# Patient Record
Sex: Female | Born: 1935 | Race: White | Hispanic: No | Smoking: Never smoker
Health system: Southern US, Community
[De-identification: ages and names within clinical notes are randomized; demographics above are authoritative.]

## PROBLEM LIST (undated history)

## (undated) DIAGNOSIS — K219 Gastro-esophageal reflux disease without esophagitis: Secondary | ICD-10-CM

## (undated) DIAGNOSIS — E785 Hyperlipidemia, unspecified: Secondary | ICD-10-CM

## (undated) DIAGNOSIS — I10 Essential (primary) hypertension: Secondary | ICD-10-CM

## (undated) DIAGNOSIS — K222 Esophageal obstruction: Secondary | ICD-10-CM

## (undated) DIAGNOSIS — Z5189 Encounter for other specified aftercare: Secondary | ICD-10-CM

## (undated) DIAGNOSIS — K5792 Diverticulitis of intestine, part unspecified, without perforation or abscess without bleeding: Secondary | ICD-10-CM

## (undated) DIAGNOSIS — E538 Deficiency of other specified B group vitamins: Secondary | ICD-10-CM

## (undated) DIAGNOSIS — B029 Zoster without complications: Secondary | ICD-10-CM

## (undated) DIAGNOSIS — I639 Cerebral infarction, unspecified: Secondary | ICD-10-CM

## (undated) HISTORY — DX: Cerebral infarction, unspecified: I63.9

## (undated) HISTORY — DX: Gastro-esophageal reflux disease without esophagitis: K21.9

## (undated) HISTORY — DX: Diverticulitis of intestine, part unspecified, without perforation or abscess without bleeding: K57.92

## (undated) HISTORY — DX: Deficiency of other specified B group vitamins: E53.8

## (undated) HISTORY — PX: APPENDECTOMY: SHX54

## (undated) HISTORY — PX: ABDOMINAL HYSTERECTOMY: SHX81

## (undated) HISTORY — DX: Esophageal obstruction: K22.2

## (undated) HISTORY — PX: TONSILLECTOMY: SUR1361

## (undated) HISTORY — DX: Zoster without complications: B02.9

## (undated) HISTORY — DX: Essential (primary) hypertension: I10

## (undated) HISTORY — DX: Hyperlipidemia, unspecified: E78.5

## (undated) HISTORY — PX: EYE SURGERY: SHX253

---

## 1998-05-13 ENCOUNTER — Ambulatory Visit (HOSPITAL_COMMUNITY): Admission: RE | Admit: 1998-05-13 | Discharge: 1998-05-13 | Payer: Self-pay | Admitting: Internal Medicine

## 1998-05-18 ENCOUNTER — Ambulatory Visit (HOSPITAL_COMMUNITY): Admission: RE | Admit: 1998-05-18 | Discharge: 1998-05-18 | Payer: Self-pay | Admitting: Internal Medicine

## 1999-08-29 ENCOUNTER — Encounter: Payer: Self-pay | Admitting: Internal Medicine

## 1999-08-29 ENCOUNTER — Encounter: Admission: RE | Admit: 1999-08-29 | Discharge: 1999-08-29 | Payer: Self-pay | Admitting: Internal Medicine

## 1999-10-10 ENCOUNTER — Encounter: Payer: Self-pay | Admitting: Internal Medicine

## 1999-10-10 ENCOUNTER — Encounter: Admission: RE | Admit: 1999-10-10 | Discharge: 1999-10-10 | Payer: Self-pay | Admitting: *Deleted

## 2004-06-13 ENCOUNTER — Ambulatory Visit: Payer: Self-pay | Admitting: Family Medicine

## 2004-06-21 ENCOUNTER — Ambulatory Visit: Payer: Self-pay | Admitting: Family Medicine

## 2004-08-09 ENCOUNTER — Ambulatory Visit: Payer: Self-pay | Admitting: Gastroenterology

## 2004-08-18 ENCOUNTER — Ambulatory Visit: Payer: Self-pay | Admitting: Gastroenterology

## 2004-08-29 ENCOUNTER — Ambulatory Visit: Payer: Self-pay | Admitting: Gastroenterology

## 2004-09-07 ENCOUNTER — Ambulatory Visit: Payer: Self-pay | Admitting: Family Medicine

## 2004-09-16 ENCOUNTER — Ambulatory Visit: Payer: Self-pay | Admitting: Family Medicine

## 2004-09-16 ENCOUNTER — Emergency Department (HOSPITAL_COMMUNITY): Admission: EM | Admit: 2004-09-16 | Discharge: 2004-09-16 | Payer: Self-pay | Admitting: *Deleted

## 2004-09-27 ENCOUNTER — Ambulatory Visit: Payer: Self-pay | Admitting: Gastroenterology

## 2004-09-28 HISTORY — PX: ESOPHAGOGASTRODUODENOSCOPY: SHX1529

## 2004-09-30 ENCOUNTER — Ambulatory Visit: Payer: Self-pay | Admitting: Gastroenterology

## 2004-10-03 ENCOUNTER — Ambulatory Visit: Payer: Self-pay | Admitting: Family Medicine

## 2005-01-09 ENCOUNTER — Emergency Department (HOSPITAL_COMMUNITY): Admission: EM | Admit: 2005-01-09 | Discharge: 2005-01-09 | Payer: Self-pay | Admitting: Emergency Medicine

## 2005-05-24 ENCOUNTER — Ambulatory Visit: Payer: Self-pay | Admitting: Family Medicine

## 2006-04-23 ENCOUNTER — Ambulatory Visit: Payer: Self-pay | Admitting: Family Medicine

## 2006-05-28 ENCOUNTER — Ambulatory Visit: Payer: Self-pay | Admitting: Family Medicine

## 2006-05-28 LAB — CONVERTED CEMR LAB
ALT: 33 units/L (ref 0–40)
AST: 29 units/L (ref 0–37)
Albumin: 3.9 g/dL (ref 3.5–5.2)
Alkaline Phosphatase: 104 units/L (ref 39–117)
BUN: 14 mg/dL (ref 6–23)
Bilirubin, Direct: 0.1 mg/dL (ref 0.0–0.3)
CO2: 32 meq/L (ref 19–32)
Calcium: 9.4 mg/dL (ref 8.4–10.5)
Chloride: 102 meq/L (ref 96–112)
Chol/HDL Ratio, serum: 2.8
Cholesterol: 209 mg/dL (ref 0–200)
Creatinine, Ser: 0.8 mg/dL (ref 0.4–1.2)
GFR calc non Af Amer: 73 mL/min
Glomerular Filtration Rate, Af Am: 89 mL/min/{1.73_m2}
Glucose, Bld: 104 mg/dL — ABNORMAL HIGH (ref 70–99)
HCT: 43.9 % (ref 36.0–46.0)
HDL: 74.4 mg/dL (ref >39.0)
Hemoglobin: 14.5 g/dL (ref 12.0–15.0)
LDL DIRECT: 98.3 mg/dL
MCHC: 33.1 g/dL (ref 30.0–36.0)
MCV: 92.8 fL (ref 78.0–100.0)
Platelets: 238 10*3/uL (ref 150–400)
Potassium: 3.6 meq/L (ref 3.5–5.1)
RBC: 4.74 M/uL (ref 3.87–5.11)
RDW: 11.8 % (ref 11.5–14.6)
Sodium: 142 meq/L (ref 135–145)
TSH: 0.85 microintl units/mL (ref 0.35–5.50)
Total Bilirubin: 1 mg/dL (ref 0.3–1.2)
Total Protein: 7.4 g/dL (ref 6.0–8.3)
Triglyceride fasting, serum: 92 mg/dL (ref 0–149)
VLDL: 18 mg/dL (ref 0–40)
WBC: 6.7 10*3/uL (ref 4.5–10.5)

## 2006-11-07 ENCOUNTER — Ambulatory Visit: Payer: Self-pay | Admitting: Family Medicine

## 2007-03-19 DIAGNOSIS — K219 Gastro-esophageal reflux disease without esophagitis: Secondary | ICD-10-CM | POA: Insufficient documentation

## 2007-03-19 DIAGNOSIS — I10 Essential (primary) hypertension: Secondary | ICD-10-CM | POA: Insufficient documentation

## 2007-05-20 ENCOUNTER — Encounter: Payer: Self-pay | Admitting: Family Medicine

## 2007-05-30 ENCOUNTER — Ambulatory Visit: Payer: Self-pay | Admitting: Family Medicine

## 2007-05-30 DIAGNOSIS — L989 Disorder of the skin and subcutaneous tissue, unspecified: Secondary | ICD-10-CM | POA: Insufficient documentation

## 2007-05-30 DIAGNOSIS — E785 Hyperlipidemia, unspecified: Secondary | ICD-10-CM | POA: Insufficient documentation

## 2007-06-03 LAB — CONVERTED CEMR LAB
ALT: 29 units/L (ref 0–35)
AST: 26 units/L (ref 0–37)
Albumin: 3.9 g/dL (ref 3.5–5.2)
Alkaline Phosphatase: 89 units/L (ref 39–117)
BUN: 14 mg/dL (ref 6–23)
Basophils Absolute: 0.1 10*3/uL (ref 0.0–0.1)
Basophils Relative: 0.8 % (ref 0.0–1.0)
Bilirubin, Direct: 0.2 mg/dL (ref 0.0–0.3)
CO2: 33 meq/L — ABNORMAL HIGH (ref 19–32)
Calcium: 10 mg/dL (ref 8.4–10.5)
Chloride: 107 meq/L (ref 96–112)
Cholesterol: 200 mg/dL (ref 0–200)
Creatinine, Ser: 0.8 mg/dL (ref 0.4–1.2)
Eosinophils Absolute: 0.1 10*3/uL (ref 0.0–0.6)
Eosinophils Relative: 1.3 % (ref 0.0–5.0)
GFR calc Af Amer: 88 mL/min
GFR calc non Af Amer: 73 mL/min
Glucose, Bld: 110 mg/dL — ABNORMAL HIGH (ref 70–99)
HCT: 39.5 % (ref 36.0–46.0)
HDL: 70.5 mg/dL (ref >39.0)
Hemoglobin: 13.6 g/dL (ref 12.0–15.0)
LDL Cholesterol: 105 mg/dL — ABNORMAL HIGH (ref 0–99)
Lymphocytes Relative: 22 % (ref 12.0–46.0)
MCHC: 34.5 g/dL (ref 30.0–36.0)
MCV: 92.6 fL (ref 78.0–100.0)
Monocytes Absolute: 0.6 10*3/uL (ref 0.2–0.7)
Monocytes Relative: 9.2 % (ref 3.0–11.0)
Neutro Abs: 4.6 10*3/uL (ref 1.4–7.7)
Neutrophils Relative %: 66.7 % (ref 43.0–77.0)
Platelets: 225 10*3/uL (ref 150–400)
Potassium: 5 meq/L (ref 3.5–5.1)
RBC: 4.27 M/uL (ref 3.87–5.11)
RDW: 11.8 % (ref 11.5–14.6)
Sodium: 147 meq/L — ABNORMAL HIGH (ref 135–145)
TSH: 1.54 microintl units/mL (ref 0.35–5.50)
Total Bilirubin: 1.2 mg/dL (ref 0.3–1.2)
Total CHOL/HDL Ratio: 2.8
Total Protein: 6.8 g/dL (ref 6.0–8.3)
Triglycerides: 122 mg/dL (ref 0–149)
VLDL: 24 mg/dL (ref 0–40)
WBC: 6.9 10*3/uL (ref 4.5–10.5)

## 2008-02-03 ENCOUNTER — Encounter: Payer: Self-pay | Admitting: Family Medicine

## 2008-02-25 ENCOUNTER — Ambulatory Visit: Payer: Self-pay | Admitting: Family Medicine

## 2008-03-12 ENCOUNTER — Ambulatory Visit: Payer: Self-pay | Admitting: Family Medicine

## 2008-03-12 DIAGNOSIS — N3000 Acute cystitis without hematuria: Secondary | ICD-10-CM | POA: Insufficient documentation

## 2008-03-12 LAB — CONVERTED CEMR LAB
Bilirubin Urine: NEGATIVE
Blood in Urine, dipstick: NEGATIVE
Glucose, Urine, Semiquant: NEGATIVE
Ketones, urine, test strip: NEGATIVE
Nitrite: NEGATIVE
Protein, U semiquant: NEGATIVE
Specific Gravity, Urine: 1.01
Urobilinogen, UA: NEGATIVE
pH: 5

## 2008-03-23 ENCOUNTER — Telehealth: Payer: Self-pay | Admitting: Family Medicine

## 2008-04-08 ENCOUNTER — Ambulatory Visit: Payer: Self-pay | Admitting: Family Medicine

## 2008-05-06 ENCOUNTER — Telehealth: Payer: Self-pay | Admitting: Family Medicine

## 2008-05-21 ENCOUNTER — Encounter: Payer: Self-pay | Admitting: Family Medicine

## 2008-06-24 ENCOUNTER — Ambulatory Visit: Payer: Self-pay | Admitting: Family Medicine

## 2008-06-24 DIAGNOSIS — K5900 Constipation, unspecified: Secondary | ICD-10-CM | POA: Insufficient documentation

## 2008-06-26 ENCOUNTER — Telehealth (INDEPENDENT_AMBULATORY_CARE_PROVIDER_SITE_OTHER): Payer: Self-pay

## 2008-11-04 ENCOUNTER — Ambulatory Visit: Payer: Self-pay | Admitting: Family Medicine

## 2008-11-04 DIAGNOSIS — IMO0001 Reserved for inherently not codable concepts without codable children: Secondary | ICD-10-CM | POA: Insufficient documentation

## 2008-11-04 DIAGNOSIS — R310 Gross hematuria: Secondary | ICD-10-CM | POA: Insufficient documentation

## 2008-11-04 LAB — CONVERTED CEMR LAB
Bilirubin Urine: NEGATIVE
Blood in Urine, dipstick: NEGATIVE
Glucose, Urine, Semiquant: NEGATIVE
Ketones, urine, test strip: NEGATIVE
Nitrite: NEGATIVE
Protein, U semiquant: NEGATIVE
Specific Gravity, Urine: 1.01
Urobilinogen, UA: 0.2
WBC Urine, dipstick: NEGATIVE
pH: 5

## 2008-11-30 DIAGNOSIS — K5730 Diverticulosis of large intestine without perforation or abscess without bleeding: Secondary | ICD-10-CM | POA: Insufficient documentation

## 2008-11-30 DIAGNOSIS — K222 Esophageal obstruction: Secondary | ICD-10-CM | POA: Insufficient documentation

## 2008-12-03 ENCOUNTER — Ambulatory Visit: Payer: Self-pay | Admitting: Gastroenterology

## 2008-12-03 DIAGNOSIS — K644 Residual hemorrhoidal skin tags: Secondary | ICD-10-CM | POA: Insufficient documentation

## 2008-12-04 ENCOUNTER — Ambulatory Visit: Payer: Self-pay | Admitting: Family Medicine

## 2008-12-04 ENCOUNTER — Telehealth: Payer: Self-pay | Admitting: Gastroenterology

## 2008-12-09 ENCOUNTER — Ambulatory Visit: Payer: Self-pay | Admitting: Gastroenterology

## 2008-12-09 HISTORY — PX: COLONOSCOPY: SHX174

## 2008-12-10 ENCOUNTER — Ambulatory Visit (HOSPITAL_COMMUNITY): Admission: RE | Admit: 2008-12-10 | Discharge: 2008-12-10 | Payer: Self-pay | Admitting: Gastroenterology

## 2008-12-31 ENCOUNTER — Encounter: Payer: Self-pay | Admitting: Gastroenterology

## 2009-02-25 ENCOUNTER — Encounter: Payer: Self-pay | Admitting: Gastroenterology

## 2009-02-25 ENCOUNTER — Encounter: Payer: Self-pay | Admitting: Family Medicine

## 2009-04-08 ENCOUNTER — Encounter: Payer: Self-pay | Admitting: Gastroenterology

## 2009-04-08 ENCOUNTER — Encounter: Payer: Self-pay | Admitting: Family Medicine

## 2009-05-24 ENCOUNTER — Encounter: Payer: Self-pay | Admitting: Family Medicine

## 2009-06-28 ENCOUNTER — Ambulatory Visit: Payer: Self-pay | Admitting: Family Medicine

## 2009-06-28 DIAGNOSIS — G609 Hereditary and idiopathic neuropathy, unspecified: Secondary | ICD-10-CM | POA: Insufficient documentation

## 2009-06-28 DIAGNOSIS — F411 Generalized anxiety disorder: Secondary | ICD-10-CM | POA: Insufficient documentation

## 2009-06-30 ENCOUNTER — Ambulatory Visit: Payer: Self-pay | Admitting: Family Medicine

## 2009-06-30 DIAGNOSIS — E538 Deficiency of other specified B group vitamins: Secondary | ICD-10-CM | POA: Insufficient documentation

## 2009-07-08 ENCOUNTER — Ambulatory Visit: Payer: Self-pay | Admitting: Family Medicine

## 2009-07-10 ENCOUNTER — Ambulatory Visit: Payer: Self-pay | Admitting: Cardiovascular Disease

## 2009-07-10 ENCOUNTER — Inpatient Hospital Stay (HOSPITAL_COMMUNITY): Admission: EM | Admit: 2009-07-10 | Discharge: 2009-07-13 | Payer: Self-pay | Admitting: Emergency Medicine

## 2009-07-11 ENCOUNTER — Encounter (INDEPENDENT_AMBULATORY_CARE_PROVIDER_SITE_OTHER): Payer: Self-pay | Admitting: Internal Medicine

## 2009-07-12 ENCOUNTER — Encounter (INDEPENDENT_AMBULATORY_CARE_PROVIDER_SITE_OTHER): Payer: Self-pay | Admitting: Internal Medicine

## 2009-07-19 ENCOUNTER — Ambulatory Visit: Payer: Self-pay | Admitting: Family Medicine

## 2009-07-19 DIAGNOSIS — I6350 Cerebral infarction due to unspecified occlusion or stenosis of unspecified cerebral artery: Secondary | ICD-10-CM | POA: Insufficient documentation

## 2009-07-19 DIAGNOSIS — N289 Disorder of kidney and ureter, unspecified: Secondary | ICD-10-CM | POA: Insufficient documentation

## 2009-07-19 DIAGNOSIS — N259 Disorder resulting from impaired renal tubular function, unspecified: Secondary | ICD-10-CM | POA: Insufficient documentation

## 2009-07-19 DIAGNOSIS — M353 Polymyalgia rheumatica: Secondary | ICD-10-CM | POA: Insufficient documentation

## 2009-07-20 ENCOUNTER — Encounter: Payer: Self-pay | Admitting: Family Medicine

## 2009-07-20 LAB — CONVERTED CEMR LAB
BUN: 25 mg/dL — ABNORMAL HIGH (ref 6–23)
CO2: 31 meq/L (ref 19–32)
Calcium: 10 mg/dL (ref 8.4–10.5)
Chloride: 96 meq/L (ref 96–112)
Creatinine, Ser: 1.1 mg/dL (ref 0.4–1.2)
GFR calc non Af Amer: 50.18 mL/min (ref >60)
Glucose, Bld: 103 mg/dL — ABNORMAL HIGH (ref 70–99)
Potassium: 5.3 meq/L — ABNORMAL HIGH (ref 3.5–5.1)
Sodium: 136 meq/L (ref 135–145)

## 2009-07-21 ENCOUNTER — Ambulatory Visit: Payer: Self-pay | Admitting: Vascular Surgery

## 2009-08-17 ENCOUNTER — Telehealth: Payer: Self-pay | Admitting: Family Medicine

## 2009-08-31 ENCOUNTER — Ambulatory Visit: Payer: Self-pay | Admitting: Family Medicine

## 2009-08-31 LAB — CONVERTED CEMR LAB
Bilirubin Urine: NEGATIVE
Blood in Urine, dipstick: NEGATIVE
Glucose, Urine, Semiquant: NEGATIVE
Ketones, urine, test strip: NEGATIVE
Nitrite: NEGATIVE
Protein, U semiquant: NEGATIVE
Specific Gravity, Urine: <1.005
Urobilinogen, UA: 0.2
pH: 5

## 2009-09-01 ENCOUNTER — Encounter: Payer: Self-pay | Admitting: Family Medicine

## 2009-09-27 ENCOUNTER — Encounter: Payer: Self-pay | Admitting: Family Medicine

## 2009-09-28 ENCOUNTER — Encounter: Payer: Self-pay | Admitting: Family Medicine

## 2009-09-29 ENCOUNTER — Telehealth: Payer: Self-pay | Admitting: Family Medicine

## 2009-10-10 ENCOUNTER — Encounter: Payer: Self-pay | Admitting: Family Medicine

## 2009-11-10 ENCOUNTER — Telehealth: Payer: Self-pay | Admitting: Family Medicine

## 2009-12-01 ENCOUNTER — Telehealth: Payer: Self-pay | Admitting: Family Medicine

## 2010-01-27 ENCOUNTER — Telehealth: Payer: Self-pay | Admitting: Family Medicine

## 2010-05-11 ENCOUNTER — Telehealth: Payer: Self-pay | Admitting: Family Medicine

## 2010-05-25 ENCOUNTER — Encounter: Payer: Self-pay | Admitting: Family Medicine

## 2010-06-30 ENCOUNTER — Encounter: Payer: Self-pay | Admitting: Family Medicine

## 2010-06-30 ENCOUNTER — Ambulatory Visit: Payer: Self-pay | Admitting: Family Medicine

## 2010-06-30 DIAGNOSIS — R7309 Other abnormal glucose: Secondary | ICD-10-CM | POA: Insufficient documentation

## 2010-07-01 LAB — CONVERTED CEMR LAB
ALT: 14 units/L (ref 0–35)
AST: 20 units/L (ref 0–37)
Albumin: 4 g/dL (ref 3.5–5.2)
Alkaline Phosphatase: 85 units/L (ref 39–117)
BUN: 13 mg/dL (ref 6–23)
Basophils Absolute: 0 10*3/uL (ref 0.0–0.1)
Basophils Relative: 0.7 % (ref 0.0–3.0)
Bilirubin, Direct: 0.1 mg/dL (ref 0.0–0.3)
CO2: 31 meq/L (ref 19–32)
Calcium: 9.4 mg/dL (ref 8.4–10.5)
Chloride: 98 meq/L (ref 96–112)
Cholesterol: 213 mg/dL — ABNORMAL HIGH (ref 0–200)
Creatinine, Ser: 0.9 mg/dL (ref 0.4–1.2)
Direct LDL: 111.4 mg/dL
Eosinophils Absolute: 0 10*3/uL (ref 0.0–0.7)
Eosinophils Relative: 0.3 % (ref 0.0–5.0)
GFR calc non Af Amer: 63.93 mL/min (ref >60)
Glucose, Bld: 100 mg/dL — ABNORMAL HIGH (ref 70–99)
HCT: 39.2 % (ref 36.0–46.0)
HDL: 74.2 mg/dL (ref >39.00)
Hemoglobin: 13.7 g/dL (ref 12.0–15.0)
Hgb A1c MFr Bld: 5.9 % (ref 4.6–6.5)
Lymphocytes Relative: 22.6 % (ref 12.0–46.0)
Lymphs Abs: 1.4 10*3/uL (ref 0.7–4.0)
MCHC: 34.8 g/dL (ref 30.0–36.0)
MCV: 92 fL (ref 78.0–100.0)
Monocytes Absolute: 0.6 10*3/uL (ref 0.1–1.0)
Monocytes Relative: 8.9 % (ref 3.0–12.0)
Neutro Abs: 4.2 10*3/uL (ref 1.4–7.7)
Neutrophils Relative %: 67.5 % (ref 43.0–77.0)
Platelets: 236 10*3/uL (ref 150.0–400.0)
Potassium: 4.4 meq/L (ref 3.5–5.1)
RBC: 4.27 M/uL (ref 3.87–5.11)
RDW: 12.7 % (ref 11.5–14.6)
Sodium: 138 meq/L (ref 135–145)
TSH: 1.33 microintl units/mL (ref 0.35–5.50)
Total Bilirubin: 1.2 mg/dL (ref 0.3–1.2)
Total CHOL/HDL Ratio: 3
Total Protein: 7 g/dL (ref 6.0–8.3)
Triglycerides: 107 mg/dL (ref 0.0–149.0)
VLDL: 21.4 mg/dL (ref 0.0–40.0)
Vitamin B-12: 601 pg/mL (ref 211–911)
WBC: 6.3 10*3/uL (ref 4.5–10.5)

## 2010-08-03 ENCOUNTER — Telehealth: Payer: Self-pay | Admitting: Family Medicine

## 2010-08-28 LAB — CONVERTED CEMR LAB
ALT: 21 units/L (ref 0–35)
ALT: 24 units/L (ref 0–35)
AST: 26 units/L (ref 0–37)
AST: 26 units/L (ref 0–37)
Albumin: 3.7 g/dL (ref 3.5–5.2)
Albumin: 3.8 g/dL (ref 3.5–5.2)
Alkaline Phosphatase: 81 units/L (ref 39–117)
Alkaline Phosphatase: 95 units/L (ref 39–117)
BUN: 13 mg/dL (ref 6–23)
BUN: 14 mg/dL (ref 6–23)
Basophils Absolute: 0 10*3/uL (ref 0.0–0.1)
Basophils Absolute: 0.1 10*3/uL (ref 0.0–0.1)
Basophils Relative: 0.8 % (ref 0.0–3.0)
Basophils Relative: 1.1 % (ref 0.0–3.0)
Bilirubin, Direct: 0 mg/dL (ref 0.0–0.3)
Bilirubin, Direct: 0.1 mg/dL (ref 0.0–0.3)
CO2: 32 meq/L (ref 19–32)
CO2: 33 meq/L — ABNORMAL HIGH (ref 19–32)
Calcium: 9.3 mg/dL (ref 8.4–10.5)
Calcium: 9.4 mg/dL (ref 8.4–10.5)
Chloride: 101 meq/L (ref 96–112)
Chloride: 104 meq/L (ref 96–112)
Cholesterol: 241 mg/dL (ref 0–200)
Cholesterol: 292 mg/dL — ABNORMAL HIGH (ref 0–200)
Creatinine, Ser: 0.8 mg/dL (ref 0.4–1.2)
Creatinine, Ser: 0.9 mg/dL (ref 0.4–1.2)
Direct LDL: 114.9 mg/dL
Direct LDL: 194.4 mg/dL
Eosinophils Absolute: 0.1 10*3/uL (ref 0.0–0.7)
Eosinophils Absolute: 0.1 10*3/uL (ref 0.0–0.7)
Eosinophils Relative: 1.2 % (ref 0.0–5.0)
Eosinophils Relative: 1.7 % (ref 0.0–5.0)
GFR calc Af Amer: 88 mL/min
GFR calc non Af Amer: 63.26 mL/min (ref >60)
GFR calc non Af Amer: 73 mL/min
Glucose, Bld: 106 mg/dL — ABNORMAL HIGH (ref 70–99)
Glucose, Bld: 109 mg/dL — ABNORMAL HIGH (ref 70–99)
HCT: 39.9 % (ref 36.0–46.0)
HCT: 41.8 % (ref 36.0–46.0)
HDL: 70.2 mg/dL (ref >39.00)
HDL: 81.1 mg/dL (ref >39.0)
Hemoglobin: 13.7 g/dL (ref 12.0–15.0)
Hemoglobin: 13.8 g/dL (ref 12.0–15.0)
Lymphocytes Relative: 23.6 % (ref 12.0–46.0)
Lymphocytes Relative: 25.6 % (ref 12.0–46.0)
Lymphs Abs: 1.4 10*3/uL (ref 0.7–4.0)
MCHC: 33 g/dL (ref 30.0–36.0)
MCHC: 34.3 g/dL (ref 30.0–36.0)
MCV: 94 fL (ref 78.0–100.0)
MCV: 95.2 fL (ref 78.0–100.0)
Monocytes Absolute: 0.5 10*3/uL (ref 0.1–1.0)
Monocytes Absolute: 0.6 10*3/uL (ref 0.1–1.0)
Monocytes Relative: 8.8 % (ref 3.0–12.0)
Monocytes Relative: 9.6 % (ref 3.0–12.0)
Neutro Abs: 3.8 10*3/uL (ref 1.4–7.7)
Neutro Abs: 4 10*3/uL (ref 1.4–7.7)
Neutrophils Relative %: 62.3 % (ref 43.0–77.0)
Neutrophils Relative %: 65.3 % (ref 43.0–77.0)
Platelets: 228 10*3/uL (ref 150–400)
Platelets: 229 10*3/uL (ref 150.0–400.0)
Potassium: 3.7 meq/L (ref 3.5–5.1)
Potassium: 3.9 meq/L (ref 3.5–5.1)
RBC: 4.24 M/uL (ref 3.87–5.11)
RBC: 4.39 M/uL (ref 3.87–5.11)
RDW: 11.7 % (ref 11.5–14.6)
RDW: 11.8 % (ref 11.5–14.6)
Sodium: 141 meq/L (ref 135–145)
Sodium: 144 meq/L (ref 135–145)
TSH: 0.96 microintl units/mL (ref 0.35–5.50)
TSH: 1.07 microintl units/mL (ref 0.35–5.50)
Total Bilirubin: 1.1 mg/dL (ref 0.3–1.2)
Total Bilirubin: 1.3 mg/dL — ABNORMAL HIGH (ref 0.3–1.2)
Total CHOL/HDL Ratio: 3
Total CHOL/HDL Ratio: 4
Total Protein: 7.3 g/dL (ref 6.0–8.3)
Total Protein: 7.3 g/dL (ref 6.0–8.3)
Triglycerides: 128 mg/dL (ref 0.0–149.0)
Triglycerides: 142 mg/dL (ref 0–149)
VLDL: 25.6 mg/dL (ref 0.0–40.0)
VLDL: 28 mg/dL (ref 0–40)
Vitamin B-12: 206 pg/mL — ABNORMAL LOW (ref 211–911)
WBC: 6.1 10*3/uL (ref 4.5–10.5)
WBC: 6.1 10*3/uL (ref 4.5–10.5)

## 2010-08-30 NOTE — Assessment & Plan Note (Signed)
Summary: hosp fup//ccm   Vital Signs:  Patient profile:   77 year old female Weight:      144 pounds Temp:     97.6 degrees F oral Pulse rate:   66 / minute BP sitting:   132 / 84  (left arm) Cuff size:   regular  Vitals Entered By: Alfred Levins, CMA (July 19, 2009 10:12 AM) CC: hosp f/u   History of Present Illness: Here to follow up a hospital stay from 07-10-09 to 07-13-09 for an acute right posterior cerebral artery stroke. This presented as left sided hemianopsia, which persists. She had no other neurologic deficits. She wast switched from aspirin to Plavix. Her MRI showed the infarct, but her MRA and ECHO and carotid dopplers were all normal. She was strted on a statin. Her ARB was held briefly while she was hydrated for an acute renal insufficiency which quickly resolved. Today she feels fine with her only residual symptom being the hemianopsia. She had ben started on Prednisone for shoulder and neck pains that were diagnosed as polymyalgia rheumatica. These pains have gone away, and she would like to try coming off the Prednisone if possible.   Current Medications (verified): 1)  Plavix 75 Mg Tabs (Clopidogrel Bisulfate) .... Take 1 Tab By Mouth Daily 2)  Hydrochlorothiazide 25 Mg  Tabs (Hydrochlorothiazide) .Marland Kitchen.. 1 By Mouth Once Daily 3)  Protonix 40 Mg  Pack (Pantoprazole Sodium) .Marland Kitchen.. 1 By Mouth Once Daily 4)  Atenolol 100 Mg Tabs (Atenolol) .Marland Kitchen.. 1 By Mouth Once Daily 5)  Avapro 300 Mg Tabs (Irbesartan) .... Once Daily 6)  Lorazepam 0.5 Mg Tabs (Lorazepam) .Marland Kitchen.. 1 Q 6 Hours As Needed Anxiety 7)  Cyanocobalamin 1000 Mcg/ml Soln (Cyanocobalamin) .... Inject Weekly 8)  Syringe 2-3 Ml 3 Ml Misc (Syringe (Disposable)) .... Use As Directed 9)  Bd Disp Needle 25g X 1" Misc (Needle (Disp)) .... Use As Directed 10)  Simvastatin 20 Mg Tabs (Simvastatin) .Marland Kitchen.. 1 By Mouth At Bedtime 11)  Prednisone 20 Mg Tabs (Prednisone) .... Once Daily  Allergies (verified): 1)  ! Macrobid 2)  !  Lisinopril 3)  ! Amoxicillin  Past History:  Past Medical History: Reviewed history from 06/28/2009 and no changes required. High Cholesterol Shingles GERD Hypertension diverticulitis esophageal strictures internal and external hemmorrhoids, per Dr. Derrell Lolling. Gets injections about once a year. vertigo  Past Surgical History: Reviewed history from 06/28/2009 and no changes required. Appendectomy Hysterectomy Tonsillectomy colonoscopy 12-09-08  per Dr. Jarold Motto (incomplete) along with barium enema 12-10-08 (hemorrhoids and tics) EGD with dilatation 3-06 per Dr. Jarold Motto benign lesion removed from left face per Dr. Lonni Fix Cataract extraction per Dr. Dione Booze  Review of Systems  The patient denies anorexia, fever, weight loss, weight gain, vision loss, decreased hearing, hoarseness, chest pain, syncope, dyspnea on exertion, peripheral edema, prolonged cough, headaches, hemoptysis, abdominal pain, melena, hematochezia, severe indigestion/heartburn, hematuria, incontinence, genital sores, muscle weakness, suspicious skin lesions, transient blindness, difficulty walking, depression, unusual weight change, abnormal bleeding, enlarged lymph nodes, angioedema, breast masses, and testicular masses.    Physical Exam  General:  Well-developed,well-nourished,in no acute distress; alert,appropriate and cooperative throughout examination Neck:  No deformities, masses, or tenderness noted. Lungs:  Normal respiratory effort, chest expands symmetrically. Lungs are clear to auscultation, no crackles or wheezes. Heart:  Normal rate and regular rhythm. S1 and S2 normal without gallop, murmur, click, rub or other extra sounds.   Impression & Recommendations:  Problem # 1:  CEREBROVASCULAR ACCIDENT (ICD-434.91)  Her updated medication list for  this problem includes:    Plavix 75 Mg Tabs (Clopidogrel bisulfate) .Marland Kitchen... Take 1 tab by mouth daily  Orders: Neurology Referral (Neuro)  Problem # 2:  RENAL  INSUFFICIENCY (ICD-588.9)  Orders: Venipuncture (04540) TLB-BMP (Basic Metabolic Panel-BMET) (80048-METABOL)  Problem # 3:  POLYMYALGIA RHEUMATICA (ICD-725)  The following medications were removed from the medication list:    Prednisone 20 Mg Tabs (Prednisone) ..... Once daily Her updated medication list for this problem includes:    Prednisone 10 Mg Tabs (Prednisone) .Marland Kitchen... As directed  Complete Medication List: 1)  Plavix 75 Mg Tabs (Clopidogrel bisulfate) .... Take 1 tab by mouth daily 2)  Hydrochlorothiazide 25 Mg Tabs (Hydrochlorothiazide) .Marland Kitchen.. 1 by mouth once daily 3)  Protonix 40 Mg Pack (Pantoprazole sodium) .Marland Kitchen.. 1 by mouth once daily 4)  Atenolol 100 Mg Tabs (Atenolol) .Marland Kitchen.. 1 by mouth once daily 5)  Avapro 300 Mg Tabs (Irbesartan) .... Once daily 6)  Lorazepam 0.5 Mg Tabs (Lorazepam) .Marland Kitchen.. 1 q 6 hours as needed anxiety 7)  Cyanocobalamin 1000 Mcg/ml Soln (Cyanocobalamin) .... Inject weekly 8)  Syringe 2-3 Ml 3 Ml Misc (Syringe (disposable)) .... Use as directed 9)  Bd Disp Needle 25g X 1" Misc (Needle (disp)) .... Use as directed 10)  Simvastatin 20 Mg Tabs (Simvastatin) .Marland Kitchen.. 1 by mouth at bedtime 11)  Prednisone 10 Mg Tabs (Prednisone) .... As directed  Patient Instructions: 1)  She seems to be quite stable from her recent stroke. No embolic sources were identified. We will remain on Plavix. She will follow up with Dr. Dione Booze (her ophthalmologist) and Dr. Sharene Skeans (neuology) in the next 3-4 weeks. Check another BMET today.  Prescriptions: PREDNISONE 10 MG TABS (PREDNISONE) as directed  #30 x 0   Entered and Authorized by:   Nelwyn Salisbury MD   Signed by:   Nelwyn Salisbury MD on 07/19/2009   Method used:   [facility]  [address] [phone][facility] ([facility]ail)       [address]       [place], [place]  [address]       Ph: [phone]       Fax: [phone]   RxID:   [id number]

## 2010-08-30 NOTE — Procedures (Signed)
Summary: [facility]   Imported By: Maryln Gottron 11/15/2009 13:50:45  _____________________________________________________________________  External Attachment:    Type:   Image     Comment:   External Document

## 2010-08-30 NOTE — Progress Notes (Signed)
Summary: new rxs needed  Phone Note Call from Patient Call back at Home Phone [phone]   Caller: Patient-live call Summary of Call: needs new rx for plavix and simvastatin. [facility] [address]. Was rx'd these while in hospital. Initial call taken by: Warnell Forester,  August 17, 2009 4:02 PM  Follow-up for Phone Call        Phone Call Completed, Rx Called In Follow-up by: Alfred Levins, CMA,  August 17, 2009 4:17 PM    Prescriptions: SIMVASTATIN 20 MG TABS (SIMVASTATIN) 1 by mouth at bedtime  #30 x 11   Entered by:   Alfred Levins, CMA   Authorized by:   Nelwyn Salisbury MD   Signed by:   Alfred Levins, CMA on 08/17/2009   Method used:   [facility]        [facility]  [address] [phone][facility] (retail)       [address]       [place], [place]  [address]       Ph: [phone]       Fax: [phone]   RxID:   [id number] PLAVIX 75 MG TABS (CLOPIDOGREL BISULFATE) Take 1 tab by mouth daily  #30 x 5   Entered by:   Alfred Levins, CMA   Authorized by:   Nelwyn Salisbury MD   Signed by:   Alfred Levins, CMA on 08/17/2009   Method used:   [facility]        [facility]  [address] [phone]* (retail)       [address]       [place], [place]  [address]       Ph: [phone]       Fax: [phone]   RxID:   [id number]

## 2010-08-30 NOTE — Assessment & Plan Note (Signed)
Summary: B-12INJ/RCD  Medications Added CYANOCOBALAMIN 1000 MCG/ML SOLN (CYANOCOBALAMIN) inject weekly SYRINGE 2-3 ML 3 ML MISC (SYRINGE (DISPOSABLE)) use as directed BD DISP NEEDLE 25G X 1" MISC (NEEDLE (DISP)) use as directed       Nurse Visit   Allergies: 1)  ! Macrobid 2)  ! Lisinopril 3)  ! Amoxicillin  Medication Administration  Injection # 1:    Medication: Vit B12 1000 mcg    Diagnosis: VITAMIN B12 DEFICIENCY (ICD-266.2)    Route: IM    Site: R deltoid    Exp Date: 8/12    Lot #: [id number]    Mfr: [employer] Regent    Patient tolerated injection without complications    Given by: Alfred Levins, CMA (July 08, 2009 10:49 AM)  Orders Added: 1)  Vit B12 1000 mcg [J3420] 2)  Admin of Therapeutic Inj  intramuscular or subcutaneous [96372] Prescriptions: BD DISP NEEDLE 25G X 1" MISC (NEEDLE (DISP)) use as directed  #30 x 0   Entered by:   Alfred Levins, CMA   Authorized by:   Nelwyn Salisbury MD   Signed by:   Alfred Levins, CMA on 07/08/2009   Method used:   [facility]        [facility]  [address] [phone][facility] ([facility]ail)       [address]       [place], [place]  [address]       Ph: [phone]       Fax: [phone]   RxID:   [id number] SYRINGE 2-3 ML 3 ML MISC (SYRINGE (DISPOSABLE)) use as directed  #30 x 0   Entered by:   Alfred Levins, CMA   Authorized by:   Nelwyn Salisbury MD   Signed by:   Alfred Levins, CMA on 07/08/2009   Method used:   [facility]        [facility]  [address] [phone]* (retail)       [address]       [place], [place]  [address]       Ph: [phone]       Fax: [phone]   RxID:   [id number] CYANOCOBALAMIN 1000 MCG/ML SOLN (CYANOCOBALAMIN) inject weekly  #17mL x 0   Entered by:   Alfred Levins, CMA   Authorized by:   Nelwyn Salisbury MD   Signed by:   Alfred Levins, CMA on 07/08/2009   Method used:   [facility]        [facility]  [address] [phone]* (retail)       [address]       [place], [place]  [address]       Ph: [phone]       Fax: [phone]   RxID:   [id number]

## 2010-08-30 NOTE — Progress Notes (Signed)
Summary: cycocobalalmin on back order   Phone Note From Pharmacy   [facility]  [address] [id number] Summary of Call: per pharmacy cyancobalamin injectionable areon back order . Is there an alternative? Initial call taken by: Pura Spice, RN,  May 11, 2010 11:17 AM  Follow-up for Phone Call        I would suggest she get her shots here until it is available at her pharmacy again Follow-up by: Nelwyn Salisbury MD,  May 11, 2010 1:28 PM  Additional Follow-up for Phone Call Additional follow up Details #1::        pt notified wants to know if ok to leave off til see you in dec 2011 Additional Follow-up by: Pura Spice, RN,  May 11, 2010 1:42 PM    Additional Follow-up for Phone Call Additional follow up Details #2::    that would be okay  Follow-up by: Nelwyn Salisbury MD,  May 11, 2010 2:08 PM  Additional Follow-up for Phone Call Additional follow up Details #3:: Details for Additional Follow-up Action Taken: pt aware. Additional Follow-up by: Pura Spice, RN,  May 11, 2010 3:23 PM

## 2010-08-30 NOTE — Letter (Signed)
Summary: [facility]   Imported By: Maryln Gottron 03/17/2009 13:01:49  _____________________________________________________________________  External Attachment:    Type:   Image     Comment:   External Document

## 2010-08-30 NOTE — Assessment & Plan Note (Signed)
Summary: external trombos hemorroids.,.em    History of Present Illness Visit Type: Initial Consult Primary GI MD: Sheryn Bison MD FACP FAGA Primary Provider: Gershon Crane, MD Chief Complaint: hemmorhoids History of Present Illness:   This patient is an 77 year old white female with chronic diverticulosis with alternating diarrhea and constipation, gas and bloating. She does use a large amount of sorbitol and fructose with daily packs of diet gum. Her last colonoscopy was 7-8 years ago and showed mixed internal and external hemorrhoids. She has had several hemorrhoid injections by Dr. Orson Slick and Dr. Derrell Lolling last performed in October.  She continues to have rectal pain and rectal bleeding. She denies any symptoms of anemia or systemic illness. In fact she is extremely healthy for age but does take daily protonic for GERD which is well controlled and also has some mild central hypertension and is on HCTZ and atenolol. She also takes daily Avapro 150 mg. Her appetite is good and her weight is stable. She has had previous multiple surgeries include an appendectomy and hysterectomy. Family history is negative terms of colon cancer.   GI Review of Systems    Reports acid reflux.      Denies abdominal pain, belching, bloating, chest pain, dysphagia with liquids, dysphagia with solids, heartburn, loss of appetite, nausea, vomiting, vomiting blood, weight loss, and  weight gain.      Reports diverticulosis and  rectal bleeding.     Denies anal fissure, black tarry stools, change in bowel habit, constipation, diarrhea, fecal incontinence, heme positive stool, hemorrhoids, irritable bowel syndrome, jaundice, light color stool, liver problems, and  rectal pain.    Current Medications (verified): 1)  Adult Aspirin Low Strength 81 Mg  Tbdp (Aspirin) .Marland Kitchen.. 1 By Mouth Once Daily 2)  Hydrochlorothiazide 25 Mg  Tabs (Hydrochlorothiazide) .Marland Kitchen.. 1 By Mouth Once Daily 3)  Protonix 40 Mg  Pack (Pantoprazole  Sodium) .Marland Kitchen.. 1 By Mouth Once Daily 4)  Atenolol 100 Mg Tabs (Atenolol) .Marland Kitchen.. 1 By Mouth Once Daily 5)  Avapro 150 Mg Tabs (Irbesartan) .... Once Daily  Allergies (verified): 1)  ! Macrobid 2)  ! Lisinopril 3)  ! Amoxicillin  Past History:  Past medical, surgical, family and social histories (including risk factors) reviewed, and no changes noted (except as noted below).  Past Medical History:    Reviewed history from 11/04/2008 and no changes required:    High Cholesterol    Shingles    GERD    Hypertension    diverticulitis    esophageal strictures    hemmorrhoids, per Dr. Derrell Lolling. Gets injections about once a year.    vertigo  Past Surgical History:    Reviewed history from 11/04/2008 and no changes required:    Appendectomy    Hysterectomy    Tonsillectomy    colonoscopy 8-03 per Dr. Jarold Motto    EGD with dilatation 3-06 per Dr. Jarold Motto    benign lesion removed from left face per Dr. Lonni Fix    Cataract extraction per Dr. Dione Booze  Family History:    Reviewed history from 03/19/2007 and no changes required:       Family History Breast cancer 1st degree relative <50       Family History Diabetes 1st degree relative       Family History Hypertension       Family History of Stroke M 1st degree relative <50       Family History of Cardiovascular disorder  Social History:    Reviewed history from 03/19/2007 and  no changes required:       Retired       Married       Never Smoked       Alcohol use-no       Drug use-no       Regular exercise-yes  Review of Systems       The patient complains of arthritis/joint pain.  The patient denies allergy/sinus, anemia, anxiety-new, back pain, blood in urine, breast changes/lumps, change in vision, confusion, cough, coughing up blood, depression-new, fainting, fatigue, fever, headaches-new, hearing problems, heart murmur, heart rhythm changes, itching, menstrual pain, muscle pains/cramps, night sweats, nosebleeds, pregnancy symptoms,  shortness of breath, skin rash, sleeping problems, sore throat, swelling of feet/legs, swollen lymph glands, thirst - excessive , urination - excessive , urination changes/pain, urine leakage, vision changes, and voice change.    Vital Signs:  Patient profile:   77 year old female Height:      62 inches Weight:      151 pounds Pulse rate:   68 / minute Pulse rhythm:   regular BP sitting:   146 / 84  (left arm)  Vitals Entered By: June McMurray CMA (Dec 03, 2008 9:23 AM)  Physical Exam  General:  Well developed, well nourished, no acute distress.healthy appearing.   Head:  Normocephalic and atraumatic. Eyes:  PERRLA, no icterus.exam deferred to patient's ophthalmologist.   Neck:  Supple; no masses or thyromegaly. Lungs:  Clear throughout to auscultation. Heart:  Regular rate and rhythm; no murmurs, rubs,  or bruits. Abdomen:  Soft, nontender and nondistended. No masses, hepatosplenomegaly or hernias noted. Normal bowel sounds. Rectal:  Inspection shows a large posterior hemorrhoid with some heme and acute inflammation. I did not perform digital exam of the rectum. Pulses:  Normal pulses noted. Extremities:  No clubbing, cyanosis, edema or deformities noted. Neurologic:  Alert and  oriented x4;  grossly normal neurologically. Cervical Nodes:  No significant cervical adenopathy. Inguinal Nodes:  No significant inguinal adenopathy. Psych:  Alert and cooperative. Normal mood and affect.   Impression & Recommendations:  Problem # 1:  DIVERTICULOSIS, COLON (ICD-562.10) Assessment Unchanged  She does follow a high-fiber diet and I think a lot of her bowel dysfunction is from malabsorption of sorbitol and fructose. I have asked her to remove these nonabsorbable carbohydrates from her diet, and will see as she does symptomatically.  Orders: Colonoscopy (Colon)  Problem # 2:  HEMORRHOIDS-EXTERNAL (ICD-455.3) Assessment: Deteriorated  she may have mixed hemorrhoids and may have a  prolapsed internal hemorrhoid that is amenable to injection therapy. I have scheduled her followup colonoscopy and proceed accordingly. I suspect however she may need a hemorrhoidectomy performed. I've explained to her the use of Sitz baths b.i.d. as tolerated with local Analmantle cream locally to her hemorrhoids.  Orders: Colonoscopy (Colon)  Problem # 3:  GERD (ICD-530.81) Assessment: Improved continue reflux regime and daily protonic therapy.  Patient Instructions: 1)  Copy sent to : Dr. Gershon Crane 2)  Please continue current medications.  3)  Constipation and Hemorrhoids brochure given.  4)  Colonoscopy and Flexible Sigmoidoscopy brochure given.  5)  Conscious Sedation brochure given.  6)  Local hemorrhoidal care 7)  Stop oral intake of sorbitol and fructose. Prescriptions: ANAMANTLE HC 3-0.5 % CREA (LIDOCAINE-HYDROCORTISONE ACE) Apply to rectum two times a day  #30 grams x 0   Entered by:   Hortense Ramal CMA   Authorized by:   Mardella Layman MD [facility]   Signed by:  Hortense Ramal CMA on 12/03/2008   Method used:   [id number]       [address]       [place], [place]  [address]       Ph: [phone] or [phone]       Fax: [phone]   RxID:   [id number] MOVIPREP 100 GM  SOLR (PEG-KCL-NACL-NASULF-NA ASC-C) As per prep instructions.  #1 x 0   Entered by:   Hortense Ramal CMA   Authorized by:   Mardella Layman MD [facility]   Signed by:   Mardella Layman MD FACG,FAGA on 12/03/2008   Method used:   Historical   RxID:   [id number]

## 2010-08-30 NOTE — Letter (Signed)
Summary: [facility]   Imported By: Maryln Gottron 04/20/2009 10:50:57  _____________________________________________________________________  External Attachment:    Type:   Image     Comment:   External Document

## 2010-08-30 NOTE — Assessment & Plan Note (Signed)
Summary: 1 MNTH ROV//SLM   Vital Signs:  Patient profile:   77 year old female Weight:      150 pounds Temp:     98.1 degrees F oral Pulse rate:   71 / minute BP sitting:   148 / 88  (left arm) Cuff size:   regular  Vitals Entered By: Alfred Levins, CMA (Dec 04, 2008 10:07 AM) CC: f/u, hemorrhoids   History of Present Illness: Here to follow up on myalgias. One month ago we felt these may in part be from the Lipitor she was taking. She stopped this, and in fact she feels much better now. Less aching, and she has much more use of her arms. She is due for colonoscopy soon.   Allergies: 1)  ! Macrobid 2)  ! Lisinopril 3)  ! Amoxicillin  Past History:  Past Medical History:    Reviewed history from 11/04/2008 and no changes required:    High Cholesterol    Shingles    GERD    Hypertension    diverticulitis    esophageal strictures    hemmorrhoids, per Dr. Derrell Lolling. Gets injections about once a year.    vertigo  Review of Systems  The patient denies anorexia, fever, weight loss, weight gain, vision loss, decreased hearing, hoarseness, chest pain, syncope, dyspnea on exertion, peripheral edema, prolonged cough, headaches, hemoptysis, abdominal pain, melena, hematochezia, severe indigestion/heartburn, hematuria, incontinence, genital sores, muscle weakness, suspicious skin lesions, transient blindness, difficulty walking, depression, unusual weight change, abnormal bleeding, enlarged lymph nodes, angioedema, breast masses, and testicular masses.    Physical Exam  General:  Well-developed,well-nourished,in no acute distress; alert,appropriate and cooperative throughout examination   Impression & Recommendations:  Problem # 1:  MYALGIA (ICD-729.1)  Her updated medication list for this problem includes:    Adult Aspirin Low Strength 81 Mg Tbdp (Aspirin) .Marland Kitchen... 1 by mouth once daily  Problem # 2:  HYPERLIPIDEMIA (ICD-272.4)  Complete Medication List: 1)  Adult Aspirin  Low Strength 81 Mg Tbdp (Aspirin) .Marland Kitchen.. 1 by mouth once daily 2)  Hydrochlorothiazide 25 Mg Tabs (Hydrochlorothiazide) .Marland Kitchen.. 1 by mouth once daily 3)  Protonix 40 Mg Pack (Pantoprazole sodium) .Marland Kitchen.. 1 by mouth once daily 4)  Atenolol 100 Mg Tabs (Atenolol) .Marland Kitchen.. 1 by mouth once daily 5)  Avapro 150 Mg Tabs (Irbesartan) .... Once daily 6)  Moviprep 100 Gm Solr (Peg-kcl-nacl-nasulf-na asc-c) .... As per prep instructions. 7)  Anamantle Hc 3-0.5 % Crea (Lidocaine-hydrocortisone ace) .... Apply to rectum two times a day  Patient Instructions: 1)  Stray off Lipitor. we'll recheck lipids at her [facility]px this fall.

## 2010-08-30 NOTE — Letter (Signed)
Summary: [facility]   Imported By: Maryln Gottron 09/06/2009 10:52:38  _____________________________________________________________________  External Attachment:    Type:   Image     Comment:   External Document

## 2010-08-30 NOTE — Medication Information (Signed)
Summary: Prior Authorization for Avapro  Prior Authorization for Avapro   Imported By: Maryln Gottron 10/01/2009 09:49:23  _____________________________________________________________________  External Attachment:    Type:   Image     Comment:   External Document

## 2010-08-30 NOTE — Letter (Signed)
Summary: Office Note / [facility]  Office Note / [facility]   Imported By: Lennie Odor 03/23/2009 13:51:20  _____________________________________________________________________  External Attachment:    Type:   Image     Comment:   External Document

## 2010-08-30 NOTE — Procedures (Signed)
Summary: Colonoscopy   EGD  Procedure date:  08/29/2004  Findings:      Location: [facility]    Patient Name: Christine Middleton, Christine Middleton MRN:  Procedure Procedures: Colonoscopy CPT: 3024383832.  Personnel: Endoscopist: Vania Rea. Jarold Motto, MD.  Exam Location: Exam performed in Outpatient Clinic. Outpatient  Patient Consent: Procedure, Alternatives, Risks and Benefits discussed, consent obtained, from patient. Consent was obtained by the RN.  Indications Symptoms: Hematochezia.  History  Current Medications: Patient is not currently taking Coumadin.  Pre-Exam Physical: Performed Aug 29, 2004. Cardio-pulmonary exam, Rectal exam, Abdominal exam, Extremity exam, Mental status exam WNL.  Exam Exam: Extent of exam reached: Cecum, extent intended: Cecum.  The cecum was identified by appendiceal orifice and IC valve. Patient position: on left side. Duration of exam: 25 minutes. Colon retroflexion performed. Images taken. ASA Classification: II. Tolerance: good.  Monitoring: Pulse and BP monitoring, Oximetry used. Supplemental O2 given. at 2 Liters.  Colon Prep Used Golytely for colon prep. Prep results: good.  Sedation Meds: Patient assessed and found to be appropriate for moderate (conscious) sedation. Fentanyl 100 mcg. given IV. Versed 10 mg. given IV.  Instrument(s): CF 140L. Serial [id number].  Findings - NORMAL EXAM: Cecum to Splenic Flexure. Not Seen: Polyps. AVM's. Colitis. Tumors. Crohn's. Diverticulosis.  - DIVERTICULOSIS: Descending Colon to Sigmoid Colon. Not bleeding. ICD9: Diverticulosis, Colon: 562.10. Comments: Severe tortuosity and spasm...very hard abrupt turns and redundancy noted.  - HEMORRHOIDS: Internal and External. Size: Grade IV. Not bleeding. Not thrombosed. ICD9: Hemorrhoids, Internal and  External: 455.6. Comments: Large vascular prolapsing hemorrhoids.   Assessment  Diagnoses: 562.10: Diverticulosis, Colon.  455.6: Hemorrhoids, Internal  and  External.   Events  Unplanned Interventions: No intervention was required.  Plans Medication Plan: Referring provider to order medications.  Patient Education: Patient given standard instructions for: Diverticulosis. Hemorrhoids.  Disposition: After procedure patient sent to recovery. After recovery patient sent home.  Comments: She may need hemorrhoidectomy procedure here.   CC: Gershon Crane, MD     Sheppard Plumber. Earlene Plater, MD  This report was created from the original endoscopy report, which was reviewed and signed by the above listed endoscopist.

## 2010-08-30 NOTE — Progress Notes (Signed)
Summary: reill Lorazepam  Phone Note Refill Request Message from:  Fax from Pharmacy on January 27, 2010 11:26 AM  Refills Requested: Medication #1:  LORAZEPAM 0.5 MG TABS 1 q 6 hours as needed anxiety   Dosage confirmed as above?Dosage Confirmed   Supply Requested: 1 month   Last Refilled: 11/27/2009  Method Requested: Fax to Local Pharmacy Initial call taken by: Raechel Ache, RN,  January 27, 2010 11:27 AM [facility]er: [facility]  [address] [phone]*  Follow-up for Phone Call        call in #60 with 5 rf Follow-up by: Nelwyn Salisbury MD,  January 27, 2010 12:00 PM  Additional Follow-up for Phone Call Additional follow up Details #1::        Rx faxed to pharmacy Additional Follow-up by: Raechel Ache, RN,  January 27, 2010 12:06 PM    Prescriptions: LORAZEPAM 0.5 MG TABS (LORAZEPAM) 1 q 6 hours as needed anxiety  #60 x 5   Entered by:   Raechel Ache, RN   Authorized by:   Nelwyn Salisbury MD   Signed by:   Raechel Ache, RN on 01/27/2010   Method used:   Historical   RxID:   [id number]

## 2010-08-30 NOTE — Progress Notes (Signed)
Summary: change to another bp med  Phone Note Call from Patient Call back at [facility] Phone [phone]   Caller: Patient Call For: dr fry Summary of Call: pt said amlodipine besylate 5 mg  is making her feet swell she would like to switch to something else. [facility] [address] [phone] Initial call taken by: Heron Sabins,  March 23, 2008 2:44 PM  Follow-up for Phone Call        stop Norvasc and call in Clonidine 0.1 mg two times a day , #60. See me in 2 weeks Follow-up by: Nelwyn Salisbury MD,  March 23, 2008 4:44 PM  Additional Follow-up for Phone Call Additional follow up Details #1::        pt aware Additional Follow-up by: Alfred Levins, CMA,  March 24, 2008 8:12 AM    New/Updated Medications: CLONIDINE HCL 0.1 MG TABS (CLONIDINE HCL) 1 by mouth two times a day   Prescriptions: CLONIDINE HCL 0.1 MG TABS (CLONIDINE HCL) 1 by mouth two times a day  #60 x 0   Entered by:   Alfred Levins, CMA   Authorized by:   Nelwyn Salisbury MD   Signed by:   Alfred Levins, CMA on 03/24/2008   Method used:   [facility]        [facility]  [address] [phone]* (retail)       [address]       [place], [place]  [address]       Ph: [phone] or [phone]       Fax: [phone]   RxID:   [id number]

## 2010-08-30 NOTE — Assessment & Plan Note (Signed)
Summary: vit b12 inj/cdw   Nurse Visit   Allergies: 1)  ! Macrobid 2)  ! Lisinopril 3)  ! Amoxicillin  Medication Administration  Injection # 1:    Medication: Vit B12 1000 mcg    Diagnosis: VITAMIN B12 DEFICIENCY (ICD-266.2)    Route: IM    Site: L deltoid    Exp Date: 8/12    Lot #: [id number]    Mfr: [employer] Regent    Patient tolerated injection without complications    Given by: Alfred Levins, CMA (June 30, 2009 1:18 PM)  Orders Added: 1)  Vit B12 1000 mcg [J3420] 2)  Admin of Therapeutic Inj  intramuscular or subcutaneous [78295]

## 2010-08-30 NOTE — Letter (Signed)
Summary: Office Visit / [facility]  Office Visit / [facility]   Imported By: Lennie Odor 04/27/2009 15:03:29  _____________________________________________________________________  External Attachment:    Type:   Image     Comment:   External Document

## 2010-08-30 NOTE — Progress Notes (Signed)
Summary: new rx for avapro  Phone Note Call from Patient Call back at [phone]   Caller: pt live Call For: Clent Ridges Summary of Call: Patient was givin samples avapro 150mg , and they work and she would like new rx for avapro150mg .  [facility] [address] Initial call taken by: Celine Ahr,  May 06, 2008 11:03 AM  Follow-up for Phone Call        Phone Call Completed, Rx Called In Follow-up by: Alfred Levins, CMA,  May 06, 2008 11:24 AM      Prescriptions: AVAPRO 150 MG TABS (IRBESARTAN) once daily  #30 x 11   Entered by:   Alfred Levins, CMA   Authorized by:   Nelwyn Salisbury MD   Signed by:   Alfred Levins, CMA on 05/06/2008   Method used:   [facility]        [facility]  [address] [phone]* (retail)       [address]       [place], [place]  [address]       Ph: [phone] or [phone]       Fax: [phone]   RxID:   [id number]

## 2010-08-30 NOTE — Progress Notes (Signed)
Summary: No prep meds at Rx   Phone Note Call from Patient Call back at Home Phone [phone]   Call For: Dr Jarold Motto Summary of Call[facility] [address] [facility] not [facility] prep meds only a creme. Initial call taken by: Leanor Kail [facility],  Dec 04, 2008 4:02 PM  Follow-up for Phone Call        patient aware Follow-up by: Harlow Mares CMA,  Dec 04, 2008 4:42 PM      Prescriptions: MOVIPREP 100 GM  SOLR (PEG-KCL-NACL-NASULF-NA ASC-C) As per prep instructions.  #1 x 0   Entered by:   Harlow Mares CMA   Authorized by:   Mardella Layman MD FACG,FAGA   Signed by:   Harlow Mares CMA on 12/04/2008   Method used:   [facility]        [facility]  [address] [phone]* (retail)       [address]       [place], [place]  [address]       Ph: [phone] or [phone]       Fax: [phone]   RxID:   [id number]

## 2010-08-30 NOTE — Miscellaneous (Signed)
Summary: Orders Update  Clinical Lists Changes  Orders: Added new Test order of Barium Enema (BE) - Signed Added new Test order of [facility] ([facility]) - Signed    patient given information in [facility]

## 2010-08-30 NOTE — Assessment & Plan Note (Signed)
Summary: chest soreness for over a week/ccm   Vital Signs:  Patient Profile:   77 Years Old Female Height:     62 inches Weight:      151 pounds Temp:     98 degrees F oral Pulse rate:   108 / minute Pulse rhythm:   regular BP sitting:   130 / 86  (left arm) Cuff size:   regular  Vitals Entered By: Raechel Ache, RN (March 12, 2008 11:48 AM)                 Chief Complaint:  Doesn't feel well; no appetite. C/o RUQ pain goes around to back- worse with activity. Hemorrhoids bleeding and thinks she may be bleeding from kidneys or vagina- saw blood in pants..  History of Present Illness: One week of weakness, some urgency to urinate, some back pain, and some loss of apetite. No fevers or vomiting. She also is having bleeding from her hemorrhoids again. saw Dr. Derrell Lolling for this a year ago and had injections.     Current Allergies (reviewed today): ! MACROBID ! LISINOPRIL  Past Medical History:    Reviewed history from 05/30/2007 and no changes required:       High Cholesterol       Shingles       GERD       Hypertension       diverticulitis       esophageal strictures       hemmorrhoids       vertigo     Review of Systems      See HPI   Physical Exam  General:     Well-developed,well-nourished,in no acute distress; alert,appropriate and cooperative throughout examination Abdomen:     Bowel sounds positive,abdomen soft and non-tender without masses, organomegaly or hernias noted.    Impression & Recommendations:  Problem # 1:  ACUTE CYSTITIS (ICD-595.0)  Her updated medication list for this problem includes:    Ciprofloxacin Hcl 500 Mg Tabs (Ciprofloxacin hcl) .Marland Kitchen..Marland Kitchen Two times a day   Complete Medication List: 1)  Lipitor 20 Mg Tabs (Atorvastatin calcium) .... 1/2 once daily 2)  Adult Aspirin Low Strength 81 Mg Tbdp (Aspirin) .Marland Kitchen.. 1 by mouth once daily 3)  Hydrochlorothiazide 25 Mg Tabs (Hydrochlorothiazide) .Marland Kitchen.. 1 by mouth once daily 4)  Protonix 40  Mg Pack (Pantoprazole sodium) .Marland Kitchen.. 1 by mouth once daily 5)  Norvasc 5 Mg Tabs (Amlodipine besylate) .... Once daily 6)  Medrol 2 Mg Tab (Methylprednisolone) 7)  Ciprofloxacin Hcl 500 Mg Tabs (Ciprofloxacin hcl) .... Two times a day  Other Orders: UA Dipstick w/o Micro (manual) (16109) Surgical Referral (Surgery)   Patient Instructions: 1)  Please schedule a follow-up appointment as needed. 2)  Drink as much fluid as you can tolerate for the next few days. Refer to Dr. Derrell Lolling.   Prescriptions: CIPROFLOXACIN HCL 500 MG  TABS (CIPROFLOXACIN HCL) two times a day  #14 x 0   Entered and Authorized by:   Nelwyn Salisbury MD   Signed by:   Nelwyn Salisbury MD on 03/12/2008   Method used:   [facility] to ...       [facility]  [address] [id number]       [address]       [place], [place]  [address]       Ph: [phone] or [phone]       Fax: [phone]  RxID:   [email or web address]  ] Laboratory Results   Urine Tests  Date/Time Recieved: 03/12/08  Routine Urinalysis   Color: yellow Appearance: Clear Glucose: negative   (Normal Range: Negative) Bilirubin: negative   (Normal Range: Negative) Ketone: negative   (Normal Range: Negative) Spec. Gravity: 1.010   (Normal Range: 1.003-1.035) Blood: negative   (Normal Range: Negative) pH: 5.0   (Normal Range: 5.0-8.0) Protein: negative   (Normal Range: Negative) Urobilinogen: negative   (Normal Range: 0-1) Nitrite: negative   (Normal Range: Negative) Leukocyte Esterace: moderate   (Normal Range: Negative)

## 2010-08-30 NOTE — Miscellaneous (Signed)
Summary: Orders Update  Clinical Lists Changes  Orders: Added new Referral order of Neurology Referral (Neuro) - Signed 

## 2010-08-30 NOTE — Consult Note (Signed)
Summary: operative report  operative report   Imported By: Kassie Mends 02/06/2008 10:18:49  _____________________________________________________________________  External Attachment:    Type:   Image     Comment:   operative report

## 2010-08-30 NOTE — Miscellaneous (Signed)
Summary: avapro denied  Medications Added LOSARTAN POTASSIUM 100 MG TABS (LOSARTAN POTASSIUM) 1 once daily       Clinical Lists Changes  Medications: Removed medication of AVAPRO 300 MG TABS (IRBESARTAN) once daily Added new medication of LOSARTAN POTASSIUM 100 MG TABS (LOSARTAN POTASSIUM) 1 once daily - Signed Rx of LOSARTAN POTASSIUM 100 MG TABS (LOSARTAN POTASSIUM) 1 once daily;  #90 x 3;  Signed;  Entered by: Raechel Ache, RN;  Authorized by: Nelwyn Salisbury MD;  Method used: [id number] [address], [place], [place], [place]  [address], Ph: [phone], Fax: [phone]    Prescriptions: LOSARTAN POTASSIUM 100 MG TABS (LOSARTAN POTASSIUM) 1 once daily  #90 x 3   Entered by:   Raechel Ache, RN   Authorized by:   Nelwyn Salisbury MD   Signed by:   Raechel Ache, RN on 09/28/2009   Method used:   [facility]ronically to        [facility]  [address] [phone]* (retail)       [address]       [place], [place]  [address]       Ph: [phone]       Fax: [phone]   RxID:   [id number]

## 2010-08-30 NOTE — Procedures (Signed)
Summary: EGD   EGD  Procedure date:  09/30/2004  Findings:      Location: [facility]    EGD  Procedure date:  09/30/2004  Findings:      Location: [facility]    Patient Name: Pedraza, Genita Nilsson. MRN:  Procedure Procedures: Panendoscopy (EGD) CPT: 43235.    with esophageal dilation. CPT: G9296129.  Personnel: Endoscopist: Vania Rea. Jarold Motto, MD.  Exam Location: Exam performed in Outpatient Clinic. Outpatient  Patient Consent: Procedure, Alternatives, Risks and Benefits discussed, consent obtained, from patient. Consent was obtained by the RN.  Indications Symptoms: Dysphagia.  History  Current Medications: Patient is not currently taking Coumadin.  Pre-Exam Physical: Performed Sep 30, 2004  Cardio-pulmonary exam, Abdominal exam, Extremity exam, Mental status exam WNL.  Exam Exam Info: Maximum depth of insertion Duodenum, intended Duodenum. Patient position: on left side. Duration of exam: 15 minutes. Vocal cords visualized. Gastric retroflexion performed. Images taken. ASA Classification: II. Tolerance: excellent.  Sedation Meds: Patient assessed and found to be appropriate for moderate (conscious) sedation. Fentanyl 25 mcg. given IV. Versed 3 mg. given IV. Cetacaine Spray 2 sprays given aerosolized.  Monitoring: BP and pulse monitoring done. Oximetry used. Supplemental O2 given at 2 Liters.  Instrument(s): GIF 160. Serial [id number].   Findings - Normal: Proximal Esophagus to Distal Esophagus. Not Seen: Tumor. Barrett's esophagus. Esophageal inflammation. Mucosal abnormality. Varices.  - STRICTURE / STENOSIS: Distal Esophagus.  Constriction: partial. Lumen diameter is 14 mm. ICD9: Esophageal Stricture: 530.3.  - Dilation: Distal Esophagus. Maloney dilator used, Diameter: 54 F, Minimal Resistance, No Heme present on extraction. 1  total dilators used. Patient tolerance excellent. Outcome: successful.  - Normal: Cardia to Duodenal  2nd Portion. Tumor. Ulcer. Mucosal abnormality. AVM's. Foreign body.   Assessment  Diagnoses: 530.3: Esophageal Stricture. GERD.   Events  Unplanned Intervention: No unplanned interventions were required.  Plans Medication(s): Continue current medications.  Patient Education: Patient given standard instructions for: Reflux. Stenosis / Stricture.  Disposition: After procedure patient sent to recovery. After recovery patient sent home.  Scheduling: Follow-up prn.    CC: Gershon Crane, MD  This report was created from the original endoscopy report, which was reviewed and signed by the above listed endoscopist.

## 2010-08-30 NOTE — Assessment & Plan Note (Signed)
Summary: PROBLEM WITH HEMROIDS/JLS   Vital Signs:  Patient profile:   77 year old female Weight:      153 pounds Temp:     98.7 degrees F oral Pulse rate:   75 / minute BP sitting:   128 / 84  (left arm) Cuff size:   regular  Vitals Entered By: Alfred Levins, CMA (November 04, 2008 2:37 PM) CC: hemorrhoids, hematuria   History of Present Illness: here for occasional blood in the urine, which may actually be from hemorrhoids. She sees Dr. Derrell Lolling for injections periodically, and had a set in 9-09. No urinary burning or urgency. No abdominal pain. Feels tired aa over and gets diffuse mild muscles aches at times. Aleeve helps.   Allergies: 1)  ! Macrobid 2)  ! Lisinopril 3)  ! Amoxicillin  Past History:  Past Medical History:    High Cholesterol    Shingles    GERD    Hypertension    diverticulitis    esophageal strictures    hemmorrhoids, per Dr. Derrell Lolling. Gets injections about once a year.    vertigo  Past Surgical History:    Appendectomy    Hysterectomy    Tonsillectomy    colonoscopy 8-03 per Dr. Jarold Motto    EGD with dilatation 3-06 per Dr. Jarold Motto    benign lesion removed from left face per Dr. Lonni Fix    Cataract extraction per Dr. Dione Booze  Review of Systems  The patient denies anorexia, fever, weight loss, weight gain, vision loss, decreased hearing, hoarseness, chest pain, syncope, dyspnea on exertion, peripheral edema, prolonged cough, headaches, hemoptysis, abdominal pain, melena, hematochezia, severe indigestion/heartburn, incontinence, genital sores, muscle weakness, suspicious skin lesions, transient blindness, difficulty walking, depression, unusual weight change, abnormal bleeding, enlarged lymph nodes, angioedema, breast masses, and testicular masses.    Physical Exam  General:  Well-developed,well-nourished,in no acute distress; alert,appropriate and cooperative throughout examination Lungs:  Normal respiratory effort, chest expands symmetrically. Lungs are  clear to auscultation, no crackles or wheezes. Heart:  Normal rate and regular rhythm. S1 and S2 normal without gallop, murmur, click, rub or other extra sounds. Abdomen:  Bowel sounds positive,abdomen soft and non-tender without masses, organomegaly or hernias noted. Msk:  muscles are not  tender   Impression & Recommendations:  Problem # 1:  GROSS HEMATURIA (ICD-599.71)  Orders: UA Dipstick w/o Micro (manual) (44010)  Problem # 2:  MYALGIA (ICD-729.1)  Her updated medication list for this problem includes:    Adult Aspirin Low Strength 81 Mg Tbdp (Aspirin) .Marland Kitchen... 1 by mouth once daily  Problem # 3:  HEMORRHOIDS, EXTERNAL THROMBOSED (ICD-455.4)  Orders: Gastroenterology Referral (GI)  Complete Medication List: 1)  Adult Aspirin Low Strength 81 Mg Tbdp (Aspirin) .Marland Kitchen.. 1 by mouth once daily 2)  Hydrochlorothiazide 25 Mg Tabs (Hydrochlorothiazide) .Marland Kitchen.. 1 by mouth once daily 3)  Protonix 40 Mg Pack (Pantoprazole sodium) .Marland Kitchen.. 1 by mouth once daily 4)  Atenolol 100 Mg Tabs (Atenolol) .Marland Kitchen.. 1 by mouth once daily 5)  Avapro 150 Mg Tabs (Irbesartan) .... Once daily  Patient Instructions: 1)  See Dr. Jarold Motto again to assess whether another colonoscopy is warranted. The myalgias could be due to Lipitor. She will stop this for now, and recheck in one month. See Dr. Derrell Lolling again about the hemorrhoids.  Laboratory Results   Urine Tests  Date/Time Received: November 04, 2008 3:04 PM Date/Time Reported: November 04, 2008 3:04 PM  Routine Urinalysis   Color: yellow Appearance: Clear Glucose: negative   (  Normal Range: Negative) Bilirubin: negative   (Normal Range: Negative) Ketone: negative   (Normal Range: Negative) Spec. Gravity: 1.010   (Normal Range: 1.003-1.035) Blood: negative   (Normal Range: Negative) pH: 5.0   (Normal Range: 5.0-8.0) Protein: negative   (Normal Range: Negative) Urobilinogen: 0.2   (Normal Range: 0-1) Nitrite: negative   (Normal Range: Negative) Leukocyte  Esterace: negative   (Normal Range: Negative)    Comments: Alfred Levins, CMA  November 04, 2008 3:04 PM

## 2010-08-30 NOTE — Progress Notes (Signed)
Summary: diarrhea  Phone Note Call from Patient Call back at Home Phone [phone]   Caller: Patient Call For: Nelwyn Salisbury MD Summary of Call: C/o recent diarrhea with urgency in the mornings- no upset stomach and doesn't feel sick. What can she take? and wonders if her meds are causing this?  [facility] Initial call taken by: Raechel Ache, RN,  September 29, 2009 10:49 AM  Follow-up for Phone Call        I am not sure where this is coming from. Doubt it is her meds unless the Cipro we gave her last month started it. Try Imodium AD as needed . Follow-up by: Nelwyn Salisbury MD,  September 29, 2009 12:07 PM  Additional Follow-up for Phone Call Additional follow up Details #1::        Phone Call Completed Additional Follow-up by: Raechel Ache, RN,  September 29, 2009 12:13 PM

## 2010-08-30 NOTE — Assessment & Plan Note (Signed)
Summary: EMP/FASTING/CJR   Vital Signs:  Patient profile:   77 year old female Height:      62 inches Weight:      141 pounds BMI:     25.88 O2 Sat:      97 % Temp:     97.8 degrees F Pulse rate:   63 / minute BP sitting:   120 / 80  (left arm) Cuff size:   regular  Vitals Entered By: Pura Spice, RN (June 30, 2010 10:00 AM)  History of Present Illness: 78 yr old female for a cpx. She feels well and has no complaints.   Allergies: 1)  ! Macrobid 2)  ! Lisinopril 3)  ! Amoxicillin  Past History:  Past Medical History: High Cholesterol Shingles GERD Hypertension diverticulitis esophageal strictures internal and external hemmorrhoids, per Dr. Derrell Lolling. Gets injections about once a year. vertigo right posterior cerebral artery stroke 2010, sees Dr. Pearlean Brownie B12 deficiency hyperglycemia  Past Surgical History: Reviewed history from 06/28/2009 and no changes required. Appendectomy Hysterectomy Tonsillectomy colonoscopy 12-09-08  per Dr. Jarold Motto (incomplete) along with barium enema 12-10-08 (hemorrhoids and tics) EGD with dilatation 3-06 per Dr. Jarold Motto benign lesion removed from left face per Dr. Lonni Fix Cataract extraction per Dr. Dione Booze  Past History:  Care Management: Dermatology: Dr Lonni Fix Gastroenterology: Dr Jarold Motto Ophthalmology: Dr Dione Booze Surgery: Dr Derrell Lolling   Family History: Reviewed history from 03/19/2007 and no changes required. Family History Breast cancer 1st degree relative <50 Family History Diabetes 1st degree relative Family History Hypertension Family History of Stroke M 1st degree relative <50 Family History of Cardiovascular disorder  Social History: Reviewed history from 03/19/2007 and no changes required. Retired Married Never Smoked Alcohol use-no Drug use-no Regular exercise-yes  Review of Systems  The patient denies anorexia, fever, weight loss, weight gain, vision loss, decreased hearing, hoarseness, chest pain, syncope,  dyspnea on exertion, peripheral edema, prolonged cough, headaches, hemoptysis, abdominal pain, melena, hematochezia, severe indigestion/heartburn, hematuria, incontinence, genital sores, muscle weakness, suspicious skin lesions, transient blindness, difficulty walking, depression, unusual weight change, abnormal bleeding, enlarged lymph nodes, angioedema, breast masses, and testicular masses.    Physical Exam  General:  Well-developed,well-nourished,in no acute distress; alert,appropriate and cooperative throughout examination Head:  Normocephalic and atraumatic without obvious abnormalities. No apparent alopecia or balding. Eyes:  No corneal or conjunctival inflammation noted. EOMI. Perrla. Funduscopic exam benign, without hemorrhages, exudates or papilledema. Vision grossly normal. Ears:  External ear exam shows no significant lesions or deformities.  Otoscopic examination reveals clear canals, tympanic membranes are intact bilaterally without bulging, retraction, inflammation or discharge. Hearing is grossly normal bilaterally. Nose:  External nasal examination shows no deformity or inflammation. Nasal mucosa are pink and moist without lesions or exudates. Mouth:  Oral mucosa and oropharynx without lesions or exudates.  Teeth in good repair. Neck:  No deformities, masses, or tenderness noted. Chest Wall:  No deformities, masses, or tenderness noted. Breasts:  No mass, nodules, thickening, tenderness, bulging, retraction, inflamation, nipple discharge or skin changes noted.   Lungs:  Normal respiratory effort, chest expands symmetrically. Lungs are clear to auscultation, no crackles or wheezes. Heart:  Normal rate and regular rhythm. S1 and S2 normal without gallop, murmur, click, rub or other extra sounds. EKG normal Abdomen:  Bowel sounds positive,abdomen soft and non-tender without masses, organomegaly or hernias noted. Msk:  No deformity or scoliosis noted of thoracic or lumbar spine.     Pulses:  R and L carotid,radial,femoral,dorsalis pedis and posterior tibial pulses are full  and equal bilaterally Extremities:  No clubbing, cyanosis, edema, or deformity noted with normal full range of motion of all joints.   Neurologic:  No cranial nerve deficits noted. Station and gait are normal. Plantar reflexes are down-going bilaterally. DTRs are symmetrical throughout. Sensory, motor and coordinative functions appear intact. Skin:  Intact without suspicious lesions or rashes Cervical Nodes:  No lymphadenopathy noted Axillary Nodes:  No palpable lymphadenopathy Inguinal Nodes:  No significant adenopathy Psych:  Cognition and judgment appear intact. Alert and cooperative with normal attention span and concentration. No apparent delusions, illusions, hallucinations   Impression & Recommendations:  Problem # 1:  RENAL INSUFFICIENCY (ICD-588.9)  Problem # 2:  POLYMYALGIA RHEUMATICA (ICD-725)  Problem # 3:  CEREBROVASCULAR ACCIDENT (ICD-434.91)  Her updated medication list for this problem includes:    Plavix 75 Mg Tabs (Clopidogrel bisulfate) .Marland Kitchen... Take 1 tab by mouth daily  Problem # 4:  VITAMIN B12 DEFICIENCY (ICD-266.2)  Orders: TLB-B12, Serum-Total ONLY (98119-J47)  Problem # 5:  ANXIETY (ICD-300.00)  Her updated medication list for this problem includes:    Lorazepam 0.5 Mg Tabs (Lorazepam) .Marland Kitchen... 1 q 6 hours as needed anxiety  Problem # 6:  HYPERLIPIDEMIA (ICD-272.4)  Her updated medication list for this problem includes:    Simvastatin 20 Mg Tabs (Simvastatin) .Marland Kitchen... 1 by mouth at bedtime  Problem # 7:  HYPERTENSION (ICD-401.9)  Her updated medication list for this problem includes:    Hydrochlorothiazide 25 Mg Tabs (Hydrochlorothiazide) .Marland Kitchen... 1 by mouth once daily    Atenolol 100 Mg Tabs (Atenolol) .Marland Kitchen... 1 by mouth once daily    Losartan Potassium 100 Mg Tabs (Losartan potassium) .Marland Kitchen... 1 once daily  Orders: UA Dipstick w/o Micro (automated)   (81003) Venipuncture (82956) TLB-Lipid Panel (80061-LIPID) TLB-BMP (Basic Metabolic Panel-BMET) (80048-METABOL) TLB-CBC Platelet - w/Differential (85025-CBCD) TLB-Hepatic/Liver Function Pnl (80076-HEPATIC) TLB-TSH (Thyroid Stimulating Hormone) (84443-TSH) EKG w/ Interpretation (93000)  Problem # 8:  HYPERGLYCEMIA (ICD-790.29)  Orders: TLB-A1C / Hgb A1C (Glycohemoglobin) (83036-A1C)  Complete Medication List: 1)  Plavix 75 Mg Tabs (Clopidogrel bisulfate) .... Take 1 tab by mouth daily 2)  Hydrochlorothiazide 25 Mg Tabs (Hydrochlorothiazide) .Marland Kitchen.. 1 by mouth once daily 3)  Protonix 40 Mg Pack (Pantoprazole sodium) .Marland Kitchen.. 1 by mouth once daily 4)  Atenolol 100 Mg Tabs (Atenolol) .Marland Kitchen.. 1 by mouth once daily 5)  Lorazepam 0.5 Mg Tabs (Lorazepam) .Marland Kitchen.. 1 q 6 hours as needed anxiety 6)  Cyanocobalamin 1000 Mcg/ml Soln (Cyanocobalamin) .... Inject weekly 7)  Syringe 2-3 Ml 3 Ml Misc (Syringe (disposable)) .... Use as directed 8)  Bd Disp Needle 25g X 1" Misc (Needle (disp)) .... Use as directed 9)  Simvastatin 20 Mg Tabs (Simvastatin) .Marland Kitchen.. 1 by mouth at bedtime 10)  Losartan Potassium 100 Mg Tabs (Losartan potassium) .Marland Kitchen.. 1 once daily  Patient Instructions: 1)  get fasting labs today Prescriptions: ATENOLOL 100 MG TABS (ATENOLOL) 1 by mouth once daily  #30 x 11   Entered and Authorized by:   Nelwyn Salisbury MD   Signed by:   Nelwyn Salisbury MD on 06/30/2010   Method used:   [facility] to        [facility]  [address] [phone]* (retail)       [address]       [place], [place]  [address]       Ph: [phone]       Fax: [phone]   RxID:   [id number]  Orders Added: 1)  Est. Patient Level IV [55732] 2)  UA Dipstick w/o Micro (automated)  [81003] 3)  Venipuncture [36415] 4)  TLB-Lipid Panel [80061-LIPID] 5)  TLB-BMP (Basic Metabolic Panel-BMET) [80048-METABOL] 6)  TLB-CBC Platelet - w/Differential [85025-CBCD] 7)  TLB-Hepatic/Liver Function Pnl  [80076-HEPATIC] 8)  TLB-TSH (Thyroid Stimulating Hormone) [84443-TSH] 9)  TLB-B12, Serum-Total ONLY [82607-B12] 10)  TLB-A1C / Hgb A1C (Glycohemoglobin) [83036-A1C] 11)  EKG w/ Interpretation [93000]   Immunization History:  Influenza Immunization History:    Influenza:  historical (04/07/2010)   Immunization History:  Influenza Immunization History:    Influenza:  Historical (04/07/2010)

## 2010-08-30 NOTE — Progress Notes (Signed)
Summary: test results  Phone Note Outgoing Call   Call placed by: Raechel Ache, RN,  November 10, 2009 2:40 PM Call placed to: Patient Summary of Call: report given on cardiac monitor test- normal. Initial call taken by: Raechel Ache, RN,  November 10, 2009 4:29 PM

## 2010-08-30 NOTE — Procedures (Signed)
Summary: Colonoscopy   Colonoscopy  Procedure date:  12/09/2008  Findings:      Location:  [facility].    COLONOSCOPY PROCEDURE REPORT  PATIENT:  Christine, Middleton  MR#:  [id number] BIRTHDATE:   05-May-1923, 86 yrs. old   GENDER:   female  ENDOSCOPIST:   Vania Rea. Jarold Motto, MD, [facility] Referred by:   PROCEDURE DATE:  12/09/2008 PROCEDURE:  Colonoscopy, diagnostic ASA CLASS:   Class II INDICATIONS: rectal bleeding, hematochezia   MEDICATIONS:    Fentanyl 50 mcg IV, Versed 8 IV  DESCRIPTION OF PROCEDURE:   After the risks benefits and alternatives of the procedure were thoroughly explained, informed consent was obtained.  Digital rectal exam was performed and revealed tender and decreased sphincter tone.   The LB [id number]-[id number] endoscope was introduced through the anus and advanced to the sigmoid colon, limited by a tortuous and redundant colon.  with the pediatric scope i could not pass the mid-sigmoid per severe DIVERTICULOSIS AND TORTUOSITY.  The quality of the prep was excellent, using MoviPrep.  The instrument was then slowly withdrawn as the colon was fully examined. <<PROCEDUREIMAGES>><<OLD IMAGES>>  FINDINGS:  Internal and external hemorrhoids were found. LARGE NONBLEEDING MIXED HEMORRHOIDS.   Retroflexed views in the rectum revealed internal and external hemorrhoids.    The scope was then withdrawn from the patient and the procedure completed.  COMPLICATIONS:   None  ENDOSCOPIC IMPRESSION:  1) Internal and external hemorrhoids  INCOPLETE EXAM PER SEVERE DIVERTICULOSIS. RECOMMENDATIONS:  1.BARIUM ENEMA TO BE COMPLETE  2.SURGICAL REFERRAL FOR HEMORRHOIDECTOMY.SHE SEES DR. INGRAM.  REPEAT EXAM:   No   _______________________________ Vania Rea. Jarold Motto, MD, [facility]  CC: Nelwyn Salisbury, MD Claud Kelp, MD

## 2010-08-30 NOTE — Letter (Signed)
Summary: Bleeding Hemorroids/[facility]  Bleeding Hemorroids/[facility]   Imported By: Charlette Caffey 01/13/2009 14:30:07  _____________________________________________________________________  External Attachment:    Type:   Image     Comment:   External Document

## 2010-08-30 NOTE — Progress Notes (Signed)
Summary: PA form  Phone Note From Other Clinic   Caller: [employer] [facility] [phone] Call For: debbie Summary of Call: Faxed form for Avapro PA 4-28 per Dr. Claris Che request & have not received it back.  Please fax form back to [phone].  Will try again next 24 hrs or so.  If clinical info not rececived, will have to deny because of no clinical info being received.   Dr. Clent Ridges:  Did you receive? Initial call taken by: Rudy Jew, RN,  Dec 01, 2009 1:44 PM  Follow-up for Phone Call        No I do not have anything on her  Follow-up by: Nelwyn Salisbury MD,  Dec 02, 2009 10:01 AM  Additional Follow-up for Phone Call Additional follow up Details #1::        Unable to get through to a "person" at this number. Will wait for the fax. Additional Follow-up by: Lynann Beaver CMA,  Dec 02, 2009 10:07 AM

## 2010-08-30 NOTE — Assessment & Plan Note (Signed)
Summary: BLOOD PRESSURE/MHF   Vital Signs:  Patient Profile:   77 Years Old Female Height:     62 inches Weight:      151 pounds Temp:     98.3 degrees F oral Pulse rate:   75 / minute BP sitting:   170 / 94  (left arm) Cuff size:   regular  Vitals Entered By: Alfred Levins, CMA (February 25, 2008 10:40 AM)                 Chief Complaint:  bp check.  History of Present Illness: Here because her BP has been creeping up over the past few months. At the eye doctors office her systolic has been in 170's or 295'M. She feels fine.    Current Allergies (reviewed today): ! MACROBID ! LISINOPRIL  Past Medical History:    Reviewed history from 05/30/2007 and no changes required:       High Cholesterol       Shingles       GERD       Hypertension       diverticulitis       esophageal strictures       hemmorrhoids       vertigo     Review of Systems  The patient denies anorexia, fever, weight loss, weight gain, vision loss, decreased hearing, hoarseness, chest pain, syncope, dyspnea on exertion, peripheral edema, prolonged cough, headaches, hemoptysis, abdominal pain, melena, hematochezia, severe indigestion/heartburn, hematuria, incontinence, genital sores, muscle weakness, suspicious skin lesions, transient blindness, difficulty walking, depression, unusual weight change, abnormal bleeding, enlarged lymph nodes, angioedema, breast masses, and testicular masses.     Physical Exam  General:     Well-developed,well-nourished,in no acute distress; alert,appropriate and cooperative throughout examination Neck:     No deformities, masses, or tenderness noted. Lungs:     Normal respiratory effort, chest expands symmetrically. Lungs are clear to auscultation, no crackles or wheezes. Heart:     Normal rate and regular rhythm. S1 and S2 normal without gallop, murmur, click, rub or other extra sounds.    Impression & Recommendations:  Problem # 1:  HYPERTENSION  (ICD-401.9)  Her updated medication list for this problem includes:    Hydrochlorothiazide 25 Mg Tabs (Hydrochlorothiazide) .Marland Kitchen... 1 by mouth once daily    Atenolol 25 Mg Tabs (Atenolol) .Marland Kitchen... 1 by mouth once daily    Norvasc 5 Mg Tabs (Amlodipine besylate) ..... Once daily   Complete Medication List: 1)  Lipitor 20 Mg Tabs (Atorvastatin calcium) .... 1/2 once daily 2)  Adult Aspirin Low Strength 81 Mg Tbdp (Aspirin) .Marland Kitchen.. 1 by mouth once daily 3)  Hydrochlorothiazide 25 Mg Tabs (Hydrochlorothiazide) .Marland Kitchen.. 1 by mouth once daily 4)  Protonix 40 Mg Pack (Pantoprazole sodium) .Marland Kitchen.. 1 by mouth once daily 5)  Atenolol 25 Mg Tabs (Atenolol) .Marland Kitchen.. 1 by mouth once daily 6)  Norvasc 5 Mg Tabs (Amlodipine besylate) .... Once daily   Patient Instructions: 1)  Add Norvasc. Will recheck in 2 months at her cpx.   Prescriptions: NORVASC 5 MG  TABS (AMLODIPINE BESYLATE) once daily  #30 x 11   Entered and Authorized by:   Nelwyn Salisbury MD   Signed by:   Nelwyn Salisbury MD on 02/25/2008   Method used:   [facility] to ...       [id number]       [address]       [place]  [place], [place]  [address]       Ph: [phone] or [phone]       Fax: [phone]   RxID:   [id number]  ]

## 2010-08-30 NOTE — Assessment & Plan Note (Signed)
Summary: fu on bp meds/njr   Vital Signs:  Patient Profile:   77 Years Old Female Height:     62 inches Weight:      150 pounds Temp:     98.0 degrees F oral Pulse rate:   72 / minute Pulse rhythm:   regular BP sitting:   122 / 74  (left arm) Cuff size:   regular  Vitals Entered By: Alfred Levins, CMA (April 08, 2008 1:29 PM)                 Chief Complaint:  discuss meds.  History of Present Illness: Here to recheck BP. We switched from Norvasc to Clonidine a month ago. Her BP is stable, and the swelling in her feet went away. However the new pill makes her mouth extremely dry.     Current Allergies (reviewed today): ! MACROBID ! LISINOPRIL  Past Medical History:    Reviewed history from 05/30/2007 and no changes required:       High Cholesterol       Shingles       GERD       Hypertension       diverticulitis       esophageal strictures       hemmorrhoids       vertigo     Review of Systems  The patient denies anorexia, fever, weight loss, weight gain, vision loss, decreased hearing, hoarseness, chest pain, syncope, dyspnea on exertion, peripheral edema, prolonged cough, headaches, hemoptysis, abdominal pain, melena, hematochezia, severe indigestion/heartburn, hematuria, incontinence, genital sores, muscle weakness, suspicious skin lesions, transient blindness, difficulty walking, depression, unusual weight change, abnormal bleeding, enlarged lymph nodes, angioedema, breast masses, and testicular masses.     Physical Exam  General:     Well-developed,well-nourished,in no acute distress; alert,appropriate and cooperative throughout examination    Impression & Recommendations:  Problem # 1:  HYPERTENSION (ICD-401.9)  The following medications were removed from the medication list:    Norvasc 5 Mg Tabs (Amlodipine besylate) ..... Once daily    Clonidine Hcl 0.1 Mg Tabs (Clonidine hcl) .Marland Kitchen... 1 by mouth two times a day  Her updated medication list for  this problem includes:    Hydrochlorothiazide 25 Mg Tabs (Hydrochlorothiazide) .Marland Kitchen... 1 by mouth once daily    Atenolol 100 Mg Tabs (Atenolol) .Marland Kitchen... 1 by mouth once daily    Avapro 150 Mg Tabs (Irbesartan) ..... Once daily   Complete Medication List: 1)  Lipitor 20 Mg Tabs (Atorvastatin calcium) .... 1/2 once daily 2)  Adult Aspirin Low Strength 81 Mg Tbdp (Aspirin) .Marland Kitchen.. 1 by mouth once daily 3)  Hydrochlorothiazide 25 Mg Tabs (Hydrochlorothiazide) .Marland Kitchen.. 1 by mouth once daily 4)  Protonix 40 Mg Pack (Pantoprazole sodium) .Marland Kitchen.. 1 by mouth once daily 5)  Atenolol 100 Mg Tabs (Atenolol) .Marland Kitchen.. 1 by mouth once daily 6)  Avapro 150 Mg Tabs (Irbesartan) .... Once daily   Patient Instructions: 1)  Stop Clonidine and switch to Avapro.  2)  Please schedule a follow-up appointment in 1 month.   Prescriptions: AVAPRO 150 MG TABS (IRBESARTAN) once daily  #28 x 0   Entered and Authorized by:   Nelwyn Salisbury MD   Signed by:   Nelwyn Salisbury MD on 04/08/2008   Method used:   Samples Given   RxID:   [id number]  ]

## 2010-08-30 NOTE — Assessment & Plan Note (Signed)
Summary: EMP/V70.0/CCM   Vital Signs:  Patient Profile:   77 Years Old Female Height:     62 inches Weight:      151 pounds Temp:     97.6 degrees F oral Pulse rate:   68 / minute BP sitting:   140 / 78  (left arm) Cuff size:   regular  Vitals Entered By: Alfred Levins, CMA (May 30, 2007 9:02 AM)                 Chief Complaint:  cpx and fasting.  History of Present Illness: 77 yr old female for cpx. Feels fine, no GERD sx or trouble swallowing. Walks for exercise. No complaints except wants me to check a red spot on her left leg that has been present for the past year. It sometimes bleeds, and it is growing larger over time.  Current Allergies: ! MACROBID ! LISINOPRIL  Past Medical History:    Reviewed history from 03/19/2007 and no changes required:       High Cholesterol       Shingles       GERD       Hypertension       diverticulitis       esophageal strictures       hemmorrhoids       vertigo  Past Surgical History:    Reviewed history from 03/19/2007 and no changes required:       Appendectomy       Hysterectomy       Tonsillectomy       colonoscopy 8-03 per Dr. Jarold Motto       EGD with dilatation 3-06 per Dr. Jarold Motto       benign lesion removed from left face per Dr. Lonni Fix   Family History:    Reviewed history from 03/19/2007 and no changes required:       Family History Breast cancer 1st degree relative <50       Family History Diabetes 1st degree relative       Family History Hypertension       Family History of Stroke M 1st degree relative <50       Family History of Cardiovascular disorder  Social History:    Reviewed history from 03/19/2007 and no changes required:       Retired       Married       Never Smoked       Alcohol use-no       Drug use-no       Regular exercise-yes    Review of Systems  The patient denies anorexia, fever, weight loss, vision loss, decreased hearing, hoarseness, chest pain, syncope, dyspnea on  exhertion, peripheral edema, prolonged cough, hemoptysis, abdominal pain, melena, hematochezia, severe indigestion/heartburn, hematuria, incontinence, genital sores, muscle weakness, suspicious skin lesions, transient blindness, difficulty walking, depression, unusual weight change, abnormal bleeding, enlarged lymph nodes, angioedema, breast masses, and testicular masses.     Physical Exam  General:     Well-developed,well-nourished,in no acute distress; alert,appropriate and cooperative throughout examination Head:     Normocephalic and atraumatic without obvious abnormalities. No apparent alopecia or balding. Eyes:     No corneal or conjunctival inflammation noted. EOMI. Perrla. Funduscopic exam benign, without hemorrhages, exudates or papilledema. Vision grossly normal. Ears:     External ear exam shows no significant lesions or deformities.  Otoscopic examination reveals clear canals, tympanic membranes are intact bilaterally without bulging, retraction, inflammation or discharge. Hearing is grossly  normal bilaterally. Nose:     External nasal examination shows no deformity or inflammation. Nasal mucosa are pink and moist without lesions or exudates. Mouth:     Oral mucosa and oropharynx without lesions or exudates.  Teeth in good repair. Neck:     No deformities, masses, or tenderness noted. Chest Wall:     No deformities, masses, or tenderness noted. Breasts:     No mass, nodules, thickening, tenderness, bulging, retraction, inflamation, nipple discharge or skin changes noted.   Lungs:     Normal respiratory effort, chest expands symmetrically. Lungs are clear to auscultation, no crackles or wheezes. Heart:     Normal rate and regular rhythm. S1 and S2 normal without gallop, murmur, click, rub or other extra sounds. EKG normal. Abdomen:     Bowel sounds positive,abdomen soft and non-tender without masses, organomegaly or hernias noted. Rectal:     No external abnormalities noted.  Normal sphincter tone. No rectal masses or tenderness. Heme neg. Msk:     No deformity or scoliosis noted of thoracic or lumbar spine.   Pulses:     R and L carotid,radial,femoral,dorsalis pedis and posterior tibial pulses are full and equal bilaterally Extremities:     No clubbing, cyanosis, edema, or deformity noted with normal full range of motion of all joints.   Neurologic:     No cranial nerve deficits noted. Station and gait are normal. Plantar reflexes are down-going bilaterally. DTRs are symmetrical throughout. Sensory, motor and coordinative functions appear intact. Skin:     clear except for a red papulomacular lesion on left lower leg, about 1.0 cm across. One side of it is raised with a pearly border, consistent with either a basal cell or squamous cell carcinoma. Cervical Nodes:     No lymphadenopathy noted Axillary Nodes:     No palpable lymphadenopathy Inguinal Nodes:     No significant adenopathy Psych:     Cognition and judgment appear intact. Alert and cooperative with normal attention span and concentration. No apparent delusions, illusions, hallucinations    Impression & Recommendations:  Problem # 1:  HYPERLIPIDEMIA (ICD-272.4)  lot [id number], EXP 30 jun 09, sanofi pasteur left deltoid IM, 0.5 cc. Her updated medication list for this problem includes:    Lipitor 20 Mg Tabs (Atorvastatin calcium) .Marland Kitchen... 1/2 once daily   Problem # 2:  SKIN LESION (ICD-709.9)  Orders: Dermatology Referral (Derma)   Problem # 3:  HYPERTENSION (ICD-401.9)  Her updated medication list for this problem includes:    Hydrochlorothiazide 25 Mg Tabs (Hydrochlorothiazide) .Marland Kitchen... 1 by mouth once daily    Atenolol 25 Mg Tabs (Atenolol) .Marland Kitchen... 1 by mouth once daily  Orders: EKG w/ Interpretation (93000) UA Dipstick w/o Micro (81002) Venipuncture (13244) TLB-Lipid Panel (80061-LIPID) TLB-BMP (Basic Metabolic Panel-BMET) (80048-METABOL) TLB-CBC Platelet - w/Differential  (85025-CBCD) TLB-Hepatic/Liver Function Pnl (80076-HEPATIC) TLB-TSH (Thyroid Stimulating Hormone) (84443-TSH)   Problem # 4:  GERD (ICD-530.81)  Her updated medication list for this problem includes:    Protonix 40 Mg Pack (Pantoprazole sodium) .Marland Kitchen... 1 by mouth once daily   Complete Medication List: 1)  Lipitor 20 Mg Tabs (Atorvastatin calcium) .... 1/2 once daily 2)  Adult Aspirin Low Strength 81 Mg Tbdp (Aspirin) .Marland Kitchen.. 1 by mouth once daily 3)  Hydrochlorothiazide 25 Mg Tabs (Hydrochlorothiazide) .Marland Kitchen.. 1 by mouth once daily 4)  Protonix 40 Mg Pack (Pantoprazole sodium) .Marland Kitchen.. 1 by mouth once daily 5)  Atenolol 25 Mg Tabs (Atenolol) .Marland Kitchen.. 1 by mouth once daily  Other  Orders: Influenza Vaccine MCR (16109) Pneumococcal Vaccine (60454) Admin 1st Vaccine (09811)   Patient Instructions: 1)  Please schedule a follow-up appointment in 1 year. Will refer to Dr. Lonni Fix to check lesion on her leg.    Prescriptions: ATENOLOL 25 MG  TABS (ATENOLOL) 1 by mouth once daily  #30 x 11   Entered and Authorized by:   Nelwyn Salisbury MD   Signed by:   Alfred Levins, CMA on 05/30/2007   Method used:   Electronically sent to ...       [facility] [id number]       [address]       [place], [place]  [address]       Ph: [phone] or [phone]       Fax: [phone]   RxID:   [id number] PROTONIX 40 MG  PACK (PANTOPRAZOLE SODIUM) 1 by mouth once daily  #30 x 11   Entered and Authorized by:   Nelwyn Salisbury MD   Signed by:   Alfred Levins, CMA on 05/30/2007   Method used:   Electronically sent to ...       [facility]  [address] [id number]       [address]       [place], [place]  [address]       Ph: [phone] or [phone]       Fax: [phone]   RxID:   [id number] HYDROCHLOROTHIAZIDE 25 MG  TABS (HYDROCHLOROTHIAZIDE) 1 by mouth once daily  #30 x 11   Entered and Authorized by:   Nelwyn Salisbury MD   Signed by:   Alfred Levins, CMA  on 05/30/2007   Method used:   Electronically sent to ...       [facility] [address] [id number]       [address]       [place], [place]  [address]       Ph: [phone] or [phone]       Fax: [phone]   RxID:   [id number] LIPITOR 20 MG TABS (ATORVASTATIN CALCIUM) 1/2 once daily  #30 x 11   Entered and Authorized by:   Nelwyn Salisbury MD   Signed by:   Alfred Levins, CMA on 05/30/2007   Method used:   Electronically sent to ...       [facility]  [address] [id number]       [address]       [place], [place]  [address]       Ph: [phone] or [phone]       Fax: [phone]   RxID:   [id number]  ]  Pneumovax Vaccine    Vaccine Type: Pneumovax    Site: right deltoid    Mfr: Merck    Dose: 0.5 ml    Route: IM    Given by: Alfred Levins, CMA    Exp. Date: 12/20/2008    Lot #: [id number]  Influenza Vaccine    Vaccine Type: Fluvax MCR    Given by: Alfred Levins, CMA  Flu Vaccine Consent Questions    Do you have a history of severe allergic reactions to this vaccine? no    Any prior history of allergic reactions to egg and/or gelatin? no    Do you  have a sensitivity to the preservative Thimersol? no    Do you have a past history of Guillan-Barre Syndrome? no    Do you currently have an acute febrile illness? no    Have you ever had a severe reaction to latex? no    Vaccine information given and explained to patient? yes    Are you currently pregnant? no   Appended Document: EMP/V70.0/CCM  Laboratory Results   Urine Tests    Routine Urinalysis   Color: yellow Appearance: Clear Glucose: negative   (Normal Range: Negative) Bilirubin: negative   (Normal Range: Negative) Ketone: negative   (Normal Range: Negative) Spec. Gravity: 1.015   (Normal Range: 1.003-1.035) Blood: negative   (Normal Range: Negative) pH: 7.5   (Normal Range: 5.0-8.0) Protein: negative   (Normal Range: Negative) Urobilinogen: 0.2    (Normal Range: 0-1) Nitrite: negative   (Normal Range: Negative) Leukocyte Esterace: 1+   (Normal Range: Negative)    Comments: ...................................................................Milica Zimonjic  May 30, 2007 11:11 AM

## 2010-08-30 NOTE — Assessment & Plan Note (Signed)
Summary: ?uti/pain in lower abdomen/no appetite/cjr   Vital Signs:  Patient profile:   77 year old female Weight:      146 pounds Temp:     98.0 degrees F oral Pulse rate:   78 / minute BP sitting:   144 / 82  (left arm) Cuff size:   regular  Vitals Entered By: Alfred Levins, CMA (August 31, 2009 9:42 AM) CC: lower abd pain   History of Present Illness: Here for 2 weeks of intermittent urgency to urinate and lower abdominal cramps. Her BMs are normal. No nausea of fever.   Current Medications (verified): 1)  Plavix 75 Mg Tabs (Clopidogrel Bisulfate) .... Take 1 Tab By Mouth Daily 2)  Hydrochlorothiazide 25 Mg  Tabs (Hydrochlorothiazide) .Marland Kitchen.. 1 By Mouth Once Daily 3)  Protonix 40 Mg  Pack (Pantoprazole Sodium) .Marland Kitchen.. 1 By Mouth Once Daily 4)  Atenolol 100 Mg Tabs (Atenolol) .Marland Kitchen.. 1 By Mouth Once Daily 5)  Avapro 300 Mg Tabs (Irbesartan) .... Once Daily 6)  Lorazepam 0.5 Mg Tabs (Lorazepam) .Marland Kitchen.. 1 Q 6 Hours As Needed Anxiety 7)  Cyanocobalamin 1000 Mcg/ml Soln (Cyanocobalamin) .... Inject Weekly 8)  Syringe 2-3 Ml 3 Ml Misc (Syringe (Disposable)) .... Use As Directed 9)  Bd Disp Needle 25g X 1" Misc (Needle (Disp)) .... Use As Directed 10)  Simvastatin 20 Mg Tabs (Simvastatin) .Marland Kitchen.. 1 By Mouth At Bedtime 11)  Prednisone 10 Mg Tabs (Prednisone) .... As Directed  Allergies (verified): 1)  ! Macrobid 2)  ! Lisinopril 3)  ! Amoxicillin  Past History:  Past Medical History: High Cholesterol Shingles GERD Hypertension diverticulitis esophageal strictures internal and external hemmorrhoids, per Dr. Derrell Lolling. Gets injections about once a year. vertigo right posterior cerebral artery stroke 2010, sees Dr. Pearlean Brownie  Past Surgical History: Reviewed history from 06/28/2009 and no changes required. Appendectomy Hysterectomy Tonsillectomy colonoscopy 12-09-08  per Dr. Jarold Motto (incomplete) along with barium enema 12-10-08 (hemorrhoids and tics) EGD with dilatation 3-06 per Dr.  Jarold Motto benign lesion removed from left face per Dr. Lonni Fix Cataract extraction per Dr. Dione Booze  Review of Systems  The patient denies anorexia, fever, weight loss, weight gain, vision loss, decreased hearing, hoarseness, chest pain, syncope, dyspnea on exertion, peripheral edema, prolonged cough, headaches, hemoptysis, melena, hematochezia, severe indigestion/heartburn, hematuria, incontinence, genital sores, muscle weakness, suspicious skin lesions, transient blindness, difficulty walking, depression, unusual weight change, abnormal bleeding, enlarged lymph nodes, angioedema, breast masses, and testicular masses.    Physical Exam  General:  Well-developed,well-nourished,in no acute distress; alert,appropriate and cooperative throughout examination Lungs:  Normal respiratory effort, chest expands symmetrically. Lungs are clear to auscultation, no crackles or wheezes. Heart:  Normal rate and regular rhythm. S1 and S2 normal without gallop, murmur, click, rub or other extra sounds. Abdomen:  soft, normal bowel sounds, no distention, no masses, no guarding, no rigidity, no rebound tenderness, no abdominal hernia, no hepatomegaly, and no splenomegaly.  Mildly tender above the pubis   Impression & Recommendations:  Problem # 1:  ACUTE CYSTITIS (ICD-595.0)  Her updated medication list for this problem includes:    Ciprofloxacin Hcl 500 Mg Tabs (Ciprofloxacin hcl) .Marland Kitchen..Marland Kitchen Two times a day  Orders: UA Dipstick w/o Micro (manual) (62952)  Complete Medication List: 1)  Plavix 75 Mg Tabs (Clopidogrel bisulfate) .... Take 1 tab by mouth daily 2)  Hydrochlorothiazide 25 Mg Tabs (Hydrochlorothiazide) .Marland Kitchen.. 1 by mouth once daily 3)  Protonix 40 Mg Pack (Pantoprazole sodium) .Marland Kitchen.. 1 by mouth once daily 4)  Atenolol 100 Mg  Tabs (Atenolol) .Marland Kitchen.. 1 by mouth once daily 5)  Avapro 300 Mg Tabs (Irbesartan) .... Once daily 6)  Lorazepam 0.5 Mg Tabs (Lorazepam) .Marland Kitchen.. 1 q 6 hours as needed anxiety 7)   Cyanocobalamin 1000 Mcg/ml Soln (Cyanocobalamin) .... Inject weekly 8)  Syringe 2-3 Ml 3 Ml Misc (Syringe (disposable)) .... Use as directed 9)  Bd Disp Needle 25g X 1" Misc (Needle (disp)) .... Use as directed 10)  Simvastatin 20 Mg Tabs (Simvastatin) .Marland Kitchen.. 1 by mouth at bedtime 11)  Ciprofloxacin Hcl 500 Mg Tabs (Ciprofloxacin hcl) .... Two times a day  Patient Instructions: 1)  Please schedule a follow-up appointment as needed .  Drink plenty of water Prescriptions: CIPROFLOXACIN HCL 500 MG TABS (CIPROFLOXACIN HCL) two times a day  #20 x 0   Entered and Authorized by:   Nelwyn Salisbury MD   Signed by:   Nelwyn Salisbury MD on 08/31/2009   Method used:   [facility] to        [facility]  [address] [phone][facility] ([facility]ail)       [address]       [place], [place]  [address]       Ph: [phone]       Fax: [phone]   RxID:   [id number]   Laboratory Results   Urine Tests  Date/Time Received: August 31, 2009 9:43 AM Date/Time Reported: August 31, 2009 9:43 AM  Routine Urinalysis   Color: lt. yellow Appearance: Clear Glucose: negative   (Normal Range: Negative) Bilirubin: negative   (Normal Range: Negative) Ketone: negative   (Normal Range: Negative) Spec. Gravity: <1.005   (Normal Range: 1.003-1.035) Blood: negative   (Normal Range: Negative) pH: 5.0   (Normal Range: 5.0-8.0) Protein: negative   (Normal Range: Negative) Urobilinogen: 0.2   (Normal Range: 0-1) Nitrite: negative   (Normal Range: Negative) Leukocyte Esterace: small   (Normal Range: Negative)    Comments: Alfred Levins, CMA  August 31, 2009 9:50 AM

## 2010-08-30 NOTE — Assessment & Plan Note (Signed)
Summary: emp/patient coming in fasting/jls   Vital Signs:  Patient Profile:   77 Years Old Female Height:     61.75 inches Weight:      149 pounds Temp:     97.5 degrees F oral Pulse rate:   98 / minute BP sitting:   170 / 88  (left arm) Cuff size:   regular  Vitals Entered By: Alfred Levins, CMA (June 24, 2008 9:02 AM)                 Chief Complaint:  cpx and fasting.  History of Present Illness: 77 yr old female for cpx. Feels good except occasionally  gets a little constipated. BP is stable. She watches her diet closely. Hemorrhoids are stable. GERD is stable.     Current Allergies (reviewed today): ! MACROBID ! LISINOPRIL ! AMOXICILLIN  Past Medical History:    Reviewed history from 05/30/2007 and no changes required:       High Cholesterol       Shingles       GERD       Hypertension       diverticulitis       esophageal strictures       hemmorrhoids, per Dr. Derrell Lolling       vertigo  Past Surgical History:    Reviewed history from 05/30/2007 and no changes required:       Appendectomy       Hysterectomy       Tonsillectomy       colonoscopy 8-03 per Dr. Jarold Motto       EGD with dilatation 3-06 per Dr. Jarold Motto       benign lesion removed from left face per Dr. Lonni Fix       Cataract extraction per Dr. Dione Booze   Family History:    Reviewed history from 03/19/2007 and no changes required:       Family History Breast cancer 1st degree relative <50       Family History Diabetes 1st degree relative       Family History Hypertension       Family History of Stroke M 1st degree relative <50       Family History of Cardiovascular disorder  Social History:    Reviewed history from 03/19/2007 and no changes required:       Retired       Married       Never Smoked       Alcohol use-no       Drug use-no       Regular exercise-yes    Review of Systems  The patient denies anorexia, fever, weight loss, weight gain, vision loss, decreased hearing,  hoarseness, chest pain, syncope, dyspnea on exertion, peripheral edema, prolonged cough, headaches, hemoptysis, abdominal pain, melena, hematochezia, severe indigestion/heartburn, hematuria, incontinence, genital sores, muscle weakness, suspicious skin lesions, transient blindness, difficulty walking, depression, unusual weight change, abnormal bleeding, enlarged lymph nodes, angioedema, breast masses, and testicular masses.     Physical Exam  General:     Well-developed,well-nourished,in no acute distress; alert,appropriate and cooperative throughout examination Head:     Normocephalic and atraumatic without obvious abnormalities. No apparent alopecia or balding. Eyes:     No corneal or conjunctival inflammation noted. EOMI. Perrla. Funduscopic exam benign, without hemorrhages, exudates or papilledema. Vision grossly normal. Ears:     External ear exam shows no significant lesions or deformities.  Otoscopic examination reveals clear canals, tympanic membranes are intact bilaterally without bulging,  retraction, inflammation or discharge. Hearing is grossly normal bilaterally. Nose:     External nasal examination shows no deformity or inflammation. Nasal mucosa are pink and moist without lesions or exudates. Mouth:     Oral mucosa and oropharynx without lesions or exudates.  Teeth in good repair. Neck:     No deformities, masses, or tenderness noted. Chest Wall:     No deformities, masses, or tenderness noted. Breasts:     No mass, nodules, thickening, tenderness, bulging, retraction, inflamation, nipple discharge or skin changes noted.   Lungs:     Normal respiratory effort, chest expands symmetrically. Lungs are clear to auscultation, no crackles or wheezes. Heart:     Normal rate and regular rhythm. S1 and S2 normal without gallop, murmur, click, rub or other extra sounds. EKG normal. Abdomen:     Bowel sounds positive,abdomen soft and non-tender without masses, organomegaly or hernias  noted. Msk:     No deformity or scoliosis noted of thoracic or lumbar spine.   Pulses:     R and L carotid,radial,femoral,dorsalis pedis and posterior tibial pulses are full and equal bilaterally Extremities:     No clubbing, cyanosis, edema, or deformity noted with normal full range of motion of all joints.   Neurologic:     No cranial nerve deficits noted. Station and gait are normal. Plantar reflexes are down-going bilaterally. DTRs are symmetrical throughout. Sensory, motor and coordinative functions appear intact. Skin:     Intact without suspicious lesions or rashes Cervical Nodes:     No lymphadenopathy noted Axillary Nodes:     No palpable lymphadenopathy Inguinal Nodes:     No significant adenopathy Psych:     Cognition and judgment appear intact. Alert and cooperative with normal attention span and concentration. No apparent delusions, illusions, hallucinations    Impression & Recommendations:  Problem # 1:  CONSTIPATION (ICD-564.00)  Problem # 2:  HEMORRHOIDS, EXTERNAL THROMBOSED (ICD-455.4)  Problem # 3:  HYPERLIPIDEMIA (ICD-272.4)  Her updated medication list for this problem includes:    Lipitor 20 Mg Tabs (Atorvastatin calcium) .Marland Kitchen... 1/2 once daily   Problem # 4:  HYPERTENSION (ICD-401.9)  Her updated medication list for this problem includes:    Hydrochlorothiazide 25 Mg Tabs (Hydrochlorothiazide) .Marland Kitchen... 1 by mouth once daily    Atenolol 100 Mg Tabs (Atenolol) .Marland Kitchen... 1 by mouth once daily    Avapro 150 Mg Tabs (Irbesartan) ..... Once daily  Orders: UA Dipstick w/o Micro (automated)  (81003) EKG w/ Interpretation (93000) Venipuncture (16109) TLB-Lipid Panel (80061-LIPID) TLB-BMP (Basic Metabolic Panel-BMET) (80048-METABOL) TLB-CBC Platelet - w/Differential (85025-CBCD) TLB-Hepatic/Liver Function Pnl (80076-HEPATIC) TLB-TSH (Thyroid Stimulating Hormone) (84443-TSH)   Problem # 5:  GERD (ICD-530.81)  Her updated medication list for this problem  includes:    Protonix 40 Mg Pack (Pantoprazole sodium) .Marland Kitchen... 1 by mouth once daily   Complete Medication List: 1)  Lipitor 20 Mg Tabs (Atorvastatin calcium) .... 1/2 once daily 2)  Adult Aspirin Low Strength 81 Mg Tbdp (Aspirin) .Marland Kitchen.. 1 by mouth once daily 3)  Hydrochlorothiazide 25 Mg Tabs (Hydrochlorothiazide) .Marland Kitchen.. 1 by mouth once daily 4)  Protonix 40 Mg Pack (Pantoprazole sodium) .Marland Kitchen.. 1 by mouth once daily 5)  Atenolol 100 Mg Tabs (Atenolol) .Marland Kitchen.. 1 by mouth once daily 6)  Avapro 150 Mg Tabs (Irbesartan) .... Once daily   Patient Instructions: 1)  Get labs.  2)  It is important that you exercise regularly at least 20 minutes 5 times a week. If you develop chest pain,  have severe difficulty breathing, or feel very tired , stop exercising immediately and seek medical attention.   Prescriptions: ATENOLOL 100 MG TABS (ATENOLOL) 1 by mouth once daily  #30 x 11   Entered and Authorized by:   Nelwyn Salisbury MD   Signed by:   Nelwyn Salisbury MD on 06/24/2008   Method used:   [facility]        [facility]  [address] [phone]* (retail)       [address]       [place], [place]  [address]       Ph: [phone] or [phone]       Fax: [phone]   RxID:   [id number] LIPITOR 20 MG TABS (ATORVASTATIN CALCIUM) 1/2 once daily  #30 x 11   Entered and Authorized by:   Nelwyn Salisbury MD   Signed by:   Nelwyn Salisbury MD on 06/24/2008   Method used:   [facility] to        [facility]  [address] [phone]* (retail)       [address]       [place], [place]  [address]       Ph: [phone] or [phone]       Fax: [phone]   RxID:   [id number]  ]  Appended Document: emp/patient coming in fasting/jls  Laboratory Results   Urine Tests    Routine Urinalysis   Color: yellow Appearance: Clear Glucose: negative   (Normal Range: Negative) Bilirubin: negative   (Normal Range: Negative) Ketone: negative   (Normal Range:  Negative) Spec. Gravity: 1.015   (Normal Range: 1.003-1.035) Blood: negative   (Normal Range: Negative) pH: 5.5   (Normal Range: 5.0-8.0) Protein: negative   (Normal Range: Negative) Urobilinogen: 0.2   (Normal Range: 0-1) Nitrite: negative   (Normal Range: Negative) Leukocyte Esterace: trace   (Normal Range: Negative)    Comments: Rita Ohara  June 24, 2008 12:09 PM

## 2010-09-01 NOTE — Progress Notes (Signed)
Summary: refill lorazepam   Phone Note From Pharmacy   [facility] #[phone] Summary of Call: refill lorazepam  Initial call taken by: Pura Spice, RN,  August 03, 2010 9:35 AM  Follow-up for Phone Call        call in #60 with 5 rf Follow-up by: Nelwyn Salisbury MD,  August 05, 2010 8:47 AM  Additional Follow-up for Phone Call Additional follow up Details #1::        called  Additional Follow-up by: Pura Spice, RN,  August 05, 2010 11:01 AM    New/Updated Medications: LORAZEPAM 0.5 MG TABS (LORAZEPAM) 1 q 6 hours as needed anxiety Prescriptions: LORAZEPAM 0.5 MG TABS (LORAZEPAM) 1 q 6 hours as needed anxiety  #60 x 5   Entered by:   Pura Spice, RN   Authorized by:   Nelwyn Salisbury MD   Signed by:   Pura Spice, RN on 08/05/2010   Method used:   Telephoned to ...       [facility]  [address] [id number]       [address]       [place], [place]  [address]       Ph: [phone]       Fax: [phone]   RxID:   [id number]

## 2010-10-01 ENCOUNTER — Other Ambulatory Visit: Payer: Self-pay | Admitting: Family Medicine

## 2010-10-06 ENCOUNTER — Other Ambulatory Visit: Payer: Self-pay | Admitting: Family Medicine

## 2010-11-01 LAB — CBC
HCT: 37.5 % (ref 36.0–46.0)
HCT: 40.1 % (ref 36.0–46.0)
Hemoglobin: 12.9 g/dL (ref 12.0–15.0)
Hemoglobin: 13.7 g/dL (ref 12.0–15.0)
MCHC: 34.2 g/dL (ref 30.0–36.0)
MCHC: 34.5 g/dL (ref 30.0–36.0)
MCV: 93.3 fL (ref 78.0–100.0)
MCV: 93.5 fL (ref 78.0–100.0)
Platelets: 208 10*3/uL (ref 150–400)
Platelets: 256 10*3/uL (ref 150–400)
RBC: 4.02 MIL/uL (ref 3.87–5.11)
RBC: 4.28 MIL/uL (ref 3.87–5.11)
RDW: 12.3 % (ref 11.5–15.5)
RDW: 12.6 % (ref 11.5–15.5)
WBC: 7.6 10*3/uL (ref 4.0–10.5)
WBC: 9.1 10*3/uL (ref 4.0–10.5)

## 2010-11-01 LAB — BASIC METABOLIC PANEL
BUN: 21 mg/dL (ref 6–23)
BUN: 24 mg/dL — ABNORMAL HIGH (ref 6–23)
CO2: 22 mEq/L (ref 19–32)
CO2: 25 mEq/L (ref 19–32)
Calcium: 8.8 mg/dL (ref 8.4–10.5)
Calcium: 8.8 mg/dL (ref 8.4–10.5)
Chloride: 104 mEq/L (ref 96–112)
Chloride: 106 mEq/L (ref 96–112)
Creatinine, Ser: 0.97 mg/dL (ref 0.4–1.2)
Creatinine, Ser: 1.26 mg/dL — ABNORMAL HIGH (ref 0.4–1.2)
GFR calc non Af Amer: 40 mL/min — ABNORMAL LOW (ref >60)
GFR calc non Af Amer: 55 mL/min — ABNORMAL LOW (ref >60)
Glucose, Bld: 118 mg/dL — ABNORMAL HIGH (ref 70–99)
Glucose, Bld: 138 mg/dL — ABNORMAL HIGH (ref 70–99)
Potassium: 4.5 mEq/L (ref 3.5–5.1)
Potassium: 4.6 mEq/L (ref 3.5–5.1)
Sodium: 136 mEq/L (ref 135–145)
Sodium: 137 mEq/L (ref 135–145)

## 2010-11-01 LAB — URINE CULTURE: Colony Count: 50000

## 2010-11-01 LAB — DIFFERENTIAL
Basophils Absolute: 0 10*3/uL (ref 0.0–0.1)
Basophils Relative: 1 % (ref 0–1)
Eosinophils Absolute: 0.1 10*3/uL (ref 0.0–0.7)
Eosinophils Relative: 2 % (ref 0–5)
Lymphocytes Relative: 17 % (ref 12–46)
Lymphs Abs: 1.3 10*3/uL (ref 0.7–4.0)
Monocytes Absolute: 0.9 10*3/uL (ref 0.1–1.0)
Monocytes Relative: 12 % (ref 3–12)
Neutro Abs: 5.2 10*3/uL (ref 1.7–7.7)
Neutrophils Relative %: 69 % (ref 43–77)

## 2010-11-01 LAB — COMPREHENSIVE METABOLIC PANEL
ALT: 103 U/L — ABNORMAL HIGH (ref 0–35)
AST: 49 U/L — ABNORMAL HIGH (ref 0–37)
Albumin: 3.3 g/dL — ABNORMAL LOW (ref 3.5–5.2)
Alkaline Phosphatase: 255 U/L — ABNORMAL HIGH (ref 39–117)
BUN: 23 mg/dL (ref 6–23)
CO2: 25 mEq/L (ref 19–32)
Calcium: 8.9 mg/dL (ref 8.4–10.5)
Chloride: 103 mEq/L (ref 96–112)
Creatinine, Ser: 1.28 mg/dL — ABNORMAL HIGH (ref 0.4–1.2)
GFR calc non Af Amer: 40 mL/min — ABNORMAL LOW (ref >60)
Glucose, Bld: 121 mg/dL — ABNORMAL HIGH (ref 70–99)
Potassium: 4.3 mEq/L (ref 3.5–5.1)
Sodium: 136 mEq/L (ref 135–145)
Total Bilirubin: 1 mg/dL (ref 0.3–1.2)
Total Protein: 7.5 g/dL (ref 6.0–8.3)

## 2010-11-01 LAB — CARDIAC PANEL(CRET KIN+CKTOT+MB+TROPI)
CK, MB: 1.1 ng/mL (ref 0.3–4.0)
Total CK: 58 U/L (ref 7–177)

## 2010-11-01 LAB — LIPID PANEL
Cholesterol: 245 mg/dL — ABNORMAL HIGH (ref 0–200)
HDL: 69 mg/dL (ref >39)
LDL Cholesterol: 157 mg/dL — ABNORMAL HIGH (ref 0–99)
Total CHOL/HDL Ratio: 3.6 RATIO
Triglycerides: 95 mg/dL (ref <150)
VLDL: 19 mg/dL (ref 0–40)

## 2010-11-01 LAB — HEMOGLOBIN A1C: Mean Plasma Glucose: 123 mg/dL

## 2010-11-01 LAB — SEDIMENTATION RATE
Sed Rate: 112 mm/hr — ABNORMAL HIGH (ref 0–22)
Sed Rate: 120 mm/hr — ABNORMAL HIGH (ref 0–22)

## 2010-11-01 LAB — PROTIME-INR
INR: 1.06 (ref 0.00–1.49)
Prothrombin Time: 13.7 seconds (ref 11.6–15.2)

## 2010-11-01 LAB — APTT: aPTT: 36 seconds (ref 24–37)

## 2010-12-07 ENCOUNTER — Other Ambulatory Visit: Payer: Self-pay | Admitting: Family Medicine

## 2010-12-16 NOTE — Consult Note (Signed)
Christine Middleton, Christine Middleton                 ACCOUNT NO.:  [id number]   MEDICAL RECORD NO.:  [id number]          PATIENT TYPE:  EMS   LOCATION:  MAJO                         FACILITY:  [facility]   PHYSICIAN:  Althea Grimmer. Santogade, M.D.DATE OF BIRTH:  [birth year]   DATE OF CONSULTATION:  09/16/2004  DATE OF DISCHARGE:  09/16/2004                                   CONSULTATION   GASTROENTEROLOGY CONSULTATION   HISTORY:  Ms. Butrick is an 77 year old female whom I am asked to see in the  emergency room because of a sensation of food caught in her throat.  She  says at breakfast this morning she was eating toast and grits and suddenly  could not swallow.  She had to vomit anything she subsequently swallowed and  fluids such as water and coffee would not go down afterwards.  She has a  frequent history of heartburn at night for which she takes over the counter  medications.  In addition, she has been on Fosamax until five years ago and  currently is on Actonel once per week.  She denies chest or abdominal pain.  She has had no hematemesis or melena. She reports to me that she had a  colonoscopy a few weeks ago by Dr. Sheryn Bison.  Internal hemorrhoids  were noted and she just underwent injection therapy by Dr. Orson Slick.   CURRENT MEDICATIONS:  Actonel and unknown blood pressure medication.   SOCIAL HISTORY:  Married, nonsmoker, nondrinker.   PHYSICAL EXAMINATION:  GENERAL APPEARANCE:  She appears in no acute  distress.  VITAL SIGNS: Normal.  HEENT: Eyes are anicteric. Oropharynx unremarkable.  NECK:  Supple without thyromegaly.  There is no cervical adenopathy.  CHEST:  Sounds clear.  HEART:  Sounds regular rate and rhythm without murmurs, rubs or gallops.  ABDOMEN:  Is soft and nontender without masses or organomegaly.   IMPRESSION:  Possible food impaction of the esophagus.  Patient may have an  underlying stricture from reflux or from medications to prevent  osteoporosis.   PLAN:  Upper  endoscopy was initially recommended to the patient who said she  had a sensation that something had passed and she was now able to swallow  her saliva.  She was very reluctant to undergo either an upper endoscopy or  a barium swallow x-ray.  For this reason, she was seated upright and I  observed her drink a glass of water without  any difficulty whatsoever.  Since she refuses endoscopy at this time, I have  given her anti-reflux advice and advised over the counter Pepcid or  omeprazole as directed.  She will follow up with either her primary doctor,  Dr. Gershon Crane or Dr. Sheryn Bison.      PJS/MEDQ  D:  09/16/2004  T:  09/16/2004  Job:  [id number]   cc:   Vania Rea. Jarold Motto, M.D. [facility]   Jeannett Senior A. Clent Ridges, M.D. [facility]

## 2010-12-16 NOTE — Assessment & Plan Note (Signed)
[facility]                              [facility] OFFICE NOTE   NAME:Christine Middleton, Christine Middleton                        MRN:          [id number]  DATE:05/28/2006                            DOB:          06-11-1923    This is an 77 year old woman here for an physical examination.  In general,  she is doing well and has no complaints.  She did have a mammogram about two  weeks ago and she got the report back that this was normal.  She continues  to watch her diet but admits to not getting much exercise during the hot  summer months.  Her acid reflux has been stable.  Further details of her  past medical history, family history, social history as per the last  physical note dated May 24, 2005.   ALLERGIES:  1. MACROBID.  2. LISINOPRIL.   CURRENT MEDICATIONS:  1. Lipitor 20 mg 1/2 tablet per day.  2. Aspirin 81 mg per day.  3. HCTZ 25 mg per day.  4. Protonix 40 mg per day.  5. Meclizine 25 mg a 1/2 tablet as needed for dizziness.   OBJECTIVE:  Height 5 foot 2 inches.  Weight 146.  BP 144/92, pulse 76 and  regular.  GENERAL:  She appears to be at her baseline.  SKIN:  Clear.  HEENT:  Eyes are clear.  She wears glasses.  Ears are clear.  Pharynx is  clear.  Neck supple without lymphadenopathy, masses.  LUNGS:  Clear.  CARDIAC:  Rate and rhythm regular without gallops, murmurs or rubs.  Distal  pulses are full.  EKG is within normal limits.  BREASTS/AXILLA:  Clear.  ABDOMEN:  Soft, normal bowel sounds, nontender.  No masses.  RECTAL:  No masses or tenderness.  Stool hemoccult negative.  EXTREMITIES:  No clubbing, cyanosis, or edema.  NEUROLOGICAL:  Exam is grossly intact.   ASSESSMENT/PLAN:  1. Complete physical:  We talked about getting more exercise.  2. Hypertension:  In addition to her HCTZ, we will begin atenolol 25 mg      once daily.  3. Hyperlipidemia:  We will check laboratories today including lipid      panel.  4. Gastroesophageal  reflux disease:  Stable.  5. She was given a flu shot.    ______________________________  Tera Mater. Clent Ridges, MD    SAF/MedQ  DD: 05/28/2006  DT: 05/29/2006  Job #: [id number]

## 2011-01-29 ENCOUNTER — Other Ambulatory Visit: Payer: Self-pay | Admitting: Family Medicine

## 2011-03-30 ENCOUNTER — Other Ambulatory Visit: Payer: Self-pay | Admitting: Family Medicine

## 2011-04-28 ENCOUNTER — Telehealth: Payer: Self-pay | Admitting: Family Medicine

## 2011-04-28 MED ORDER — CLOPIDOGREL BISULFATE 75 MG PO TABS: 75.0000 mg | ORAL_TABLET | Freq: Every day | ORAL | Status: DC

## 2011-04-28 NOTE — Telephone Encounter (Signed)
Script was sent e-scribe 

## 2011-06-27 ENCOUNTER — Other Ambulatory Visit: Payer: Self-pay | Admitting: Family Medicine

## 2011-07-03 ENCOUNTER — Encounter: Payer: Self-pay | Admitting: Family Medicine

## 2011-07-03 ENCOUNTER — Ambulatory Visit (INDEPENDENT_AMBULATORY_CARE_PROVIDER_SITE_OTHER): Payer: Medicare Other | Admitting: Family Medicine

## 2011-07-03 VITALS — BP 118/82 | HR 69 | Temp 97.9°F | Ht 61.5 in | Wt 142.0 lb

## 2011-07-03 DIAGNOSIS — Z0000 Encounter for general adult medical examination without abnormal findings: Secondary | ICD-10-CM

## 2011-07-03 DIAGNOSIS — Z79899 Other long term (current) drug therapy: Secondary | ICD-10-CM

## 2011-07-03 DIAGNOSIS — E538 Deficiency of other specified B group vitamins: Secondary | ICD-10-CM

## 2011-07-03 LAB — LIPID PANEL
Cholesterol: 193 mg/dL (ref 0–200)
HDL: 72.7 mg/dL (ref >39.00)
LDL Cholesterol: 98 mg/dL (ref 0–99)
Total CHOL/HDL Ratio: 3
Triglycerides: 112 mg/dL (ref 0.0–149.0)
VLDL: 22.4 mg/dL (ref 0.0–40.0)

## 2011-07-03 LAB — POCT URINALYSIS DIPSTICK
Bilirubin, UA: NEGATIVE
Blood, UA: NEGATIVE
Glucose, UA: NEGATIVE
Ketones, UA: NEGATIVE
Nitrite, UA: NEGATIVE
Protein, UA: NEGATIVE
Spec Grav, UA: 1.015
Urobilinogen, UA: 0.2
pH, UA: 7

## 2011-07-03 LAB — BASIC METABOLIC PANEL
BUN: 14 mg/dL (ref 6–23)
CO2: 30 mEq/L (ref 19–32)
Calcium: 9 mg/dL (ref 8.4–10.5)
Chloride: 102 mEq/L (ref 96–112)
Creatinine, Ser: 0.9 mg/dL (ref 0.4–1.2)
GFR: 62.96 mL/min (ref >60.00)
Glucose, Bld: 108 mg/dL — ABNORMAL HIGH (ref 70–99)
Potassium: 3.9 mEq/L (ref 3.5–5.1)
Sodium: 140 mEq/L (ref 135–145)

## 2011-07-03 LAB — HEPATIC FUNCTION PANEL
ALT: 18 U/L (ref 0–35)
AST: 20 U/L (ref 0–37)
Albumin: 3.9 g/dL (ref 3.5–5.2)
Alkaline Phosphatase: 82 U/L (ref 39–117)
Bilirubin, Direct: 0.2 mg/dL (ref 0.0–0.3)
Total Bilirubin: 1 mg/dL (ref 0.3–1.2)
Total Protein: 7.3 g/dL (ref 6.0–8.3)

## 2011-07-03 LAB — CBC WITH DIFFERENTIAL/PLATELET
Basophils Absolute: 0 10*3/uL (ref 0.0–0.1)
Basophils Relative: 0.5 % (ref 0.0–3.0)
Eosinophils Absolute: 0 10*3/uL (ref 0.0–0.7)
Eosinophils Relative: 0 % (ref 0.0–5.0)
HCT: 37.7 % (ref 36.0–46.0)
Hemoglobin: 13 g/dL (ref 12.0–15.0)
Lymphocytes Relative: 18.8 % (ref 12.0–46.0)
Lymphs Abs: 1.3 10*3/uL (ref 0.7–4.0)
MCHC: 34.5 g/dL (ref 30.0–36.0)
MCV: 91.9 fl (ref 78.0–100.0)
Monocytes Absolute: 0.7 10*3/uL (ref 0.1–1.0)
Monocytes Relative: 10.2 % (ref 3.0–12.0)
Neutro Abs: 5 10*3/uL (ref 1.4–7.7)
Neutrophils Relative %: 70.5 % (ref 43.0–77.0)
Platelets: 221 10*3/uL (ref 150.0–400.0)
RBC: 4.1 Mil/uL (ref 3.87–5.11)
RDW: 12.5 % (ref 11.5–14.6)
WBC: 7.1 10*3/uL (ref 4.5–10.5)

## 2011-07-03 LAB — TSH: TSH: 0.65 u[IU]/mL (ref 0.35–5.50)

## 2011-07-03 LAB — VITAMIN B12: Vitamin B-12: 312 pg/mL (ref 211–911)

## 2011-07-03 NOTE — Progress Notes (Signed)
  Subjective:    Patient ID: Christine Middleton, female    DOB: 1922-09-03, 77 y.o.   MRN: [id number]  HPI 77 yr old female for a cpx. She feels well except for some burning sensations in both feet. She had been taking B12 shots but stopped one year ago. Otherwise she stays busy and does well.    Review of Systems  Constitutional: Negative.   HENT: Negative.   Eyes: Negative.   Respiratory: Negative.   Cardiovascular: Negative.   Gastrointestinal: Negative.   Genitourinary: Negative for dysuria, urgency, frequency, hematuria, flank pain, decreased urine volume, enuresis, difficulty urinating, pelvic pain and dyspareunia.  Musculoskeletal: Negative.   Skin: Negative.   Neurological: Negative.   Hematological: Negative.   Psychiatric/Behavioral: Negative.        Objective:   Physical Exam  Constitutional: She is oriented to person, place, and time. She appears well-developed and well-nourished. No distress.  HENT:  Head: Normocephalic and atraumatic.  Right Ear: External ear normal.  Left Ear: External ear normal.  Nose: Nose normal.  Mouth/Throat: Oropharynx is clear and moist. No oropharyngeal exudate.  Eyes: Conjunctivae and EOM are normal. Pupils are equal, round, and reactive to light. No scleral icterus.  Neck: Normal range of motion. Neck supple. No JVD present. No thyromegaly present.  Cardiovascular: Normal rate, regular rhythm, normal heart sounds and intact distal pulses.  Exam reveals no gallop and no friction rub.   No murmur heard.      EKG normal   Pulmonary/Chest: Effort normal and breath sounds normal. No respiratory distress. She has no wheezes. She has no rales. She exhibits no tenderness.  Abdominal: Soft. Bowel sounds are normal. She exhibits no distension and no mass. There is no tenderness. There is no rebound and no guarding.  Musculoskeletal: Normal range of motion. She exhibits no edema and no tenderness.  Lymphadenopathy:    She has no cervical adenopathy.    Neurological: She is alert and oriented to person, place, and time. She has normal reflexes. No cranial nerve deficit. She exhibits normal muscle tone. Coordination normal.  Skin: Skin is warm and dry. No rash noted. No erythema.  Psychiatric: She has a normal mood and affect. Her behavior is normal. Judgment and thought content normal.          Assessment & Plan:  Well exam. Get fasting labs. Check a B12 level for the neuropathy.

## 2011-07-06 NOTE — Progress Notes (Signed)
Quick Note:  Spoke with pt ______ 

## 2011-07-19 ENCOUNTER — Encounter: Payer: Self-pay | Admitting: Family Medicine

## 2011-07-26 ENCOUNTER — Other Ambulatory Visit: Payer: Self-pay | Admitting: Family Medicine

## 2011-08-14 ENCOUNTER — Other Ambulatory Visit: Payer: Self-pay | Admitting: Family Medicine

## 2011-09-22 ENCOUNTER — Other Ambulatory Visit: Payer: Self-pay | Admitting: Family Medicine

## 2011-10-03 ENCOUNTER — Other Ambulatory Visit: Payer: Self-pay | Admitting: Family Medicine

## 2011-11-14 ENCOUNTER — Other Ambulatory Visit: Payer: Self-pay | Admitting: Family Medicine

## 2012-01-15 ENCOUNTER — Other Ambulatory Visit: Payer: Self-pay | Admitting: Family Medicine

## 2012-02-07 ENCOUNTER — Other Ambulatory Visit: Payer: Self-pay | Admitting: Family Medicine

## 2012-04-04 ENCOUNTER — Other Ambulatory Visit: Payer: Self-pay | Admitting: Family Medicine

## 2012-06-20 ENCOUNTER — Other Ambulatory Visit: Payer: Self-pay | Admitting: Family Medicine

## 2012-06-30 ENCOUNTER — Other Ambulatory Visit: Payer: Self-pay | Admitting: Family Medicine

## 2012-07-18 ENCOUNTER — Ambulatory Visit (INDEPENDENT_AMBULATORY_CARE_PROVIDER_SITE_OTHER): Payer: Medicare Other | Admitting: Family Medicine

## 2012-07-18 ENCOUNTER — Encounter: Payer: Self-pay | Admitting: Family Medicine

## 2012-07-18 VITALS — BP 140/80 | HR 75 | Temp 98.0°F | Ht 62.25 in | Wt 143.0 lb

## 2012-07-18 DIAGNOSIS — R7309 Other abnormal glucose: Secondary | ICD-10-CM

## 2012-07-18 DIAGNOSIS — R739 Hyperglycemia, unspecified: Secondary | ICD-10-CM

## 2012-07-18 DIAGNOSIS — E538 Deficiency of other specified B group vitamins: Secondary | ICD-10-CM

## 2012-07-18 DIAGNOSIS — Z0000 Encounter for general adult medical examination without abnormal findings: Secondary | ICD-10-CM

## 2012-07-18 DIAGNOSIS — E785 Hyperlipidemia, unspecified: Secondary | ICD-10-CM

## 2012-07-18 LAB — POCT URINALYSIS DIPSTICK
Bilirubin, UA: NEGATIVE
Blood, UA: NEGATIVE
Glucose, UA: NEGATIVE
Ketones, UA: NEGATIVE
Nitrite, UA: NEGATIVE
Protein, UA: NEGATIVE
Spec Grav, UA: 1.015
Urobilinogen, UA: 0.2
pH, UA: 6.5

## 2012-07-18 LAB — CBC WITH DIFFERENTIAL/PLATELET
Basophils Absolute: 0 10*3/uL (ref 0.0–0.1)
Basophils Relative: 0.6 % (ref 0.0–3.0)
Eosinophils Absolute: 0 10*3/uL (ref 0.0–0.7)
Eosinophils Relative: 0 % (ref 0.0–5.0)
HCT: 40.8 % (ref 36.0–46.0)
Hemoglobin: 13.7 g/dL (ref 12.0–15.0)
Lymphocytes Relative: 20.9 % (ref 12.0–46.0)
Lymphs Abs: 1.6 10*3/uL (ref 0.7–4.0)
MCHC: 33.7 g/dL (ref 30.0–36.0)
MCV: 92 fl (ref 78.0–100.0)
Monocytes Absolute: 0.7 10*3/uL (ref 0.1–1.0)
Monocytes Relative: 9.2 % (ref 3.0–12.0)
Neutro Abs: 5.4 10*3/uL (ref 1.4–7.7)
Neutrophils Relative %: 69.3 % (ref 43.0–77.0)
Platelets: 227 10*3/uL (ref 150.0–400.0)
RBC: 4.43 Mil/uL (ref 3.87–5.11)
RDW: 12.7 % (ref 11.5–14.6)
WBC: 7.8 10*3/uL (ref 4.5–10.5)

## 2012-07-18 LAB — HEMOGLOBIN A1C: Hgb A1c MFr Bld: 6.1 % (ref 4.6–6.5)

## 2012-07-18 LAB — HEPATIC FUNCTION PANEL
ALT: 15 U/L (ref 0–35)
AST: 20 U/L (ref 0–37)
Albumin: 4.1 g/dL (ref 3.5–5.2)
Alkaline Phosphatase: 78 U/L (ref 39–117)
Bilirubin, Direct: 0.1 mg/dL (ref 0.0–0.3)
Total Bilirubin: 1.3 mg/dL — ABNORMAL HIGH (ref 0.3–1.2)
Total Protein: 7.7 g/dL (ref 6.0–8.3)

## 2012-07-18 LAB — BASIC METABOLIC PANEL
BUN: 22 mg/dL (ref 6–23)
CO2: 31 mEq/L (ref 19–32)
Calcium: 9.4 mg/dL (ref 8.4–10.5)
Chloride: 103 mEq/L (ref 96–112)
Creatinine, Ser: 1 mg/dL (ref 0.4–1.2)
GFR: 57.61 mL/min — ABNORMAL LOW (ref >60.00)
Glucose, Bld: 117 mg/dL — ABNORMAL HIGH (ref 70–99)
Potassium: 3.8 mEq/L (ref 3.5–5.1)
Sodium: 142 mEq/L (ref 135–145)

## 2012-07-18 LAB — LDL CHOLESTEROL, DIRECT: Direct LDL: 113.3 mg/dL

## 2012-07-18 LAB — LIPID PANEL
Cholesterol: 204 mg/dL — ABNORMAL HIGH (ref 0–200)
HDL: 76.4 mg/dL (ref >39.00)
Total CHOL/HDL Ratio: 3
Triglycerides: 109 mg/dL (ref 0.0–149.0)
VLDL: 21.8 mg/dL (ref 0.0–40.0)

## 2012-07-18 LAB — TSH: TSH: 0.83 u[IU]/mL (ref 0.35–5.50)

## 2012-07-18 LAB — VITAMIN B12: Vitamin B-12: 284 pg/mL (ref 211–911)

## 2012-07-18 MED ORDER — SIMVASTATIN 20 MG PO TABS: 20.0000 mg | ORAL_TABLET | Freq: Every day | ORAL | Status: DC

## 2012-07-18 MED ORDER — LOSARTAN POTASSIUM 100 MG PO TABS: 100.0000 mg | ORAL_TABLET | Freq: Every day | ORAL | Status: DC

## 2012-07-18 MED ORDER — HYDROCHLOROTHIAZIDE 25 MG PO TABS: 25.0000 mg | ORAL_TABLET | Freq: Every day | ORAL | Status: DC

## 2012-07-18 MED ORDER — PANTOPRAZOLE SODIUM 40 MG PO TBEC: 40.0000 mg | DELAYED_RELEASE_TABLET | Freq: Every day | ORAL | Status: DC

## 2012-07-18 MED ORDER — CLOPIDOGREL BISULFATE 75 MG PO TABS: 75.0000 mg | ORAL_TABLET | Freq: Every day | ORAL | Status: DC

## 2012-07-18 MED ORDER — ATENOLOL 100 MG PO TABS: 100.0000 mg | ORAL_TABLET | Freq: Every day | ORAL | Status: DC

## 2012-07-18 NOTE — Progress Notes (Signed)
  Subjective:    Patient ID: Christine Middleton, female    DOB: 02-Mar-1923, 78 y.o.   MRN: [id number]  HPI 78 yr old female for a cpx. She has been doing well except for some arthritis pains that come and go. She is worried about her husband, who has dementia and is deteriorating fairly quickly.    Review of Systems  Constitutional: Negative.   HENT: Negative.   Eyes: Negative.   Respiratory: Negative.   Cardiovascular: Negative.   Gastrointestinal: Negative.   Genitourinary: Negative for dysuria, urgency, frequency, hematuria, flank pain, decreased urine volume, enuresis, difficulty urinating, pelvic pain and dyspareunia.  Musculoskeletal: Negative.   Skin: Negative.   Neurological: Negative.   Hematological: Negative.   Psychiatric/Behavioral: Negative.        Objective:   Physical Exam  Constitutional: She is oriented to person, place, and time. She appears well-developed and well-nourished. No distress.  HENT:  Head: Normocephalic and atraumatic.  Right Ear: External ear normal.  Left Ear: External ear normal.  Nose: Nose normal.  Mouth/Throat: Oropharynx is clear and moist. No oropharyngeal exudate.  Eyes: Conjunctivae normal and EOM are normal. Pupils are equal, round, and reactive to light. No scleral icterus.  Neck: Normal range of motion. Neck supple. No JVD present. No thyromegaly present.  Cardiovascular: Normal rate, regular rhythm, normal heart sounds and intact distal pulses.  Exam reveals no gallop and no friction rub.   No murmur heard.      EKG normal   Pulmonary/Chest: Effort normal and breath sounds normal. No respiratory distress. She has no wheezes. She has no rales. She exhibits no tenderness.  Abdominal: Soft. Bowel sounds are normal. She exhibits no distension and no mass. There is no tenderness. There is no rebound and no guarding.  Musculoskeletal: Normal range of motion. She exhibits no edema and no tenderness.  Lymphadenopathy:    She has no cervical  adenopathy.  Neurological: She is alert and oriented to person, place, and time. She has normal reflexes. No cranial nerve deficit. She exhibits normal muscle tone. Coordination normal.  Skin: Skin is warm and dry. No rash noted. No erythema.  Psychiatric: She has a normal mood and affect. Her behavior is normal. Judgment and thought content normal.          Assessment & Plan:  Well exam. Get fasting labs. She will set up a mammogram.

## 2012-08-07 ENCOUNTER — Other Ambulatory Visit: Payer: Self-pay | Admitting: Family Medicine

## 2012-09-13 ENCOUNTER — Other Ambulatory Visit: Payer: Self-pay | Admitting: Family Medicine

## 2012-10-01 ENCOUNTER — Other Ambulatory Visit: Payer: Self-pay | Admitting: Family Medicine

## 2012-10-02 ENCOUNTER — Other Ambulatory Visit: Payer: Self-pay | Admitting: Family Medicine

## 2013-03-04 ENCOUNTER — Telehealth: Payer: Self-pay | Admitting: Family Medicine

## 2013-03-04 NOTE — Telephone Encounter (Signed)
Patient Information:  Caller Name: Diahn  Phone: [phone]  Patient: Chauvin, Levette B  Gender: Female  DOB: 26-Apr-1923  Age: 79 Years  PCP: [facility])  Office Follow Up:  Does the office need to follow up with this patient?: Yes  Instructions For The Office: Pt states she can not schedule appt until Friday.  Pt states she needs appt in the AM.  No appt available for Friday with Dr Abran Cantor, PLEASE F/U WITH PT CONCERNING HER APPT NEEDS.  THANK YOU.   Symptoms  Reason For Call & Symptoms: Pt states she has urine odor with tenderness in the neck.  Reviewed Health History In EMR: Yes  Reviewed Medications In EMR: Yes  Reviewed Allergies In EMR: Yes  Reviewed Surgeries / Procedures: Yes  Date of Onset of Symptoms: 02/18/2013  Guideline(s) Used:  Urination Pain - Female  Disposition Per Guideline:   See Today in Office  Reason For Disposition Reached:   Age > 50 years  Advice Given:  Call Back If:   You become worse.  Patient Refused Recommendation:  Patient Refused Appt, Patient Requests Appt At Later Date  Pt states she can not schedule appt until Friday AM.

## 2013-03-04 NOTE — Telephone Encounter (Signed)
Appointment made with Dr Fry 

## 2013-03-07 ENCOUNTER — Ambulatory Visit (INDEPENDENT_AMBULATORY_CARE_PROVIDER_SITE_OTHER): Payer: Self-pay | Admitting: Family Medicine

## 2013-03-07 ENCOUNTER — Encounter: Payer: Self-pay | Admitting: Family Medicine

## 2013-03-07 VITALS — BP 124/80 | HR 71 | Temp 98.0°F | Wt 146.0 lb

## 2013-03-07 DIAGNOSIS — R309 Painful micturition, unspecified: Secondary | ICD-10-CM

## 2013-03-07 DIAGNOSIS — M542 Cervicalgia: Secondary | ICD-10-CM

## 2013-03-07 DIAGNOSIS — N390 Urinary tract infection, site not specified: Secondary | ICD-10-CM

## 2013-03-07 DIAGNOSIS — R300 Dysuria: Secondary | ICD-10-CM

## 2013-03-07 LAB — POCT URINALYSIS DIPSTICK
Bilirubin, UA: NEGATIVE
Glucose, UA: NEGATIVE
Ketones, UA: NEGATIVE
Nitrite, UA: NEGATIVE
Spec Grav, UA: 1.01
Urobilinogen, UA: 0.2
pH, UA: 6.5

## 2013-03-07 MED ORDER — NITROFURANTOIN MONOHYD MACRO 100 MG PO CAPS: 100.0000 mg | ORAL_CAPSULE | Freq: Two times a day (BID) | ORAL | Status: DC

## 2013-03-07 NOTE — Progress Notes (Signed)
  Subjective:    Patient ID: Christine Middleton, female    DOB: Jan 16, 1923, 79 y.o.   MRN: [id number]  HPI Here for 2 things. First for 3 days she has had urinary frequency and burning. No fever. Also she has had some stiffness and mild pain in the neck. No recent trauma.    Review of Systems  Constitutional: Negative.   HENT: Positive for neck pain and neck stiffness.   Genitourinary: Positive for dysuria, urgency and frequency. Negative for hematuria.       Objective:   Physical Exam  Constitutional: She appears well-developed and well-nourished.  Neck: Normal range of motion. Neck supple.  Posterior neck is mildy tender  Abdominal: Soft. Bowel sounds are normal. She exhibits no distension and no mass. There is no tenderness. There is no rebound and no guarding.          Assessment & Plan:  Use heat and Aleve for the neck. Treat the UTI with Macrobid. Drink fluids

## 2013-03-09 LAB — URINE CULTURE: Colony Count: 75000

## 2013-03-25 ENCOUNTER — Other Ambulatory Visit: Payer: Self-pay | Admitting: Family Medicine

## 2013-05-09 ENCOUNTER — Ambulatory Visit (INDEPENDENT_AMBULATORY_CARE_PROVIDER_SITE_OTHER): Payer: Medicare Other | Admitting: Family Medicine

## 2013-05-09 DIAGNOSIS — Z23 Encounter for immunization: Secondary | ICD-10-CM

## 2013-05-13 ENCOUNTER — Other Ambulatory Visit: Payer: Self-pay | Admitting: Family Medicine

## 2013-06-26 ENCOUNTER — Other Ambulatory Visit: Payer: Self-pay | Admitting: Family Medicine

## 2013-07-21 ENCOUNTER — Encounter: Payer: Self-pay | Admitting: Family Medicine

## 2013-07-21 ENCOUNTER — Ambulatory Visit (INDEPENDENT_AMBULATORY_CARE_PROVIDER_SITE_OTHER): Payer: Medicare Other | Admitting: Family Medicine

## 2013-07-21 VITALS — BP 130/70 | HR 70 | Temp 97.8°F | Ht 61.75 in | Wt 145.0 lb

## 2013-07-21 DIAGNOSIS — Z0000 Encounter for general adult medical examination without abnormal findings: Secondary | ICD-10-CM

## 2013-07-21 DIAGNOSIS — I10 Essential (primary) hypertension: Secondary | ICD-10-CM

## 2013-07-21 LAB — CBC WITH DIFFERENTIAL/PLATELET
Basophils Absolute: 0.1 10*3/uL (ref 0.0–0.1)
Basophils Relative: 0.9 % (ref 0.0–3.0)
Eosinophils Absolute: 0.3 10*3/uL (ref 0.0–0.7)
Eosinophils Relative: 4.9 % (ref 0.0–5.0)
HCT: 37.7 % (ref 36.0–46.0)
Hemoglobin: 12.8 g/dL (ref 12.0–15.0)
Lymphocytes Relative: 20.6 % (ref 12.0–46.0)
Lymphs Abs: 1.3 10*3/uL (ref 0.7–4.0)
MCHC: 33.9 g/dL (ref 30.0–36.0)
MCV: 90 fl (ref 78.0–100.0)
Monocytes Absolute: 0.6 10*3/uL (ref 0.1–1.0)
Monocytes Relative: 9.2 % (ref 3.0–12.0)
Neutro Abs: 4.1 10*3/uL (ref 1.4–7.7)
Neutrophils Relative %: 64.4 % (ref 43.0–77.0)
Platelets: 198 10*3/uL (ref 150.0–400.0)
RBC: 4.19 Mil/uL (ref 3.87–5.11)
RDW: 12.5 % (ref 11.5–14.6)
WBC: 6.3 10*3/uL (ref 4.5–10.5)

## 2013-07-21 LAB — BASIC METABOLIC PANEL
BUN: 21 mg/dL (ref 6–23)
CO2: 29 mEq/L (ref 19–32)
Calcium: 9.3 mg/dL (ref 8.4–10.5)
Chloride: 103 mEq/L (ref 96–112)
Creatinine, Ser: 1 mg/dL (ref 0.4–1.2)
GFR: 54.86 mL/min — ABNORMAL LOW (ref >60.00)
Glucose, Bld: 97 mg/dL (ref 70–99)
Potassium: 3.7 mEq/L (ref 3.5–5.1)
Sodium: 141 mEq/L (ref 135–145)

## 2013-07-21 LAB — POCT URINALYSIS DIPSTICK
Bilirubin, UA: NEGATIVE
Blood, UA: NEGATIVE
Glucose, UA: NEGATIVE
Ketones, UA: NEGATIVE
Nitrite, UA: NEGATIVE
Protein, UA: NEGATIVE
Spec Grav, UA: 1.015
Urobilinogen, UA: 1
pH, UA: 6

## 2013-07-21 LAB — LIPID PANEL
Cholesterol: 184 mg/dL (ref 0–200)
HDL: 72 mg/dL (ref >39.00)
LDL Cholesterol: 95 mg/dL (ref 0–99)
Total CHOL/HDL Ratio: 3
Triglycerides: 87 mg/dL (ref 0.0–149.0)
VLDL: 17.4 mg/dL (ref 0.0–40.0)

## 2013-07-21 LAB — HEPATIC FUNCTION PANEL
ALT: 16 U/L (ref 0–35)
AST: 19 U/L (ref 0–37)
Albumin: 4 g/dL (ref 3.5–5.2)
Alkaline Phosphatase: 76 U/L (ref 39–117)
Bilirubin, Direct: 0.2 mg/dL (ref 0.0–0.3)
Total Bilirubin: 1.1 mg/dL (ref 0.3–1.2)
Total Protein: 7.3 g/dL (ref 6.0–8.3)

## 2013-07-21 LAB — TSH: TSH: 0.99 u[IU]/mL (ref 0.35–5.50)

## 2013-07-21 MED ORDER — SIMVASTATIN 20 MG PO TABS: 20.0000 mg | ORAL_TABLET | Freq: Every day | ORAL | Status: DC

## 2013-07-21 MED ORDER — HYDROCHLOROTHIAZIDE 25 MG PO TABS: 25.0000 mg | ORAL_TABLET | Freq: Every day | ORAL | Status: DC

## 2013-07-21 MED ORDER — PANTOPRAZOLE SODIUM 40 MG PO TBEC: 40.0000 mg | DELAYED_RELEASE_TABLET | Freq: Every day | ORAL | Status: DC

## 2013-07-21 MED ORDER — LOSARTAN POTASSIUM 100 MG PO TABS: 100.0000 mg | ORAL_TABLET | Freq: Every day | ORAL | Status: DC

## 2013-07-21 MED ORDER — ATENOLOL 100 MG PO TABS: 100.0000 mg | ORAL_TABLET | Freq: Every day | ORAL | Status: DC

## 2013-07-21 MED ORDER — CLOPIDOGREL BISULFATE 75 MG PO TABS: 75.0000 mg | ORAL_TABLET | Freq: Every day | ORAL | Status: DC

## 2013-07-21 NOTE — Progress Notes (Signed)
Pre visit review using our clinic review tool, if applicable. No additional management support is needed unless otherwise documented below in the visit note. 

## 2013-07-21 NOTE — Progress Notes (Signed)
   Subjective:    Patient ID: Christine Middleton, female    DOB: Mar 23, 1923, 79 y.o.   MRN: [id number]  HPI    Review of Systems     Objective:   Physical Exam        Assessment & Plan:

## 2013-07-22 MED ORDER — SULFAMETHOXAZOLE-TMP DS 800-160 MG PO TABS: 1.0000 | ORAL_TABLET | Freq: Two times a day (BID) | ORAL | Status: DC

## 2013-07-22 NOTE — Addendum Note (Signed)
Addended by: Aniceto Boss A on: 07/22/2013 01:13 PM   Modules accepted: Orders

## 2013-07-24 ENCOUNTER — Other Ambulatory Visit: Payer: Self-pay | Admitting: Family Medicine

## 2013-08-01 ENCOUNTER — Telehealth: Payer: Self-pay | Admitting: Family Medicine

## 2013-08-01 MED ORDER — CIPROFLOXACIN HCL 500 MG PO TABS: 500.0000 mg | ORAL_TABLET | Freq: Two times a day (BID) | ORAL | Status: DC

## 2013-08-01 NOTE — Telephone Encounter (Signed)
Call in Cipro 500 mg bid for 7 days  

## 2013-08-01 NOTE — Telephone Encounter (Signed)
Pt was seen 12/22 for cpe, but also had UTI .pt given sulfamethoxazole-trimethoprim (BACTRIM DS) 800-160 MG per tablet But states she still isn't feeling better. Pt is not nauseated anymore, but has no appetitive and feels like she should be over this by now.  Pt asked should she give this more time? Np appts today.pt refused another location.  pls [facility].. [facility] [address]

## 2013-08-01 NOTE — Telephone Encounter (Signed)
Please advise 

## 2013-08-01 NOTE — Telephone Encounter (Signed)
Rx sent. Attempted to call pt. Phone line continuously busy

## 2013-08-09 ENCOUNTER — Other Ambulatory Visit: Payer: Self-pay | Admitting: Family Medicine

## 2013-10-01 ENCOUNTER — Other Ambulatory Visit: Payer: Self-pay | Admitting: Family Medicine

## 2013-10-04 ENCOUNTER — Inpatient Hospital Stay (HOSPITAL_COMMUNITY)
Admission: EM | Admit: 2013-10-04 | Discharge: 2013-10-06 | DRG: 063 | Disposition: A | Discharge status: Home / Self Care | Payer: Medicare Other | Attending: Neurology | Admitting: Neurology

## 2013-10-04 ENCOUNTER — Emergency Department (HOSPITAL_COMMUNITY): Payer: Medicare Other

## 2013-10-04 ENCOUNTER — Encounter (HOSPITAL_COMMUNITY): Payer: Self-pay | Admitting: Emergency Medicine

## 2013-10-04 ENCOUNTER — Inpatient Hospital Stay (HOSPITAL_COMMUNITY): Payer: Medicare Other

## 2013-10-04 DIAGNOSIS — R4789 Other speech disturbances: Secondary | ICD-10-CM | POA: Diagnosis present

## 2013-10-04 DIAGNOSIS — Z833 Family history of diabetes mellitus: Secondary | ICD-10-CM

## 2013-10-04 DIAGNOSIS — E785 Hyperlipidemia, unspecified: Secondary | ICD-10-CM | POA: Diagnosis present

## 2013-10-04 DIAGNOSIS — I6350 Cerebral infarction due to unspecified occlusion or stenosis of unspecified cerebral artery: Principal | ICD-10-CM | POA: Diagnosis present

## 2013-10-04 DIAGNOSIS — R29810 Facial weakness: Secondary | ICD-10-CM | POA: Diagnosis present

## 2013-10-04 DIAGNOSIS — Z823 Family history of stroke: Secondary | ICD-10-CM

## 2013-10-04 DIAGNOSIS — E538 Deficiency of other specified B group vitamins: Secondary | ICD-10-CM | POA: Diagnosis present

## 2013-10-04 DIAGNOSIS — I10 Essential (primary) hypertension: Secondary | ICD-10-CM

## 2013-10-04 DIAGNOSIS — I639 Cerebral infarction, unspecified: Secondary | ICD-10-CM

## 2013-10-04 DIAGNOSIS — I359 Nonrheumatic aortic valve disorder, unspecified: Secondary | ICD-10-CM

## 2013-10-04 DIAGNOSIS — Z803 Family history of malignant neoplasm of breast: Secondary | ICD-10-CM

## 2013-10-04 DIAGNOSIS — K219 Gastro-esophageal reflux disease without esophagitis: Secondary | ICD-10-CM | POA: Diagnosis present

## 2013-10-04 LAB — DIFFERENTIAL
Basophils Absolute: 0.1 10*3/uL (ref 0.0–0.1)
Basophils Relative: 1 % (ref 0–1)
Eosinophils Absolute: 0.6 10*3/uL (ref 0.0–0.7)
Eosinophils Relative: 8 % — ABNORMAL HIGH (ref 0–5)
Lymphocytes Relative: 25 % (ref 12–46)
Lymphs Abs: 1.7 10*3/uL (ref 0.7–4.0)
Monocytes Absolute: 0.7 10*3/uL (ref 0.1–1.0)
Monocytes Relative: 10 % (ref 3–12)
Neutro Abs: 3.7 10*3/uL (ref 1.7–7.7)
Neutrophils Relative %: 55 % (ref 43–77)

## 2013-10-04 LAB — URINALYSIS, ROUTINE W REFLEX MICROSCOPIC
Bilirubin Urine: NEGATIVE
Glucose, UA: NEGATIVE mg/dL
Hgb urine dipstick: NEGATIVE
Ketones, ur: NEGATIVE mg/dL
Nitrite: NEGATIVE
Protein, ur: NEGATIVE mg/dL
Specific Gravity, Urine: 1.01 (ref 1.005–1.030)
Urobilinogen, UA: 0.2 mg/dL (ref 0.0–1.0)
pH: 6.5 (ref 5.0–8.0)

## 2013-10-04 LAB — COMPREHENSIVE METABOLIC PANEL
ALT: 16 U/L (ref 0–35)
AST: 20 U/L (ref 0–37)
Albumin: 3.6 g/dL (ref 3.5–5.2)
Alkaline Phosphatase: 95 U/L (ref 39–117)
BUN: 18 mg/dL (ref 6–23)
CO2: 23 mEq/L (ref 19–32)
Calcium: 9.3 mg/dL (ref 8.4–10.5)
Chloride: 98 mEq/L (ref 96–112)
Creatinine, Ser: 0.89 mg/dL (ref 0.50–1.10)
GFR calc Af Amer: 65 mL/min — ABNORMAL LOW (ref >90)
GFR calc non Af Amer: 56 mL/min — ABNORMAL LOW (ref >90)
Glucose, Bld: 125 mg/dL — ABNORMAL HIGH (ref 70–99)
Potassium: 3.5 mEq/L — ABNORMAL LOW (ref 3.7–5.3)
Sodium: 138 mEq/L (ref 137–147)
Total Bilirubin: 0.7 mg/dL (ref 0.3–1.2)
Total Protein: 7.1 g/dL (ref 6.0–8.3)

## 2013-10-04 LAB — CBG MONITORING, ED: Glucose-Capillary: 119 mg/dL — ABNORMAL HIGH (ref 70–99)

## 2013-10-04 LAB — RAPID URINE DRUG SCREEN, HOSP PERFORMED
Amphetamines: NOT DETECTED
Barbiturates: NOT DETECTED
Benzodiazepines: NOT DETECTED
Cocaine: NOT DETECTED
Opiates: NOT DETECTED
Tetrahydrocannabinol: NOT DETECTED

## 2013-10-04 LAB — CBC
HCT: 37.1 % (ref 36.0–46.0)
Hemoglobin: 12.8 g/dL (ref 12.0–15.0)
MCH: 31.1 pg (ref 26.0–34.0)
MCHC: 34.5 g/dL (ref 30.0–36.0)
MCV: 90 fL (ref 78.0–100.0)
Platelets: 210 10*3/uL (ref 150–400)
RBC: 4.12 MIL/uL (ref 3.87–5.11)
RDW: 13 % (ref 11.5–15.5)
WBC: 6.7 10*3/uL (ref 4.0–10.5)

## 2013-10-04 LAB — URINE MICROSCOPIC-ADD ON

## 2013-10-04 LAB — I-STAT CHEM 8, ED
BUN: 18 mg/dL (ref 6–23)
Calcium, Ion: 1.09 mmol/L — ABNORMAL LOW (ref 1.13–1.30)
Chloride: 103 mEq/L (ref 96–112)
Creatinine, Ser: 1 mg/dL (ref 0.50–1.10)
Glucose, Bld: 126 mg/dL — ABNORMAL HIGH (ref 70–99)
HCT: 40 % (ref 36.0–46.0)
Hemoglobin: 13.6 g/dL (ref 12.0–15.0)
Potassium: 3.3 mEq/L — ABNORMAL LOW (ref 3.7–5.3)
Sodium: 140 mEq/L (ref 137–147)
TCO2: 25 mmol/L (ref 0–100)

## 2013-10-04 LAB — PROTIME-INR
INR: 1 (ref 0.00–1.49)
Prothrombin Time: 13 seconds (ref 11.6–15.2)

## 2013-10-04 LAB — APTT: aPTT: 29 seconds (ref 24–37)

## 2013-10-04 LAB — MRSA PCR SCREENING: MRSA by PCR: NEGATIVE

## 2013-10-04 LAB — I-STAT TROPONIN, ED: Troponin i, poc: 0 ng/mL (ref 0.00–0.08)

## 2013-10-04 LAB — ETHANOL: Alcohol, Ethyl (B): <11 mg/dL (ref 0–11)

## 2013-10-04 MED ORDER — ACETAMINOPHEN 650 MG RE SUPP: 650.0000 mg | RECTAL | Status: DC | PRN

## 2013-10-04 MED ORDER — LABETALOL HCL 5 MG/ML IV SOLN: 10.0000 mg | INTRAVENOUS | Status: DC | PRN

## 2013-10-04 MED ORDER — ACETAMINOPHEN 325 MG PO TABS: 650.0000 mg | ORAL_TABLET | ORAL | Status: DC | PRN

## 2013-10-04 MED ORDER — ALTEPLASE (STROKE) FULL DOSE INFUSION
59.0000 mg | Freq: Once | INTRAVENOUS | Status: AC
  Administered 2013-10-04: 59 mg via INTRAVENOUS
  Filled 2013-10-04: qty 59

## 2013-10-04 MED ORDER — STROKE: EARLY STAGES OF RECOVERY BOOK
Freq: Once | Status: DC
  Filled 2013-10-04: qty 1

## 2013-10-04 MED ORDER — PANTOPRAZOLE SODIUM 40 MG IV SOLR
40.0000 mg | Freq: Every day | INTRAVENOUS | Status: DC
  Administered 2013-10-04 – 2013-10-05 (×2): 40 mg via INTRAVENOUS
  Filled 2013-10-04 (×3): qty 40

## 2013-10-04 MED ORDER — SODIUM CHLORIDE 0.9 % IV SOLN
INTRAVENOUS | Status: DC
  Administered 2013-10-04: 22:00:00 via INTRAVENOUS

## 2013-10-04 NOTE — ED Notes (Signed)
MD at bedside. 

## 2013-10-04 NOTE — Evaluation (Signed)
Clinical/Bedside Swallow Evaluation Patient Details  Name: Christine Middleton MRN: [id number] Date of Birth: August 04, 1922  Today's Date: 10/04/2013 Time: 1440-1450 SLP Time Calculation (min): 10 min  Past Medical History:  Past Medical History  Diagnosis Date  . Hyperlipidemia   . GERD (gastroesophageal reflux disease)   . Hypertension   . Shingles   . Diverticulitis   . Esophageal stricture   . Vitamin B 12 deficiency   . Stroke    Past Surgical History:  Past Surgical History  Procedure Laterality Date  . Appendectomy    . Abdominal hysterectomy    . Eye surgery    . Tonsillectomy    . Colonoscopy  12-09-08    per Dr. Sharlett Iles (incomplete along with barium enema 12-10-08) with hemorrhoids  and diverticulae  . Esophagogastroduodenoscopy  09-2004    per Dr. Sharlett Iles with dilatation    HPI:  Christine Middleton is an 80 y.o. female no history of hypertension, hyperlipidemia, previous stroke and vitamin B12 deficiency, presenting with acute onset of left facial weakness and slurred speech as well as gaze deviation to the right side at 7:30 AM today. No extremity weakness was noted. This has been on Plavix 75 mg per day. 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 NIH stroke score was 7. Patient was deemed a candidate for intravenous thrombolytic therapy with TPA which was administered.   Assessment / Plan / Recommendation Clinical Impression  Pt tolerated thin liquids and solids without incident. No evidence of aspiration. Oral weakness appears improved in comparison to initial assessment. Recommend regular diet and thin liquids. No f/u for swallowing. SLP to f/u for SLE 3/9.     Aspiration Risk  Mild    Diet Recommendation Regular;Thin liquid   Liquid Administration via: Cup;Straw Medication Administration: Whole meds with liquid Supervision: Patient able to self feed Postural Changes and/or Swallow Maneuvers: Seated upright 90 degrees    Other   Recommendations Oral Care Recommendations: Oral care BID   Follow Up Recommendations  None    Frequency and Duration        Pertinent Vitals/Pain NA    SLP Swallow Goals     Swallow Study Prior Functional Status       General HPI: Christine Middleton is an 80 y.o. female no history of hypertension, hyperlipidemia, previous stroke and vitamin B12 deficiency, presenting with acute onset of left facial weakness and slurred speech as well as gaze deviation to the right side at 7:30 AM today. No extremity weakness was noted. This has been on Plavix 75 mg per day. 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 NIH stroke score was 7. Patient was deemed a candidate for intravenous thrombolytic therapy with TPA which was administered. Type of Study: Bedside swallow evaluation Previous Swallow Assessment: endoscopy 11/30/2008 - distal stricture with dilatation, Barrett's Esophagus Diet Prior to this Study: NPO Temperature Spikes Noted: No Respiratory Status: Room air History of Recent Intubation: No Behavior/Cognition: Alert;Cooperative;Pleasant mood Oral Cavity - Dentition: Adequate natural dentition Self-Feeding Abilities: Able to feed self Patient Positioning: Upright in bed Baseline Vocal Quality: Clear Volitional Cough: Strong Volitional Swallow: Able to elicit    Oral/Motor/Sensory Function Overall Oral Motor/Sensory Function: Appears within functional limits for tasks assessed   Ice Chips     Thin Liquid Thin Liquid: Within functional limits Presentation: Cup;Straw;Self Fed    Nectar Thick Nectar Thick Liquid: Not tested   Honey Thick Honey  Thick Liquid: Not tested   Puree Puree: Not tested   Solid   GO    Solid: Within functional limits      [facility], [place] CCC-SLP [phone]  Lynann Beaver 10/04/2013,3:00 PM

## 2013-10-04 NOTE — H&P (Signed)
Admission H&P    Chief Complaint: New-onset slurred speech, left facial weakness and right visual gaze.  HPI: Christine Middleton is an 80 y.o. female no history of hypertension, hyperlipidemia, previous stroke and vitamin B12 deficiency, presenting with acute onset of left facial weakness and slurred speech as well as gaze deviation to the right side at 7:30 AM today. No extremity weakness was noted. This has been on Plavix 75 mg per day. 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 NIH stroke score was 7. Patient was deemed a candidate for intravenous thrombolytic therapy with TPA which was administered.  LSN: 7:30 AM on 10/04/2013 tPA Given: Yes mRankin:  Past Medical History  Diagnosis Date  . Hyperlipidemia   . GERD (gastroesophageal reflux disease)   . Hypertension   . Shingles   . Diverticulitis   . Esophageal stricture   . Vitamin B 12 deficiency   . Stroke     Past Surgical History  Procedure Laterality Date  . Appendectomy    . Abdominal hysterectomy    . Eye surgery    . Tonsillectomy    . Colonoscopy  12-09-08    per Dr. Sharlett Iles (incomplete along with barium enema 12-10-08) with hemorrhoids  and diverticulae  . Esophagogastroduodenoscopy  09-2004    per Dr. Sharlett Iles with dilatation     Family History  Problem Relation Age of Onset  . Breast cancer    . Diabetes    . Hypertension    . Stroke    . Heart disease     Social History:  reports that she has never smoked. She has never used smokeless tobacco. She reports that she does not drink alcohol or use illicit drugs.  Allergies:  Allergies  Allergen Reactions  . Amoxicillin     REACTION: rash  . Lisinopril   . Nitrofurantoin     Medications: Patient's medications prior to admission were personally reviewed by me.  ROS: History obtained from child and the patient  General ROS: negative for - chills, fatigue, fever, night sweats, weight gain or weight loss Psychological  ROS: negative for - behavioral disorder, hallucinations, memory difficulties, mood swings or suicidal ideation Ophthalmic ROS: negative for - blurry vision, double vision, eye pain or loss of vision ENT ROS: negative for - epistaxis, nasal discharge, oral lesions, sore throat, tinnitus or vertigo Allergy and Immunology ROS: negative for - hives or itchy/watery eyes Hematological and Lymphatic ROS: negative for - bleeding problems, bruising or swollen lymph nodes Endocrine ROS: negative for - galactorrhea, hair pattern changes, polydipsia/polyuria or temperature intolerance Respiratory ROS: negative for - cough, hemoptysis, shortness of breath or wheezing Cardiovascular ROS: negative for - chest pain, dyspnea on exertion, edema or irregular heartbeat Gastrointestinal ROS: negative for - abdominal pain, diarrhea, hematemesis, nausea/vomiting or stool incontinence Genito-Urinary ROS: negative for - dysuria, hematuria, incontinence or urinary frequency/urgency Musculoskeletal ROS: negative for - joint swelling or muscular weakness Neurological ROS: as noted in HPI Dermatological ROS: negative for rash and skin lesion changes  Physical Examination: Blood pressure 161/65, pulse 68, temperature 98 F (36.7 C), temperature source Oral, resp. rate 18, height '5\' 2"'  (1.575 m), weight 65.772 kg (145 lb), SpO2 97.00%.  HEENT-  Normocephalic, no lesions, without obvious abnormality.  Normal external eye and conjunctiva.  Normal TM's bilaterally.  Normal auditory canals and external ears. Normal external nose, mucus membranes and septum.  Normal pharynx. Neck supple with no masses, nodes, nodules or enlargement. Cardiovascular -  regular rate and rhythm, S1, S2 normal, no murmur, click, rub or gallop Lungs - chest clear, no wheezing, rales, normal symmetric air entry, Heart exam - S1, S2 normal, no murmur, no gallop, rate regular Abdomen - soft, non-tender; bowel sounds normal; no masses,  no  organomegaly Extremities - no joint deformities, effusion, or inflammation and no edema  Neurologic Examination: Mental Status: Alert, oriented, thought content appropriate.  Speech moderately slurred without evidence of aphasia. Able to follow commands without difficulty. Cranial Nerves: II-dense left homonymous hemianopsia. III/IV/VI-Pupils were equal and reacted. Eyes are conjugately deviated to the right side and did not go past midline toward the left side.    V/VII-no facial numbness; moderate left lower facial weakness. VIII-normal. X-moderately slurred speech. Motor: 5/5 bilaterally with normal tone and bulk Sensory: Normal throughout. Deep Tendon Reflexes: 2+ and symmetric. Plantars: Extensor bilaterally Cerebellar: Normal finger-to-nose testing. Carotid auscultation: Normal  Results for orders placed during the hospital encounter of 10/04/13 (from the past 48 hour(s))  CBG MONITORING, ED     Status: Abnormal   Collection Time    10/04/13  8:14 AM      Result Value Ref Range   Glucose-Capillary 119 (*) 70 - 99 mg/dL   Comment 1 Documented in Chart    ETHANOL     Status: None   Collection Time    10/04/13  8:18 AM      Result Value Ref Range   Alcohol, Ethyl (B) <11  0 - 11 mg/dL   Comment:            LOWEST DETECTABLE LIMIT FOR     SERUM ALCOHOL IS 11 mg/dL     FOR MEDICAL PURPOSES ONLY  PROTIME-INR     Status: None   Collection Time    10/04/13  8:18 AM      Result Value Ref Range   Prothrombin Time 13.0  11.6 - 15.2 seconds   INR 1.00  0.00 - 1.49  APTT     Status: None   Collection Time    10/04/13  8:18 AM      Result Value Ref Range   aPTT 29  24 - 37 seconds  CBC     Status: None   Collection Time    10/04/13  8:18 AM      Result Value Ref Range   WBC 6.7  4.0 - 10.5 K/uL   RBC 4.12  3.87 - 5.11 MIL/uL   Hemoglobin 12.8  12.0 - 15.0 g/dL   HCT 37.1  36.0 - 46.0 %   MCV 90.0  78.0 - 100.0 fL   MCH 31.1  26.0 - 34.0 pg   MCHC 34.5  30.0 - 36.0 g/dL    RDW 13.0  11.5 - 15.5 %   Platelets 210  150 - 400 K/uL  DIFFERENTIAL     Status: Abnormal   Collection Time    10/04/13  8:18 AM      Result Value Ref Range   Neutrophils Relative % 55  43 - 77 %   Neutro Abs 3.7  1.7 - 7.7 K/uL   Lymphocytes Relative 25  12 - 46 %   Lymphs Abs 1.7  0.7 - 4.0 K/uL   Monocytes Relative 10  3 - 12 %   Monocytes Absolute 0.7  0.1 - 1.0 K/uL   Eosinophils Relative 8 (*) 0 - 5 %   Eosinophils Absolute 0.6  0.0 - 0.7 K/uL   Basophils Relative 1  0 - 1 %   Basophils Absolute 0.1  0.0 - 0.1 K/uL  COMPREHENSIVE METABOLIC PANEL     Status: Abnormal   Collection Time    10/04/13  8:18 AM      Result Value Ref Range   Sodium 138  137 - 147 mEq/L   Potassium 3.5 (*) 3.7 - 5.3 mEq/L   Chloride 98  96 - 112 mEq/L   CO2 23  19 - 32 mEq/L   Glucose, Bld 125 (*) 70 - 99 mg/dL   BUN 18  6 - 23 mg/dL   Creatinine, Ser 0.89  0.50 - 1.10 mg/dL   Calcium 9.3  8.4 - 10.5 mg/dL   Total Protein 7.1  6.0 - 8.3 g/dL   Albumin 3.6  3.5 - 5.2 g/dL   AST 20  0 - 37 U/L   ALT 16  0 - 35 U/L   Alkaline Phosphatase 95  39 - 117 U/L   Total Bilirubin 0.7  0.3 - 1.2 mg/dL   GFR calc non Af Amer 56 (*) >90 mL/min   GFR calc Af Amer 65 (*) >90 mL/min   Comment: (NOTE)     The eGFR has been calculated using the CKD EPI equation.     This calculation has not been validated in all clinical situations.     eGFR's persistently <90 mL/min signify possible Chronic Kidney     Disease.  Randolm Idol, ED     Status: None   Collection Time    10/04/13  8:23 AM      Result Value Ref Range   Troponin i, poc 0.00  0.00 - 0.08 ng/mL   Comment 3            Comment: Due to the release kinetics of cTnI,     a negative result within the first hours     of the onset of symptoms does not rule out     myocardial infarction with certainty.     If myocardial infarction is still suspected,     repeat the test at appropriate intervals.  I-STAT CHEM 8, ED     Status: Abnormal    Collection Time    10/04/13  8:25 AM      Result Value Ref Range   Sodium 140  137 - 147 mEq/L   Potassium 3.3 (*) 3.7 - 5.3 mEq/L   Chloride 103  96 - 112 mEq/L   BUN 18  6 - 23 mg/dL   Creatinine, Ser 1.00  0.50 - 1.10 mg/dL   Glucose, Bld 126 (*) 70 - 99 mg/dL   Calcium, Ion 1.09 (*) 1.13 - 1.30 mmol/L   TCO2 25  0 - 100 mmol/L   Hemoglobin 13.6  12.0 - 15.0 g/dL   HCT 40.0  36.0 - 46.0 %   Ct Head Wo Contrast  10/04/2013   CLINICAL DATA:  Left-sided facial droop and slurred speech. History of prior cerebral infarction.  EXAM: CT HEAD WITHOUT CONTRAST  TECHNIQUE: Contiguous axial images were obtained from the base of the skull through the vertex without intravenous contrast.  COMPARISON:  MR MRA HEAD W/O CM dated 07/10/2009  FINDINGS: No acute cortical or deep infarct is identified. The brain shows evidence of small vessel disease and atrophy. Old infarct of the right posterior temporo-occipital region is identified. Subtle low density in the deep right cerebellum compared to the left may relate to pre-existing small vessel disease. Subtle infarct may be present. No  hemorrhage is seen. No mass effect or extra-axial fluid. No hydrocephalus. The skull is unremarkable.  IMPRESSION: No definite acute infarct. There is some asymmetric low density in the deeper aspect of the right cerebellum compared to the left. This could represent subtle infarct or asymmetric small vessel disease. Small vessel disease, atrophy and old right posterior temporo-occipital infarct.  These results were called by telephone at the time of interpretation on 10/04/2013 at 8:28 AM to Dr. Nicole Kindred, who verbally acknowledged these results.   Electronically Signed   By: Aletta Edouard M.D.   On: 10/04/2013 08:33    Assessment: 80 y.o. female   Stroke Risk Factors - hyperlipidemia and hypertension  Plan: 1. Post TPA admission and management in neuro intensive care unit 2. MRI, MRA  of the brain without contrast 3. PT consult,  OT consult, Speech consult 4. Echocardiogram 5. Carotid dopplers 6. Prophylactic therapy-Antiplatelet med: Aspirin 325 mg by mouth or 300 mg rectally if CT scan 24 hours after TPA administration shows no acute intracranial hemorrhage. 7. Risk factor modification 8. HgbA1c, fasting lipid panel  Patient's admission workup and management required complex diagnostic studies and clinical management decision-making, including emergency thrombolytic therapy for acute stroke with TPA. Total critical care time was 90 minutes.  C.R. Nicole Kindred, MD [facility] [phone]  10/04/2013, 8:58 AM

## 2013-10-04 NOTE — Progress Notes (Signed)
  Echocardiogram 2D Echocardiogram has been performed.  Mildred Tuccillo FRANCES 10/04/2013, 3:11 PM

## 2013-10-04 NOTE — ED Provider Notes (Signed)
CSN: [id number]     Arrival date & time 10/04/13  0809 History   First MD Initiated Contact with Patient 10/04/13 [phone]     Chief Complaint  Patient presents with  . Code Stroke    An emergency department physician performed an initial assessment on this suspected stroke patient at 65. (Consider location/radiation/quality/duration/timing/severity/associated sxs/prior Treatment) Patient is a 80 y.o. female presenting with Acute Neurological Problem. The history is provided by the patient and the EMS personnel.  Cerebrovascular Accident This is a new problem. The current episode started 1 to 2 hours ago. The problem occurs constantly. The problem has not changed since onset.Pertinent negatives include no chest pain, no abdominal pain, no headaches and no shortness of breath. Nothing aggravates the symptoms. Nothing relieves the symptoms. She has tried nothing for the symptoms. The treatment provided no relief.    Past Medical History  Diagnosis Date  . Hyperlipidemia   . GERD (gastroesophageal reflux disease)   . Hypertension   . Shingles   . Diverticulitis   . Esophageal stricture   . Vitamin B 12 deficiency   . Stroke    Past Surgical History  Procedure Laterality Date  . Appendectomy    . Abdominal hysterectomy    . Eye surgery    . Tonsillectomy    . Colonoscopy  12-09-08    per Dr. Sharlett Iles (incomplete along with barium enema 12-10-08) with hemorrhoids  and diverticulae  . Esophagogastroduodenoscopy  09-2004    per Dr. Sharlett Iles with dilatation    Family History  Problem Relation Age of Onset  . Breast cancer    . Diabetes    . Hypertension    . Stroke    . Heart disease     History  Substance Use Topics  . Smoking status: Never Smoker   . Smokeless tobacco: Never Used  . Alcohol Use: No   OB History   Grav Para Term Preterm Abortions TAB SAB Ect Mult Living                 Review of Systems  Constitutional: Negative for fever and fatigue.  HENT: Negative  for congestion and drooling.   Eyes: Negative for pain.  Respiratory: Negative for cough and shortness of breath.   Cardiovascular: Negative for chest pain.  Gastrointestinal: Negative for nausea, vomiting, abdominal pain and diarrhea.  Genitourinary: Negative for dysuria and hematuria.  Musculoskeletal: Negative for back pain, gait problem and neck pain.  Skin: Negative for color change.  Neurological: Negative for dizziness and headaches.  Hematological: Negative for adenopathy.  Psychiatric/Behavioral: Negative for behavioral problems.  All other systems reviewed and are negative.      Allergies  Amoxicillin; Lisinopril; and Nitrofurantoin  Home Medications   Current Outpatient Rx  Name  Route  Sig  Dispense  Refill  . atenolol (TENORMIN) 100 MG tablet   Oral   Take 1 tablet (100 mg total) by mouth daily.   30 tablet   11   . atenolol (TENORMIN) 100 MG tablet      TAKE 1 TABLET EVERY DAY   30 tablet   11   . ciprofloxacin (CIPRO) 500 MG tablet   Oral   Take 1 tablet (500 mg total) by mouth 2 (two) times daily.   14 tablet   0   . clopidogrel (PLAVIX) 75 MG tablet   Oral   Take 1 tablet (75 mg total) by mouth daily.   30 tablet   11   .  hydrochlorothiazide (HYDRODIURIL) 25 MG tablet   Oral   Take 1 tablet (25 mg total) by mouth daily.   30 tablet   11   . losartan (COZAAR) 100 MG tablet      TAKE 1 TABLET EVERY DAY   90 tablet   1   . pantoprazole (PROTONIX) 40 MG tablet   Oral   Take 1 tablet (40 mg total) by mouth daily.   30 tablet   11   . simvastatin (ZOCOR) 20 MG tablet   Oral   Take 1 tablet (20 mg total) by mouth at bedtime.   30 tablet   11   . sulfamethoxazole-trimethoprim (BACTRIM DS) 800-160 MG per tablet   Oral   Take 1 tablet by mouth 2 (two) times daily.   14 tablet   0    BP 171/73  Pulse 77  Temp(Src) 98 F (36.7 C) (Oral)  Resp 21  Ht 5\' 2"  (1.575 m)  Wt 145 lb (65.772 kg)  BMI 26.51 kg/m2  SpO2 96% Physical  Exam  Nursing note and vitals reviewed. Constitutional: She is oriented to person, place, and time. She appears well-developed and well-nourished.  HENT:  Head: Normocephalic.  Mouth/Throat: Oropharynx is clear and moist. No oropharyngeal exudate.  Eyes: Conjunctivae and EOM are normal. Pupils are equal, round, and reactive to light.  Neck: Normal range of motion. Neck supple.  Cardiovascular: Normal rate, regular rhythm, normal heart sounds and intact distal pulses.  Exam reveals no gallop and no friction rub.   No murmur heard. Pulmonary/Chest: Effort normal and breath sounds normal. No respiratory distress. She has no wheezes.  Abdominal: Soft. Bowel sounds are normal. There is no tenderness. There is no rebound and no guarding.  Musculoskeletal: Normal range of motion. She exhibits no edema and no tenderness.  Neurological: She is alert and oriented to person, place, and time. She has normal strength. No sensory deficit.  Right sided preferential gaze.   Slurred speech.   Left sided facial droop.   Skin: Skin is warm and dry.  Psychiatric: She has a normal mood and affect. Her behavior is normal.    ED Course  Procedures (including critical care time) Labs Review Labs Reviewed  DIFFERENTIAL - Abnormal; Notable for the following:    Eosinophils Relative 8 (*)    All other components within normal limits  COMPREHENSIVE METABOLIC PANEL - Abnormal; Notable for the following:    Potassium 3.5 (*)    Glucose, Bld 125 (*)    GFR calc non Af Amer 56 (*)    GFR calc Af Amer 65 (*)    All other components within normal limits  I-STAT CHEM 8, ED - Abnormal; Notable for the following:    Potassium 3.3 (*)    Glucose, Bld 126 (*)    Calcium, Ion 1.09 (*)    All other components within normal limits  CBG MONITORING, ED - Abnormal; Notable for the following:    Glucose-Capillary 119 (*)    All other components within normal limits  MRSA PCR SCREENING  ETHANOL  PROTIME-INR  APTT   CBC  URINE RAPID DRUG SCREEN (HOSP PERFORMED)  URINALYSIS, ROUTINE W REFLEX MICROSCOPIC  I-STAT TROPOININ, ED  I-STAT TROPOININ, ED   Imaging Review Ct Head Wo Contrast  10/04/2013   CLINICAL DATA:  Left-sided facial droop and slurred speech. History of prior cerebral infarction.  EXAM: CT HEAD WITHOUT CONTRAST  TECHNIQUE: Contiguous axial images were obtained from the base of the skull  through the vertex without intravenous contrast.  COMPARISON:  MR MRA HEAD W/O CM dated 07/10/2009  FINDINGS: No acute cortical or deep infarct is identified. The brain shows evidence of small vessel disease and atrophy. Old infarct of the right posterior temporo-occipital region is identified. Subtle low density in the deep right cerebellum compared to the left may relate to pre-existing small vessel disease. Subtle infarct may be present. No hemorrhage is seen. No mass effect or extra-axial fluid. No hydrocephalus. The skull is unremarkable.  IMPRESSION: No definite acute infarct. There is some asymmetric low density in the deeper aspect of the right cerebellum compared to the left. This could represent subtle infarct or asymmetric small vessel disease. Small vessel disease, atrophy and old right posterior temporo-occipital infarct.  These results were called by telephone at the time of interpretation on 10/04/2013 at 8:28 AM to Dr. Nicole Kindred, who verbally acknowledged these results.   Electronically Signed   By: Aletta Edouard M.D.   On: 10/04/2013 08:33     EKG Interpretation   Date/Time:  Saturday October 04 2013 08:33:18 EST Ventricular Rate:  72 PR Interval:  190 QRS Duration: 99 QT Interval:  415 QTC Calculation: 454 R Axis:   -36 Text Interpretation:  Sinus rhythm Left axis deviation Confirmed by  Danialle Dement  MD, Manoj Enriquez ([id number]) on 10/04/2013 8:44:00 AM     CRITICAL CARE Performed by: Pamella Pert, S Total critical care time: 30 min Critical care time was exclusive of separately billable procedures  and treating other patients. Critical care was necessary to treat or prevent imminent or life-threatening deterioration. Critical care was time spent personally by me on the following activities: development of treatment plan with patient and/or surrogate as well as nursing, discussions with consultants, evaluation of patient's response to treatment, examination of patient, obtaining history from patient or surrogate, ordering and performing treatments and interventions, ordering and review of laboratory studies, ordering and review of radiographic studies, pulse oximetry and re-evaluation of patient's condition.  MDM   Final diagnoses:  CVA (cerebral infarction)    8:49 AM 80 y.o. female who presents with sudden onset left-sided facial droop and slurred speech which began this morning at approximately 7:30 AM while she was eating breakfast. She was noted by EMS to have a right-sided preferential gaze. She is afebrile and mildly hypertensive here on exam. Stroke called by EMS. The patient was seen and evaluated by Dr. Nicole Kindred. It was decided that TPA should be started and will begin in the emergency department under close supervision.   The pt will be admitted to the Neuro ICU. She required multiple rechecks and evaluations while getting tpa in the ED.     Blanchard Kelch, MD 10/04/13 1215

## 2013-10-04 NOTE — ED Notes (Signed)
Son reported to EMS staff PT was last seen normal at 0730 today. Pt was eating and speech became slurred and food fell out of her mouth.  On arrival to Ed PT A/O  And interactive .

## 2013-10-04 NOTE — Code Documentation (Signed)
Code stroke called at [place], patient arrived to [facility] ED via [place] at [place].  As per patient she woke up this morning feeling OK and 0730 she was eating breakfast when son noted she had food falling out of her mouth, and slurred speech, and gaze.  NIHSS 7, Pharmacy notified to mix TPA at [phone], delivered to bedside at [place], TPA started at [phone].  NICU bed requested at 0855.

## 2013-10-05 ENCOUNTER — Inpatient Hospital Stay (HOSPITAL_COMMUNITY): Payer: Medicare Other

## 2013-10-05 LAB — LIPID PANEL
Cholesterol: 85 mg/dL (ref 0–200)
HDL: 34 mg/dL — ABNORMAL LOW (ref >39)
LDL Cholesterol: 34 mg/dL (ref 0–99)
Total CHOL/HDL Ratio: 2.5 RATIO
Triglycerides: 83 mg/dL (ref <150)
VLDL: 17 mg/dL (ref 0–40)

## 2013-10-05 LAB — HEMOGLOBIN A1C
Hgb A1c MFr Bld: 5.4 % (ref <5.7)
Mean Plasma Glucose: 108 mg/dL (ref <117)

## 2013-10-05 MED ORDER — ENOXAPARIN SODIUM 40 MG/0.4ML ~~LOC~~ SOLN
40.0000 mg | SUBCUTANEOUS | Status: DC
  Administered 2013-10-05 – 2013-10-06 (×2): 40 mg via SUBCUTANEOUS
  Filled 2013-10-05 (×2): qty 0.4

## 2013-10-05 MED ORDER — POTASSIUM CHLORIDE 20 MEQ PO PACK
20.0000 meq | PACK | Freq: Two times a day (BID) | ORAL | Status: DC
  Filled 2013-10-05 (×2): qty 1

## 2013-10-05 MED ORDER — CLOPIDOGREL BISULFATE 75 MG PO TABS
75.0000 mg | ORAL_TABLET | Freq: Every day | ORAL | Status: DC
  Administered 2013-10-06: 75 mg via ORAL
  Filled 2013-10-05 (×2): qty 1

## 2013-10-05 MED ORDER — SIMVASTATIN 20 MG PO TABS
20.0000 mg | ORAL_TABLET | Freq: Every day | ORAL | Status: DC
  Administered 2013-10-05: 20 mg via ORAL
  Filled 2013-10-05 (×2): qty 1

## 2013-10-05 MED ORDER — POTASSIUM CHLORIDE CRYS ER 20 MEQ PO TBCR
20.0000 meq | EXTENDED_RELEASE_TABLET | Freq: Two times a day (BID) | ORAL | Status: DC
  Administered 2013-10-05 – 2013-10-06 (×3): 20 meq via ORAL
  Filled 2013-10-05 (×4): qty 1

## 2013-10-05 MED ORDER — ASPIRIN EC 325 MG PO TBEC
325.0000 mg | DELAYED_RELEASE_TABLET | Freq: Every day | ORAL | Status: DC
  Administered 2013-10-05: 325 mg via ORAL
  Filled 2013-10-05: qty 1

## 2013-10-05 NOTE — Progress Notes (Signed)
VASCULAR LAB PRELIMINARY  PRELIMINARY  PRELIMINARY  PRELIMINARY  Carotid Dopplers completed.    Preliminary report:  1-39% ICA stenosis.  Vertebral artery flow is antegrade.  Shenandoah Yeats, RVT 10/05/2013, 11:36 AM

## 2013-10-05 NOTE — Progress Notes (Signed)
Stroke Team Progress Note  HISTORY 80 y.o. female no history of hypertension, hyperlipidemia, previous stroke and vitamin B12 deficiency, presenting with acute onset of left facial weakness and slurred speech as well as gaze deviation to the right side at 7:30 AM today. No extremity weakness was noted. This has been on Plavix 75 mg per day. 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 NIH stroke score was 7. Patient was deemed a candidate for intravenous thrombolytic therapy with TPA which was administered.   SUBJECTIVE Significant improvement in exam. NIHSS is 0 this AM. S/p MRI  OBJECTIVE Most recent Vital Signs: Filed Vitals:   10/05/13 0600 10/05/13 0700 10/05/13 0757 10/05/13 0800  BP: 146/59 168/61  130/39  Pulse: 68 72  76  Temp:   97.4 F (36.3 C)   TempSrc:   Oral   Resp: 15 17  19   Height:      Weight:      SpO2: 98% 96%  98%   CBG (last 3)   Recent Labs  10/04/13 0814  GLUCAP 119*    IV Fluid Intake:   . sodium chloride 75 mL/hr at 10/05/13 0700    MEDICATIONS  .  stroke: mapping our early stages of recovery book   Does not apply Once  . pantoprazole (PROTONIX) IV  40 mg Intravenous QHS   PRN:  acetaminophen, acetaminophen, labetalol  Diet:  General Activity:  Up with assistance DVT Prophylaxis:  lovenox/scds  CLINICALLY SIGNIFICANT STUDIES Basic Metabolic Panel:  Recent Labs Lab 10/04/13 0818 10/04/13 0825  NA 138 140  K 3.5* 3.3*  CL 98 103  CO2 23  --   GLUCOSE 125* 126*  BUN 18 18  CREATININE 0.89 1.00  CALCIUM 9.3  --    Liver Function Tests:  Recent Labs Lab 10/04/13 0818  AST 20  ALT 16  ALKPHOS 95  BILITOT 0.7  PROT 7.1  ALBUMIN 3.6   CBC:  Recent Labs Lab 10/04/13 0818 10/04/13 0825  WBC 6.7  --   NEUTROABS 3.7  --   HGB 12.8 13.6  HCT 37.1 40.0  MCV 90.0  --   PLT 210  --    Coagulation:  Recent Labs Lab 10/04/13 0818  LABPROT 13.0  INR 1.00   Cardiac Enzymes: No results  found for this basename: CKTOTAL, CKMB, CKMBINDEX, TROPONINI,  in the last 168 hours Urinalysis:  Recent Labs Lab 10/04/13 2030  [place] 1.010  PHURINE 6.5  GLUCOSEU NEGATIVE  HGBUR NEGATIVE  BILIRUBINUR NEGATIVE  KETONESUR NEGATIVE  PROTEINUR NEGATIVE  UROBILINOGEN 0.2  NITRITE NEGATIVE  LEUKOCYTESUR MODERATE*   Lipid Panel    Component Value Date/Time   CHOL 85 10/05/2013 0330   TRIG 83 10/05/2013 0330   HDL 34* 10/05/2013 0330   CHOLHDL 2.5 10/05/2013 0330   VLDL 17 10/05/2013 0330   LDLCALC 34 10/05/2013 0330   HgbA1C  Lab Results  Component Value Date   HGBA1C 6.1 07/18/2012    Urine Drug Screen:     Component Value Date/Time   LABOPIA NONE DETECTED 10/04/2013 2030   COCAINSCRNUR NONE DETECTED 10/04/2013 2030   LABBENZ NONE DETECTED 10/04/2013 2030   AMPHETMU NONE DETECTED 10/04/2013 2030   THCU NONE DETECTED 10/04/2013 2030   LABBARB NONE DETECTED 10/04/2013 2030    Alcohol Level:  Recent Labs Lab 10/04/13 0818  ETH <11    Ct Head Wo Contrast  10/04/2013   CLINICAL DATA:  Left-sided  facial droop and slurred speech. History of prior cerebral infarction.  EXAM: CT HEAD WITHOUT CONTRAST  TECHNIQUE: Contiguous axial images were obtained from the base of the skull through the vertex without intravenous contrast.  COMPARISON:  MR MRA HEAD W/O CM dated 07/10/2009  FINDINGS: No acute cortical or deep infarct is identified. The brain shows evidence of small vessel disease and atrophy. Old infarct of the right posterior temporo-occipital region is identified. Subtle low density in the deep right cerebellum compared to the left may relate to pre-existing small vessel disease. Subtle infarct may be present. No hemorrhage is seen. No mass effect or extra-axial fluid. No hydrocephalus. The skull is unremarkable.  IMPRESSION: No definite acute infarct. There is some asymmetric low density in the deeper aspect of the right cerebellum compared to the left. This could represent subtle  infarct or asymmetric small vessel disease. Small vessel disease, atrophy and old right posterior temporo-occipital infarct.  These results were called by telephone at the time of interpretation on 10/04/2013 at 8:28 AM to Dr. Nicole Kindred, who verbally acknowledged these results.   Electronically Signed   By: Aletta Edouard M.D.   On: 10/04/2013 08:33   Dg Chest Port 1 View  10/04/2013   CLINICAL DATA:  Possible stroke.  Hypertension.  EXAM: PORTABLE CHEST - 1 VIEW  COMPARISON:  No priors.  FINDINGS: Lung volumes are normal. No consolidative airspace disease. No pleural effusions. No evidence of pulmonary edema. Heart size is normal. The patient is rotated to the right on today's exam, resulting in distortion of the mediastinal contours and reduced diagnostic sensitivity and specificity for mediastinal pathology. Atherosclerosis in the thoracic aorta.  IMPRESSION: 1. No radiographic evidence of acute cardiopulmonary disease. 2. Atherosclerosis.   Electronically Signed   By: Vinnie Langton M.D.   On: 10/04/2013 19:41    CT of the brain  No definite acute infarct   MRI of the brain  Prelim: small infarct in R temp/frontal lobe   MRA of the brain  Prelim: R vert dominant no sig. abrnormalities  2D Echocardiogram    Carotid Doppler    CXR    EKG  normal EKG, normal sinus rhythm, unchanged from previous tracings. For complete results please see formal report.   Therapy Recommendations pending  Physical Exam  Neurologic Examination:  Mental Status:  Alert, oriented, thought content appropriate. Speech at baseline. Able to follow commands without difficulty.  Cranial Nerves:  II-no visual deficits III/IV/VI-Pupils were equal and reacted. Eyes are conjugately deviated to the right side and did not go past midline toward the left side.  V/VII-no facial numbness; moderate left lower facial weakness.  VIII-normal.  Motor: 5/5 bilaterally with normal tone and bulk  Sensory: Normal throughout.  Deep  Tendon Reflexes: 2+ and symmetric.  Plantars: Extensor bilaterally  Cerebellar: Normal finger-to-nose testing.  Carotid auscultation: Normal   ASSESSMENT [name] is a 80 y.o. female no history of hypertension, hyperlipidemia, previous stroke and vitamin B12 deficiency, presenting with acute onset of left facial weakness and slurred speech as well as gaze deviation to the right side at 7:30 AM yesterday. S/p TPA which was finished by 10AM. Pt is currently back to baseline, NIHSS 0.     HTN  LDL 34    Hospital day # 1  TREATMENT/PLAN  Significant improvement-transfer to floor today as NIHSS is 0  Add plavix  Add lovenonox  2decho/carotid  Pt/ot  Supplement potassium  10/05/2013 9:26 AM Gavin Telford, Alease Frame  I have  personally obtained a history, examined the patient, evaluated imaging results, and formulated the assessment and plan of care. I agree with the above.

## 2013-10-05 NOTE — Evaluation (Signed)
Physical Therapy Evaluation Patient Details Name: Christine Middleton MRN: [id number] DOB: [birth year] Today's Date: 10/05/2013 Time: 7425-9563 PT Time Calculation (min): 20 min  PT Assessment / Plan / Recommendation History of Present Illness    80 y.o. female no history of hypertension, hyperlipidemia, previous stroke and vitamin B12 deficiency, presenting with acute onset of left facial weakness and slurred speech as well as gaze deviation to the right side at 7:30 AM today. No extremity weakness was noted. This has been on Plavix 75 mg per day. 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 MRI revealed small infarct in Rt temp/frontal lobe.    Clinical Impression  Pt adm due to the above. Pt with no focal deficits in strength noticed during evaluation. Pt limited in independence with functional mobility at this time. Pt to benefit from skilled acute PT to address deficits listed below (see PT problem list) and to maximize mobility prior to D/C home. Pt reports she can have 24/7 (A) if needed upon D/C. Anticipate good progress; pt very motivated.     PT Assessment  Patient needs continued PT services    Follow Up Recommendations  No PT follow up;Supervision/Assistance - 24 hour    Does the patient have the potential to tolerate intense rehabilitation      Barriers to Discharge Decreased caregiver support lives alone; cares for her husband who is in the hospital    Equipment Recommendations  None recommended by PT    Recommendations for Other Services OT consult   Frequency Min 4X/week    Precautions / Restrictions Precautions Precautions: Fall   Pertinent Vitals/Pain Stable t/o session      Mobility  Bed Mobility Overal bed mobility: Modified Independent General bed mobility comments: relied on handrails; effortful but no physical (A) needed Transfers Overall transfer level: Needs assistance Equipment used: 1 person hand held assist Transfers:  Sit to/from Stand Sit to Stand: Min guard General transfer comment: pt slightly unsteady and LEs shaky with inital stand; min guard to steady; cues for sequencing  Ambulation/Gait Ambulation/Gait assistance: Min guard Ambulation Distance (Feet): 200 Feet Assistive device: 1 person hand held assist Gait Pattern/deviations: Decreased stride length;Narrow base of support;Shuffle Gait velocity: decreased; cautious  Gait velocity interpretation: Below normal speed for age/gender General Gait Details: min guard to steady and handheld (A) at times to balance; LEs shaky and pt nervous; cues for safety and sequencing; No LOB noted Modified Rankin (Stroke Patients Only) Pre-Morbid Rankin Score: No symptoms Modified Rankin: Moderately severe disability         PT Diagnosis: Difficulty walking  PT Problem List: Decreased activity tolerance;Decreased balance;Decreased mobility PT Treatment Interventions: DME instruction;Gait training;Functional mobility training;Therapeutic activities;Therapeutic exercise;Balance training;Patient/family education     PT Goals(Current goals can be found in the care plan section) Acute Rehab PT Goals Patient Stated Goal: to get better so i can see my husband PT Goal Formulation: With patient Time For Goal Achievement: 10/19/13 Potential to Achieve Goals: Good  Visit Information  Last PT Received On: 10/05/13 Assistance Needed: +1       Prior [place] expects to be discharged to:: Private residence Living Arrangements: Spouse/significant other Available Help at Discharge: Family;Available 24 hours/day Type of Home: House Home Access: Ramped entrance Home Layout: One level Home Equipment: Walker - 2 wheels;Cane - single point;Bedside commode;Shower seat Prior Function Level of Independence: Independent Comments: pt drives and is caregiver for her husband; her husband is in the  hospital at this time   Communication Communication: No difficulties    Cognition  Cognition Arousal/Alertness: Awake/alert Behavior During Therapy: WFL for tasks assessed/performed Overall Cognitive Status: Within Functional Limits for tasks assessed    Extremity/Trunk Assessment Upper Extremity Assessment Upper Extremity Assessment: Defer to OT evaluation Lower Extremity Assessment Lower Extremity Assessment: Overall WFL for tasks assessed Cervical / Trunk Assessment Cervical / Trunk Assessment: Normal   Balance Balance Overall balance assessment: Needs assistance Sitting-balance support: Feet supported;Single extremity supported Sitting balance-Leahy Scale: Fair Sitting balance - Comments: tolerated sitting EOB ~5 min; no c/o dizziness Standing balance support: During functional activity;Single extremity supported Standing balance-Leahy Scale: Poor Standing balance comment: UE supported; slightly unsteady and bracing; no LOB noted  End of Session PT - End of Session Equipment Utilized During Treatment: Gait belt Activity Tolerance: Patient tolerated treatment well Patient left: in chair;with call bell/phone within reach Nurse Communication: Mobility status  GP     Gustavus Bryant, [place] 10/05/2013, 2:22 PM

## 2013-10-06 MED ORDER — PANTOPRAZOLE SODIUM 40 MG PO TBEC
40.0000 mg | DELAYED_RELEASE_TABLET | Freq: Every day | ORAL | Status: DC
  Administered 2013-10-06: 40 mg via ORAL
  Filled 2013-10-06: qty 1

## 2013-10-06 NOTE — Evaluation (Signed)
Occupational Therapy Evaluation and Discharge Summary Patient Details Name: Christine Middleton MRN: [id number] DOB: 1922/11/06 Today's Date: 10/06/2013 Time: 1020-1040 OT Time Calculation (min): 20 min  OT Assessment / Plan / Recommendation History of present illness Pt is an 80 yo wf admitted for facial droop and slurred speech.  MRI showed small temporal CVA.  Pt with no strength deficits and seems to have returned to baseline or close to baseline.   Clinical Impression   Pt seen for above diagnosis and has seemed to returned to at or close to baseline.  Pt has no acute OT needs at this time.     OT Assessment  Patient does not need any further OT services    Follow Up Recommendations  No OT follow up    Barriers to Discharge      Equipment Recommendations  3 in 1 bedside comode    Recommendations for Other Services    Frequency       Precautions / Restrictions Precautions Precautions: Fall Precaution Comments: Pt more steady today. Restrictions Weight Bearing Restrictions: No   Pertinent Vitals/Pain Vitals stable during OT session. No reports of pain.    ADL  Eating/Feeding: Performed;Independent Where Assessed - Eating/Feeding: Chair Grooming: Performed;Wash/dry hands;Supervision/safety Where Assessed - Grooming: Unsupported standing Upper Body Bathing: Performed;Set up Where Assessed - Upper Body Bathing: Unsupported sitting Lower Body Bathing: Performed;Supervision/safety Where Assessed - Lower Body Bathing: Unsupported sit to stand Upper Body Dressing: Performed;Set up Where Assessed - Upper Body Dressing: Unsupported sitting Lower Body Dressing: Performed;Supervision/safety Where Assessed - Lower Body Dressing: Unsupported sit to stand Toilet Transfer: Performed;Supervision/safety Toilet Transfer Method: Sit to Loss adjuster, chartered: Comfort height toilet;Grab bars Toileting - Clothing Manipulation and Hygiene: Performed;Supervision/safety Where Assessed  - Best boy and Hygiene: Standing Transfers/Ambulation Related to ADLs: Pt walked in room and in hallway iwth S. ADL Comments: Pt overall very I with adls and was steady in room during all adls.    OT Diagnosis:    OT Problem List:   OT Treatment Interventions:     OT Goals(Current goals can be found in the care plan section) Acute Rehab OT Goals Patient Stated Goal: to get better so i can see my husband  Visit Information  Last OT Received On: 10/06/13 Assistance Needed: +1 History of Present Illness: Pt is an 80 yo wf admitted for facial droop and slurred speech.  MRI showed small temporal CVA.  Pt with no strength deficits and seems to have returned to baseline or close to baseline.       Prior [place] expects to be discharged to:: Private residence Living Arrangements: Spouse/significant other Available Help at Discharge: Family;Available 24 hours/day Type of Home: House Home Access: Ramped entrance Home Layout: Two level Alternate Level Stairs-Number of Steps: 10 Alternate Level Stairs-Rails: Right Home Equipment: Walker - 2 wheels;Cane - single point;Toilet riser (needs bsc only has riser) Additional Comments: Pt needs 3:1.  Pt has a riser but no BSC Prior Function Level of Independence: Independent Comments: pt drives and is caregiver for her husband; her husband is in the hospital at this time  Communication Communication: No difficulties Dominant Hand: Right         Vision/Perception Vision - History Baseline Vision: Wears glasses all the time Visual History: Cataracts Patient Visual Report: No change from baseline Vision - Assessment Eye Alignment: Within Functional Limits Vision Assessment: Vision tested Ocular Range of Motion: Within Functional Limits Alignment/Gaze Preference: Within  Defined Limits Tracking/Visual Pursuits: Able to track stimulus in all quads without difficulty Visual Fields:  No apparent deficits Perception Perception: Within Functional Limits Praxis Praxis: Intact   Cognition  Cognition Arousal/Alertness: Awake/alert Behavior During Therapy: WFL for tasks assessed/performed Overall Cognitive Status: Within Functional Limits for tasks assessed    Extremity/Trunk Assessment Upper Extremity Assessment Upper Extremity Assessment: Overall WFL for tasks assessed Lower Extremity Assessment Lower Extremity Assessment: Defer to PT evaluation Cervical / Trunk Assessment Cervical / Trunk Assessment: Normal     Mobility Bed Mobility Overal bed mobility: Modified Independent General bed mobility comments: relied on handrails; effortful but no physical (A) needed Transfers Overall transfer level: Needs assistance Equipment used: None Transfers: Sit to/from [facility] Sit to Stand: Supervision Stand pivot transfers: Supervision General transfer comment: Overall, pt seemed steadier on feet today.  No physical assist needed.     Exercise     Balance Balance Overall balance assessment: No apparent balance deficits (not formally assessed) Sitting-balance support: Bilateral upper extremity supported;Feet supported Sitting balance-Leahy Scale: Good Standing balance support: Bilateral upper extremity supported;During functional activity Standing balance-Leahy Scale: Good Standing balance comment: Pt stood at sink for 5 min grooming with no assist.   End of Session OT - End of Session Activity Tolerance: Patient tolerated treatment well Patient left: in chair;with call bell/phone within reach Nurse Communication: Mobility status  GO     Glenford Peers 10/06/2013, 10:49 AM [phone]

## 2013-10-06 NOTE — Progress Notes (Signed)
UR Completed Rumi Taras Graves-Bigelow, RN,BSN [phone]  

## 2013-10-06 NOTE — Progress Notes (Signed)
Stroke Team Progress Note  HISTORY 80 y.o. female no history of hypertension, hyperlipidemia, previous stroke and vitamin B12 deficiency, presenting with acute onset of left facial weakness and slurred speech as well as gaze deviation to the right side at 7:30 AM today. No extremity weakness was noted. This has been on Plavix 75 mg per day. 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 NIH stroke score was 7. Patient was deemed a candidate for intravenous thrombolytic therapy with TPA which was administered.   SUBJECTIVE Son [name] is at the bedside. [name] lives with her. He has planned for someone to come in and check on her often.  OBJECTIVE Most recent Vital Signs: Filed Vitals:   10/05/13 1320 10/05/13 1418 10/05/13 2100 10/06/13 0530  BP: 150/69 131/74 151/79 165/65  Pulse: 79 79 73 77  Temp:  97.7 F (36.5 C) 97.9 F (36.6 C) 97.7 F (36.5 C)  TempSrc:  Oral Oral Oral  Resp: 24 16 15 15   Height:      Weight:      SpO2: 99% 100% 97% 96%   CBG (last 3)   Recent Labs  10/04/13 0814  GLUCAP 119*    IV Fluid Intake:   . sodium chloride 75 mL/hr at 10/05/13 0700    MEDICATIONS  .  stroke: mapping our early stages of recovery book   Does not apply Once  . aspirin EC  325 mg Oral Daily  . clopidogrel  75 mg Oral Q breakfast  . enoxaparin (LOVENOX) injection  40 mg Subcutaneous Q24H  . pantoprazole  40 mg Oral Q1200  . potassium chloride  20 mEq Oral BID  . simvastatin  20 mg Oral q1800   PRN:  acetaminophen, acetaminophen, labetalol  Diet:  General Activity:  Up with assistance DVT Prophylaxis:  lovenox/scds  CLINICALLY SIGNIFICANT STUDIES Basic Metabolic Panel:   Recent Labs Lab 10/04/13 0818 10/04/13 0825  NA 138 140  K 3.5* 3.3*  CL 98 103  CO2 23  --   GLUCOSE 125* 126*  BUN 18 18  CREATININE 0.89 1.00  CALCIUM 9.3  --    Liver Function Tests:   Recent Labs Lab 10/04/13 0818  AST 20  ALT 16  ALKPHOS 95   BILITOT 0.7  PROT 7.1  ALBUMIN 3.6   CBC:   Recent Labs Lab 10/04/13 0818 10/04/13 0825  WBC 6.7  --   NEUTROABS 3.7  --   HGB 12.8 13.6  HCT 37.1 40.0  MCV 90.0  --   PLT 210  --    Coagulation:   Recent Labs Lab 10/04/13 0818  LABPROT 13.0  INR 1.00   Cardiac Enzymes: No results found for this basename: CKTOTAL, CKMB, CKMBINDEX, TROPONINI,  in the last 168 hours Urinalysis:   Recent Labs Lab 10/04/13 2030  [place] 1.010  PHURINE 6.5  GLUCOSEU NEGATIVE  HGBUR NEGATIVE  BILIRUBINUR NEGATIVE  KETONESUR NEGATIVE  PROTEINUR NEGATIVE  UROBILINOGEN 0.2  NITRITE NEGATIVE  LEUKOCYTESUR MODERATE*   Lipid Panel    Component Value Date/Time   CHOL 85 10/05/2013 0330   TRIG 83 10/05/2013 0330   HDL 34* 10/05/2013 0330   CHOLHDL 2.5 10/05/2013 0330   VLDL 17 10/05/2013 0330   LDLCALC 34 10/05/2013 0330   HgbA1C  Lab Results  Component Value Date   HGBA1C 5.4 10/05/2013    Urine Drug Screen:     Component Value Date/Time   LABOPIA NONE DETECTED  10/04/2013 2030   [place] 10/04/2013 2030   LABBENZ NONE DETECTED 10/04/2013 2030   AMPHETMU NONE DETECTED 10/04/2013 2030   [place] 10/04/2013 2030   LABBARB NONE DETECTED 10/04/2013 2030    Alcohol Level:   Recent Labs Lab 10/04/13 0818  ETH <11     CT of the brain  No definite acute infarct  MRI of the brain  10/05/2013    1. Subcentimeter acute right frontal lobe infarct. 2. Old right occipital lobe infarct. 3. Mild chronic small vessel ischemic disease. 4. Unchanged, small left lateral convexity meningioma.  MRA of the brain  10/05/2013    MRA without evidence of major intracranial arterial occlusion or significant stenosis.     2D Echocardiogram  EF 55-60% with no source of embolus.   Carotid Doppler  No evidence of hemodynamically significant internal carotid artery stenosis. Vertebral artery flow is antegrade.   CXR  10/04/2013    1. No radiographic evidence of acute  cardiopulmonary disease. 2. Atherosclerosis.     EKG  normal EKG, normal sinus rhythm, unchanged from previous tracings. For complete results please see formal report.   Therapy Recommendations no PT, 3N1  Physical Exam  Neurologic Examination:  Mental Status:  Alert, oriented, thought content appropriate. Speech at baseline. Able to follow commands without difficulty.  Cranial Nerves:  II-no visual deficits III/IV/VI-Pupils were equal and reacted. Eyes are conjugately deviated to the right side and did not go past midline toward the left side.  V/VII-no facial numbness; moderate left lower facial weakness.  VIII-normal.  Motor: 5/5 bilaterally with normal tone and bulk  Sensory: Normal throughout.  Deep Tendon Reflexes: 2+ and symmetric.  Plantars: Extensor bilaterally  Cerebellar: Normal finger-to-nose testing.  Carotid auscultation: Normal   ASSESSMENT [name] is a 80 y.o. female no history of hypertension, hyperlipidemia, previous stroke and vitamin B12 deficiency, presenting with acute onset of left facial weakness and slurred speech as well as gaze deviation to the right side. S/p IV TPA. Imaging confirms a small right frontal lobe infarct. Infarct felt to be thrombotic secondary to small vessel disease. On aspirin and plavix currently. No therapy needs.   HTN Hyperlipidemia, LDL 34, on lipitor 20 mg daily PTA, now on zocor 20 mg daily, goal LDL < 100  Hx stroke  family hx stroke   Hospital day # 2  TREATMENT/PLAN  Continue plavix for secondary stroke prevention  Will discontinue aspirin. No indication for dual antiplatelets.  3N1 for home use  Anticipate ok for discharge home today  Burnetta Sabin, MSN, RN, ANVP-BC, ANP-BC, [facility] Pager: [phone] 10/06/2013 8:32 AM  I have personally obtained a history, examined the patient, evaluated imaging results, and formulated the assessment and plan of care. I agree with the  above. Antony Contras, MD

## 2013-10-06 NOTE — Discharge Instructions (Signed)
STROKE/TIA DISCHARGE INSTRUCTIONS SMOKING Cigarette smoking nearly doubles your risk of having a stroke & is the single most alterable risk factor  If you smoke or have smoked in the last 12 months, you are advised to quit smoking for your health.  Most of the excess cardiovascular risk related to smoking disappears within a year of stopping.  Ask you doctor about anti-smoking medications  [place] Quit Line: 1-800-QUIT NOW  Free Smoking Cessation Classes [phone]  CHOLESTEROL Know your levels; limit fat & cholesterol in your diet  Lipid Panel     Component Value Date/Time   CHOL 85 10/05/2013 0330   TRIG 83 10/05/2013 0330   HDL 34* 10/05/2013 0330   CHOLHDL 2.5 10/05/2013 0330   VLDL 17 10/05/2013 0330   LDLCALC 34 10/05/2013 0330      Many patients benefit from treatment even if their cholesterol is at goal.  Goal: Total Cholesterol (CHOL) less than 160  Goal:  Triglycerides (TRIG) less than 150  Goal:  HDL greater than 40  Goal:  LDL (LDLCALC) less than 100   BLOOD PRESSURE American Stroke Association blood pressure target is less that 120/80 mm/Hg  Your discharge blood pressure is:  BP: 152/72 mmHg  Monitor your blood pressure  Limit your salt and alcohol intake  Many individuals will require more than one medication for high blood pressure  DIABETES (A1c is a blood sugar average for last 3 months) Goal HGBA1c is under 7% (HBGA1c is blood sugar average for last 3 months)  Diabetes: No known diagnosis of diabetes    Lab Results  Component Value Date   HGBA1C 5.4 10/05/2013     Your HGBA1c can be lowered with medications, healthy diet, and exercise.  Check your blood sugar as directed by your physician  Call your physician if you experience unexplained or low blood sugars.  PHYSICAL ACTIVITY/REHABILITATION Goal is 30 minutes at least 4 days per week  Activity: Increase activity slowly, Therapies:  Return to work:  Activity decreases your risk of heart attack and stroke  and makes your heart stronger.  It helps control your weight and blood pressure; helps you relax and can improve your mood.  Participate in a regular exercise program.  Talk with your doctor about the best form of exercise for you (dancing, walking, swimming, cycling).  DIET/WEIGHT Goal is to maintain a healthy weight  Your discharge diet is: General /heart healthy Your height is:  Height: 5\' 2"  (157.5 cm) Your current weight is: Weight: 67.5 kg (148 lb 13 oz) Your Body Mass Index (BMI) is:  BMI (Calculated): 26.9  Following the type of diet specifically designed for you will help prevent another stroke.  Your goal weight range is:  104-141 lbs  Your goal Body Mass Index (BMI) is 19-24.  Healthy food habits can help reduce 3 risk factors for stroke:  High cholesterol, hypertension, and excess weight.  RESOURCES Stroke/Support Group:  Call [phone]   STROKE EDUCATION PROVIDED/REVIEWED AND GIVEN TO PATIENT Stroke warning signs and symptoms How to activate emergency medical system (call 911). Medications prescribed at discharge. Need for follow-up after discharge. Personal risk factors for stroke. Pneumonia vaccine given: No Flu vaccine given: No My questions have been answered, the writing is legible, and I understand these instructions.  I will adhere to these goals & educational materials that have been provided to me after my discharge from the hospital.

## 2013-10-06 NOTE — Progress Notes (Signed)
DC orders received.  Patient stable with no S/S of distress.  Medication and discharge information reviewed with patient and patient's daughter.  Patient DC home with daughter. Christine Middleton  

## 2013-10-06 NOTE — Discharge Summary (Signed)
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    hypertension   Hyperlipidemia   Hx stroke   Family hx of stroke  Past Medical History  Diagnosis Date  . Hyperlipidemia   . GERD (gastroesophageal reflux disease)   . Hypertension   . Shingles   . Diverticulitis   . Esophageal stricture   . Vitamin B 12 deficiency   . Stroke    Past Surgical History  Procedure Laterality Date  . Appendectomy    . Abdominal hysterectomy    . Eye surgery    . Tonsillectomy    . Colonoscopy  12-09-08    per Dr. Sharlett Iles (incomplete along with barium enema 12-10-08) with hemorrhoids  and diverticulae  . Esophagogastroduodenoscopy  09-2004    per Dr. Sharlett Iles with dilatation       Medication List         atenolol 100 MG tablet  Commonly known as:  TENORMIN  Take 100 mg by mouth daily.     clopidogrel 75 MG tablet  Commonly known as:  PLAVIX  Take 75 mg by mouth daily with breakfast.     hydrochlorothiazide 25 MG tablet  Commonly known as:  HYDRODIURIL  Take 25 mg by mouth daily.     losartan 100 MG tablet  Commonly known as:  COZAAR  Take 100 mg by mouth daily.     pantoprazole 40 MG tablet  Commonly known as:  PROTONIX  Take 40 mg by mouth daily.     potassium gluconate 595 MG Tabs tablet  Take 595 mg by mouth daily.     simvastatin 20 MG tablet  Commonly known as:  ZOCOR  Take 20 mg by mouth at bedtime.        LABORATORY STUDIES CBC    Component Value Date/Time   WBC 6.7 10/04/2013 0818   RBC 4.12 10/04/2013 0818   HGB 13.6 10/04/2013 0825   HCT 40.0 10/04/2013 0825   PLT 210 10/04/2013 0818   MCV 90.0 10/04/2013 0818   MCH 31.1 10/04/2013 0818    MCHC 34.5 10/04/2013 0818   RDW 13.0 10/04/2013 0818   LYMPHSABS 1.7 10/04/2013 0818   MONOABS 0.7 10/04/2013 0818   EOSABS 0.6 10/04/2013 0818   BASOSABS 0.1 10/04/2013 0818   CMP    Component Value Date/Time   NA 140 10/04/2013 0825   K 3.3* 10/04/2013 0825   CL 103 10/04/2013 0825   CO2 23 10/04/2013 0818   GLUCOSE 126* 10/04/2013 0825   GLUCOSE 104* 05/28/2006 1104   BUN 18 10/04/2013 0825   CREATININE 1.00 10/04/2013 0825   CALCIUM 9.3 10/04/2013 0818   PROT 7.1 10/04/2013 0818   ALBUMIN 3.6 10/04/2013 0818   AST 20 10/04/2013 0818   ALT 16 10/04/2013 0818   ALKPHOS 95 10/04/2013 0818   BILITOT 0.7 10/04/2013 0818   GFRNONAA 56* 10/04/2013 0818   GFRAA 65* 10/04/2013 0818   COAGS Lab Results  Component Value Date   INR 1.00 10/04/2013   INR 1.06 07/10/2009   Lipid Panel    Component Value Date/Time   CHOL 85 10/05/2013 0330  TRIG 83 10/05/2013 0330   HDL 34* 10/05/2013 0330   CHOLHDL 2.5 10/05/2013 0330   VLDL 17 10/05/2013 0330   LDLCALC 34 10/05/2013 0330   HgbA1C  Lab Results  Component Value Date   HGBA1C 5.4 10/05/2013   Cardiac Panel (last 3 results) No results found for this basename: CKTOTAL, CKMB, TROPONINI, RELINDX,  in the last 72 hours Urinalysis    Component Value Date/Time   COLORURINE YELLOW 10/04/2013 2030   [place] 10/04/2013 2030   [place] 1.010 10/04/2013 2030   [place] 6.5 10/04/2013 2030   [place] 10/04/2013 2030   [place] 10/04/2013 2030   HGBUR negative 08/31/2009 [place] 10/04/2013 2030   BILIRUBINUR n 07/21/2013 [place] 10/04/2013 2030   PROTEINUR NEGATIVE 10/04/2013 2030   UROBILINOGEN 0.2 10/04/2013 2030   UROBILINOGEN 1.0 07/21/2013 1157   NITRITE NEGATIVE 10/04/2013 2030   NITRITE n 07/21/2013 1157   LEUKOCYTESUR MODERATE* 10/04/2013 2030   Urine Drug Screen     Component Value Date/Time   LABOPIA NONE DETECTED 10/04/2013 2030   COCAINSCRNUR NONE DETECTED 10/04/2013 2030   LABBENZ NONE DETECTED 10/04/2013 2030   AMPHETMU NONE  DETECTED 10/04/2013 2030   [place] DETECTED 10/04/2013 2030   LABBARB NONE DETECTED 10/04/2013 2030    Alcohol Level    Component Value Date/Time   ETH <11 10/04/2013 0818    SIGNIFICANT DIAGNOSTIC STUDIES CT of the brain No definite acute infarct  MRI of the brain 10/05/2013 1. Subcentimeter acute right frontal lobe infarct. 2. Old right occipital lobe infarct. 3. Mild chronic small vessel ischemic disease. 4. Unchanged, small left lateral convexity meningioma.  MRA of the brain 10/05/2013 MRA without evidence of major intracranial arterial occlusion or significant stenosis.  2D Echocardiogram EF 55-60% with no source of embolus.  Carotid Doppler No evidence of hemodynamically significant internal carotid artery stenosis. Vertebral artery flow is antegrade.  CXR 10/04/2013 1. No radiographic evidence of acute cardiopulmonary disease. 2. Atherosclerosis.  EKG normal EKG, normal sinus rhythm, unchanged from previous tracings. For complete results please see formal report.      History of Present Illness  [name] is a 80 y.o. female no history of hypertension, hyperlipidemia, previous stroke and vitamin B12 deficiency, presenting with acute onset of left facial weakness and slurred speech as well as gaze deviation to the right side at 7:30 AM today 10/04/2013. No extremity weakness was noted. Has been on Plavix 75 mg per day. 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 NIH stroke score was 7. Patient was deemed a candidate for intravenous thrombolytic therapy with TPA which was administered. She was admitted to the ICU for further evaluation and treatment.  Hospital Course Imaging confirms a small right frontal lobe infarct. Infarct felt to be thrombotic secondary to small vessel disease. On aspirin and plavix currently. Patient was on plavix prior to admission. Plavix to be continued for secondary stroke prevention (did consider Aggrenox, but given side effect of  headache it was opted against).   Patient with vascular risk factors of:  HTN Hyperlipidemia, LDL 34, on lipitor 20 mg daily PTA, now on zocor 20 mg daily, goal LDL < 100  Hx stroke  family hx stroke   Patient with no continued stroke symptoms. Physical therapy, occupational therapy and speech therapy evaluated patient. They recommend no follow up therapy needs  Discharge Exam  Blood pressure 162/69, pulse 72, temperature  97.8 F (36.6 C), temperature source Oral, resp. rate 17, height 5\' 2"  (1.575 m), weight 67.5 kg (148 lb 13 oz), SpO2 99.00%.  Mental Status:  Alert, oriented, thought content appropriate. Speech at baseline. Able to follow commands without difficulty.  Cranial Nerves:  II-no visual deficits  III/IV/VI-Pupils were equal and reacted. Eyes are conjugately deviated to the right side and did not go past midline toward the left side.  V/VII-no facial numbness; moderate left lower facial weakness.  VIII-normal.  Motor: 5/5 bilaterally with normal tone and bulk  Sensory: Normal throughout.  Deep Tendon Reflexes: 2+ and symmetric.  Plantars: Extensor bilaterally  Cerebellar: Normal finger-to-nose testing.  Carotid auscultation: Normal   Discharge Diet   General thin liquids  Discharge Plan    Disposition:  home   clopidogrel 75 mg orally every day for secondary stroke prevention.  3N1 for home use  Ongoing risk factor control by Primary Care Physician.  Follow-up FRY,STEPHEN A, MD in 1 month.  Follow-up with Dr. Antony Contras, Stroke [facility] in 2 months.  35 minutes were spent preparing discharge.  Signed Burnetta Sabin, MSN, RN, ANVP-BC, AGPCNP-BC [facility] Nurse Practitioner 10/06/2013, 2:11 PM  I have personally examined this patient, reviewed pertinent data and developed the plan of care. I agree with above. Antony Contras, MD

## 2013-10-07 ENCOUNTER — Telehealth: Payer: Self-pay | Admitting: Neurology

## 2013-10-07 NOTE — Telephone Encounter (Signed)
Patient was discharged on clopidogrel which is a better blood thinner than aspirin and hence she was advised to stop aspirin and take clopidogrel only.

## 2013-10-07 NOTE — Telephone Encounter (Signed)
I spoke to the daughter [name] and advised patient to stay on clopidogrel alone

## 2013-10-07 NOTE — Telephone Encounter (Signed)
Pt's daughter, [name], called.  She wanted to ask about whether or not her mother should be taking Asprin.  She stated that Dr. Leonie Man told her it was ok to take an asprin. The discharge nurse called and spoke to the PA and she said not to take an asprin. And the day before the nurse gave her an asprin.  So they are unsure whether she should or should not take an asprin.  Please call at [phone] and leave a message if possible.  Thank you.

## 2013-10-07 NOTE — Telephone Encounter (Signed)
Patient's daughter [name] calling about whether or not patient can take an aspirin. Daughter stated that the discharge nurse called and spoke with PA and she stated not to take aspirin. Patient's daughter is confused because Dr. Leonie Man stated that the patient could take an aspirin. Please advise.

## 2013-10-18 ENCOUNTER — Other Ambulatory Visit: Payer: Self-pay | Admitting: Family Medicine

## 2013-10-29 ENCOUNTER — Encounter: Payer: Self-pay | Admitting: Family Medicine

## 2013-10-29 ENCOUNTER — Ambulatory Visit (INDEPENDENT_AMBULATORY_CARE_PROVIDER_SITE_OTHER): Payer: Medicare Other | Admitting: Family Medicine

## 2013-10-29 VITALS — BP 148/72 | HR 72 | Temp 98.1°F | Ht 62.0 in | Wt 142.0 lb

## 2013-10-29 DIAGNOSIS — I639 Cerebral infarction, unspecified: Secondary | ICD-10-CM

## 2013-10-29 DIAGNOSIS — I6350 Cerebral infarction due to unspecified occlusion or stenosis of unspecified cerebral artery: Secondary | ICD-10-CM

## 2013-10-29 DIAGNOSIS — I10 Essential (primary) hypertension: Secondary | ICD-10-CM

## 2013-10-29 NOTE — Progress Notes (Signed)
Pre visit review using our clinic review tool, if applicable. No additional management support is needed unless otherwise documented below in the visit note. 

## 2013-10-29 NOTE — Progress Notes (Signed)
   Subjective:    Patient ID: Christine Middleton, female    DOB: 1923-07-16, 80 y.o.   MRN: [id number]  HPI Here to follow up a hospital stay from 10-04-13 to 10-06-13 for a small right frontal lobe stroke. She presented with left face weakness and slurred speech. She was given TPA and her symptoms responded fairly quickly. She now has no neurologic deficits at all but she complains of generalized fatigue. Appetite is good. She has been under tremendous stress lately, in fact while she was in the hospital her husband was also hospitalized with pneumonia. He is now back home with her. She remains on Plavix as she was before the stroke. It was decided that she should not take aspirin.    Review of Systems  Constitutional: Positive for fatigue.  Respiratory: Negative.   Cardiovascular: Negative.   Neurological: Negative.        Objective:   Physical Exam  Constitutional: She is oriented to person, place, and time. She appears well-developed and well-nourished. No distress.  Cardiovascular: Normal rate, regular rhythm, normal heart sounds and intact distal pulses.   Pulmonary/Chest: Effort normal and breath sounds normal.  Neurological: She is alert and oriented to person, place, and time. She has normal reflexes. No cranial nerve deficit. She exhibits normal muscle tone. Coordination normal.          Assessment & Plan:  She seems to have recovered from the stroke. Follow up with [place] prn, and she will see Dr. Leonie Man in 2 months

## 2013-11-11 ENCOUNTER — Telehealth: Payer: Self-pay | Admitting: Family Medicine

## 2013-11-11 NOTE — Telephone Encounter (Signed)
Please add the following Re-fills, 90 day supply:   hydrochlorothiazide (HYDRODIURIL) 25 MG tablet simvastatin (ZOCOR) 20 MG tablet clopidogrel (PLAVIX) 75 MG tablet

## 2013-11-11 NOTE — Telephone Encounter (Signed)
[id number] [place], [place] - [address] is requesting re-fill on atenolol (TENORMIN) 100 MG tablet, 90 day supply

## 2013-11-14 MED ORDER — ATENOLOL 100 MG PO TABS: 100.0000 mg | ORAL_TABLET | Freq: Every day | ORAL | Status: DC

## 2013-11-14 MED ORDER — CLOPIDOGREL BISULFATE 75 MG PO TABS: 75.0000 mg | ORAL_TABLET | Freq: Every day | ORAL | Status: DC

## 2013-11-14 MED ORDER — SIMVASTATIN 20 MG PO TABS
20.0000 mg | ORAL_TABLET | Freq: Every day | ORAL | Status: DC
Start: 2013-11-14 — End: 2014-06-11

## 2013-11-14 MED ORDER — HYDROCHLOROTHIAZIDE 25 MG PO TABS: 25.0000 mg | ORAL_TABLET | Freq: Every day | ORAL | Status: DC

## 2013-11-14 NOTE — Telephone Encounter (Signed)
I sent all 4 scripts e-scribe for a 90 day supply to [facility].

## 2014-01-21 ENCOUNTER — Encounter: Payer: Self-pay | Admitting: Nurse Practitioner

## 2014-01-21 ENCOUNTER — Encounter (INDEPENDENT_AMBULATORY_CARE_PROVIDER_SITE_OTHER): Payer: Self-pay

## 2014-01-21 ENCOUNTER — Ambulatory Visit (INDEPENDENT_AMBULATORY_CARE_PROVIDER_SITE_OTHER): Payer: Medicare Other | Admitting: Nurse Practitioner

## 2014-01-21 VITALS — BP 130/75 | HR 76 | Temp 98.7°F | Ht 61.5 in | Wt 145.0 lb

## 2014-01-21 DIAGNOSIS — I63321 Cerebral infarction due to thrombosis of right anterior cerebral artery: Secondary | ICD-10-CM

## 2014-01-21 DIAGNOSIS — I6330 Cerebral infarction due to thrombosis of unspecified cerebral artery: Secondary | ICD-10-CM

## 2014-01-21 NOTE — Progress Notes (Signed)
PATIENT: Christine Middleton DOB: 1922/09/03  REASON FOR VISIT: hospital follow up for stroke HISTORY FROM: patient, daughter  HISTORY OF PRESENT ILLNESS: Christine Middleton is a 80 y.o. female no history of hypertension, hyperlipidemia, previous stroke and vitamin B12 deficiency, presenting with acute onset of left facial weakness and slurred speech as well as gaze deviation to the right side on 10/04/2013. No extremity weakness was noted. Was on Plavix 75 mg per day. 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 NIH stroke score was 7. Patient was deemed a candidate for intravenous thrombolytic therapy with TPA which was administered. MRI Imaging confirmed a small right frontal lobe infarct. Infarct felt to be thrombotic secondary to small vessel disease. Plavix to be continued for secondary stroke prevention (did consider Aggrenox, but given side effect of headache it was opted against). Vascular risk factors of HTN, Hyperlipidemia, Hx stroke, family hx stroke. Patient with no continued stroke symptoms. Physical therapy, occupational therapy and speech therapy evaluated patient. They recommend no follow up therapy needs.  Since hospital discharge she has been well.  She has had no recurrent stroke or TIA symptoms. Blood pressure is well controlled it is 130/75 in office today. She is tolerating Plavix well with no signs of significant bleeding or bruising.  REVIEW OF SYSTEMS: Full 14 system review of systems performed and notable only for:  Appetite change, hearing loss, ringing in ears, cough, restless legs, insomnia, frequent waking, moles, incontinence of bladder, bruise easily, memory loss  ALLERGIES: Allergies  Allergen Reactions  . Amoxicillin     REACTION: rash  . Lisinopril   . Nitrofurantoin     HOME MEDICATIONS: Outpatient Prescriptions Prior to Visit  Medication Sig Dispense Refill  . atenolol (TENORMIN) 100 MG tablet Take 1 tablet (100 mg total) by  mouth daily.  90 tablet  1  . clopidogrel (PLAVIX) 75 MG tablet Take 1 tablet (75 mg total) by mouth daily with breakfast.  90 tablet  1  . hydrochlorothiazide (HYDRODIURIL) 25 MG tablet Take 1 tablet (25 mg total) by mouth daily.  90 tablet  1  . losartan (COZAAR) 100 MG tablet Take 100 mg by mouth daily.      . pantoprazole (PROTONIX) 40 MG tablet Take 40 mg by mouth daily.      . potassium gluconate 595 MG TABS tablet Take 595 mg by mouth daily.      . simvastatin (ZOCOR) 20 MG tablet Take 1 tablet (20 mg total) by mouth at bedtime.  90 tablet  1   No facility-administered medications prior to visit.   PHYSICAL EXAM  Filed Vitals:   01/21/14 1430  BP: 130/75  Pulse: 76  Temp: 98.7 F (37.1 C)  TempSrc: Oral  Height: 5' 1.5" (1.562 m)  Weight: 145 lb (65.772 kg)   Body mass index is 26.96 kg/(m^2).  Visual Acuity Screening   Right eye Left eye Both eyes  Without correction:     With correction: 20/40 20/40    Generalized: Well developed, well groomed, elderly Caucasian female in no acute distress  Head: normocephalic and atraumatic. Oropharynx benign  Neck: Supple, no carotid bruits  Cardiac: Regular rate rhythm, no murmur  Musculoskeletal: No deformity   Neurological examination  Mentation: Alert oriented to time, place, history taking. Follows all commands speech and language fluent Cranial nerve II-XII: Fundoscopic exam not done. Pupils were equal round reactive to light extraocular movements were full, visual field were  full on confrontational test. Facial sensation and strength were normal. hearing was intact to finger rubbing bilaterally. Uvula tongue midline. head turning and shoulder shrug and were normal and symmetric.Tongue protrusion into cheek strength was normal. Motor: The motor testing reveals 5 over 5 strength of all 4 extremities. Good symmetric motor tone is noted throughout.  Sensory: Sensory testing is intact to soft touch on all 4 extremities. No evidence  of extinction is noted.  Coordination: Cerebellar testing reveals good finger-nose-finger and heel-to-shin bilaterally.  Gait and station: Gait is normal. Tandem gait is normal. Romberg is negative.  Reflexes: Deep tendon reflexes are symmetric and normal bilaterally.   NIHSS: 0 mRan: 0  ASSESSMENT AND PLAN Christine Middleton is a 80 y.o. female no history of hypertension, hyperlipidemia, previous stroke and vitamin B12 deficiency, presenting with acute onset of left facial weakness and slurred speech as well as gaze deviation to the right side on 10/04/2013.  MRI Imaging confirmed a small right frontal lobe infarct. Infarct felt to be thrombotic secondary to small vessel disease.  Patient with no continued stroke symptoms.   I had a long discussion with the patient and family regarding her recent stroke, discussed results of evaluation in the hospital and answered questions.  Continue clopidogrel 75 mg orally every day  for secondary stroke prevention and maintain strict control of hypertension with blood pressure goal below 140/90, and lipids with LDL cholesterol goal below 100 mg/dL. Followup in the future in 4 months, sooner as needed.  Philmore Pali, MSN, NP-C 01/21/2014, 2:46 PM [facility] [address] [place],  [address] [phone]  Note: This document was prepared with digital dictation and possible smart phrase technology. Any transcriptional errors that result from this process are unintentional.

## 2014-01-21 NOTE — Patient Instructions (Signed)
Continue clopidogrel 75 mg orally every day  for secondary stroke prevention and maintain strict control of hypertension with blood pressure goal below 140/90, and lipids with LDL cholesterol goal below 100 mg/dL. Followup in the future in 4 months, sooner as needed.  STROKE/TIA INSTRUCTIONS SMOKING Cigarette smoking nearly doubles your risk of having a stroke & is the single most alterable risk factor  If you smoke or have smoked in the last 12 months, you are advised to quit smoking for your health.  Most of the excess cardiovascular risk related to smoking disappears within a year of stopping.  Ask you doctor about anti-smoking medications  [place] Quit Line: 1-800-QUIT NOW  Free Smoking Cessation Classes [phone]  CHOLESTEROL Know your levels; limit fat & cholesterol in your diet  Lab Results  Component Value Date   CHOL 85 10/05/2013   HDL 34* 10/05/2013   LDLCALC 34 10/05/2013   LDLDIRECT 113.3 07/18/2012   TRIG 83 10/05/2013   CHOLHDL 2.5 10/05/2013      Many patients benefit from treatment even if their cholesterol is at goal.  Goal: Total Cholesterol less than 160  Goal:  LDL less than 100  Goal:  HDL greater than 40  Goal:  Triglycerides less than 150  BLOOD PRESSURE American Stroke Association blood pressure target is less that 120/80 mm/Hg  Your discharge blood pressure is:  BP: 130/75 mmHg  Monitor your blood pressure  Limit your salt and alcohol intake  Many individuals will require more than one medication for high blood pressure  DIABETES (A1c is a blood sugar average for last 3 months) Goal A1c is under 7% (A1c is blood sugar average for last 3 months)  Diabetes: No known diagnosis of diabetes    Lab Results  Component Value Date   HGBA1C 5.4 10/05/2013    Your A1c can be lowered with medications, healthy diet, and exercise.  Check your blood sugar as directed by your physician  Call your physician if you experience unexplained or low blood sugars.  PHYSICAL  ACTIVITY/REHABILITATION Goal is 30 minutes at least 4 days per week    Activity decreases your risk of heart attack and stroke and makes your heart stronger.  It helps control your weight and blood pressure; helps you relax and can improve your mood.  Participate in a regular exercise program.  Talk with your doctor about the best form of exercise for you (dancing, walking, swimming, cycling).  DIET/WEIGHT Goal is to maintain a healthy weight  Your height is:  Height: 5' 1.5" (156.2 cm) Your current weight is: Weight: 145 lb (65.772 kg) Your body Mass Index (BMI) is:  BMI (Calculated): 27  Following the type of diet specifically designed for you will help prevent another stroke.  Your goal Body Mass Index (BMI) is 19-24.  Healthy food habits can help reduce 3 risk factors for stroke:  High cholesterol, hypertension, and excess weight.

## 2014-01-23 ENCOUNTER — Ambulatory Visit: Payer: Medicare Other | Admitting: Internal Medicine

## 2014-01-30 ENCOUNTER — Other Ambulatory Visit: Payer: Self-pay | Admitting: Family Medicine

## 2014-01-30 NOTE — Progress Notes (Signed)
I agree with above 

## 2014-02-04 ENCOUNTER — Ambulatory Visit (INDEPENDENT_AMBULATORY_CARE_PROVIDER_SITE_OTHER): Payer: Medicare Other | Admitting: Family Medicine

## 2014-02-04 ENCOUNTER — Encounter: Payer: Self-pay | Admitting: Family Medicine

## 2014-02-04 VITALS — BP 163/76 | HR 73 | Temp 98.9°F | Ht 61.5 in | Wt 142.0 lb

## 2014-02-04 DIAGNOSIS — J0100 Acute maxillary sinusitis, unspecified: Secondary | ICD-10-CM

## 2014-02-04 MED ORDER — AZITHROMYCIN 250 MG PO TABS: ORAL_TABLET | ORAL | Status: DC

## 2014-02-04 NOTE — Progress Notes (Signed)
   Subjective:    Patient ID: Christine Middleton, female    DOB: 05-12-1923, 80 y.o.   MRN: [id number]  HPI Here for 3 weeks of sinus pressure, PND, and a dry cough. No fever.    Review of Systems  Constitutional: Negative.   HENT: Positive for congestion, postnasal drip and sinus pressure.   Eyes: Negative.   Respiratory: Positive for cough.        Objective:   Physical Exam  Constitutional: She appears well-developed and well-nourished.  HENT:  Right Ear: External ear normal.  Left Ear: External ear normal.  Nose: Nose normal.  Mouth/Throat: Oropharynx is clear and moist.  Eyes: Conjunctivae are normal.  Pulmonary/Chest: Effort normal and breath sounds normal.  Lymphadenopathy:    She has no cervical adenopathy.          Assessment & Plan:  Add Mucinex

## 2014-02-04 NOTE — Progress Notes (Signed)
Pre visit review using our clinic review tool, if applicable. No additional management support is needed unless otherwise documented below in the visit note. 

## 2014-03-10 ENCOUNTER — Encounter: Payer: Self-pay | Admitting: Gastroenterology

## 2014-03-29 ENCOUNTER — Other Ambulatory Visit: Payer: Self-pay | Admitting: Family Medicine

## 2014-05-27 ENCOUNTER — Encounter: Payer: Self-pay | Admitting: Nurse Practitioner

## 2014-05-27 ENCOUNTER — Ambulatory Visit (INDEPENDENT_AMBULATORY_CARE_PROVIDER_SITE_OTHER): Payer: Medicare Other | Admitting: Nurse Practitioner

## 2014-05-27 VITALS — BP 126/72 | HR 67 | Ht 61.5 in | Wt 148.8 lb

## 2014-05-27 DIAGNOSIS — I63321 Cerebral infarction due to thrombosis of right anterior cerebral artery: Secondary | ICD-10-CM

## 2014-05-27 MED ORDER — CLOPIDOGREL BISULFATE 75 MG PO TABS: 75.0000 mg | ORAL_TABLET | Freq: Every day | ORAL | Status: DC

## 2014-05-27 NOTE — Progress Notes (Signed)
I agree with the above plan 

## 2014-05-27 NOTE — Progress Notes (Signed)
PATIENT: Christine Middleton DOB: 03/19/1923  REASON FOR VISIT: routine follow up for stroke HISTORY FROM:  Patient and daughter  HISTORY OF PRESENT ILLNESS: Christine Middleton is a 80 y.o. female with history of hypertension, hyperlipidemia, previous stroke and vitamin B12 deficiency, presenting with acute onset of left facial weakness and slurred speech as well as gaze deviation to the right side on 10/04/2013. No extremity weakness was noted. Was on Plavix 75 mg per day. 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 NIH stroke score was 7. Patient was deemed a candidate for intravenous thrombolytic therapy with TPA which was administered. MRI Imaging confirmed a small right frontal lobe infarct. Infarct felt to be thrombotic secondary to small vessel disease. Plavix to be continued for secondary stroke prevention (did consider Aggrenox, but given side effect of headache it was opted against). Vascular risk factors of HTN, Hyperlipidemia, Hx stroke, family hx stroke. Patient with no continued stroke symptoms. Physical therapy, occupational therapy and speech therapy evaluated patient. They recommend no follow up therapy needs. Since hospital discharge she has been well. She has had no recurrent stroke or TIA symptoms. Blood pressure is well controlled it is 126/72 in office today. She is tolerating Plavix well with no signs of significant bleeding or bruising. She has regularly schedule annual exam with pcp Dr. Sarajane Jews soon and will have labs done there. She drives some short distances but not on major highways. She is caring for her husband of 61 years who has moderate Alzheimer's dementia.  REVIEW OF SYSTEMS: Full 14 system review of systems performed and notable only for:  Hearing loss, ringing in ears, cough, restless legs, incontinence of bladder, back pain, bruise easily  ALLERGIES: Allergies  Allergen Reactions  . Amoxicillin     REACTION: rash  . Lisinopril   .  Nitrofurantoin     HOME MEDICATIONS: Outpatient Prescriptions Prior to Visit  Medication Sig Dispense Refill  . atenolol (TENORMIN) 100 MG tablet Take 1 tablet (100 mg total) by mouth daily.  90 tablet  1  . hydrochlorothiazide (HYDRODIURIL) 25 MG tablet Take 1 tablet (25 mg total) by mouth daily.  90 tablet  1  . losartan (COZAAR) 100 MG tablet Take 100 mg by mouth daily.      . pantoprazole (PROTONIX) 40 MG tablet TAKE 1 TABLET BY MOUTH EVERY DAY  90 tablet  1  . potassium gluconate 595 MG TABS tablet Take 595 mg by mouth daily.      . simvastatin (ZOCOR) 20 MG tablet Take 1 tablet (20 mg total) by mouth at bedtime.  90 tablet  1  . clopidogrel (PLAVIX) 75 MG tablet Take 1 tablet (75 mg total) by mouth daily with breakfast.  90 tablet  1  . pantoprazole (PROTONIX) 40 MG tablet Take 40 mg by mouth daily.      Marland Kitchen azithromycin (ZITHROMAX) 250 MG tablet As directed  6 tablet  0  . losartan (COZAAR) 100 MG tablet TAKE 1 TABLET EVERY DAY  90 tablet  1   No facility-administered medications prior to visit.    PHYSICAL EXAM Filed Vitals:   05/27/14 1413  BP: 126/72  Pulse: 67  Height: 5' 1.5" (1.562 m)  Weight: 148 lb 12.8 oz (67.495 kg)   Body mass index is 27.66 kg/(m^2).  Generalized: Well developed, well groomed, elderly Caucasian female in no acute distress  Head: normocephalic and atraumatic. Oropharynx benign  Neck: Supple, no carotid  bruits  Cardiac: Regular rate rhythm, no murmur  Musculoskeletal: No deformity  Neurological examination  Mentation: Alert oriented to time, place, history taking. Follows all commands speech and language fluent  Cranial nerve II-XII: Fundoscopic exam not done. Pupils were equal round reactive to light extraocular movements were full, visual field were full on confrontational test. Facial sensation and strength were normal. hearing was intact to finger rubbing bilaterally. Uvula tongue midline. head turning and shoulder shrug and were normal and  symmetric.Tongue protrusion into cheek strength was normal.  Motor: The motor testing reveals 5 over 5 strength of all 4 extremities. Good symmetric motor tone is noted throughout.  Sensory: Sensory testing is intact to soft touch on all 4 extremities. No evidence of extinction is noted.  Coordination: Cerebellar testing reveals good finger-nose-finger and heel-to-shin bilaterally.  Gait and station: Gait is normal. Tandem gait is normal. Romberg is negative.  Reflexes: Deep tendon reflexes are symmetric and normal bilaterally.  NIHSS: 0  mRan: 0    ASSESSMENT: Christine Middleton is a 80 y.o. Caucasian female with history of hypertension, hyperlipidemia, PMR, previous stroke and vitamin B12 deficiency, presenting with acute onset of left facial weakness and slurred speech as well as gaze deviation to the right side on 10/04/2013. MRI Imaging confirmed a small right frontal lobe infarct. Infarct felt to be thrombotic secondary to small vessel disease. Patient with no residual stroke symptoms, doing well.  PLAN: I had a long discussion with the patient and daughter regarding her stroke, discussed risk factor reduction and answered questions.  Continue clopidogrel 75 mg orally every day for secondary stroke prevention and maintain strict control of hypertension with blood pressure goal below 140/90, and lipids with LDL cholesterol goal below 100 mg/dL. Followup in the future in 1 year with Dr. Leonie Man, sooner as needed.  Meds ordered this encounter  Medications  . clopidogrel (PLAVIX) 75 MG tablet    Sig: Take 1 tablet (75 mg total) by mouth daily with breakfast.    Dispense:  90 tablet    Refill:  3    Order Specific Question:  Supervising Provider    Answer:  Leonie Man, PRAMOD [[id number]]   Rudi Rummage LAM, MSN, FNP-BC, A/GNP-C 05/27/2014, 3:05 PM [facility] [address], [place],  [address] [phone]  Note: This document was prepared with digital dictation and  possible smart phrase technology. Any transcriptional errors that result from this process are unintentional.

## 2014-05-27 NOTE — Patient Instructions (Signed)
Continue clopidogrel 75 mg orally every day for secondary stroke prevention and maintain strict control of hypertension with blood pressure goal below 140/90, and lipids with LDL cholesterol goal below 100 mg/dL. Followup in the future in 6 months with Dr. Leonie Man, sooner as needed.   Stroke Prevention Some medical conditions and behaviors are associated with an increased chance of having a stroke. You may prevent a stroke by making healthy choices and managing medical conditions. HOW CAN I REDUCE MY RISK OF HAVING A STROKE?   Stay physically active. Get at least 30 minutes of activity on most or all days.  Do not smoke. It may also be helpful to avoid exposure to secondhand smoke.  Limit alcohol use. Moderate alcohol use is considered to be:  No more than 2 drinks per day for men.  No more than 1 drink per day for nonpregnant women.  Eat healthy foods. This involves:  Eating 5 or more servings of fruits and vegetables a day.  Making dietary changes that address high blood pressure (hypertension), high cholesterol, diabetes, or obesity.  Manage your cholesterol levels.  Making food choices that are high in fiber and low in saturated fat, trans fat, and cholesterol may control cholesterol levels.  Take any prescribed medicines to control cholesterol as directed by your health care provider.  Manage your diabetes.  Controlling your carbohydrate and sugar intake is recommended to manage diabetes.  Take any prescribed medicines to control diabetes as directed by your health care provider.  Control your hypertension.  Making food choices that are low in salt (sodium), saturated fat, trans fat, and cholesterol is recommended to manage hypertension.  Take any prescribed medicines to control hypertension as directed by your health care provider.  Maintain a healthy weight.  Reducing calorie intake and making food choices that are low in sodium, saturated fat, trans fat, and  cholesterol are recommended to manage weight.  Stop drug abuse.  Avoid taking birth control pills.  Talk to your health care provider about the risks of taking birth control pills if you are over 4 years old, smoke, get migraines, or have ever had a blood clot.  Get evaluated for sleep disorders (sleep apnea).  Talk to your health care provider about getting a sleep evaluation if you snore a lot or have excessive sleepiness.  Take medicines only as directed by your health care provider.  For some people, aspirin or blood thinners (anticoagulants) are helpful in reducing the risk of forming abnormal blood clots that can lead to stroke. If you have the irregular heart rhythm of atrial fibrillation, you should be on a blood thinner unless there is a good reason you cannot take them.  Understand all your medicine instructions.  Make sure that other conditions (such as anemia or atherosclerosis) are addressed. SEEK IMMEDIATE MEDICAL CARE IF:   You have sudden weakness or numbness of the face, arm, or leg, especially on one side of the body.  Your face or eyelid droops to one side.  You have sudden confusion.  You have trouble speaking (aphasia) or understanding.  You have sudden trouble seeing in one or both eyes.  You have sudden trouble walking.  You have dizziness.  You have a loss of balance or coordination.  You have a sudden, severe headache with no known cause.  You have new chest pain or an irregular heartbeat. Any of these symptoms may represent a serious problem that is an emergency. Do not wait to see if the symptoms  will go away. Get medical help at once. Call your local emergency services (911 in [place] Do not drive yourself to the hospital. Document Released: 08/24/2004 Document Revised: 12/01/2013 Document Reviewed: 01/17/2013 [facility] Patient Information 2015 [place], [place]. This information is not intended to replace advice given to you by your health care  provider. Make sure you discuss any questions you have with your health care provider.

## 2014-06-11 ENCOUNTER — Other Ambulatory Visit: Payer: Self-pay | Admitting: Family Medicine

## 2014-07-27 ENCOUNTER — Encounter: Payer: Medicare Other | Admitting: Family Medicine

## 2014-07-28 ENCOUNTER — Ambulatory Visit (INDEPENDENT_AMBULATORY_CARE_PROVIDER_SITE_OTHER): Payer: Medicare Other | Admitting: Family Medicine

## 2014-07-28 ENCOUNTER — Encounter: Payer: Self-pay | Admitting: Family Medicine

## 2014-07-28 VITALS — BP 162/86 | HR 65 | Temp 98.4°F | Ht 61.5 in | Wt 148.0 lb

## 2014-07-28 DIAGNOSIS — E538 Deficiency of other specified B group vitamins: Secondary | ICD-10-CM

## 2014-07-28 DIAGNOSIS — R739 Hyperglycemia, unspecified: Secondary | ICD-10-CM

## 2014-07-28 DIAGNOSIS — Z0000 Encounter for general adult medical examination without abnormal findings: Secondary | ICD-10-CM

## 2014-07-28 DIAGNOSIS — Z23 Encounter for immunization: Secondary | ICD-10-CM

## 2014-07-28 LAB — LIPID PANEL
Cholesterol: 203 mg/dL — ABNORMAL HIGH (ref 0–200)
HDL: 80.9 mg/dL (ref >39.00)
LDL Cholesterol: 99 mg/dL (ref 0–99)
NonHDL: 122.1
Total CHOL/HDL Ratio: 3
Triglycerides: 114 mg/dL (ref 0.0–149.0)
VLDL: 22.8 mg/dL (ref 0.0–40.0)

## 2014-07-28 LAB — CBC WITH DIFFERENTIAL/PLATELET
Basophils Absolute: 0.1 10*3/uL (ref 0.0–0.1)
Basophils Relative: 1.1 % (ref 0.0–3.0)
Eosinophils Absolute: 0.4 10*3/uL (ref 0.0–0.7)
Eosinophils Relative: 5.7 % — ABNORMAL HIGH (ref 0.0–5.0)
HCT: 39.8 % (ref 36.0–46.0)
Hemoglobin: 13.2 g/dL (ref 12.0–15.0)
Lymphocytes Relative: 22.1 % (ref 12.0–46.0)
Lymphs Abs: 1.7 10*3/uL (ref 0.7–4.0)
MCHC: 33.2 g/dL (ref 30.0–36.0)
MCV: 92.4 fl (ref 78.0–100.0)
Monocytes Absolute: 0.8 10*3/uL (ref 0.1–1.0)
Monocytes Relative: 10 % (ref 3.0–12.0)
Neutro Abs: 4.6 10*3/uL (ref 1.4–7.7)
Neutrophils Relative %: 61.1 % (ref 43.0–77.0)
Platelets: 221 10*3/uL (ref 150.0–400.0)
RBC: 4.3 Mil/uL (ref 3.87–5.11)
RDW: 12.7 % (ref 11.5–15.5)
WBC: 7.6 10*3/uL (ref 4.0–10.5)

## 2014-07-28 LAB — BASIC METABOLIC PANEL
BUN: 18 mg/dL (ref 6–23)
CO2: 30 mEq/L (ref 19–32)
Calcium: 9.4 mg/dL (ref 8.4–10.5)
Chloride: 105 mEq/L (ref 96–112)
Creatinine, Ser: 1.1 mg/dL (ref 0.4–1.2)
GFR: 51.76 mL/min — ABNORMAL LOW (ref >60.00)
Glucose, Bld: 109 mg/dL — ABNORMAL HIGH (ref 70–99)
Potassium: 4.3 mEq/L (ref 3.5–5.1)
Sodium: 142 mEq/L (ref 135–145)

## 2014-07-28 LAB — HEPATIC FUNCTION PANEL
ALT: 17 U/L (ref 0–35)
AST: 24 U/L (ref 0–37)
Albumin: 4 g/dL (ref 3.5–5.2)
Alkaline Phosphatase: 84 U/L (ref 39–117)
Bilirubin, Direct: 0.1 mg/dL (ref 0.0–0.3)
Total Bilirubin: 1 mg/dL (ref 0.2–1.2)
Total Protein: 7.5 g/dL (ref 6.0–8.3)

## 2014-07-28 LAB — HEMOGLOBIN A1C: Hgb A1c MFr Bld: 6.1 % (ref 4.6–6.5)

## 2014-07-28 LAB — POCT URINALYSIS DIPSTICK
Bilirubin, UA: NEGATIVE
Blood, UA: NEGATIVE
Glucose, UA: NEGATIVE
Ketones, UA: NEGATIVE
Nitrite, UA: NEGATIVE
Protein, UA: NEGATIVE
Spec Grav, UA: 1.015
Urobilinogen, UA: 0.2
pH, UA: 7

## 2014-07-28 LAB — VITAMIN B12: Vitamin B-12: 245 pg/mL (ref 211–911)

## 2014-07-28 LAB — TSH: TSH: 1.12 u[IU]/mL (ref 0.35–4.50)

## 2014-07-28 NOTE — Progress Notes (Signed)
Pre visit review using our clinic review tool, if applicable. No additional management support is needed unless otherwise documented below in the visit note. 

## 2014-07-28 NOTE — Progress Notes (Signed)
   Subjective:    Patient ID: Christine Middleton, female    DOB: 01/06/1923, 80 y.o.   MRN: [id number]  HPI 80 yr old female for a cpx. She is with her husband today. He has advanced dementia so taking care of him occupies most of her time. She seems to be doing well however and she has no complaints today.    Review of Systems  Constitutional: Negative.   HENT: Negative.   Eyes: Negative.   Respiratory: Negative.   Cardiovascular: Negative.   Gastrointestinal: Negative.   Genitourinary: Negative for dysuria, urgency, frequency, hematuria, flank pain, decreased urine volume, enuresis, difficulty urinating, pelvic pain and dyspareunia.  Musculoskeletal: Negative.   Skin: Negative.   Neurological: Negative.   Psychiatric/Behavioral: Negative.        Objective:   Physical Exam  Constitutional: She is oriented to person, place, and time. She appears well-developed and well-nourished. No distress.  HENT:  Head: Normocephalic and atraumatic.  Right Ear: External ear normal.  Left Ear: External ear normal.  Nose: Nose normal.  Mouth/Throat: Oropharynx is clear and moist. No oropharyngeal exudate.  Eyes: Conjunctivae and EOM are normal. Pupils are equal, round, and reactive to light. No scleral icterus.  Neck: Normal range of motion. Neck supple. No JVD present. No thyromegaly present.  Cardiovascular: Normal rate, regular rhythm, normal heart sounds and intact distal pulses.  Exam reveals no gallop and no friction rub.   No murmur heard. Pulmonary/Chest: Effort normal and breath sounds normal. No respiratory distress. She has no wheezes. She has no rales. She exhibits no tenderness.  Abdominal: Soft. Bowel sounds are normal. She exhibits no distension and no mass. There is no tenderness. There is no rebound and no guarding.  Musculoskeletal: Normal range of motion. She exhibits no edema or tenderness.  Lymphadenopathy:    She has no cervical adenopathy.  Neurological: She is alert and  oriented to person, place, and time. She has normal reflexes. No cranial nerve deficit. She exhibits normal muscle tone. Coordination normal.  Skin: Skin is warm and dry. No rash noted. No erythema.  Psychiatric: She has a normal mood and affect. Her behavior is normal. Judgment and thought content normal.          Assessment & Plan:  Well exam. Get fasting labs

## 2014-07-29 ENCOUNTER — Other Ambulatory Visit: Payer: Self-pay | Admitting: Family Medicine

## 2014-09-23 ENCOUNTER — Other Ambulatory Visit: Payer: Self-pay | Admitting: Family Medicine

## 2014-10-14 ENCOUNTER — Ambulatory Visit (INDEPENDENT_AMBULATORY_CARE_PROVIDER_SITE_OTHER): Payer: Medicare Other | Admitting: Family Medicine

## 2014-10-14 ENCOUNTER — Encounter: Payer: Self-pay | Admitting: Family Medicine

## 2014-10-14 ENCOUNTER — Ambulatory Visit (INDEPENDENT_AMBULATORY_CARE_PROVIDER_SITE_OTHER)
Admission: RE | Admit: 2014-10-14 | Discharge: 2014-10-14 | Disposition: A | Discharge status: Home / Self Care | Payer: Medicare Other | Source: Ambulatory Visit | Attending: Family Medicine | Admitting: Family Medicine

## 2014-10-14 VITALS — BP 148/64 | HR 69 | Temp 98.8°F | Ht 61.5 in | Wt 145.0 lb

## 2014-10-14 DIAGNOSIS — K5909 Other constipation: Secondary | ICD-10-CM

## 2014-10-14 DIAGNOSIS — M545 Low back pain, unspecified: Secondary | ICD-10-CM

## 2014-10-14 MED ORDER — TRAMADOL HCL 50 MG PO TABS: 50.0000 mg | ORAL_TABLET | Freq: Four times a day (QID) | ORAL | Status: DC | PRN

## 2014-10-14 NOTE — Progress Notes (Signed)
Pre visit review using our clinic review tool, if applicable. No additional management support is needed unless otherwise documented below in the visit note. 

## 2014-10-14 NOTE — Progress Notes (Signed)
   Subjective:    Patient ID: Christine Middleton, female    DOB: 1922-08-27, 81 y.o.   MRN: [id number]  HPI Here for injuries she sustained on 10-09-14 when she fell backwards in her yard.. While planting tomatoes it sounds like she lost her balance and fell, landing hard on her back. No LOC. She has had sharp pains in the lower back ever since. She can walk but has trouble bending or twisting. Takes Aleve at bedtime but nothing for pain during the day. Also she has felt constipated for the past week, and she has not had a BM for 3 days. Her abdomen feels bloated but there is no pain.    Review of Systems  Constitutional: Negative.   Respiratory: Negative.   Cardiovascular: Negative.   Gastrointestinal: Positive for constipation and abdominal distention. Negative for nausea, vomiting, abdominal pain, diarrhea, blood in stool, anal bleeding and rectal pain.  Musculoskeletal: Positive for back pain.       Objective:   Physical Exam  Constitutional: She is oriented to person, place, and time. She appears well-developed and well-nourished.  In pain, gets on the exam table with assistance   Cardiovascular: Normal rate, regular rhythm, normal heart sounds and intact distal pulses.   Pulmonary/Chest: Effort normal and breath sounds normal.  Musculoskeletal:  Very tender over the lower spine and the sacrum. There is a large ecchymosis over the right ischium. Full ROM of the hips   Neurological: She is alert and oriented to person, place, and time.          Assessment & Plan:  She has extensive bruising over the pelvis and lower back but I am also worried about a possible vertebral compression fracture. Use Tramadol for pain. Get Xrays of the LS spine and pelvis today. Use Miralax and MOM prn for constipation.

## 2014-10-16 ENCOUNTER — Telehealth: Payer: Self-pay | Admitting: Family Medicine

## 2014-10-16 NOTE — Telephone Encounter (Signed)
Pt daughter called and said pt phone is out of order and you can call her with the results of the xray.   [name] [phone]

## 2014-10-19 NOTE — Telephone Encounter (Signed)
I spoke with pt and went over results. 

## 2014-11-28 ENCOUNTER — Other Ambulatory Visit: Payer: Self-pay | Admitting: Family Medicine

## 2014-12-20 ENCOUNTER — Other Ambulatory Visit: Payer: Self-pay | Admitting: Family Medicine

## 2014-12-21 NOTE — Telephone Encounter (Signed)
Sent to the pharmacy by e-scribe.  Filled for 6 months.

## 2015-01-21 ENCOUNTER — Other Ambulatory Visit: Payer: Self-pay | Admitting: Family Medicine

## 2015-01-25 ENCOUNTER — Other Ambulatory Visit: Payer: Self-pay

## 2015-02-04 ENCOUNTER — Other Ambulatory Visit: Payer: Self-pay | Admitting: Family Medicine

## 2015-06-02 ENCOUNTER — Telehealth: Payer: Self-pay | Admitting: Neurology

## 2015-06-02 MED ORDER — CLOPIDOGREL BISULFATE 75 MG PO TABS: 75.0000 mg | ORAL_TABLET | Freq: Every day | ORAL | Status: DC

## 2015-06-02 NOTE — Telephone Encounter (Signed)
Rx has been provided.  

## 2015-06-02 NOTE — Telephone Encounter (Signed)
Christine Middleton with [facility] [address] calling requesting refill forclopidogrel (PLAVIX) 75 MG tablet . She can be reached at  [phone]

## 2015-06-03 ENCOUNTER — Other Ambulatory Visit: Payer: Self-pay | Admitting: Family Medicine

## 2015-06-08 ENCOUNTER — Other Ambulatory Visit: Payer: Self-pay | Admitting: Family Medicine

## 2015-06-17 ENCOUNTER — Other Ambulatory Visit: Payer: Self-pay | Admitting: Internal Medicine

## 2015-06-29 ENCOUNTER — Other Ambulatory Visit: Payer: Self-pay | Admitting: Neurology

## 2015-06-30 ENCOUNTER — Other Ambulatory Visit: Payer: Self-pay

## 2015-07-07 ENCOUNTER — Other Ambulatory Visit: Payer: Self-pay | Admitting: Neurology

## 2015-07-08 ENCOUNTER — Other Ambulatory Visit: Payer: Self-pay

## 2015-07-08 ENCOUNTER — Other Ambulatory Visit: Payer: Self-pay | Admitting: Neurology

## 2015-07-08 NOTE — Telephone Encounter (Signed)
Pharmacy requested refills on plavix. Pt was last seen 04/2014. Pt was given a one time refill in November 2016 for 30 days and message was sent for her to schedule an appt.Medication denied for refill. No new appts have been schedule with patient.

## 2015-07-12 ENCOUNTER — Telehealth: Payer: Self-pay | Admitting: Neurology

## 2015-07-12 NOTE — Telephone Encounter (Signed)
Rn call patient about needing a refill for plavix. Rn explain to patient she was last seen 04/2014 for a follow up visit. Rn also explain that its very important that she is evaluated 6 months to a year for blood thinners. Pt stated she has been doing well, and does not want to have another stroke. Pt stated she was unaware of having to be seen for plavix. Rn stated she can be evaluated by one our our NP for a visit. Pt stated she can come in tomorrow at 0930 for her appt. Rn verify with patient to check in at 0915am. Pt stated she will be there. Rn explain her plavix can be refill base on the provider evaluation.

## 2015-07-12 NOTE — Telephone Encounter (Signed)
Pt called and needs refill on  clopidogrel (PLAVIX) 75 MG tablet. She has an appt March 3rd , which was the first avail.

## 2015-07-13 ENCOUNTER — Encounter: Payer: Self-pay | Admitting: Adult Health

## 2015-07-13 ENCOUNTER — Ambulatory Visit (INDEPENDENT_AMBULATORY_CARE_PROVIDER_SITE_OTHER): Payer: Medicare Other | Admitting: Adult Health

## 2015-07-13 VITALS — BP 158/76 | HR 71 | Ht 61.5 in | Wt 142.5 lb

## 2015-07-13 DIAGNOSIS — Z8673 Personal history of transient ischemic attack (TIA), and cerebral infarction without residual deficits: Secondary | ICD-10-CM

## 2015-07-13 MED ORDER — CLOPIDOGREL BISULFATE 75 MG PO TABS: 75.0000 mg | ORAL_TABLET | Freq: Every day | ORAL | Status: DC

## 2015-07-13 NOTE — Progress Notes (Signed)
I reviewed above note and agree with the assessment and plan.  Rosalin Hawking, MD PhD Stroke Neurology 07/13/2015 10:16 AM

## 2015-07-13 NOTE — Patient Instructions (Addendum)
plavix 75 mg orally every day for secondary stroke prevention and maintain strict control of hypertension with blood pressure goal below 140/90, and lipids with LDL cholesterol goal below 100 mg/dL. If your symptoms worsen or you develop new symptoms please let us know.

## 2015-07-13 NOTE — Progress Notes (Signed)
PATIENT: Christine Middleton DOB: 1923/07/28  REASON FOR VISIT: follow up- stroke HISTORY FROM: patient  HISTORY OF PRESENT ILLNESS: Christine Middleton is a 81 year old female with a history of stroke. She returns today for follow-up. She continues to take Plavix and tolerates it well. Denies any significant bruising or bleeding. She has regular follow-ups with her primary care provider. Her next physical is scheduled for January 5. Her primary care manages her hypertension and hyperlipidemia. She states that after her stroke she was not left with any residual symptoms. She's not had any recurrent stroke like symptoms. Overall she feels that she is doing very well. Her pressure is slightly elevated today. She states that she missed her turn for our building and feels that that may have caused her blood pressure to increase a little. She denies any new neurological symptoms. She returns today for an evaluation.  HISTORY 05/27/14:  Christine Middleton is a 81 y.o. female with history of hypertension, hyperlipidemia, previous stroke and vitamin B12 deficiency, presenting with acute onset of left facial weakness and slurred speech as well as gaze deviation to the right side on 10/04/2013. No extremity weakness was noted. Was on Plavix 75 mg per day. 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 NIH stroke score was 7. Patient was deemed a candidate for intravenous thrombolytic therapy with TPA which was administered. MRI Imaging confirmed a small right frontal lobe infarct. Infarct felt to be thrombotic secondary to small vessel disease. Plavix to be continued for secondary stroke prevention (did consider Aggrenox, but given side effect of headache it was opted against). Vascular risk factors of HTN, Hyperlipidemia, Hx stroke, family hx stroke. Patient with no continued stroke symptoms. Physical therapy, occupational therapy and speech therapy evaluated patient. They recommend no follow up  therapy needs. Since hospital discharge she has been well. She has had no recurrent stroke or TIA symptoms. Blood pressure is well controlled it is 126/72 in office today. She is tolerating Plavix well with no signs of significant bleeding or bruising. She has regularly schedule annual exam with pcp Dr. Sarajane Jews soon and will have labs done there. She drives some short distances but not on major highways. She is caring for her husband of 64 years who has moderate Alzheimer's dementia.    REVIEW OF SYSTEMS: Out of a complete 14 system review of symptoms, the patient complains only of the following symptoms, and all other reviewed systems are negative.  See history of present illness  ALLERGIES: Allergies  Allergen Reactions  . Amoxicillin     REACTION: rash  . Codeine   . Lisinopril   . Nitrofurantoin     HOME MEDICATIONS: Outpatient Prescriptions Prior to Visit  Medication Sig Dispense Refill  . atenolol (TENORMIN) 100 MG tablet TAKE 1 TABLET (100 MG TOTAL) BY MOUTH DAILY. 90 tablet 3  . clopidogrel (PLAVIX) 75 MG tablet Take 1 tablet (75 mg total) by mouth daily with breakfast. 30 tablet 0  . hydrochlorothiazide (HYDRODIURIL) 25 MG tablet TAKE 1 TABLET (25 MG TOTAL) BY MOUTH DAILY. 90 tablet 1  . losartan (COZAAR) 100 MG tablet Take 100 mg by mouth daily.    . pantoprazole (PROTONIX) 40 MG tablet TAKE 1 TABLET BY MOUTH EVERY DAY 90 tablet 1  . simvastatin (ZOCOR) 20 MG tablet TAKE 1 TABLET (20 MG TOTAL) BY MOUTH AT BEDTIME. 90 tablet 1  . traMADol (ULTRAM) 50 MG tablet Take 1 tablet (50 mg total)  by mouth every 6 (six) hours as needed for moderate pain. 60 tablet 0  . losartan (COZAAR) 100 MG tablet TAKE 1 TABLET EVERY DAY 90 tablet 1  . potassium gluconate 595 MG TABS tablet Take 595 mg by mouth daily.     No facility-administered medications prior to visit.    PAST MEDICAL HISTORY: Past Medical History  Diagnosis Date  . Hyperlipidemia   . GERD (gastroesophageal reflux disease)     . Hypertension   . Shingles   . Diverticulitis   . Esophageal stricture   . Vitamin B 12 deficiency   . Stroke [facility])     PAST SURGICAL HISTORY: Past Surgical History  Procedure Laterality Date  . Appendectomy    . Abdominal hysterectomy    . Eye surgery    . Tonsillectomy    . Colonoscopy  12-09-08    per Dr. Sharlett Iles (incomplete along with barium enema 12-10-08) with hemorrhoids  and diverticulae  . Esophagogastroduodenoscopy  09-2004    per Dr. Sharlett Iles with dilatation     FAMILY HISTORY: Family History  Problem Relation Age of Onset  . Breast cancer    . Diabetes    . Hypertension    . Stroke    . Heart disease    . Heart attack Mother   . Heart disease Father   . Stroke Sister   . Cancer Brother   . Cancer Brother   . Diabetes Brother     SOCIAL HISTORY: Social History   Social History  . Marital Status: Married    Spouse Name: Grayling Congress. Betke  . Number of Children: 4  . Years of Education: HS   Occupational History  . Retired    Social History Main Topics  . Smoking status: Never Smoker   . Smokeless tobacco: Never Used  . Alcohol Use: No  . Drug Use: No  . Sexual Activity: Not on file   Other Topics Concern  . Not on file   Social History Narrative   Patient lives at home with family.   Patient is right handed.   Patient has a high school education.   Caffeine Use: 1 cup daily      PHYSICAL EXAM  Filed Vitals:   07/13/15 0927  BP: 158/76  Pulse: 71  Height: 5' 1.5" (1.562 m)  Weight: 142 lb 8 oz (64.638 kg)   Body mass index is 26.49 kg/(m^2).  Generalized: Well developed, in no acute distress   Neurological examination  Mentation: Alert oriented to time, place, history taking. Follows all commands speech and language fluent Cranial nerve II-XII: Pupils were equal round reactive to light. Extraocular movements were full, visual field were full on confrontational test. Facial sensation and strength were normal. Uvula tongue  midline. Head turning and shoulder shrug  were normal and symmetric. Motor: The motor testing reveals 5 over 5 strength of all 4 extremities. Good symmetric motor tone is noted throughout.  Sensory: Sensory testing is intact to soft touch on all 4 extremities. No evidence of extinction is noted.  Coordination: Cerebellar testing reveals good finger-nose-finger and heel-to-shin bilaterally.  Gait and station: Gait is normal. Tandem gait is unsteady. Romberg is negative. No drift is seen.  Reflexes: Deep tendon reflexes are symmetric and normal bilaterally.   DIAGNOSTIC DATA (LABS, IMAGING, TESTING) - I reviewed patient records, labs, notes, testing and imaging myself where available.  Lab Results  Component Value Date   WBC 7.6 07/28/2014   HGB 13.2 07/28/2014  HCT 39.8 07/28/2014   MCV 92.4 07/28/2014   PLT 221.0 07/28/2014      Component Value Date/Time   NA 142 07/28/2014 1122   K 4.3 07/28/2014 1122   CL 105 07/28/2014 1122   CO2 30 07/28/2014 1122   GLUCOSE 109* 07/28/2014 1122   GLUCOSE 104* 05/28/2006 1104   BUN 18 07/28/2014 1122   CREATININE 1.1 07/28/2014 1122   CALCIUM 9.4 07/28/2014 1122   PROT 7.5 07/28/2014 1122   ALBUMIN 4.0 07/28/2014 1122   AST 24 07/28/2014 1122   ALT 17 07/28/2014 1122   ALKPHOS 84 07/28/2014 1122   BILITOT 1.0 07/28/2014 1122   GFRNONAA 56* 10/04/2013 0818   GFRAA 65* 10/04/2013 0818   Lab Results  Component Value Date   CHOL 203* 07/28/2014   HDL 80.90 07/28/2014   LDLCALC 99 07/28/2014   LDLDIRECT 113.3 07/18/2012   TRIG 114.0 07/28/2014   CHOLHDL 3 07/28/2014   Lab Results  Component Value Date   HGBA1C 6.1 07/28/2014   Lab Results  Component Value Date   VITAMINB12 245 07/28/2014   Lab Results  Component Value Date   TSH 1.12 07/28/2014      ASSESSMENT AND PLAN 81 y.o. year old female  has a past medical history of Hyperlipidemia; GERD (gastroesophageal reflux disease); Hypertension; Shingles; Diverticulitis;  Esophageal stricture; Vitamin B 12 deficiency; and Stroke ([place]). here with:  1. History of stroke  Overall the patient is doing well. She will continue on Plavix 75 mg orally every day for secondary stroke prevention. A new perception was sent in today. She should maintain strict control of hypertension with blood pressure goal below 140/90, and lipids with LDL cholesterol goal below 100 mg/dL. Patient advised that if her primary care would like to take over Plavix she can follow-up with [place] on an as-needed basis. Patient is amenable to this plan.  Ward Givens, MSN, NP-C 07/13/2015, 9:47 AM [facility] [address] [place], [place] [address] [phone]

## 2015-07-16 ENCOUNTER — Other Ambulatory Visit: Payer: Self-pay | Admitting: Family Medicine

## 2015-08-03 ENCOUNTER — Encounter: Payer: Medicare Other | Admitting: Family Medicine

## 2015-08-05 ENCOUNTER — Ambulatory Visit (INDEPENDENT_AMBULATORY_CARE_PROVIDER_SITE_OTHER): Payer: Medicare Other | Admitting: Family Medicine

## 2015-08-05 ENCOUNTER — Encounter: Payer: Self-pay | Admitting: Family Medicine

## 2015-08-05 VITALS — BP 166/78 | HR 75 | Temp 97.7°F | Ht 61.5 in | Wt 140.0 lb

## 2015-08-05 DIAGNOSIS — Z23 Encounter for immunization: Secondary | ICD-10-CM | POA: Diagnosis not present

## 2015-08-05 DIAGNOSIS — Z0000 Encounter for general adult medical examination without abnormal findings: Secondary | ICD-10-CM | POA: Diagnosis not present

## 2015-08-05 DIAGNOSIS — E785 Hyperlipidemia, unspecified: Secondary | ICD-10-CM

## 2015-08-05 DIAGNOSIS — I10 Essential (primary) hypertension: Secondary | ICD-10-CM | POA: Diagnosis not present

## 2015-08-05 LAB — CBC WITH DIFFERENTIAL/PLATELET
Basophils Absolute: 0.1 10*3/uL (ref 0.0–0.1)
Basophils Relative: 0.7 % (ref 0.0–3.0)
Eosinophils Absolute: 0.3 10*3/uL (ref 0.0–0.7)
Eosinophils Relative: 3.6 % (ref 0.0–5.0)
HCT: 39.3 % (ref 36.0–46.0)
Hemoglobin: 13.1 g/dL (ref 12.0–15.0)
Lymphocytes Relative: 21.5 % (ref 12.0–46.0)
Lymphs Abs: 1.7 10*3/uL (ref 0.7–4.0)
MCHC: 33.3 g/dL (ref 30.0–36.0)
MCV: 93.1 fl (ref 78.0–100.0)
Monocytes Absolute: 0.6 10*3/uL (ref 0.1–1.0)
Monocytes Relative: 8 % (ref 3.0–12.0)
Neutro Abs: 5.1 10*3/uL (ref 1.4–7.7)
Neutrophils Relative %: 66.2 % (ref 43.0–77.0)
Platelets: 250 10*3/uL (ref 150.0–400.0)
RBC: 4.22 Mil/uL (ref 3.87–5.11)
RDW: 13.5 % (ref 11.5–15.5)
WBC: 7.7 10*3/uL (ref 4.0–10.5)

## 2015-08-05 LAB — LIPID PANEL
Cholesterol: 197 mg/dL (ref 0–200)
HDL: 87.2 mg/dL (ref >39.00)
LDL Cholesterol: 89 mg/dL (ref 0–99)
NonHDL: 110.01
Total CHOL/HDL Ratio: 2
Triglycerides: 106 mg/dL (ref 0.0–149.0)
VLDL: 21.2 mg/dL (ref 0.0–40.0)

## 2015-08-05 LAB — POCT URINALYSIS DIPSTICK
Bilirubin, UA: NEGATIVE
Glucose, UA: NEGATIVE
Ketones, UA: NEGATIVE
Nitrite, UA: NEGATIVE
Protein, UA: NEGATIVE
Spec Grav, UA: 1.025
Urobilinogen, UA: 0.2
pH, UA: 5

## 2015-08-05 LAB — HEPATIC FUNCTION PANEL
ALT: 17 U/L (ref 0–35)
AST: 22 U/L (ref 0–37)
Albumin: 4.3 g/dL (ref 3.5–5.2)
Alkaline Phosphatase: 79 U/L (ref 39–117)
Bilirubin, Direct: 0.2 mg/dL (ref 0.0–0.3)
Total Bilirubin: 1.1 mg/dL (ref 0.2–1.2)
Total Protein: 7 g/dL (ref 6.0–8.3)

## 2015-08-05 LAB — TSH: TSH: 0.84 u[IU]/mL (ref 0.35–4.50)

## 2015-08-05 LAB — BASIC METABOLIC PANEL
BUN: 16 mg/dL (ref 6–23)
CO2: 31 mEq/L (ref 19–32)
Calcium: 9.8 mg/dL (ref 8.4–10.5)
Chloride: 102 mEq/L (ref 96–112)
Creatinine, Ser: 1 mg/dL (ref 0.40–1.20)
GFR: 55.24 mL/min — ABNORMAL LOW (ref >60.00)
Glucose, Bld: 110 mg/dL — ABNORMAL HIGH (ref 70–99)
Potassium: 3.9 mEq/L (ref 3.5–5.1)
Sodium: 143 mEq/L (ref 135–145)

## 2015-08-05 MED ORDER — TRAMADOL HCL 50 MG PO TABS: 50.0000 mg | ORAL_TABLET | Freq: Four times a day (QID) | ORAL | Status: DC | PRN

## 2015-08-05 MED ORDER — CLOPIDOGREL BISULFATE 75 MG PO TABS: 75.0000 mg | ORAL_TABLET | Freq: Every day | ORAL | Status: DC

## 2015-08-05 NOTE — Progress Notes (Signed)
Pre visit review using our clinic review tool, if applicable. No additional management support is needed unless otherwise documented below in the visit note. 

## 2015-08-05 NOTE — Progress Notes (Signed)
   Subjective:    Patient ID: Christine Middleton, female    DOB: 27-Oct-1922, 82 y.o.   MRN: [id number]  HPI 82 yr old female for a well exam. She has been doing well. She is under a lot of stress due to caring for her demented husband.    Review of Systems  Constitutional: Negative.   HENT: Negative.   Eyes: Negative.   Respiratory: Negative.   Cardiovascular: Negative.   Gastrointestinal: Negative.   Genitourinary: Negative for dysuria, urgency, frequency, hematuria, flank pain, decreased urine volume, enuresis, difficulty urinating, pelvic pain and dyspareunia.  Musculoskeletal: Negative.   Skin: Negative.   Neurological: Negative.   Psychiatric/Behavioral: Negative.        Objective:   Physical Exam  Constitutional: She is oriented to person, place, and time. She appears well-developed and well-nourished. No distress.  HENT:  Head: Normocephalic and atraumatic.  Right Ear: External ear normal.  Left Ear: External ear normal.  Nose: Nose normal.  Mouth/Throat: Oropharynx is clear and moist. No oropharyngeal exudate.  Eyes: Conjunctivae and EOM are normal. Pupils are equal, round, and reactive to light. No scleral icterus.  Neck: Normal range of motion. Neck supple. No JVD present. No thyromegaly present.  Cardiovascular: Normal rate, regular rhythm, normal heart sounds and intact distal pulses.  Exam reveals no gallop and no friction rub.   No murmur heard. EKG shows stable LAFB   Pulmonary/Chest: Effort normal and breath sounds normal. No respiratory distress. She has no wheezes. She has no rales. She exhibits no tenderness.  Abdominal: Soft. Bowel sounds are normal. She exhibits no distension and no mass. There is no tenderness. There is no rebound and no guarding.  Musculoskeletal: Normal range of motion. She exhibits no edema or tenderness.  Lymphadenopathy:    She has no cervical adenopathy.  Neurological: She is alert and oriented to person, place, and time. She has normal  reflexes. No cranial nerve deficit. She exhibits normal muscle tone. Coordination normal.  Skin: Skin is warm and dry. No rash noted. No erythema.  Psychiatric: She has a normal mood and affect. Her behavior is normal. Judgment and thought content normal.          Assessment & Plan:  Well exam. We discussed diet and exercise. Get fasting labs today.

## 2015-08-05 NOTE — Addendum Note (Signed)
Addended by: Aggie Hacker A on: 08/05/2015 12:12 PM   Modules accepted: Orders

## 2015-09-01 DIAGNOSIS — Z961 Presence of intraocular lens: Secondary | ICD-10-CM | POA: Diagnosis not present

## 2015-09-01 DIAGNOSIS — D3131 Benign neoplasm of right choroid: Secondary | ICD-10-CM | POA: Diagnosis not present

## 2015-09-01 DIAGNOSIS — H04123 Dry eye syndrome of bilateral lacrimal glands: Secondary | ICD-10-CM | POA: Diagnosis not present

## 2015-10-01 ENCOUNTER — Ambulatory Visit: Payer: Self-pay | Admitting: Neurology

## 2015-11-25 ENCOUNTER — Other Ambulatory Visit: Payer: Self-pay | Admitting: Family Medicine

## 2015-12-10 ENCOUNTER — Other Ambulatory Visit: Payer: Self-pay | Admitting: Family Medicine

## 2016-01-08 ENCOUNTER — Other Ambulatory Visit: Payer: Self-pay | Admitting: Family Medicine

## 2016-01-25 ENCOUNTER — Other Ambulatory Visit: Payer: Self-pay | Admitting: Family Medicine

## 2016-05-23 ENCOUNTER — Other Ambulatory Visit: Payer: Self-pay | Admitting: Family Medicine

## 2016-07-02 ENCOUNTER — Other Ambulatory Visit: Payer: Self-pay | Admitting: Family Medicine

## 2016-07-17 ENCOUNTER — Other Ambulatory Visit: Payer: Self-pay | Admitting: Family Medicine

## 2016-07-17 NOTE — Telephone Encounter (Signed)
Can we refill this? 

## 2016-08-07 ENCOUNTER — Encounter: Payer: Medicare Other | Admitting: Family Medicine

## 2016-08-18 ENCOUNTER — Emergency Department (HOSPITAL_COMMUNITY): Payer: Medicare Other

## 2016-08-18 ENCOUNTER — Observation Stay (HOSPITAL_COMMUNITY): Payer: Medicare Other

## 2016-08-18 ENCOUNTER — Encounter (HOSPITAL_COMMUNITY): Payer: Self-pay | Admitting: Neurosurgery

## 2016-08-18 ENCOUNTER — Inpatient Hospital Stay (HOSPITAL_COMMUNITY)
Admission: EM | Admit: 2016-08-18 | Discharge: 2016-08-22 | DRG: 066 | Disposition: A | Discharge status: Home Health | Payer: Medicare Other | Attending: Internal Medicine | Admitting: Internal Medicine

## 2016-08-18 DIAGNOSIS — F411 Generalized anxiety disorder: Secondary | ICD-10-CM | POA: Diagnosis present

## 2016-08-18 DIAGNOSIS — Z881 Allergy status to other antibiotic agents status: Secondary | ICD-10-CM

## 2016-08-18 DIAGNOSIS — I6340 Cerebral infarction due to embolism of unspecified cerebral artery: Principal | ICD-10-CM | POA: Diagnosis present

## 2016-08-18 DIAGNOSIS — I6310 Cerebral infarction due to embolism of unspecified precerebral artery: Secondary | ICD-10-CM | POA: Diagnosis not present

## 2016-08-18 DIAGNOSIS — R29701 NIHSS score 1: Secondary | ICD-10-CM | POA: Diagnosis not present

## 2016-08-18 DIAGNOSIS — R29810 Facial weakness: Secondary | ICD-10-CM | POA: Diagnosis present

## 2016-08-18 DIAGNOSIS — I4891 Unspecified atrial fibrillation: Secondary | ICD-10-CM | POA: Diagnosis not present

## 2016-08-18 DIAGNOSIS — Z8673 Personal history of transient ischemic attack (TIA), and cerebral infarction without residual deficits: Secondary | ICD-10-CM

## 2016-08-18 DIAGNOSIS — R2990 Unspecified symptoms and signs involving the nervous system: Secondary | ICD-10-CM

## 2016-08-18 DIAGNOSIS — K5900 Constipation, unspecified: Secondary | ICD-10-CM | POA: Diagnosis present

## 2016-08-18 DIAGNOSIS — Z7902 Long term (current) use of antithrombotics/antiplatelets: Secondary | ICD-10-CM

## 2016-08-18 DIAGNOSIS — I10 Essential (primary) hypertension: Secondary | ICD-10-CM | POA: Diagnosis present

## 2016-08-18 DIAGNOSIS — Z79891 Long term (current) use of opiate analgesic: Secondary | ICD-10-CM

## 2016-08-18 DIAGNOSIS — E785 Hyperlipidemia, unspecified: Secondary | ICD-10-CM | POA: Diagnosis not present

## 2016-08-18 DIAGNOSIS — Z888 Allergy status to other drugs, medicaments and biological substances status: Secondary | ICD-10-CM

## 2016-08-18 DIAGNOSIS — I6789 Other cerebrovascular disease: Secondary | ICD-10-CM

## 2016-08-18 DIAGNOSIS — I6601 Occlusion and stenosis of right middle cerebral artery: Secondary | ICD-10-CM | POA: Diagnosis not present

## 2016-08-18 DIAGNOSIS — E538 Deficiency of other specified B group vitamins: Secondary | ICD-10-CM | POA: Diagnosis present

## 2016-08-18 DIAGNOSIS — R569 Unspecified convulsions: Secondary | ICD-10-CM | POA: Diagnosis present

## 2016-08-18 DIAGNOSIS — I639 Cerebral infarction, unspecified: Secondary | ICD-10-CM | POA: Diagnosis present

## 2016-08-18 DIAGNOSIS — Z885 Allergy status to narcotic agent status: Secondary | ICD-10-CM

## 2016-08-18 DIAGNOSIS — Z66 Do not resuscitate: Secondary | ICD-10-CM | POA: Diagnosis present

## 2016-08-18 DIAGNOSIS — Z79899 Other long term (current) drug therapy: Secondary | ICD-10-CM

## 2016-08-18 DIAGNOSIS — Z8249 Family history of ischemic heart disease and other diseases of the circulatory system: Secondary | ICD-10-CM

## 2016-08-18 DIAGNOSIS — I63439 Cerebral infarction due to embolism of unspecified posterior cerebral artery: Secondary | ICD-10-CM | POA: Diagnosis not present

## 2016-08-18 DIAGNOSIS — K219 Gastro-esophageal reflux disease without esophagitis: Secondary | ICD-10-CM | POA: Diagnosis present

## 2016-08-18 DIAGNOSIS — I6622 Occlusion and stenosis of left posterior cerebral artery: Secondary | ICD-10-CM | POA: Diagnosis not present

## 2016-08-18 DIAGNOSIS — Z880 Allergy status to penicillin: Secondary | ICD-10-CM

## 2016-08-18 DIAGNOSIS — R29818 Other symptoms and signs involving the nervous system: Secondary | ICD-10-CM | POA: Diagnosis not present

## 2016-08-18 DIAGNOSIS — R471 Dysarthria and anarthria: Secondary | ICD-10-CM | POA: Diagnosis present

## 2016-08-18 DIAGNOSIS — Z823 Family history of stroke: Secondary | ICD-10-CM

## 2016-08-18 DIAGNOSIS — M353 Polymyalgia rheumatica: Secondary | ICD-10-CM | POA: Diagnosis present

## 2016-08-18 LAB — COMPREHENSIVE METABOLIC PANEL
ALT: 12 U/L — ABNORMAL LOW (ref 14–54)
AST: 22 U/L (ref 15–41)
Albumin: 3.6 g/dL (ref 3.5–5.0)
Alkaline Phosphatase: 84 U/L (ref 38–126)
Anion gap: 11 (ref 5–15)
BUN: 19 mg/dL (ref 6–20)
CO2: 24 mmol/L (ref 22–32)
Calcium: 9 mg/dL (ref 8.9–10.3)
Chloride: 105 mmol/L (ref 101–111)
Creatinine, Ser: 0.99 mg/dL (ref 0.44–1.00)
GFR calc Af Amer: 56 mL/min — ABNORMAL LOW (ref >60)
GFR calc non Af Amer: 48 mL/min — ABNORMAL LOW (ref >60)
Glucose, Bld: 105 mg/dL — ABNORMAL HIGH (ref 65–99)
Potassium: 3.7 mmol/L (ref 3.5–5.1)
Sodium: 140 mmol/L (ref 135–145)
Total Bilirubin: 0.8 mg/dL (ref 0.3–1.2)
Total Protein: 6.3 g/dL — ABNORMAL LOW (ref 6.5–8.1)

## 2016-08-18 LAB — CBC
HCT: 35.7 % — ABNORMAL LOW (ref 36.0–46.0)
Hemoglobin: 12.1 g/dL (ref 12.0–15.0)
MCH: 30.8 pg (ref 26.0–34.0)
MCHC: 33.9 g/dL (ref 30.0–36.0)
MCV: 90.8 fL (ref 78.0–100.0)
Platelets: 206 10*3/uL (ref 150–400)
RBC: 3.93 MIL/uL (ref 3.87–5.11)
RDW: 13.1 % (ref 11.5–15.5)
WBC: 7.2 10*3/uL (ref 4.0–10.5)

## 2016-08-18 LAB — VAS US CAROTID
LEFT ECA DIAS: -11 cm/s
LEFT VERTEBRAL DIAS: 8 cm/s
Left CCA dist dias: -16 cm/s
Left CCA dist sys: -88 cm/s
Left CCA prox dias: 20 cm/s
Left CCA prox sys: 133 cm/s
Left ICA dist dias: -22 cm/s
Left ICA dist sys: -85 cm/s
Left ICA prox dias: -18 cm/s
Left ICA prox sys: -79 cm/s
RIGHT ECA DIAS: 9 cm/s
RIGHT VERTEBRAL DIAS: 6 cm/s
Right CCA prox dias: -12 cm/s
Right CCA prox sys: -86 cm/s
Right cca dist sys: -94 cm/s

## 2016-08-18 LAB — DIFFERENTIAL
Basophils Absolute: 0.1 10*3/uL (ref 0.0–0.1)
Basophils Relative: 1 %
Eosinophils Absolute: 0.3 10*3/uL (ref 0.0–0.7)
Eosinophils Relative: 4 %
Lymphocytes Relative: 22 %
Lymphs Abs: 1.6 10*3/uL (ref 0.7–4.0)
Monocytes Absolute: 0.5 10*3/uL (ref 0.1–1.0)
Monocytes Relative: 7 %
Neutro Abs: 4.8 10*3/uL (ref 1.7–7.7)
Neutrophils Relative %: 66 %

## 2016-08-18 LAB — I-STAT TROPONIN, ED: Troponin i, poc: 0 ng/mL (ref 0.00–0.08)

## 2016-08-18 LAB — I-STAT CHEM 8, ED
BUN: 20 mg/dL (ref 6–20)
Calcium, Ion: 1.01 mmol/L — ABNORMAL LOW (ref 1.15–1.40)
Chloride: 105 mmol/L (ref 101–111)
Creatinine, Ser: 0.9 mg/dL (ref 0.44–1.00)
Glucose, Bld: 112 mg/dL — ABNORMAL HIGH (ref 65–99)
HCT: 34 % — ABNORMAL LOW (ref 36.0–46.0)
Hemoglobin: 11.6 g/dL — ABNORMAL LOW (ref 12.0–15.0)
Potassium: 3.5 mmol/L (ref 3.5–5.1)
Sodium: 140 mmol/L (ref 135–145)
TCO2: 24 mmol/L (ref 0–100)

## 2016-08-18 LAB — PROTIME-INR
INR: 1.04
Prothrombin Time: 13.6 seconds (ref 11.4–15.2)

## 2016-08-18 LAB — ECHOCARDIOGRAM COMPLETE
Height: 63 in
Weight: 2328.06 oz

## 2016-08-18 LAB — APTT: aPTT: 28 seconds (ref 24–36)

## 2016-08-18 MED ORDER — HYDRALAZINE HCL 20 MG/ML IJ SOLN: 10.0000 mg | Freq: Three times a day (TID) | INTRAMUSCULAR | Status: DC | PRN

## 2016-08-18 MED ORDER — SODIUM CHLORIDE 0.9 % IV SOLN
INTRAVENOUS | Status: AC
  Administered 2016-08-18 – 2016-08-19 (×3): via INTRAVENOUS

## 2016-08-18 MED ORDER — CLONAZEPAM 0.5 MG PO TABS: 0.2500 mg | ORAL_TABLET | Freq: Two times a day (BID) | ORAL | Status: DC | PRN

## 2016-08-18 MED ORDER — ENOXAPARIN SODIUM 40 MG/0.4ML ~~LOC~~ SOLN
40.0000 mg | SUBCUTANEOUS | Status: DC
  Administered 2016-08-18 – 2016-08-22 (×5): 40 mg via SUBCUTANEOUS
  Filled 2016-08-18 (×5): qty 0.4

## 2016-08-18 MED ORDER — CLOPIDOGREL BISULFATE 75 MG PO TABS
75.0000 mg | ORAL_TABLET | Freq: Every day | ORAL | Status: DC
  Administered 2016-08-19 – 2016-08-22 (×4): 75 mg via ORAL
  Filled 2016-08-18 (×4): qty 1

## 2016-08-18 MED ORDER — STROKE: EARLY STAGES OF RECOVERY BOOK
Freq: Once | Status: DC
  Filled 2016-08-18: qty 1

## 2016-08-18 MED ORDER — ATENOLOL 100 MG PO TABS
100.0000 mg | ORAL_TABLET | Freq: Every day | ORAL | Status: DC
  Filled 2016-08-18: qty 1

## 2016-08-18 MED ORDER — ACETAMINOPHEN 325 MG PO TABS: 650.0000 mg | ORAL_TABLET | ORAL | Status: DC | PRN

## 2016-08-18 MED ORDER — ACETAMINOPHEN 650 MG RE SUPP: 650.0000 mg | RECTAL | Status: DC | PRN

## 2016-08-18 MED ORDER — SIMVASTATIN 40 MG PO TABS
40.0000 mg | ORAL_TABLET | Freq: Every day | ORAL | Status: DC
  Administered 2016-08-19 – 2016-08-21 (×3): 40 mg via ORAL
  Filled 2016-08-18 (×3): qty 1

## 2016-08-18 MED ORDER — PANTOPRAZOLE SODIUM 40 MG PO TBEC
40.0000 mg | DELAYED_RELEASE_TABLET | Freq: Every day | ORAL | Status: DC
Start: 2016-08-19 — End: 2016-08-22
  Administered 2016-08-19 – 2016-08-22 (×4): 40 mg via ORAL
  Filled 2016-08-18 (×4): qty 1

## 2016-08-18 MED ORDER — ASPIRIN 325 MG PO TABS
325.0000 mg | ORAL_TABLET | Freq: Every day | ORAL | Status: DC
  Administered 2016-08-18 – 2016-08-22 (×5): 325 mg via ORAL
  Filled 2016-08-18 (×5): qty 1

## 2016-08-18 MED ORDER — BISACODYL 10 MG RE SUPP: 10.0000 mg | Freq: Every day | RECTAL | Status: DC | PRN

## 2016-08-18 MED ORDER — ACETAMINOPHEN 160 MG/5ML PO SOLN: 650.0000 mg | ORAL | Status: DC | PRN

## 2016-08-18 MED ORDER — IOPAMIDOL (ISOVUE-370) INJECTION 76%
INTRAVENOUS | Status: AC
  Administered 2016-08-18: 50 mL
  Filled 2016-08-18: qty 100

## 2016-08-18 NOTE — ED Notes (Addendum)
Pt transported to MRI 

## 2016-08-18 NOTE — ED Notes (Signed)
Nero in at bedside, will obtain temp when nero doc leaves.

## 2016-08-18 NOTE — ED Notes (Addendum)
Patient transported to Ultrasound 

## 2016-08-18 NOTE — ED Notes (Signed)
Echo at bedside

## 2016-08-18 NOTE — H&P (Signed)
[facility] History and Physical  Christine Middleton [id number] DOB: 1922/08/09 DOA: 08/18/2016  Referring physician: PCP: [name], MD   Chief Complaint: "My son said my face was drooping again." - patient  HPI: Christine Middleton is a 83 y.o. female  patient with past medical history significant for stroke, diverticulitis, hypertension, shingles presented to the emergency room with left-sided facial droop and dysarthria. Patient states she has been her normal state of health for the last week. Patient states that this morning she woke up and felt "odd". Patient states he went downstairs for routine and her son noted that she had increased shoulder droop and unclear speech. EMS was activated and patient was brought to the emergency room for evaluation. Last known well time several hours so patient out of TPA window.  ED course: Code stroke originally called but by report was rescinded. Per neurology based on EDP no patient needs workup for recurrent stroke. Hospitalist called for evaluation and admission.   Review of Systems:  As per HPI otherwise 10 point review of systems negative.    Past Medical History:  Diagnosis Date  . Diverticulitis   . Esophageal stricture   . GERD (gastroesophageal reflux disease)   . Hyperlipidemia   . Hypertension   . Shingles   . Stroke ([place])   . Vitamin B 12 deficiency    Past Surgical History:  Procedure Laterality Date  . ABDOMINAL HYSTERECTOMY    . APPENDECTOMY    . COLONOSCOPY  12-09-08   per Dr. Sharlett Iles (incomplete along with barium enema 12-10-08) with hemorrhoids  and diverticulae  . ESOPHAGOGASTRODUODENOSCOPY  09-2004   per Dr. Sharlett Iles with dilatation   . EYE SURGERY    . TONSILLECTOMY     Social History:  reports that she has never smoked. She has never used smokeless tobacco. She reports that she does not drink alcohol or use drugs.  Allergies  Allergen Reactions  . Amoxicillin     REACTION: rash  . Codeine   . Lisinopril     . Nitrofurantoin     Family History  Problem Relation Age of Onset  . Heart attack Mother   . Heart disease Father   . Breast cancer    . Diabetes    . Hypertension    . Stroke    . Heart disease    . Stroke Sister   . Cancer Brother   . Cancer Brother   . Diabetes Brother      Prior to Admission medications   Medication Sig Start Date End Date Taking? Authorizing Provider  atenolol (TENORMIN) 100 MG tablet TAKE 1 TABLET (100 MG TOTAL) BY MOUTH DAILY. 01/25/16   Laurey Morale, MD  B Complex-C (SUPER B COMPLEX/VITAMIN C PO) Take 1 tablet by mouth daily.    Historical Provider, MD  clopidogrel (PLAVIX) 75 MG tablet TAKE 1 TABLET BY MOUTH DAILY WITH BREAKFAST 07/17/16   Laurey Morale, MD  hydrochlorothiazide (HYDRODIURIL) 25 MG tablet TAKE 1 TABLET (25 MG TOTAL) BY MOUTH DAILY. 12/10/15   Laurey Morale, MD  losartan (COZAAR) 100 MG tablet Take 100 mg by mouth daily.    Historical Provider, MD  losartan (COZAAR) 100 MG tablet TAKE 1 TABLET EVERY DAY 12/10/15   Laurey Morale, MD  pantoprazole (PROTONIX) 40 MG tablet TAKE 1 TABLET BY MOUTH EVERY DAY 07/04/16   Laurey Morale, MD  simvastatin (ZOCOR) 20 MG tablet TAKE 1 TABLET (20 MG TOTAL) BY MOUTH AT BEDTIME.  05/23/16   Laurey Morale, MD  traMADol (ULTRAM) 50 MG tablet Take 1 tablet (50 mg total) by mouth every 6 (six) hours as needed for moderate pain. 08/05/15   Laurey Morale, MD   Physical Exam: Vitals:   08/18/16 1000 08/18/16 1054 08/18/16 1059  BP:  157/71   Pulse:  68   Resp:  18   SpO2:  97%   Weight: 66 kg (145 lb 8.1 oz)  66 kg (145 lb 8.1 oz)  Height:   5\' 3"  (1.6 m)    Wt Readings from Last 3 Encounters:  08/18/16 66 kg (145 lb 8.1 oz)  08/05/15 63.5 kg (140 lb)  07/13/15 64.6 kg (142 lb 8 oz)    General:  Appears calm and comfortable, A&Ox3 Eyes:  PERRL, EOMI, normal lids, iris ENT:  grossly normal hearing, lips & tongue Neck:  no LAD, masses or thyromegaly Cardiovascular:  RRR, no m/r/g. No LE edema.   Respiratory:  CTA bilaterally, no w/r/r. Normal respiratory effort. Abdomen:  soft, ntnd Skin:  no rash or induration seen on limited exam Musculoskeletal:  grossly normal tone BUE/BLE Psychiatric:  grossly normal mood and affect, speech fluent and appropriate Neurologic:  CN 2-12 grossly intact, moves all extremities in coordinated fashion. Fluid speech. Mild facial droop.          Labs on Admission:  Basic Metabolic Panel:  Recent Labs Lab 08/18/16 1033 08/18/16 1038  NA 140 140  K 3.7 3.5  CL 105 105  CO2 24  --   GLUCOSE 105* 112*  BUN 19 20  CREATININE 0.99 0.90  CALCIUM 9.0  --    Liver Function Tests:  Recent Labs Lab 08/18/16 1033  AST 22  ALT 12*  ALKPHOS 84  BILITOT 0.8  PROT 6.3*  ALBUMIN 3.6   No results for input(s): LIPASE, AMYLASE in the last 168 hours. No results for input(s): AMMONIA in the last 168 hours. CBC:  Recent Labs Lab 08/18/16 1033 08/18/16 1038  WBC 7.2  --   NEUTROABS 4.8  --   HGB 12.1 11.6*  HCT 35.7* 34.0*  MCV 90.8  --   PLT 206  --    Cardiac Enzymes: No results for input(s): CKTOTAL, CKMB, CKMBINDEX, TROPONINI in the last 168 hours.  BNP (last 3 results) No results for input(s): BNP in the last 8760 hours.  ProBNP (last 3 results) No results for input(s): PROBNP in the last 8760 hours.   Serum creatinine: 0.9 mg/dL 08/18/16 1038 Estimated creatinine clearance: 36.4 mL/min  CBG: No results for input(s): GLUCAP in the last 168 hours.  Radiological Exams on Admission: Ct Head Code Stroke W/o Cm  Result Date: 08/18/2016 CLINICAL DATA:  Code stroke. Code stroke. Left upper extremity weakness. Left-sided facial droop. EXAM: CT HEAD WITHOUT CONTRAST TECHNIQUE: Contiguous axial images were obtained from the base of the skull through the vertex without intravenous contrast. COMPARISON:  MRI brain 10/05/2013. CT head without contrast 10/04/2013. FINDINGS: Brain: An inferior left cerebellar infarct is new since the prior  study, but not acute. A remote posterior right temporal and occipital lobe infarct is stable. Right basal ganglia are intact. No acute cortical infarct is present. The insular ribbon is within normal limits. The brainstem and cerebellum are otherwise unremarkable. A left frontal meningioma is stable. Atrophy and white matter disease is stable. Vascular: Atherosclerotic calcifications are present within the cavernous internal carotid arteries bilaterally. There is no hyperdense vessel. Skull: The calvarium is intact. No significant  extracranial soft tissue lesion is present. Sinuses/Orbits: Circumferential mucosal thickening is present in the maxillary sinuses bilaterally. There is mucosal thickening within the anterior right ethmoid air cells and inferior right frontal sinus. A fluid level is present within the right sphenoidal recess. There is mild mucosal thickening posteriorly in the right sphenoid sinus. The left sphenoid sinus is clear. The mastoid air cells are clear. ASPECTS [facility] Stroke Program Early CT Score) - Ganglionic level infarction (caudate, lentiform nuclei, internal capsule, insula, M1-M3 cortex): 7/7 - Supraganglionic infarction (M4-M6 cortex): 3/3 Total score (0-10 with 10 being normal): 10/10 IMPRESSION: 1. No acute intracranial abnormality. 2. Interval infarct within the inferior left cerebellum does not appear acute. 3. Stable remote posterior right temporal and occipital lobe infarct. 4. Stable atrophy and white matter disease. 5. ASPECTS is 10/10 These results were called by telephone at the time of interpretation on 08/18/2016 at 10:48 am to Dr. Lawana Pai, who verbally acknowledged these results. Electronically Signed   By: San Morelle M.D.   On: 08/18/2016 10:49    EKG: Independently reviewed. No stemi  Assessment/Plan Principal Problem:   Facial droop Active Problems:   HLD (hyperlipidemia)   Anxiety state   Essential hypertension   GERD   Constipation   Polymyalgia  rheumatica (HCC)   Facial Droop NIH score currently 1 DDx TIA vs stroke vs illness causing sx to reoccur CT head negative for acute bleed MRI/MRA ordered Aspirin full dose given  And daily A1c ordered Lipid panel ordered Admitted to telemetry bed Echo ordered Carotid Dopplers ordered Stroke team to follow patient Cont plavix  Hypertension, chronic When necessary hydralazine 10 mg IV as needed for severe blood pressure Hold home BP meds Permissive HTN  Hyperlipidemia Continue statin  Anxiety No SI/HI Klonopin prn  Constipation Dulcolax prn  PMR Hold tramadol for now  GERD Cont PPI once passes PO challenge  Code Status: DO NOT RESUSCITATE  DVT Prophylaxis: Lovenox Family Communication: Patient requested that family not be contacted Disposition Plan: Pending Improvement  Status: obs tele   Elwin Mocha, MD Family Medicine [facility] [email or web address] Password [email or web address]

## 2016-08-18 NOTE — ED Notes (Signed)
Attempted report 

## 2016-08-18 NOTE — Consult Note (Signed)
Reason for Consult: code-stroke Referring Physician: ED  Christine Middleton is an 83 y.o. female.  HPI:  LKW possibly at 930am per son who noticed left face was "drawn".  Patient herself thought her speech may have been off at one point but not sure when.  She said she was not feeling well when she woke up this morning.  She was caring for her husband as usual.  She feels essentially back to baseline now.  She does have a prior stroke of the right hemisphere that may cause residual left sided symptoms.  She is already on Plavix and claims to be compliant.   Past Medical History:  Diagnosis Date  . Diverticulitis   . Esophageal stricture   . GERD (gastroesophageal reflux disease)   . Hyperlipidemia   . Hypertension   . Shingles   . Stroke ([place])   . Vitamin B 12 deficiency     Past Surgical History:  Procedure Laterality Date  . ABDOMINAL HYSTERECTOMY    . APPENDECTOMY    . COLONOSCOPY  12-09-08   per Dr. Sharlett Iles (incomplete along with barium enema 12-10-08) with hemorrhoids  and diverticulae  . ESOPHAGOGASTRODUODENOSCOPY  09-2004   per Dr. Sharlett Iles with dilatation   . EYE SURGERY    . TONSILLECTOMY      Family History  Problem Relation Age of Onset  . Heart attack Mother   . Heart disease Father   . Breast cancer    . Diabetes    . Hypertension    . Stroke    . Heart disease    . Stroke Sister   . Cancer Brother   . Cancer Brother   . Diabetes Brother     Social History:  reports that she has never smoked. She has never used smokeless tobacco. She reports that she does not drink alcohol or use drugs.  Allergies:  Allergies  Allergen Reactions  . Amoxicillin     REACTION: rash  . Codeine   . Lisinopril   . Nitrofurantoin     Medications: I have reviewed the patient's current medications.  Results for orders placed or performed during the hospital encounter of 08/18/16 (from the past 48 hour(s))  Protime-INR     Status: None   Collection Time: 08/18/16 10:33 AM   Result Value Ref Range   Prothrombin Time 13.6 11.4 - 15.2 seconds   INR 1.04   APTT     Status: None   Collection Time: 08/18/16 10:33 AM  Result Value Ref Range   aPTT 28 24 - 36 seconds  CBC     Status: Abnormal   Collection Time: 08/18/16 10:33 AM  Result Value Ref Range   WBC 7.2 4.0 - 10.5 K/uL   RBC 3.93 3.87 - 5.11 MIL/uL   Hemoglobin 12.1 12.0 - 15.0 g/dL   HCT 35.7 (L) 36.0 - 46.0 %   MCV 90.8 78.0 - 100.0 fL   MCH 30.8 26.0 - 34.0 pg   MCHC 33.9 30.0 - 36.0 g/dL   RDW 13.1 11.5 - 15.5 %   Platelets 206 150 - 400 K/uL  Differential     Status: None   Collection Time: 08/18/16 10:33 AM  Result Value Ref Range   Neutrophils Relative % 66 %   Neutro Abs 4.8 1.7 - 7.7 K/uL   Lymphocytes Relative 22 %   Lymphs Abs 1.6 0.7 - 4.0 K/uL   Monocytes Relative 7 %   Monocytes Absolute 0.5 0.1 - 1.0 K/uL  Eosinophils Relative 4 %   Eosinophils Absolute 0.3 0.0 - 0.7 K/uL   Basophils Relative 1 %   Basophils Absolute 0.1 0.0 - 0.1 K/uL  I-stat troponin, ED     Status: None   Collection Time: 08/18/16 10:36 AM  Result Value Ref Range   Troponin i, poc 0.00 0.00 - 0.08 ng/mL   Comment 3            Comment: Due to the release kinetics of cTnI, a negative result within the first hours of the onset of symptoms does not rule out myocardial infarction with certainty. If myocardial infarction is still suspected, repeat the test at appropriate intervals.   I-Stat Chem 8, ED     Status: Abnormal   Collection Time: 08/18/16 10:38 AM  Result Value Ref Range   Sodium 140 135 - 145 mmol/L   Potassium 3.5 3.5 - 5.1 mmol/L   Chloride 105 101 - 111 mmol/L   BUN 20 6 - 20 mg/dL   Creatinine, Ser 0.90 0.44 - 1.00 mg/dL   Glucose, Bld 112 (H) 65 - 99 mg/dL   Calcium, Ion 1.01 (L) 1.15 - 1.40 mmol/L   TCO2 24 0 - 100 mmol/L   Hemoglobin 11.6 (L) 12.0 - 15.0 g/dL   HCT 34.0 (L) 36.0 - 46.0 %    Ct Head Code Stroke W/o Cm  Result Date: 08/18/2016 CLINICAL DATA:  Code stroke. Code  stroke. Left upper extremity weakness. Left-sided facial droop. EXAM: CT HEAD WITHOUT CONTRAST TECHNIQUE: Contiguous axial images were obtained from the base of the skull through the vertex without intravenous contrast. COMPARISON:  MRI brain 10/05/2013. CT head without contrast 10/04/2013. FINDINGS: Brain: An inferior left cerebellar infarct is new since the prior study, but not acute. A remote posterior right temporal and occipital lobe infarct is stable. Right basal ganglia are intact. No acute cortical infarct is present. The insular ribbon is within normal limits. The brainstem and cerebellum are otherwise unremarkable. A left frontal meningioma is stable. Atrophy and white matter disease is stable. Vascular: Atherosclerotic calcifications are present within the cavernous internal carotid arteries bilaterally. There is no hyperdense vessel. Skull: The calvarium is intact. No significant extracranial soft tissue lesion is present. Sinuses/Orbits: Circumferential mucosal thickening is present in the maxillary sinuses bilaterally. There is mucosal thickening within the anterior right ethmoid air cells and inferior right frontal sinus. A fluid level is present within the right sphenoidal recess. There is mild mucosal thickening posteriorly in the right sphenoid sinus. The left sphenoid sinus is clear. The mastoid air cells are clear. ASPECTS [facility] Stroke Program Early CT Score) - Ganglionic level infarction (caudate, lentiform nuclei, internal capsule, insula, M1-M3 cortex): 7/7 - Supraganglionic infarction (M4-M6 cortex): 3/3 Total score (0-10 with 10 being normal): 10/10 IMPRESSION: 1. No acute intracranial abnormality. 2. Interval infarct within the inferior left cerebellum does not appear acute. 3. Stable remote posterior right temporal and occipital lobe infarct. 4. Stable atrophy and white matter disease. 5. ASPECTS is 10/10 These results were called by telephone at the time of interpretation on 08/18/2016  at 10:48 am to Dr. Lawana Pai, who verbally acknowledged these results. Electronically Signed   By: San Morelle M.D.   On: 08/18/2016 10:49    ROS  Denies headache, visual disturbance or any ongoing difficulty with speech or swallowing  Blood pressure 157/71, pulse 68, resp. rate 18, height 5\' 3"  (1.6 m), weight 66 kg (145 lb 8.1 oz), SpO2 97 %. Physical Exam No distress NEURO  MS - alert, oriented, fluent, recognizes pictures CN - no facial droop Motor - no significant drift Sensory - no extinction DTR - symmetric Coor - no dysmetria Station/gait - not tested  Assessment/Plan: 1. Probable TIA Patient back to baseline.  Will proceed with TIA work-up.  Doren Custard 08/18/2016, 11:10 AM

## 2016-08-18 NOTE — Care Management Obs Status (Signed)
[place] NOTIFICATION   Patient Details  Name: Christine Middleton MRN: [id number] Date of Birth: 22-Feb-1923   Medicare Observation Status Notification Given:  Ernesta Amble, RN 08/18/2016, 5:34 PM

## 2016-08-18 NOTE — ED Triage Notes (Signed)
Patient presents to ed via [facility] question last seen normal 940 am left arm weakness. Upon arrival to ed alert oriented able to move all ext. No weakness. Speech clear. Slight facial droop.

## 2016-08-18 NOTE — Progress Notes (Signed)
*  PRELIMINARY RESULTS* Vascular Ultrasound Carotid Duplex has been completed.  Preliminary findings: Bilateral: No significant (1-39%) ICA stenosis. Antegrade vertebral flow.   Landry Mellow, RDMS, RVT  08/18/2016, 4:33 PM

## 2016-08-18 NOTE — Progress Notes (Signed)
Patient accepted from ED. A &O X 4, denies pain. MD notified. Will continue to monitor

## 2016-08-18 NOTE — Code Documentation (Addendum)
83 y.o. female woke up this morning "feeling bad" but with no focal deficits. She was witnessed by her son at 51 as to be at her baseline. She then went into bathroom and when she exited at 0940 the son noticed some left facial droop and thus prompted EMS activation. EMS assessed a left facial droop, LUE hemiparesis, and slurred speech. Upon arrival to the ED, labs were drawn and pt taken to CT. CT showed no acute intracranial abnormalities. Pt taken back to the ED. NIHSS 3. See EMR for NIHSS and code stroke times. Currently the patient has some mild left sided facial droop. tPA not given d/t symptoms mild/improving. Code stroke canceled. Bedside handoff with ED RN Roselyn Reef

## 2016-08-18 NOTE — Progress Notes (Signed)
  Echocardiogram 2D Echocardiogram has been performed.  Christine Middleton 08/18/2016, 2:14 PM

## 2016-08-18 NOTE — ED Notes (Signed)
Pt returned from MRI and placed back on monitor. Pt ambulated to RR.

## 2016-08-18 NOTE — ED Provider Notes (Signed)
[facility] DEPT Provider Note   CSN: [id number] Arrival date & time: 08/18/16  1027     History   Chief Complaint Chief Complaint  Patient presents with  . Code Stroke    HPI Christine Middleton is a 83 y.o. female. Chief complaint is possible stroke, left facial droop, abnormal speech.  HPI:  83 year old female whose past medical history includes hypertension, previous right hemispheric CVA with left facial droop. She states she waking this morning "not feeling so good". She states her head felt full. States she saw some flashing lights in her vision in her vision was a bit off. She got up about 9. She cares for her husband who is bedridden. She was able to drink some coffee and take her morning medication and tend to her husband without noticeable symptoms.  Soon thereafter she states her speech felt garbled and her son noticed that her mouth was "drooping more" and that her son felt her speech was dysarthric as well.  Apparently neighbors felt that her left arm was weak and she was having difficulty using it. She did not notice this and her arm is working normally upon arrival.  Past Medical History:  Diagnosis Date  . Diverticulitis   . Esophageal stricture   . GERD (gastroesophageal reflux disease)   . Hyperlipidemia   . Hypertension   . Shingles   . Stroke ([place])   . Vitamin B 12 deficiency     Patient Active Problem List   Diagnosis Date Noted  . CVA (cerebral infarction) 10/04/2013  . HYPERGLYCEMIA 06/30/2010  . CEREBROVASCULAR ACCIDENT 07/19/2009  . RENAL INSUFFICIENCY 07/19/2009  . POLYMYALGIA RHEUMATICA 07/19/2009  . VITAMIN B12 DEFICIENCY 06/30/2009  . ANXIETY 06/28/2009  . PERIPHERAL NEUROPATHY, FEET 06/28/2009  . HEMORRHOIDS-EXTERNAL 12/03/2008  . ESOPHAGEAL STRICTURE 11/30/2008  . DIVERTICULOSIS, COLON 11/30/2008  . GROSS HEMATURIA 11/04/2008  . MYALGIA 11/04/2008  . CONSTIPATION 06/24/2008  . ACUTE CYSTITIS 03/12/2008  . HYPERLIPIDEMIA 05/30/2007  .  SKIN LESION 05/30/2007  . HYPERTENSION 03/19/2007  . GERD 03/19/2007    Past Surgical History:  Procedure Laterality Date  . ABDOMINAL HYSTERECTOMY    . APPENDECTOMY    . COLONOSCOPY  12-09-08   per Dr. Sharlett Iles (incomplete along with barium enema 12-10-08) with hemorrhoids  and diverticulae  . ESOPHAGOGASTRODUODENOSCOPY  09-2004   per Dr. Sharlett Iles with dilatation   . EYE SURGERY    . TONSILLECTOMY      OB History    No data available       Home Medications    Prior to Admission medications   Medication Sig Start Date End Date Taking? Authorizing Provider  atenolol (TENORMIN) 100 MG tablet TAKE 1 TABLET (100 MG TOTAL) BY MOUTH DAILY. 01/25/16   Laurey Morale, MD  B Complex-C (SUPER B COMPLEX/VITAMIN C PO) Take 1 tablet by mouth daily.    Historical Provider, MD  clopidogrel (PLAVIX) 75 MG tablet TAKE 1 TABLET BY MOUTH DAILY WITH BREAKFAST 07/17/16   Laurey Morale, MD  hydrochlorothiazide (HYDRODIURIL) 25 MG tablet TAKE 1 TABLET (25 MG TOTAL) BY MOUTH DAILY. 12/10/15   Laurey Morale, MD  losartan (COZAAR) 100 MG tablet Take 100 mg by mouth daily.    Historical Provider, MD  losartan (COZAAR) 100 MG tablet TAKE 1 TABLET EVERY DAY 12/10/15   Laurey Morale, MD  pantoprazole (PROTONIX) 40 MG tablet TAKE 1 TABLET BY MOUTH EVERY DAY 07/04/16   Laurey Morale, MD  simvastatin (ZOCOR) 20 MG tablet  TAKE 1 TABLET (20 MG TOTAL) BY MOUTH AT BEDTIME. 05/23/16   Laurey Morale, MD  traMADol (ULTRAM) 50 MG tablet Take 1 tablet (50 mg total) by mouth every 6 (six) hours as needed for moderate pain. 08/05/15   Laurey Morale, MD    Family History Family History  Problem Relation Age of Onset  . Heart attack Mother   . Heart disease Father   . Breast cancer    . Diabetes    . Hypertension    . Stroke    . Heart disease    . Stroke Sister   . Cancer Brother   . Cancer Brother   . Diabetes Brother     Social History Social History  Substance Use Topics  . Smoking status: Never Smoker  .  Smokeless tobacco: Never Used  . Alcohol use No     Allergies   Amoxicillin; Codeine; Lisinopril; and Nitrofurantoin   Review of Systems Review of Systems  Constitutional: Negative for appetite change, chills, diaphoresis, fatigue and fever.  HENT: Negative for mouth sores, sore throat and trouble swallowing.   Eyes: Positive for visual disturbance. Negative for photophobia.  Respiratory: Negative for cough, chest tightness, shortness of breath and wheezing.   Cardiovascular: Negative for chest pain.  Gastrointestinal: Negative for abdominal distention, abdominal pain, diarrhea, nausea and vomiting.  Endocrine: Negative for polydipsia, polyphagia and polyuria.  Genitourinary: Negative for dysuria, frequency and hematuria.  Musculoskeletal: Negative for gait problem.  Skin: Negative for color change, pallor and rash.  Neurological: Negative for dizziness, syncope and light-headedness.       Facial droop and speech difficulty.  Hematological: Does not bruise/bleed easily.  Psychiatric/Behavioral: Negative for behavioral problems and confusion.     Physical Exam Updated Vital Signs BP 157/71   Pulse 68   Resp 18   Ht 5\' 3"  (1.6 m)   Wt 145 lb 8.1 oz (66 kg)   SpO2 97%   BMI 25.77 kg/m   Physical Exam  Constitutional: She is oriented to person, place, and time. She appears well-developed and well-nourished. No distress.  Pleasant, awake and alert 83 year old female. She offers details of her own history. She is lucid and oriented.  HENT:  Head: Normocephalic.  Eyes: Conjunctivae are normal. Pupils are equal, round, and reactive to light. No scleral icterus.  Neck: Normal range of motion. Neck supple. No thyromegaly present.  Cardiovascular: Normal rate and regular rhythm.  Exam reveals no gallop and no friction rub.   No murmur heard. Pulmonary/Chest: Effort normal and breath sounds normal. No respiratory distress. She has no wheezes. She has no rales.  Abdominal: Soft.  Bowel sounds are normal. She exhibits no distension. There is no tenderness. There is no rebound.  Musculoskeletal: Normal range of motion.  Neurological: She is alert and oriented to person, place, and time.  She has a left lower facial droop. No upper facial weakness. Remainder of her cranial nerves are intact and symmetric. She doesn't have pronator drift or leg drift. Patient was not ambulated.  Skin: Skin is warm and dry. No rash noted.  Psychiatric: She has a normal mood and affect. Her behavior is normal.     ED Treatments / Results  Labs (all labs ordered are listed, but only abnormal results are displayed) Labs Reviewed  CBC - Abnormal; Notable for the following:       Result Value   HCT 35.7 (*)    All other components within normal limits  COMPREHENSIVE METABOLIC  PANEL - Abnormal; Notable for the following:    Glucose, Bld 105 (*)    Total Protein 6.3 (*)    ALT 12 (*)    GFR calc non Af Amer 48 (*)    GFR calc Af Amer 56 (*)    All other components within normal limits  I-STAT CHEM 8, ED - Abnormal; Notable for the following:    Glucose, Bld 112 (*)    Calcium, Ion 1.01 (*)    Hemoglobin 11.6 (*)    HCT 34.0 (*)    All other components within normal limits  PROTIME-INR  APTT  DIFFERENTIAL  I-STAT TROPOININ, ED  CBG MONITORING, ED    EKG  EKG Interpretation None       Radiology Ct Head Code Stroke W/o Cm  Result Date: 08/18/2016 CLINICAL DATA:  Code stroke. Code stroke. Left upper extremity weakness. Left-sided facial droop. EXAM: CT HEAD WITHOUT CONTRAST TECHNIQUE: Contiguous axial images were obtained from the base of the skull through the vertex without intravenous contrast. COMPARISON:  MRI brain 10/05/2013. CT head without contrast 10/04/2013. FINDINGS: Brain: An inferior left cerebellar infarct is new since the prior study, but not acute. A remote posterior right temporal and occipital lobe infarct is stable. Right basal ganglia are intact. No acute  cortical infarct is present. The insular ribbon is within normal limits. The brainstem and cerebellum are otherwise unremarkable. A left frontal meningioma is stable. Atrophy and white matter disease is stable. Vascular: Atherosclerotic calcifications are present within the cavernous internal carotid arteries bilaterally. There is no hyperdense vessel. Skull: The calvarium is intact. No significant extracranial soft tissue lesion is present. Sinuses/Orbits: Circumferential mucosal thickening is present in the maxillary sinuses bilaterally. There is mucosal thickening within the anterior right ethmoid air cells and inferior right frontal sinus. A fluid level is present within the right sphenoidal recess. There is mild mucosal thickening posteriorly in the right sphenoid sinus. The left sphenoid sinus is clear. The mastoid air cells are clear. ASPECTS [facility] Stroke Program Early CT Score) - Ganglionic level infarction (caudate, lentiform nuclei, internal capsule, insula, M1-M3 cortex): 7/7 - Supraganglionic infarction (M4-M6 cortex): 3/3 Total score (0-10 with 10 being normal): 10/10 IMPRESSION: 1. No acute intracranial abnormality. 2. Interval infarct within the inferior left cerebellum does not appear acute. 3. Stable remote posterior right temporal and occipital lobe infarct. 4. Stable atrophy and white matter disease. 5. ASPECTS is 10/10 These results were called by telephone at the time of interpretation on 08/18/2016 at 10:48 am to Dr. Lawana Pai, who verbally acknowledged these results. Electronically Signed   By: San Morelle M.D.   On: 08/18/2016 10:49    Procedures Procedures (including critical care time)  Medications Ordered in ED Medications  iopamidol (ISOVUE-370) 76 % injection (50 mLs  Contrast Given 08/18/16 1046)     Initial Impression / Assessment and Plan / ED Course  I have reviewed the triage vital signs and the nursing notes.  Pertinent labs & imaging results that were available  during my care of the patient were reviewed by me and considered in my medical decision making (see chart for details).     NIH of 1. Patient presents last time known well possibly last night. First person she interacted with this morning noticed her symptoms at approximately 940. Not a candidate for TPA because of old previous encephalomalacia from previous CVA, and NIH of only one at this time.  Per neuro hospitalist will admit for further workup for possible recurrent  CVA.  Final Clinical Impressions(s) / ED Diagnoses   Final diagnoses:  Cerebrovascular accident (CVA), unspecified mechanism ([place])    New Prescriptions New Prescriptions   No medications on file     Tanna Furry, MD 08/18/16 1114

## 2016-08-19 DIAGNOSIS — Z885 Allergy status to narcotic agent status: Secondary | ICD-10-CM | POA: Diagnosis not present

## 2016-08-19 DIAGNOSIS — R29810 Facial weakness: Secondary | ICD-10-CM

## 2016-08-19 DIAGNOSIS — M353 Polymyalgia rheumatica: Secondary | ICD-10-CM | POA: Diagnosis present

## 2016-08-19 DIAGNOSIS — K5900 Constipation, unspecified: Secondary | ICD-10-CM | POA: Diagnosis present

## 2016-08-19 DIAGNOSIS — Z7902 Long term (current) use of antithrombotics/antiplatelets: Secondary | ICD-10-CM | POA: Diagnosis not present

## 2016-08-19 DIAGNOSIS — I10 Essential (primary) hypertension: Secondary | ICD-10-CM | POA: Diagnosis not present

## 2016-08-19 DIAGNOSIS — Z79899 Other long term (current) drug therapy: Secondary | ICD-10-CM | POA: Diagnosis not present

## 2016-08-19 DIAGNOSIS — I6310 Cerebral infarction due to embolism of unspecified precerebral artery: Secondary | ICD-10-CM | POA: Diagnosis not present

## 2016-08-19 DIAGNOSIS — Z79891 Long term (current) use of opiate analgesic: Secondary | ICD-10-CM | POA: Diagnosis not present

## 2016-08-19 DIAGNOSIS — Z8673 Personal history of transient ischemic attack (TIA), and cerebral infarction without residual deficits: Secondary | ICD-10-CM | POA: Diagnosis not present

## 2016-08-19 DIAGNOSIS — F411 Generalized anxiety disorder: Secondary | ICD-10-CM | POA: Diagnosis present

## 2016-08-19 DIAGNOSIS — E538 Deficiency of other specified B group vitamins: Secondary | ICD-10-CM | POA: Diagnosis present

## 2016-08-19 DIAGNOSIS — I639 Cerebral infarction, unspecified: Secondary | ICD-10-CM | POA: Diagnosis not present

## 2016-08-19 DIAGNOSIS — Z888 Allergy status to other drugs, medicaments and biological substances status: Secondary | ICD-10-CM | POA: Diagnosis not present

## 2016-08-19 DIAGNOSIS — R471 Dysarthria and anarthria: Secondary | ICD-10-CM | POA: Diagnosis present

## 2016-08-19 DIAGNOSIS — Z823 Family history of stroke: Secondary | ICD-10-CM | POA: Diagnosis not present

## 2016-08-19 DIAGNOSIS — K219 Gastro-esophageal reflux disease without esophagitis: Secondary | ICD-10-CM | POA: Diagnosis present

## 2016-08-19 DIAGNOSIS — E785 Hyperlipidemia, unspecified: Secondary | ICD-10-CM | POA: Diagnosis not present

## 2016-08-19 DIAGNOSIS — Z880 Allergy status to penicillin: Secondary | ICD-10-CM | POA: Diagnosis not present

## 2016-08-19 DIAGNOSIS — I63439 Cerebral infarction due to embolism of unspecified posterior cerebral artery: Secondary | ICD-10-CM | POA: Diagnosis not present

## 2016-08-19 DIAGNOSIS — Z66 Do not resuscitate: Secondary | ICD-10-CM | POA: Diagnosis present

## 2016-08-19 DIAGNOSIS — I6340 Cerebral infarction due to embolism of unspecified cerebral artery: Secondary | ICD-10-CM | POA: Diagnosis present

## 2016-08-19 DIAGNOSIS — R569 Unspecified convulsions: Secondary | ICD-10-CM | POA: Diagnosis present

## 2016-08-19 DIAGNOSIS — Z8249 Family history of ischemic heart disease and other diseases of the circulatory system: Secondary | ICD-10-CM | POA: Diagnosis not present

## 2016-08-19 DIAGNOSIS — I4891 Unspecified atrial fibrillation: Secondary | ICD-10-CM | POA: Diagnosis present

## 2016-08-19 DIAGNOSIS — Z881 Allergy status to other antibiotic agents status: Secondary | ICD-10-CM | POA: Diagnosis not present

## 2016-08-19 LAB — LIPID PANEL
Cholesterol: 168 mg/dL (ref 0–200)
HDL: 66 mg/dL (ref >40)
LDL Cholesterol: 76 mg/dL (ref 0–99)
Total CHOL/HDL Ratio: 2.5 RATIO
Triglycerides: 128 mg/dL (ref <150)
VLDL: 26 mg/dL (ref 0–40)

## 2016-08-19 MED ORDER — ATENOLOL 25 MG PO TABS
50.0000 mg | ORAL_TABLET | Freq: Every day | ORAL | Status: DC
  Administered 2016-08-21 – 2016-08-22 (×2): 50 mg via ORAL
  Filled 2016-08-19 (×2): qty 2

## 2016-08-19 NOTE — Progress Notes (Signed)
STROKE TEAM PROGRESS NOTE   HISTORY OF PRESENT ILLNESS (per record) Christine Middleton is an 83 y.o. female LKW possibly at 52 am per son who noticed left face was "drawn".  Patient herself thought her speech may have been off at one point but not sure when.  She said she was not feeling well when she woke up this morning.  She was caring for her husband as usual.  She feels essentially back to baseline now.  She does have a prior stroke of the right hemisphere that may cause residual left sided symptoms.  She is already on Plavix and claims to be compliant.   SUBJECTIVE (INTERVAL HISTORY) No family members present. The patient cares for her husband at home. Apparently the patient wore a cardiac monitor after her previous stroke to look for atrial fibrillation.   OBJECTIVE Temp:  [97.5 F (36.4 C)-98.4 F (36.9 C)] 98.4 F (36.9 C) (01/20 0530) Pulse Rate:  [63-90] 70 (01/20 0530) Cardiac Rhythm: Normal sinus rhythm (01/19 1900) Resp:  [10-24] 18 (01/20 0530) BP: (113-172)/(42-132) 127/54 (01/20 0530) SpO2:  [90 %-100 %] 98 % (01/20 0530) Weight:  [66 kg (145 lb 8.1 oz)] 66 kg (145 lb 8.1 oz) (01/19 1059)  CBC:  Recent Labs Lab 08/18/16 1033 08/18/16 1038  WBC 7.2  --   NEUTROABS 4.8  --   HGB 12.1 11.6*  HCT 35.7* 34.0*  MCV 90.8  --   PLT 206  --     Basic Metabolic Panel:  Recent Labs Lab 08/18/16 1033 08/18/16 1038  NA 140 140  K 3.7 3.5  CL 105 105  CO2 24  --   GLUCOSE 105* 112*  BUN 19 20  CREATININE 0.99 0.90  CALCIUM 9.0  --     Lipid Panel:    Component Value Date/Time   CHOL 168 08/19/2016 0332   TRIG 128 08/19/2016 0332   TRIG 92 05/28/2006 1104   HDL 66 08/19/2016 0332   CHOLHDL 2.5 08/19/2016 0332   VLDL 26 08/19/2016 0332   LDLCALC 76 08/19/2016 0332   HgbA1c:  Lab Results  Component Value Date   HGBA1C 6.1 07/28/2014   Urine Drug Screen:    Component Value Date/Time   LABOPIA NONE DETECTED 10/04/2013 2030   COCAINSCRNUR NONE DETECTED  10/04/2013 2030   LABBENZ NONE DETECTED 10/04/2013 2030   AMPHETMU NONE DETECTED 10/04/2013 2030   THCU NONE DETECTED 10/04/2013 2030   LABBARB NONE DETECTED 10/04/2013 2030      IMAGING   Mr Jodene Nam Head/brain Wo Cm 08/18/2016 1. Right mid M2 branch vessel embolic occlusion with small region of acute infarction in the corona radiata/centrum semiovale.  2. Small acute embolic or watershed infarcts in the high left frontal lobe and lateral right occipital lobe.  3. Restricted diffusion in the left hippocampus. This is also likely an acute infarct given the other coexistent findings, however this appearance can also be a transient finding without chronic sequelae.  4. New proximal left PCA occlusion, also likely embolic though without an associated acute or chronic large infarct in this territory.  5. Unchanged small left lateral convexity meningioma.  6. 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      Ct Head Code Stroke W/o Cm 08/18/2016 1. No acute intracranial abnormality.  2. Interval infarct within the inferior left cerebellum does not appear acute.  3. Stable remote posterior right temporal and occipital lobe infarct.  4. Stable atrophy and white matter disease.  5. ASPECTS  is 10/10     Transthoracic echocardiogram 08/18/2016 EF 60-65%. No cardiac source of emboli identified.     PHYSICAL EXAM Pleasant frail elderly Caucasian lady currently not in distress. . Afebrile. Head is nontraumatic. Neck is supple without bruit.    Cardiac exam no murmur or gallop. Lungs are clear to auscultation. Distal pulses are well felt. Neurological Exam ;  Awake  Alert oriented x 3. Normal speech and language.eye movements full without nystagmus.fundi were not visualized. Vision acuity and fields appear normal. Hearing is normal. Palatal movements are normal. Face symmetric. Tongue midline. Normal strength, tone, reflexes and coordination. Normal sensation. Gait  deferred.     ASSESSMENT/PLAN [name] is a 83 y.o. female with history of previous strokes, hypertension, and hyperlipidemia presenting with left facial droop and speech difficulties.  She did not receive IV t-PA due to improvement in deficits.  Strokes:  Bilateral acute and chronic strokes felt to be embolic from an unknown source.  Resultant  No deficits  MRI - multiple bilateral acute and chronic strokes felt to be embolic.  MRA - new proximal left PCA occlusion.  Carotid Doppler - carotid arteries - bilateral 1- 39 percent stenosis. The vertebral arteries patent with antegrade flow.  2D Echo - EF 60 - 65%. No cardiac source of emboli identified.  LDL - 76  HgbA1c - pending  VTE prophylaxis - Lovenox  Diet Heart Room service appropriate? Yes; Fluid consistency: Thin  clopidogrel 75 mg daily prior to admission, now on clopidogrel 75 mg daily  Patient counseled to be compliant with her antithrombotic medications  Ongoing aggressive stroke risk factor management  Therapy recommendations:  pending  Disposition: Pending  Hypertension  Stable  Permissive hypertension (OK if < 220/120) but gradually normalize in 5-7 days  Long-term BP goal normotensive  Hyperlipidemia  Home meds:  Zocor 20 mg daily prior to admission.  LDL 76, goal < 70  Zocor increased to 40 mg daily.  Continue statin at discharge   Other Stroke Risk Factors  Advanced age  Hx stroke/TIA  Family hx stroke (sister)   Other Active Problems  Mild anemia - 11.6 / 34   PLAN  TEE and loop tentatively planned for Monday.  Anticoagulate if atrial fibrillation is documented.   Hospital day # 0 I have personally examined this patient, reviewed notes, independently viewed imaging studies, participated in medical decision making and plan of care.ROS completed by me personally and pertinent positives fully documented  I have made any additions or clarifications directly to the  above note. Patient has presented with facial droop secondary to small embolic left medial temporal infarct from left PCA occlusion likely of embolic etiology. Recommend ongoing stroke evaluation and consider checking TEE and loop recorder insertion on Monday. Continue clopidogrel for stroke prevention for now. Greater than 50% time during this 35 minute visit was spent on counseling and coordination of care about stroke risk, prevention and treatment discussion.  Antony Contras, MD Medical Director [facility] Pager: [phone] 08/19/2016 1:15 PM   To contact Stroke Continuity provider, please refer to [email or web address] After hours, contact General Neurology

## 2016-08-19 NOTE — Evaluation (Signed)
Speech Language Pathology Evaluation Patient Details Name: Christine Middleton MRN: [id number] DOB: 10-17-1922 Today's Date: 08/19/2016 Time: [id number] SLP Time Calculation (min) (ACUTE ONLY): 16 min  Problem List:  Patient Active Problem List   Diagnosis Date Noted  . Facial droop 08/18/2016  . CVA (cerebral infarction) 10/04/2013  . HYPERGLYCEMIA 06/30/2010  . CEREBROVASCULAR ACCIDENT 07/19/2009  . RENAL INSUFFICIENCY 07/19/2009  . Polymyalgia rheumatica ([place]) 07/19/2009  . VITAMIN B12 DEFICIENCY 06/30/2009  . Anxiety state 06/28/2009  . PERIPHERAL NEUROPATHY, FEET 06/28/2009  . HEMORRHOIDS-EXTERNAL 12/03/2008  . ESOPHAGEAL STRICTURE 11/30/2008  . DIVERTICULOSIS, COLON 11/30/2008  . GROSS HEMATURIA 11/04/2008  . MYALGIA 11/04/2008  . Constipation 06/24/2008  . ACUTE CYSTITIS 03/12/2008  . HLD (hyperlipidemia) 05/30/2007  . SKIN LESION 05/30/2007  . Essential hypertension 03/19/2007  . GERD 03/19/2007   Past Medical History:  Past Medical History:  Diagnosis Date  . Diverticulitis   . Esophageal stricture   . GERD (gastroesophageal reflux disease)   . Hyperlipidemia   . Hypertension   . Shingles   . Stroke ([place])   . Vitamin B 12 deficiency    Past Surgical History:  Past Surgical History:  Procedure Laterality Date  . ABDOMINAL HYSTERECTOMY    . APPENDECTOMY    . COLONOSCOPY  12-09-08   per Dr. Sharlett Iles (incomplete along with barium enema 12-10-08) with hemorrhoids  and diverticulae  . ESOPHAGOGASTRODUODENOSCOPY  09-2004   per Dr. Sharlett Iles with dilatation   . EYE SURGERY    . TONSILLECTOMY     HPI:      Assessment / Plan / Recommendation Clinical Impression  Pt presented to hospital with L-sided facial droop and dysarthria, which have all resolved. She presents with L-sided labial weakness/sentivity loss that was self-reported from previous stoke (at times drools a little while eating). Pt's receptive and expressive language was intact with no evidence of memory  deficits. She did report some visual differences of flashing lights that appear only at times. Neurologist also present during session and was made aware. Pt does not need follow up speech/language therapy.     SLP Assessment  Patient does not need any further Speech Lanaguage Pathology Services    Follow Up Recommendations       Frequency and Duration           SLP Evaluation Cognition  Arousal/Alertness: Awake/alert Orientation Level: Oriented X4 Memory: Appears intact Awareness: Appears intact Problem Solving: Appears intact Safety/Judgment: Appears intact       Comprehension  Auditory Comprehension Overall Auditory Comprehension: Appears within functional limits for tasks assessed Yes/No Questions: Within Functional Limits Commands: Within Functional Limits Visual Recognition/Discrimination Discrimination: Within Function Limits Reading Comprehension Reading Status: Within funtional limits    Expression Expression Primary Mode of Expression: Verbal Verbal Expression Overall Verbal Expression: Appears within functional limits for tasks assessed Initiation: No impairment Level of Generative/Spontaneous Verbalization: Conversation Repetition: No impairment Naming: No impairment Pragmatics: No impairment Non-Verbal Means of Communication: Not applicable Written Expression Dominant Hand: Right   Oral / Motor  Oral Motor/Sensory Function Overall Oral Motor/Sensory Function: Mild impairment Facial ROM: Reduced left Facial Symmetry: Abnormal symmetry left Facial Strength: Reduced left Facial Sensation: Reduced left Lingual ROM: Within Functional Limits Lingual Symmetry: Within Functional Limits Lingual Strength: Within Functional Limits Lingual Sensation: Within Functional Limits Motor Speech Overall Motor Speech: Appears within functional limits for tasks assessed Respiration: Within functional limits Phonation: Normal Resonance: Within functional  limits Articulation: Within functional limitis Intelligibility: Intelligible Motor Planning: Witnin functional limits Motor Speech  Errors: Not applicable   [place], MA, CCC-SLP 08/19/2016 9:48 AM

## 2016-08-19 NOTE — Evaluation (Signed)
Physical Therapy Evaluation Patient Details Name: Christine Middleton MRN: [id number] DOB: 1923-01-02 Today's Date: 08/19/2016   History of Present Illness  83 yo female admitted with L facial droop and abnormal speech. MRI (+) R M2 branch vessel embolic occlusion with small region of acute infarct in the corona radiata / centrum semiovale. Small acute embolic or watershed infarcts in the high left frontal lobe and lateral R occipital lobe.   Clinical Impression  Pt admitted with above diagnosis. Pt currently with functional limitations due to the deficits listed below (see PT Problem List). Pt ambulated 250' without AD and with supervision, moving very well but was noted on stairs that she does have some RLE weakness which she describes as "my legs just feel tired". Pt will benefit from skilled PT to increase their independence and safety with mobility to allow discharge to the venue listed below.       Follow Up Recommendations Home health PT;Supervision - Intermittent    Equipment Recommendations  None recommended by PT    Recommendations for Other Services       Precautions / Restrictions Precautions Precautions: Fall Precaution Comments: fall 2 years ago outside working in the yard Restrictions [employer] Restrictions: No      Mobility  Bed Mobility               General bed mobility comments: in chair   Transfers Overall transfer level: Needs assistance Equipment used: None Transfers: Sit to/from Stand Sit to Stand: Supervision         General transfer comment: pushes up with B UE's, no LOB  Ambulation/Gait Ambulation/Gait assistance: Supervision Ambulation Distance (Feet): 250 Feet Assistive device: None Gait Pattern/deviations: Step-through pattern Gait velocity: WFL Gait velocity interpretation: at or above normal speed for age/gender General Gait Details: pt began with decreased pace but was able to increase when cued. She was also cautious at first  because of IV pole but more normal pattern after forst 50'  Stairs Stairs: Yes Stairs assistance: Min guard Stair Management: One rail Right;Alternating pattern;Forwards Number of Stairs: 2 General stair comments: had diffciulty rising to step when leading with RLE  Wheelchair Mobility    Modified Rankin (Stroke Patients Only) Modified Rankin (Stroke Patients Only) Pre-Morbid Rankin Score: No symptoms Modified Rankin: No significant disability     Balance Overall balance assessment: Modified Independent                                   Dynamic Gait Index Level Surface: Mild Impairment Change in Gait Speed: Mild Impairment Gait with Horizontal Head Turns: Mild Impairment Gait with Vertical Head Turns: Mild Impairment Gait and Pivot Turn: Normal Step Over Obstacle: Moderate Impairment Step Around Obstacles: Mild Impairment Steps: Moderate Impairment Total Score: 15       Pertinent Vitals/Pain Pain Assessment: No/denies pain    Home Living Family/patient expects to be discharged to:: Private residence Living Arrangements: Spouse/significant other;Children Available Help at Discharge: Family;Available PRN/intermittently Type of Home: House Home Access: Ramped entrance     Home Layout: Multi-level Home Equipment: Grab bars - toilet;Grab bars - tub/shower;Hand held shower head Additional Comments: normal takes care of spouse with dementia that is bed bound, sewing room in the attic and full basement but reports I am not allowed there anymore drives regularly - "says she has to hold up 3 more years because she has 3 more years on her license" -  no help with spouse currently    Prior Function Level of Independence: Independent         Comments: pt's son also helps care for her husband but it sounds like pt also provides a significant amount of emotional support for son     Hand Dominance   Dominant Hand: Right    Extremity/Trunk Assessment    Upper Extremity Assessment Upper Extremity Assessment: Defer to OT evaluation    Lower Extremity Assessment Lower Extremity Assessment: RLE deficits/detail;LLE deficits/detail RLE Deficits / Details: knee ext 4/5 (less than left) as well as noted difficulty when ascending stair with RLE, hip flex 4+/5, knee flex 4+/5 LLE Deficits / Details: WFL    Cervical / Trunk Assessment Cervical / Trunk Assessment: Normal  Communication   Communication: No difficulties  Cognition Arousal/Alertness: Awake/alert Behavior During Therapy: WFL for tasks assessed/performed Overall Cognitive Status: Within Functional Limits for tasks assessed                      General Comments General comments (skin integrity, edema, etc.): Instructed pt to ambulate 4-5x/ day with family or nursing staff    Exercises     Assessment/Plan    PT Assessment Patient needs continued PT services  PT Problem List Decreased strength;Decreased activity tolerance;Decreased balance;Decreased mobility;Decreased knowledge of precautions          PT Treatment Interventions Gait training;Stair training;Functional mobility training;Therapeutic activities;Therapeutic exercise;Balance training;Patient/family education    PT Goals (Current goals can be found in the Care Plan section)  Acute Rehab PT Goals Patient Stated Goal: to go home and take care of spouse PT Goal Formulation: With patient Time For Goal Achievement: 09/02/16 Potential to Achieve Goals: Good    Frequency Min 4X/week   Barriers to discharge Decreased caregiver support cares for self    Co-evaluation               End of Session Equipment Utilized During Treatment: Gait belt Activity Tolerance: Patient tolerated treatment well Patient left: in chair;with call bell/phone within reach;with family/visitor present Nurse Communication: Mobility status    Functional Assessment Tool Used: clinical judgement Functional Limitation:  Mobility: Walking and moving around Mobility: Walking and Moving Around Current Status [phone]): At least 1 percent but less than 20 percent impaired, limited or restricted Mobility: Walking and Moving Around Goal Status [phone]): 0 percent impaired, limited or restricted    Time: [id number] PT Time Calculation (min) (ACUTE ONLY): 20 min   Charges:   PT Evaluation $PT Eval Moderate Complexity: 1 Procedure     PT G Codes:   PT G-Codes **NOT FOR INPATIENT CLASS** Functional Assessment Tool Used: clinical judgement Functional Limitation: Mobility: Walking and moving around Mobility: Walking and Moving Around Current Status [id number]): At least 1 percent but less than 20 percent impaired, limited or restricted Mobility: Walking and Moving Around Goal Status [phone]): 0 percent impaired, limited or restricted  Leighton Roach, [place] 08/19/2016, 4:24 PM

## 2016-08-19 NOTE — Progress Notes (Signed)
PROGRESS NOTE  Shrita Brosseau Montanye  [id number] DOB: 04-Mar-1923  DOA: 08/18/2016 PCP: [name], MD   Brief Narrative:  Ms Argueta is a 83 year old lady with previous history of stroke and hypertension as well as hyperlipidemia who presented with one-day history of left facial droop and dysarthria on 08/18/2006. Last time that she was noted to be in her usual state of health was several hours prior to presentation and was out of TPA window. Code stroke was rescinded. She had a negative CT scan of the head as well as carotid duplex scan.  Assessment & Plan:   Principal Problem:   Facial droop Active Problems:   HLD (hyperlipidemia)   Anxiety state   Essential hypertension   GERD   Constipation   Polymyalgia rheumatica (HCC)   Embolic stroke (HCC)   1. Embolic CVA: 2-D echo 123XX123 - EF 60-65%, no regional wall motion abnormality. Grade 2 diastolic dysfunction. No cardiac source of emboli. Carotid Doppler 08/18/2016 - negative. MRI/A of the brain 08/19/2016 was indicative of embolic stroke. Neurology consulted. Concerns for possible atrial fibrillation as etiology - cardiology consulted for TEE and loop recorder. Secondary preventative measures instituted  DVT prophylaxis: Lovenox Code Status: DO NOT RESUSCITATE Family Communication: Patient does not wish for family communication. Disposition Plan: Pending   Consultants:  Neurology - Dr. Antony Contras Cardiology - Dr. Marlou Porch  Procedures:  TEE and loop recorder pending  Antimicrobials:  None   Subjective: Transient facial droop. No acute events noted overnight. No seizure activity.  Objective:  Vitals:   08/19/16 0320 08/19/16 0530 08/19/16 1117 08/19/16 1433  BP: (!) 114/57 (!) 127/54 (!) 133/41 117/60  Pulse: 66 70 66 74  Resp: 17 18 18 18   Temp: 97.7 F (36.5 C) 98.4 F (36.9 C) 98.1 F (36.7 C) 97.7 F (36.5 C)  TempSrc: Oral Oral Oral Oral  SpO2: 94% 98% 97% 95%  Weight:      Height:         Intake/Output Summary (Last 24 hours) at 08/19/16 1737 Last data filed at 08/19/16 0645  Gross per 24 hour  Intake          1856.67 ml  Output                0 ml  Net          1856.67 ml   Filed Weights   08/18/16 1000 08/18/16 1059  Weight: 66 kg (145 lb 8.1 oz) 66 kg (145 lb 8.1 oz)    Examination:  General exam: Resting comfortably, looks younger than his stated age of 65 years Respiratory system: Clear to auscultation. Respiratory effort normal. Cardiovascular system: S1 & S2 heard, RRR. No JVD, murmurs, rubs, gallops or clicks. No pedal edema. Gastrointestinal system: Abdomen is nondistended, soft and nontender. No organomegaly or masses felt. Normal bowel sounds heard. Central nervous system: Alert and oriented. Mild left facial droop otherwise no obvious focal neurological deficits. Extremities: Symmetric 5 x 5 power.  Psychiatry: Judgement and insight appear normal. Mood & affect appropriate.     Data Reviewed: I have personally reviewed following labs and imaging studies  CBC:  Recent Labs Lab 08/18/16 1033 08/18/16 1038  WBC 7.2  --   NEUTROABS 4.8  --   HGB 12.1 11.6*  HCT 35.7* 34.0*  MCV 90.8  --   PLT 206  --    Basic Metabolic Panel:  Recent Labs Lab 08/18/16 1033 08/18/16 1038  NA 140 140  K 3.7 3.5  CL 105 105  CO2 24  --   GLUCOSE 105* 112*  BUN 19 20  CREATININE 0.99 0.90  CALCIUM 9.0  --    GFR: Estimated Creatinine Clearance: 36.4 mL/min (by C-G formula based on SCr of 0.9 mg/dL). Liver Function Tests:  Recent Labs Lab 08/18/16 1033  AST 22  ALT 12*  ALKPHOS 84  BILITOT 0.8  PROT 6.3*  ALBUMIN 3.6   No results for input(s): LIPASE, AMYLASE in the last 168 hours. No results for input(s): AMMONIA in the last 168 hours. Coagulation Profile:  Recent Labs Lab 08/18/16 1033  INR 1.04   Cardiac Enzymes: No results for input(s): CKTOTAL, CKMB, CKMBINDEX, TROPONINI in the last 168 hours. BNP (last 3 results) No  results for input(s): PROBNP in the last 8760 hours. HbA1C: No results for input(s): HGBA1C in the last 72 hours. CBG: No results for input(s): GLUCAP in the last 168 hours. Lipid Profile:  Recent Labs  08/19/16 0332  CHOL 168  HDL 66  LDLCALC 76  TRIG 128  CHOLHDL 2.5   Thyroid Function Tests: No results for input(s): TSH, T4TOTAL, FREET4, T3FREE, THYROIDAB in the last 72 hours. Anemia Panel: No results for input(s): VITAMINB12, FOLATE, FERRITIN, TIBC, IRON, RETICCTPCT in the last 72 hours.  Sepsis Labs:  Recent Labs Lab 08/18/16 1033  WBC 7.2    No results found for this or any previous visit (from the past 240 hour(s)).       Radiology Studies: Mr Brain Wo Contrast  Result Date: 08/18/2016 CLINICAL DATA:  Dysarthria and facial droop.  Prior stroke. EXAM: MRI HEAD WITHOUT CONTRAST MRA HEAD WITHOUT CONTRAST TECHNIQUE: Multiplanar, multiecho pulse sequences of the brain and surrounding structures were obtained without intravenous contrast. Angiographic images of the head were obtained using MRA technique without contrast. COMPARISON:  Head CT 08/18/2016.  Head MRI and MRA 10/05/2013. FINDINGS: MRI HEAD FINDINGS Brain: There is a small region of patchy/hazy increased trace diffusion signal with slightly reduced ADC in the right corona radiata and centrum semiovale. A few discrete subcentimeter foci of restricted diffusion are present in the subcortical white matter of the left superior frontal gyrus near the vertex as well as involving the lateral right occipital lobe cortex and may reflect left ACA/MCA and right MCA/ PCA watershed infarcts, respectively. There is also a small region of restricted diffusion in the mesial left temporal lobe at the level of the hippocampal head. A chronic right temporal-occipital infarct is unchanged from the prior MRI. A chronic left cerebellar infarct in the PICA territory is new from 2015. Small foci of T2 hyperintensity scattered throughout the  cerebral white matter bilaterally and in the brainstem are similar to the prior MRI and nonspecific but compatible with chronic small vessel ischemia, mild for age. There is mild global cerebral atrophy. No intracranial hemorrhage, midline shift, or extra-axial fluid collection is seen. Chronic small vessel ischemic changes are present in the right greater than left thalami. A small extra-axial mass over the left frontoparietal convexity measures 14 x 7 mm, not significantly changed from the prior MRI and without significant associated mass effect or evidence of associated edema. Vascular: New small focus of susceptibility artifact associated with an MCA branch vessel in the right sylvian fissure with abnormal FLAIR hyperintensity in more distal MCA branch vessels, more fully evaluated below. Similar findings involving the left PCA. Skull and upper cervical spine: Unremarkable bone marrow signal. Sinuses/Orbits: Prior bilateral cataract extraction. Mild mucosal thickening in the paranasal sinuses. Small volume  right sphenoid sinus fluid. Clear mastoid air cells. Other: None. MRA HEAD FINDINGS The visualized distal vertebral arteries are widely patent to the basilar with the right being dominant. Right PICA and left AICA are again not clearly identified. Left PICA and right AICA origins are patent. SCA origins are patent. Basilar artery is widely patent. Posterior communicating arteries are not identified. Right PCA is patent without significant proximal stenosis. There is occlusion of the left PCA at the level of the P1-P2 junction, new from 2015. The internal carotid arteries are widely patent from skullbase to carotid termini. The ACAs and lift MCA are patent without evidence of significant proximal stenosis or major branch occlusion. The right M1 segment and MCA bifurcation/ trifurcation are widely patent, however the there is occlusion of a mid right M2 middle division branch vessel in the sylvian fissure with  decreased flow related enhancement of more distal branches in this distribution. No intracranial aneurysm is identified. IMPRESSION: 1. Right mid M2 branch vessel embolic occlusion with small region of acute infarction in the corona radiata/centrum semiovale. 2. Small acute embolic or watershed infarcts in the high left frontal lobe and lateral right occipital lobe. 3. Restricted diffusion in the left hippocampus. This is also likely an acute infarct given the other coexistent findings, however this appearance can also be a transient finding without chronic sequelae. 4. New proximal left PCA occlusion, also likely embolic though without an associated acute or chronic large infarct in this territory. 5. Unchanged small left lateral convexity meningioma. 6. 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 Electronically Signed   By: Logan Bores M.D.   On: 08/18/2016 13:14   Mr Jodene Nam Head/brain [id number] Cm  Result Date: 08/18/2016 CLINICAL DATA:  Dysarthria and facial droop.  Prior stroke. EXAM: MRI HEAD WITHOUT CONTRAST MRA HEAD WITHOUT CONTRAST TECHNIQUE: Multiplanar, multiecho pulse sequences of the brain and surrounding structures were obtained without intravenous contrast. Angiographic images of the head were obtained using MRA technique without contrast. COMPARISON:  Head CT 08/18/2016.  Head MRI and MRA 10/05/2013. FINDINGS: MRI HEAD FINDINGS Brain: There is a small region of patchy/hazy increased trace diffusion signal with slightly reduced ADC in the right corona radiata and centrum semiovale. A few discrete subcentimeter foci of restricted diffusion are present in the subcortical white matter of the left superior frontal gyrus near the vertex as well as involving the lateral right occipital lobe cortex and may reflect left ACA/MCA and right MCA/ PCA watershed infarcts, respectively. There is also a small region of restricted diffusion in the mesial left temporal lobe at the level of the hippocampal  head. A chronic right temporal-occipital infarct is unchanged from the prior MRI. A chronic left cerebellar infarct in the PICA territory is new from 2015. Small foci of T2 hyperintensity scattered throughout the cerebral white matter bilaterally and in the brainstem are similar to the prior MRI and nonspecific but compatible with chronic small vessel ischemia, mild for age. There is mild global cerebral atrophy. No intracranial hemorrhage, midline shift, or extra-axial fluid collection is seen. Chronic small vessel ischemic changes are present in the right greater than left thalami. A small extra-axial mass over the left frontoparietal convexity measures 14 x 7 mm, not significantly changed from the prior MRI and without significant associated mass effect or evidence of associated edema. Vascular: New small focus of susceptibility artifact associated with an MCA branch vessel in the right sylvian fissure with abnormal FLAIR hyperintensity in more distal MCA branch vessels, more fully evaluated  below. Similar findings involving the left PCA. Skull and upper cervical spine: Unremarkable bone marrow signal. Sinuses/Orbits: Prior bilateral cataract extraction. Mild mucosal thickening in the paranasal sinuses. Small volume right sphenoid sinus fluid. Clear mastoid air cells. Other: None. MRA HEAD FINDINGS The visualized distal vertebral arteries are widely patent to the basilar with the right being dominant. Right PICA and left AICA are again not clearly identified. Left PICA and right AICA origins are patent. SCA origins are patent. Basilar artery is widely patent. Posterior communicating arteries are not identified. Right PCA is patent without significant proximal stenosis. There is occlusion of the left PCA at the level of the P1-P2 junction, new from 2015. The internal carotid arteries are widely patent from skullbase to carotid termini. The ACAs and lift MCA are patent without evidence of significant proximal  stenosis or major branch occlusion. The right M1 segment and MCA bifurcation/ trifurcation are widely patent, however the there is occlusion of a mid right M2 middle division branch vessel in the sylvian fissure with decreased flow related enhancement of more distal branches in this distribution. No intracranial aneurysm is identified. IMPRESSION: 1. Right mid M2 branch vessel embolic occlusion with small region of acute infarction in the corona radiata/centrum semiovale. 2. Small acute embolic or watershed infarcts in the high left frontal lobe and lateral right occipital lobe. 3. Restricted diffusion in the left hippocampus. This is also likely an acute infarct given the other coexistent findings, however this appearance can also be a transient finding without chronic sequelae. 4. New proximal left PCA occlusion, also likely embolic though without an associated acute or chronic large infarct in this territory. 5. Unchanged small left lateral convexity meningioma. 6. 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 Electronically Signed   By: Logan Bores M.D.   On: 08/18/2016 13:14   Ct Head Code Stroke W/o Cm  Result Date: 08/18/2016 CLINICAL DATA:  Code stroke. Code stroke. Left upper extremity weakness. Left-sided facial droop. EXAM: CT HEAD WITHOUT CONTRAST TECHNIQUE: Contiguous axial images were obtained from the base of the skull through the vertex without intravenous contrast. COMPARISON:  MRI brain 10/05/2013. CT head without contrast 10/04/2013. FINDINGS: Brain: An inferior left cerebellar infarct is new since the prior study, but not acute. A remote posterior right temporal and occipital lobe infarct is stable. Right basal ganglia are intact. No acute cortical infarct is present. The insular ribbon is within normal limits. The brainstem and cerebellum are otherwise unremarkable. A left frontal meningioma is stable. Atrophy and white matter disease is stable. Vascular: Atherosclerotic  calcifications are present within the cavernous internal carotid arteries bilaterally. There is no hyperdense vessel. Skull: The calvarium is intact. No significant extracranial soft tissue lesion is present. Sinuses/Orbits: Circumferential mucosal thickening is present in the maxillary sinuses bilaterally. There is mucosal thickening within the anterior right ethmoid air cells and inferior right frontal sinus. A fluid level is present within the right sphenoidal recess. There is mild mucosal thickening posteriorly in the right sphenoid sinus. The left sphenoid sinus is clear. The mastoid air cells are clear. ASPECTS [facility] Stroke Program Early CT Score) - Ganglionic level infarction (caudate, lentiform nuclei, internal capsule, insula, M1-M3 cortex): 7/7 - Supraganglionic infarction (M4-M6 cortex): 3/3 Total score (0-10 with 10 being normal): 10/10 IMPRESSION: 1. No acute intracranial abnormality. 2. Interval infarct within the inferior left cerebellum does not appear acute. 3. Stable remote posterior right temporal and occipital lobe infarct. 4. Stable atrophy and white matter disease. 5. ASPECTS is 10/10  These results were called by telephone at the time of interpretation on 08/18/2016 at 10:48 am to Dr. Lawana Pai, who verbally acknowledged these results. Electronically Signed   By: San Morelle M.D.   On: 08/18/2016 10:49        Scheduled Meds: .  stroke: mapping our early stages of recovery book   Does not apply Once  . aspirin  325 mg Oral Daily  . atenolol  50 mg Oral Daily  . clopidogrel  75 mg Oral Q breakfast  . enoxaparin (LOVENOX) injection  40 mg Subcutaneous Q24H  . pantoprazole  40 mg Oral Daily  . simvastatin  40 mg Oral q1800   Continuous Infusions:   LOS: 0 days    Time spent: 1    OSEI-BONSU,Christopherjohn Schiele, MD [facility] Pager [phone]  If 7PM-7AM, please contact night-coverage [email or web address] Password [facility] 08/19/2016, 5:37 PM

## 2016-08-19 NOTE — Evaluation (Addendum)
Occupational Therapy Evaluation Patient Details Name: Christine Middleton MRN: [id number] DOB: 05/31/1923 Today's Date: 08/19/2016    History of Present Illness 83 yo female admitted with L facial droop and abnormal speech. MRI (+) R M2 branch vessel embolic occlusion with small region of acute infarct in the corona radiata / centrum semiovale. Small acute embolic or watershed infarcts in the high left frontal lobe and lateral R occipital lobe.    Clinical Impression   PT admitted with L frontal and R occipital lobe watershed infarct. Pt currently with functional limitiations due to the deficits listed below (see OT problem list). PTA was independent with all adls including driving. Pt plans to drive 3 more years because she has 3 years left on her license until renewal.  Pt will benefit from skilled OT to increase their independence and safety with adls and balance to allow discharge home. Pt does complain of L LE weakness during movement.      Follow Up Recommendations  No OT follow up    Equipment Recommendations  None recommended by OT    Recommendations for Other Services       Precautions / Restrictions Precautions Precautions: Fall Precaution Comments: fall 2 years ago outside working in the yard      Mobility Bed Mobility               General bed mobility comments: in chair   Transfers Overall transfer level: Needs assistance   Transfers: Sit to/from Stand Sit to Stand: Min guard         General transfer comment: pushing up with bil UE    Balance                                 Standardized Balance Assessment Standardized Balance Assessment : Dynamic Gait Index   Dynamic Gait Index Level Surface: Mild Impairment Change in Gait Speed: Mild Impairment Gait with Horizontal Head Turns: Mild Impairment Gait with Vertical Head Turns: Mild Impairment Gait and Pivot Turn: Normal Step Over Obstacle: Moderate Impairment      ADL Overall  ADL's : Modified independent                                       General ADL Comments: able to open all containers self feeding, completed toilet transfer with peri care, and balance assessment     Vision  Vision assessment : noted extra eye jumps with scanning Pt reports Friday with L eye having a bright light in the corner but resolved at this time.  Pt using central vision throughout session. Pt able to read clock and problem solve time different speaking with daughter in [place]   Perception     Praxis      Pertinent Vitals/Pain Pain Assessment: No/denies pain     Hand Dominance Right   Extremity/Trunk Assessment Upper Extremity Assessment Upper Extremity Assessment: Overall WFL for tasks assessed   Lower Extremity Assessment Lower Extremity Assessment: LLE deficits/detail LLE Deficits / Details: limping saying "its tired"    Cervical / Trunk Assessment Cervical / Trunk Assessment: Normal   Communication Communication Communication: No difficulties   Cognition Arousal/Alertness: Awake/alert Behavior During Therapy: WFL for tasks assessed/performed                       General  Comments       Exercises       Shoulder Instructions      Home Living Family/patient expects to be discharged to:: Private residence Living Arrangements: Spouse/significant other;Children Available Help at Discharge: Family;Available PRN/intermittently Type of Home: House Home Access: Ramped entrance     Home Layout: Multi-level     Bathroom Shower/Tub: Tub/shower unit;Walk-in shower (uses tub shower more)   Bathroom Toilet: Standard     Home Equipment: Grab bars - toilet;Grab bars - tub/shower;Hand held shower head   Additional Comments: normal takes care of spouse with dementia that is bed bound, sewing room in the attic and full basement but reports I am not allowed there anymore drives regularly - "says she has to hold up 3 more years because  she has 3 more years on her license" - no help with spouse currently  Lives With: Spouse    Prior Functioning/Environment Level of Independence: Independent                 OT Problem List: Impaired vision/perception;Decreased safety awareness;Decreased activity tolerance   OT Treatment/Interventions: Self-care/ADL training;Visual/perceptual remediation/compensation;Balance training;Patient/family education    OT Goals(Current goals can be found in the care plan section) Acute Rehab OT Goals Patient Stated Goal: to go home and take care of spouse OT Goal Formulation: With patient Time For Goal Achievement: 09/02/16 Potential to Achieve Goals: Good  OT Frequency: Min 2X/week   Barriers to D/C:            Co-evaluation              End of Session Equipment Utilized During Treatment: Gait belt Nurse Communication: Mobility status;Precautions  Activity Tolerance: Patient tolerated treatment well Patient left: in chair;with call bell/phone within reach   Time: 0855-0923 OT Time Calculation (min): 28 min Charges:  OT General Charges $OT Visit: 1 Procedure OT Evaluation $OT Eval Moderate Complexity: 1 Procedure OT Treatments $Self Care/Home Management : 8-22 mins G-Codes:    Parke Poisson B Aug 22, 2016, 9:51 AM  Jeri Modena   OTR/L [phone] Office: [phone] .

## 2016-08-20 DIAGNOSIS — I63439 Cerebral infarction due to embolism of unspecified posterior cerebral artery: Secondary | ICD-10-CM

## 2016-08-20 DIAGNOSIS — I639 Cerebral infarction, unspecified: Secondary | ICD-10-CM

## 2016-08-20 LAB — URINALYSIS, COMPLETE (UACMP) WITH MICROSCOPIC
Bilirubin Urine: NEGATIVE
Glucose, UA: NEGATIVE mg/dL
Hgb urine dipstick: NEGATIVE
Ketones, ur: NEGATIVE mg/dL
Nitrite: NEGATIVE
Protein, ur: NEGATIVE mg/dL
Specific Gravity, Urine: 1.016 (ref 1.005–1.030)
pH: 5 (ref 5.0–8.0)

## 2016-08-20 LAB — HEMOGLOBIN A1C
Hgb A1c MFr Bld: 5.8 % — ABNORMAL HIGH (ref 4.8–5.6)
Mean Plasma Glucose: 120 mg/dL

## 2016-08-20 MED ORDER — DIVALPROEX SODIUM ER 250 MG PO TB24
250.0000 mg | ORAL_TABLET | Freq: Two times a day (BID) | ORAL | Status: DC
  Filled 2016-08-20: qty 1

## 2016-08-20 MED ORDER — DIVALPROEX SODIUM ER 250 MG PO TB24
250.0000 mg | ORAL_TABLET | Freq: Two times a day (BID) | ORAL | Status: DC
  Administered 2016-08-20 – 2016-08-21 (×2): 250 mg via ORAL
  Filled 2016-08-20 (×2): qty 1

## 2016-08-20 MED ORDER — VALPROATE SODIUM 500 MG/5ML IV SOLN
1000.0000 mg | Freq: Once | INTRAVENOUS | Status: AC
  Administered 2016-08-20: 1000 mg via INTRAVENOUS
  Filled 2016-08-20: qty 10

## 2016-08-20 NOTE — Care Management Note (Signed)
Case Management Note  Patient Details  Name: Christine Middleton MRN: [id number] Date of Birth: Mar 13, 1923  Subjective/Objective:                  L facial droop and abnormal speech Action/Plan: Discharge planning Expected Discharge Date:                  Expected Discharge Plan:  [place]  In-House Referral:     Discharge planning Services  CM Consult  Post Acute Care Choice:  Home Health Choice offered to:  Patient  DME Arranged:  N/A DME Agency:  NA  HH Arranged:  Patient Refused [facility] Agency:  NA  Status of Service:  Completed, signed off  If discussed at [employer] of Stay Meetings, dates discussed:    Additional Comments: CM spoke with pt to offer choice of home health agency.  Pt statesw she lives with her husband who is bedridden and helps to take care of him.  Pt states she receives help from her son.  Pt declines all [place] services as she states she will be fine once she gets home. Pt states she has all the DME needed at home.  No other CM needs were communicated. Dellie Catholic, RN 08/20/2016, 10:24 AM

## 2016-08-20 NOTE — Progress Notes (Signed)
STROKE TEAM PROGRESS NOTE   HISTORY OF PRESENT ILLNESS (per record) Christine Middleton is an 83 y.o. female LKW possibly at 38 am per son who noticed left face was "drawn".  Patient herself thought her speech may have been off at one point but not sure when.  She said she was not feeling well when she woke up this morning.  She was caring for her husband as usual.  She feels essentially back to baseline now.  She does have a prior stroke of the right hemisphere that may cause residual left sided symptoms.  She is already on Plavix and claims to be compliant.   SUBJECTIVE (INTERVAL HISTORY) No family members present. I was called by Dr. Doristine Section Bonsu to evaluate patient for recurrent episodes of transient vision disturbances with mild headache. The patient states she's had similar episodes in the past but not as frequently. For the last 3 days she's been having these episodes occur lasting minutes when she can see only parts of objects and at times she can see pattern suggesting across her field of vision. She's also had a mild headache. She had somewhat similar but milder episodes in the remote past and was told that these were visual migraines   OBJECTIVE Temp:  [97.7 F (36.5 C)-98.4 F (36.9 C)] 98.2 F (36.8 C) (01/21 0928) Pulse Rate:  [67-78] 67 (01/21 0928) Cardiac Rhythm: Normal sinus rhythm (01/21 0700) Resp:  [18-20] 18 (01/21 0928) BP: (117-156)/(51-72) 156/69 (01/21 0928) SpO2:  [94 %-98 %] 94 % (01/21 0928)  CBC:   Recent Labs Lab 08/18/16 1033 08/18/16 1038  WBC 7.2  --   NEUTROABS 4.8  --   HGB 12.1 11.6*  HCT 35.7* 34.0*  MCV 90.8  --   PLT 206  --     Basic Metabolic Panel:   Recent Labs Lab 08/18/16 1033 08/18/16 1038  NA 140 140  K 3.7 3.5  CL 105 105  CO2 24  --   GLUCOSE 105* 112*  BUN 19 20  CREATININE 0.99 0.90  CALCIUM 9.0  --     Lipid Panel:     Component Value Date/Time   CHOL 168 08/19/2016 0332   TRIG 128 08/19/2016 0332   TRIG 92  05/28/2006 1104   HDL 66 08/19/2016 0332   CHOLHDL 2.5 08/19/2016 0332   VLDL 26 08/19/2016 0332   LDLCALC 76 08/19/2016 0332   HgbA1c:  Lab Results  Component Value Date   HGBA1C 6.1 07/28/2014   Urine Drug Screen:     Component Value Date/Time   LABOPIA NONE DETECTED 10/04/2013 2030   COCAINSCRNUR NONE DETECTED 10/04/2013 2030   LABBENZ NONE DETECTED 10/04/2013 2030   AMPHETMU NONE DETECTED 10/04/2013 2030   THCU NONE DETECTED 10/04/2013 2030   LABBARB NONE DETECTED 10/04/2013 2030      IMAGING   Mr Jodene Nam Head/brain Wo Cm 08/18/2016 1. Right mid M2 branch vessel embolic occlusion with small region of acute infarction in the corona radiata/centrum semiovale.  2. Small acute embolic or watershed infarcts in the high left frontal lobe and lateral right occipital lobe.  3. Restricted diffusion in the left hippocampus. This is also likely an acute infarct given the other coexistent findings, however this appearance can also be a transient finding without chronic sequelae.  4. New proximal left PCA occlusion, also likely embolic though without an associated acute or chronic large infarct in this territory.  5. Unchanged small left lateral convexity meningioma.  6. Chronic  right temporal-occipital and left cerebellar infarcts.      Ct Head Code Stroke W/o Cm 08/18/2016 1. No acute intracranial abnormality.  2. Interval infarct within the inferior left cerebellum does not appear acute.  3. Stable remote posterior right temporal and occipital lobe infarct.  4. Stable atrophy and white matter disease.  5. ASPECTS is 10/10     Transthoracic echocardiogram 08/18/2016 EF 60-65%. No cardiac source of emboli identified.     PHYSICAL EXAM Pleasant frail elderly Caucasian lady currently not in distress. . Afebrile. Head is nontraumatic. Neck is supple without bruit.    Cardiac exam no murmur or gallop. Lungs are clear to auscultation. Distal pulses are well felt. Neurological Exam  ;  Awake  Alert oriented x 3. Normal speech and language.eye movements full without nystagmus.fundi were not visualized. Vision acuity and fields appear normal. Hearing is normal. Palatal movements are normal. Face symmetric. Tongue midline. Normal strength, tone, reflexes and coordination. Normal sensation. Gait deferred.     ASSESSMENT/PLAN [name] is a 83 y.o. female with history of previous strokes, hypertension, and hyperlipidemia presenting with left facial droop and speech difficulties.  She did not receive IV t-PA due to improvement in deficits.  Strokes:  Bilateral acute and chronic strokes felt to be embolic from an unknown source.  Resultant  No deficits  MRI - multiple bilateral acute and chronic strokes felt to be embolic.  MRA - new proximal left PCA occlusion.  Carotid Doppler - carotid arteries - bilateral 1- 39 percent stenosis. The vertebral arteries patent with antegrade flow.  2D Echo - EF 60 - 65%. No cardiac source of emboli identified.  LDL - 76  HgbA1c - pending  VTE prophylaxis - Lovenox Diet Heart Room service appropriate? Yes; Fluid consistency: Thin Diet NPO time specified Except for: Sips with Meds  clopidogrel 75 mg daily prior to admission, now on clopidogrel 75 mg daily  Patient counseled to be compliant with her antithrombotic medications  Ongoing aggressive stroke risk factor management  Therapy recommendations:  pending  Disposition: Pending  Hypertension  Stable  Permissive hypertension (OK if < 220/120) but gradually normalize in 5-7 days  Long-term BP goal normotensive  Hyperlipidemia  Home meds:  Zocor 20 mg daily prior to admission.  LDL 76, goal < 70  Zocor increased to 40 mg daily.  Continue statin at discharge   Other Stroke Risk Factors  Advanced age  Hx stroke/TIA  Family hx stroke (sister)   Other Active Problems  Mild anemia - 11.6 / 34   PLAN  TEE and loop tentatively planned for  Monday.  IV Depacon 1 g 1 now followed by Depakote 250 twice a day. Check EEG for seizure activity.  Discuss with [facility] day # 1 I have personally examined this patient, reviewed notes, independently viewed imaging studies, participated in medical decision making and plan of care.ROS completed by me personally and pertinent positives fully documented  I have made any additions or clarifications directly to the above note. Patient has presented with facial droop secondary to small embolic left medial temporal infarct from left PCA occlusion likely of embolic etiology. Recommend ongoing stroke evaluation and consider checking TEE and loop recorder insertion on Monday. Continue clopidogrel for stroke prevention for now. Greater than 50% time during this 35 minute visit was spent on counseling and coordination of care about stroke risk, prevention and treatment discussion.  Antony Contras, MD Medical Director [facility] Pager: [phone] 08/20/2016 11:27 AM   To contact Stroke Continuity provider, please refer to [email or web address] After hours, contact General Neurology

## 2016-08-20 NOTE — Progress Notes (Signed)
PROGRESS NOTE  Christine Middleton  [id number] DOB: 07/03/1923  DOA: 08/18/2016 PCP: [name], MD   Brief Narrative:  Ms Christine Middleton is a 83 year old lady with previous history of stroke and hypertension as well as hyperlipidemia who presented with one-day history of left facial droop and dysarthria on 08/18/2006. Last time that she was noted to be in her usual state of health was several hours prior to presentation and was out of TPA window. Code stroke was rescinded. She had a negative CT scan of the head as well as carotid duplex scan.  Assessment & Plan:   Principal Problem:   Facial droop Active Problems:   HLD (hyperlipidemia)   Anxiety state   Essential hypertension   GERD   Constipation   Polymyalgia rheumatica (HCC)   Embolic stroke (HCC)   Cerebrovascular accident (CVA) ([place])   1. Embolic CVA: 2-D echo [id number] - EF 60-65%, no regional wall motion abnormality. Grade 2 diastolic dysfunction. No cardiac source of emboli. Carotid Doppler 08/18/2016 - negative. MRI/A of the brain 08/19/2016 was indicative of embolic stroke with new left PCA Occlussion. Concerns for possible atrial fibrillation as etiology - cardiology consulted for TEE and loop recorder - she declines TEE now and would consider Loop Recorder in the future. Secondary preventative measures instituted.  Target LDL 70 - dose of Statin increased, EP f/u planned per Cardiology.  2. Possible Seizure: Depacon initiated per Dr Mamie Nick. Check Depakote Levels with hematological Indices.   DVT prophylaxis: Lovenox Code Status: DO NOT RESUSCITATE Family Communication: Patient does not wish for family communication. Disposition Plan: Pending   Consultants:  Neurology - Dr. Antony Contras Cardiology - Dr. Tomasita Crumble  Procedures:  EEG pending  Antimicrobials:  None   Subjective: She c/o recurrent visual disturbance with blurring this morning otherwise -  D/w Dr Mamie Nick, concerned about  Sz,   Objective:  Vitals:   08/20/16 0551 08/20/16 0928 08/20/16 1328 08/20/16 1737  BP: 127/72 (!) 156/69 (!) 127/55 (!) 149/66  Pulse: 75 67 73 72  Resp: 20 18 18 18   Temp: 98.1 F (36.7 C) 98.2 F (36.8 C) 97.9 F (36.6 C) 98.5 F (36.9 C)  TempSrc: Oral Oral Oral Oral  SpO2: 96% 94% 100% 98%  Weight:      Height:        Intake/Output Summary (Last 24 hours) at 08/20/16 1839 Last data filed at 08/20/16 0800  Gross per 24 hour  Intake              350 ml  Output                0 ml  Net              350 ml   Filed Weights   08/18/16 1000 08/18/16 1059  Weight: 66 kg (145 lb 8.1 oz) 66 kg (145 lb 8.1 oz)    Examination:  General exam: Resting comfortably, looks younger than his stated age of 66 years Respiratory system: Clear to auscultation. Respiratory effort normal. Cardiovascular system: S1 & S2 heard, RRR. No JVD, murmurs, rubs, gallops or clicks. No pedal edema. Gastrointestinal system: Abdomen is nondistended, soft and nontender. No organomegaly or masses felt. Normal bowel sounds heard. Central nervous system: Alert and oriented. Minimal left facial droop otherwise no obvious focal neurological deficits. Extremities: Symmetric 5 x 5 power.  Psychiatry: Judgement and insight appear normal. Mood & affect appropriate.     Data Reviewed: I have personally reviewed following labs  and imaging studies  CBC:  Recent Labs Lab 08/18/16 1033 08/18/16 1038  WBC 7.2  --   NEUTROABS 4.8  --   HGB 12.1 11.6*  HCT 35.7* 34.0*  MCV 90.8  --   PLT 206  --    Basic Metabolic Panel:  Recent Labs Lab 08/18/16 1033 08/18/16 1038  NA 140 140  K 3.7 3.5  CL 105 105  CO2 24  --   GLUCOSE 105* 112*  BUN 19 20  CREATININE 0.99 0.90  CALCIUM 9.0  --    GFR: Estimated Creatinine Clearance: 36.4 mL/min (by C-G formula based on SCr of 0.9 mg/dL). Liver Function Tests:  Recent Labs Lab 08/18/16 1033  AST 22  ALT 12*  ALKPHOS 84  BILITOT 0.8  PROT 6.3*   ALBUMIN 3.6   No results for input(s): LIPASE, AMYLASE in the last 168 hours. No results for input(s): AMMONIA in the last 168 hours. Coagulation Profile:  Recent Labs Lab 08/18/16 1033  INR 1.04   Cardiac Enzymes: No results for input(s): CKTOTAL, CKMB, CKMBINDEX, TROPONINI in the last 168 hours. BNP (last 3 results) No results for input(s): PROBNP in the last 8760 hours. HbA1C:  Recent Labs  08/19/16 0330  HGBA1C 5.8*   CBG: No results for input(s): GLUCAP in the last 168 hours. Lipid Profile:  Recent Labs  08/19/16 0332  CHOL 168  HDL 66  LDLCALC 76  TRIG 128  CHOLHDL 2.5   Thyroid Function Tests: No results for input(s): TSH, T4TOTAL, FREET4, T3FREE, THYROIDAB in the last 72 hours. Anemia Panel: No results for input(s): VITAMINB12, FOLATE, FERRITIN, TIBC, IRON, RETICCTPCT in the last 72 hours.  Sepsis Labs:  Recent Labs Lab 08/18/16 1033  WBC 7.2    No results found for this or any previous visit (from the past 240 hour(s)).       Radiology Studies: No results found.      Scheduled Meds: .  stroke: mapping our early stages of recovery book   Does not apply Once  . aspirin  325 mg Oral Daily  . atenolol  50 mg Oral Daily  . clopidogrel  75 mg Oral Q breakfast  . divalproex  250 mg Oral BID  . enoxaparin (LOVENOX) injection  40 mg Subcutaneous Q24H  . pantoprazole  40 mg Oral Daily  . simvastatin  40 mg Oral q1800   Continuous Infusions:   LOS: 1 day    Time spent: 29    OSEI-BONSU,Cornelia Walraven, MD [facility] Pager [phone]  If 7PM-7AM, please contact night-coverage [email or web address] Password [facility] 08/20/2016, 6:39 PM

## 2016-08-20 NOTE — Progress Notes (Signed)
ELECTROPHYSIOLOGY CONSULT NOTE  Patient ID: Christine Middleton MRN: [id number], DOB/AGE: 1922/10/28   Admit date: 08/18/2016 Date of Consult: 08/20/2016  Primary Physician: [name], MD  Reason for Consultation: Cryptogenic stroke; recommendations regarding Implantable Loop Recorder  History of Present Illness Christine Middleton was admitted on 08/18/2016 with acute CVA. she has been monitored on telemetry which has demonstrated no arrhythmias. No cause has been identified.  She is making slow recovery.  EP has been asked to evaluate for placement of an implantable loop recorder to monitor for atrial fibrillation.  Past Medical History Past Medical History:  Diagnosis Date  . Diverticulitis   . Esophageal stricture   . GERD (gastroesophageal reflux disease)   . Hyperlipidemia   . Hypertension   . Shingles   . Stroke ([place])   . Vitamin B 12 deficiency     Past Surgical History Past Surgical History:  Procedure Laterality Date  . ABDOMINAL HYSTERECTOMY    . APPENDECTOMY    . COLONOSCOPY  12-09-08   per Dr. Sharlett Iles (incomplete along with barium enema 12-10-08) with hemorrhoids  and diverticulae  . ESOPHAGOGASTRODUODENOSCOPY  09-2004   per Dr. Sharlett Iles with dilatation   . EYE SURGERY    . TONSILLECTOMY      Allergies/Intolerances Allergies  Allergen Reactions  . Amoxicillin     REACTION: rash  . Codeine   . Lisinopril   . Nitrofurantoin    Inpatient Medications .  stroke: mapping our early stages of recovery book   Does not apply Once  . aspirin  325 mg Oral Daily  . atenolol  50 mg Oral Daily  . clopidogrel  75 mg Oral Q breakfast  . divalproex  250 mg Oral BID  . enoxaparin (LOVENOX) injection  40 mg Subcutaneous Q24H  . pantoprazole  40 mg Oral Daily  . simvastatin  40 mg Oral q1800    Social History Social History   Social History  . Marital status: Married    Spouse name: Grayling Congress. Venus  . Number of children: 4  . Years of education: HS   Occupational  History  . Retired Retired   Social History Main Topics  . Smoking status: Never Smoker  . Smokeless tobacco: Never Used  . Alcohol use No  . Drug use: No  . Sexual activity: Not on file   Other Topics Concern  . Not on file   Social History Narrative   Patient lives at home with family.   Patient is right handed.   Patient has a high school education.   Caffeine Use: 1 cup daily    Review of Systems General: No chills, fever, night sweats or weight changes  Cardiovascular:  No chest pain, dyspnea on exertion, edema, orthopnea, palpitations, paroxysmal nocturnal dyspnea Dermatological: No rash, lesions or masses Respiratory: No cough, dyspnea Urologic: No hematuria, dysuria Abdominal: No nausea, vomiting, diarrhea, bright red blood per rectum, melena, or hematemesis Neurologic: No visual changes, weakness, changes in mental status All other systems reviewed and are otherwise negative except as noted above.  Physical Exam Blood pressure (!) 127/55, pulse 73, temperature 97.9 F (36.6 C), temperature source Oral, resp. rate 18, height 5\' 3"  (1.6 m), weight 145 lb 8.1 oz (66 kg), SpO2 100 %.  General: Well developed, well appearing 83 y.o. female in no acute distress. HEENT: Normocephalic, atraumatic. EOMs intact. Sclera nonicteric. Oropharynx clear.  Neck: Supple without bruits. No JVD. Lungs: Respirations regular and unlabored, CTA bilaterally. No wheezes, rales or  rhonchi. Heart: RRR. S1, S2 present. No murmurs, rub, S3 or S4. Abdomen: Soft, non-tender, non-distended. BS present x 4 quadrants. No hepatosplenomegaly.  Extremities: No clubbing, cyanosis or edema. DP/PT/Radials 2+ and equal bilaterally. Psych: Normal affect. Neuro: Alert and oriented X 3. Moves all extremities spontaneously. Musculoskeletal: No kyphosis. Skin: Intact. Warm and dry. No rashes or petechiae in exposed areas.   Labs Lab Results  Component Value Date   WBC 7.2 08/18/2016   HGB 11.6 (L)  08/18/2016   HCT 34.0 (L) 08/18/2016   MCV 90.8 08/18/2016   PLT 206 08/18/2016    Recent Labs Lab 08/18/16 1033 08/18/16 1038  NA 140 140  K 3.7 3.5  CL 105 105  CO2 24  --   BUN 19 20  CREATININE 0.99 0.90  CALCIUM 9.0  --   PROT 6.3*  --   BILITOT 0.8  --   ALKPHOS 84  --   ALT 12*  --   AST 22  --   GLUCOSE 105* 112*    Recent Labs  08/18/16 1033  INR 1.04    Radiology/Studies Mr Brain Wo Contrast  Result Date: 08/18/2016 CLINICAL DATA:  Dysarthria and facial droop.  Prior stroke. EXAM: MRI HEAD WITHOUT CONTRAST MRA HEAD WITHOUT CONTRAST TECHNIQUE: Multiplanar, multiecho pulse sequences of the brain and surrounding structures were obtained without intravenous contrast. Angiographic images of the head were obtained using MRA technique without contrast. COMPARISON:  Head CT 08/18/2016.  Head MRI and MRA 10/05/2013. FINDINGS: MRI HEAD FINDINGS Brain: There is a small region of patchy/hazy increased trace diffusion signal with slightly reduced ADC in the right corona radiata and centrum semiovale. A few discrete subcentimeter foci of restricted diffusion are present in the subcortical white matter of the left superior frontal gyrus near the vertex as well as involving the lateral right occipital lobe cortex and may reflect left ACA/MCA and right MCA/ PCA watershed infarcts, respectively. There is also a small region of restricted diffusion in the mesial left temporal lobe at the level of the hippocampal head. A chronic right temporal-occipital infarct is unchanged from the prior MRI. A chronic left cerebellar infarct in the PICA territory is new from 2015. Small foci of T2 hyperintensity scattered throughout the cerebral white matter bilaterally and in the brainstem are similar to the prior MRI and nonspecific but compatible with chronic small vessel ischemia, mild for age. There is mild global cerebral atrophy. No intracranial hemorrhage, midline shift, or extra-axial fluid  collection is seen. Chronic small vessel ischemic changes are present in the right greater than left thalami. A small extra-axial mass over the left frontoparietal convexity measures 14 x 7 mm, not significantly changed from the prior MRI and without significant associated mass effect or evidence of associated edema. Vascular: New small focus of susceptibility artifact associated with an MCA branch vessel in the right sylvian fissure with abnormal FLAIR hyperintensity in more distal MCA branch vessels, more fully evaluated below. Similar findings involving the left PCA. Skull and upper cervical spine: Unremarkable bone marrow signal. Sinuses/Orbits: Prior bilateral cataract extraction. Mild mucosal thickening in the paranasal sinuses. Small volume right sphenoid sinus fluid. Clear mastoid air cells. Other: None. MRA HEAD FINDINGS The visualized distal vertebral arteries are widely patent to the basilar with the right being dominant. Right PICA and left AICA are again not clearly identified. Left PICA and right AICA origins are patent. SCA origins are patent. Basilar artery is widely patent. Posterior communicating arteries are not identified. Right PCA  is patent without significant proximal stenosis. There is occlusion of the left PCA at the level of the P1-P2 junction, new from 2015. The internal carotid arteries are widely patent from skullbase to carotid termini. The ACAs and lift MCA are patent without evidence of significant proximal stenosis or major branch occlusion. The right M1 segment and MCA bifurcation/ trifurcation are widely patent, however the there is occlusion of a mid right M2 middle division branch vessel in the sylvian fissure with decreased flow related enhancement of more distal branches in this distribution. No intracranial aneurysm is identified. IMPRESSION: 1. Right mid M2 branch vessel embolic occlusion with small region of acute infarction in the corona radiata/centrum semiovale. 2. Small  acute embolic or watershed infarcts in the high left frontal lobe and lateral right occipital lobe. 3. Restricted diffusion in the left hippocampus. This is also likely an acute infarct given the other coexistent findings, however this appearance can also be a transient finding without chronic sequelae. 4. New proximal left PCA occlusion, also likely embolic though without an associated acute or chronic large infarct in this territory. 5. Unchanged small left lateral convexity meningioma. 6. 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 Electronically Signed   By: Logan Bores M.D.   On: 08/18/2016 13:14   Mr Jodene Nam Head/brain [id number] Cm  Result Date: 08/18/2016 CLINICAL DATA:  Dysarthria and facial droop.  Prior stroke. EXAM: MRI HEAD WITHOUT CONTRAST MRA HEAD WITHOUT CONTRAST TECHNIQUE: Multiplanar, multiecho pulse sequences of the brain and surrounding structures were obtained without intravenous contrast. Angiographic images of the head were obtained using MRA technique without contrast. COMPARISON:  Head CT 08/18/2016.  Head MRI and MRA 10/05/2013. FINDINGS: MRI HEAD FINDINGS Brain: There is a small region of patchy/hazy increased trace diffusion signal with slightly reduced ADC in the right corona radiata and centrum semiovale. A few discrete subcentimeter foci of restricted diffusion are present in the subcortical white matter of the left superior frontal gyrus near the vertex as well as involving the lateral right occipital lobe cortex and may reflect left ACA/MCA and right MCA/ PCA watershed infarcts, respectively. There is also a small region of restricted diffusion in the mesial left temporal lobe at the level of the hippocampal head. A chronic right temporal-occipital infarct is unchanged from the prior MRI. A chronic left cerebellar infarct in the PICA territory is new from 2015. Small foci of T2 hyperintensity scattered throughout the cerebral white matter bilaterally and in the brainstem  are similar to the prior MRI and nonspecific but compatible with chronic small vessel ischemia, mild for age. There is mild global cerebral atrophy. No intracranial hemorrhage, midline shift, or extra-axial fluid collection is seen. Chronic small vessel ischemic changes are present in the right greater than left thalami. A small extra-axial mass over the left frontoparietal convexity measures 14 x 7 mm, not significantly changed from the prior MRI and without significant associated mass effect or evidence of associated edema. Vascular: New small focus of susceptibility artifact associated with an MCA branch vessel in the right sylvian fissure with abnormal FLAIR hyperintensity in more distal MCA branch vessels, more fully evaluated below. Similar findings involving the left PCA. Skull and upper cervical spine: Unremarkable bone marrow signal. Sinuses/Orbits: Prior bilateral cataract extraction. Mild mucosal thickening in the paranasal sinuses. Small volume right sphenoid sinus fluid. Clear mastoid air cells. Other: None. MRA HEAD FINDINGS The visualized distal vertebral arteries are widely patent to the basilar with the right being dominant. Right PICA and left AICA  are again not clearly identified. Left PICA and right AICA origins are patent. SCA origins are patent. Basilar artery is widely patent. Posterior communicating arteries are not identified. Right PCA is patent without significant proximal stenosis. There is occlusion of the left PCA at the level of the P1-P2 junction, new from 2015. The internal carotid arteries are widely patent from skullbase to carotid termini. The ACAs and lift MCA are patent without evidence of significant proximal stenosis or major branch occlusion. The right M1 segment and MCA bifurcation/ trifurcation are widely patent, however the there is occlusion of a mid right M2 middle division branch vessel in the sylvian fissure with decreased flow related enhancement of more distal  branches in this distribution. No intracranial aneurysm is identified. IMPRESSION: 1. Right mid M2 branch vessel embolic occlusion with small region of acute infarction in the corona radiata/centrum semiovale. 2. Small acute embolic or watershed infarcts in the high left frontal lobe and lateral right occipital lobe. 3. Restricted diffusion in the left hippocampus. This is also likely an acute infarct given the other coexistent findings, however this appearance can also be a transient finding without chronic sequelae. 4. New proximal left PCA occlusion, also likely embolic though without an associated acute or chronic large infarct in this territory. 5. Unchanged small left lateral convexity meningioma. 6. 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 Electronically Signed   By: Logan Bores M.D.   On: 08/18/2016 13:14   Ct Head Code Stroke W/o Cm  Result Date: 08/18/2016 CLINICAL DATA:  Code stroke. Code stroke. Left upper extremity weakness. Left-sided facial droop. EXAM: CT HEAD WITHOUT CONTRAST TECHNIQUE: Contiguous axial images were obtained from the base of the skull through the vertex without intravenous contrast. COMPARISON:  MRI brain 10/05/2013. CT head without contrast 10/04/2013. FINDINGS: Brain: An inferior left cerebellar infarct is new since the prior study, but not acute. A remote posterior right temporal and occipital lobe infarct is stable. Right basal ganglia are intact. No acute cortical infarct is present. The insular ribbon is within normal limits. The brainstem and cerebellum are otherwise unremarkable. A left frontal meningioma is stable. Atrophy and white matter disease is stable. Vascular: Atherosclerotic calcifications are present within the cavernous internal carotid arteries bilaterally. There is no hyperdense vessel. Skull: The calvarium is intact. No significant extracranial soft tissue lesion is present. Sinuses/Orbits: Circumferential mucosal thickening is  present in the maxillary sinuses bilaterally. There is mucosal thickening within the anterior right ethmoid air cells and inferior right frontal sinus. A fluid level is present within the right sphenoidal recess. There is mild mucosal thickening posteriorly in the right sphenoid sinus. The left sphenoid sinus is clear. The mastoid air cells are clear. ASPECTS [facility] Stroke Program Early CT Score) - Ganglionic level infarction (caudate, lentiform nuclei, internal capsule, insula, M1-M3 cortex): 7/7 - Supraganglionic infarction (M4-M6 cortex): 3/3 Total score (0-10 with 10 being normal): 10/10 IMPRESSION: 1. No acute intracranial abnormality. 2. Interval infarct within the inferior left cerebellum does not appear acute. 3. Stable remote posterior right temporal and occipital lobe infarct. 4. Stable atrophy and white matter disease. 5. ASPECTS is 10/10 These results were called by telephone at the time of interpretation on 08/18/2016 at 10:48 am to Dr. Lawana Pai, who verbally acknowledged these results. Electronically Signed   By: San Morelle M.D.   On: 08/18/2016 10:49    Echocardiogram  EF 60%, moderate MR  12-lead ECG sinus rhythm Telemetry sinus rhythm   Assessment and Plan 1. Cryptogenic stroke The  patient is making good recovery from her CV event.  I had a long discussion with the patient and her son.  She is very clear that she would prefer a conservative approach.  Given her advanced age, I think that this is best.  She would be a poor candidate for remote monitoring and anticoagulation. She is clear that she does not wish to have a TEE.  She will consider 30 day monitor.  If she decides to have a 30 day monitor placed, I think that this would be reasonable.  I would not recommend ILR.  Electrophysiology team to see as needed while here. Please call with questions.  Army Fossa 08/20/2016, 4:49 PM

## 2016-08-20 NOTE — Progress Notes (Signed)
Pt reports headache gone one hour after depacon given. States that vision is still the same

## 2016-08-21 ENCOUNTER — Inpatient Hospital Stay (HOSPITAL_COMMUNITY)
Admit: 2016-08-21 | Discharge: 2016-08-21 | Disposition: A | Discharge status: Home / Self Care | Payer: Medicare Other | Attending: Neurology | Admitting: Neurology

## 2016-08-21 ENCOUNTER — Encounter (HOSPITAL_COMMUNITY): Payer: Self-pay | Admitting: General Practice

## 2016-08-21 DIAGNOSIS — I6310 Cerebral infarction due to embolism of unspecified precerebral artery: Secondary | ICD-10-CM

## 2016-08-21 DIAGNOSIS — R29810 Facial weakness: Secondary | ICD-10-CM

## 2016-08-21 MED ORDER — ASPIRIN EC 81 MG PO TBEC: 81.0000 mg | DELAYED_RELEASE_TABLET | Freq: Every day | ORAL | 0 refills | Status: DC

## 2016-08-21 MED ORDER — DIVALPROEX SODIUM 125 MG PO DR TAB
125.0000 mg | DELAYED_RELEASE_TABLET | ORAL | Status: AC
  Administered 2016-08-21: 125 mg via ORAL
  Filled 2016-08-21: qty 1

## 2016-08-21 MED ORDER — DIVALPROEX SODIUM 250 MG PO DR TAB
375.0000 mg | DELAYED_RELEASE_TABLET | Freq: Two times a day (BID) | ORAL | Status: DC
  Administered 2016-08-21 – 2016-08-22 (×2): 375 mg via ORAL
  Filled 2016-08-21 (×3): qty 1

## 2016-08-21 NOTE — Progress Notes (Signed)
Physical Therapy Treatment Patient Details Name: Christine Middleton MRN: [id number] DOB: 1923/01/20 Today's Date: 08/21/2016    History of Present Illness 83 yo female admitted with L facial droop and abnormal speech. MRI (+) R M2 branch vessel embolic occlusion with small region of acute infarct in the corona radiata / centrum semiovale. Small acute embolic or watershed infarcts in the high left frontal lobe and lateral R occipital lobe.     PT Comments    Pt seems to be progressing well with tolerating ambulation and added LE strengthening exercises today. Pt stated, "I can use all the strength I can get to get ready to take care of my husband and son." Pt will do well at home with HHPT and intermittent supervision. Continue to progress LE strengthening exercises to help pt reach maximal functional independence.   Follow Up Recommendations  Home health PT;Supervision - Intermittent     Equipment Recommendations  None recommended by PT    Recommendations for Other Services       Precautions / Restrictions Precautions Precautions: Fall Restrictions Weight Bearing Restrictions: No    Mobility  Bed Mobility Overal bed mobility: Modified Independent             General bed mobility comments: pt didn't require and v/c for getting out of bed  Transfers Overall transfer level: Modified independent Equipment used: None Transfers: Sit to/from Stand Sit to Stand: Modified independent (Device/Increase time)         General transfer comment: pushes up with B UE's, no LOB  Ambulation/Gait Ambulation/Gait assistance: Supervision;Min guard Ambulation Distance (Feet): 250 Feet   Gait Pattern/deviations: Step-through pattern;Decreased stride length     General Gait Details: pt c/o of feeling woozy when going from sit to stand; min guard for safety at beginning of ambulation but progressed to supervision after pt reported dizziness was gone.    Stairs             Wheelchair Mobility    Modified Rankin (Stroke Patients Only) Modified Rankin (Stroke Patients Only) Pre-Morbid Rankin Score: No symptoms Modified Rankin: No significant disability     Balance Overall balance assessment: Needs assistance Sitting-balance support: Bilateral upper extremity supported;Feet unsupported Sitting balance-Leahy Scale: Good     Standing balance support: No upper extremity supported;Single extremity supported;During functional activity   Standing balance comment: pt able to stand without use of UE's but used single UE support during standing balance exercises.                     Cognition Arousal/Alertness: Awake/alert Behavior During Therapy: WFL for tasks assessed/performed Overall Cognitive Status: Impaired/Different from baseline Area of Impairment: Problem solving;Memory     Memory: Decreased short-term memory       Problem Solving: Slow processing General Comments: pt unable to recall room number and showed decreased ability to problem solve when making decision on which direction room was.  Able to follow commands with minimal cuing for sequencing.    Exercises General Exercises - Lower Extremity Long Arc Quad: AROM;Seated;Both;10 reps Hip ABduction/ADduction: Standing;AROM;Both;10 reps Hip Flexion/Marching: AROM;Both;10 reps;Standing Heel Raises: AROM;Both;10 reps;Standing    General Comments        Pertinent Vitals/Pain Pain Assessment: No/denies pain    Home Living                      Prior Function            PT Goals (current goals can now  be found in the care plan section) Acute Rehab PT Goals Patient Stated Goal: to go home and take care of spouse PT Goal Formulation: With patient Potential to Achieve Goals: Good Progress towards PT goals: Progressing toward goals    Frequency    Min 4X/week      PT Plan Current plan remains appropriate    Co-evaluation             End of Session  Equipment Utilized During Treatment: Gait belt Activity Tolerance: Patient tolerated treatment well Patient left: in bed;with nursing/sitter in room;with family/visitor present (nurse stated she will set bed alarm after distributing meds)     Time: [id number] PT Time Calculation (min) (ACUTE ONLY): 24 min  Charges:  $Gait Training: 8-22 mins $Therapeutic Exercise: 8-22 mins                    G Codes:      [facility] 09-20-2016, 2:23 PM Olena [place], [place] Pager [phone]

## 2016-08-21 NOTE — Procedures (Signed)
ELECTROENCEPHALOGRAM REPORT  Date of Study: 08/21/2016  Patient's Name: Christine Middleton MRN: [id number] Date of Birth: 09-06-1922  Referring Provider: [name], PA-C  Clinical History: This is a 83 year old woman with an episode where left side of face was drawn.  Medications: acetaminophen (TYLENOL) aspirin tablet   atenolol (TENORMIN) bisacodyl (DULCOLAX) clonazePAM (KLONOPIN)  clopidogrel (PLAVIX) divalproex (DEPAKOTE) enoxaparin (LOVENOX)  hydrALAZINE (APRESOLINE) pantoprazole (PROTONIX) simvastatin (ZOCOR)  Technical Summary: A multichannel digital EEG recording measured by the international 10-20 system with electrodes applied with paste and impedances below 5000 ohms performed in our laboratory with EKG monitoring in an awake and asleep patient.  Hyperventilation and photic stimulation were not performed.  The digital EEG was referentially recorded, reformatted, and digitally filtered in a variety of bipolar and referential montages for optimal display.    Description: The patient is awake and asleep during the recording.  During maximal wakefulness, there is a symmetric, medium voltage 9.5 Hz posterior dominant rhythm that attenuates with eye opening.  The record is symmetric.  During drowsiness and sleep, there is an increase in theta slowing of the background, at times with shifting asymmetry over the bilateral temporal regions. Vertex waves and central beta activity is seen during sleep. Hyperventilation and photic stimulation were not performed.  There were no epileptiform discharges or electrographic seizures seen.    EKG lead showed irregular rhythm, there is one instance of a 2-second pause seen.  Impression: This awake and asleep EEG is within normal limits for age. Note of abnormal EKG.  Clinical Correlation: A normal EEG does not exclude a clinical diagnosis of epilepsy. Clinical correlation is advised.   Ellouise Newer, M.D.

## 2016-08-21 NOTE — Progress Notes (Signed)
STROKE TEAM PROGRESS NOTE   HISTORY OF PRESENT ILLNESS (per record) Christine Middleton is an 83 y.o. female LKW possibly at 39 am per son who noticed left face was "drawn".  Patient herself thought her speech may have been off at one point but not sure when.  She said she was not feeling well when she woke up this morning.  She was caring for her husband as usual.  She feels essentially back to baseline now.  She does have a prior stroke of the right hemisphere that may cause residual left sided symptoms.  She is already on Plavix and claims to be compliant.   SUBJECTIVE (INTERVAL HISTORY) The patient continues to have visual disturbances possibly secondary to atypical migraine headaches.   OBJECTIVE Temp:  [97.6 F (36.4 C)-98.8 F (37.1 C)] 97.6 F (36.4 C) (01/22 1448) Pulse Rate:  [71-80] 76 (01/22 1448) Cardiac Rhythm: Atrial fibrillation;Other (Comment) (01/22 1312) Resp:  [18-20] 20 (01/22 1448) BP: (144-165)/(62-81) 156/81 (01/22 1448) SpO2:  [95 %-99 %] 99 % (01/22 1448)  CBC:   Recent Labs Lab 08/18/16 1033 08/18/16 1038  WBC 7.2  --   NEUTROABS 4.8  --   HGB 12.1 11.6*  HCT 35.7* 34.0*  MCV 90.8  --   PLT 206  --     Basic Metabolic Panel:   Recent Labs Lab 08/18/16 1033 08/18/16 1038  NA 140 140  K 3.7 3.5  CL 105 105  CO2 24  --   GLUCOSE 105* 112*  BUN 19 20  CREATININE 0.99 0.90  CALCIUM 9.0  --     Lipid Panel:     Component Value Date/Time   CHOL 168 08/19/2016 0332   TRIG 128 08/19/2016 0332   TRIG 92 05/28/2006 1104   HDL 66 08/19/2016 0332   CHOLHDL 2.5 08/19/2016 0332   VLDL 26 08/19/2016 0332   LDLCALC 76 08/19/2016 0332   HgbA1c:  Lab Results  Component Value Date   HGBA1C 5.8 (H) 08/19/2016   Urine Drug Screen:     Component Value Date/Time   LABOPIA NONE DETECTED 10/04/2013 2030   COCAINSCRNUR NONE DETECTED 10/04/2013 2030   LABBENZ NONE DETECTED 10/04/2013 2030   AMPHETMU NONE DETECTED 10/04/2013 2030   THCU NONE DETECTED  10/04/2013 2030   LABBARB NONE DETECTED 10/04/2013 2030      IMAGING   Mr Jodene Nam Head/brain Wo Cm 08/18/2016 1. Right mid M2 branch vessel embolic occlusion with small region of acute infarction in the corona radiata/centrum semiovale.  2. Small acute embolic or watershed infarcts in the high left frontal lobe and lateral right occipital lobe.  3. Restricted diffusion in the left hippocampus. This is also likely an acute infarct given the other coexistent findings, however this appearance can also be a transient finding without chronic sequelae.  4. New proximal left PCA occlusion, also likely embolic though without an associated acute or chronic large infarct in this territory.  5. Unchanged small left lateral convexity meningioma.  6. 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      Ct Head Code Stroke W/o Cm 08/18/2016 1. No acute intracranial abnormality.  2. Interval infarct within the inferior left cerebellum does not appear acute.  3. Stable remote posterior right temporal and occipital lobe infarct.  4. Stable atrophy and white matter disease.  5. ASPECTS is 10/10     Transthoracic echocardiogram 08/18/2016 EF 60-65%. No cardiac source of emboli identified.  EEG 08/20/2016 Impression: This awake and asleep EEG  is within normal limits for age. Note of abnormal EKG. Clinical Correlation: A normal EEG does not exclude a clinical diagnosis of epilepsy. Clinical correlation is advised.     PHYSICAL EXAM Pleasant frail elderly Caucasian lady currently not in distress. . Afebrile. Head is nontraumatic. Neck is supple without bruit.    Cardiac exam no murmur or gallop. Lungs are clear to auscultation. Distal pulses are well felt. Neurological Exam ;  Awake  Alert oriented x 3. Normal speech and language.eye movements full without nystagmus.fundi were not visualized. Vision acuity and fields appear normal. Hearing is normal. Palatal movements are normal. Face  symmetric. Tongue midline. Normal strength, tone, reflexes and coordination. Normal sensation. Gait deferred.     ASSESSMENT/PLAN [name] is a 83 y.o. female with history of previous strokes, hypertension, and hyperlipidemia presenting with left facial droop and speech difficulties.  She did not receive IV t-PA due to improvement in deficits.  Strokes:  Bilateral acute and chronic strokes felt to be embolic from an unknown source.  Resultant  No deficits  MRI - multiple bilateral acute and chronic strokes felt to be embolic.  MRA - new proximal left PCA occlusion.  EEG - as above - normal  Carotid Doppler - carotid arteries - bilateral 1- 39 percent stenosis. The vertebral arteries patent with antegrade flow.  2D Echo - EF 60 - 65%. No cardiac source of emboli identified.  LDL - 76  HgbA1c - 5.8  VTE prophylaxis - Lovenox Diet Heart Room service appropriate? Yes; Fluid consistency: Thin  clopidogrel 75 mg daily prior to admission, now on clopidogrel 75 mg daily  Patient counseled to be compliant with her antithrombotic medications  Ongoing aggressive stroke risk factor management  Therapy recommendations:  Home health PT recommended. No follow-up OT.  Disposition: Pending  Hypertension  Stable  Permissive hypertension (OK if < 220/120) but gradually normalize in 5-7 days  Long-term BP goal normotensive  Hyperlipidemia  Home meds:  Zocor 20 mg daily prior to admission.  LDL 76, goal < 70  Zocor increased to 40 mg daily.  Continue statin at discharge   Other Stroke Risk Factors  Advanced age  Hx stroke/TIA  Family hx stroke (sister)   Other Active Problems  Mild anemia - 11.6 / 34   PLAN  Patient declined TEE and loop. Possible 30 day monitor per Dr. Rayann Heman.  Depakote increased to 375 mg Q12 hours.  Discuss with [facility] day # 2   [name] PA-C [facility] Pager [phone] 08/21/2016, 4:08 PM  I have personally examined this patient, reviewed notes, independently viewed imaging studies, participated in medical decision making and plan of care.ROS completed by me personally and pertinent positives fully documented  I have made any additions or clarifications directly to the above note. Patient has presented with facial droop secondary to small embolic left medial temporal infarct from left PCA occlusion likely of embolic etiology. Patient has refused TEE and loop recorder insertion as she states she is too old to undergo this test. Continue clopidogrel for stroke prevention for now. Increase Depakote dose to 375 mg twice daily. Discussed with patient and family and answered questions. Greater than 50% time during this 25 minute visit was spent on counseling and coordination of care about her episodes of visual disturbance, stroke and answered questions  Antony Contras, MD Medical Director [facility] Pager: [phone] 08/21/2016 4:41 PM  To contact Stroke Continuity  provider, please refer to [email or web address] After hours, contact General Neurology

## 2016-08-21 NOTE — Progress Notes (Signed)
[facility] PROGRESS NOTE  Christine Middleton [id number] DOB: 01-14-1923 DOA: 08/18/2016 PCP: [name], MD  Assessment/Plan: 83 year old lady with previous history of stroke and hypertension as well as hyperlipidemia who presented with one-day history of left facial droop and dysarthria on 08/18/2006. Last time that she was noted to be in her usual state of health was several hours prior to presentation and was out of TPA window. Code stroke was rescinded. She had a negative CT scan of the head as well as carotid duplex scan. MRI brain showed multiple infarcts  Embolic CVA. 2-D echo 08/18/2016 - EF 60-65%, no regional wall motion abnormality. Grade 2 diastolic dysfunction. No cardiac source of emboli. Carotid Doppler 08/18/2016 - negative. ha1c-5.8. MRI/A of the brain 08/19/2016 was indicative of embolic strokes with new left PCA Occlussion. -Concerns for possible atrial fibrillation as etiology - cardiology consulted for TEE and loop recorder. Patient declined TEE or Loop Recorder -Secondary preventative measures instituted. Target LDL 70 - dose of Statin increased .  Possible Seizure. Depacon initiated per Dr Mamie Nick. Pend EEG   Code Status: ful Family Communication: d/w patient, her family  (indicate person spoken with, relationship, and if by phone, the number) Disposition Plan: home 24-48 hrs. [facility], EEG    Consultants: Neurology  Cardiology   Procedures:  Echo Mri: 1. Right mid M2 branch vessel embolic occlusion with small region of acute infarction in the corona radiata/centrum semiovale. 2. Small acute embolic or watershed infarcts in the high left frontal lobe and lateral right occipital lobe. 3. Restricted diffusion in the left hippocampus. This is also likely an acute infarct given the other coexistent findings, however this appearance can also be a transient finding without chronic sequelae. 4. New proximal left PCA occlusion, also likely embolic  though without an associated acute or chronic large infarct in this  territory.  Antibiotics:  none (indicate start date, and stop date if known)  HPI/Subjective: Alert, no distress, denies focal weakness   Objective: Vitals:   08/21/16 0556 08/21/16 1000  BP: (!) 147/62 (!) 144/67  Pulse: 71 72  Resp: 20 18  Temp: 98.8 F (37.1 C) 98.6 F (37 C)    Intake/Output Summary (Last 24 hours) at 08/21/16 1306 Last data filed at 08/21/16 0600  Gross per 24 hour  Intake              480 ml  Output                0 ml  Net              480 ml   Filed Weights   08/18/16 1000 08/18/16 1059  Weight: 66 kg (145 lb 8.1 oz) 66 kg (145 lb 8.1 oz)    Exam:   General:  Comfortable   Cardiovascular: s1,s2 rrr  Respiratory: CTA BL  Abdomen: soft, nt, nd   Musculoskeletal: no leg edema    Data Reviewed: Basic Metabolic Panel:  Recent Labs Lab 08/18/16 1033 08/18/16 1038  NA 140 140  K 3.7 3.5  CL 105 105  CO2 24  --   GLUCOSE 105* 112*  BUN 19 20  CREATININE 0.99 0.90  CALCIUM 9.0  --    Liver Function Tests:  Recent Labs Lab 08/18/16 1033  AST 22  ALT 12*  ALKPHOS 84  BILITOT 0.8  PROT 6.3*  ALBUMIN 3.6   No results for input(s): LIPASE, AMYLASE in the last 168 hours. No results for input(s): AMMONIA in  the last 168 hours. CBC:  Recent Labs Lab 08/18/16 1033 08/18/16 1038  WBC 7.2  --   NEUTROABS 4.8  --   HGB 12.1 11.6*  HCT 35.7* 34.0*  MCV 90.8  --   PLT 206  --    Cardiac Enzymes: No results for input(s): CKTOTAL, CKMB, CKMBINDEX, TROPONINI in the last 168 hours. BNP (last 3 results) No results for input(s): BNP in the last 8760 hours.  ProBNP (last 3 results) No results for input(s): PROBNP in the last 8760 hours.  CBG: No results for input(s): GLUCAP in the last 168 hours.  No results found for this or any previous visit (from the past 240 hour(s)).   Studies: No results found.  Scheduled Meds: .  stroke: mapping our early  stages of recovery book   Does not apply Once  . aspirin  325 mg Oral Daily  . atenolol  50 mg Oral Daily  . clopidogrel  75 mg Oral Q breakfast  . divalproex  250 mg Oral BID  . enoxaparin (LOVENOX) injection  40 mg Subcutaneous Q24H  . pantoprazole  40 mg Oral Daily  . simvastatin  40 mg Oral q1800   Continuous Infusions:  Principal Problem:   Facial droop Active Problems:   HLD (hyperlipidemia)   Anxiety state   Essential hypertension   GERD   Constipation   Polymyalgia rheumatica (HCC)   Embolic stroke ([place])   Cerebrovascular accident (CVA) ([place])    Time spent: >35 minutes     Kinnie Feil  [facility] Pager [phone]. If 7PM-7AM, please contact night-coverage at [email or web address], password [facility] 08/21/2016, 1:06 PM  LOS: 2 days

## 2016-08-21 NOTE — Discharge Summary (Signed)
Physician Discharge Summary  Christine Middleton [id number] DOB: January 13, 1923 DOA: 08/18/2016  PCP: [name], MD  Admit date: 08/18/2016 Discharge date: 08/22/2016  Time spent: >35 minutes  Recommendations for Outpatient Follow-up:  F/u with neurology in 3-4 weeks PCP in 3-8 days as needed  Discharge Diagnoses:  Principal Problem:   Facial droop Active Problems:   HLD (hyperlipidemia)   Anxiety state   Essential hypertension   GERD   Constipation   Polymyalgia rheumatica (HCC)   Embolic stroke ([place])   Cerebrovascular accident (CVA) ([place])   Discharge Condition: stable   Diet recommendation: low sodium   Filed Weights   08/18/16 1000 08/18/16 1059  Weight: 66 kg (145 lb 8.1 oz) 66 kg (145 lb 8.1 oz)    History of present illness:  83 year old lady with previous history of stroke and hypertension as well as hyperlipidemia who presented with one-day history of left facial droop and dysarthria on 08/18/2006. Last time that she was noted to be in her usual state of health was several hours prior to presentation and was out of TPA window. Code stroke was rescinded. She had a negative CT scan of the head as well as carotid duplex scan. MRI brain showed multiple infarcts  Hospital Course:   Embolic CVA. 2-D echo 08/18/2016 - EF 60-65%, no regional wall motion abnormality. Grade 2 diastolic dysfunction. No cardiac source of emboli. Carotid Doppler 08/18/2016 - negative. ha1c-5.8. MRI/A of the brain 08/19/2016 was indicative of embolic strokes with new left PCA Occlussion. - left facial droop and dysarthria->resolved.  -Concerns for possible atrial fibrillation as etiology - cardiology consulted for TEE and loop recorder. Patient declined TEE/Loop Recorder -Secondary preventative measures instituted. Target LDL 70 - dose of Statin. Neurology recommended to cont plavix. I have d/w patient regarding 30 holter monitoring however she declined.    Possible Seizure. Depacon initiated per Dr  Mamie Nick.   Procedures:  Echo:  (i.e. Studies not automatically included, echos, thoracentesis, etc; not x-rays)  Consultations:  Neurology   Discharge Exam: Vitals:   08/21/16 1000 08/21/16 1448  BP: (!) 144/67 (!) 156/81  Pulse: 72 76  Resp: 18 20  Temp: 98.6 F (37 C) 97.6 F (36.4 C)    General: alert, no distress  Cardiovascular: s1,s2 rrr Respiratory: CTA BL  Discharge Instructions  Discharge Instructions    Diet - low sodium heart healthy    Complete by:  As directed    Increase activity slowly    Complete by:  As directed      Allergies as of 08/22/2016      Reactions   Amoxicillin    REACTION: rash   Codeine    Lisinopril    Nitrofurantoin       Medication List    STOP taking these medications   hydrochlorothiazide 25 MG tablet Commonly known as:  HYDRODIURIL     TAKE these medications   atenolol 100 MG tablet Commonly known as:  TENORMIN TAKE 1 TABLET (100 MG TOTAL) BY MOUTH DAILY.   clopidogrel 75 MG tablet Commonly known as:  PLAVIX TAKE 1 TABLET BY MOUTH DAILY WITH BREAKFAST   divalproex 125 MG DR tablet Commonly known as:  DEPAKOTE Take 3 tablets (375 mg total) by mouth every 12 (twelve) hours.   losartan 100 MG tablet Commonly known as:  COZAAR Take 100 mg by mouth daily. What changed:  Another medication with the same name was removed. Continue taking this medication, and follow the directions you see here.  pantoprazole 40 MG tablet Commonly known as:  PROTONIX TAKE 1 TABLET BY MOUTH EVERY DAY   simvastatin 20 MG tablet Commonly known as:  ZOCOR TAKE 1 TABLET (20 MG TOTAL) BY MOUTH AT BEDTIME.   SUPER B COMPLEX/VITAMIN C PO Take 1 tablet by mouth daily.   traMADol 50 MG tablet Commonly known as:  ULTRAM Take 1 tablet (50 mg total) by mouth every 6 (six) hours as needed for moderate pain.      Allergies  Allergen Reactions  . Amoxicillin     REACTION: rash  . Codeine   . Lisinopril   . Nitrofurantoin        The results of significant diagnostics from this hospitalization (including imaging, microbiology, ancillary and laboratory) are listed below for reference.    Significant Diagnostic Studies: Mr Brain Wo Contrast  Result Date: 08/18/2016 CLINICAL DATA:  Dysarthria and facial droop.  Prior stroke. EXAM: MRI HEAD WITHOUT CONTRAST MRA HEAD WITHOUT CONTRAST TECHNIQUE: Multiplanar, multiecho pulse sequences of the brain and surrounding structures were obtained without intravenous contrast. Angiographic images of the head were obtained using MRA technique without contrast. COMPARISON:  Head CT 08/18/2016.  Head MRI and MRA 10/05/2013. FINDINGS: MRI HEAD FINDINGS Brain: There is a small region of patchy/hazy increased trace diffusion signal with slightly reduced ADC in the right corona radiata and centrum semiovale. A few discrete subcentimeter foci of restricted diffusion are present in the subcortical white matter of the left superior frontal gyrus near the vertex as well as involving the lateral right occipital lobe cortex and may reflect left ACA/MCA and right MCA/ PCA watershed infarcts, respectively. There is also a small region of restricted diffusion in the mesial left temporal lobe at the level of the hippocampal head. A chronic right temporal-occipital infarct is unchanged from the prior MRI. A chronic left cerebellar infarct in the PICA territory is new from 2015. Small foci of T2 hyperintensity scattered throughout the cerebral white matter bilaterally and in the brainstem are similar to the prior MRI and nonspecific but compatible with chronic small vessel ischemia, mild for age. There is mild global cerebral atrophy. No intracranial hemorrhage, midline shift, or extra-axial fluid collection is seen. Chronic small vessel ischemic changes are present in the right greater than left thalami. A small extra-axial mass over the left frontoparietal convexity measures 14 x 7 mm, not significantly  changed from the prior MRI and without significant associated mass effect or evidence of associated edema. Vascular: New small focus of susceptibility artifact associated with an MCA branch vessel in the right sylvian fissure with abnormal FLAIR hyperintensity in more distal MCA branch vessels, more fully evaluated below. Similar findings involving the left PCA. Skull and upper cervical spine: Unremarkable bone marrow signal. Sinuses/Orbits: Prior bilateral cataract extraction. Mild mucosal thickening in the paranasal sinuses. Small volume right sphenoid sinus fluid. Clear mastoid air cells. Other: None. MRA HEAD FINDINGS The visualized distal vertebral arteries are widely patent to the basilar with the right being dominant. Right PICA and left AICA are again not clearly identified. Left PICA and right AICA origins are patent. SCA origins are patent. Basilar artery is widely patent. Posterior communicating arteries are not identified. Right PCA is patent without significant proximal stenosis. There is occlusion of the left PCA at the level of the P1-P2 junction, new from 2015. The internal carotid arteries are widely patent from skullbase to carotid termini. The ACAs and lift MCA are patent without evidence of significant proximal stenosis or major branch occlusion.  The right M1 segment and MCA bifurcation/ trifurcation are widely patent, however the there is occlusion of a mid right M2 middle division branch vessel in the sylvian fissure with decreased flow related enhancement of more distal branches in this distribution. No intracranial aneurysm is identified. IMPRESSION: 1. Right mid M2 branch vessel embolic occlusion with small region of acute infarction in the corona radiata/centrum semiovale. 2. Small acute embolic or watershed infarcts in the high left frontal lobe and lateral right occipital lobe. 3. Restricted diffusion in the left hippocampus. This is also likely an acute infarct given the other coexistent  findings, however this appearance can also be a transient finding without chronic sequelae. 4. New proximal left PCA occlusion, also likely embolic though without an associated acute or chronic large infarct in this territory. 5. Unchanged small left lateral convexity meningioma. 6. 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 Electronically Signed   By: Logan Bores M.D.   On: 08/18/2016 13:14   Mr Jodene Nam Head/brain [id number] Cm  Result Date: 08/18/2016 CLINICAL DATA:  Dysarthria and facial droop.  Prior stroke. EXAM: MRI HEAD WITHOUT CONTRAST MRA HEAD WITHOUT CONTRAST TECHNIQUE: Multiplanar, multiecho pulse sequences of the brain and surrounding structures were obtained without intravenous contrast. Angiographic images of the head were obtained using MRA technique without contrast. COMPARISON:  Head CT 08/18/2016.  Head MRI and MRA 10/05/2013. FINDINGS: MRI HEAD FINDINGS Brain: There is a small region of patchy/hazy increased trace diffusion signal with slightly reduced ADC in the right corona radiata and centrum semiovale. A few discrete subcentimeter foci of restricted diffusion are present in the subcortical white matter of the left superior frontal gyrus near the vertex as well as involving the lateral right occipital lobe cortex and may reflect left ACA/MCA and right MCA/ PCA watershed infarcts, respectively. There is also a small region of restricted diffusion in the mesial left temporal lobe at the level of the hippocampal head. A chronic right temporal-occipital infarct is unchanged from the prior MRI. A chronic left cerebellar infarct in the PICA territory is new from 2015. Small foci of T2 hyperintensity scattered throughout the cerebral white matter bilaterally and in the brainstem are similar to the prior MRI and nonspecific but compatible with chronic small vessel ischemia, mild for age. There is mild global cerebral atrophy. No intracranial hemorrhage, midline shift, or extra-axial fluid  collection is seen. Chronic small vessel ischemic changes are present in the right greater than left thalami. A small extra-axial mass over the left frontoparietal convexity measures 14 x 7 mm, not significantly changed from the prior MRI and without significant associated mass effect or evidence of associated edema. Vascular: New small focus of susceptibility artifact associated with an MCA branch vessel in the right sylvian fissure with abnormal FLAIR hyperintensity in more distal MCA branch vessels, more fully evaluated below. Similar findings involving the left PCA. Skull and upper cervical spine: Unremarkable bone marrow signal. Sinuses/Orbits: Prior bilateral cataract extraction. Mild mucosal thickening in the paranasal sinuses. Small volume right sphenoid sinus fluid. Clear mastoid air cells. Other: None. MRA HEAD FINDINGS The visualized distal vertebral arteries are widely patent to the basilar with the right being dominant. Right PICA and left AICA are again not clearly identified. Left PICA and right AICA origins are patent. SCA origins are patent. Basilar artery is widely patent. Posterior communicating arteries are not identified. Right PCA is patent without significant proximal stenosis. There is occlusion of the left PCA at the level of the P1-P2 junction, new from  2015. The internal carotid arteries are widely patent from skullbase to carotid termini. The ACAs and lift MCA are patent without evidence of significant proximal stenosis or major branch occlusion. The right M1 segment and MCA bifurcation/ trifurcation are widely patent, however the there is occlusion of a mid right M2 middle division branch vessel in the sylvian fissure with decreased flow related enhancement of more distal branches in this distribution. No intracranial aneurysm is identified. IMPRESSION: 1. Right mid M2 branch vessel embolic occlusion with small region of acute infarction in the corona radiata/centrum semiovale. 2. Small  acute embolic or watershed infarcts in the high left frontal lobe and lateral right occipital lobe. 3. Restricted diffusion in the left hippocampus. This is also likely an acute infarct given the other coexistent findings, however this appearance can also be a transient finding without chronic sequelae. 4. New proximal left PCA occlusion, also likely embolic though without an associated acute or chronic large infarct in this territory. 5. Unchanged small left lateral convexity meningioma. 6. 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 Electronically Signed   By: Logan Bores M.D.   On: 08/18/2016 13:14   Ct Head Code Stroke W/o Cm  Result Date: 08/18/2016 CLINICAL DATA:  Code stroke. Code stroke. Left upper extremity weakness. Left-sided facial droop. EXAM: CT HEAD WITHOUT CONTRAST TECHNIQUE: Contiguous axial images were obtained from the base of the skull through the vertex without intravenous contrast. COMPARISON:  MRI brain 10/05/2013. CT head without contrast 10/04/2013. FINDINGS: Brain: An inferior left cerebellar infarct is new since the prior study, but not acute. A remote posterior right temporal and occipital lobe infarct is stable. Right basal ganglia are intact. No acute cortical infarct is present. The insular ribbon is within normal limits. The brainstem and cerebellum are otherwise unremarkable. A left frontal meningioma is stable. Atrophy and white matter disease is stable. Vascular: Atherosclerotic calcifications are present within the cavernous internal carotid arteries bilaterally. There is no hyperdense vessel. Skull: The calvarium is intact. No significant extracranial soft tissue lesion is present. Sinuses/Orbits: Circumferential mucosal thickening is present in the maxillary sinuses bilaterally. There is mucosal thickening within the anterior right ethmoid air cells and inferior right frontal sinus. A fluid level is present within the right sphenoidal recess. There is  mild mucosal thickening posteriorly in the right sphenoid sinus. The left sphenoid sinus is clear. The mastoid air cells are clear. ASPECTS [facility] Stroke Program Early CT Score) - Ganglionic level infarction (caudate, lentiform nuclei, internal capsule, insula, M1-M3 cortex): 7/7 - Supraganglionic infarction (M4-M6 cortex): 3/3 Total score (0-10 with 10 being normal): 10/10 IMPRESSION: 1. No acute intracranial abnormality. 2. Interval infarct within the inferior left cerebellum does not appear acute. 3. Stable remote posterior right temporal and occipital lobe infarct. 4. Stable atrophy and white matter disease. 5. ASPECTS is 10/10 These results were called by telephone at the time of interpretation on 08/18/2016 at 10:48 am to Dr. Lawana Pai, who verbally acknowledged these results. Electronically Signed   By: San Morelle M.D.   On: 08/18/2016 10:49    Microbiology: No results found for this or any previous visit (from the past 240 hour(s)).   Labs: Basic Metabolic Panel:  Recent Labs Lab 08/18/16 1033 08/18/16 1038  NA 140 140  K 3.7 3.5  CL 105 105  CO2 24  --   GLUCOSE 105* 112*  BUN 19 20  CREATININE 0.99 0.90  CALCIUM 9.0  --    Liver Function Tests:  Recent Labs Lab 08/18/16  1033  AST 22  ALT 12*  ALKPHOS 84  BILITOT 0.8  PROT 6.3*  ALBUMIN 3.6   No results for input(s): LIPASE, AMYLASE in the last 168 hours. No results for input(s): AMMONIA in the last 168 hours. CBC:  Recent Labs Lab 08/18/16 1033 08/18/16 1038  WBC 7.2  --   NEUTROABS 4.8  --   HGB 12.1 11.6*  HCT 35.7* 34.0*  MCV 90.8  --   PLT 206  --    Cardiac Enzymes: No results for input(s): CKTOTAL, CKMB, CKMBINDEX, TROPONINI in the last 168 hours. BNP: BNP (last 3 results) No results for input(s): BNP in the last 8760 hours.  ProBNP (last 3 results) No results for input(s): PROBNP in the last 8760 hours.  CBG: No results for input(s): GLUCAP in the last 168  hours.     SignedKinnie Feil  [facility] 08/21/2016, 4:00 PM

## 2016-08-21 NOTE — Progress Notes (Addendum)
Occupational Therapy Treatment Patient Details Name: Christine Middleton MRN: [id number] DOB: [birth year] Today's Date: 08/21/2016    History of present illness 83 yo female admitted with L facial droop and abnormal speech. MRI (+) R M2 branch vessel embolic occlusion with small region of acute infarct in the corona radiata / centrum semiovale. Small acute embolic or watershed infarcts in the high left frontal lobe and lateral R occipital lobe.    OT comments  Pt progressing well toward OT goals. Able to stand at sink with supervision for grooming tasks. Pt reporting visual changes that "come and go." On OT arrival, pt reports that her peripheral visual deficits have returned but are greater to her R this am. Pt required verbal cues to construct clock due to difficulty problem solving through task but did attend to both sides of visual field equally. Pt with difficulty reading and only able to see L half of words; however, able to scan scattered scanning sheet accurately with organized scanning pattern. Also noted ability to identify items on sink and breakfast tray. Educated pt on compensatory strategies to implement to improve safety with ADL. Due to significantly decreased functional use of vision, updated D/C recommendations to home health OT and recommend that pt refrain from driving until cleared by MD. Also recommend that pt have full visual field test. OT will continue to follow acutely.   Follow Up Recommendations  Home Health OT    Equipment Recommendations  None recommended by OT    Recommendations for Other Services      Precautions / Restrictions Precautions Precautions: Fall Precaution Comments: fall 2 years ago outside working in the yard Restrictions Weight Bearing Restrictions: No       Mobility Bed Mobility Overal bed mobility: Modified Independent                Transfers Overall transfer level: Needs assistance Equipment used: None Transfers: Sit to/from  Stand Sit to Stand: Supervision              Balance Overall balance assessment: Needs assistance Sitting-balance support: Feet supported;No upper extremity supported Sitting balance-Leahy Scale: Good     Standing balance support: No upper extremity supported;During functional activity Standing balance-Leahy Scale: Good Standing balance comment: Does reach for furniture at times and reports that she feels unsteady on her feet this am.                   ADL Overall ADL's : Needs assistance/impaired     Grooming: Supervision/safety;Standing;Oral care;Wash/dry face;Wash/dry Teacher, music: Supervision/safety;Ambulation Toilet Transfer Details (indicate cue type and reason): Supervision as pt slightly unsteady and reaching for furniture throughout task. Toileting- Water quality scientist and Hygiene: Supervision/safety                Vision Eye Alignment: Within Functional Limits Alignment/Gaze Preference: Within Defined Limits Ocular Range of Motion: Within Functional Limits Tracking/Visual Pursuits: Decreased smoothness of horizontal tracking;Right eye does not track laterally;Left eye does not track laterally (Able on second attempt, but unable in first ) Saccades: Additional eye shifts occurred during testing;Decreased speed of saccadic movement Convergence: Within functional limits     Additional Comments: Additional eye movements noted with tracking especially to the R.   Perception     Praxis      Cognition   Behavior During Therapy: [facility] for tasks assessed/performed Overall Cognitive Status: Impaired/Different from baseline  Area of Impairment: Memory;Problem solving     Memory: Decreased short-term memory        Problem Solving: Slow processing;Requires verbal cues;Decreased initiation General Comments: Slightly decreased short-term memory with pt forgetting recently discussed information concerning upcoming EEG.  Pt with difficulty constructing clock requiring VC's to place numbers and demonstrating decreased ability to problem solve through the task. Able to visually construct rim of clock but confused with placing numbers and hands in correct places.    Extremity/Trunk Assessment               Exercises     Shoulder Instructions       General Comments      Pertinent Vitals/ Pain       Pain Assessment: No/denies pain  Home Living Family/patient expects to be discharged to:: Private residence Living Arrangements: Spouse/significant other;Children                                      Prior Functioning/Environment              Frequency  Min 2X/week        Progress Toward Goals  OT Goals(current goals can now be found in the care plan section)  Progress towards OT goals: Progressing toward goals  Acute Rehab OT Goals Patient Stated Goal: to go home and take care of spouse OT Goal Formulation: With patient Time For Goal Achievement: 09/02/16 Potential to Achieve Goals: Good ADL Goals Additional ADL Goal #1: Pt will complete visual assessment with less than 2 errors Additional ADL Goal #2: Pt will complete 3 adls at sink level mod I   Plan Discharge plan needs to be updated.    Co-evaluation                 End of Session Equipment Utilized During Treatment: Gait belt   Activity Tolerance Patient tolerated treatment well   Patient Left in bed;with call bell/phone within reach;with family/visitor present   Nurse Communication Mobility status        Time: [id number] OT Time Calculation (min): 44 min  Charges: OT General Charges $OT Visit: 1 Procedure OT Treatments $Self Care/Home Management : 8-22 mins $Therapeutic Activity: 23-37 mins  Norman Herrlich, OTR/L [phone] 08/21/2016, 11:03 AM

## 2016-08-21 NOTE — Progress Notes (Signed)
EEG Completed; Results Pending  

## 2016-08-22 DIAGNOSIS — E785 Hyperlipidemia, unspecified: Secondary | ICD-10-CM

## 2016-08-22 DIAGNOSIS — I10 Essential (primary) hypertension: Secondary | ICD-10-CM

## 2016-08-22 MED ORDER — DIVALPROEX SODIUM 125 MG PO DR TAB: 375.0000 mg | DELAYED_RELEASE_TABLET | Freq: Two times a day (BID) | ORAL | 0 refills | Status: DC

## 2016-08-22 NOTE — Care Management Note (Signed)
Case Management Note  Patient Details  Name: Christine Middleton MRN: [id number] Date of Birth: [birth year]  Subjective/Objective:                    Action/Plan: Pt discharging home with [facility] services. CM met with the patient and provided her and her son a list of [facility] agencies. She states she has used [facility] for her husband and would like to use them for herself. Mary with [facility] notified and accepted the referral. Pt's son to provide transportation home.   Expected Discharge Date:  08/22/16               Expected Discharge Plan:  [place]  In-House Referral:     Discharge planning Services  CM Consult  Post Acute Care Choice:  Home Health Choice offered to:  Patient  DME Arranged:  N/A DME Agency:  NA  HH Arranged:  PT, RN, Nurse's Aide [place] Agency:  [facility] (now [facility])  Status of Service:  Completed, signed off  If discussed at [employer] of Stay Meetings, dates discussed:    Additional Comments:  Pollie Friar, RN 08/22/2016, 2:43 PM

## 2016-08-22 NOTE — Progress Notes (Signed)
Physical Therapy Treatment Patient Details Name: Christine Middleton MRN: [id number] DOB: [birth year] Today's Date: 08/22/2016    History of Present Illness 83 yo female admitted with L facial droop and abnormal speech. MRI (+) R M2 branch vessel embolic occlusion with small region of acute infarct in the corona radiata / centrum semiovale. Small acute embolic or watershed infarcts in the high left frontal lobe and lateral R occipital lobe.     PT Comments    Pt progressing well towards physical therapy goals. Was able to perform transfers and ambulation with minimal unsteadiness and no assist required. Pt anticipates d/c home today, and the pt and family report no questions/concerns with managing at home. Will continue to follow.  Follow Up Recommendations  Home health PT;Supervision - Intermittent     Equipment Recommendations  None recommended by PT    Recommendations for Other Services       Precautions / Restrictions Precautions Precautions: Fall Precaution Comments: fall 2 years ago outside working in the yard Restrictions Weight Bearing Restrictions: No    Mobility  Bed Mobility               General bed mobility comments: Pt sitting up in chair upon PT arrival.   Transfers Overall transfer level: Modified independent Equipment used: None Transfers: Sit to/from Stand Sit to Stand: Modified independent (Device/Increase time)         General transfer comment: pushes up with min use of B UE's, no LOB  Ambulation/Gait Ambulation/Gait assistance: Supervision Ambulation Distance (Feet): 400 Feet Assistive device: None Gait Pattern/deviations: Step-through pattern;Decreased stride length Gait velocity: Decreased Gait velocity interpretation: Below normal speed for age/gender General Gait Details: Pt ambulated well with no LOB noted. Occasionally unsteadiness was noted but no assist was required to recover.    Stairs            Wheelchair Mobility     Modified Rankin (Stroke Patients Only) Modified Rankin (Stroke Patients Only) Pre-Morbid Rankin Score: No symptoms Modified Rankin: No significant disability     Balance Overall balance assessment: Needs assistance Sitting-balance support: Feet supported;No upper extremity supported Sitting balance-Leahy Scale: Good     Standing balance support: No upper extremity supported;Single extremity supported;During functional activity Standing balance-Leahy Scale: Good Standing balance comment: pt able to stand without use of UE's but used single UE support during standing balance exercises.                     Cognition Arousal/Alertness: Awake/alert Behavior During Therapy: WFL for tasks assessed/performed Overall Cognitive Status: Impaired/Different from baseline Area of Impairment: Problem solving;Memory     Memory: Decreased short-term memory       Problem Solving: Slow processing      Exercises      General Comments        Pertinent Vitals/Pain Pain Assessment: No/denies pain    Home Living Family/patient expects to be discharged to:: Private residence Living Arrangements: Spouse/significant other;Children                  Prior Function            PT Goals (current goals can now be found in the care plan section) Acute Rehab PT Goals Patient Stated Goal: to go home and take care of spouse PT Goal Formulation: With patient Time For Goal Achievement: 09/02/16 Potential to Achieve Goals: Good Progress towards PT goals: Progressing toward goals    Frequency    Min 4X/week  PT Plan Current plan remains appropriate    Co-evaluation             End of Session Equipment Utilized During Treatment: Gait belt Activity Tolerance: Patient tolerated treatment well Patient left: in chair;with call bell/phone within reach;with family/visitor present     Time: [id number] PT Time Calculation (min) (ACUTE ONLY): 13 min  Charges:   $Gait Training: 8-22 mins                    G Codes:      Thelma Comp Sep 06, 2016, 2:21 PM   Rolinda Roan, PT, DPT Acute Rehabilitation Services Pager: [phone]

## 2016-08-22 NOTE — Progress Notes (Signed)
Patient is discharged from room 5M19 at this time. Alert and in stable condition. IV site d/c'd as well as tele. Instructions read to patient and family with understanding verbalized. Left unit via wheelchair with all belongings at side.

## 2016-08-22 NOTE — Progress Notes (Signed)
Occupational Therapy Treatment Patient Details Name: Christine Middleton MRN: [id number] DOB: 1923/01/12 Today's Date: 08/22/2016    History of present illness 83 yo female admitted with L facial droop and abnormal speech. MRI (+) R M2 branch vessel embolic occlusion with small region of acute infarct in the corona radiata / centrum semiovale. Small acute embolic or watershed infarcts in the high left frontal lobe and lateral R occipital lobe.    OT comments  Pt progressing well toward OT goals. She continues to demonstrate difficulty reading and R peripheral field deficits at times although she reports this "comes and goes." Pt requires greatly increased time to read sentence from newspaper article at times missing words to the right side of the column and requiring increased head movement to make out words. Noted the same when reading phone number posted on wall. Educated pt on having assistance with medication management post-acute D/C due to difficulty with reading tasks and she verbalizes agreement and understanding. Additionally, recommended that pt refrain from driving until she has full visual field assessment. Pt and family report understanding. She continues to require increased processing time and demonstrate decreased short-term memory during problem solving tasks to identify room. Continue to recommend home health OT services and intermittent supervision from family. OT will continue to follow.   Follow Up Recommendations  Home health OT;Supervision - Intermittent    Equipment Recommendations  None recommended by OT    Recommendations for Other Services      Precautions / Restrictions Precautions Precautions: Fall Precaution Comments: fall 2 years ago outside working in the yard Restrictions Weight Bearing Restrictions: No       Mobility Bed Mobility               General bed mobility comments: OOB in chair on arrival  Transfers Overall transfer level: Modified  independent Equipment used: None Transfers: Sit to/from Stand Sit to Stand: Modified independent (Device/Increase time)         General transfer comment: pushes up with min use of B UE's, no LOB    Balance Overall balance assessment: Needs assistance Sitting-balance support: Feet supported;No upper extremity supported Sitting balance-Leahy Scale: Good     Standing balance support: No upper extremity supported;Single extremity supported;During functional activity Standing balance-Leahy Scale: Good Standing balance comment: pt able to stand without use of UE's but used single UE support during standing balance exercises.                    ADL Overall ADL's : Needs assistance/impaired                         Toilet Transfer: Supervision/safety;Ambulation           Functional mobility during ADLs: Supervision/safety General ADL Comments: Pt with difficulty with basic ADL and IADL due to visual deficits. Decreased peripheral vision to her R and difficulty reading requiring multiple head/eye position changes to make out words. Able to see L half of words but difficulty with R half. Significantly increased time to read sentence in newspaper. Does improve with finger follow and isolation of single line of text.       Vision                 Additional Comments: See comments in ADL section. Reports visual deficits continue to "come and go."    Perception     Praxis      Cognition   Behavior During  Therapy: WFL for tasks assessed/performed Overall Cognitive Status: Impaired/Different from baseline Area of Impairment: Problem solving     Memory: Decreased short-term memory        Problem Solving: Slow processing;Difficulty sequencing General Comments: Pt demonstrates need for increased processing time and verbal cues for problem solving tasks in hallway/distracting environment. Difficutly remembering location of room and room number. VC's required  for sequencing activities.    Extremity/Trunk Assessment               Exercises     Shoulder Instructions       General Comments      Pertinent Vitals/ Pain       Pain Assessment: No/denies pain  Home Living Family/patient expects to be discharged to:: Private residence Living Arrangements: Spouse/significant other;Children                                      Prior Functioning/Environment              Frequency  Min 2X/week        Progress Toward Goals  OT Goals(current goals can now be found in the care plan section)  Progress towards OT goals: Progressing toward goals  Acute Rehab OT Goals Patient Stated Goal: to go home and take care of spouse OT Goal Formulation: With patient Time For Goal Achievement: 09/02/16 Potential to Achieve Goals: Good ADL Goals Additional ADL Goal #1: Pt will complete visual assessment with less than 2 errors Additional ADL Goal #2: Pt will complete 3 adls at sink level mod I   Plan Discharge plan remains appropriate    Co-evaluation                 End of Session     Activity Tolerance Patient tolerated treatment well   Patient Left in chair;with call bell/phone within reach;with family/visitor present   Nurse Communication Mobility status        Time: 1505-1530 OT Time Calculation (min): 25 min  Charges: OT General Charges $OT Visit: 1 Procedure OT Treatments $Self Care/Home Management : 8-22 mins $Therapeutic Activity: 8-22 mins  Norman Herrlich, OTR/L [phone] 08/22/2016, 3:53 PM

## 2016-08-24 ENCOUNTER — Telehealth: Payer: Self-pay

## 2016-08-24 NOTE — Telephone Encounter (Signed)
D/C 08/22/16 To: home  Spoke with pt's son. He states that pt is doing well. She is still unsteady and having some vision problems, but overall is doing well.   Appt not scheduled as he states pt's daughter handles her appts. He will have her call to schedule.    Transition Care Management Follow-up Telephone Call  How have you been since you were released from the hospital? Good, improving   Do you understand why you were in the hospital? yes   Do you understand the discharge instrcutions? yes  Items Reviewed:  Medications reviewed: yes  Allergies reviewed: yes  Dietary changes reviewed: yes  Referrals reviewed: yes   Functional Questionnaire:   Activities of Daily Living (ADLs):   She states they are independent in the following: feeding and toileting States they require assistance with the following: ambulation, bathing and hygiene, grooming and dressing   Any transportation issues/concerns?: no   Any patient concerns? no   Confirmed importance and date/time of follow-up visits scheduled: yes   Confirmed with patient if condition begins to worsen call PCP or go to the ER.  Patient was given the Call-a-Nurse line [phone]: yes

## 2016-08-25 ENCOUNTER — Telehealth: Payer: Self-pay | Admitting: Family Medicine

## 2016-08-25 DIAGNOSIS — Z7902 Long term (current) use of antithrombotics/antiplatelets: Secondary | ICD-10-CM | POA: Diagnosis not present

## 2016-08-25 DIAGNOSIS — Z8673 Personal history of transient ischemic attack (TIA), and cerebral infarction without residual deficits: Secondary | ICD-10-CM | POA: Diagnosis not present

## 2016-08-25 DIAGNOSIS — F419 Anxiety disorder, unspecified: Secondary | ICD-10-CM | POA: Diagnosis not present

## 2016-08-25 DIAGNOSIS — I10 Essential (primary) hypertension: Secondary | ICD-10-CM | POA: Diagnosis not present

## 2016-08-25 DIAGNOSIS — R531 Weakness: Secondary | ICD-10-CM | POA: Diagnosis not present

## 2016-08-25 DIAGNOSIS — M353 Polymyalgia rheumatica: Secondary | ICD-10-CM | POA: Diagnosis not present

## 2016-08-25 NOTE — Telephone Encounter (Signed)
Per Dr. Sarajane Jews okay and I did speak with Pam, gave verbal orders for below request.

## 2016-08-25 NOTE — Telephone Encounter (Signed)
Pt had a stroke and pam will follow her with bp perimeters, respiratory, temp and pulse also  fall risk and medication management. Pam will follow pt once a wk for 1 wk and then twice a wk for 4 wks. Verbal order is ok

## 2016-08-25 NOTE — Telephone Encounter (Signed)
° ° °  Darlina Guys with [facility] call to say that a nurse will be out to see the pt today

## 2016-08-29 ENCOUNTER — Other Ambulatory Visit: Payer: Self-pay | Admitting: Family Medicine

## 2016-08-29 ENCOUNTER — Ambulatory Visit (INDEPENDENT_AMBULATORY_CARE_PROVIDER_SITE_OTHER): Payer: Medicare Other | Admitting: Family Medicine

## 2016-08-29 ENCOUNTER — Encounter: Payer: Self-pay | Admitting: Family Medicine

## 2016-08-29 VITALS — BP 186/96 | HR 69 | Temp 97.3°F | Ht 63.0 in | Wt 134.0 lb

## 2016-08-29 DIAGNOSIS — E782 Mixed hyperlipidemia: Secondary | ICD-10-CM

## 2016-08-29 DIAGNOSIS — I10 Essential (primary) hypertension: Secondary | ICD-10-CM | POA: Diagnosis not present

## 2016-08-29 DIAGNOSIS — I639 Cerebral infarction, unspecified: Secondary | ICD-10-CM | POA: Diagnosis not present

## 2016-08-29 MED ORDER — SIMVASTATIN 20 MG PO TABS: ORAL_TABLET | ORAL | 3 refills | Status: DC

## 2016-08-29 MED ORDER — DIVALPROEX SODIUM 125 MG PO DR TAB: 375.0000 mg | DELAYED_RELEASE_TABLET | Freq: Two times a day (BID) | ORAL | 1 refills | Status: DC

## 2016-08-29 MED ORDER — AMLODIPINE BESYLATE 5 MG PO TABS: 5.0000 mg | ORAL_TABLET | Freq: Every day | ORAL | 3 refills | Status: DC

## 2016-08-29 NOTE — Progress Notes (Signed)
Pre visit review using our clinic review tool, if applicable. No additional management support is needed unless otherwise documented below in the visit note. 

## 2016-08-29 NOTE — Progress Notes (Signed)
   Subjective:    Patient ID: Christine Middleton, female    DOB: [birth year], 83 y.o.   MRN: [id number]  HPI Here with her son Christine Middleton to follow up a hospital stay from 08-18-16 to 08-22-16 for a stroke. She presented with left facial drooping and dysarthria. A CT scan of the head was clear, but a brain MRI revealed several older infarcts and several recent infarcts. The MRA revealed no significant vessel stenoses, and dopplers of her carotids and vertebrals showed only 1-39% occlusions to both internal carotids. An ECHO was unremarkable with no wall motion abnormalities and no thrombi. Since no source for emboli was found, there was thought that she could be having intermittent atrial fibrillation. It was suggested that she have a TEE and have a loop recorder placed, but she declined. She remained in sinus rhythm through the entire stay. Her lipids were under excellent control with her LDL coming back at 76. Her BP has been running a little high. Her HCTZ was stopped at the hospital. Her BP has not  Been checked since she went home. She denies any headache or chest pain or SOB. Today her main complaint is intermittent loss of vision in the right eye. She is on Plavix but she was told to stop taking aspirin.    Review of Systems  Constitutional: Negative.   Eyes: Positive for visual disturbance.  Respiratory: Negative.   Cardiovascular: Negative.   Gastrointestinal: Negative.   Genitourinary: Negative.   Neurological: Negative.        Objective:   Physical Exam  Constitutional: She is oriented to person, place, and time. She appears well-developed and well-nourished. No distress.  Neck: No thyromegaly present.  Cardiovascular: Normal rate, regular rhythm, normal heart sounds and intact distal pulses.   No murmur heard. Pulmonary/Chest: Effort normal and breath sounds normal. No respiratory distress. She has no wheezes. She has no rales.  Abdominal: Soft. Bowel sounds are normal. She exhibits no  distension and no mass. There is no tenderness. There is no rebound and no guarding.  Musculoskeletal: She exhibits no edema.  Lymphadenopathy:    She has no cervical adenopathy.  Neurological: She is alert and oriented to person, place, and time. She has normal reflexes. No cranial nerve deficit. She exhibits normal muscle tone. Coordination normal.          Assessment & Plan:  Here following up from a recent stroke. Her speech and facial droop are steadily improving, but she still has trouble with vision loss. She will see her eye doctor sometime soon. She will remain on Plavix. She is on Depakote for seizure prevention, at least for now. She is scheduled to follow up with Dr. Antony Contras in Neurology on 10-11-16. Her BP remains elevated, so we will add Amlodipine 5 mg daily to her regimen. I asked them to check her BP 3 times a week and report back to [place]. She will follow up with [place] in 3 weeks. I told her that I agreed with her decision to not undergo a TEE or a loop recorder because she is already on an anticoagulant and I do not see where the information gained from these studies would change our management very much.  [name], MD

## 2016-08-29 NOTE — Telephone Encounter (Signed)
Please okay these nursing visits

## 2016-08-29 NOTE — Telephone Encounter (Signed)
Pam, another nurse with [facility], is calling today to request skilled nursing for 1 wk for 1 visit (08/25/16), then 2 visits per week x 4wks with 2 PRN visits associated with the stroke.  Stated that the pt is having some vision problems as well.  The best contact number for Jeannene Patella is [phone] for the office of [facility].  **ALSO, she would like an updated medication list faxed when the patient leaves our office today.  [phone] is the fax number.**

## 2016-08-30 NOTE — Telephone Encounter (Signed)
I spoke with Pam and gave verbal orders, also printed the medication list and faxed to below number.

## 2016-08-30 NOTE — Telephone Encounter (Signed)
Pt seen 08/19/16 by Dr Sarajane Jews.

## 2016-09-01 ENCOUNTER — Telehealth: Payer: Self-pay | Admitting: Family Medicine

## 2016-09-01 NOTE — Telephone Encounter (Signed)
° °  Flor a PT with [facility] asking for be verbal orders for PT   1 week 1 2 week 2 1 week 1  [phone]

## 2016-09-04 ENCOUNTER — Encounter: Payer: Self-pay | Admitting: *Deleted

## 2016-09-04 ENCOUNTER — Other Ambulatory Visit: Payer: Self-pay | Admitting: *Deleted

## 2016-09-04 NOTE — Patient Outreach (Signed)
[facility]) Care Management  09/04/2016  Christine Middleton [birth year] [id number]  Referral via EMMI-Stroke-red on dashboard for lost of interest in things they use to enjoy.. Inpatient hospital admission 1/19-1/23/2018 with stroke diagnosis.  Telephone call to patient; son-Edward answered call & advised that he had been answering all of calls including electronic calls for his Mother (patient). HIPPA verification received from son/caregiver.   Son/caregiver states patient recently had stroke with facial drooping, trouble talking, & trouble seeing out of one eye. States all of symptoms are resolved or improved accept vision. States she is concerned about her vision because she can't do some of the things that she was doing prior to having most recent stroke that required use of her vision. States she is a little down about that but she has a scheduled appointment with eye specialist 2/7th.   Advised caregiver to talk to patient's primary care provider if patient shows progressive decline in emotional status. States he would do so. States patient had hospital follow up with primary care 01/30 and has next appointment 2/19.  Also has follow up appointment with stroke MD scheduled for 10/11/2016.  Son/caregiver states he makes sure patient takes medications as instructed by MD. States his sister fixes medication box and takes her to MD appointments. States she is currently using walker and is getting home health services of RN and physical therapist.  Son/caregiver states he is aware of stroke symptoms & knows to call 911 if symptoms occur.   EMMI call completed.   Plan: Send EMMI-educational stroke information. Close case.  Sherrin Daisy, RN BSN CCM Care Management Coordinator [facility] Care Management  [phone]   .

## 2016-09-04 NOTE — Telephone Encounter (Signed)
Per Dr. Sarajane Jews okay and I did speak with Orland Mustard, gave verbal order for PT.

## 2016-09-06 DIAGNOSIS — D3131 Benign neoplasm of right choroid: Secondary | ICD-10-CM | POA: Diagnosis not present

## 2016-09-06 DIAGNOSIS — H353131 Nonexudative age-related macular degeneration, bilateral, early dry stage: Secondary | ICD-10-CM | POA: Diagnosis not present

## 2016-09-06 DIAGNOSIS — H53461 Homonymous bilateral field defects, right side: Secondary | ICD-10-CM | POA: Diagnosis not present

## 2016-09-06 DIAGNOSIS — I639 Cerebral infarction, unspecified: Secondary | ICD-10-CM | POA: Diagnosis not present

## 2016-09-06 NOTE — Addendum Note (Signed)
Addended by: Virgel Manifold on: 09/06/2016 09:06 AM   Modules accepted: Level of Service, SmartSet

## 2016-09-06 NOTE — Progress Notes (Signed)
This encounter was created in error - please disregard.

## 2016-09-07 NOTE — Telephone Encounter (Signed)
This encounter was created in error - please disregard.

## 2016-09-15 ENCOUNTER — Telehealth: Payer: Self-pay | Admitting: Family Medicine

## 2016-09-15 NOTE — Telephone Encounter (Signed)
She was started on this after her recent stroke to prevent seizures. She sees Dr. Leonie Man, the neurologist, on 10-11-16 and my guess is he will stop the med at that time

## 2016-09-15 NOTE — Telephone Encounter (Signed)
Daughter called to ask if you will call her concerning pt's meds.  Christine Middleton would like to know why pt is on divalproex (DEPAKOTE) 125 MG DR tablet.  Pt may be having some side effects due to this med.  Daughter just not sure.  Pt has CPE on Monday. But would like a call back today.  Husband, Briannia Coers passed away last night, but they still plan to keep the Monday appt for her.

## 2016-09-15 NOTE — Telephone Encounter (Signed)
I spoke with Christine Middleton and went over below information, pt has appointment here on Monday 09/18/2016 will discuss further during that visit.

## 2016-09-18 ENCOUNTER — Ambulatory Visit (INDEPENDENT_AMBULATORY_CARE_PROVIDER_SITE_OTHER): Payer: Medicare Other | Admitting: Family Medicine

## 2016-09-18 ENCOUNTER — Encounter: Payer: Self-pay | Admitting: Family Medicine

## 2016-09-18 VITALS — BP 135/98 | HR 77 | Wt 133.0 lb

## 2016-09-18 DIAGNOSIS — N259 Disorder resulting from impaired renal tubular function, unspecified: Secondary | ICD-10-CM

## 2016-09-18 DIAGNOSIS — M26629 Arthralgia of temporomandibular joint, unspecified side: Secondary | ICD-10-CM | POA: Insufficient documentation

## 2016-09-18 DIAGNOSIS — I6319 Cerebral infarction due to embolism of other precerebral artery: Secondary | ICD-10-CM | POA: Diagnosis not present

## 2016-09-18 DIAGNOSIS — K5909 Other constipation: Secondary | ICD-10-CM

## 2016-09-18 DIAGNOSIS — S0300XA Dislocation of jaw, unspecified side, initial encounter: Secondary | ICD-10-CM | POA: Insufficient documentation

## 2016-09-18 DIAGNOSIS — I10 Essential (primary) hypertension: Secondary | ICD-10-CM

## 2016-09-18 DIAGNOSIS — F411 Generalized anxiety disorder: Secondary | ICD-10-CM | POA: Diagnosis not present

## 2016-09-18 DIAGNOSIS — M26621 Arthralgia of right temporomandibular joint: Secondary | ICD-10-CM

## 2016-09-18 LAB — BASIC METABOLIC PANEL
BUN: 19 mg/dL (ref 6–23)
CO2: 31 mEq/L (ref 19–32)
Calcium: 8.9 mg/dL (ref 8.4–10.5)
Chloride: 104 mEq/L (ref 96–112)
Creatinine, Ser: 0.81 mg/dL (ref 0.40–1.20)
GFR: 70.27 mL/min (ref >60.00)
Glucose, Bld: 85 mg/dL (ref 70–99)
Potassium: 3.9 mEq/L (ref 3.5–5.1)
Sodium: 143 mEq/L (ref 135–145)

## 2016-09-18 LAB — CBC WITH DIFFERENTIAL/PLATELET
Basophils Absolute: 0.1 10*3/uL (ref 0.0–0.1)
Basophils Relative: 0.7 % (ref 0.0–3.0)
Eosinophils Absolute: 0.3 10*3/uL (ref 0.0–0.7)
Eosinophils Relative: 3.5 % (ref 0.0–5.0)
HCT: 38.3 % (ref 36.0–46.0)
Hemoglobin: 12.8 g/dL (ref 12.0–15.0)
Lymphocytes Relative: 20.7 % (ref 12.0–46.0)
Lymphs Abs: 1.6 10*3/uL (ref 0.7–4.0)
MCHC: 33.5 g/dL (ref 30.0–36.0)
MCV: 92.3 fl (ref 78.0–100.0)
Monocytes Absolute: 0.9 10*3/uL (ref 0.1–1.0)
Monocytes Relative: 11.3 % (ref 3.0–12.0)
Neutro Abs: 4.9 10*3/uL (ref 1.4–7.7)
Neutrophils Relative %: 63.8 % (ref 43.0–77.0)
Platelets: 181 10*3/uL (ref 150.0–400.0)
RBC: 4.15 Mil/uL (ref 3.87–5.11)
RDW: 13.3 % (ref 11.5–15.5)
WBC: 7.7 10*3/uL (ref 4.0–10.5)

## 2016-09-18 LAB — POC URINALSYSI DIPSTICK (AUTOMATED)
Bilirubin, UA: NEGATIVE
Blood, UA: NEGATIVE
Glucose, UA: NEGATIVE
Ketones, UA: NEGATIVE
Nitrite, UA: NEGATIVE
Protein, UA: NEGATIVE
Spec Grav, UA: 1.02
Urobilinogen, UA: 0.2
pH, UA: 5.5

## 2016-09-18 LAB — LIPID PANEL
Cholesterol: 163 mg/dL (ref 0–200)
HDL: 77.6 mg/dL (ref >39.00)
LDL Cholesterol: 72 mg/dL (ref 0–99)
NonHDL: 85.28
Total CHOL/HDL Ratio: 2
Triglycerides: 67 mg/dL (ref 0.0–149.0)
VLDL: 13.4 mg/dL (ref 0.0–40.0)

## 2016-09-18 LAB — TSH: TSH: 1.34 u[IU]/mL (ref 0.35–4.50)

## 2016-09-18 LAB — HEPATIC FUNCTION PANEL
ALT: 11 U/L (ref 0–35)
AST: 13 U/L (ref 0–37)
Albumin: 3.9 g/dL (ref 3.5–5.2)
Alkaline Phosphatase: 53 U/L (ref 39–117)
Bilirubin, Direct: 0.2 mg/dL (ref 0.0–0.3)
Total Bilirubin: 0.6 mg/dL (ref 0.2–1.2)
Total Protein: 6.4 g/dL (ref 6.0–8.3)

## 2016-09-18 NOTE — Progress Notes (Signed)
Subjective:    Patient ID: Christine Middleton, female    DOB: 04/07/1923, 83 y.o.   MRN: [id number]  HPI 83 yr old female with her daughter to follow up on issues. She is recovering form the stroke she had last month. She wallks with a walker. Her BP is stable at home. She complains of constipation. She uses Miralax every other day. She also complains of seeing things that aren't there. She is aware these things are not real but this causes her some anxiety. Of note she has been taking Depakote since her stroke to prevent seizures. She does not take Tramadol any longer. Finally she mentions a popping or grinding in the right jaw. There is no pain and her jaw never locks.   Review of Systems  HENT: Negative.   Eyes: Negative.   Respiratory: Negative.   Cardiovascular: Negative.   Gastrointestinal: Positive for constipation. Negative for abdominal distention, abdominal pain, anal bleeding, blood in stool, diarrhea, nausea, rectal pain and vomiting.  Genitourinary: Negative for decreased urine volume, difficulty urinating, dyspareunia, dysuria, enuresis, flank pain, frequency, hematuria, pelvic pain and urgency.  Musculoskeletal: Negative.   Skin: Negative.   Neurological: Positive for speech difficulty and weakness. Negative for dizziness, tremors, seizures, syncope, facial asymmetry, light-headedness, numbness and headaches.  Psychiatric/Behavioral: Positive for hallucinations. Negative for agitation, behavioral problems, confusion, decreased concentration, dysphoric mood, self-injury, sleep disturbance and suicidal ideas. The patient is nervous/anxious. The patient is not hyperactive.        Objective:   Physical Exam  Constitutional: She is oriented to person, place, and time. She appears well-developed and well-nourished. No distress.  HENT:  Head: Normocephalic and atraumatic.  Right Ear: External ear normal.  Left Ear: External ear normal.  Nose: Nose normal.  Mouth/Throat: Oropharynx is  clear and moist. No oropharyngeal exudate.  Eyes: Conjunctivae and EOM are normal. Pupils are equal, round, and reactive to light. No scleral icterus.  Neck: Normal range of motion. Neck supple. No JVD present. No thyromegaly present.  Cardiovascular: Normal rate, regular rhythm, normal heart sounds and intact distal pulses.  Exam reveals no gallop and no friction rub.   No murmur heard. Pulmonary/Chest: Effort normal and breath sounds normal. No respiratory distress. She has no wheezes. She has no rales. She exhibits no tenderness.  Abdominal: Soft. Bowel sounds are normal. She exhibits no distension and no mass. There is no tenderness. There is no rebound and no guarding.  Musculoskeletal: Normal range of motion. She exhibits no edema or tenderness.  The right TMJ has crepitus, but it is not tender and ROM is full   Lymphadenopathy:    She has no cervical adenopathy.  Neurological: She is alert and oriented to person, place, and time. She has normal reflexes. No cranial nerve deficit. She exhibits normal muscle tone. Coordination normal.  Skin: Skin is warm and dry. No rash noted. No erythema.  Psychiatric: She has a normal mood and affect. Her behavior is normal. Judgment and thought content normal.          Assessment & Plan:  Her HTN is stale. Get fasting labs today. She is having visual hallucinations and I think these are side effects of the Depakote. I instructed her to cut back to 2 tablets BID for one week and then go to one tablet BID. She will keep it at this level until she sees Neurology again next month. For the constipation I advised her to take Miralax every day. She has mild TMJ  but this does not require treatment at this point.   [name], MD

## 2016-09-25 ENCOUNTER — Telehealth: Payer: Self-pay | Admitting: Family Medicine

## 2016-09-25 NOTE — Telephone Encounter (Signed)
Flora need verbal orders for 1 week 1 for at home PT

## 2016-09-25 NOTE — Telephone Encounter (Signed)
Per Dr. Sarajane Jews okay and I spoke with Orland Mustard, gave verbal order for PT.

## 2016-10-04 ENCOUNTER — Other Ambulatory Visit: Payer: Self-pay | Admitting: Family Medicine

## 2016-10-04 NOTE — Telephone Encounter (Signed)
Call in #[phone] with 2 rf 

## 2016-10-11 ENCOUNTER — Ambulatory Visit (INDEPENDENT_AMBULATORY_CARE_PROVIDER_SITE_OTHER): Payer: Medicare Other | Admitting: Neurology

## 2016-10-11 ENCOUNTER — Encounter: Payer: Self-pay | Admitting: Neurology

## 2016-10-11 VITALS — BP 118/65 | HR 57 | Wt 137.0 lb

## 2016-10-11 DIAGNOSIS — I63432 Cerebral infarction due to embolism of left posterior cerebral artery: Secondary | ICD-10-CM | POA: Diagnosis not present

## 2016-10-11 NOTE — Progress Notes (Signed)
[facility] [address] [place]. [place] [address] [phone]       OFFICE FOLLOW-UP NOTE  Christine. Christine Middleton Date of Birth:  1922-08-31 Medical Record Number:  [id number]   HPI: Christine Middleton is a pleasant 83 year Caucasian lady seen today for the first office follow-up visit following hospital admission for stroke in January 2018. She is accompanied by her daughter today. History is obtain from the patient, daughter and review of Hospital medical records. I personally reviewed imaging films myself.Christine B Clymeris an 83 y.o.female LKW possibly at 930 am per son who noticed left face was "drawn". Patient herself thought her speech may have been off at one point but not sure when. She said she was not feeling well when she woke up that morning. She was caring for her husband as usual. She felt essentially back to baseline quickly. She did have a prior stroke of the right hemisphere that may have caused residual left sided symptoms. She was already on Plavix and claimed to be compliant. MRI scan of the brain showed multiple acute infarcts involving left medial occipital lobe, right frontal lobe. And hold him infarcts as well. MRA showed new proximal left posterior cerebral artery occlusion. LDL cholesterol 76 mg percent. Hemoglobin A1c was 5.8. Crit and dressing occlusion normal ejection fraction. Carotid Doppler showed no significant extracranial stenosis. EEG was normal. Patient was offered transesophageal echocardiogram as well as prolonged cardiac monitoring and loop recorder however the patient refused both. She also complained of vision disturbance in her periphery which were felt to be a typical migraine headaches. She was started on Depakote but patient stated she wasn't not able to tolerated due to hallucinations and side effects and her primary care physician has since reduced the dose to 125 mg twice daily which is tolerating well. She has seen ophthalmologist Dr. Katy Fitch  who found by matter restricted temporal visual fields. The patient's husband died in 10/25/2022 this year. She is noticed worsening of her memory with intermittent confusion as well as disorientation. She's also had trouble sleeping. She has had no recurrence stroke, TIA or seizure like episodes. She still lives independently and manages most of activities though daughter provides close supervision   ROS:   81 system review of systems is positive for activity and appetite change, hearing loss, and drooling, partial vision loss, leg swelling, constipation, incontinence of bowels, insomnia, memory loss, confusion, decreased concentration and all other systems negative  PMH:  Past Medical History:  Diagnosis Date  . Diverticulitis   . Esophageal stricture   . GERD (gastroesophageal reflux disease)   . Hyperlipidemia   . Hypertension   . Shingles   . Stroke ([place])   . Vitamin B 12 deficiency     Social History:  Social History   Social History  . Marital status: Married    Spouse name: Christine Congress. Bourque  . Number of children: 4  . Years of education: HS   Occupational History  . Retired Retired   Social History Main Topics  . Smoking status: Never Smoker  . Smokeless tobacco: Never Used  . Alcohol use No  . Drug use: No  . Sexual activity: Not on file   Other Topics Concern  . Not on file   Social History Narrative   Patient lives at home with family.   Patient is right handed.   Patient has a high school education.   Caffeine Use: 1 cup daily    Medications:  Current Outpatient Prescriptions on File Prior to Visit  Medication Sig Dispense Refill  . amLODipine (NORVASC) 5 MG tablet Take 1 tablet (5 mg total) by mouth daily. 90 tablet 3  . atenolol (TENORMIN) 100 MG tablet TAKE 1 TABLET (100 MG TOTAL) BY MOUTH DAILY. 90 tablet 2  . B Complex-C (SUPER B COMPLEX/VITAMIN C PO) Take 1 tablet by mouth daily.    . clopidogrel (PLAVIX) 75 MG tablet TAKE 1 TABLET BY MOUTH DAILY  WITH BREAKFAST 90 tablet 3  . divalproex (DEPAKOTE) 125 MG DR tablet Take 3 tablets (375 mg total) by mouth every 12 (twelve) hours. (Patient taking differently: Take 375 mg by mouth 2 (two) times daily. Take one tablet) 540 tablet 1  . losartan (COZAAR) 100 MG tablet Take 100 mg by mouth daily.    . pantoprazole (PROTONIX) 40 MG tablet TAKE 1 TABLET BY MOUTH EVERY DAY 90 tablet 1  . simvastatin (ZOCOR) 20 MG tablet TAKE 1 TABLET (20 MG TOTAL) BY MOUTH AT BEDTIME. 90 tablet 3  . traMADol (ULTRAM) 50 MG tablet TAKE 1 TABLET BY MOUTH EVERY 6 HOURS AS NEEDED FOR MODERATE PAIN (Patient taking differently: take 1/2 tab at night prn) 60 tablet 0   No current facility-administered medications on file prior to visit.     Allergies:   Allergies  Allergen Reactions  . Amoxicillin     REACTION: rash  . Codeine   . Lisinopril   . Nitrofurantoin     Physical Exam General: Frail elderly Caucasian lady, seated, in no evident distress Head: head normocephalic and atraumatic.  Neck: supple with no carotid or supraclavicular bruits Cardiovascular: regular rate and rhythm, no murmurs Musculoskeletal: no deformity Skin:  no rash/petichiae Vascular:  Normal pulses all extremities Vitals:   10/11/16 1546  BP: 118/65  Pulse: (!) 57   Neurologic Exam Mental Status: Awake and fully alert. Oriented to place and time. Recent and remote memory intact. Attention span, concentration and fund of knowledge Diminished Mood and affect appropriate. Diminished recall 2/3. Able to name on the 8 animals with four legs. Able to copy intersecting pentagons. Clock drawing 2/4. Cranial Nerves: Fundoscopic exam reveals sharp disc margins. Pupils equal, briskly reactive to light. Extraocular movements full without nystagmus. Visual fields Show restricted temporal visual fields bilaterally l to confrontation. Hearing diminished significantly bilaterally. Facial sensation intact. Face, tongue, palate moves normally and  symmetrically.  Motor: Normal bulk and tone. Normal strength in all tested extremity muscles. Sensory.: intact to touch ,pinprick .position and vibratory sensation.  Coordination: Rapid alternating movements normal in all extremities. Finger-to-nose and heel-to-shin performed accurately bilaterally. Gait and Station: Arises from chair without difficulty. Stance is normal. Gait demonstrates normal stride length and balance . Able to heel, toe and tandem walk without difficulty.  Reflexes: 1+ and symmetric. Toes downgoing.   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 Vascular risk factors of hypertension, hyperlipidemia, age and prior strokes. Mild post stroke cognitive impairment.    PLAN: I had a long d/w patient and her daughter about her  recent embolic strokes,mild cognitive impairment risk for recurrent stroke/TIAs, personally independently reviewed imaging studies and stroke evaluation results and answered questions.Continue Plavix for secondary stroke prevention and maintain strict control of hypertension with blood pressure goal below 130/90, diabetes with hemoglobin A1c goal below 6.5% and lipids with LDL cholesterol goal below 70 mg/dL. I also advised the patient to eat a  healthy diet with plenty of whole grains, cereals, fruits and vegetables, exercise regularly and maintain ideal body weight .I recommend she do mentally challenging activities like solving crossword puzzles, playing sudoku, word searches to help with the mild cognitive impairment. I expect this to improve over the next few months hence we will hold off on medications like Aricept given her advanced age. We discussed memory compensation strategies Followup in the future with my nurse practitioner or call earlier if necessary. Greater than 50% of time during this 25 minute visit was spent on counseling,explanation of diagnosis,  planning of further management, discussion with patient and family and coordination of care Antony Contras, MD  [facility] [address] [place],  [address]  Phone [phone] Fax [phone] Note: This document was prepared with digital dictation and possible smart phrase technology. Any transcriptional errors that result from this process are unintentional

## 2016-10-11 NOTE — Patient Instructions (Signed)
I had a long d/w patient and her daughter about her  recent embolic strokes,mild cognitive impairment risk for recurrent stroke/TIAs, personally independently reviewed imaging studies and stroke evaluation results and answered questions.Continue Plavix for secondary stroke prevention and maintain strict control of hypertension with blood pressure goal below 130/90, diabetes with hemoglobin A1c goal below 6.5% and lipids with LDL cholesterol goal below 70 mg/dL. I also advised the patient to eat a healthy diet with plenty of whole grains, cereals, fruits and vegetables, exercise regularly and maintain ideal body weight .I recommend she do mentally challenging activities like solving crossword puzzles, playing sudoku, word searches to help with the mild cognitive impairment. I expect this to improve over the next few months hence we will hold off on medications like Aricept given her advanced age. We discussed memory compensation strategies Followup in the future with my nurse practitioner or call earlier if necessary.  Memory Compensation Strategies  1. Use "WARM" strategy.  W= write it down  A= associate it  R= repeat it  M= make a mental note  2.   You can keep a Social worker.  Use a 3-ring notebook with sections for the following: calendar, important names and phone numbers,  medications, doctors' names/phone numbers, lists/reminders, and a section to journal what you did  each day.   3.    Use a calendar to write appointments down.  4.    Write yourself a schedule for the day.  This can be placed on the calendar or in a separate section of the Memory Notebook.  Keeping a  regular schedule can help memory.  5.    Use medication organizer with sections for each day or morning/evening pills.  You may need help loading it  6.    Keep a basket, or pegboard by the door.  Place items that you need to take out with you in the basket or on the pegboard.  You may also want to  include a message  board for reminders.  7.    Use sticky notes.  Place sticky notes with reminders in a place where the task is performed.  For example: " turn off the  stove" placed by the stove, "lock the door" placed on the door at eye level, " take your medications" on  the bathroom mirror or by the place where you normally take your medications.  8.    Use alarms/timers.  Use while cooking to remind yourself to check on food or as a reminder to take your medicine, or as a  reminder to make a call, or as a reminder to perform another task, etc.

## 2016-10-18 ENCOUNTER — Ambulatory Visit (INDEPENDENT_AMBULATORY_CARE_PROVIDER_SITE_OTHER): Payer: Medicare Other | Admitting: Family Medicine

## 2016-10-18 ENCOUNTER — Encounter: Payer: Self-pay | Admitting: Family Medicine

## 2016-10-18 VITALS — BP 136/68 | Temp 97.5°F | Ht 63.0 in | Wt 134.0 lb

## 2016-10-18 DIAGNOSIS — I10 Essential (primary) hypertension: Secondary | ICD-10-CM | POA: Diagnosis not present

## 2016-10-18 DIAGNOSIS — I639 Cerebral infarction, unspecified: Secondary | ICD-10-CM | POA: Diagnosis not present

## 2016-10-18 DIAGNOSIS — R600 Localized edema: Secondary | ICD-10-CM | POA: Diagnosis not present

## 2016-10-18 NOTE — Patient Instructions (Signed)
WE NOW OFFER   Christine Middleton's FAST TRACK!!!  SAME DAY Appointments for ACUTE CARE  Such as: Sprains, Injuries, cuts, abrasions, rashes, muscle pain, joint pain, back pain Colds, flu, sore throats, headache, allergies, cough, fever  Ear pain, sinus and eye infections Abdominal pain, nausea, vomiting, diarrhea, upset stomach Animal/insect bites  3 Easy Ways to Schedule: Walk-In Scheduling Call in scheduling Mychart Sign-up: [email or web address]         

## 2016-10-18 NOTE — Progress Notes (Signed)
Pre visit review using our clinic review tool, if applicable. No additional management support is needed unless otherwise documented below in the visit note. 

## 2016-10-18 NOTE — Progress Notes (Signed)
   Subjective:    Patient ID: Christine Middleton, female    DOB: [birth year], 83 y.o.   MRN: [id number]  HPI Here to discuss some swelling in the feet and ankles that appeared about a month ago. She feels pressure in the feet but not true pain. No SOB. Her BP is stable. She saw Dr. Leonie Man recently and he was pleased with her neurologic state. She is still on Depakote 125 mg bid. She is tolerating this lower dose well and the hallucinations have stopped.    Review of Systems  Constitutional: Negative.   Respiratory: Negative.   Cardiovascular: Positive for leg swelling. Negative for chest pain and palpitations.  Neurological: Negative.        Objective:   Physical Exam  Constitutional: She is oriented to person, place, and time. She appears well-developed and well-nourished. No distress.  Cardiovascular: Normal rate, regular rhythm, normal heart sounds and intact distal pulses.   Pulmonary/Chest: Effort normal and breath sounds normal.  Musculoskeletal:  2+ edema to both ankles and feet   Neurological: She is alert and oriented to person, place, and time.          Assessment & Plan:  She is doing well after her strokes. Her HTN is stable. She has some pedal edema, and I think this is due in part to the Amlodipine she is taking. She will begin wearing compression stockings during the day and follow up prn.  [name], MD

## 2016-10-27 ENCOUNTER — Other Ambulatory Visit: Payer: Self-pay | Admitting: Family Medicine

## 2016-11-25 ENCOUNTER — Other Ambulatory Visit: Payer: Self-pay | Admitting: Family Medicine

## 2016-11-27 NOTE — Telephone Encounter (Signed)
Looks like this was discontinued? See note on pharmacy request.

## 2016-12-29 ENCOUNTER — Other Ambulatory Visit: Payer: Self-pay | Admitting: Family Medicine

## 2017-01-17 ENCOUNTER — Ambulatory Visit (INDEPENDENT_AMBULATORY_CARE_PROVIDER_SITE_OTHER): Payer: Medicare Other | Admitting: Nurse Practitioner

## 2017-01-17 ENCOUNTER — Encounter: Payer: Self-pay | Admitting: Nurse Practitioner

## 2017-01-17 VITALS — BP 131/77 | HR 75 | Ht 63.0 in | Wt 131.6 lb

## 2017-01-17 DIAGNOSIS — I10 Essential (primary) hypertension: Secondary | ICD-10-CM

## 2017-01-17 DIAGNOSIS — I639 Cerebral infarction, unspecified: Secondary | ICD-10-CM | POA: Diagnosis not present

## 2017-01-17 DIAGNOSIS — E782 Mixed hyperlipidemia: Secondary | ICD-10-CM

## 2017-01-17 NOTE — Progress Notes (Signed)
[facility]  PATIENT: Christine Middleton DOB: 1923/03/20   REASON FOR VISIT: Follow-up for stroke, multiple acute infarcts involving left medial occipital lobe right frontal lobe in January 2018 HISTORY FROM: Patient and son    HISTORY OF PRESENT ILLNESS: HISTORY 10/11/16 PSMs Christine Middleton is a pleasant 41 year Caucasian lady seen today for the first office follow-up visit following hospital admission for stroke in January 2018. She is accompanied by her daughter today. History is obtain from the patient, daughter and review of Hospital medical records. I personally reviewed imaging films myself.Christine B Clymeris an 83 y.o.female LKW possibly at 930 am per son who noticed left face was "drawn". Patient herself thought her speech may have been offat one point but not sure when. She said she was not feeling well when she woke up that morning. She was caring for her husband as usual. She felt essentially back to baseline quickly.She did have a prior stroke of the right hemispherethat may have caused residual left sided symptoms. She was already on Plavixand claimed to be compliant. MRI scan of the brain showed multiple acute infarcts involving left medial occipital lobe, right frontal lobe. And hold him infarcts as well. MRA showed new proximal left posterior cerebral artery occlusion. LDL cholesterol 76 mg percent. Hemoglobin A1c was 5.8. Crit and dressing occlusion normal ejection fraction. Carotid Doppler showed no significant extracranial stenosis. EEG was normal. Patient was offered transesophageal echocardiogram as well as prolonged cardiac monitoring and loop recorder however the patient refused both. She also complained of vision disturbance in her periphery which were felt to be a typical migraine headaches. She was started on Depakote but patient stated she wasn't not able to tolerated due to hallucinations and side effects and her primary care physician has since reduced the  dose to 125 mg twice daily which is tolerating well. She has seen ophthalmologist Dr. Katy Fitch who found by matter restricted temporal visual fields. The patient's husband died in Oct 21, 2022 this year. She is noticed worsening of her memory with intermittent confusion as well as disorientation. She's also had trouble sleeping. She has had no recurrence stroke, TIA or seizure like episodes. She still lives independently and manages most of activities though daughter provides close supervision  UPDATE 06/20/2018[name], 83 year old female returns for follow-up with history of stroke in January 2018. She is currently on Plavix for secondary stroke prevention without further stroke or TIA symptoms or seizure-type events. She has minimal bruising and bleeding she remains on Zocor for hyperlipidemia without complaints of myalgias. Blood pressure the office today 131/77 She had a visual disturbance in the periphery while hospitalized which was felt to be a typical migraine and was started on Depakote. She is currently taking 125 mg twice a day. She is due to see her ophthalmologist next month. She continues to have some mild cognitive impairment however she says she does cross words she continues to cook and live independently in her home with family checking on her. Her daughter does the finances. She returns for reevaluation REVIEW OF SYSTEMS: Full 14 system review of systems performed and notable only for those listed, all others are neg:  Constitutional: neg  Cardiovascular: neg Ear/Nose/Throat: Hearing loss  Skin: neg Eyes: Blurred vision Respiratory: neg Gastroitestinal: neg  Hematology/Lymphatic: neg  Endocrine: neg Musculoskeletal: Back pain Allergy/Immunology: neg Neurological: Memory loss Psychiatric: neg Sleep : neg   ALLERGIES: Allergies  Allergen Reactions  . Amoxicillin     REACTION: rash  . Codeine   .  Lipitor [Atorvastatin]     Muscle pain, stiff joints  . Lisinopril   .  Nitrofurantoin     HOME MEDICATIONS: Outpatient Medications Prior to Visit  Medication Sig Dispense Refill  . amLODipine (NORVASC) 5 MG tablet Take 1 tablet (5 mg total) by mouth daily. 90 tablet 3  . atenolol (TENORMIN) 50 MG tablet TAKE 2 TABLETS BY MOUTH EVERY DAY 180 tablet 3  . B Complex-C (SUPER B COMPLEX/VITAMIN C PO) Take 1 tablet by mouth daily.    . clopidogrel (PLAVIX) 75 MG tablet TAKE 1 TABLET BY MOUTH DAILY WITH BREAKFAST 90 tablet 3  . divalproex (DEPAKOTE) 125 MG DR tablet Take 3 tablets (375 mg total) by mouth every 12 (twelve) hours. (Patient taking differently: Take 125 mg by mouth 2 (two) times daily. Take one tablet) 540 tablet 1  . losartan (COZAAR) 100 MG tablet TAKE 1 TABLET EVERY DAY 90 tablet 3  . pantoprazole (PROTONIX) 40 MG tablet TAKE 1 TABLET BY MOUTH EVERY DAY 90 tablet 1  . simvastatin (ZOCOR) 20 MG tablet TAKE 1 TABLET (20 MG TOTAL) BY MOUTH AT BEDTIME. 90 tablet 3  . traMADol (ULTRAM) 50 MG tablet TAKE 1 TABLET BY MOUTH EVERY 6 HOURS AS NEEDED FOR MODERATE PAIN (Patient taking differently: take 1/2 tab at night prn) 60 tablet 0  . losartan (COZAAR) 100 MG tablet Take 100 mg by mouth daily.     No facility-administered medications prior to visit.     PAST MEDICAL HISTORY: Past Medical History:  Diagnosis Date  . Diverticulitis   . Esophageal stricture   . GERD (gastroesophageal reflux disease)   . Hyperlipidemia   . Hypertension   . Shingles   . Stroke ([place])   . Vitamin B 12 deficiency     PAST SURGICAL HISTORY: Past Surgical History:  Procedure Laterality Date  . ABDOMINAL HYSTERECTOMY    . APPENDECTOMY    . COLONOSCOPY  12-09-08   per Dr. Sharlett Iles (incomplete along with barium enema 12-10-08) with hemorrhoids  and diverticulae  . ESOPHAGOGASTRODUODENOSCOPY  09-2004   per Dr. Sharlett Iles with dilatation   . EYE SURGERY    . TONSILLECTOMY      FAMILY HISTORY: Family History  Problem Relation Age of Onset  . Heart attack Mother   . Heart  disease Father   . Breast cancer Unknown   . Diabetes Unknown   . Hypertension Unknown   . Stroke Unknown   . Heart disease Unknown   . Stroke Sister   . Cancer Brother   . Cancer Brother   . Diabetes Brother     SOCIAL HISTORY: Social History   Social History  . Marital status: Married    Spouse name: Grayling Congress. Lennon  . Number of children: 4  . Years of education: HS   Occupational History  . Retired Retired   Social History Main Topics  . Smoking status: Never Smoker  . Smokeless tobacco: Never Used  . Alcohol use No  . Drug use: No  . Sexual activity: Not on file   Other Topics Concern  . Not on file   Social History Narrative   Patient lives at home with family.   Patient is right handed.   Patient has a high school education.   Caffeine Use: 1 cup daily     PHYSICAL EXAM  Vitals:   01/17/17 1246  BP: 131/77  Pulse: 75  Weight: 131 lb 9.6 oz (59.7 kg)  Height: 5\' 3"  (1.6 m)  Body mass index is 23.31 kg/m.  Generalized: Well developed, in no acute distress  Head: normocephalic and atraumatic,. Oropharynx benign  Neck: Supple, no carotid bruits  Cardiac: Regular rate rhythm, no murmur  Musculoskeletal: No deformity   Neurological examination   Mentation: Alert oriented to time, place, history taking. Attention span and concentration appropriate. Diminished recall 2 out of 3   Follows all commands speech and language fluent.   Cranial nerve II-XII: Pupils were equal round reactive to light extraocular movements were full, Visual fields Show restricted temporal visual fields bilaterally  to confrontation Facial sensation and strength were normal. hearing was intact to finger rubbing bilaterally. Uvula tongue midline. head turning and shoulder shrug were normal and symmetric.Tongue protrusion into cheek strength was normal. Motor: normal bulk and tone, full strength in the BUE, BLE, fine finger movements normal, no pronator drift. No focal  weakness Sensory: normal and symmetric to light touch, pinprick, and  Vibration, in the upper and lower extremities Coordination: finger-nose-finger, heel-to-shin bilaterally, no dysmetria Reflexes: 1+ upper lower and symmetric, plantar responses were flexor bilaterally. Gait and Station: Rising up from seated position without assistance, normal stance,  moderate stride, good arm swing, smooth turning, able to perform tiptoe, and heel walking without difficulty. Tandem gait is steady. No assistive device  DIAGNOSTIC DATA (LABS, IMAGING, TESTING) - I reviewed patient records, labs, notes, testing and imaging myself where available.  Lab Results  Component Value Date   WBC 7.7 09/18/2016   HGB 12.8 09/18/2016   HCT 38.3 09/18/2016   MCV 92.3 09/18/2016   PLT 181.0 09/18/2016      Component Value Date/Time   NA 143 09/18/2016 1112   K 3.9 09/18/2016 1112   CL 104 09/18/2016 1112   CO2 31 09/18/2016 1112   GLUCOSE 85 09/18/2016 1112   GLUCOSE 104 (H) 05/28/2006 1104   BUN 19 09/18/2016 1112   CREATININE 0.81 09/18/2016 1112   CALCIUM 8.9 09/18/2016 1112   PROT 6.4 09/18/2016 1112   ALBUMIN 3.9 09/18/2016 1112   AST 13 09/18/2016 1112   ALT 11 09/18/2016 1112   ALKPHOS 53 09/18/2016 1112   BILITOT 0.6 09/18/2016 1112   GFRNONAA 48 (L) 08/18/2016 1033   GFRAA 56 (L) 08/18/2016 1033   Lab Results  Component Value Date   CHOL 163 09/18/2016   HDL 77.60 09/18/2016   LDLCALC 72 09/18/2016   LDLDIRECT 113.3 07/18/2012   TRIG 67.0 09/18/2016   CHOLHDL 2 09/18/2016   Lab Results  Component Value Date   HGBA1C 5.8 (H) 08/19/2016   Lab Results  Component Value Date   VITAMINB12 245 07/28/2014   Lab Results  Component Value Date   TSH 1.34 09/18/2016      ASSESSMENT AND PLAN 36 year Caucasian lady With 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 Vascular risk factors of hypertension, hyperlipidemia, age and prior strokes. Mild post stroke  cognitive impairment.The patient is a current patient of Dr. Leonie Man  who is out of the office today . This note is sent to the work in doctor.      Stressed the importance of management of risk factors to prevent further stroke Continue Plavix for secondary stroke prevention Maintain strict control of hypertension with blood pressure goal below 130/90, today's reading 131/77 continue antihypertensive medications Cholesterol with LDL cholesterol less than 70, followed by primary care, Zocor Be active  eat healthy diet with whole grains,  fresh fruits and vegetables Continue Depakote as ordered Participate  in cognitively stimulating activities such as crossword puzzles, word search etc. Follow-up in 6 months if stable will dismiss Discussed risk for recurrent stroke/ TIA and answered additional questions for patient and son This was a  visit requiring 25 minutes . Of  medical decision making of high complexity with extensive review of history, hospital chart, counseling and answering questions Dennie Bible, [facility], [facility], APRN  [facility] [address], [place], [place] [address] [phone]

## 2017-01-17 NOTE — Patient Instructions (Signed)
Stressed the importance of management of risk factors to prevent further stroke Continue Plavix for secondary stroke prevention Maintain strict control of hypertension with blood pressure goal below 130/90, today's reading 131/77 continue antihypertensive medications Cholesterol with LDL cholesterol less than 70, followed by primary care, Zocor Be active  eat healthy diet with whole grains,  fresh fruits and vegetables Continue Depakote as ordered Follow-up in 6 months

## 2017-01-17 NOTE — Progress Notes (Signed)
I have read the note, and I agree with the clinical assessment and plan.  Christine Middleton,Christine Middleton   

## 2017-02-02 DIAGNOSIS — H53461 Homonymous bilateral field defects, right side: Secondary | ICD-10-CM | POA: Diagnosis not present

## 2017-02-02 DIAGNOSIS — H353131 Nonexudative age-related macular degeneration, bilateral, early dry stage: Secondary | ICD-10-CM | POA: Diagnosis not present

## 2017-02-02 DIAGNOSIS — I63532 Cerebral infarction due to unspecified occlusion or stenosis of left posterior cerebral artery: Secondary | ICD-10-CM | POA: Diagnosis not present

## 2017-02-21 ENCOUNTER — Ambulatory Visit: Payer: Medicare Other

## 2017-04-16 ENCOUNTER — Telehealth: Payer: Self-pay | Admitting: Family Medicine

## 2017-04-16 NOTE — Telephone Encounter (Signed)
This should come from her neurologist, Dr. Margette Fast

## 2017-04-16 NOTE — Telephone Encounter (Signed)
Can you please clarify dose and directions for Depakote 125MG  and I will need to [facility]?

## 2017-04-17 ENCOUNTER — Telehealth: Payer: Self-pay | Admitting: Nurse Practitioner

## 2017-04-17 MED ORDER — DIVALPROEX SODIUM 125 MG PO DR TAB: 125.0000 mg | DELAYED_RELEASE_TABLET | Freq: Two times a day (BID) | ORAL | 6 refills | Status: DC

## 2017-04-17 NOTE — Telephone Encounter (Signed)
I didn't understand your last message, I changed the dose in the system when she was seen by me. It was renewed.

## 2017-04-17 NOTE — Telephone Encounter (Signed)
Heidi/[facility] [address] [phone] called said the pt is taking Depakote DR 125 and needs a refill. When she contacted Dr Barbie Banner office they advised it should be coming from Dr Jannifer Franklin. Heidi said it will be new RX and new directions, she said the patient told her she was instructed to take 1 tab bid by Dr Sarajane Jews. Please call

## 2017-04-17 NOTE — Addendum Note (Signed)
Addended by: Otilio Jefferson on: 04/17/2017 04:47 PM   Modules accepted: Orders

## 2017-04-17 NOTE — Telephone Encounter (Signed)
Spoke with Verdis Frederickson, [facility]ist at [facility] and advised her the new Rx has been sent in. She checked and confirmed they received it; read Rx and it was correct. She verbalized appreciation for call.

## 2017-04-17 NOTE — Telephone Encounter (Signed)
OK to refill looks like Christine Middleton started it in the hospital.

## 2017-04-18 NOTE — Telephone Encounter (Signed)
I spoke with pt and script had already been sent in by Dr. Jannifer Franklin.

## 2017-04-19 ENCOUNTER — Encounter: Payer: Self-pay | Admitting: Family Medicine

## 2017-06-29 ENCOUNTER — Other Ambulatory Visit: Payer: Self-pay | Admitting: Family Medicine

## 2017-07-08 ENCOUNTER — Other Ambulatory Visit: Payer: Self-pay | Admitting: Family Medicine

## 2017-07-17 NOTE — Progress Notes (Signed)
[facility]  PATIENT: Christine Middleton DOB: Jun 13, 1923   REASON FOR VISIT: Follow-up for stroke, multiple acute infarcts involving left medial occipital lobe right frontal lobe in January 2018 HISTORY FROM: Patient and daughter Christine Middleton    HISTORY OF PRESENT ILLNESS: HISTORY 10/11/16 PSMs Christine Middleton is a pleasant 55 year Caucasian lady seen today for the first office follow-up visit following hospital admission for stroke in January 2018. She is accompanied by her daughter today. History is obtain from the patient, daughter and review of Hospital medical records. I personally reviewed imaging films myself.Christine Middleton an 83 y.o.female LKW possibly at 930 am per son who noticed left face was "drawn". Patient herself thought her speech may have been offat one point but not sure when. She said she was not feeling well when she woke up that morning. She was caring for her husband as usual. She felt essentially back to baseline quickly.She did have a prior stroke of the right hemispherethat may have caused residual left sided symptoms. She was already on Plavixand claimed to be compliant. MRI scan of the brain showed multiple acute infarcts involving left medial occipital lobe, right frontal lobe. And hold him infarcts as well. MRA showed new proximal left posterior cerebral artery occlusion. LDL cholesterol 76 mg percent. Hemoglobin A1c was 5.8. Crit and dressing occlusion normal ejection fraction. Carotid Doppler showed no significant extracranial stenosis. EEG was normal. Patient was offered transesophageal echocardiogram as well as prolonged cardiac monitoring and loop recorder however the patient refused both. She also complained of vision disturbance in her periphery which were felt to be a typical migraine headaches. She was started on Depakote but patient stated she wasn't not able to tolerated due to hallucinations and side effects and her primary care physician has since  reduced the dose to 125 mg twice daily which is tolerating well. She has seen ophthalmologist Dr. Katy Fitch who found by matter restricted temporal visual fields. The patient's husband died in 10/21/22 this year. She is noticed worsening of her memory with intermittent confusion as well as disorientation. She's also had trouble sleeping. She has had no recurrence stroke, TIA or seizure like episodes. She still lives independently and manages most of activities though daughter provides close supervision  UPDATE 06/20/2018[name], 83 year old female returns for follow-up with history of stroke in January 2018. She is currently on Plavix for secondary stroke prevention without further stroke or TIA symptoms or seizure-type events. She has minimal bruising and bleeding she remains on Zocor for hyperlipidemia without complaints of myalgias. Blood pressure the office today 131/77 She had a visual disturbance in the periphery while hospitalized which was felt to be a typical migraine and was started on Depakote. She is currently taking 125 mg twice a day. She is due to see her ophthalmologist next month. She continues to have some mild cognitive impairment however she says she does cross words she continues to cook and live independently in her home with family checking on her. Her daughter does the finances. She returns for reevaluation UPDATE 12/19/2018[name], 83 year old female returns for follow-up with history of stroke in January 2018.  She has not had further stroke or TIA symptoms since that time.  She remains on Plavix for secondary stroke prevention, no bleeding and minimal bruising noted.  She is on simvastatin without myalgias blood pressure in the office today 132/74.  She remains active.  She continues to do her yard work she continues to have some peripheral vision  issues on the left and she follows up with Dr. Carolynn Sayers.  Her mild cognitive impairment is stable.  She continues to live independently  with family checking on her.  She no longer drives.  She wants to taper and discontinue her Depakote which was given to her for suspected visual migraines.  She returns for reevaluation   REVIEW OF SYSTEMS: Full 14 system review of systems performed and notable only for those listed, all others are neg:  Constitutional: neg  Cardiovascular: neg Ear/Nose/Throat: Hearing loss  Skin: neg Eyes: Blurred vision Respiratory: neg Gastroitestinal: neg  Hematology/Lymphatic: neg  Endocrine: neg Musculoskeletal: neg Allergy/Immunology: neg Neurological: neg Psychiatric: neg Sleep : neg   ALLERGIES: Allergies  Allergen Reactions  . Amoxicillin     REACTION: rash  . Codeine   . Lipitor [Atorvastatin]     Muscle pain, stiff joints  . Lisinopril   . Nitrofurantoin     HOME MEDICATIONS: Outpatient Medications Prior to Visit  Medication Sig Dispense Refill  . amLODipine (NORVASC) 5 MG tablet Take 1 tablet (5 mg total) by mouth daily. 90 tablet 3  . atenolol (TENORMIN) 50 MG tablet TAKE 2 TABLETS BY MOUTH EVERY DAY 180 tablet 3  . clopidogrel (PLAVIX) 75 MG tablet TAKE 1 TABLET BY MOUTH DAILY WITH BREAKFAST 90 tablet 0  . divalproex (DEPAKOTE) 125 MG DR tablet Take 1 tablet (125 mg total) by mouth 2 (two) times daily. Take one tablet 60 tablet 6  . losartan (COZAAR) 100 MG tablet TAKE 1 TABLET EVERY DAY 90 tablet 3  . pantoprazole (PROTONIX) 40 MG tablet TAKE 1 TABLET BY MOUTH EVERY DAY 90 tablet 1  . simvastatin (ZOCOR) 20 MG tablet TAKE 1 TABLET (20 MG TOTAL) BY MOUTH AT BEDTIME. 90 tablet 3  . B Complex-C (SUPER B COMPLEX/VITAMIN C PO) Take 1 tablet by mouth daily.    . traMADol (ULTRAM) 50 MG tablet TAKE 1 TABLET BY MOUTH EVERY 6 HOURS AS NEEDED FOR MODERATE PAIN (Patient taking differently: take 1/2 tab at night prn) 60 tablet 0   No facility-administered medications prior to visit.     PAST MEDICAL HISTORY: Past Medical History:  Diagnosis Date  . Diverticulitis   . Esophageal  stricture   . GERD (gastroesophageal reflux disease)   . Hyperlipidemia   . Hypertension   . Shingles   . Stroke ([place])   . Vitamin B 12 deficiency     PAST SURGICAL HISTORY: Past Surgical History:  Procedure Laterality Date  . ABDOMINAL HYSTERECTOMY    . APPENDECTOMY    . COLONOSCOPY  12-09-08   per Dr. Sharlett Iles (incomplete along with barium enema 12-10-08) with hemorrhoids  and diverticulae  . ESOPHAGOGASTRODUODENOSCOPY  09-2004   per Dr. Sharlett Iles with dilatation   . EYE SURGERY    . TONSILLECTOMY      FAMILY HISTORY: Family History  Problem Relation Age of Onset  . Heart attack Mother   . Heart disease Father   . Breast cancer Unknown   . Diabetes Unknown   . Hypertension Unknown   . Stroke Unknown   . Heart disease Unknown   . Stroke Sister   . Cancer Brother   . Cancer Brother   . Diabetes Brother     SOCIAL HISTORY: Social History   Socioeconomic History  . Marital status: Married    Spouse name: Grayling Congress. Marcos  . Number of children: 4  . Years of education: HS  . Highest education level: Not on file  Social Needs  . Financial resource strain: Not on file  . Food insecurity - worry: Not on file  . Food insecurity - inability: Not on file  . Transportation needs - medical: Not on file  . Transportation needs - non-medical: Not on file  Occupational History  . Occupation: Retired    Fish farm manager: RETIRED  Tobacco Use  . Smoking status: Never Smoker  . Smokeless tobacco: Never Used  Substance and Sexual Activity  . Alcohol use: No    Alcohol/week: 0.0 oz  . Drug use: No  . Sexual activity: Not on file  Other Topics Concern  . Not on file  Social History Narrative   Patient lives at home with family.   Patient is right handed.   Patient has a high school education.   Caffeine Use: 1 cup daily     PHYSICAL EXAM  Vitals:   07/18/17 1331  BP: 132/74  Pulse: 75  Weight: 129 lb 9.6 oz (58.8 kg)  Height: 5\' 3"  (1.6 m)   Body mass index is  22.96 kg/m.  Generalized: Well developed, in no acute distress  Head: normocephalic and atraumatic,. Oropharynx benign  Neck: Supple, no carotid bruits  Cardiac: Regular rate rhythm, no murmur  Musculoskeletal: No deformity   Neurological examination   Mentation: Alert oriented to time, place, history taking. Attention span and concentration appropriate.    Follows all commands speech and language fluent.   Cranial nerve II-XII: Pupils were equal round reactive to light extraocular movements were full, Visual fields Show restricted temporal visual fields bilaterally  to confrontation Facial sensation and strength were normal. hearing was intact to finger rubbing bilaterally. Uvula tongue midline. head turning and shoulder shrug were normal and symmetric.Tongue protrusion into cheek strength was normal. Motor: normal bulk and tone, full strength in the BUE, BLE, fine finger movements normal, no pronator drift. No focal weakness Sensory: normal and symmetric to light touch,  in the upper and lower extremities Coordination: finger-nose-finger, heel-to-shin bilaterally, no dysmetria, no tremor Reflexes: 1+ upper lower and symmetric, plantar responses were flexor bilaterally. Gait and Station: Rising up from seated position without assistance, normal stance,  moderate stride, good arm swing, smooth turning, able to perform tiptoe, and heel walking without difficulty. Tandem gait is unsteady. No assistive device  DIAGNOSTIC DATA (LABS, IMAGING, TESTING) - I reviewed patient records, labs, notes, testing and imaging myself where available.  Lab Results  Component Value Date   WBC 7.7 09/18/2016   HGB 12.8 09/18/2016   HCT 38.3 09/18/2016   MCV 92.3 09/18/2016   PLT 181.0 09/18/2016      Component Value Date/Time   NA 143 09/18/2016 1112   K 3.9 09/18/2016 1112   CL 104 09/18/2016 1112   CO2 31 09/18/2016 1112   GLUCOSE 85 09/18/2016 1112   GLUCOSE 104 (H) 05/28/2006 1104   BUN 19  09/18/2016 1112   CREATININE 0.81 09/18/2016 1112   CALCIUM 8.9 09/18/2016 1112   PROT 6.4 09/18/2016 1112   ALBUMIN 3.9 09/18/2016 1112   AST 13 09/18/2016 1112   ALT 11 09/18/2016 1112   ALKPHOS 53 09/18/2016 1112   BILITOT 0.6 09/18/2016 1112   GFRNONAA 48 (L) 08/18/2016 1033   GFRAA 56 (L) 08/18/2016 1033   Lab Results  Component Value Date   CHOL 163 09/18/2016   HDL 77.60 09/18/2016   LDLCALC 72 09/18/2016   LDLDIRECT 113.3 07/18/2012   TRIG 67.0 09/18/2016   CHOLHDL 2 09/18/2016   Lab Results  Component Value Date   HGBA1C 5.8 (H) 08/19/2016    Lab Results  Component Value Date   TSH 1.34 09/18/2016      ASSESSMENT AND PLAN 46 year Caucasian lady With 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 Vascular risk factors of hypertension, hyperlipidemia, age and prior strokes. Mild post stroke cognitive impairment which is stable.    PLAN: Stressed the importance of management of risk factors to prevent further stroke Continue Plavix for secondary stroke prevention Maintain strict control of hypertension with blood pressure goal below 130/90, today's reading 132/74 continue antihypertensive medications Cholesterol with LDL cholesterol less than 70, followed by primary care, continue Zocor Be active   eat healthy diet with whole grains,  fresh fruits and vegetables Taper  Depakote  By 1 capsule for 2 weeks then discontinued. Participate in cognitively stimulating activities such as crossword puzzles, word search etc. Discharge from stroke clinic I spent 25 minutes in total face to face time with the patient/daughter more than 50% of which was spent counseling and coordination of care, reviewing test results reviewing medications and discussing and reviewing the diagnosis of stroke and management of risk factors.  Given written information Dennie Bible, [facility], [facility], APRN  [facility] [address], [place], [place] [address] [phone]

## 2017-07-18 ENCOUNTER — Encounter: Payer: Self-pay | Admitting: Nurse Practitioner

## 2017-07-18 ENCOUNTER — Ambulatory Visit: Payer: Medicare Other | Admitting: Nurse Practitioner

## 2017-07-18 VITALS — BP 132/74 | HR 75 | Ht 63.0 in | Wt 129.6 lb

## 2017-07-18 DIAGNOSIS — I10 Essential (primary) hypertension: Secondary | ICD-10-CM

## 2017-07-18 DIAGNOSIS — I639 Cerebral infarction, unspecified: Secondary | ICD-10-CM

## 2017-07-18 DIAGNOSIS — E782 Mixed hyperlipidemia: Secondary | ICD-10-CM

## 2017-07-18 NOTE — Patient Instructions (Addendum)
Stressed the importance of management of risk factors to prevent further stroke Continue Plavix for secondary stroke prevention Maintain strict control of hypertension with blood pressure goal below 130/90, today's reading 132/74 continue antihypertensive medications Cholesterol with LDL cholesterol less than 70, followed by primary care, Zocor Be active  eat healthy diet with whole grains,  fresh fruits and vegetables Taper  Depakote  By 1 capsule for 2 weeks then discontinued. Participate in cognitively stimulating activities such as crossword puzzles, word search etc. Discharge from stroke clinic  Stroke Prevention Some medical conditions and behaviors are associated with a higher chance of having a stroke. You can help prevent a stroke by making nutrition, lifestyle, and other changes, including managing any medical conditions you may have. What nutrition changes can be made?  Eat healthy foods. You can do this by: ? Choosing foods high in fiber, such as fresh fruits and vegetables and whole grains. ? Eating at least 5 or more servings of fruits and vegetables a day. Try to fill half of your plate at each meal with fruits and vegetables. ? Choosing lean protein foods, such as lean cuts of meat, poultry without skin, fish, tofu, beans, and nuts. ? Eating low-fat dairy products. ? Avoiding foods that are high in salt (sodium). This can help lower blood pressure. ? Avoiding foods that have saturated fat, trans fat, and cholesterol. This can help prevent high cholesterol. ? Avoiding processed and premade foods.  Follow your health care provider's specific guidelines for losing weight, controlling high blood pressure (hypertension), lowering high cholesterol, and managing diabetes. These may include: ? Reducing your daily calorie intake. ? Limiting your daily sodium intake to 1,500 milligrams (mg). ? Using only healthy fats for cooking, such as olive oil, canola oil, or sunflower  oil. ? Counting your daily carbohydrate intake. What lifestyle changes can be made?  Maintain a healthy weight. Talk to your health care provider about your ideal weight.  Get at least 30 minutes of moderate physical activity at least 5 days a week. Moderate activity includes brisk walking, biking, and swimming.  Do not use any products that contain nicotine or tobacco, such as cigarettes and e-cigarettes. If you need help quitting, ask your health care provider. It may also be helpful to avoid exposure to secondhand smoke.  Limit alcohol intake to no more than 1 drink a day for nonpregnant women and 2 drinks a day for men. One drink equals 12 oz of beer, 5 oz of wine, or 1 oz of hard liquor.  Stop any illegal drug use.  Avoid taking birth control pills. Talk to your health care provider about the risks of taking birth control pills if: ? You are over 25 years old. ? You smoke. ? You get migraines. ? You have ever had a blood clot. What other changes can be made?  Manage your cholesterol levels. ? Eating a healthy diet is important for preventing high cholesterol. If cholesterol cannot be managed through diet alone, you may also need to take medicines. ? Take any prescribed medicines to control your cholesterol as told by your health care provider.  Manage your diabetes. ? Eating a healthy diet and exercising regularly are important parts of managing your blood sugar. If your blood sugar cannot be managed through diet and exercise, you may need to take medicines. ? Take any prescribed medicines to control your diabetes as told by your health care provider.  Control your hypertension. ? To reduce your risk of stroke, try  to keep your blood pressure below 130/80. ? Eating a healthy diet and exercising regularly are an important part of controlling your blood pressure. If your blood pressure cannot be managed through diet and exercise, you may need to take medicines. ? Take any  prescribed medicines to control hypertension as told by your health care provider. ? Ask your health care provider if you should monitor your blood pressure at home. ? Have your blood pressure checked every year, even if your blood pressure is normal. Blood pressure increases with age and some medical conditions.  Get evaluated for sleep disorders (sleep apnea). Talk to your health care provider about getting a sleep evaluation if you snore a lot or have excessive sleepiness.  Take over-the-counter and prescription medicines only as told by your health care provider. Aspirin or blood thinners (antiplatelets or anticoagulants) may be recommended to reduce your risk of forming blood clots that can lead to stroke.  Make sure that any other medical conditions you have, such as atrial fibrillation or atherosclerosis, are managed. What are the warning signs of a stroke? The warning signs of a stroke can be easily remembered as BEFAST.  B is for balance. Signs include: ? Dizziness. ? Loss of balance or coordination. ? Sudden trouble walking.  E is for eyes. Signs include: ? A sudden change in vision. ? Trouble seeing.  F is for face. Signs include: ? Sudden weakness or numbness of the face. ? The face or eyelid drooping to one side.  A is for arms. Signs include: ? Sudden weakness or numbness of the arm, usually on one side of the body.  S is for speech. Signs include: ? Trouble speaking (aphasia). ? Trouble understanding.  T is for time. ? These symptoms may represent a serious problem that is an emergency. Do not wait to see if the symptoms will go away. Get medical help right away. Call your local emergency services (911 in the [place] Do not drive yourself to the hospital.  Other signs of stroke may include: ? A sudden, severe headache with no known cause. ? Nausea or vomiting. ? Seizure.  Where to find more information: For more information, visit:  American Stroke  Association: www.strokeassociation.org  National Stroke Association: www.stroke.org  Summary  You can prevent a stroke by eating healthy, exercising, not smoking, limiting alcohol intake, and managing any medical conditions you may have.  Do not use any products that contain nicotine or tobacco, such as cigarettes and e-cigarettes. If you need help quitting, ask your health care provider. It may also be helpful to avoid exposure to secondhand smoke.  Remember BEFAST for warning signs of stroke. Get help right away if you or a loved one has any of these signs. This information is not intended to replace advice given to you by your health care provider. Make sure you discuss any questions you have with your health care provider. Document Released: 08/24/2004 Document Revised: 08/22/2016 Document Reviewed: 08/22/2016 Elsevier Interactive Patient Education  [employer].

## 2017-07-19 NOTE — Progress Notes (Signed)
I agree with the above plan 

## 2017-08-09 ENCOUNTER — Other Ambulatory Visit: Payer: Self-pay | Admitting: Family Medicine

## 2017-08-09 ENCOUNTER — Telehealth: Payer: Self-pay | Admitting: Family Medicine

## 2017-08-09 NOTE — Telephone Encounter (Signed)
Allergy alert/Atorvastatin, okay to send?

## 2017-08-09 NOTE — Telephone Encounter (Signed)
Copied from [place] [id number]. Topic: Quick Communication - See Telephone Encounter >> Aug 09, 2017 11:37 AM Ahmed Prima L wrote: CRM for notification. See Telephone encounter for:   08/09/17.  simvastatin (ZOCOR) 20 MG tablet  CPE 09/21/17   [id number] - [place], [place] - [address] AT [place]

## 2017-08-10 MED ORDER — LOSARTAN POTASSIUM 100 MG PO TABS: 100.0000 mg | ORAL_TABLET | Freq: Every day | ORAL | 0 refills | Status: DC

## 2017-08-10 NOTE — Telephone Encounter (Signed)
Last OV 10/18/2016. Rx was last refilled 11/28/2016 disp 90 with 3 refills. Rx sent.

## 2017-08-12 ENCOUNTER — Other Ambulatory Visit: Payer: Self-pay | Admitting: Family Medicine

## 2017-09-24 ENCOUNTER — Encounter: Payer: Medicare Other | Admitting: Family Medicine

## 2017-09-26 ENCOUNTER — Encounter: Payer: Self-pay | Admitting: Family Medicine

## 2017-09-26 ENCOUNTER — Ambulatory Visit (INDEPENDENT_AMBULATORY_CARE_PROVIDER_SITE_OTHER): Payer: Medicare Other | Admitting: Family Medicine

## 2017-09-26 VITALS — BP 138/80 | HR 83 | Temp 97.6°F | Ht 61.0 in | Wt 125.6 lb

## 2017-09-26 DIAGNOSIS — F418 Other specified anxiety disorders: Secondary | ICD-10-CM

## 2017-09-26 DIAGNOSIS — I10 Essential (primary) hypertension: Secondary | ICD-10-CM

## 2017-09-26 DIAGNOSIS — I4891 Unspecified atrial fibrillation: Secondary | ICD-10-CM | POA: Diagnosis not present

## 2017-09-26 DIAGNOSIS — R600 Localized edema: Secondary | ICD-10-CM

## 2017-09-26 DIAGNOSIS — G609 Hereditary and idiopathic neuropathy, unspecified: Secondary | ICD-10-CM | POA: Diagnosis not present

## 2017-09-26 DIAGNOSIS — I499 Cardiac arrhythmia, unspecified: Secondary | ICD-10-CM | POA: Diagnosis not present

## 2017-09-26 DIAGNOSIS — M353 Polymyalgia rheumatica: Secondary | ICD-10-CM | POA: Diagnosis not present

## 2017-09-26 LAB — BASIC METABOLIC PANEL
BUN: 18 mg/dL (ref 6–23)
CO2: 29 mEq/L (ref 19–32)
Calcium: 9.5 mg/dL (ref 8.4–10.5)
Chloride: 105 mEq/L (ref 96–112)
Creatinine, Ser: 0.91 mg/dL (ref 0.40–1.20)
GFR: 61.3 mL/min (ref >60.00)
Glucose, Bld: 106 mg/dL — ABNORMAL HIGH (ref 70–99)
Potassium: 3.8 mEq/L (ref 3.5–5.1)
Sodium: 143 mEq/L (ref 135–145)

## 2017-09-26 LAB — POC URINALSYSI DIPSTICK (AUTOMATED)
Bilirubin, UA: NEGATIVE
Glucose, UA: NEGATIVE
Ketones, UA: NEGATIVE
Leukocytes, UA: NEGATIVE
Nitrite, UA: NEGATIVE
Protein, UA: NEGATIVE
Spec Grav, UA: 1.025 (ref 1.010–1.025)
Urobilinogen, UA: 0.2 E.U./dL
pH, UA: 5.5 (ref 5.0–8.0)

## 2017-09-26 LAB — CBC WITH DIFFERENTIAL/PLATELET
Basophils Absolute: 0.1 10*3/uL (ref 0.0–0.1)
Basophils Relative: 1.2 % (ref 0.0–3.0)
Eosinophils Absolute: 0.3 10*3/uL (ref 0.0–0.7)
Eosinophils Relative: 4.5 % (ref 0.0–5.0)
HCT: 37.6 % (ref 36.0–46.0)
Hemoglobin: 12.9 g/dL (ref 12.0–15.0)
Lymphocytes Relative: 19.7 % (ref 12.0–46.0)
Lymphs Abs: 1.4 10*3/uL (ref 0.7–4.0)
MCHC: 34.3 g/dL (ref 30.0–36.0)
MCV: 92.1 fl (ref 78.0–100.0)
Monocytes Absolute: 0.7 10*3/uL (ref 0.1–1.0)
Monocytes Relative: 9.5 % (ref 3.0–12.0)
Neutro Abs: 4.5 10*3/uL (ref 1.4–7.7)
Neutrophils Relative %: 65.1 % (ref 43.0–77.0)
Platelets: 218 10*3/uL (ref 150.0–400.0)
RBC: 4.08 Mil/uL (ref 3.87–5.11)
RDW: 13 % (ref 11.5–15.5)
WBC: 6.9 10*3/uL (ref 4.0–10.5)

## 2017-09-26 LAB — HEPATIC FUNCTION PANEL
ALT: 10 U/L (ref 0–35)
AST: 13 U/L (ref 0–37)
Albumin: 3.9 g/dL (ref 3.5–5.2)
Alkaline Phosphatase: 85 U/L (ref 39–117)
Bilirubin, Direct: 0.2 mg/dL (ref 0.0–0.3)
Total Bilirubin: 1 mg/dL (ref 0.2–1.2)
Total Protein: 6.9 g/dL (ref 6.0–8.3)

## 2017-09-26 LAB — LIPID PANEL
Cholesterol: 160 mg/dL (ref 0–200)
HDL: 81.1 mg/dL (ref >39.00)
LDL Cholesterol: 62 mg/dL (ref 0–99)
NonHDL: 79.07
Total CHOL/HDL Ratio: 2
Triglycerides: 85 mg/dL (ref 0.0–149.0)
VLDL: 17 mg/dL (ref 0.0–40.0)

## 2017-09-26 LAB — TSH: TSH: 1.72 u[IU]/mL (ref 0.35–4.50)

## 2017-09-26 MED ORDER — LOSARTAN POTASSIUM 100 MG PO TABS: 100.0000 mg | ORAL_TABLET | Freq: Every day | ORAL | 3 refills | Status: DC

## 2017-09-26 MED ORDER — SERTRALINE HCL 50 MG PO TABS: 50.0000 mg | ORAL_TABLET | Freq: Every day | ORAL | 3 refills | Status: DC

## 2017-09-26 MED ORDER — CLOPIDOGREL BISULFATE 75 MG PO TABS: 75.0000 mg | ORAL_TABLET | Freq: Every day | ORAL | 3 refills | Status: DC

## 2017-09-26 MED ORDER — PANTOPRAZOLE SODIUM 40 MG PO TBEC: 40.0000 mg | DELAYED_RELEASE_TABLET | Freq: Every day | ORAL | 3 refills | Status: DC

## 2017-09-26 NOTE — Progress Notes (Signed)
Subjective:    Patient ID: Christine Middleton, female    DOB: 08-Nov-1922, 84 y.o.   MRN: [id number]  HPI Here with her daughter to follow up on issues. She feels fairly well in general and she denies any chest pains or SOB. She does struggle with some depression however, and this time of year is around the time her husband passed away last year. She feels sadness and gets tearful at times. She has trouble sleeping. Her appetite is good. They tried some melatonin OTC for sleep but this does not help. The last time she saw her Neurologist he said that he would no longer need to see her and that she could stop taking Depakote. She has since weaned off this.    Review of Systems  Constitutional: Positive for fatigue.  HENT: Negative.   Eyes: Negative.   Respiratory: Negative.   Cardiovascular: Positive for leg swelling. Negative for chest pain and palpitations.  Gastrointestinal: Negative.   Genitourinary: Negative for decreased urine volume, difficulty urinating, dyspareunia, dysuria, enuresis, flank pain, frequency, hematuria, pelvic pain and urgency.  Musculoskeletal: Negative.   Skin: Negative.   Neurological: Negative.   Psychiatric/Behavioral: Negative.        Objective:   Physical Exam  Constitutional: She appears well-developed and well-nourished. No distress.  HENT:  Head: Normocephalic and atraumatic.  Right Ear: External ear normal.  Left Ear: External ear normal.  Nose: Nose normal.  Mouth/Throat: Oropharynx is clear and moist. No oropharyngeal exudate.  Eyes: Conjunctivae and EOM are normal. Pupils are equal, round, and reactive to light. Right eye exhibits no discharge. Left eye exhibits no discharge. No scleral icterus.  Neck: Normal range of motion. Neck supple. No JVD present. No thyromegaly present.  Cardiovascular: Normal rate, normal heart sounds and intact distal pulses. Exam reveals no gallop and no friction rub.  No murmur heard. Irregular rhythm. EKG shows atrial  fibrillation  Pulmonary/Chest: Effort normal and breath sounds normal. No stridor. No respiratory distress. She has no wheezes. She has no rales. She exhibits no tenderness.  Abdominal: Soft. Normal appearance and bowel sounds are normal. She exhibits no distension, no abdominal bruit, no ascites and no mass. There is no hepatosplenomegaly. There is no tenderness. There is no rigidity, no rebound and no guarding. No hernia.  Genitourinary: Rectum normal, vagina normal and uterus normal. No breast swelling, tenderness, discharge or bleeding. Cervix exhibits no motion tenderness, no discharge and no friability. Right adnexum displays no mass, no tenderness and no fullness. Left adnexum displays no mass, no tenderness and no fullness. No erythema, tenderness or bleeding in the vagina. No vaginal discharge found.  Musculoskeletal: Normal range of motion. She exhibits no edema or tenderness.  Lymphadenopathy:    She has no cervical adenopathy.  Neurological: She is alert. She has normal reflexes. No cranial nerve deficit. She exhibits normal muscle tone. Coordination normal.  Skin: Skin is warm and dry. No rash noted. She is not diaphoretic. No erythema. No pallor.  Psychiatric: She has a normal mood and affect. Her behavior is normal. Judgment and thought content normal.          Assessment & Plan:  Her HTN is stable. She has a new dx of atrial fibrillation. Her ventricular rate is well controlled and she seems to tolerate this very well. She is on Plavix but another type of blood thinner may be more appropriate for her. We will refer her to Cardiology to evaluate further. Get fasting labs today. She has  some mild depression so she will try Zoloft 50 mg qhs. [name], MD

## 2017-09-27 ENCOUNTER — Telehealth: Payer: Self-pay | Admitting: Family Medicine

## 2017-09-27 NOTE — Telephone Encounter (Signed)
Copied from [place]. Topic: General - Other >> Sep 27, 2017  9:15 AM Darl Householder, RMA wrote: Reason for CRM: patient is requesting for cardiology referral to be changed to a stat due to she can't get an appt until 11/14/17 and she would like to be seen sooner, please have DR. Sarajane Jews CMA return call to patient's daughter Lawana Chambers at [phone]

## 2017-09-27 NOTE — Telephone Encounter (Signed)
Since she wants to see Dr. Martinique, I doubt she can get in much sooner than that. I suggest she see the first available provider in that group to get the atrial fibrillation stabilized and then switch to Dr. Martinique later on

## 2017-10-17 DIAGNOSIS — I639 Cerebral infarction, unspecified: Secondary | ICD-10-CM | POA: Diagnosis not present

## 2017-10-17 DIAGNOSIS — Z961 Presence of intraocular lens: Secondary | ICD-10-CM | POA: Diagnosis not present

## 2017-10-17 DIAGNOSIS — H353131 Nonexudative age-related macular degeneration, bilateral, early dry stage: Secondary | ICD-10-CM | POA: Diagnosis not present

## 2017-10-17 DIAGNOSIS — H53461 Homonymous bilateral field defects, right side: Secondary | ICD-10-CM | POA: Diagnosis not present

## 2017-10-25 ENCOUNTER — Other Ambulatory Visit: Payer: Self-pay | Admitting: Family Medicine

## 2017-10-26 ENCOUNTER — Other Ambulatory Visit: Payer: Self-pay

## 2017-10-26 MED ORDER — SERTRALINE HCL 50 MG PO TABS: 50.0000 mg | ORAL_TABLET | Freq: Every day | ORAL | 0 refills | Status: DC

## 2017-10-26 NOTE — Telephone Encounter (Signed)
Fax from [facility] requesting 90 day supply for sertraline HCL 50 MG   Rx sent

## 2017-11-08 ENCOUNTER — Encounter: Payer: Self-pay | Admitting: Cardiology

## 2017-11-12 NOTE — Progress Notes (Signed)
Cardiology Office Note   Date:  11/12/2017   ID:  Samuel Jester Couse, DOB Jul 12, 1923, MRN [id number]  PCP:  Laurey Morale, MD  Cardiologist:   Peter Martinique, MD   No chief complaint on file.     History of Present Illness: Christine Middleton is a 84 y.o. female who is seen at the request of Dr. Sarajane Jews for evaluation of Atrial fibrillation. She has a history of HTN, HLD, and CVA. She was admitted in January 2018 with CVA with left facial droop. 2-D echo 08/18/2016 - EF 60-65%, no regional wall motion abnormality. Grade 2 diastolic dysfunction. No cardiac source of emboli. Carotid Doppler 08/18/2016 - negative. A1c-5.8. 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 Patient declinedTEE/Loop Recorder. She was maintained on Plavix.   Recently she was seen by Dr Sarajane Jews for follow up. Noted to be in Afib with controlled rate. On follow up today she is seen with her daughter. She is feeling well. Denies any chest pain, SOB, palpitations, dizziness. Is really unaware of her arrhythmia. She lives with her son and still does her own housework and some yard work. Doesn't want to have another stroke.    Past Medical History:  Diagnosis Date  . Diverticulitis   . Esophageal stricture   . GERD (gastroesophageal reflux disease)   . Hyperlipidemia   . Hypertension   . Shingles   . Stroke ([place])   . Vitamin B 12 deficiency     Past Surgical History:  Procedure Laterality Date  . ABDOMINAL HYSTERECTOMY    . APPENDECTOMY    . COLONOSCOPY  12-09-08   per Dr. Sharlett Iles (incomplete along with barium enema 12-10-08) with hemorrhoids  and diverticulae  . ESOPHAGOGASTRODUODENOSCOPY  09-2004   per Dr. Sharlett Iles with dilatation   . EYE SURGERY    . TONSILLECTOMY       Current Outpatient Medications  Medication Sig Dispense Refill  . amLODipine (NORVASC) 5 MG  tablet TAKE 1 TABLET (5 MG TOTAL) BY MOUTH DAILY. 90 tablet 2  . atenolol (TENORMIN) 50 MG tablet TAKE 2 TABLETS BY MOUTH EVERY DAY 180 tablet 3  . clopidogrel (PLAVIX) 75 MG tablet Take 1 tablet (75 mg total) by mouth daily with breakfast. 90 tablet 3  . losartan (COZAAR) 100 MG tablet Take 1 tablet (100 mg total) by mouth daily. 90 tablet 3  . Melatonin 1 MG TABS Take 1 tablet by mouth at bedtime as needed. [employer] [facility] Melatonin    . pantoprazole (PROTONIX) 40 MG tablet Take 1 tablet (40 mg total) by mouth daily. 90 tablet 3  . sertraline (ZOLOFT) 50 MG tablet Take 1 tablet (50 mg total) by mouth at bedtime. 90 tablet 0  . simvastatin (ZOCOR) 20 MG tablet TAKE 1 TABLET (20 MG TOTAL) BY MOUTH AT BEDTIME. 90 tablet 3   No current facility-administered medications for this visit.     Allergies:   Amoxicillin; Codeine; Lipitor [atorvastatin]; Lisinopril; and Nitrofurantoin    Social History:  The patient  reports that she has never smoked. She has never used smokeless tobacco. She reports that she does not drink alcohol or use drugs.   Family History:  The patient's family history includes Breast cancer in her unknown relative; Cancer in her brother and brother; Diabetes in her brother and unknown relative; Heart attack in her mother; Heart disease in her father  and unknown relative; Hypertension in her unknown relative; Stroke in her sister and unknown relative.    ROS:  Please see the history of present illness.   Otherwise, review of systems are positive for none.   All other systems are reviewed and negative.    PHYSICAL EXAM: VS:  There were no vitals taken for this visit. , BMI There is no height or weight on file to calculate BMI. GEN: Well nourished, well developed, WF in no acute distress  HEENT: normal  Neck: no JVD, carotid bruits, or masses Cardiac: IRRR; no murmurs, rubs, or gallops,no edema  Respiratory:  clear to auscultation bilaterally, normal work of  breathing GI: soft, nontender, nondistended, + BS MS: no deformity or atrophy  Skin: warm and dry, no rash Neuro:  Strength and sensation are intact Psych: euthymic mood, full affect   EKG:  EKG is ordered today. The ekg ordered today demonstrates Afib rate 91. Possible old septal infarct. LAD. I have personally reviewed and interpreted this study.    Recent Labs: 09/26/2017: ALT 10; BUN 18; Creatinine, Ser 0.91; Hemoglobin 12.9; Platelets 218.0; Potassium 3.8; Sodium 143; TSH 1.72    Lipid Panel    Component Value Date/Time   CHOL 160 09/26/2017 1119   TRIG 85.0 09/26/2017 1119   TRIG 92 05/28/2006 1104   HDL 81.10 09/26/2017 1119   CHOLHDL 2 09/26/2017 1119   VLDL 17.0 09/26/2017 1119   LDLCALC 62 09/26/2017 1119   LDLDIRECT 113.3 07/18/2012 1113      Wt Readings from Last 3 Encounters:  09/26/17 125 lb 9.6 oz (57 kg)  07/18/17 129 lb 9.6 oz (58.8 kg)  01/17/17 131 lb 9.6 oz (59.7 kg)      Other studies Reviewed: Additional studies/ records that were reviewed today include:   Echo 08/18/16: Study Conclusions  - Left ventricle: The cavity size was normal. Wall thickness was   normal. Systolic function was normal. The estimated ejection   fraction was in the range of 60% to 65%. Wall motion was normal;   there were no regional wall motion abnormalities. Features are   consistent with a pseudonormal left ventricular filling pattern,   with concomitant abnormal relaxation and increased filling   pressure (grade 2 diastolic dysfunction). - Aortic valve: Mildly calcified annulus. Trileaflet; mildly   thickened, mildly calcified leaflets. There was mild   regurgitation. - Mitral valve: There was moderate regurgitation.  Impressions:  - No cardiac source of emboli was indentified.   ASSESSMENT AND PLAN:  1.  Atrial fibrillation now persistent. Rate is well controlled on atenolol and she is asymptomatic. I suspect Afib is the cause of her embolic stroke in  January. I would recommend anticoagulation to reduce risk of further strokes. Will start Eliquis at 2.5 mg bid based on age and reduced body weight. Stop Plavix. Avoid ASA or NSAIDs. Echo unremarkable. Labs OK. Will follow up in 3 months.   2. S/p embolic CVA.   3. HTN controlled.    Current medicines are reviewed at length with the patient today.  The patient does not have concerns regarding medicines.  The following changes have been made:  See above  Labs/ tests ordered today include:  No orders of the defined types were placed in this encounter.    Disposition:   FU with me in 3 months  Signed, Peter Martinique, MD  11/12/2017 3:20 PM    [facility] [address] [place], [place], [address] Phone [phone], Fax [phone]

## 2017-11-14 ENCOUNTER — Encounter: Payer: Self-pay | Admitting: Cardiology

## 2017-11-14 ENCOUNTER — Ambulatory Visit: Payer: Medicare Other | Admitting: Cardiology

## 2017-11-14 VITALS — BP 128/70 | HR 91 | Ht 61.0 in | Wt 129.4 lb

## 2017-11-14 DIAGNOSIS — I481 Persistent atrial fibrillation: Secondary | ICD-10-CM

## 2017-11-14 DIAGNOSIS — I63413 Cerebral infarction due to embolism of bilateral middle cerebral arteries: Secondary | ICD-10-CM | POA: Diagnosis not present

## 2017-11-14 DIAGNOSIS — I4819 Other persistent atrial fibrillation: Secondary | ICD-10-CM

## 2017-11-14 MED ORDER — APIXABAN 2.5 MG PO TABS: 2.5000 mg | ORAL_TABLET | Freq: Two times a day (BID) | ORAL | 11 refills | Status: DC

## 2017-11-14 NOTE — Addendum Note (Signed)
Addended by: Kathyrn Lass on: 11/14/2017 02:20 PM   Modules accepted: Orders

## 2017-11-14 NOTE — Patient Instructions (Signed)
Stop taking Plavix  Start Eliquis 2.5 mg twice a day  Avoid taking NSAIDs or ASA. It is safe to take Tylenol  I will see you in 3 months.

## 2017-11-27 ENCOUNTER — Encounter

## 2018-01-26 IMAGING — MR MR HEAD W/O CM
9 of 11 series · 29 of 48 positions shown · non-contrast
Comparison: Head CT 08/18/2016.  Head MRI and MRA 10/05/2013.

CLINICAL DATA: Dysarthria and facial droop.  Prior stroke.

EXAM:
MRI HEAD WITHOUT CONTRAST
MRA HEAD WITHOUT CONTRAST
TECHNIQUE: Multiplanar, multiecho pulse sequences of the brain and surrounding
structures were obtained without intravenous contrast. Angiographic
images of the head were obtained using MRA technique without
contrast.

[Series 3: DWI · axial · 3.0mm · 0.94mm/px · z∈[-66,+80]mm · 6 of 100 slices shown (1 of 2)]
[im 1/100]
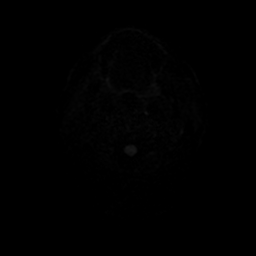
[im 20/100]
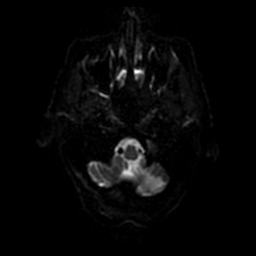
[im 40/100]
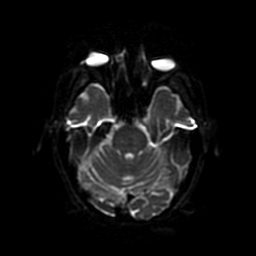
[im 60/100]
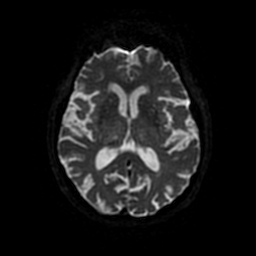
[im 80/100]
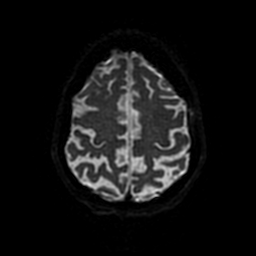
[im 100/100]
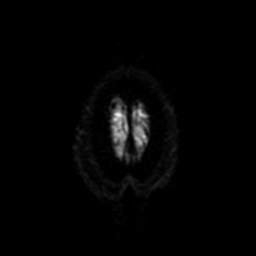

[Series 4: DWI · coronal · 4.0mm · 0.94mm/px · 5 of 66 slices shown (2 of 2)]
[im 1/66]
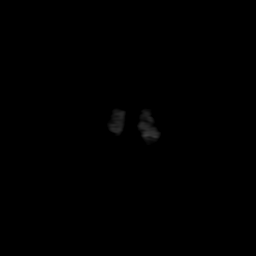
[im 17/66]
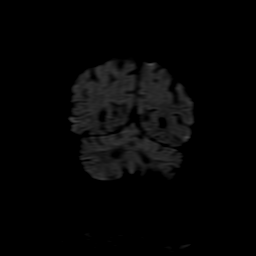
[im 33/66]
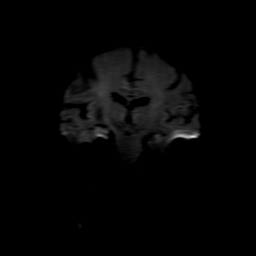
[im 49/66]
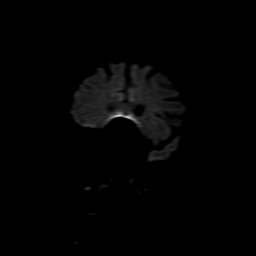
[im 66/66]
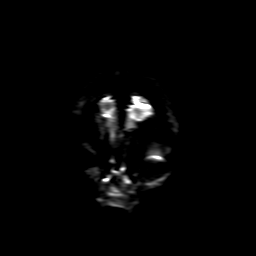

[Series 5: ax (id) 2 · axial · 1.0mm · 0.43mm/px · z∈[-50,-12]mm · 4 of 184 slices shown]
[im 1/184]
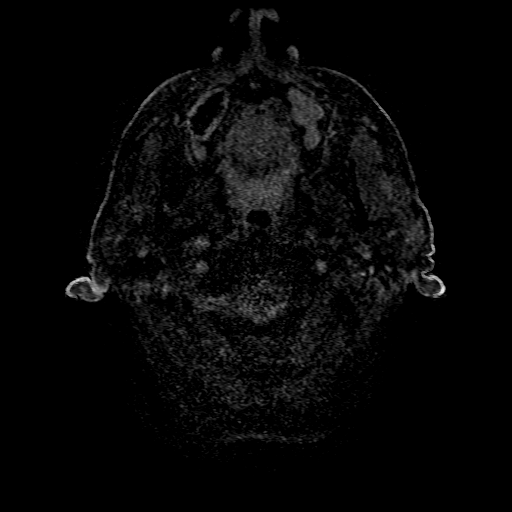
[im 31/184]
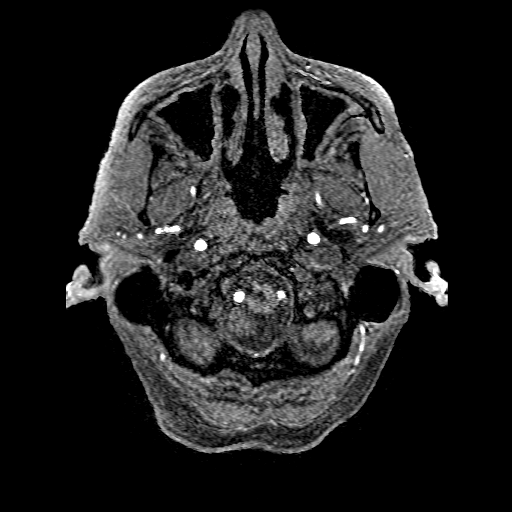
[im 62/184]
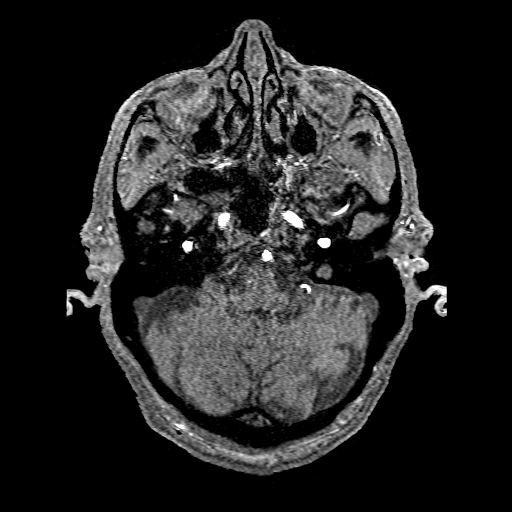
[im 77/184]
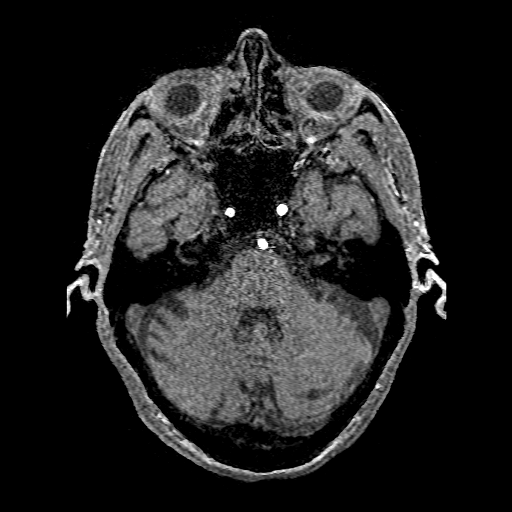

[Series 6: FLAIR · sagittal · 5.0mm · 0.47mm/px · 2 of 23 slices shown (1 of 2)]
[im 1/23]
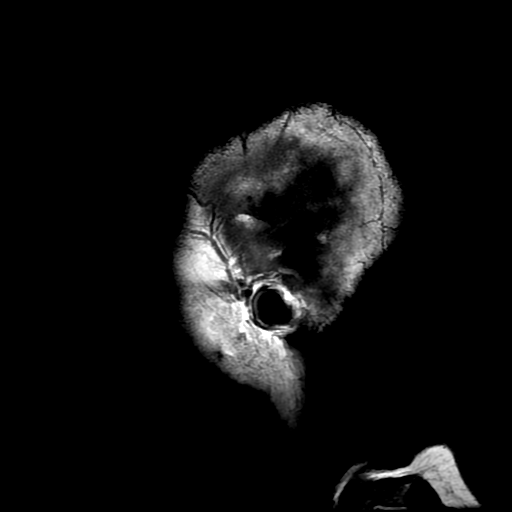
[im 23/23]
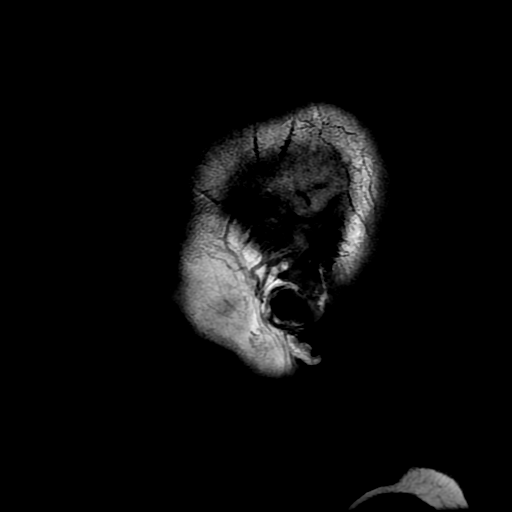

[Series 8: T2 · axial · 5.0mm · 0.47mm/px · z∈[-65,+79]mm · 2 of 25 slices shown]
[im 1/25]
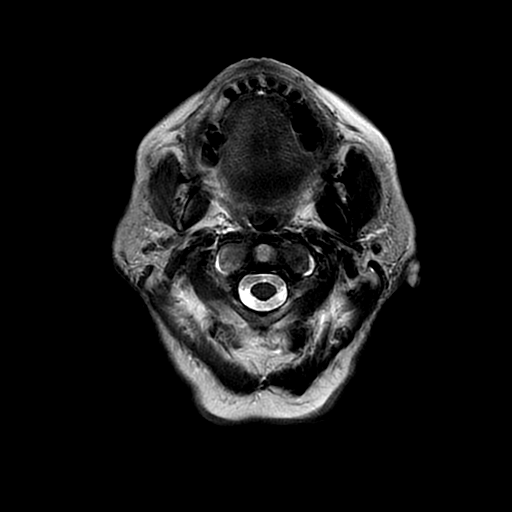
[im 25/25]
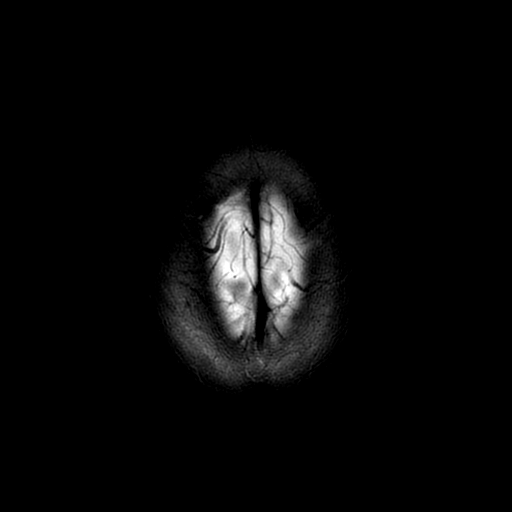

[Series 9: FLAIR · axial · 5.0mm · 0.47mm/px · z∈[-65,+79]mm · 2 of 25 slices shown (2 of 2)]
[im 1/25]
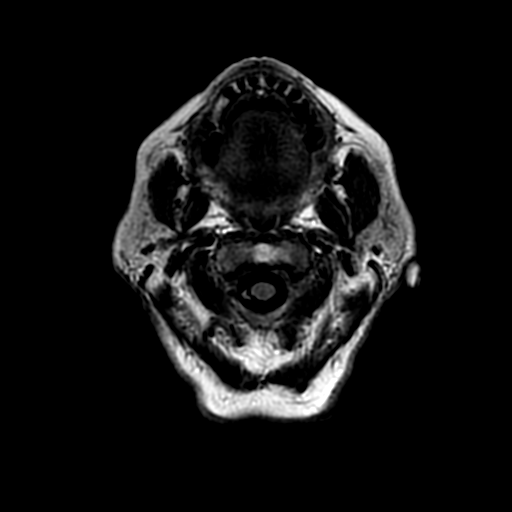
[im 25/25]
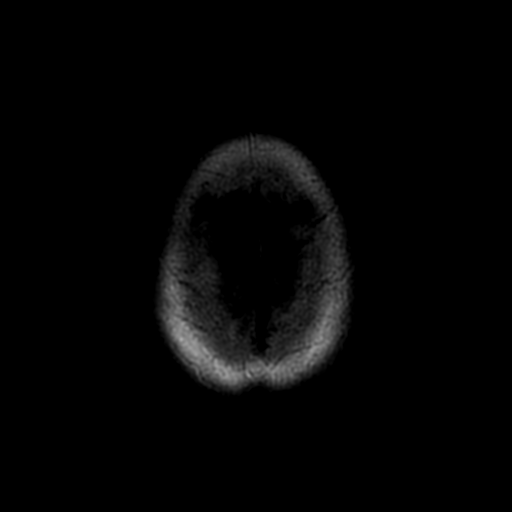

[Series 12: T2 post-contrast · coronal · 5.0mm · 0.39mm/px · 2 of 28 slices shown]
[im 1/28]
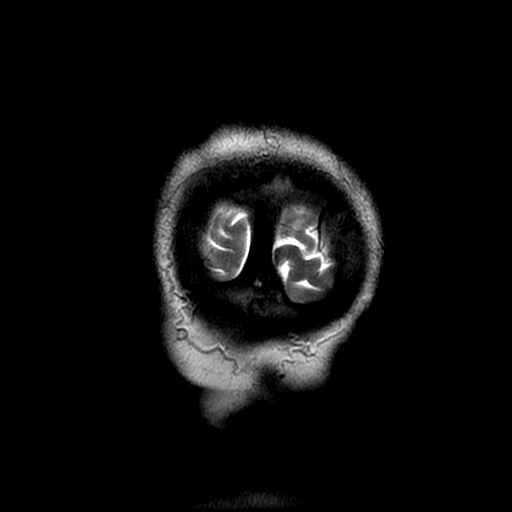
[im 28/28]
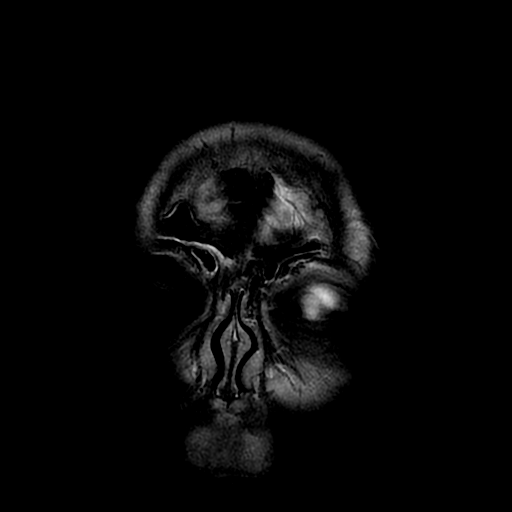

[Series 350: ADC · axial · 3.0mm · 0.94mm/px · z∈[-66,+80]mm · 4 of 50 slices shown (1 of 2)]
[im 1/50]
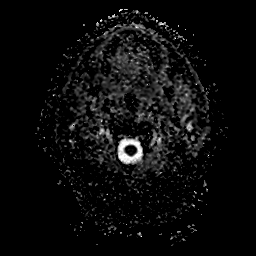
[im 17/50]
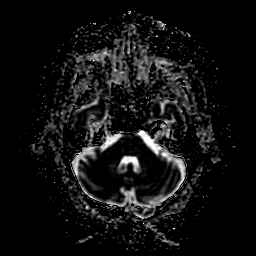
[im 33/50]
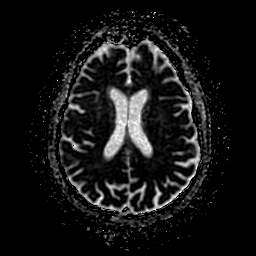
[im 50/50]
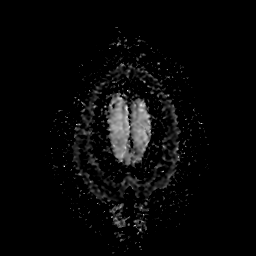

[Series 450: ADC · coronal · 4.0mm · 0.94mm/px · 2 of 33 slices shown (2 of 2)]
[im 1/33]
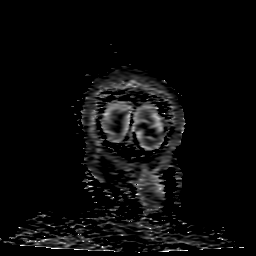
[im 33/33]
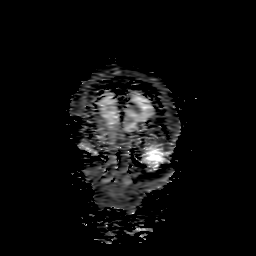

[29 of 48 positions shown; findings below may reference images not displayed]

FINDINGS: MRI HEAD FINDINGS

Brain: There is a small region of patchy/hazy increased trace
diffusion signal with slightly reduced ADC in the right corona
radiata and centrum semiovale. A few discrete subcentimeter foci of
restricted diffusion are present in the subcortical white matter of
the left superior frontal gyrus near the vertex as well as involving
the lateral right occipital lobe cortex and may reflect left ACA/MCA
and right MCA/ PCA watershed infarcts, respectively. There is also a
small region of restricted diffusion in the mesial left temporal
lobe at the level of the hippocampal head.

A chronic right temporal-occipital infarct is unchanged from the
prior MRI. A chronic left cerebellar infarct in the PICA territory
is new from 0082. Small foci of T2 hyperintensity scattered
throughout the cerebral white matter bilaterally and in the
brainstem are similar to the prior MRI and nonspecific but
compatible with chronic small vessel ischemia, mild for age. There
is mild global cerebral atrophy. No intracranial hemorrhage, midline
shift, or extra-axial fluid collection is seen. Chronic small vessel
ischemic changes are present in the right greater than left thalami.
A small extra-axial mass over the left frontoparietal convexity
measures 14 x 7 mm, not significantly changed from the prior MRI and
without significant associated mass effect or evidence of associated
edema.

Vascular: New small focus of susceptibility artifact associated with
an MCA branch vessel in the right sylvian fissure with abnormal
FLAIR hyperintensity in more distal MCA branch vessels, more fully
evaluated below. Similar findings involving the left PCA.

Skull and upper cervical spine: Unremarkable bone marrow signal.

Sinuses/Orbits: Prior bilateral cataract extraction. Mild mucosal
thickening in the paranasal sinuses. Small volume right sphenoid
sinus fluid. Clear mastoid air cells.

Other: None.

MRA HEAD FINDINGS

The visualized distal vertebral arteries are widely patent to the
basilar with the right being dominant. Right PICA and left AICA are
again not clearly identified. Left PICA and right AICA origins are
patent. SCA origins are patent. Basilar artery is widely patent.
Posterior communicating arteries are not identified. Right PCA is
patent without significant proximal stenosis. There is occlusion of
the left PCA at the level of the P1-P2 junction, new from 0082.

The internal carotid arteries are widely patent from skullbase to
carotid termini. The ACAs and lift MCA are patent without evidence
of significant proximal stenosis or major branch occlusion. The
right M1 segment and MCA bifurcation/ trifurcation are widely
patent, however the there is occlusion of a mid right M2 middle
division branch vessel in the sylvian fissure with decreased flow
related enhancement of more distal branches in this distribution. No
intracranial aneurysm is identified.
IMPRESSION: 1. Right mid M2 branch vessel embolic occlusion with small region of
acute infarction in the corona radiata/centrum semiovale.
2. Small acute embolic or watershed infarcts in the high left
frontal lobe and lateral right occipital lobe.
3. Restricted diffusion in the left hippocampus. This is also likely
an acute infarct given the other coexistent findings, however this
appearance can also be a transient finding without chronic sequelae.
4. New proximal left PCA occlusion, also likely embolic though
without an associated acute or chronic large infarct in this
territory.
5. Unchanged small left lateral convexity meningioma.
6. Chronic right temporal-occipital and left cerebellar infarcts.

## 2018-01-26 IMAGING — CT CT HEAD CODE STROKE
3 of 4 series · 17 of 47 positions shown, 20 images · non-contrast
Comparison: MRI brain 10/05/2013. CT head without contrast
10/04/2013.

CLINICAL DATA: Code stroke. Code stroke. Left upper extremity
weakness. Left-sided facial droop.

EXAM:
CT HEAD WITHOUT CONTRAST
TECHNIQUE: Contiguous axial images were obtained from the base of the skull
through the vertex without intravenous contrast.

[Series 201: head w/o, idose (1) · axial · non-contrast · 0.46mm/px · z∈[+57,+177]mm · 11 of 29 slices shown, 14 images]
[im 3/29  brain]
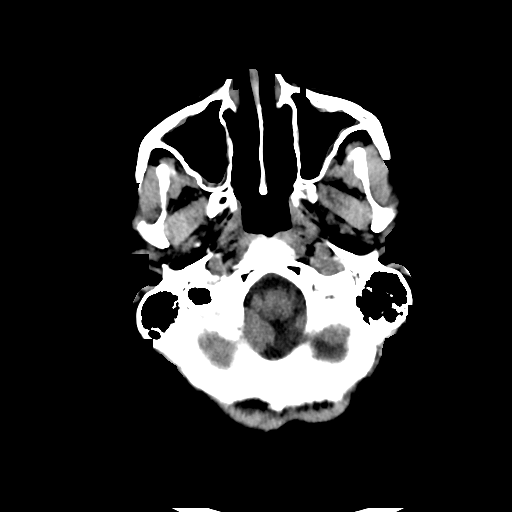
[im 3/29  bone]
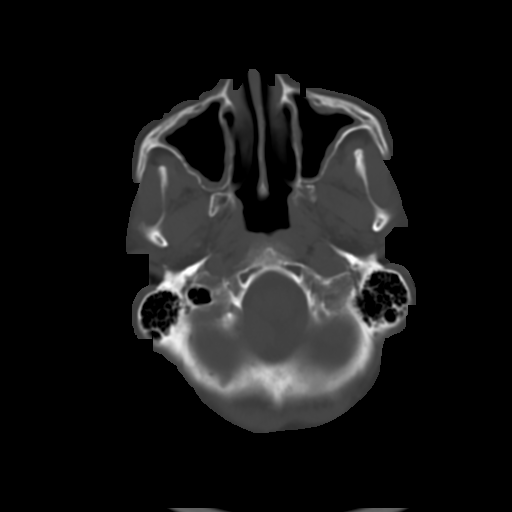
[im 5/29  brain]
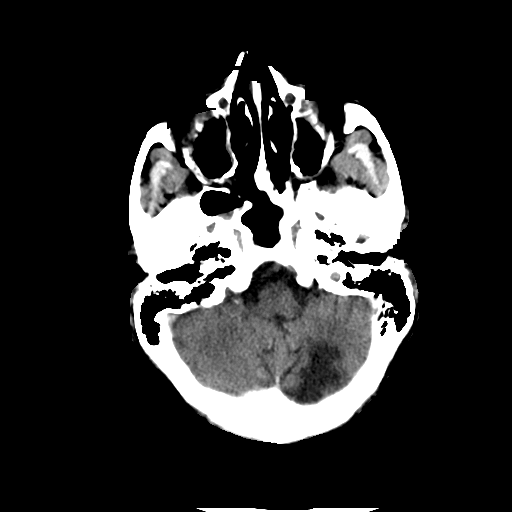
[im 7/29  brain]
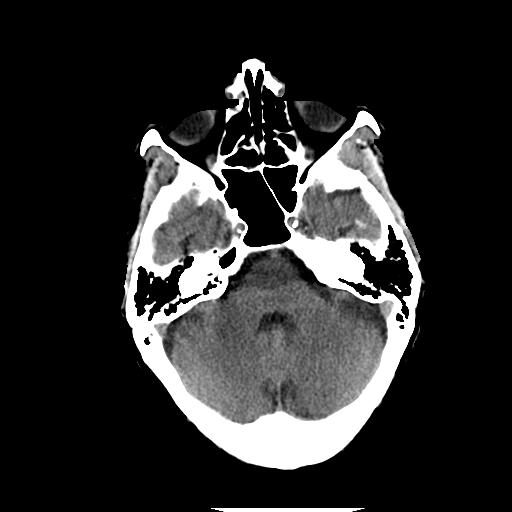
[im 11/29  brain]
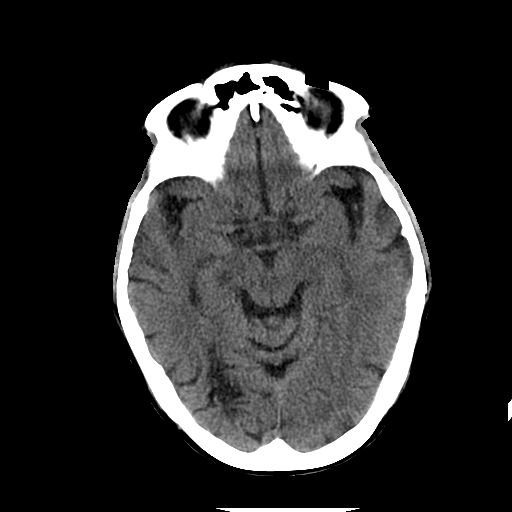
[im 13/29  brain]
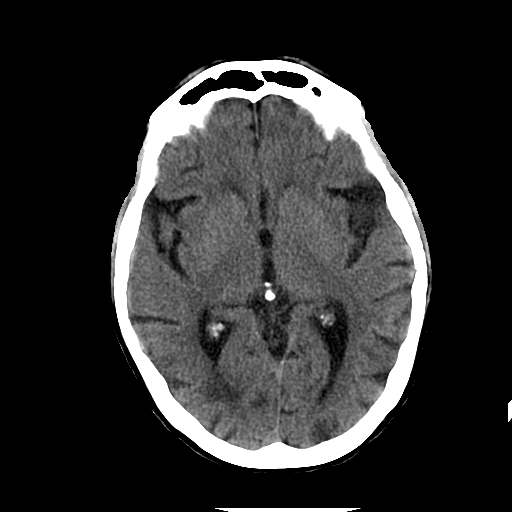
[im 13/29  bone]
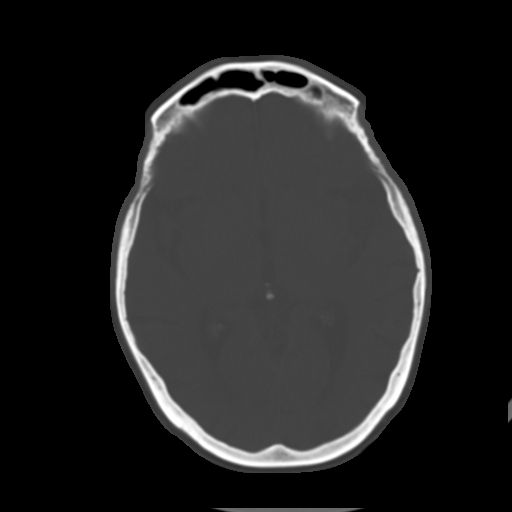
[im 15/29  brain]
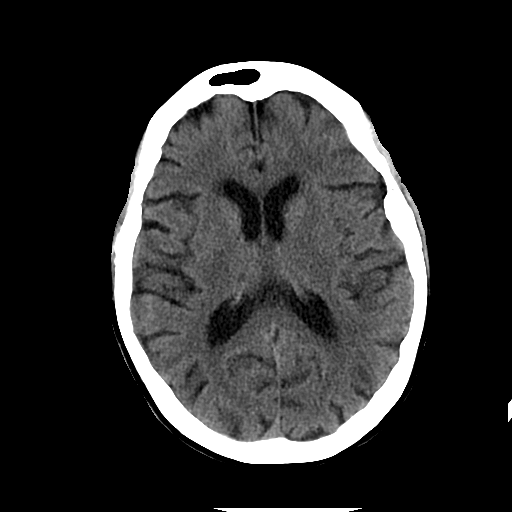
[im 17/29  brain]
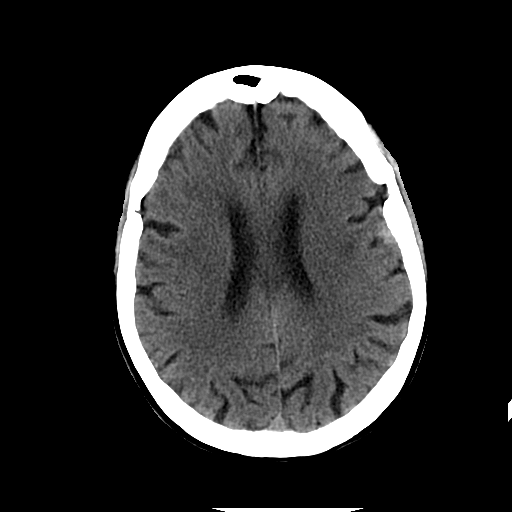
[im 19/29  brain]
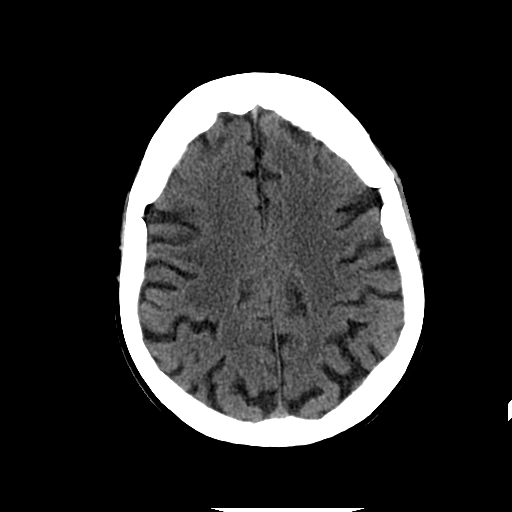
[im 23/29  brain]
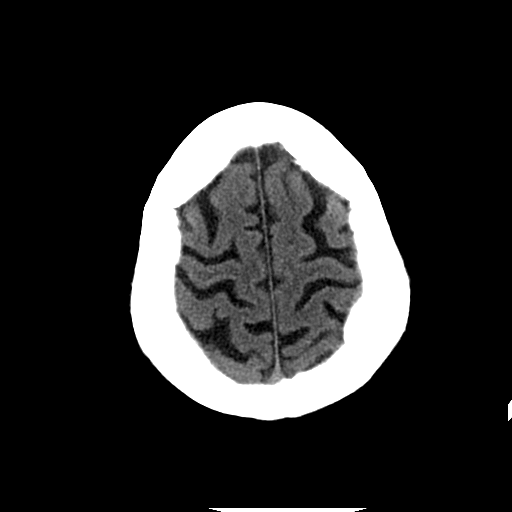
[im 23/29  bone]
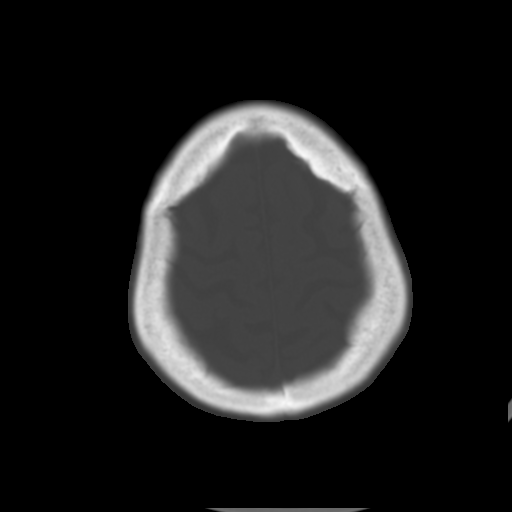
[im 25/29  brain]
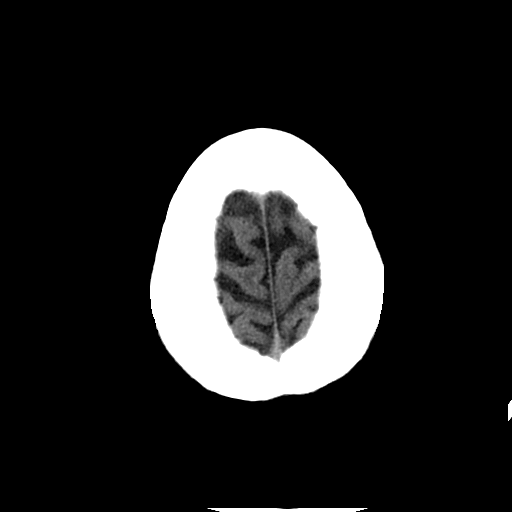
[im 27/29  brain]
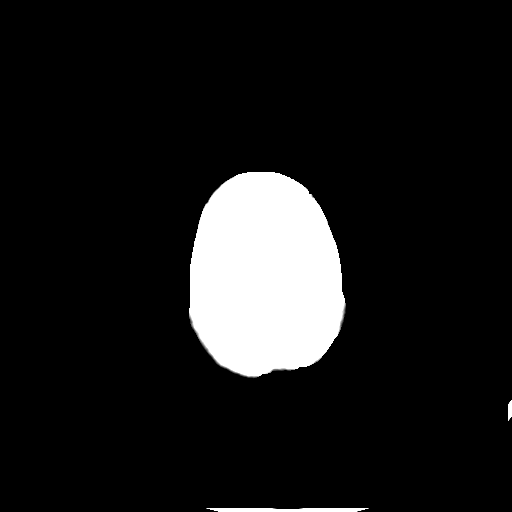

[Series 203: coronal st, idose (1) · coronal · 0.40mm/px · 3 of 72 slices shown]
[im 24/72  brain]
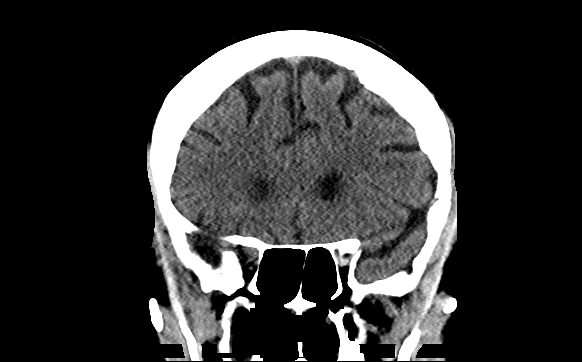
[im 32/72  brain]
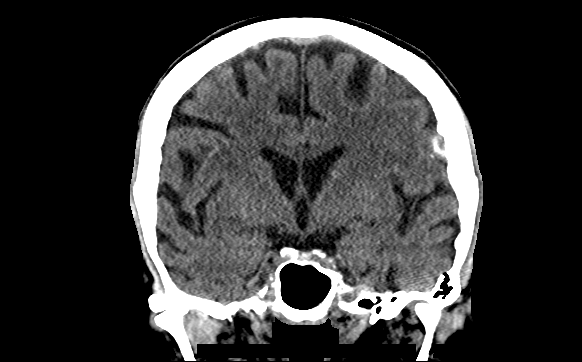
[im 40/72  brain]
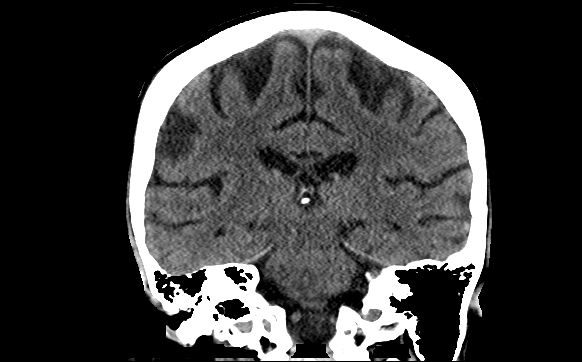

[Series 204: sagittal st, idose (1) · sagittal · 0.40mm/px · 3 of 74 slices shown]
[im 25/74  brain]
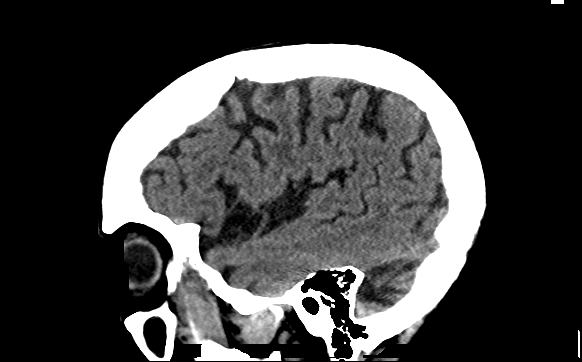
[im 37/74  brain]
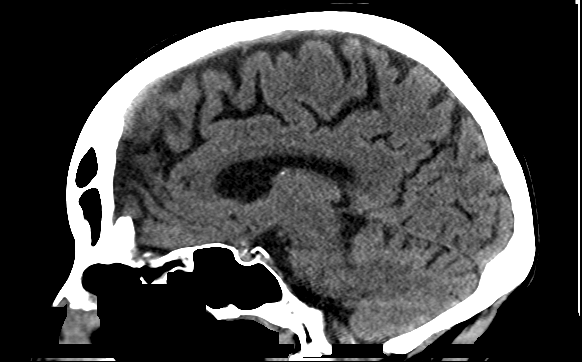
[im 49/74  brain]
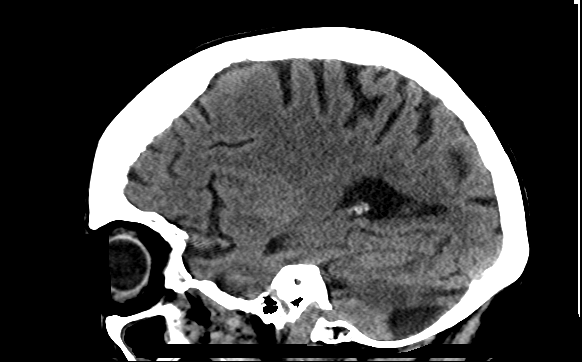

[17 of 47 positions shown; findings below may reference images not displayed]

FINDINGS: Brain: An inferior left cerebellar infarct is new since the prior
study, but not acute. A remote posterior right temporal and
occipital lobe infarct is stable. Right basal ganglia are intact. No
acute cortical infarct is present. The insular ribbon is within
normal limits.

The brainstem and cerebellum are otherwise unremarkable. A left
frontal meningioma is stable. Atrophy and white matter disease is
stable.

Vascular: Atherosclerotic calcifications are present within the
cavernous internal carotid arteries bilaterally. There is no
hyperdense vessel.

Skull: The calvarium is intact. No significant extracranial soft
tissue lesion is present.

Sinuses/Orbits: Circumferential mucosal thickening is present in the
maxillary sinuses bilaterally. There is mucosal thickening within
the anterior right ethmoid air cells and inferior right frontal
sinus. A fluid level is present within the right sphenoidal recess.
There is mild mucosal thickening posteriorly in the right sphenoid
sinus. The left sphenoid sinus is clear. The mastoid air cells are
clear.

ASPECTS ([place]berta Stroke Program Early CT Score)

- Ganglionic level infarction (caudate, lentiform nuclei, internal
capsule, insula, M1-M3 cortex): [DATE]

- Supraganglionic infarction (M4-M6 cortex): [DATE]

Total score (0-10 with 10 being normal): [DATE]
IMPRESSION: 1. No acute intracranial abnormality.
2. Interval infarct within the inferior left cerebellum does not
appear acute.
3. Stable remote posterior right temporal and occipital lobe
infarct.
4. Stable atrophy and white matter disease.
5. ASPECTS is [DATE]
These results were called by telephone at the time of interpretation
on 08/18/2016 at [DATE] to Dr. Samaroo, who verbally acknowledged
these results.

## 2018-01-28 ENCOUNTER — Encounter: Payer: Self-pay | Admitting: Cardiology

## 2018-02-05 NOTE — Progress Notes (Signed)
Cardiology Office Note   Date:  02/06/2018   ID:  Christine Middleton, DOB 23-Sep-1922, MRN [id number]  PCP:  Laurey Morale, MD  Cardiologist:   Gianelle Mccaul Martinique, MD   Chief Complaint  Patient presents with  . Atrial Fibrillation      History of Present Illness: Christine Middleton is a 84 y.o. female who is seen at the request of Dr. Sarajane Jews for evaluation of Atrial fibrillation. She has a history of HTN, HLD, and CVA. She was admitted in January 2018 with CVA with left facial droop. 2-D echo 08/18/2016 - EF 60-65%, no regional wall motion abnormality. Grade 2 diastolic dysfunction. No cardiac source of emboli. Carotid Doppler 08/18/2016 - negative. A1c-5.8. 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 Patient declinedTEE/Loop Recorder. She was maintained on Plavix.   On subsequent follow up with Dr. Sarajane Jews she was noted to be in Afib with controlled rate. She was anticoagulated with Eliquis.   On follow up today she is seen with her daughter. She is doing well. Denies any chest pain, SOB, palpitations, dizziness. Is really unaware of her arrhythmia. She does have some left pedal edema at night that goes down by the morning. Does not like support hose. Rarely feels a little "woozy"     Past Medical History:  Diagnosis Date  . Diverticulitis   . Esophageal stricture   . GERD (gastroesophageal reflux disease)   . Hyperlipidemia   . Hypertension   . Shingles   . Stroke ([place])   . Vitamin B 12 deficiency     Past Surgical History:  Procedure Laterality Date  . ABDOMINAL HYSTERECTOMY    . APPENDECTOMY    . COLONOSCOPY  12-09-08   per Dr. Sharlett Iles (incomplete along with barium enema 12-10-08) with hemorrhoids  and diverticulae  . ESOPHAGOGASTRODUODENOSCOPY  09-2004   per Dr. Sharlett Iles with dilatation   . EYE SURGERY    . TONSILLECTOMY        Current Outpatient Medications  Medication Sig Dispense Refill  . amLODipine (NORVASC) 5 MG tablet TAKE 1 TABLET (5 MG TOTAL) BY MOUTH DAILY. 90 tablet 2  . apixaban (ELIQUIS) 2.5 MG TABS tablet Take 1 tablet (2.5 mg total) by mouth 2 (two) times daily. 60 tablet 11  . atenolol (TENORMIN) 50 MG tablet TAKE 2 TABLETS BY MOUTH EVERY DAY 180 tablet 3  . losartan (COZAAR) 100 MG tablet Take 1 tablet (100 mg total) by mouth daily. 90 tablet 3  . pantoprazole (PROTONIX) 40 MG tablet Take 1 tablet (40 mg total) by mouth daily. 90 tablet 3  . simvastatin (ZOCOR) 20 MG tablet TAKE 1 TABLET (20 MG TOTAL) BY MOUTH AT BEDTIME. 90 tablet 3   No current facility-administered medications for this visit.     Allergies:   Amoxicillin; Codeine; Lipitor [atorvastatin]; Lisinopril; and Nitrofurantoin    Social History:  The patient  reports that she has never smoked. She has never used smokeless tobacco. She reports that she does not drink alcohol or use drugs.   Family History:  The patient's family history includes Breast cancer in her unknown relative; Cancer in her brother and brother; Diabetes in her brother and unknown relative; Heart attack in her mother; Heart disease in her father and unknown relative; Hypertension in her unknown relative; Stroke in her sister and unknown relative.    ROS:  Please  see the history of present illness.   Otherwise, review of systems are positive for none.   All other systems are reviewed and negative.    PHYSICAL EXAM: VS:  BP 120/70   Pulse 77   Ht 5\' 1"  (1.549 m)   Wt 131 lb 6.4 oz (59.6 kg)   BMI 24.83 kg/m  , BMI Body mass index is 24.83 kg/m. GENERAL:  Well appearing elderly WF in NAD HEENT:  PERRL, EOMI, sclera are clear. Oropharynx is clear. NECK:  No jugular venous distention, carotid upstroke brisk and symmetric, no bruits, no thyromegaly or adenopathy LUNGS:  Clear to auscultation bilaterally CHEST:  Unremarkable HEART:  RRR,  PMI not  displaced or sustained,S1 and S2 within normal limits, no S3, no S4: no clicks, no rubs, no murmurs ABD:  Soft, nontender. BS +, no masses or bruits. No hepatomegaly, no splenomegaly EXT:  2 + pulses throughout, no edema, no cyanosis no clubbing SKIN:  Warm and dry.  No rashes NEURO:  Alert and oriented x 3. Cranial nerves II through XII intact. PSYCH:  Cognitively intact     EKG:  EKG is not ordered today.   Recent Labs: 09/26/2017: ALT 10; BUN 18; Creatinine, Ser 0.91; Hemoglobin 12.9; Platelets 218.0; Potassium 3.8; Sodium 143; TSH 1.72    Lipid Panel    Component Value Date/Time   CHOL 160 09/26/2017 1119   TRIG 85.0 09/26/2017 1119   TRIG 92 05/28/2006 1104   HDL 81.10 09/26/2017 1119   CHOLHDL 2 09/26/2017 1119   VLDL 17.0 09/26/2017 1119   LDLCALC 62 09/26/2017 1119   LDLDIRECT 113.3 07/18/2012 1113      Wt Readings from Last 3 Encounters:  02/06/18 131 lb 6.4 oz (59.6 kg)  11/14/17 129 lb 6.4 oz (58.7 kg)  09/26/17 125 lb 9.6 oz (57 kg)      Other studies Reviewed: Additional studies/ records that were reviewed today include:   Echo 08/18/16: Study Conclusions  - Left ventricle: The cavity size was normal. Wall thickness was   normal. Systolic function was normal. The estimated ejection   fraction was in the range of 60% to 65%. Wall motion was normal;   there were no regional wall motion abnormalities. Features are   consistent with a pseudonormal left ventricular filling pattern,   with concomitant abnormal relaxation and increased filling   pressure (grade 2 diastolic dysfunction). - Aortic valve: Mildly calcified annulus. Trileaflet; mildly   thickened, mildly calcified leaflets. There was mild   regurgitation. - Mitral valve: There was moderate regurgitation.  Impressions:  - No cardiac source of emboli was indentified.   ASSESSMENT AND PLAN:  1.  Atrial fibrillation. Rate was well controlled on atenolol and she is asymptomatic. I suspect  Afib is the cause of her embolic stroke in January. I would recommend anticoagulation to reduce risk of further strokes. Will continue Eliquis at 2.5 mg bid based on age and reduced body weight. On exam pulse feels regular today.  2. S/p embolic CVA.   3. HTN controlled.    Current medicines are reviewed at length with the patient today.  The patient does not have concerns regarding medicines.  The following changes have been made:  See above  Labs/ tests ordered today include:  No orders of the defined types were placed in this encounter.    Disposition:   FU with me in 6 months  Signed, Naythan Douthit Martinique, MD  02/06/2018 3:39 PM    [facility]  [place], [place], [place], [address] Phone [phone], Fax [phone]

## 2018-02-06 ENCOUNTER — Ambulatory Visit: Payer: Medicare Other | Admitting: Cardiology

## 2018-02-06 ENCOUNTER — Encounter: Payer: Self-pay | Admitting: Cardiology

## 2018-02-06 VITALS — BP 120/70 | HR 77 | Ht 61.0 in | Wt 131.4 lb

## 2018-02-06 DIAGNOSIS — I63413 Cerebral infarction due to embolism of bilateral middle cerebral arteries: Secondary | ICD-10-CM | POA: Diagnosis not present

## 2018-02-06 DIAGNOSIS — R600 Localized edema: Secondary | ICD-10-CM | POA: Diagnosis not present

## 2018-02-06 DIAGNOSIS — I481 Persistent atrial fibrillation: Secondary | ICD-10-CM

## 2018-02-06 DIAGNOSIS — I4819 Other persistent atrial fibrillation: Secondary | ICD-10-CM

## 2018-02-06 NOTE — Patient Instructions (Addendum)
Continue your current therapy  I will see you in 6 months.   

## 2018-02-14 ENCOUNTER — Encounter: Payer: Self-pay | Admitting: Family Medicine

## 2018-02-14 ENCOUNTER — Ambulatory Visit: Payer: Medicare Other | Admitting: Family Medicine

## 2018-02-14 ENCOUNTER — Ambulatory Visit: Payer: Self-pay | Admitting: Family Medicine

## 2018-02-14 VITALS — BP 150/80 | HR 78 | Temp 97.6°F | Ht 61.0 in | Wt 129.4 lb

## 2018-02-14 DIAGNOSIS — K644 Residual hemorrhoidal skin tags: Secondary | ICD-10-CM

## 2018-02-14 DIAGNOSIS — K625 Hemorrhage of anus and rectum: Secondary | ICD-10-CM | POA: Diagnosis not present

## 2018-02-14 DIAGNOSIS — K5909 Other constipation: Secondary | ICD-10-CM

## 2018-02-14 NOTE — Progress Notes (Signed)
   Subjective:    Patient ID: Christine Middleton, female    DOB: [birth year], 84 y.o.   MRN: [id number]  HPI Here for 2 days of rectal bleeding. She has been constipated and her last BM was one week ago. She feels a little bloated but denies any abdominal pain. No nausea or fever. On two occasions, last night and this morning, she was urinating and passed a fair amount of bright red blood into the toilet bowl. Her last colonoscopy in 2010 revealed diverticula and hemorrhoids. She takes Eliquis daily.    Review of Systems  Constitutional: Negative.   Respiratory: Negative.   Cardiovascular: Negative.   Gastrointestinal: Positive for anal bleeding and constipation. Negative for abdominal distention, abdominal pain, diarrhea, nausea, rectal pain and vomiting.  Genitourinary: Negative.        Objective:   Physical Exam  Constitutional: She appears well-developed and well-nourished. No distress.  Cardiovascular: Normal rate, regular rhythm, normal heart sounds and intact distal pulses.  Pulmonary/Chest: Effort normal and breath sounds normal.  Abdominal: Soft. Bowel sounds are normal. She exhibits no distension and no mass. There is no tenderness. There is no rebound and no guarding. No hernia.  Genitourinary:  Genitourinary Comments: There are 3 small external hemorrhoids with signs of recent bleeding. No active bleeding. No impaction is present.          Assessment & Plan:  She is having some hemorrhoidal bleeding secondary to constipation. She should drink plenty of water and take Miralax every day. She will pick up a bottle of magnesium citrate today and take this. Hopefully once her bowels start moving again the bleeding will stop. Recheck prn.  [name], MD

## 2018-02-14 NOTE — Telephone Encounter (Signed)
Called in c/o rectal bleeding and constipation.   Been constipated for a week.   She is straining a lot trying to have a BM.  I checked with the flow coordinator at the [facility] office to see if Dr. Sarajane Jews would see her in the office or if she should go to the ED.   He is willing to see her in the office.     I scheduled her for today at 11:30.    She said she could be there at 11:15.   She is going to call her ride to come take her now.    Reason for Disposition . Taking Coumadin (warfarin) or other strong blood thinner, or known bleeding disorder (e.g., thrombocytopenia)  Answer Assessment - Initial Assessment Questions 1. APPEARANCE of BLOOD: "What color is it?" "Is it passed separately, on the surface of the stool, or mixed in with the stool?"      Last night it was in the stool.   More than I've had before.   I've had bleeding hemorrhoids 2 years ago.   I saw Dr. Dalbert Batman.  He injected them.  No problem since. 2. AMOUNT: "How much blood was passed?"      I'm straining but can't have a BM for a week.   I had blood in the commode whereas it was just when I wiped before.     I saw Dr. Martinique about my heart rhythm  3. FREQUENCY: "How many times has blood been passed with the stools?"      One bloody stool when I got up this morning.   Yesterday I had a small BM when I wiped the blood would start.   Last night the blood started being in the toilet when I urinated.      911 came out last night after I called my son and told him about the bleeding.   My BP 139/85 this morning.   I'm eating and not in any pain. 4. ONSET: "When was the blood first seen in the stools?" (Days or weeks)      A week ago and it would be a little on the paper.   But last night it "broke loose and went into the toilet when I urinated".    5. DIARRHEA: "Is there also some diarrhea?" If so, ask: "How many diarrhea stools were passed in past 24 hours?"      Having constipation.   Had a stringy BM yesterday.   I took a laxative  this morning.   I'm just straining so hard trying to have a BM.   6. CONSTIPATION: "Do you have constipation?" If so, "How bad is it?"     Yes.   I've been taking Miralax.     Equate laxative OTC I took this morning.     I did not take my Eliquis today.   7. RECURRENT SYMPTOMS: "Have you had blood in your stools before?" If so, ask: "When was the last time?" and "What happened that time?"      2 years ago.   See above. 8. BLOOD THINNERS: "Do you take any blood thinners?" (e.g., Coumadin/warfarin, Pradaxa/dabigatran, aspirin)     Eliquis for A. Fib. 9. OTHER SYMPTOMS: "Do you have any other symptoms?"  (e.g., abdominal pain, vomiting, dizziness, fever)     Have dizziness when I get up too fast.  I've been that way lately.   I get up slowly.    No abd pain or pain anywhere.  I'm eating like usual.   I'm eating a lot of fruit. 10. PREGNANCY: "Is there any chance you are pregnant?" "When was your last menstrual period?"       Not asked due to age.  Protocols used: RECTAL BLEEDING-A-AH

## 2018-03-27 ENCOUNTER — Encounter: Payer: Self-pay | Admitting: Family Medicine

## 2018-03-27 ENCOUNTER — Ambulatory Visit: Payer: Medicare Other | Admitting: Family Medicine

## 2018-03-27 ENCOUNTER — Ambulatory Visit (INDEPENDENT_AMBULATORY_CARE_PROVIDER_SITE_OTHER): Payer: Medicare Other

## 2018-03-27 VITALS — BP 124/70 | HR 70 | Temp 97.6°F | Wt 130.4 lb

## 2018-03-27 DIAGNOSIS — M545 Low back pain, unspecified: Secondary | ICD-10-CM

## 2018-03-27 DIAGNOSIS — K5900 Constipation, unspecified: Secondary | ICD-10-CM

## 2018-03-27 MED ORDER — TRAMADOL HCL 50 MG PO TABS: 50.0000 mg | ORAL_TABLET | Freq: Four times a day (QID) | ORAL | 0 refills | Status: DC | PRN

## 2018-03-27 NOTE — Progress Notes (Signed)
   Subjective:    Patient ID: Christine Middleton, female    DOB: 06-May-1923, 84 y.o.   MRN: [id number]  HPI Here for 2 issues. First she has had some intermittent constipation for the past month. She averages one BM every 2-3 days. These consist of small firm balls of stool. She has some mild bloating in the abdomen but no pain. No nausea or fever. She uses Miralax once in awhile but never consistently. Also 10 days ago while attempting to move a couch she felt and heard a "pop" in the lower back and she has had an achy pain in this area ever since. She is taking Tylenol twice a day. Of note she had a compression fracture to T12 in 2016 from a fall.    Review of Systems  Constitutional: Negative.   Respiratory: Negative.   Cardiovascular: Negative.   Gastrointestinal: Positive for abdominal distention and constipation. Negative for abdominal pain, anal bleeding, blood in stool, diarrhea, nausea, rectal pain and vomiting.  Genitourinary: Negative.   Musculoskeletal: Positive for back pain.  Neurological: Negative.        Objective:   Physical Exam  Constitutional: She is oriented to person, place, and time. She appears well-developed and well-nourished. No distress.  She can get up on the exam table unassisted but has some pain when she does so   Cardiovascular: Normal rate, regular rhythm, normal heart sounds and intact distal pulses.  Pulmonary/Chest: Effort normal and breath sounds normal.  Abdominal: Soft. Bowel sounds are normal. She exhibits no distension and no mass. There is no tenderness. There is no rebound and no guarding. No hernia.  Musculoskeletal:  Mildly tender in the lower back with full ROM and negative SLR   Neurological: She is alert and oriented to person, place, and time.          Assessment & Plan:  She has mild constipation and I encouraged her to use Miralax every day. She will follow up if this is not successful. There is a good chance she has another small  compression fracture in the spine. We will get Xrays of the lumbar spine today. She already has some Tramadol at home and I urged her to use this for pain as needed.  [name], MD

## 2018-04-30 ENCOUNTER — Other Ambulatory Visit: Payer: Self-pay | Admitting: Family Medicine

## 2018-06-19 ENCOUNTER — Encounter: Payer: Self-pay | Admitting: Family Medicine

## 2018-06-19 ENCOUNTER — Ambulatory Visit: Payer: Medicare Other | Admitting: Family Medicine

## 2018-06-19 ENCOUNTER — Ambulatory Visit: Payer: Self-pay

## 2018-06-19 VITALS — BP 120/58 | HR 84 | Temp 98.0°F | Wt 136.2 lb

## 2018-06-19 DIAGNOSIS — R197 Diarrhea, unspecified: Secondary | ICD-10-CM

## 2018-06-19 LAB — CBC WITH DIFFERENTIAL/PLATELET
Basophils Absolute: 0.1 10*3/uL (ref 0.0–0.1)
Basophils Relative: 0.9 % (ref 0.0–3.0)
Eosinophils Absolute: 0.2 10*3/uL (ref 0.0–0.7)
Eosinophils Relative: 3.6 % (ref 0.0–5.0)
HCT: 36.6 % (ref 36.0–46.0)
Hemoglobin: 12.5 g/dL (ref 12.0–15.0)
Lymphocytes Relative: 14.9 % (ref 12.0–46.0)
Lymphs Abs: 1 10*3/uL (ref 0.7–4.0)
MCHC: 34.2 g/dL (ref 30.0–36.0)
MCV: 92.3 fl (ref 78.0–100.0)
Monocytes Absolute: 0.8 10*3/uL (ref 0.1–1.0)
Monocytes Relative: 11.4 % (ref 3.0–12.0)
Neutro Abs: 4.7 10*3/uL (ref 1.4–7.7)
Neutrophils Relative %: 69.2 % (ref 43.0–77.0)
Platelets: 224 10*3/uL (ref 150.0–400.0)
RBC: 3.97 Mil/uL (ref 3.87–5.11)
RDW: 12.8 % (ref 11.5–15.5)
WBC: 6.8 10*3/uL (ref 4.0–10.5)

## 2018-06-19 LAB — COMPREHENSIVE METABOLIC PANEL
ALT: 12 U/L (ref 0–35)
AST: 16 U/L (ref 0–37)
Albumin: 4.1 g/dL (ref 3.5–5.2)
Alkaline Phosphatase: 119 U/L — ABNORMAL HIGH (ref 39–117)
BUN: 18 mg/dL (ref 6–23)
CO2: 26 mEq/L (ref 19–32)
Calcium: 8.9 mg/dL (ref 8.4–10.5)
Chloride: 104 mEq/L (ref 96–112)
Creatinine, Ser: 0.94 mg/dL (ref 0.40–1.20)
GFR: 58.95 mL/min — ABNORMAL LOW (ref >60.00)
Glucose, Bld: 127 mg/dL — ABNORMAL HIGH (ref 70–99)
Potassium: 3.8 mEq/L (ref 3.5–5.1)
Sodium: 140 mEq/L (ref 135–145)
Total Bilirubin: 0.5 mg/dL (ref 0.2–1.2)
Total Protein: 6.7 g/dL (ref 6.0–8.3)

## 2018-06-19 NOTE — Progress Notes (Signed)
Christine Middleton DOB: 07/13/1923 Encounter date: 06/19/2018  This is a 84 y.o. female who presents with Chief Complaint  Patient presents with  . Diarrhea    started last night, every 2 hours, no diarrhea today, last bowel movement at 7am    History of present illness: Hx of external hemorrhoids; OV for constipation in July w recommendation to start miralax and take mg citrate at that time.  Didn't sleep any. Every 2 hours. Water and blood first (thinks hemorrhoids irritated). Couldn't control it. Not much appetite yesterday, but diarrhea didn't start until evening. Last loose stool was at 7am. Still no appetite. Did eat banana this morning and half piece of cheese toast. No vomiting.   Has some pain but this is related to prior compression fracture/back injuries.   Minimal urination. Did drink large glass of water this morning. Had some coffee this morning.   Last weds they were in ER due to visiting her brother who has passed. Daughter here with her states that she sometimes has expired food in fridge.     Allergies  Allergen Reactions  . Amoxicillin     REACTION: rash  . Codeine   . Lipitor [Atorvastatin]     Muscle pain, stiff joints  . Lisinopril   . Nitrofurantoin    Current Meds  Medication Sig  . amLODipine (NORVASC) 5 MG tablet TAKE 1 TABLET (5 MG TOTAL) BY MOUTH DAILY.  Marland Kitchen apixaban (ELIQUIS) 2.5 MG TABS tablet Take 1 tablet (2.5 mg total) by mouth 2 (two) times daily.  Marland Kitchen atenolol (TENORMIN) 50 MG tablet TAKE 2 TABLETS BY MOUTH EVERY DAY  . losartan (COZAAR) 100 MG tablet Take 1 tablet (100 mg total) by mouth daily.  . pantoprazole (PROTONIX) 40 MG tablet Take 1 tablet (40 mg total) by mouth daily.  . simvastatin (ZOCOR) 20 MG tablet TAKE 1 TABLET (20 MG TOTAL) BY MOUTH AT BEDTIME.  . traMADol (ULTRAM) 50 MG tablet Take 1 tablet (50 mg total) by mouth every 6 (six) hours as needed for moderate pain.    Review of Systems  Constitutional: Positive for fatigue. Negative  for chills and fever.  Respiratory: Negative for cough, chest tightness, shortness of breath and wheezing.   Cardiovascular: Negative for chest pain, palpitations and leg swelling.  Gastrointestinal: Positive for anal bleeding (with wiping), blood in stool and diarrhea. Negative for abdominal distention, abdominal pain, constipation (has been quite regular lately), nausea, rectal pain and vomiting.  Neurological: Positive for weakness. Negative for dizziness and light-headedness.    Objective:  BP (!) 120/58 (BP Location: Left Arm, Patient Position: Sitting, Cuff Size: Normal)   Pulse 84   Temp 98 F (36.7 C) (Oral)   Wt 136 lb 3.2 oz (61.8 kg)   SpO2 96%   BMI 25.73 kg/m   Weight: 136 lb 3.2 oz (61.8 kg)   BP Readings from Last 3 Encounters:  06/19/18 (!) 120/58  03/27/18 124/70  02/14/18 (!) 150/80   Wt Readings from Last 3 Encounters:  06/19/18 136 lb 3.2 oz (61.8 kg)  03/27/18 130 lb 6 oz (59.1 kg)  02/14/18 129 lb 6.4 oz (58.7 kg)    Physical Exam  Constitutional: She is oriented to person, place, and time. She appears well-developed and well-nourished. No distress.  Cardiovascular: Normal rate, regular rhythm and normal heart sounds. Exam reveals no friction rub.  No murmur heard. No lower extremity edema  Pulmonary/Chest: Effort normal and breath sounds normal. No respiratory distress. She has no wheezes.  She has no rales.  Abdominal: Soft. Bowel sounds are normal. She exhibits no distension and no mass. There is no tenderness. There is no rebound and no guarding.  Genitourinary: Rectal exam shows external hemorrhoid and guaiac positive stool. Rectal exam shows no fissure. Tenderness: mild on external hemorrhoid. No obvious active bleeding; no thrombosis.  Neurological: She is alert and oriented to person, place, and time.  Psychiatric: Her behavior is normal. Cognition and memory are normal.    Assessment/Plan 1. Diarrhea, unspecified type Has not had movement since  this morning.  She has tolerated some p.o. intake.  Encouraged her to take in Gatorade, which they have at the house.  Monitor symptoms.  We will get some basic blood work and let her know about these results.  She does have her brother's funeral to attend today, so if possible I will send results via my chart.  Certainly let us know if any worsening of symptoms in the meanwhile. - Comprehensive metabolic panel; Future - CBC with Differential/Platelet; Future - Comprehensive metabolic panel - CBC with Differential/Platelet     Lawana Chambers: [phone]   Micheline Rough, MD

## 2018-06-19 NOTE — Telephone Encounter (Signed)
Daughter called to report pt. C/o onset of diarrhea stools with some blood, last night. Daughter was not with the pt., but driving to her home at this time.  Reported the pt. Just lost her brother, and his funeral is this evening.    Phone call to pt.  reported she was up about every 2 hrs., and passed watery stools with small amt. formed stool in it; noted she had small streaks of blood in toilet water, and on toilet tissue, about 4 times.  Denied abd. pain.  Reported she feels tired, and has no appetite.  Denied fever/ chills or body aches.  Denied increased weakness, or decreased urine output.  Stated mouth is dry, and she is trying to drink water.  Appt. Scheduled at PCP office today at 10:30 AM.  Care Advice given per protocol.   Called pt's daughter back; advised of appt. Today.  Daughter verb. Understanding.          Reason for Disposition . [1] MODERATE diarrhea (e.g., 4-6 times / day more than normal) AND [2] present > 48 hours (2 days)  Answer Assessment - Initial Assessment Questions 1. DIARRHEA SEVERITY: "How bad is the diarrhea?" "How many extra stools have you had in the past 24 hours than normal?"    - NO DIARRHEA (SCALE 0)   - MILD (SCALE 1-3): Few loose or mushy BMs; increase of 1-3 stools over normal daily number of stools; mild increase in ostomy output.   -  MODERATE (SCALE 4-7): Increase of 4-6 stools daily over normal; moderate increase in ostomy output. * SEVERE (SCALE 8-10; OR 'WORST POSSIBLE'): Increase of 7 or more stools daily over normal; moderate increase in ostomy output; incontinence.     Up and down all night with watery stools; about every 2 hours 2. ONSET: "When did the diarrhea begin?"      During the night 3. BM CONSISTENCY: "How loose or watery is the diarrhea?"      Watery with some stool in it 4. VOMITING: "Are you also vomiting?" If so, ask: "How many times in the past 24 hours?"      No  5. ABDOMINAL PAIN: "Are you having any abdominal pain?" If yes:  "What does it feel like?" (e.g., crampy, dull, intermittent, constant)     No  6. ABDOMINAL PAIN SEVERITY: If present, ask: "How bad is the pain?"  (e.g., Scale 1-10; mild, moderate, or severe)   - MILD (1-3): doesn't interfere with normal activities, abdomen soft and not tender to touch    - MODERATE (4-7): interferes with normal activities or awakens from sleep, tender to touch    - SEVERE (8-10): excruciating pain, doubled over, unable to do any normal activities       n/a 7. ORAL INTAKE: If vomiting, "Have you been able to drink liquids?" "How much fluids have you had in the past 24 hours?"     Reported trying to drink plenty of water 8. HYDRATION: "Any signs of dehydration?" (e.g., dry mouth [not just dry lips], too weak to stand, dizziness, new weight loss) "When did you last urinate?"     Mouth dry at night; denies increased weakness 9. EXPOSURE: "Have you traveled to a foreign country recently?" "Have you been exposed to anyone with diarrhea?" "Could you have eaten any food that was spoiled?"     Denied eating anything she felt was spoiled 10. ANTIBIOTIC USE: "Are you taking antibiotics now or have you taken antibiotics in the past 2  months?"       no 11. OTHER SYMPTOMS: "Do you have any other symptoms?" (e.g., fever, blood in stool)       Saw a little blood in the water, but had blood on toilet tissue about 4 times; has hx of hemorrhoids; on a blood thinner; denied fever/ chills; lack of appetite, lack of energy   12. PREGNANCY: "Is there any chance you are pregnant?" "When was your last menstrual period?"      N/a  Protocols used: DIARRHEA-A-AH

## 2018-07-11 ENCOUNTER — Other Ambulatory Visit: Payer: Self-pay | Admitting: Family Medicine

## 2018-08-04 ENCOUNTER — Other Ambulatory Visit: Payer: Self-pay | Admitting: Family Medicine

## 2018-09-11 ENCOUNTER — Ambulatory Visit: Payer: Self-pay

## 2018-09-11 ENCOUNTER — Encounter: Payer: Self-pay | Admitting: Cardiology

## 2018-09-11 NOTE — Telephone Encounter (Signed)
Patient's daughter called and says the patient had a nose bleed today in the grocery store, not a bad nose bleed, but she had to dab the nose and it was blood on the tissue. She says the patient reported having 2 other nose bleeds about 1-2 weeks apart, waking up with a spot of blood on the sheets. She says it's always the left nostril. She denies lightheadedness or weakness. She says her mother is fine. She says that her mother said that she didn't take a dose of Eliquis the last time it happened. She says she wants to know if this is the right thing to do. I advised I will send to Dr. Sarajane Jews and someone from the office will call with his recommendation. She says a call or a MyChart message is fine, whichever is easier.  Reason for Disposition . [1] Mild-moderate nosebleed AND [2] bleeding stopped now  Answer Assessment - Initial Assessment Questions 1. AMOUNT OF BLEEDING: "How bad is the bleeding?" "How much blood was lost?" "Has the bleeding stopped?"   - MILD: needed a couple tissues   - MODERATE: needed many tissues   - SEVERE: large blood clots, soaked many tissues, lasted more than 30 minutes      Mild 2. ONSET: "When did the nosebleed start?"      Today once; another time a week ago (maybe 2 nights) 3. FREQUENCY: "How many nosebleeds have you had in the last 24 hours?"      1 4. RECURRENT SYMPTOMS: "Have there been other recent nosebleeds?" If so, ask: "How long did it take you to stop the bleeding?" "What worked best?"     No 5. CAUSE: "What do you think caused this nosebleed?"     I don't know 6. LOCAL FACTORS: "Do you have any cold symptoms?", "Have you been rubbing or picking at your nose?"     No 7. SYSTEMIC FACTORS: "Do you have high blood pressure or any bleeding problems?"     Yes HTN; no bleeding problems 8. BLOOD THINNERS: "Do you take any blood thinners?" (e.g., coumadin, heparin, aspirin, Plavix)     Eliquis 9. OTHER SYMPTOMS: "Do you have any other symptoms?" (e.g.,  lightheadedness)     No 10. PREGNANCY: "Is there any chance you are pregnant?" "When was your last menstrual period?"       No  Protocols used: NOSEBLEED-A-AH

## 2018-09-11 NOTE — Telephone Encounter (Signed)
Please advise 

## 2018-09-12 NOTE — Telephone Encounter (Signed)
lmomtcb x 1 for carolyn to call me back.

## 2018-09-12 NOTE — Telephone Encounter (Signed)
Tell her to NEVER skip doses of Eliquis. If the nosebleeds persist, she should see [place]

## 2018-09-23 NOTE — Progress Notes (Signed)
Cardiology Office Note   Date:  09/26/2018   ID:  Christine Middleton, DOB 1923/06/28, MRN [id number]  PCP:  Laurey Morale, MD  Cardiologist:   Peter Martinique, MD   Chief Complaint  Patient presents with  . Atrial Fibrillation      History of Present Illness: Christine Middleton is a 85 y.o. female who is seen at the request of Dr. Sarajane Jews for evaluation of Atrial fibrillation. She has a history of HTN, HLD, and CVA. She was admitted in January 2018 with CVA with left facial droop. 2-D echo 08/18/2016 - EF 60-65%, no regional wall motion abnormality. Grade 2 diastolic dysfunction. No cardiac source of emboli. Carotid Doppler 08/18/2016 - negative. A1c-5.8. 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 Patient declinedTEE/Loop Recorder. She was maintained on Plavix.   On subsequent follow up with Dr. Sarajane Jews she was noted to be in Afib with controlled rate. She was anticoagulated with Eliquis.   On follow up today she is seen with her daughter. She has had some minor nosebleeds. Uses a wood burning stove and her daughter says she keeps it at 80 degrees. Occasionally feels "woozy". She is hard of hearing. No new Neurologic symptoms. No chest pain or dyspnea.     Past Medical History:  Diagnosis Date  . Diverticulitis   . Esophageal stricture   . GERD (gastroesophageal reflux disease)   . Hyperlipidemia   . Hypertension   . Shingles   . Stroke ([place])   . Vitamin B 12 deficiency     Past Surgical History:  Procedure Laterality Date  . ABDOMINAL HYSTERECTOMY    . APPENDECTOMY    . COLONOSCOPY  12-09-08   per Dr. Sharlett Iles (incomplete along with barium enema 12-10-08) with hemorrhoids  and diverticulae  . ESOPHAGOGASTRODUODENOSCOPY  09-2004   per Dr. Sharlett Iles with dilatation   . EYE SURGERY    . TONSILLECTOMY       Current Outpatient  Medications  Medication Sig Dispense Refill  . amLODipine (NORVASC) 5 MG tablet TAKE 1 TABLET (5 MG TOTAL) BY MOUTH DAILY. 90 tablet 1  . apixaban (ELIQUIS) 2.5 MG TABS tablet Take 1 tablet (2.5 mg total) by mouth 2 (two) times daily. 180 tablet 3  . atenolol (TENORMIN) 50 MG tablet TAKE 2 TABLETS BY MOUTH EVERY DAY 180 tablet 3  . losartan (COZAAR) 100 MG tablet TAKE 1 TABLET BY MOUTH EVERY DAY 90 tablet 3  . pantoprazole (PROTONIX) 40 MG tablet Take 1 tablet (40 mg total) by mouth daily. 90 tablet 3  . simvastatin (ZOCOR) 20 MG tablet TAKE 1 TABLET (20 MG TOTAL) BY MOUTH AT BEDTIME. 90 tablet 3   No current facility-administered medications for this visit.     Allergies:   Amoxicillin; Codeine; Lipitor [atorvastatin]; Lisinopril; and Nitrofurantoin    Social History:  The patient  reports that she has never smoked. She has never used smokeless tobacco. She reports that she does not drink alcohol or use drugs.   Family History:  The patient's family history includes Breast cancer in an other family member; Cancer in her brother and brother; Diabetes in her brother and another family member; Heart attack in her mother; Heart disease in her father and another family member; Hypertension in an other family member; Stroke in her sister and another family member.    ROS:  Please see  the history of present illness.   Otherwise, review of systems are positive for none.   All other systems are reviewed and negative.    PHYSICAL EXAM: VS:  BP 134/72   Pulse 77   Ht 5\' 2"  (1.575 m)   Wt 142 lb (64.4 kg)   BMI 25.97 kg/m  , BMI Body mass index is 25.97 kg/m. GENERAL:  Well appearing elderly WF in NAD HEENT:  PERRL, EOMI, sclera are clear. Oropharynx is clear. NECK:  No jugular venous distention, carotid upstroke brisk and symmetric, no bruits, no thyromegaly or adenopathy LUNGS:  Clear to auscultation bilaterally CHEST:  Unremarkable HEART:  RRR,  PMI not displaced or sustained,S1 and S2  within normal limits, no S3, no S4: no clicks, no rubs, no murmurs ABD:  Soft, nontender. BS +, no masses or bruits. No hepatomegaly, no splenomegaly EXT:  2 + pulses throughout, no edema, no cyanosis no clubbing SKIN:  Warm and dry.  No rashes NEURO:  Alert and oriented x 3. Cranial nerves II through XII intact. PSYCH:  Cognitively intact    EKG:  EKG is  ordered today. NSR rate 77. LAD, possible old septal infarct. I have personally reviewed and interpreted this study.    Recent Labs: 06/19/2018: ALT 12; BUN 18; Creatinine, Ser 0.94; Hemoglobin 12.5; Platelets 224.0; Potassium 3.8; Sodium 140    Lipid Panel    Component Value Date/Time   CHOL 160 09/26/2017 1119   TRIG 85.0 09/26/2017 1119   TRIG 92 05/28/2006 1104   HDL 81.10 09/26/2017 1119   CHOLHDL 2 09/26/2017 1119   VLDL 17.0 09/26/2017 1119   LDLCALC 62 09/26/2017 1119   LDLDIRECT 113.3 07/18/2012 1113      Wt Readings from Last 3 Encounters:  09/26/18 142 lb (64.4 kg)  06/19/18 136 lb 3.2 oz (61.8 kg)  03/27/18 130 lb 6 oz (59.1 kg)      Other studies Reviewed: Additional studies/ records that were reviewed today include:   Echo 08/18/16: Study Conclusions  - Left ventricle: The cavity size was normal. Wall thickness was   normal. Systolic function was normal. The estimated ejection   fraction was in the range of 60% to 65%. Wall motion was normal;   there were no regional wall motion abnormalities. Features are   consistent with a pseudonormal left ventricular filling pattern,   with concomitant abnormal relaxation and increased filling   pressure (grade 2 diastolic dysfunction). - Aortic valve: Mildly calcified annulus. Trileaflet; mildly   thickened, mildly calcified leaflets. There was mild   regurgitation. - Mitral valve: There was moderate regurgitation.  Impressions:  - No cardiac source of emboli was indentified.   ASSESSMENT AND PLAN:  1.  Atrial fibrillation. Rate was well controlled  on atenolol and she is asymptomatic. 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 Will continue Eliquis at 2.5 mg bid based on age and reduced body weight. She is in sinus rhythm today.  2. S/p embolic CVA.   3. HTN controlled.    Current medicines are reviewed at length with the patient today.  The patient does not have concerns regarding medicines.  The following changes have been made:  See above  Labs/ tests ordered today include:  No orders of the defined types were placed in this encounter.    Disposition:   FU with me in 6 months  Signed, Peter Martinique, MD  09/26/2018 4:36 PM    [facility] [address], [place],  [place], [address] Phone [phone], Fax [phone]

## 2018-09-26 ENCOUNTER — Ambulatory Visit: Payer: Medicare Other | Admitting: Cardiology

## 2018-09-26 ENCOUNTER — Encounter: Payer: Self-pay | Admitting: Cardiology

## 2018-09-26 VITALS — BP 134/72 | HR 77 | Ht 62.0 in | Wt 142.0 lb

## 2018-09-26 DIAGNOSIS — I63413 Cerebral infarction due to embolism of bilateral middle cerebral arteries: Secondary | ICD-10-CM

## 2018-09-26 DIAGNOSIS — I480 Paroxysmal atrial fibrillation: Secondary | ICD-10-CM | POA: Diagnosis not present

## 2018-09-26 MED ORDER — APIXABAN 2.5 MG PO TABS: 2.5000 mg | ORAL_TABLET | Freq: Two times a day (BID) | ORAL | 3 refills | Status: DC

## 2018-09-26 NOTE — Addendum Note (Signed)
Addended by: Kathyrn Lass on: 09/26/2018 04:42 PM   Modules accepted: Orders

## 2018-10-02 ENCOUNTER — Encounter: Payer: Self-pay | Admitting: Family Medicine

## 2018-10-02 ENCOUNTER — Ambulatory Visit (INDEPENDENT_AMBULATORY_CARE_PROVIDER_SITE_OTHER): Payer: Medicare Other | Admitting: Family Medicine

## 2018-10-02 VITALS — BP 128/80 | HR 70 | Temp 97.9°F | Ht 62.0 in | Wt 137.4 lb

## 2018-10-02 DIAGNOSIS — Z23 Encounter for immunization: Secondary | ICD-10-CM

## 2018-10-02 DIAGNOSIS — Z0000 Encounter for general adult medical examination without abnormal findings: Secondary | ICD-10-CM | POA: Diagnosis not present

## 2018-10-02 DIAGNOSIS — R739 Hyperglycemia, unspecified: Secondary | ICD-10-CM | POA: Diagnosis not present

## 2018-10-02 LAB — CBC WITH DIFFERENTIAL/PLATELET
Basophils Absolute: 0.1 10*3/uL (ref 0.0–0.1)
Basophils Relative: 1.1 % (ref 0.0–3.0)
Eosinophils Absolute: 0.4 10*3/uL (ref 0.0–0.7)
Eosinophils Relative: 5.5 % — ABNORMAL HIGH (ref 0.0–5.0)
HCT: 36.9 % (ref 36.0–46.0)
Hemoglobin: 12.5 g/dL (ref 12.0–15.0)
Lymphocytes Relative: 16.1 % (ref 12.0–46.0)
Lymphs Abs: 1.3 10*3/uL (ref 0.7–4.0)
MCHC: 33.8 g/dL (ref 30.0–36.0)
MCV: 92.3 fl (ref 78.0–100.0)
Monocytes Absolute: 0.7 10*3/uL (ref 0.1–1.0)
Monocytes Relative: 8.4 % (ref 3.0–12.0)
Neutro Abs: 5.4 10*3/uL (ref 1.4–7.7)
Neutrophils Relative %: 68.9 % (ref 43.0–77.0)
Platelets: 239 10*3/uL (ref 150.0–400.0)
RBC: 4 Mil/uL (ref 3.87–5.11)
RDW: 13.4 % (ref 11.5–15.5)
WBC: 7.9 10*3/uL (ref 4.0–10.5)

## 2018-10-02 LAB — POC URINALSYSI DIPSTICK (AUTOMATED)
Bilirubin, UA: NEGATIVE
Blood, UA: NEGATIVE
Glucose, UA: NEGATIVE
Ketones, UA: NEGATIVE
Leukocytes, UA: NEGATIVE
Nitrite, UA: NEGATIVE
Protein, UA: NEGATIVE
Spec Grav, UA: 1.02 (ref 1.010–1.025)
Urobilinogen, UA: 0.2 E.U./dL
pH, UA: 5.5 (ref 5.0–8.0)

## 2018-10-02 LAB — LIPID PANEL
Cholesterol: 190 mg/dL (ref 0–200)
HDL: 94.1 mg/dL (ref >39.00)
LDL Cholesterol: 79 mg/dL (ref 0–99)
NonHDL: 95.9
Total CHOL/HDL Ratio: 2
Triglycerides: 87 mg/dL (ref 0.0–149.0)
VLDL: 17.4 mg/dL (ref 0.0–40.0)

## 2018-10-02 LAB — HEPATIC FUNCTION PANEL
ALT: 12 U/L (ref 0–35)
AST: 16 U/L (ref 0–37)
Albumin: 4.1 g/dL (ref 3.5–5.2)
Alkaline Phosphatase: 95 U/L (ref 39–117)
Bilirubin, Direct: 0.2 mg/dL (ref 0.0–0.3)
Total Bilirubin: 1 mg/dL (ref 0.2–1.2)
Total Protein: 7 g/dL (ref 6.0–8.3)

## 2018-10-02 LAB — BASIC METABOLIC PANEL
BUN: 17 mg/dL (ref 6–23)
CO2: 25 mEq/L (ref 19–32)
Calcium: 9.3 mg/dL (ref 8.4–10.5)
Chloride: 106 mEq/L (ref 96–112)
Creatinine, Ser: 0.91 mg/dL (ref 0.40–1.20)
GFR: 57.54 mL/min — ABNORMAL LOW (ref >60.00)
Glucose, Bld: 121 mg/dL — ABNORMAL HIGH (ref 70–99)
Potassium: 4.2 mEq/L (ref 3.5–5.1)
Sodium: 141 mEq/L (ref 135–145)

## 2018-10-02 LAB — HEMOGLOBIN A1C: Hgb A1c MFr Bld: 5.7 % (ref 4.6–6.5)

## 2018-10-02 LAB — TSH: TSH: 0.99 u[IU]/mL (ref 0.35–4.50)

## 2018-10-02 MED ORDER — AMLODIPINE BESYLATE 5 MG PO TABS: 5.0000 mg | ORAL_TABLET | Freq: Every day | ORAL | 3 refills | Status: DC

## 2018-10-02 MED ORDER — ATENOLOL 50 MG PO TABS: 100.0000 mg | ORAL_TABLET | Freq: Every day | ORAL | 3 refills | Status: DC

## 2018-10-02 MED ORDER — PANTOPRAZOLE SODIUM 40 MG PO TBEC: 40.0000 mg | DELAYED_RELEASE_TABLET | Freq: Every day | ORAL | 3 refills | Status: DC

## 2018-10-02 NOTE — Addendum Note (Signed)
Addended by: Elie Confer on: 10/02/2018 01:34 PM   Modules accepted: Orders

## 2018-10-02 NOTE — Progress Notes (Addendum)
Subjective:    Patient ID: Christine Middleton, female    DOB: 1922-08-16, 85 y.o.   MRN: [id number]  HPI Here with her daughter for a well exam. She feels fine and they have no complaints. She has had hearing problems for years and she asks about recommendations for a hearing test. She recently saw Dr. Peter Martinique for a cardiology check up, and he was pleased with her progress. Also she burned her finger 2 days ago while working on a craft project using a hot glue gun.    Review of Systems  Constitutional: Negative.   HENT: Positive for hearing loss.   Eyes: Negative.   Respiratory: Negative.   Cardiovascular: Negative.   Gastrointestinal: Negative.   Genitourinary: Negative for decreased urine volume, difficulty urinating, dyspareunia, dysuria, enuresis, flank pain, frequency, hematuria, pelvic pain and urgency.  Musculoskeletal: Negative.   Skin: Positive for wound.  Neurological: Negative.   Psychiatric/Behavioral: Negative.        Objective:   Physical Exam Constitutional:      General: She is not in acute distress.    Appearance: She is well-developed.  HENT:     Head: Normocephalic and atraumatic.     Right Ear: External ear normal.     Left Ear: External ear normal.     Nose: Nose normal.     Mouth/Throat:     Pharynx: No oropharyngeal exudate.  Eyes:     General: No scleral icterus.    Conjunctiva/sclera: Conjunctivae normal.     Pupils: Pupils are equal, round, and reactive to light.  Neck:     Musculoskeletal: Normal range of motion and neck supple.     Thyroid: No thyromegaly.     Vascular: No JVD.  Cardiovascular:     Rate and Rhythm: Normal rate and regular rhythm.     Heart sounds: Normal heart sounds. No murmur. No friction rub. No gallop.   Pulmonary:     Effort: Pulmonary effort is normal. No respiratory distress.     Breath sounds: Normal breath sounds. No wheezing or rales.  Chest:     Chest wall: No tenderness.  Abdominal:     General: Bowel sounds  are normal. There is no distension.     Palpations: Abdomen is soft. There is no mass.     Tenderness: There is no abdominal tenderness. There is no guarding or rebound.  Musculoskeletal: Normal range of motion.        General: No tenderness.  Lymphadenopathy:     Cervical: No cervical adenopathy.  Skin:    General: Skin is dry.     Findings: No erythema or rash.     Comments: The left index finger has a scabbed lesion on the tip   Neurological:     Mental Status: She is alert and oriented to person, place, and time.     Cranial Nerves: No cranial nerve deficit.     Motor: No abnormal muscle tone.     Coordination: Coordination normal.     Deep Tendon Reflexes: Reflexes are normal and symmetric. Reflexes normal.  Psychiatric:        Behavior: Behavior normal.        Thought Content: Thought content normal.        Judgment: Judgment normal.           Assessment & Plan:  Well exam. We discussed diet and exercise. Get fasting labs. She will have her hearing evaluated. The burn on her finger appears  to be healing well but we will give her a tetanus booster for protection.  Christine Penna, MD

## 2018-10-03 NOTE — Addendum Note (Signed)
Addended by: [name] A on: 10/03/2018 10:15 AM   Modules accepted: Orders

## 2019-06-03 ENCOUNTER — Other Ambulatory Visit: Payer: Self-pay | Admitting: Family Medicine

## 2019-07-02 ENCOUNTER — Ambulatory Visit (INDEPENDENT_AMBULATORY_CARE_PROVIDER_SITE_OTHER): Payer: Medicare Other | Admitting: Family Medicine

## 2019-07-02 ENCOUNTER — Encounter: Payer: Self-pay | Admitting: Family Medicine

## 2019-07-02 ENCOUNTER — Other Ambulatory Visit: Payer: Self-pay

## 2019-07-02 VITALS — BP 130/70 | HR 67 | Temp 97.3°F | Ht 62.0 in | Wt 144.0 lb

## 2019-07-02 DIAGNOSIS — Z23 Encounter for immunization: Secondary | ICD-10-CM | POA: Diagnosis not present

## 2019-07-02 DIAGNOSIS — K644 Residual hemorrhoidal skin tags: Secondary | ICD-10-CM | POA: Diagnosis not present

## 2019-07-02 MED ORDER — PRAMOXINE HCL 1 % EX CREA: 1.0000 "application " | TOPICAL_CREAM | Freq: Three times a day (TID) | CUTANEOUS | 2 refills | Status: DC

## 2019-07-02 NOTE — Progress Notes (Signed)
   Subjective:    Patient ID: Christine Middleton, female    DOB: 1923-05-23, 85 y.o.   MRN: [id number]  HPI Here with her daughter for 4 days of intermittent bright red blood in the commode or in her Depends. There is no pain or discomfort. Her stools pass easily. She has a hx of hemorrhoids. No urinary issues. She is S/P a hysterectomy. She takes Eliquis.    Review of Systems  Constitutional: Negative.   Respiratory: Negative.   Cardiovascular: Negative.   Gastrointestinal: Positive for anal bleeding. Negative for abdominal distention, abdominal pain, blood in stool, constipation, diarrhea and rectal pain.       Objective:   Physical Exam Constitutional:      Appearance: Normal appearance.  Cardiovascular:     Rate and Rhythm: Normal rate and regular rhythm.     Pulses: Normal pulses.     Heart sounds: Normal heart sounds.  Pulmonary:     Effort: Pulmonary effort is normal.     Breath sounds: Normal breath sounds.  Abdominal:     General: Abdomen is flat. Bowel sounds are normal. There is no distension.     Palpations: Abdomen is soft. There is no mass.     Tenderness: There is no abdominal tenderness. There is no guarding or rebound.     Hernia: No hernia is present.  Genitourinary:    Comments: There are several small inflamed external hemorrhoids  Neurological:     Mental Status: She is alert.           Assessment & Plan:  Hemorrhoidal bleeding. She can use Pramoxine cream as needed. Recheck prn. [name], MD

## 2019-07-03 ENCOUNTER — Telehealth: Payer: Self-pay | Admitting: Family Medicine

## 2019-07-03 NOTE — Telephone Encounter (Signed)
Pharmacy called again and is requesting an update. Please advise.

## 2019-07-03 NOTE — Telephone Encounter (Signed)
Copied from [place] [id number]. Topic: General - Call Back - No Documentation >> Jul 02, 2019  5:07 PM Erick Blinks wrote: Best contact [phone] Pramoxine HCl 1 % CREA not available, is in available in a foam. Call back needed [place]

## 2019-07-03 NOTE — Telephone Encounter (Signed)
See note

## 2019-07-04 NOTE — Telephone Encounter (Signed)
Per Dr.Fry verbally ok for foam Rx instead of cream. Called pharmacy and advised Tammy at the pharmacy ok to use. Tammy stated that insurance will not cover cream or foam and it will cost pt $70. Tammy stated that the pt can get it OTC. Spoke to pt and advise of update.

## 2019-07-04 NOTE — Telephone Encounter (Signed)
Pt stated that Chrissy gave her an OTC Rx for the issue. PT stated that she will call the pharmacy if she needs it. Pt will call her daughter for update.

## 2019-07-08 ENCOUNTER — Other Ambulatory Visit: Payer: Self-pay

## 2019-07-08 ENCOUNTER — Encounter (HOSPITAL_COMMUNITY): Payer: Self-pay | Admitting: Radiology

## 2019-07-08 ENCOUNTER — Inpatient Hospital Stay (HOSPITAL_COMMUNITY)
Admission: EM | Admit: 2019-07-08 | Discharge: 2019-07-11 | DRG: 394 | Disposition: A | Discharge status: Home / Self Care | Payer: Medicare Other | Attending: Internal Medicine | Admitting: Internal Medicine

## 2019-07-08 ENCOUNTER — Emergency Department (HOSPITAL_COMMUNITY): Payer: Medicare Other

## 2019-07-08 DIAGNOSIS — K649 Unspecified hemorrhoids: Secondary | ICD-10-CM

## 2019-07-08 DIAGNOSIS — K642 Third degree hemorrhoids: Secondary | ICD-10-CM | POA: Diagnosis not present

## 2019-07-08 DIAGNOSIS — Z833 Family history of diabetes mellitus: Secondary | ICD-10-CM

## 2019-07-08 DIAGNOSIS — Z8249 Family history of ischemic heart disease and other diseases of the circulatory system: Secondary | ICD-10-CM

## 2019-07-08 DIAGNOSIS — Z885 Allergy status to narcotic agent status: Secondary | ICD-10-CM

## 2019-07-08 DIAGNOSIS — Z888 Allergy status to other drugs, medicaments and biological substances status: Secondary | ICD-10-CM

## 2019-07-08 DIAGNOSIS — K5730 Diverticulosis of large intestine without perforation or abscess without bleeding: Secondary | ICD-10-CM | POA: Diagnosis present

## 2019-07-08 DIAGNOSIS — D125 Benign neoplasm of sigmoid colon: Secondary | ICD-10-CM | POA: Diagnosis not present

## 2019-07-08 DIAGNOSIS — I480 Paroxysmal atrial fibrillation: Secondary | ICD-10-CM | POA: Diagnosis not present

## 2019-07-08 DIAGNOSIS — I959 Hypotension, unspecified: Secondary | ICD-10-CM | POA: Diagnosis not present

## 2019-07-08 DIAGNOSIS — I10 Essential (primary) hypertension: Secondary | ICD-10-CM | POA: Diagnosis not present

## 2019-07-08 DIAGNOSIS — Z90710 Acquired absence of both cervix and uterus: Secondary | ICD-10-CM

## 2019-07-08 DIAGNOSIS — Z803 Family history of malignant neoplasm of breast: Secondary | ICD-10-CM | POA: Diagnosis not present

## 2019-07-08 DIAGNOSIS — Z823 Family history of stroke: Secondary | ICD-10-CM | POA: Diagnosis not present

## 2019-07-08 DIAGNOSIS — Z66 Do not resuscitate: Secondary | ICD-10-CM | POA: Diagnosis present

## 2019-07-08 DIAGNOSIS — D62 Acute posthemorrhagic anemia: Secondary | ICD-10-CM | POA: Diagnosis not present

## 2019-07-08 DIAGNOSIS — Z79899 Other long term (current) drug therapy: Secondary | ICD-10-CM

## 2019-07-08 DIAGNOSIS — Z8673 Personal history of transient ischemic attack (TIA), and cerebral infarction without residual deficits: Secondary | ICD-10-CM | POA: Diagnosis not present

## 2019-07-08 DIAGNOSIS — K219 Gastro-esophageal reflux disease without esophagitis: Secondary | ICD-10-CM | POA: Diagnosis not present

## 2019-07-08 DIAGNOSIS — Z7901 Long term (current) use of anticoagulants: Secondary | ICD-10-CM | POA: Diagnosis not present

## 2019-07-08 DIAGNOSIS — S3669XA Other injury of rectum, initial encounter: Secondary | ICD-10-CM | POA: Diagnosis present

## 2019-07-08 DIAGNOSIS — Q438 Other specified congenital malformations of intestine: Secondary | ICD-10-CM

## 2019-07-08 DIAGNOSIS — Z20828 Contact with and (suspected) exposure to other viral communicable diseases: Secondary | ICD-10-CM | POA: Diagnosis not present

## 2019-07-08 DIAGNOSIS — I482 Chronic atrial fibrillation, unspecified: Secondary | ICD-10-CM | POA: Diagnosis not present

## 2019-07-08 DIAGNOSIS — E785 Hyperlipidemia, unspecified: Secondary | ICD-10-CM | POA: Diagnosis not present

## 2019-07-08 DIAGNOSIS — R58 Hemorrhage, not elsewhere classified: Secondary | ICD-10-CM | POA: Diagnosis not present

## 2019-07-08 DIAGNOSIS — K625 Hemorrhage of anus and rectum: Secondary | ICD-10-CM | POA: Diagnosis present

## 2019-07-08 DIAGNOSIS — K7689 Other specified diseases of liver: Secondary | ICD-10-CM | POA: Diagnosis not present

## 2019-07-08 DIAGNOSIS — D500 Iron deficiency anemia secondary to blood loss (chronic): Secondary | ICD-10-CM

## 2019-07-08 DIAGNOSIS — Y92239 Unspecified place in hospital as the place of occurrence of the external cause: Secondary | ICD-10-CM | POA: Diagnosis not present

## 2019-07-08 DIAGNOSIS — K635 Polyp of colon: Secondary | ICD-10-CM | POA: Diagnosis not present

## 2019-07-08 DIAGNOSIS — Z03818 Encounter for observation for suspected exposure to other biological agents ruled out: Secondary | ICD-10-CM | POA: Diagnosis not present

## 2019-07-08 DIAGNOSIS — K648 Other hemorrhoids: Secondary | ICD-10-CM | POA: Diagnosis not present

## 2019-07-08 LAB — CBC WITH DIFFERENTIAL/PLATELET
Abs Immature Granulocytes: 0.01 10*3/uL (ref 0.00–0.07)
Basophils Absolute: 0.1 10*3/uL (ref 0.0–0.1)
Basophils Relative: 1 %
Eosinophils Absolute: 0 10*3/uL (ref 0.0–0.5)
Eosinophils Relative: 0 %
HCT: 35.5 % — ABNORMAL LOW (ref 36.0–46.0)
Hemoglobin: 11.5 g/dL — ABNORMAL LOW (ref 12.0–15.0)
Immature Granulocytes: 0 %
Lymphocytes Relative: 17 %
Lymphs Abs: 1.3 10*3/uL (ref 0.7–4.0)
MCH: 30.8 pg (ref 26.0–34.0)
MCHC: 32.4 g/dL (ref 30.0–36.0)
MCV: 95.2 fL (ref 80.0–100.0)
Monocytes Absolute: 0.8 10*3/uL (ref 0.1–1.0)
Monocytes Relative: 11 %
Neutro Abs: 5.5 10*3/uL (ref 1.7–7.7)
Neutrophils Relative %: 71 %
Platelets: 251 10*3/uL (ref 150–400)
RBC: 3.73 MIL/uL — ABNORMAL LOW (ref 3.87–5.11)
RDW: 12.5 % (ref 11.5–15.5)
WBC: 7.7 10*3/uL (ref 4.0–10.5)
nRBC: 0 % (ref 0.0–0.2)

## 2019-07-08 LAB — TYPE AND SCREEN
ABO/RH(D): O POS
Antibody Screen: NEGATIVE

## 2019-07-08 LAB — CBC
HCT: 34 % — ABNORMAL LOW (ref 36.0–46.0)
Hemoglobin: 11 g/dL — ABNORMAL LOW (ref 12.0–15.0)
MCH: 30.6 pg (ref 26.0–34.0)
MCHC: 32.4 g/dL (ref 30.0–36.0)
MCV: 94.7 fL (ref 80.0–100.0)
Platelets: 236 10*3/uL (ref 150–400)
RBC: 3.59 MIL/uL — ABNORMAL LOW (ref 3.87–5.11)
RDW: 12.4 % (ref 11.5–15.5)
WBC: 9.6 10*3/uL (ref 4.0–10.5)
nRBC: 0 % (ref 0.0–0.2)

## 2019-07-08 LAB — BASIC METABOLIC PANEL
Anion gap: 11 (ref 5–15)
BUN: 16 mg/dL (ref 8–23)
CO2: 24 mmol/L (ref 22–32)
Calcium: 8.9 mg/dL (ref 8.9–10.3)
Chloride: 105 mmol/L (ref 98–111)
Creatinine, Ser: 1.1 mg/dL — ABNORMAL HIGH (ref 0.44–1.00)
GFR calc Af Amer: 50 mL/min — ABNORMAL LOW (ref >60)
GFR calc non Af Amer: 43 mL/min — ABNORMAL LOW (ref >60)
Glucose, Bld: 139 mg/dL — ABNORMAL HIGH (ref 70–99)
Potassium: 3.8 mmol/L (ref 3.5–5.1)
Sodium: 140 mmol/L (ref 135–145)

## 2019-07-08 LAB — SARS CORONAVIRUS 2 (TAT 6-24 HRS): SARS Coronavirus 2: NEGATIVE

## 2019-07-08 LAB — ABO/RH: ABO/RH(D): O POS

## 2019-07-08 MED ORDER — SODIUM CHLORIDE 0.9 % IV BOLUS
500.0000 mL | Freq: Once | INTRAVENOUS | Status: AC
  Administered 2019-07-08: 500 mL via INTRAVENOUS

## 2019-07-08 MED ORDER — ACETAMINOPHEN 325 MG PO TABS: 650.0000 mg | ORAL_TABLET | Freq: Four times a day (QID) | ORAL | Status: DC | PRN

## 2019-07-08 MED ORDER — SENNOSIDES-DOCUSATE SODIUM 8.6-50 MG PO TABS: 1.0000 | ORAL_TABLET | Freq: Every evening | ORAL | Status: DC | PRN

## 2019-07-08 MED ORDER — ONDANSETRON HCL 4 MG PO TABS: 4.0000 mg | ORAL_TABLET | Freq: Four times a day (QID) | ORAL | Status: DC | PRN

## 2019-07-08 MED ORDER — ONDANSETRON HCL 4 MG/2ML IJ SOLN: 4.0000 mg | Freq: Four times a day (QID) | INTRAMUSCULAR | Status: DC | PRN

## 2019-07-08 MED ORDER — SODIUM CHLORIDE (PF) 0.9 % IJ SOLN
INTRAMUSCULAR | Status: AC
  Filled 2019-07-08: qty 50

## 2019-07-08 MED ORDER — SIMVASTATIN 10 MG PO TABS
20.0000 mg | ORAL_TABLET | Freq: Every day | ORAL | Status: DC
  Administered 2019-07-08 – 2019-07-10 (×3): 20 mg via ORAL
  Filled 2019-07-08 (×3): qty 2
  Filled 2019-07-08: qty 1

## 2019-07-08 MED ORDER — PANTOPRAZOLE SODIUM 40 MG PO TBEC
40.0000 mg | DELAYED_RELEASE_TABLET | Freq: Every day | ORAL | Status: DC
  Administered 2019-07-09 – 2019-07-11 (×2): 40 mg via ORAL
  Filled 2019-07-08 (×2): qty 1

## 2019-07-08 MED ORDER — AMLODIPINE BESYLATE 5 MG PO TABS
5.0000 mg | ORAL_TABLET | Freq: Every day | ORAL | Status: DC
  Administered 2019-07-09 – 2019-07-11 (×3): 5 mg via ORAL
  Filled 2019-07-08 (×3): qty 1

## 2019-07-08 MED ORDER — ACETAMINOPHEN 650 MG RE SUPP: 650.0000 mg | Freq: Four times a day (QID) | RECTAL | Status: DC | PRN

## 2019-07-08 MED ORDER — DOCUSATE SODIUM 100 MG PO CAPS
100.0000 mg | ORAL_CAPSULE | Freq: Two times a day (BID) | ORAL | Status: DC
  Administered 2019-07-08 – 2019-07-11 (×5): 100 mg via ORAL
  Filled 2019-07-08 (×5): qty 1

## 2019-07-08 MED ORDER — ATENOLOL 25 MG PO TABS
100.0000 mg | ORAL_TABLET | Freq: Every day | ORAL | Status: DC
  Administered 2019-07-09 – 2019-07-11 (×3): 100 mg via ORAL
  Filled 2019-07-08 (×3): qty 4

## 2019-07-08 MED ORDER — POLYVINYL ALCOHOL 1.4 % OP SOLN: 1.0000 [drp] | OPHTHALMIC | Status: DC | PRN

## 2019-07-08 MED ORDER — PRAMOXINE HCL 1 % EX CREA: 1.0000 "application " | TOPICAL_CREAM | Freq: Three times a day (TID) | CUTANEOUS | Status: DC | PRN

## 2019-07-08 MED ORDER — ACETAMINOPHEN 325 MG PO TABS: 325.0000 mg | ORAL_TABLET | Freq: Four times a day (QID) | ORAL | Status: DC | PRN

## 2019-07-08 MED ORDER — IOHEXOL 300 MG/ML  SOLN
100.0000 mL | Freq: Once | INTRAMUSCULAR | Status: AC | PRN
  Administered 2019-07-08: 11:00:00 100 mL via INTRAVENOUS

## 2019-07-08 MED ORDER — SODIUM CHLORIDE 0.9 % IV SOLN
INTRAVENOUS | Status: DC
  Administered 2019-07-08 – 2019-07-10 (×3): via INTRAVENOUS

## 2019-07-08 NOTE — ED Triage Notes (Signed)
Rectal bleeding x3 weeks; saw PCP and was dx with hemorrhoids last Wednesday. Reporting bright red blood, lightheadedness when standing. Pt on blood thinners.   BP 148/58 P 96 RR 18 98% RA  T 97.3  Denies cough, fever, SOB.

## 2019-07-08 NOTE — Consult Note (Signed)
Referring Provider:  [facility]         Primary Care Physician:  Laurey Morale, MD Primary Gastroenterologist:   Previously Dr. Sharlett Iles          Reason for Consultation:   GI bleed                ASSESSMENT /  PLAN    85 yo female with pmh significant for PAF on Eliquis, HTN, diastolic dysfunction, hyperlipidemia and GERD.   1. Acute on chronic painless rectal bleeding on Eliquis.  Longstanding history of internal / external hemorrhoids which may be source of bleeding. but can't exclude diverticular hemorrhage / neoplasm or other etiologies since bleeding has increased.  Focal area of luminal narrowing within ascending colon on CT scan, possibly a physiologic contraction.  -she seems relatively healthy for age and willing to undergo colonoscopy for further evaluation of ongoing bleeding. Keep on clear liquids.  -Hold Eliquis, last dose was this am  2. GERD, asymptomatic on PPI  3. PAF, on Eliquis at home ( hold for now)   HPI:     Christine Middleton is a 85 y.o. female with pmh as below. For years she has had intermittent, small volume, painless rectal bleeding. Known history of internal and external hemorrhoids. Bleeding recently increased,  saw PCP on 12/2.  On exam she had several small inflamed external hemorrhoids, prescribed Rx ointment but problems getting it at pharmacy. Finally got cream but hasn't yet started using it.   Presented to ED today with increased rectal bleeding. This am around 8 am she had a BM containing significant amount of bright red blood. The amount of blood was alarming to her. Denies dizziness, just feels foggy. Last Eliquis dose was this am. She takes Tylenol. Denies asa. No abdominal pain. No N/V. BMs typically normal except for the blood  Data Review:  Cr 1.10 WBC 7.7 Hgb 11.5 (baseline mid 12)  May 2019 -Flexible sigmoidoscopy for rectal bleeding (Dr. Sharlett Iles) - extent of exam to the sigmoid colon. Exam limited by tortuous and redundant  colon.    Past Medical History:  Diagnosis Date   Diverticulitis    Esophageal stricture    GERD (gastroesophageal reflux disease)    Hyperlipidemia    Hypertension    Shingles    Stroke ([place])    Vitamin B 12 deficiency     Past Surgical History:  Procedure Laterality Date   ABDOMINAL HYSTERECTOMY     APPENDECTOMY     COLONOSCOPY  12-09-08   per Dr. Sharlett Iles (incomplete along with barium enema 12-10-08) with hemorrhoids  and diverticulae   ESOPHAGOGASTRODUODENOSCOPY  09-2004   per Dr. Sharlett Iles with dilatation    EYE SURGERY     TONSILLECTOMY      Prior to Admission medications   Medication Sig Start Date End Date Taking? Authorizing Provider  acetaminophen (TYLENOL) 325 MG tablet Take 325 mg by mouth every 6 (six) hours as needed for mild pain or headache.   Yes [provider]  amLODipine (NORVASC) 5 MG tablet Take 1 tablet (5 mg total) by mouth daily. 10/02/18  Yes Laurey Morale, MD  apixaban (ELIQUIS) 2.5 MG TABS tablet Take 1 tablet (2.5 mg total) by mouth 2 (two) times daily. 09/26/18  Yes Martinique, Peter M, MD  atenolol (TENORMIN) 50 MG tablet Take 2 tablets (100 mg total) by mouth daily. 10/02/18  Yes Laurey Morale, MD  losartan (COZAAR) 100 MG tablet TAKE 1 TABLET BY  MOUTH EVERY DAY 06/04/19  Yes Laurey Morale, MD  pantoprazole (PROTONIX) 40 MG tablet Take 1 tablet (40 mg total) by mouth daily. 10/02/18  Yes Laurey Morale, MD  polyvinyl alcohol (LIQUIFILM TEARS) 1.4 % ophthalmic solution Place 1 drop into both eyes as needed for dry eyes.   Yes [provider]  simvastatin (ZOCOR) 20 MG tablet TAKE 1 TABLET (20 MG TOTAL) BY MOUTH AT BEDTIME. Patient taking differently: Take 20 mg by mouth daily at 6 PM. TAKE 1 TABLET (20 MG TOTAL) BY MOUTH AT BEDTIME. 07/15/18  Yes Laurey Morale, MD  Pramoxine HCl 1 % CREA Apply 1 application topically 3 (three) times daily. Patient taking differently: Apply 1 application topically 3 (three) times daily as  needed (hemmroids).  07/02/19   Laurey Morale, MD    Current Facility-Administered Medications  Medication Dose Route Frequency Provider Last Rate Last Dose   0.9 %  sodium chloride infusion   Intravenous Continuous Lacretia Leigh, MD 125 mL/hr at 07/08/19 1014     sodium chloride (PF) 0.9 % injection            Current Outpatient Medications  Medication Sig Dispense Refill   acetaminophen (TYLENOL) 325 MG tablet Take 325 mg by mouth every 6 (six) hours as needed for mild pain or headache.     amLODipine (NORVASC) 5 MG tablet Take 1 tablet (5 mg total) by mouth daily. 90 tablet 3   apixaban (ELIQUIS) 2.5 MG TABS tablet Take 1 tablet (2.5 mg total) by mouth 2 (two) times daily. 180 tablet 3   atenolol (TENORMIN) 50 MG tablet Take 2 tablets (100 mg total) by mouth daily. 180 tablet 3   losartan (COZAAR) 100 MG tablet TAKE 1 TABLET BY MOUTH EVERY DAY 90 tablet 3   pantoprazole (PROTONIX) 40 MG tablet Take 1 tablet (40 mg total) by mouth daily. 90 tablet 3   polyvinyl alcohol (LIQUIFILM TEARS) 1.4 % ophthalmic solution Place 1 drop into both eyes as needed for dry eyes.     simvastatin (ZOCOR) 20 MG tablet TAKE 1 TABLET (20 MG TOTAL) BY MOUTH AT BEDTIME. (Patient taking differently: Take 20 mg by mouth daily at 6 PM. TAKE 1 TABLET (20 MG TOTAL) BY MOUTH AT BEDTIME.) 90 tablet 3   Pramoxine HCl 1 % CREA Apply 1 application topically 3 (three) times daily. (Patient taking differently: Apply 1 application topically 3 (three) times daily as needed (hemmroids). ) 340 g 2    Allergies as of 07/08/2019 - Review Complete 07/08/2019  Allergen Reaction Noted   Amoxicillin  06/24/2008   Codeine  10/14/2014   Lipitor [atorvastatin]  10/18/2016   Lisinopril  03/19/2007   Nitrofurantoin  03/19/2007    Family History  Problem Relation Age of Onset   Heart attack Mother    Heart disease Father    Breast cancer Other    Diabetes Other    Hypertension Other    Stroke Other     Heart disease Other    Stroke Sister    Cancer Brother    Cancer Brother    Diabetes Brother     Social History   Socioeconomic History   Marital status: Married    Spouse name: Grayling Congress. [place]   Number of children: 4   Years of education: HS   Highest education level: Not on file  Occupational History   Occupation: Retired    Fish farm manager: RETIRED  Scientist, product/process development strain: Not  on file   Food insecurity    Worry: Not on file    Inability: Not on file   Transportation needs    Medical: Not on file    Non-medical: Not on file  Tobacco Use   Smoking status: Never Smoker   Smokeless tobacco: Never Used  Substance and Sexual Activity   Alcohol use: No    Alcohol/week: 0.0 standard drinks   Drug use: No   Sexual activity: Not on file  Lifestyle   Physical activity    Days per week: Not on file    Minutes per session: Not on file   Stress: Not on file  Relationships   Social connections    Talks on phone: Not on file    Gets together: Not on file    Attends religious service: Not on file    Active member of club or organization: Not on file    Attends meetings of clubs or organizations: Not on file    Relationship status: Not on file   Intimate partner violence    Fear of current or ex partner: Not on file    Emotionally abused: Not on file    Physically abused: Not on file    Forced sexual activity: Not on file  Other Topics Concern   Not on file  Social History Narrative   Patient lives at home with family.   Patient is right handed.   Patient has a high school education.   Caffeine Use: 1 cup daily    Review of Systems: All systems reviewed and negative except where noted in HPI.  Physical Exam: Vital signs in last 24 hours: Temp:  [97.9 F (36.6 C)] 97.9 F (36.6 C) (12/08 0950) Pulse Rate:  [66-84] 76 (12/08 1230) Resp:  [13-23] 14 (12/08 1230) BP: (123-144)/(59-77) 135/67 (12/08 1230) SpO2:  [97 %-100 %] 97 %  (12/08 1230) Weight:  [65.3 kg] 65.3 kg (12/08 0951)   General:   Alert, well-developed,  female in NAD Psych:  Pleasant, cooperative. Normal mood and affect. Eyes:  Pupils equal, sclera clear, no icterus.   Conjunctiva pink. Ears:  Normal auditory acuity. Nose:  No deformity, discharge,  or lesions. Neck:  Supple; no masses Lungs:  Clear throughout to auscultation.   No wheezes, crackles, or rhonchi.  Heart:  Regular rate and rhythm; no murmurs, no lower extremity edema Abdomen:  Soft, non-distended, nontender, BS active, no palp mass   Rectal:  Inflamed external hemorrhoids. Protruding internal hemorrhoids  - reducible  Msk:  Symmetrical without gross deformities. . Neurologic:  Alert and  oriented x4;  grossly normal neurologically. Skin:  Intact without significant lesions or rashes.   Intake/Output from previous day: No intake/output data recorded. Intake/Output this shift: Total I/O In: 500 [IV Piggyback:500] Out: -   Lab Results: Recent Labs    07/08/19 1007  WBC 7.7  HGB 11.5*  HCT 35.5*  PLT 251   BMET Recent Labs    07/08/19 1007  NA 140  K 3.8  CL 105  CO2 24  GLUCOSE 139*  BUN 16  CREATININE 1.10*  CALCIUM 8.9    . CBC Latest Ref Rng & Units 07/08/2019 10/02/2018 06/19/2018  WBC 4.0 - 10.5 K/uL 7.7 7.9 6.8  Hemoglobin 12.0 - 15.0 g/dL 11.5(L) 12.5 12.5  Hematocrit 36.0 - 46.0 % 35.5(L) 36.9 36.6  Platelets 150 - 400 K/uL 251 239.0 224.0    . CMP Latest Ref Rng & Units 07/08/2019 10/02/2018 06/19/2018  Glucose  70 - 99 mg/dL 139(H) 121(H) 127(H)  BUN 8 - 23 mg/dL 16 17 18   Creatinine 0.44 - 1.00 mg/dL 1.10(H) 0.91 0.94  Sodium 135 - 145 mmol/L 140 141 140  Potassium 3.5 - 5.1 mmol/L 3.8 4.2 3.8  Chloride 98 - 111 mmol/L 105 106 104  CO2 22 - 32 mmol/L 24 25 26   Calcium 8.9 - 10.3 mg/dL 8.9 9.3 8.9  Total Protein 6.0 - 8.3 g/dL - 7.0 6.7  Total Bilirubin 0.2 - 1.2 mg/dL - 1.0 0.5  Alkaline Phos 39 - 117 U/L - 95 119(H)  AST 0 - 37 U/L - 16 16    ALT 0 - 35 U/L - 12 12   Studies/Results: Ct Abdomen Pelvis W Contrast  Result Date: 07/08/2019 CLINICAL DATA:  Abdominal distension, rectal bleeding for 3 weeks. Lightheadedness. EXAM: CT ABDOMEN AND PELVIS WITH CONTRAST TECHNIQUE: Multidetector CT imaging of the abdomen and pelvis was performed using the standard protocol following bolus administration of intravenous contrast. CONTRAST:  146mL OMNIPAQUE IOHEXOL 300 MG/ML  SOLN COMPARISON:  None. FINDINGS: Lower chest: No acute abnormality. Hepatobiliary: Within the posterior right hepatic lobe there is a partially calcified lesion measuring 2.1 cm, image 15/2. No biliary ductal dilatation. Pancreas: Unremarkable. No pancreatic ductal dilatation or surrounding inflammatory changes. Spleen: Normal in size without focal abnormality. Adrenals/Urinary Tract: Normal appearance of the adrenal glands. The right kidney is unremarkable. Scarring and mild volume loss from the left kidney noted. The urinary bladder appears unremarkable. Stomach/Bowel: Stomach and the small bowel loops appear within normal limits. No bowel wall thickening, inflammation or distension. No obstructing colon mass. Gas and stool noted throughout the colon. Focal area of luminal narrowing within the ascending colon is nonspecific and may represent an area of normal physiologic contraction. Vascular/Lymphatic: Aortic atherosclerosis. No enlarged abdominal or pelvic lymph nodes. Reproductive: Status post hysterectomy. No adnexal masses. Other: No ascites or focal fluid collections. Musculoskeletal: No acute or significant osseous findings. IMPRESSION: 1. No acute findings identified within the abdomen or pelvis. 2. There is a partially calcified lesion within the posterior right hepatic lobe. This is favored to represent a benign abnormality such as a hemangioma. 3. Aortic atherosclerosis. 4. Focal area of luminal narrowing within the ascending colon is nonspecific and may represent an area of  normal physiologic contraction. In a patient who presents with correlation with colon cancer screening is advised. Aortic Atherosclerosis (ICD10-I70.0). Electronically Signed   By: Kerby Moors M.D.   On: 07/08/2019 12:08    Active Problems:   * No active hospital problems. Tye Savoy, NP-C @  07/08/2019, 2:20 PM

## 2019-07-08 NOTE — ED Notes (Signed)
ED TO INPATIENT HANDOFF REPORT  Name/Age/Gender Christine Middleton 85 y.o. female  Code Status    Code Status Orders  (From admission, onward)         Start     Ordered   07/08/19 2101  Do not attempt resuscitation (DNR)  Continuous    Question Answer Comment  In the event of cardiac or respiratory ARREST Do not call a "code blue"   In the event of cardiac or respiratory ARREST Do not perform Intubation, CPR, defibrillation or ACLS   In the event of cardiac or respiratory ARREST Use medication by any route, position, wound care, and other measures to relive pain and suffering. May use oxygen, suction and manual treatment of airway obstruction as needed for comfort.      07/08/19 2100        Code Status History    Date Active Date Inactive Code Status Order ID Comments User Context   08/20/2016 0938 08/22/2016 2016 DNR [id number]  Altha Harm, RN Inpatient   08/18/2016 1130 08/20/2016 0938 Full Code [id number]  Elwin Mocha, MD ED   Advance Care Planning Activity      Home/SNF/Other Home  Chief Complaint rectal bleeding  Level of Care/Admitting Diagnosis ED Disposition    ED Disposition Condition Comment   Admit  Hospital Area: [place] [[id number]]  Level of Care: Med-Surg [16]  Covid Evaluation: Asymptomatic Screening Protocol (No Symptoms)  Diagnosis: Rectal bleeding [id number]  Admitting Physician: Truddie Hidden [id number]:[id number]  Attending Physician: Truddie Hidden [id number]  Estimated length of stay: 3 - 4 days  Certification:: I certify this patient will need inpatient services for at least 2 midnights  PT Class (Do Not Modify): Inpatient [101]  PT Acc Code (Do Not Modify): Private [1]       Medical History Past Medical History:  Diagnosis Date  . Diverticulitis   . Esophageal stricture   . GERD (gastroesophageal reflux disease)   . Hyperlipidemia   . Hypertension   . Shingles   . Stroke ([place])   . Vitamin B 12 deficiency      Allergies Allergies  Allergen Reactions  . Amoxicillin     REACTION: rash  . Codeine   . Lipitor [Atorvastatin]     Muscle pain, stiff joints  . Lisinopril   . Nitrofurantoin     IV Location/Drains/Wounds Patient Lines/Drains/Airways Status   Active Line/Drains/Airways    Name:   Placement date:   Placement time:   Site:   Days:   Peripheral IV 07/08/19 Left Antecubital   07/08/19    1009    Antecubital   less than 1   External Urinary Catheter   07/08/19    1810    -   less than 1          Labs/Imaging Results for orders placed or performed during the hospital encounter of 07/08/19 (from the past 48 hour(s))  CBC with Differential     Status: Abnormal   Collection Time: 07/08/19 10:07 AM  Result Value Ref Range   WBC 7.7 4.0 - 10.5 K/uL   RBC 3.73 (L) 3.87 - 5.11 MIL/uL   Hemoglobin 11.5 (L) 12.0 - 15.0 g/dL   HCT 35.5 (L) 36.0 - 46.0 %   MCV 95.2 80.0 - 100.0 fL   MCH 30.8 26.0 - 34.0 pg   MCHC 32.4 30.0 - 36.0 g/dL   RDW 12.5 11.5 - 15.5 %   Platelets 251 150 - 400 K/uL  nRBC 0.0 0.0 - 0.2 %   Neutrophils Relative % 71 %   Neutro Abs 5.5 1.7 - 7.7 K/uL   Lymphocytes Relative 17 %   Lymphs Abs 1.3 0.7 - 4.0 K/uL   Monocytes Relative 11 %   Monocytes Absolute 0.8 0.1 - 1.0 K/uL   Eosinophils Relative 0 %   Eosinophils Absolute 0.0 0.0 - 0.5 K/uL   Basophils Relative 1 %   Basophils Absolute 0.1 0.0 - 0.1 K/uL   Immature Granulocytes 0 %   Abs Immature Granulocytes 0.01 0.00 - 0.07 K/uL    Comment: Performed at [facility], [place] [address] [place], [place] [address]  BMET     Status: Abnormal   Collection Time: 07/08/19 10:07 AM  Result Value Ref Range   Sodium 140 135 - 145 mmol/L   Potassium 3.8 3.5 - 5.1 mmol/L   Chloride 105 98 - 111 mmol/L   CO2 24 22 - 32 mmol/L   Glucose, Bld 139 (H) 70 - 99 mg/dL   BUN 16 8 - 23 mg/dL   Creatinine, Ser 1.10 (H) 0.44 - 1.00 mg/dL   Calcium 8.9 8.9 - 10.3 mg/dL   GFR calc non Af Amer 43  (L) >60 mL/min   GFR calc Af Amer 50 (L) >60 mL/min   Anion gap 11 5 - 15    Comment: Performed at [facility], [place] [address] [place], [place] [address]  Type and screen     Status: None   Collection Time: 07/08/19 10:07 AM  Result Value Ref Range   ABO/RH(D) O POS    Antibody Screen NEG    Sample Expiration      07/11/2019,2359 Performed at [facility], [place] [address] [place], [place] [address]   ABO/Rh     Status: None   Collection Time: 07/08/19 10:07 AM  Result Value Ref Range   ABO/RH(D)      Jenetta Downer POS Performed at [facility], [place] [address], [place], [place] [address]   SARS CORONAVIRUS 2 (TAT 6-24 HRS) Nasopharyngeal Nasopharyngeal Swab     Status: None   Collection Time: 07/08/19  1:17 PM   Specimen: Nasopharyngeal Swab  Result Value Ref Range   SARS Coronavirus 2 NEGATIVE NEGATIVE    Comment: (NOTE) SARS-CoV-2 target nucleic acids are NOT DETECTED. The SARS-CoV-2 RNA is generally detectable in upper and lower respiratory specimens during the acute phase of infection. Negative results do not preclude SARS-CoV-2 infection, do not rule out co-infections with other pathogens, and should not be used as the sole basis for treatment or other patient management decisions. Negative results must be combined with clinical observations, patient history, and epidemiological information. The expected result is Negative. Fact Sheet for Patients: SugarRoll.be Fact Sheet for Healthcare Providers: https://www.woods-mathews.com/ This test is not yet approved or cleared by the [place] FDA and  has been authorized for detection and/or diagnosis of SARS-CoV-2 by FDA under an Emergency Use Authorization (EUA). This EUA will remain  in effect (meaning this test can be used) for the duration of the COVID-19 declaration under Section 56 4(b)(1) of the Act, 21 U.S.C. section  360bbb-3(b)(1), unless the authorization is terminated or revoked sooner. Performed at [facility], [place] [address] [place], [place] [address]    Ct Abdomen Pelvis W Contrast  Result Date: 07/08/2019 CLINICAL DATA:  Abdominal distension, rectal bleeding for 3 weeks. Lightheadedness. EXAM: CT ABDOMEN AND PELVIS WITH CONTRAST TECHNIQUE: Multidetector  CT imaging of the abdomen and pelvis was performed using the standard protocol following bolus administration of intravenous contrast. CONTRAST:  130mL OMNIPAQUE IOHEXOL 300 MG/ML  SOLN COMPARISON:  None. FINDINGS: Lower chest: No acute abnormality. Hepatobiliary: Within the posterior right hepatic lobe there is a partially calcified lesion measuring 2.1 cm, image 15/2. No biliary ductal dilatation. Pancreas: Unremarkable. No pancreatic ductal dilatation or surrounding inflammatory changes. Spleen: Normal in size without focal abnormality. Adrenals/Urinary Tract: Normal appearance of the adrenal glands. The right kidney is unremarkable. Scarring and mild volume loss from the left kidney noted. The urinary bladder appears unremarkable. Stomach/Bowel: Stomach and the small bowel loops appear within normal limits. No bowel wall thickening, inflammation or distension. No obstructing colon mass. Gas and stool noted throughout the colon. Focal area of luminal narrowing within the ascending colon is nonspecific and may represent an area of normal physiologic contraction. Vascular/Lymphatic: Aortic atherosclerosis. No enlarged abdominal or pelvic lymph nodes. Reproductive: Status post hysterectomy. No adnexal masses. Other: No ascites or focal fluid collections. Musculoskeletal: No acute or significant osseous findings. IMPRESSION: 1. No acute findings identified within the abdomen or pelvis. 2. There is a partially calcified lesion within the posterior right hepatic lobe. This is favored to represent a benign abnormality such as a hemangioma. 3. Aortic  atherosclerosis. 4. Focal area of luminal narrowing within the ascending colon is nonspecific and may represent an area of normal physiologic contraction. In a patient who presents with correlation with colon cancer screening is advised. Aortic Atherosclerosis (ICD10-I70.0). Electronically Signed   By: Kerby Moors M.D.   On: 07/08/2019 12:08    Pending Labs Unresulted Labs (From admission, onward)    Start     Ordered   07/09/19 0500  CBC Once  Tomorrow morning,   R     07/08/19 1528   07/09/19 [id number]  Basic metabolic panel  Tomorrow morning,   R     07/08/19 2100   07/08/19 2200  CBC  Once,   STAT     07/08/19 2100          Vitals/Pain Today's Vitals   07/08/19 2000 07/08/19 2030 07/08/19 2102 07/08/19 2114  BP: (!) 147/69 135/72 (!) 151/90   Pulse: 73 69 76   Resp: (!) 23 18 16    Temp:   97.8 F (36.6 C)   TempSrc:      SpO2: 98% 97% 98%   Weight:      Height:      PainSc:    0-No pain    Isolation Precautions No active isolations  Medications Medications  0.9 %  sodium chloride infusion ( Intravenous Handoff 07/08/19 2059)  sodium chloride (PF) 0.9 % injection (  Canceled Entry 07/08/19 1307)  amLODipine (NORVASC) tablet 5 mg (has no administration in time range)  atenolol (TENORMIN) tablet 100 mg (has no administration in time range)  simvastatin (ZOCOR) tablet 20 mg (has no administration in time range)  pantoprazole (PROTONIX) EC tablet 40 mg (has no administration in time range)  polyvinyl alcohol (LIQUIFILM TEARS) 1.4 % ophthalmic solution 1 drop (has no administration in time range)  acetaminophen (TYLENOL) tablet 650 mg (has no administration in time range)    Or  acetaminophen (TYLENOL) suppository 650 mg (has no administration in time range)  docusate sodium (COLACE) capsule 100 mg (has no administration in time range)  senna-docusate (Senokot-S) tablet 1 tablet (has no administration in time range)  ondansetron (ZOFRAN) tablet 4 mg (has no administration  in time  range)    Or  ondansetron (ZOFRAN) injection 4 mg (has no administration in time range)  sodium chloride 0.9 % bolus 500 mL (0 mLs Intravenous Stopped 07/08/19 1303)  iohexol (OMNIPAQUE) 300 MG/ML solution 100 mL (100 mLs Intravenous Contrast Given 07/08/19 1123)    Mobility walks

## 2019-07-08 NOTE — ED Notes (Addendum)
Christine Middleton- POA would like an update whenever possible- [phone].

## 2019-07-08 NOTE — ED Notes (Signed)
The patients daughter, Lawana Chambers, said she is POA, and wants to speak with the doctor. [phone].

## 2019-07-08 NOTE — H&P (Signed)
History and Physical    Christine Middleton [id number] DOB: [birth year] DOA: 07/08/2019  PCP: Laurey Morale, MD  Patient coming from: Home  I have personally briefly reviewed patient's old medical records in [place]  Chief Complaint: Rectal Bleeding  HPI: Christine Middleton is a 85 y.o. female with medical history significant of HTN, HLD, GERD, CVA  And Paroxysmal Atrial Fibrillation on apixaban who presents with 1-2 weeks of BRBPR.  Patient reports she has otherwise been in her USOH but has notice recurrence of BRBPR.  She states she had bleeding several years ago but this past week or two she has noticed every morning while she wipes in the bathroom she has bright red blood.  She denies any black stools.  She does report being on apixaban for A Fib.  She states this past week and particularly today the volume of blood was too much and she thought it was higher up and not simply a hemorrhoid.  She had been seen by her primary last week and reports she was prescribed pramoxine cream but had not had a change to use it as she only was able to pick it up last night.  She denies any fevers, chills, unexpected weight loss.  She denies any lightheadedness or dizziness.  She reports she has been able to largely live the same.  She feels she may be in a fog but hard to say for sure at her age.  She lives with her son at home, reports largely independent, independent in ambulation as well.  She denies tobacco, alcohol or drugs but reports she would like to curse.  She lost her husband 2.5 years prior.  Review of Systems: As per HPI otherwise 10 point review of systems negative.    Past Medical History:  Diagnosis Date   Diverticulitis    Esophageal stricture    GERD (gastroesophageal reflux disease)    Hyperlipidemia    Hypertension    Shingles    Stroke ([place])    Vitamin B 12 deficiency     Past Surgical History:  Procedure Laterality Date   ABDOMINAL HYSTERECTOMY      APPENDECTOMY     COLONOSCOPY  12-09-08   per Dr. Sharlett Iles (incomplete along with barium enema 12-10-08) with hemorrhoids  and diverticulae   ESOPHAGOGASTRODUODENOSCOPY  09-2004   per Dr. Sharlett Iles with dilatation    EYE SURGERY     TONSILLECTOMY       reports that she has never smoked. She has never used smokeless tobacco. She reports that she does not drink alcohol or use drugs.  Allergies  Allergen Reactions   Amoxicillin     REACTION: rash   Codeine    Lipitor [Atorvastatin]     Muscle pain, stiff joints   Lisinopril    Nitrofurantoin     Family History  Problem Relation Age of Onset   Heart attack Mother    Heart disease Father    Breast cancer Other    Diabetes Other    Hypertension Other    Stroke Other    Heart disease Other    Stroke Sister    Cancer Brother    Cancer Brother    Diabetes Brother      Prior to Admission medications   Medication Sig Start Date End Date Taking? Authorizing Provider  acetaminophen (TYLENOL) 325 MG tablet Take 325 mg by mouth every 6 (six) hours as needed for mild pain or headache.   Yes [provider]  amLODipine (NORVASC) 5 MG tablet Take 1 tablet (5 mg total) by mouth daily. 10/02/18  Yes Laurey Morale, MD  apixaban (ELIQUIS) 2.5 MG TABS tablet Take 1 tablet (2.5 mg total) by mouth 2 (two) times daily. 09/26/18  Yes Martinique, Peter M, MD  atenolol (TENORMIN) 50 MG tablet Take 2 tablets (100 mg total) by mouth daily. 10/02/18  Yes Laurey Morale, MD  losartan (COZAAR) 100 MG tablet TAKE 1 TABLET BY MOUTH EVERY DAY 06/04/19  Yes Laurey Morale, MD  pantoprazole (PROTONIX) 40 MG tablet Take 1 tablet (40 mg total) by mouth daily. 10/02/18  Yes Laurey Morale, MD  polyvinyl alcohol (LIQUIFILM TEARS) 1.4 % ophthalmic solution Place 1 drop into both eyes as needed for dry eyes.   Yes [provider]  simvastatin (ZOCOR) 20 MG tablet TAKE 1 TABLET (20 MG TOTAL) BY MOUTH AT BEDTIME. Patient taking differently:  Take 20 mg by mouth daily at 6 PM. TAKE 1 TABLET (20 MG TOTAL) BY MOUTH AT BEDTIME. 07/15/18  Yes Laurey Morale, MD  Pramoxine HCl 1 % CREA Apply 1 application topically 3 (three) times daily. Patient taking differently: Apply 1 application topically 3 (three) times daily as needed (hemmroids).  07/02/19   Laurey Morale, MD    Physical Exam: Vitals:   07/08/19 1100 07/08/19 1200 07/08/19 1230 07/08/19 1430  BP: 123/61 138/65 135/67 138/68  Pulse: 66 78 76 72  Resp: 17 (!) 23 14 15   Temp:      TempSrc:      SpO2: 97% 100% 97% 97%  Weight:      Height:         Vitals:   07/08/19 1100 07/08/19 1200 07/08/19 1230 07/08/19 1430  BP: 123/61 138/65 135/67 138/68  Pulse: 66 78 76 72  Resp: 17 (!) 23 14 15   Temp:      TempSrc:      SpO2: 97% 100% 97% 97%  Weight:      Height:       Constitutional: NAD, calm, comfortable; cognitively younger than stated age Eyes: PERRL, lids and conjunctivae normal ENMT: Mucous membranes are moist. Posterior pharynx clear of any exudate or lesions.Normal dentition.  Neck: normal, supple, no masses, no thyromegaly Respiratory: clear to auscultation bilaterally, no wheezing, no crackles. Normal respiratory effort. No accessory muscle use.  Cardiovascular: Regular rate and rhythm, no murmurs / rubs / gallops. No extremity edema. 2+ pedal pulses. No carotid bruits.  Abdomen: no tenderness, no masses palpated. No hepatosplenomegaly. Bowel sounds positive.  Musculoskeletal: no clubbing / cyanosis. No joint deformity upper and lower extremities. Good ROM, no contractures. Normal muscle tone.  Skin: no rashes, lesions, ulcers. No induration Neurologic: CN 2-12 grossly intact. Sensation and strength grossly non-focal Psychiatric: Normal judgment and insight. Alert and oriented x 3. Normal mood.     Labs on Admission: I have personally reviewed following labs and imaging studies  CBC: Recent Labs  Lab 07/08/19 1007  WBC 7.7  NEUTROABS 5.5  HGB 11.5*    HCT 35.5*  MCV 95.2  PLT 123XX123   Basic Metabolic Panel: Recent Labs  Lab 07/08/19 1007  NA 140  K 3.8  CL 105  CO2 24  GLUCOSE 139*  BUN 16  CREATININE 1.10*  CALCIUM 8.9   GFR: Estimated Creatinine Clearance: 27.7 mL/min (A) (by C-G formula based on SCr of 1.1 mg/dL (H)). Liver Function Tests: No results for input(s): AST, ALT, ALKPHOS, BILITOT, PROT, ALBUMIN in  the last 168 hours. No results for input(s): LIPASE, AMYLASE in the last 168 hours. No results for input(s): AMMONIA in the last 168 hours. Coagulation Profile: No results for input(s): INR, PROTIME in the last 168 hours. Cardiac Enzymes: No results for input(s): CKTOTAL, CKMB, CKMBINDEX, TROPONINI in the last 168 hours. BNP (last 3 results) No results for input(s): PROBNP in the last 8760 hours. HbA1C: No results for input(s): HGBA1C in the last 72 hours. CBG: No results for input(s): GLUCAP in the last 168 hours. Lipid Profile: No results for input(s): CHOL, HDL, LDLCALC, TRIG, CHOLHDL, LDLDIRECT in the last 72 hours. Thyroid Function Tests: No results for input(s): TSH, T4TOTAL, FREET4, T3FREE, THYROIDAB in the last 72 hours. Anemia Panel: No results for input(s): VITAMINB12, FOLATE, FERRITIN, TIBC, IRON, RETICCTPCT in the last 72 hours. Urine analysis:    Component Value Date/Time   COLORURINE YELLOW 08/20/2016 0424   APPEARANCEUR CLEAR 08/20/2016 0424   LABSPEC 1.016 08/20/2016 0424   PHURINE 5.0 08/20/2016 0424   GLUCOSEU NEGATIVE 08/20/2016 0424   HGBUR NEGATIVE 08/20/2016 0424   HGBUR negative 08/31/2009 0927   BILIRUBINUR neg 10/02/2018 1058   KETONESUR NEGATIVE 08/20/2016 0424   PROTEINUR Negative 10/02/2018 1058   PROTEINUR NEGATIVE 08/20/2016 0424   UROBILINOGEN 0.2 10/02/2018 1058   UROBILINOGEN 0.2 10/04/2013 2030   NITRITE neg 10/02/2018 1058   NITRITE NEGATIVE 08/20/2016 0424   LEUKOCYTESUR Negative 10/02/2018 1058    Radiological Exams on Admission: Ct Abdomen Pelvis W  Contrast  Result Date: 07/08/2019 CLINICAL DATA:  Abdominal distension, rectal bleeding for 3 weeks. Lightheadedness. EXAM: CT ABDOMEN AND PELVIS WITH CONTRAST TECHNIQUE: Multidetector CT imaging of the abdomen and pelvis was performed using the standard protocol following bolus administration of intravenous contrast. CONTRAST:  126mL OMNIPAQUE IOHEXOL 300 MG/ML  SOLN COMPARISON:  None. FINDINGS: Lower chest: No acute abnormality. Hepatobiliary: Within the posterior right hepatic lobe there is a partially calcified lesion measuring 2.1 cm, image 15/2. No biliary ductal dilatation. Pancreas: Unremarkable. No pancreatic ductal dilatation or surrounding inflammatory changes. Spleen: Normal in size without focal abnormality. Adrenals/Urinary Tract: Normal appearance of the adrenal glands. The right kidney is unremarkable. Scarring and mild volume loss from the left kidney noted. The urinary bladder appears unremarkable. Stomach/Bowel: Stomach and the small bowel loops appear within normal limits. No bowel wall thickening, inflammation or distension. No obstructing colon mass. Gas and stool noted throughout the colon. Focal area of luminal narrowing within the ascending colon is nonspecific and may represent an area of normal physiologic contraction. Vascular/Lymphatic: Aortic atherosclerosis. No enlarged abdominal or pelvic lymph nodes. Reproductive: Status post hysterectomy. No adnexal masses. Other: No ascites or focal fluid collections. Musculoskeletal: No acute or significant osseous findings. IMPRESSION: 1. No acute findings identified within the abdomen or pelvis. 2. There is a partially calcified lesion within the posterior right hepatic lobe. This is favored to represent a benign abnormality such as a hemangioma. 3. Aortic atherosclerosis. 4. Focal area of luminal narrowing within the ascending colon is nonspecific and may represent an area of normal physiologic contraction. In a patient who presents with  correlation with colon cancer screening is advised. Aortic Atherosclerosis (ICD10-I70.0). Electronically Signed   By: Kerby Moors M.D.   On: 07/08/2019 12:08    EKG: Independently reviewed.   Assessment/Plan Greenleigh B Heimann is a 85 y.o. female with medical history significant of HTN, HLD, GERD, CVA  And Paroxysmal Atrial Fibrillation on apixaban who presents with 1-2 weeks of BRBPR  # BRBPR/ LGIB -  possible hemorrhoids vs diverticular vs mass. CT reveals a focal area of luminal narrowing in ascending colon - patient on apixaban, hx of hemorrhoids, anticipate colonoscopy in AM with [facility] - consulted by Dr. Zenia Resides (ER).  Patient consented and aware of plan - continue CLD tonight, NPO at midnight, held apixaban  # Paroxysmal Atrial Fibrillation # Hx of multifocal CVA - patient with hx of CVA, previously on plavix and then placed on Apixaban with Atenolol once documented A. Fib - will hold Apixaban in setting of lower GI bleed as above - continue atenolol, simvastatin  # HTN - continue atenolol, held losartan  # HLD - continue simvastatin  # GERD - continue pantoprazole  DVT prophylaxis: SCDs Code Status: DNR/DNI Family Communication: Spoke with daughter [name], she seemed anxious/upset that her mother had brought herself in via ambulance without notifying any other family members Disposition Plan: pending Consults called: [facility] Admission status: medsurg   Truddie Hidden MD [facility] Pager [phone]  If 7PM-7AM, please contact night-coverage [email or web address] Password [email or web address]  07/08/2019, 4:21 PM

## 2019-07-08 NOTE — ED Provider Notes (Signed)
[facility] DEPT Provider Note   CSN: [id number] Arrival date & time: 07/08/19  0941     History   Chief Complaint Chief Complaint  Patient presents with  . Rectal Bleeding    HPI Christine Middleton is a 85 y.o. female.     85 year old female presents with rectal bleeding times several days.  Denies any fever or abdominal discomfort.  Recently became dizzy and lightheaded when she stands up.  She does take Eliquis.  Seen by her doctor for similar symptoms according to her and felt it was from hemorrhoids.  Patient states that she passes red blood per rectum at times which is been painless.  Does have a prior history of diverticulitis.  No treatment use prior to arrival     Past Medical History:  Diagnosis Date  . Diverticulitis   . Esophageal stricture   . GERD (gastroesophageal reflux disease)   . Hyperlipidemia   . Hypertension   . Shingles   . Stroke ([place])   . Vitamin B 12 deficiency     Patient Active Problem List   Diagnosis Date Noted  . Depression with anxiety 09/26/2017  . Hypertension 01/17/2017  . Pedal edema 10/18/2016  . TMJ (dislocation of temporomandibular joint) 09/18/2016  . 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  . Cerebrovascular accident (CVA) ([place])   . Embolic stroke ([place]) [id number]  . Facial droop 08/18/2016  . Cerebral infarction ([place]) 10/04/2013  . HYPERGLYCEMIA 06/30/2010  . CEREBROVASCULAR ACCIDENT 07/19/2009  . Disorder resulting from impaired renal function 07/19/2009  . Polymyalgia rheumatica ([place]) 07/19/2009  . VITAMIN B12 DEFICIENCY 06/30/2009  . Anxiety state 06/28/2009  . Hereditary and idiopathic peripheral neuropathy 06/28/2009  . External hemorrhoids 12/03/2008  . ESOPHAGEAL STRICTURE 11/30/2008  . DIVERTICULOSIS, COLON 11/30/2008  . GROSS HEMATURIA 11/04/2008  . MYALGIA 11/04/2008  . Constipation 06/24/2008  . ACUTE CYSTITIS 03/12/2008  . HLD (hyperlipidemia) 05/30/2007  . SKIN LESION 05/30/2007  .  Essential hypertension 03/19/2007  . GERD 03/19/2007    Past Surgical History:  Procedure Laterality Date  . ABDOMINAL HYSTERECTOMY    . APPENDECTOMY    . COLONOSCOPY  12-09-08   per Dr. Sharlett Iles (incomplete along with barium enema 12-10-08) with hemorrhoids  and diverticulae  . ESOPHAGOGASTRODUODENOSCOPY  09-2004   per Dr. Sharlett Iles with dilatation   . EYE SURGERY    . TONSILLECTOMY       OB History   No obstetric history on file.      Home Medications    Prior to Admission medications   Medication Sig Start Date End Date Taking? Authorizing Provider  acetaminophen (TYLENOL) 325 MG tablet Take 650 mg by mouth every 6 (six) hours as needed.    [provider]  amLODipine (NORVASC) 5 MG tablet Take 1 tablet (5 mg total) by mouth daily. 10/02/18   Laurey Morale, MD  apixaban (ELIQUIS) 2.5 MG TABS tablet Take 1 tablet (2.5 mg total) by mouth 2 (two) times daily. 09/26/18   Martin[place], Peter M, MD  atenolol (TENORMIN) 50 MG tablet Take 2 tablets (100 mg total) by mouth daily. 10/02/18   Laurey Morale, MD  losartan (COZAAR) 100 MG tablet TAKE 1 TABLET BY MOUTH EVERY DAY 06/04/19   Laurey Morale, MD  pantoprazole (PROTONIX) 40 MG tablet Take 1 tablet (40 mg total) by mouth daily. 10/02/18   Laurey Morale, MD  Pramoxine HCl 1 % CREA Apply 1 application topically 3 (three) times daily. 07/02/19   [name]  A, MD  simvastatin (ZOCOR) 20 MG tablet TAKE 1 TABLET (20 MG TOTAL) BY MOUTH AT BEDTIME. 07/15/18   Laurey Morale, MD    Family History Family History  Problem Relation Age of Onset  . Heart attack Mother   . Heart disease Father   . Breast cancer Other   . Diabetes Other   . Hypertension Other   . Stroke Other   . Heart disease Other   . Stroke Sister   . Cancer Brother   . Cancer Brother   . Diabetes Brother     Social History Social History   Tobacco Use  . Smoking status: Never Smoker  . Smokeless tobacco: Never Used  Substance Use Topics  . Alcohol use: No     Alcohol/week: 0.0 standard drinks  . Drug use: No     Allergies   Amoxicillin, Codeine, Lipitor [atorvastatin], Lisinopril, and Nitrofurantoin   Review of Systems Review of Systems  All other systems reviewed and are negative.    Physical Exam Updated Vital Signs BP (!) 144/77 (BP Location: Right Arm)   Pulse 84   Temp 97.9 F (36.6 C) (Oral)   Resp 18   Ht 1.575 m (5\' 2" )   Wt 65.3 kg   SpO2 99%   BMI 26.34 kg/m   Physical Exam Vitals signs and nursing note reviewed.  Constitutional:      General: She is not in acute distress.    Appearance: Normal appearance. She is well-developed. She is not toxic-appearing.  HENT:     Head: Normocephalic and atraumatic.  Eyes:     General: Lids are normal.     Conjunctiva/sclera: Conjunctivae normal.     Pupils: Pupils are equal, round, and reactive to light.  Neck:     Musculoskeletal: Normal range of motion and neck supple.     Thyroid: No thyroid mass.     Trachea: No tracheal deviation.  Cardiovascular:     Rate and Rhythm: Normal rate and regular rhythm.     Heart sounds: Normal heart sounds. No murmur. No gallop.   Pulmonary:     Effort: Pulmonary effort is normal. No respiratory distress.     Breath sounds: Normal breath sounds. No stridor. No decreased breath sounds, wheezing, rhonchi or rales.  Abdominal:     General: Bowel sounds are normal. There is no distension.     Palpations: Abdomen is soft.     Tenderness: There is no abdominal tenderness. There is no rebound.  Genitourinary:    Rectum: External hemorrhoid present.     Comments: Gross blood noted per digital exam Musculoskeletal: Normal range of motion.        General: No tenderness.  Skin:    General: Skin is warm and dry.     Findings: No abrasion or rash.  Neurological:     Mental Status: She is alert and oriented to person, place, and time.     GCS: GCS eye subscore is 4. GCS verbal subscore is 5. GCS motor subscore is 6.     Cranial Nerves:  No cranial nerve deficit.     Sensory: No sensory deficit.  Psychiatric:        Speech: Speech normal.        Behavior: Behavior normal.      ED Treatments / Results  Labs (all labs ordered are listed, but only abnormal results are displayed) Labs Reviewed  CBC WITH DIFFERENTIAL/PLATELET  BASIC METABOLIC PANEL  TYPE AND SCREEN  EKG None  Radiology No results found.  Procedures Procedures (including critical care time)  Medications Ordered in ED Medications  0.9 %  sodium chloride infusion (has no administration in time range)     Initial Impression / Assessment and Plan / ED Course  I have reviewed the triage vital signs and the nursing notes.  Pertinent labs & imaging results that were available during my care of the patient were reviewed by me and considered in my medical decision making (see chart for details).        Patient's hemoglobin is stable here.  Had abdominal CT did not show any evidence of diverticulosis.  Patient having significant blood per rectum at this time.  Discussed with GI will come and see the patient.  Will consult hospitalist for admission  Final Clinical Impressions(s) / ED Diagnoses   Final diagnoses:  None    ED Discharge Orders    None       Lacretia Leigh, MD 07/08/19 1302

## 2019-07-09 LAB — BASIC METABOLIC PANEL
Anion gap: 8 (ref 5–15)
BUN: 8 mg/dL (ref 8–23)
CO2: 24 mmol/L (ref 22–32)
Calcium: 8.6 mg/dL — ABNORMAL LOW (ref 8.9–10.3)
Chloride: 110 mmol/L (ref 98–111)
Creatinine, Ser: 0.83 mg/dL (ref 0.44–1.00)
GFR calc Af Amer: >60 mL/min (ref >60)
GFR calc non Af Amer: >60 mL/min (ref >60)
Glucose, Bld: 102 mg/dL — ABNORMAL HIGH (ref 70–99)
Potassium: 3.8 mmol/L (ref 3.5–5.1)
Sodium: 142 mmol/L (ref 135–145)

## 2019-07-09 LAB — CBC
HCT: 32.5 % — ABNORMAL LOW (ref 36.0–46.0)
Hemoglobin: 10.5 g/dL — ABNORMAL LOW (ref 12.0–15.0)
MCH: 30.4 pg (ref 26.0–34.0)
MCHC: 32.3 g/dL (ref 30.0–36.0)
MCV: 94.2 fL (ref 80.0–100.0)
Platelets: 232 10*3/uL (ref 150–400)
RBC: 3.45 MIL/uL — ABNORMAL LOW (ref 3.87–5.11)
RDW: 12.5 % (ref 11.5–15.5)
WBC: 8.3 10*3/uL (ref 4.0–10.5)
nRBC: 0 % (ref 0.0–0.2)

## 2019-07-09 MED ORDER — PEG-KCL-NACL-NASULF-NA ASC-C 100 G PO SOLR: 1.0000 | Freq: Once | ORAL | Status: DC

## 2019-07-09 MED ORDER — PEG-KCL-NACL-NASULF-NA ASC-C 100 G PO SOLR
0.5000 | Freq: Once | ORAL | Status: AC
  Administered 2019-07-10: 100 g via ORAL

## 2019-07-09 MED ORDER — PEG-KCL-NACL-NASULF-NA ASC-C 100 G PO SOLR
0.5000 | Freq: Once | ORAL | Status: AC
  Administered 2019-07-09: 100 g via ORAL
  Filled 2019-07-09: qty 1

## 2019-07-09 NOTE — Progress Notes (Signed)
Progress Note    ASSESSMENT AND PLAN:    1. Acute on chronic painless rectal bleeding on Eliquis. Bleeding probably hemorrhoidal but should exclude other etiologies such as diverticular hemorrhage or polyp / neoplasm. Focal narrowing in colon on CT scan- probably non-specific. No bleeding today and sounds like none since admission.  -Colonoscopy tomorrow. The risks and benefits of colonoscopy with possible polypectomy / biopsies were discussed and the patient agrees to proceed.   2. Mild [place] anemia secondary to #1. Hgb of 10.5, baseline around 12.5.  -continue to monitor. Suspect hgb will stabilize.   3. PAF, home Eliquis is on hold     SUBJECTIVE       OBJECTIVE:     Vital signs in last 24 hours: Temp:  [97.8 F (36.6 C)-98.6 F (37 C)] 98.6 F (37 C) (12/09 0907) Pulse Rate:  [66-88] 69 (12/09 0907) Resp:  [13-23] 18 (12/09 0907) BP: (114-157)/(59-90) 129/72 (12/09 0907) SpO2:  [95 %-100 %] 96 % (12/09 0907) Weight:  [64.4 kg-65.3 kg] 64.4 kg (12/09 0719) Last BM Date: 07/08/19 General:   Alert, well-developed female in NAD EENT:  Normal hearing, non icteric sclera   Heart:  Regular rate, irreg rhythm;  No lower extremity edema   Pulm: Normal respiratory effort   Abdomen:  Soft, nondistended, nontender.  Normal bowel sounds.          Neurologic:  Alert and  oriented x4;  grossly normal neurologically. Psych:  Pleasant, cooperative.  Normal mood and affect.   Intake/Output from previous day: 12/08 0701 - 12/09 0700 In: 2585.3 [I.V.:2085.3; IV Piggyback:500] Out: [id number] Intake/Output this shift: No intake/output data recorded.  Lab Results: Recent Labs    07/08/19 1007 07/08/19 2143 07/09/19 0605  WBC 7.7 9.6 8.3  HGB 11.5* 11.0* 10.5*  HCT 35.5* 34.0* 32.5*  PLT 251 236 232   BMET Recent Labs    07/08/19 1007 07/09/19 0605  NA 140 142  K 3.8 3.8  CL 105 110  CO2 24 24  GLUCOSE 139* 102*  BUN 16 8  CREATININE 1.10* 0.83    CALCIUM 8.9 8.6*    Ct Abdomen Pelvis W Contrast  Result Date: 07/08/2019 CLINICAL DATA:  Abdominal distension, rectal bleeding for 3 weeks. Lightheadedness. EXAM: CT ABDOMEN AND PELVIS WITH CONTRAST TECHNIQUE: Multidetector CT imaging of the abdomen and pelvis was performed using the standard protocol following bolus administration of intravenous contrast. CONTRAST:  182mL OMNIPAQUE IOHEXOL 300 MG/ML  SOLN COMPARISON:  None. FINDINGS: Lower chest: No acute abnormality. Hepatobiliary: Within the posterior right hepatic lobe there is a partially calcified lesion measuring 2.1 cm, image 15/2. No biliary ductal dilatation. Pancreas: Unremarkable. No pancreatic ductal dilatation or surrounding inflammatory changes. Spleen: Normal in size without focal abnormality. Adrenals/Urinary Tract: Normal appearance of the adrenal glands. The right kidney is unremarkable. Scarring and mild volume loss from the left kidney noted. The urinary bladder appears unremarkable. Stomach/Bowel: Stomach and the small bowel loops appear within normal limits. No bowel wall thickening, inflammation or distension. No obstructing colon mass. Gas and stool noted throughout the colon. Focal area of luminal narrowing within the ascending colon is nonspecific and may represent an area of normal physiologic contraction. Vascular/Lymphatic: Aortic atherosclerosis. No enlarged abdominal or pelvic lymph nodes. Reproductive: Status post hysterectomy. No adnexal masses. Other: No ascites or focal fluid collections. Musculoskeletal: No acute or significant osseous findings. IMPRESSION: 1. No acute findings identified within the abdomen or pelvis. 2. There is a partially  calcified lesion within the posterior right hepatic lobe. This is favored to represent a benign abnormality such as a hemangioma. 3. Aortic atherosclerosis. 4. Focal area of luminal narrowing within the ascending colon is nonspecific and may represent an area of normal physiologic  contraction. In a patient who presents with correlation with colon cancer screening is advised. Aortic Atherosclerosis (ICD10-I70.0). Electronically Signed   By: Kerby Moors M.D.   On: 07/08/2019 12:08       Active Problems:   Rectal bleeding     LOS: 1 day   Tye Savoy ,NP 07/09/2019, 9:17 AM     '

## 2019-07-09 NOTE — Evaluation (Signed)
Physical Therapy One Time Evaluation Patient Details Name: Christine Middleton MRN: [id number] DOB: [birth year] Today's Date: 07/09/2019   History of Present Illness  85 year old female was admitted for rectal bleeding.  PMH:  HTN and CVA  Clinical Impression  Patient evaluated by Physical Therapy with no further acute PT needs identified. All education has been completed and the patient has no further questions. See below for any follow-up Physical Therapy or equipment needs. PT is signing off. Thank you for this referral.  Pt very independent at baseline.  Pt ambulated in hallway good distance without assistive device and reports no recent falls.  Pt would benefit from nursing staff ambulating pt during acute stay.     Follow Up Recommendations No PT follow up;Supervision - Intermittent    Equipment Recommendations  None recommended by PT    Recommendations for Other Services       Precautions / Restrictions Precautions Precautions: Fall Restrictions Weight Bearing Restrictions: No      Mobility  Bed Mobility Overal bed mobility: Independent                Transfers Overall transfer level: Needs assistance Equipment used: None Transfers: Sit to/from Stand Sit to Stand: Supervision Stand pivot transfers: Min guard       General transfer comment: supervision for safety  Ambulation/Gait Ambulation/Gait assistance: Min guard;Supervision Gait Distance (Feet): 280 Feet Assistive device: None Gait Pattern/deviations: Step-through pattern;Decreased stride length     General Gait Details: mildly unsteady initially however improved with distance; pt reports no recent falls  Stairs            Wheelchair Mobility    Modified Rankin (Stroke Patients Only)       Balance Overall balance assessment: Needs assistance         Standing balance support: No upper extremity supported Standing balance-Leahy Scale: Good                                Pertinent Vitals/Pain Pain Assessment: No/denies pain    Home Living Family/patient expects to be discharged to:: Private residence Living Arrangements: Children Available Help at Discharge: Family;Available PRN/intermittently Type of Home: House Home Access: Ramped entrance     Home Layout: Multi-level Home Equipment: Grab bars - toilet;Grab bars - tub/shower;Hand held shower head Additional Comments: lives with son    Prior Function Level of Independence: Independent               Hand Dominance        Extremity/Trunk Assessment   Upper Extremity Assessment Upper Extremity Assessment: Overall WFL for tasks assessed    Lower Extremity Assessment Lower Extremity Assessment: Overall WFL for tasks assessed    Cervical / Trunk Assessment Cervical / Trunk Assessment: Normal  Communication   Communication: No difficulties  Cognition Arousal/Alertness: Awake/alert Behavior During Therapy: WFL for tasks assessed/performed Overall Cognitive Status: Within Functional Limits for tasks assessed                                        General Comments      Exercises     Assessment/Plan    PT Assessment Patent does not need any further PT services  PT Problem List         PT Treatment Interventions      PT Goals (  Current goals can be found in the Care Plan section)  Acute Rehab PT Goals Patient Stated Goal: bleeding to stop and return home PT Goal Formulation: All assessment and education complete, DC therapy    Frequency     Barriers to discharge        Co-evaluation               AM-PAC PT "6 Clicks" Mobility  Outcome Measure Help needed turning from your back to your side while in a flat bed without using bedrails?: None Help needed moving from lying on your back to sitting on the side of a flat bed without using bedrails?: None Help needed moving to and from a bed to a chair (including a wheelchair)?: A Little Help  needed standing up from a chair using your arms (e.g., wheelchair or bedside chair)?: A Little Help needed to walk in hospital room?: A Little Help needed climbing 3-5 steps with a railing? : A Little 6 Click Score: 20    End of Session   Activity Tolerance: Patient tolerated treatment well Patient left: in chair;with call bell/phone within reach;with chair alarm set;with family/visitor present Nurse Communication: Mobility status PT Visit Diagnosis: Difficulty in walking, not elsewhere classified (R26.2)    Time: [id number] PT Time Calculation (min) (ACUTE ONLY): 10 min   Charges:   PT Evaluation $PT Eval Low Complexity: [place], PT, DPT Acute Rehabilitation Services Office: [phone] Pager: [phone]  Trena Platt 07/09/2019, 12:26 PM

## 2019-07-09 NOTE — H&P (View-Only) (Signed)
Progress Note    ASSESSMENT AND PLAN:    1. Acute on chronic painless rectal bleeding on Eliquis. Bleeding probably hemorrhoidal but should exclude other etiologies such as diverticular hemorrhage or polyp / neoplasm. Focal narrowing in colon on CT scan- probably non-specific. No bleeding today and sounds like none since admission.  -Colonoscopy tomorrow. The risks and benefits of colonoscopy with possible polypectomy / biopsies were discussed and the patient agrees to proceed.   2. Mild Cesar Chavez anemia secondary to #1. Hgb of 10.5, baseline around 12.5.  -continue to monitor. Suspect hgb will stabilize.   3. PAF, home Eliquis is on hold     SUBJECTIVE       OBJECTIVE:     Vital signs in last 24 hours: Temp:  [97.8 F (36.6 C)-98.6 F (37 C)] 98.6 F (37 C) (12/09 0907) Pulse Rate:  [66-88] 69 (12/09 0907) Resp:  [13-23] 18 (12/09 0907) BP: (114-157)/(59-90) 129/72 (12/09 0907) SpO2:  [95 %-100 %] 96 % (12/09 0907) Weight:  [64.4 kg-65.3 kg] 64.4 kg (12/09 0719) Last BM Date: 07/08/19 General:   Alert, well-developed female in NAD EENT:  Normal hearing, non icteric sclera   Heart:  Regular rate, irreg rhythm;  No lower extremity edema   Pulm: Normal respiratory effort   Abdomen:  Soft, nondistended, nontender.  Normal bowel sounds.          Neurologic:  Alert and  oriented x4;  grossly normal neurologically. Psych:  Pleasant, cooperative.  Normal mood and affect.   Intake/Output from previous day: 12/08 0701 - 12/09 0700 In: 2585.3 [I.V.:2085.3; IV Piggyback:500] Out: [id number] Intake/Output this shift: No intake/output data recorded.  Lab Results: Recent Labs    07/08/19 1007 07/08/19 2143 07/09/19 0605  WBC 7.7 9.6 8.3  HGB 11.5* 11.0* 10.5*  HCT 35.5* 34.0* 32.5*  PLT 251 236 232   BMET Recent Labs    07/08/19 1007 07/09/19 0605  NA 140 142  K 3.8 3.8  CL 105 110  CO2 24 24  GLUCOSE 139* 102*  BUN 16 8  CREATININE 1.10* 0.83    CALCIUM 8.9 8.6*    Ct Abdomen Pelvis W Contrast  Result Date: 07/08/2019 CLINICAL DATA:  Abdominal distension, rectal bleeding for 3 weeks. Lightheadedness. EXAM: CT ABDOMEN AND PELVIS WITH CONTRAST TECHNIQUE: Multidetector CT imaging of the abdomen and pelvis was performed using the standard protocol following bolus administration of intravenous contrast. CONTRAST:  169mL OMNIPAQUE IOHEXOL 300 MG/ML  SOLN COMPARISON:  None. FINDINGS: Lower chest: No acute abnormality. Hepatobiliary: Within the posterior right hepatic lobe there is a partially calcified lesion measuring 2.1 cm, image 15/2. No biliary ductal dilatation. Pancreas: Unremarkable. No pancreatic ductal dilatation or surrounding inflammatory changes. Spleen: Normal in size without focal abnormality. Adrenals/Urinary Tract: Normal appearance of the adrenal glands. The right kidney is unremarkable. Scarring and mild volume loss from the left kidney noted. The urinary bladder appears unremarkable. Stomach/Bowel: Stomach and the small bowel loops appear within normal limits. No bowel wall thickening, inflammation or distension. No obstructing colon mass. Gas and stool noted throughout the colon. Focal area of luminal narrowing within the ascending colon is nonspecific and may represent an area of normal physiologic contraction. Vascular/Lymphatic: Aortic atherosclerosis. No enlarged abdominal or pelvic lymph nodes. Reproductive: Status post hysterectomy. No adnexal masses. Other: No ascites or focal fluid collections. Musculoskeletal: No acute or significant osseous findings. IMPRESSION: 1. No acute findings identified within the abdomen or pelvis. 2. There is a partially  calcified lesion within the posterior right hepatic lobe. This is favored to represent a benign abnormality such as a hemangioma. 3. Aortic atherosclerosis. 4. Focal area of luminal narrowing within the ascending colon is nonspecific and may represent an area of normal physiologic  contraction. In a patient who presents with correlation with colon cancer screening is advised. Aortic Atherosclerosis (ICD10-I70.0). Electronically Signed   By: Kerby Moors M.D.   On: 07/08/2019 12:08       Active Problems:   Rectal bleeding     LOS: 1 day   Tye Savoy ,NP 07/09/2019, 9:17 AM     '

## 2019-07-09 NOTE — Evaluation (Signed)
Occupational Therapy Evaluation Patient Details Name: Christine Middleton MRN: [id number] DOB: 1922-08-19 Today's Date: 07/09/2019    History of Present Illness 85 year old female was admitted for rectal bleeding.  PMH:  HTN and CVA   Clinical Impression   Pt was admitted for the above. At baseline, she is independent with basic adls.  She lives with her son, and has grab bars in tub and around toilet. Will further assess DME needs and follow with mod I level goals.  At this time, she needs min guard for safety    Follow Up Recommendations  Supervision/Assistance - 24 hour(likely no follow up OT)    Equipment Recommendations  (tba further)    Recommendations for Other Services       Precautions / Restrictions Precautions Precautions: Fall Restrictions Weight Bearing Restrictions: No      Mobility Bed Mobility Overal bed mobility: Independent                Transfers Overall transfer level: Needs assistance Equipment used: None Transfers: Sit to/from Stand;Stand Pivot Transfers Sit to Stand: Min guard Stand pivot transfers: Min guard       General transfer comment: for safety; slightly unsteady with SPT    Balance Overall balance assessment: Mild deficits observed, not formally tested                                         ADL either performed or assessed with clinical judgement   ADL Overall ADL's : Needs assistance/impaired             Lower Body Bathing: Min guard;Sit to/from stand       Lower Body Dressing: Min guard;Sit to/from stand   Toilet Transfer: Radio broadcast assistant)   Toileting- Water quality scientist and Hygiene: Min guard;Sit to/from stand         General ADL Comments: pt needs set up only for UB adls.  Got up to chair, changed underwear and purewick.  Min guard for safety; pt a little unsteady. She doesn't use an AD at home.  Educated to sponge bathe initially if she needs to at first as she doesn't have a  seat, and she is weaker than baseline     Vision         Perception     Praxis      Pertinent Vitals/Pain Pain Assessment: No/denies pain     Hand Dominance     Extremity/Trunk Assessment Upper Extremity Assessment Upper Extremity Assessment: Overall WFL for tasks assessed           Communication Communication Communication: No difficulties   Cognition Arousal/Alertness: Awake/alert Behavior During Therapy: WFL for tasks assessed/performed Overall Cognitive Status: Within Functional Limits for tasks assessed                                     General Comments       Exercises     Shoulder Instructions      Home Living Family/patient expects to be discharged to:: Private residence Living Arrangements: Children                 Bathroom Shower/Tub: Teacher, early years/pre: Standard     Home Equipment: Grab bars - toilet;Grab bars - tub/shower;Hand held shower head   Additional Comments: lives  with son      Prior Functioning/Environment Level of Independence: Independent                 OT Problem List: Decreased strength;Decreased activity tolerance;Impaired balance (sitting and/or standing)      OT Treatment/Interventions: Self-care/ADL training;Energy conservation;DME and/or AE instruction;Balance training;Patient/family education    OT Goals(Current goals can be found in the care plan section) Acute Rehab OT Goals Patient Stated Goal: bleeding to stop and return home OT Goal Formulation: With patient Time For Goal Achievement: 07/23/19 Potential to Achieve Goals: Good ADL Goals Pt Will Transfer to Toilet: with modified independence;regular height toilet;grab bars Pt Will Perform Tub/Shower Transfer: Tub transfer;with modified independence;ambulating;grab bars Additional ADL Goal #1: pt will gather clothes and perform adl at mod I level  OT Frequency: Min 2X/week   Barriers to D/C:             Co-evaluation              AM-PAC OT "6 Clicks" Daily Activity     Outcome Measure Help from another person eating meals?: None Help from another person taking care of personal grooming?: A Little Help from another person toileting, which includes using toliet, bedpan, or urinal?: A Little Help from another person bathing (including washing, rinsing, drying)?: A Little Help from another person to put on and taking off regular upper body clothing?: A Little Help from another person to put on and taking off regular lower body clothing?: A Little 6 Click Score: 19   End of Session Nurse Communication: (no chair alarm box in room)  Activity Tolerance: Patient tolerated treatment well Patient left: in chair;with call bell/phone within reach  OT Visit Diagnosis: Unsteadiness on feet (R26.81)                Time: [id number] OT Time Calculation (min): 18 min Charges:  OT General Charges $OT Visit: 1 Visit OT Evaluation $OT Eval Low Complexity: [place], OTR/L Acute Rehabilitation Services [phone] [facility] pager [phone] office 07/09/2019  Christine Middleton 07/09/2019, 10:58 AM

## 2019-07-09 NOTE — Progress Notes (Addendum)
PROGRESS NOTE    Lezette B Schiffman    Code Status: DNR  [id number] DOB: February 25, 1923 DOA: 07/08/2019  PCP: Laurey Morale, MD    Hospital Summary  Is a 85 year old female with past medical history of hypertension, hyperlipidemia, GERD, CVA, paroxysmal atrial fibrillation on apixaban, reticulosis, rectal hemorrhoids who presented to the ED with acute on chronic BRBPR.  Admitted for lower GI bleed.  CT revealed focal area of luminal narrowing in ascending colon.  Eliquis held and GI consulted.  Patient is planned for colonoscopy on 12/10.  A & P   Active Problems:   Rectal bleeding   Acute on chronic painless rectal bleeding in setting of Eliquis, concern for hemorrhoidal afebrile and hemodynamically stable on room air.  No recurrence of bleeding.  Hemoglobin stable without requiring blood transfusion.  May be diverticular hemorrhage or polyp/neoplasm.  CT scan with luminal narrowing of ascending colon which is nonspecific.  Patient had incomplete colonoscopy on 12/09/2008 -Colonoscopy tomorrow  Normocytic Anemia secondary to GI bleed in setting of above Hemodynamically stable. Not requiring transfusion  Paroxysmal atrial fibrillation History of multifocal CVA Rate controlled and stable.  On apixaban and atenolol as outpatient -Continue atenolol and simvastatin -Hold apixaban  Hypertension stable -Continue atenolol and amlodipine -Continue to hold losartan  Hyperlipidemia continue simvastatin  GERD continue pantoprazole  DVT prophylaxis: Contraindicated in GI bleed Diet: Clear liquid diet, n.p.o. after midnight Family Communication: No family at bedside Disposition Plan: Pending colonoscopy findings, possible DC tomorrow  Consultants  GI  Procedures  None  Antibiotics  None      Subjective   Patient seen and examined sitting upright in chair resting comfortably on room air.  Denies any complaints at this time.  States that she has not had a recurrent bloody BM since  being in the hospital.  Nuys any abdominal pain, nausea, vomiting or any other complaints at this time  Objective   Vitals:   07/09/19 0542 07/09/19 0719 07/09/19 0907 07/09/19 1402  BP: 114/66  129/72 122/64  Pulse: 88  69 71  Resp: '16  18 16  ' Temp: 98.6 F (37 C)  98.6 F (37 C) 98.2 F (36.8 C)  TempSrc:   Oral Oral  SpO2: 95%  96% 98%  Weight:  64.4 kg    Height:        Intake/Output Summary (Last 24 hours) at 07/09/2019 1605 Last data filed at 07/09/2019 1500 Gross per 24 hour  Intake 4106.96 ml  Output 2000 ml  Net 2106.96 ml   Filed Weights   07/08/19 0951 07/09/19 0500 07/09/19 0719  Weight: 65.3 kg 65.3 kg 64.4 kg    Examination:  Physical Exam Vitals signs and nursing note reviewed.  Constitutional:      Appearance: Normal appearance.  HENT:     Head: Normocephalic and atraumatic.     Nose: Nose normal.     Mouth/Throat:     Mouth: Mucous membranes are moist.  Eyes:     Extraocular Movements: Extraocular movements intact.  Neck:     Musculoskeletal: Normal range of motion. No neck rigidity.  Cardiovascular:     Rate and Rhythm: Normal rate and regular rhythm.  Pulmonary:     Effort: Pulmonary effort is normal.     Breath sounds: Normal breath sounds.  Abdominal:     General: Abdomen is flat.     Palpations: Abdomen is soft.  Musculoskeletal: Normal range of motion.        General: No swelling.  Neurological:     General: No focal deficit present.     Mental Status: She is alert. Mental status is at baseline.  Psychiatric:        Mood and Affect: Mood normal.        Behavior: Behavior normal.     Data Reviewed: I have personally reviewed following labs and imaging studies  CBC: Recent Labs  Lab 07/08/19 1007 07/08/19 2143 07/09/19 0605  WBC 7.7 9.6 8.3  NEUTROABS 5.5  --   --   HGB 11.5* 11.0* 10.5*  HCT 35.5* 34.0* 32.5*  MCV 95.2 94.7 94.2  PLT 251 236 756   Basic Metabolic Panel: Recent Labs  Lab 07/08/19 1007 07/09/19 0605   NA 140 142  K 3.8 3.8  CL 105 110  CO2 24 24  GLUCOSE 139* 102*  BUN 16 8  CREATININE 1.10* 0.83  CALCIUM 8.9 8.6*   GFR: Estimated Creatinine Clearance: 36.5 mL/min (by C-G formula based on SCr of 0.83 mg/dL). Liver Function Tests: No results for input(s): AST, ALT, ALKPHOS, BILITOT, PROT, ALBUMIN in the last 168 hours. No results for input(s): LIPASE, AMYLASE in the last 168 hours. No results for input(s): AMMONIA in the last 168 hours. Coagulation Profile: No results for input(s): INR, PROTIME in the last 168 hours. Cardiac Enzymes: No results for input(s): CKTOTAL, CKMB, CKMBINDEX, TROPONINI in the last 168 hours. BNP (last 3 results) No results for input(s): PROBNP in the last 8760 hours. HbA1C: No results for input(s): HGBA1C in the last 72 hours. CBG: No results for input(s): GLUCAP in the last 168 hours. Lipid Profile: No results for input(s): CHOL, HDL, LDLCALC, TRIG, CHOLHDL, LDLDIRECT in the last 72 hours. Thyroid Function Tests: No results for input(s): TSH, T4TOTAL, FREET4, T3FREE, THYROIDAB in the last 72 hours. Anemia Panel: No results for input(s): VITAMINB12, FOLATE, FERRITIN, TIBC, IRON, RETICCTPCT in the last 72 hours. Sepsis Labs: No results for input(s): PROCALCITON, LATICACIDVEN in the last 168 hours.  Recent Results (from the past 240 hour(s))  SARS CORONAVIRUS 2 (TAT 6-24 HRS) Nasopharyngeal Nasopharyngeal Swab     Status: None   Collection Time: 07/08/19  1:17 PM   Specimen: Nasopharyngeal Swab  Result Value Ref Range Status   SARS Coronavirus 2 NEGATIVE NEGATIVE Final    Comment: (NOTE) SARS-CoV-2 target nucleic acids are NOT DETECTED. The SARS-CoV-2 RNA is generally detectable in upper and lower respiratory specimens during the acute phase of infection. Negative results do not preclude SARS-CoV-2 infection, do not rule out co-infections with other pathogens, and should not be used as the sole basis for treatment or other patient management  decisions. Negative results must be combined with clinical observations, patient history, and epidemiological information. The expected result is Negative. Fact Sheet for Patients: SugarRoll.be Fact Sheet for Healthcare Providers: https://www.woods-mathews.com/ This test is not yet approved or cleared by the [place] FDA and  has been authorized for detection and/or diagnosis of SARS-CoV-2 by FDA under an Emergency Use Authorization (EUA). This EUA will remain  in effect (meaning this test can be used) for the duration of the COVID-19 declaration under Section 56 4(b)(1) of the Act, 21 U.S.C. section 360bbb-3(b)(1), unless the authorization is terminated or revoked sooner. Performed at [facility], [place] [address], [place], [place] [address]          Radiology Studies: Ct Abdomen Pelvis W Contrast  Result Date: 07/08/2019 CLINICAL DATA:  Abdominal distension, rectal bleeding for 3 weeks. Lightheadedness. EXAM: CT ABDOMEN AND  PELVIS WITH CONTRAST TECHNIQUE: Multidetector CT imaging of the abdomen and pelvis was performed using the standard protocol following bolus administration of intravenous contrast. CONTRAST:  125m OMNIPAQUE IOHEXOL 300 MG/ML  SOLN COMPARISON:  None. FINDINGS: Lower chest: No acute abnormality. Hepatobiliary: Within the posterior right hepatic lobe there is a partially calcified lesion measuring 2.1 cm, image 15/2. No biliary ductal dilatation. Pancreas: Unremarkable. No pancreatic ductal dilatation or surrounding inflammatory changes. Spleen: Normal in size without focal abnormality. Adrenals/Urinary Tract: Normal appearance of the adrenal glands. The right kidney is unremarkable. Scarring and mild volume loss from the left kidney noted. The urinary bladder appears unremarkable. Stomach/Bowel: Stomach and the small bowel loops appear within normal limits. No bowel wall thickening, inflammation or distension. No  obstructing colon mass. Gas and stool noted throughout the colon. Focal area of luminal narrowing within the ascending colon is nonspecific and may represent an area of normal physiologic contraction. Vascular/Lymphatic: Aortic atherosclerosis. No enlarged abdominal or pelvic lymph nodes. Reproductive: Status post hysterectomy. No adnexal masses. Other: No ascites or focal fluid collections. Musculoskeletal: No acute or significant osseous findings. IMPRESSION: 1. No acute findings identified within the abdomen or pelvis. 2. There is a partially calcified lesion within the posterior right hepatic lobe. This is favored to represent a benign abnormality such as a hemangioma. 3. Aortic atherosclerosis. 4. Focal area of luminal narrowing within the ascending colon is nonspecific and may represent an area of normal physiologic contraction. In a patient who presents with correlation with colon cancer screening is advised. Aortic Atherosclerosis (ICD10-I70.0). Electronically Signed   By: TKerby MoorsM.D.   On: 07/08/2019 12:08        Scheduled Meds:  amLODipine  5 mg Oral Daily   atenolol  100 mg Oral Daily   docusate sodium  100 mg Oral BID   pantoprazole  40 mg Oral Daily   peg 3350 powder  0.5 kit Oral Once   And   [START ON 07/10/2019] peg 3350 powder  0.5 kit Oral Once   simvastatin  20 mg Oral QHS   Continuous Infusions:  sodium chloride 125 mL/hr at 07/08/19 1855     LOS: 1 day    Time spent: 15 minutes with over 50% of the time coordinating the patient's care    JHarold Hedge DO [facility] Pager [phone] If 7PM-7AM, please contact night-coverage [email or web address] Password [email or web address] 07/09/2019, 4:05 PM

## 2019-07-10 ENCOUNTER — Inpatient Hospital Stay (HOSPITAL_COMMUNITY): Payer: Medicare Other | Admitting: Anesthesiology

## 2019-07-10 ENCOUNTER — Encounter (HOSPITAL_COMMUNITY): Payer: Self-pay | Admitting: Internal Medicine

## 2019-07-10 ENCOUNTER — Encounter (HOSPITAL_COMMUNITY)
Admission: EM | Disposition: A | Discharge status: Home / Self Care | Payer: Self-pay | Source: Home / Self Care | Attending: Internal Medicine

## 2019-07-10 DIAGNOSIS — K635 Polyp of colon: Secondary | ICD-10-CM

## 2019-07-10 DIAGNOSIS — I482 Chronic atrial fibrillation, unspecified: Secondary | ICD-10-CM

## 2019-07-10 HISTORY — PX: POLYPECTOMY: SHX5525

## 2019-07-10 HISTORY — PX: COLONOSCOPY WITH PROPOFOL: SHX5780

## 2019-07-10 HISTORY — PX: HEMOSTASIS CLIP PLACEMENT: SHX6857

## 2019-07-10 LAB — GLUCOSE, CAPILLARY: Glucose-Capillary: 95 mg/dL (ref 70–99)

## 2019-07-10 LAB — BASIC METABOLIC PANEL
Anion gap: 9 (ref 5–15)
BUN: 9 mg/dL (ref 8–23)
CO2: 20 mmol/L — ABNORMAL LOW (ref 22–32)
Calcium: 8.7 mg/dL — ABNORMAL LOW (ref 8.9–10.3)
Chloride: 116 mmol/L — ABNORMAL HIGH (ref 98–111)
Creatinine, Ser: 0.88 mg/dL (ref 0.44–1.00)
GFR calc Af Amer: >60 mL/min (ref >60)
GFR calc non Af Amer: 56 mL/min — ABNORMAL LOW (ref >60)
Glucose, Bld: 116 mg/dL — ABNORMAL HIGH (ref 70–99)
Potassium: 3.8 mmol/L (ref 3.5–5.1)
Sodium: 145 mmol/L (ref 135–145)

## 2019-07-10 LAB — HEPARIN LEVEL (UNFRACTIONATED): Heparin Unfractionated: <0.1 IU/mL — ABNORMAL LOW (ref 0.30–0.70)

## 2019-07-10 LAB — CBC
HCT: 33.1 % — ABNORMAL LOW (ref 36.0–46.0)
Hemoglobin: 10.5 g/dL — ABNORMAL LOW (ref 12.0–15.0)
MCH: 30.6 pg (ref 26.0–34.0)
MCHC: 31.7 g/dL (ref 30.0–36.0)
MCV: 96.5 fL (ref 80.0–100.0)
Platelets: 236 10*3/uL (ref 150–400)
RBC: 3.43 MIL/uL — ABNORMAL LOW (ref 3.87–5.11)
RDW: 12.6 % (ref 11.5–15.5)
WBC: 7.8 10*3/uL (ref 4.0–10.5)
nRBC: 0 % (ref 0.0–0.2)

## 2019-07-10 LAB — APTT: aPTT: 29 seconds (ref 24–36)

## 2019-07-10 SURGERY — COLONOSCOPY WITH PROPOFOL
Anesthesia: Monitor Anesthesia Care

## 2019-07-10 MED ORDER — HEPARIN (PORCINE) 25000 UT/250ML-% IV SOLN
900.0000 [IU]/h | INTRAVENOUS | Status: DC
  Administered 2019-07-10: 23:00:00 900 [IU]/h via INTRAVENOUS
  Filled 2019-07-10: qty 250

## 2019-07-10 MED ORDER — SODIUM CHLORIDE 0.9 % IV SOLN
510.0000 mg | Freq: Once | INTRAVENOUS | Status: AC
  Administered 2019-07-11: 510 mg via INTRAVENOUS
  Filled 2019-07-10: qty 17

## 2019-07-10 MED ORDER — HYDROCORTISONE ACETATE 25 MG RE SUPP
25.0000 mg | Freq: Every day | RECTAL | Status: DC
  Administered 2019-07-10: 25 mg via RECTAL
  Filled 2019-07-10 (×2): qty 1

## 2019-07-10 MED ORDER — PROPOFOL 10 MG/ML IV BOLUS
INTRAVENOUS | Status: AC
  Filled 2019-07-10: qty 20

## 2019-07-10 MED ORDER — ONDANSETRON HCL 4 MG/2ML IJ SOLN
INTRAMUSCULAR | Status: DC | PRN
  Administered 2019-07-10: 4 mg via INTRAVENOUS

## 2019-07-10 MED ORDER — LACTATED RINGERS IV SOLN
INTRAVENOUS | Status: DC
  Administered 2019-07-10: 1000 mL via INTRAVENOUS
  Administered 2019-07-10: 15:00:00 via INTRAVENOUS

## 2019-07-10 MED ORDER — POLYETHYLENE GLYCOL 3350 17 G PO PACK
17.0000 g | PACK | Freq: Every day | ORAL | Status: DC
  Administered 2019-07-11: 09:00:00 17 g via ORAL
  Filled 2019-07-10: qty 1

## 2019-07-10 MED ORDER — DOCUSATE SODIUM 100 MG PO CAPS: 200.0000 mg | ORAL_CAPSULE | Freq: Every day | ORAL | Status: DC

## 2019-07-10 MED ORDER — PROPOFOL 10 MG/ML IV BOLUS
INTRAVENOUS | Status: DC | PRN
  Administered 2019-07-10 (×6): 40 mg via INTRAVENOUS
  Administered 2019-07-10: 20 mg via INTRAVENOUS
  Administered 2019-07-10 (×5): 40 mg via INTRAVENOUS

## 2019-07-10 MED ORDER — LIDOCAINE 2% (20 MG/ML) 5 ML SYRINGE
INTRAMUSCULAR | Status: DC | PRN
  Administered 2019-07-10: 40 mg via INTRAVENOUS

## 2019-07-10 SURGICAL SUPPLY — 22 items

## 2019-07-10 NOTE — Op Note (Addendum)
[facility] Patient Name: Christine Middleton Procedure Date: 07/10/2019 MRN: [id number] Attending MD: Justice Britain , MD Date of Birth: June 12, 1923 CSN: [id number] Age: 85 Admit Type: Outpatient Procedure:                Colonoscopy Indications:              Rectal bleeding, Hemorrhoids Providers:                Justice Britain, MD, Benay Pillow, RN, Grace Isaac, RN, Lazaro Arms, Technician Referring MD:             [facility] Medicines:                Monitored Anesthesia Care Complications:            No immediate complications. Estimated Blood Loss:     Estimated blood loss was minimal. Procedure:                Pre-Anesthesia Assessment:                           - Prior to the procedure, a History and Physical                            was performed, and patient medications and                            allergies were reviewed. The patient's tolerance of                            previous anesthesia was also reviewed. The risks                            and benefits of the procedure and the sedation                            options and risks were discussed with the patient.                            All questions were answered, and informed consent                            was obtained. Prior Anticoagulants: The patient has                            taken Eliquis (apixaban), last dose was 2 days                            prior to procedure. ASA Grade Assessment: III - A                            patient with severe systemic disease. After  reviewing the risks and benefits, the patient was                            deemed in satisfactory condition to undergo the                            procedure.                           After obtaining informed consent, the colonoscope                            was passed under direct vision. Throughout the                            procedure, the  patient's blood pressure, pulse, and                            oxygen saturations were monitored continuously. The                            [id number]-PH190L ([id number]) Olympus ultra slim endoscope                            was introduced through the anus and advanced to the                            the cecum, identified by appendiceal orifice and                            ileocecal valve. The colonoscopy was technically                            difficult and complex due to restricted mobility of                            the colon and a tortuous colon in the rectosigmoid                            that required significant lavage and >20 minutes to                            articulate the scope through. Successful completion                            of the procedure was aided by performing the                            maneuvers documented (below) in this report. The                            patient tolerated the procedure. The quality of the  bowel preparation was adequate. The ileocecal                            valve, appendiceal orifice, and rectum were                            photographed. Scope In: 1:03:15 PM Scope Out: 1:46:41 PM Scope Withdrawal Time: 0 hours 20 minutes 51 seconds  Total Procedure Duration: 0 hours 43 minutes 26 seconds  Findings:      The digital rectal exam findings include hemorrhoids with some evidence       of recent oozing. Pertinent negatives include no palpable rectal lesions       otherwise.      Multiple small-mouthed diverticula were found in the distal rectum,       recto-sigmoid colon and sigmoid colon.      A benign-appearing, intrinsic severe narrowing was found at 15 cm       proximal to the anus and was traversed in region of diverticulosis. This       required significant lavage to allow passage of the ultraslim       colonoscope.      A 5 mm polyp was found in the sigmoid colon. The polyp was sessile.  The       polyp was removed with a cold snare. Resection and retrieval were       complete. As she will need to go back on anticoagulation and the       difficulty of this procedure, I decided to prevent bleeding after the       polypectomy, one hemostatic clip was successfully placed (MR       conditional). There was no bleeding during, or at the end, of the       procedure.      Normal mucosa was found in the entire colon otherwise.      A non-bleeding mucosal tear was found in the distal rectum. This       measured 10 mm in length. Suspect this was from the scope as I was       trying to pass through the narrowed region in the R/S junction. To close       the defect, three hemostatic clips were successfully placed (MR       conditional). There was no bleeding at the end of the procedure.      Evidence of recently bleeding non-thrombosed external and internal       hemorrhoids were found during retroflexion, during perianal exam and       during digital exam. The hemorrhoids were Grade III (internal       hemorrhoids that prolapse but require manual reduction). No bleeding at       completion of procedure noted. Impression:               - Hemorrhoids found on digital rectal exam.                           - Diverticulosis in the distal rectum, in the                            recto-sigmoid colon and in the sigmoid colon.                           -  Narrowing at 15 cm proximal to the anus.                           - One 5 mm polyp in the sigmoid colon, removed with                            a cold snare. Resected and retrieved. Clip (MR                            conditional) was placed.                           - Normal mucosa in the entire examined colon                            otherwise.                           - Mucosal wrent in the distal rectum. Clips (MR                            conditional) were placed to decrease risk of                            bleeding.                            - Recently bleeding non-thrombosed external and                            internal hemorrhoids. Moderate Sedation:      Not Applicable - Patient had care per Anesthesia. Recommendation:           - The patient will be observed post-procedure,                            until all discharge criteria are met.                           - Return patient to hospital ward for ongoing care.                           - Patient has a contact number available for                            emergencies. The signs and symptoms of potential                            delayed complications were discussed with the                            patient. Return to normal activities tomorrow.                            Written discharge instructions were provided to the  patient.                           - High fiber diet.                           - Take Sitz baths QHS indefinitely.                           - Use FiberCon 1 tablet PO daily.                           - Colace 200 mg daily.                           - Miralax 1 capful (17 grams) in 8 ounces of water                            PO daily indefinitely.                           - Start Anusol suppositories QHS x 1 week and then                            QOD x 2 weeks.                           - May continue previous hemorrhoid cream prescribed                            by PCP.                           - Referral to Colorectal surgery to discuss options                            for hemorrhoid therapy if issues persist.                           - May restart anticoagulation on 12/12 PM to                            decrease risk of post colonoscopy bleeding from                            today's procedure/interventions.                           - The findings and recommendations were discussed                            with the patient.                           - The findings and  recommendations were discussed  with the patient's family.                           - The findings and recommendations were discussed                            with the referring physician. Procedure Code(s):        --- Professional ---                           [phone], Colonoscopy, flexible; with removal of                            tumor(s), polyp(s), or other lesion(s) by snare                            technique Diagnosis Code(s):        --- Professional ---                           K64.2, Third degree hemorrhoids                           K56.699, Other intestinal obstruction unspecified                            as to partial versus complete obstruction                           K63.5, Polyp of colon                           S36.69XA, Other injury of rectum, initial encounter                           K62.5, Hemorrhage of anus and rectum                           K57.30, Diverticulosis of large intestine without                            perforation or abscess without bleeding CPT copyright 2019 American Medical Association. All rights reserved. The codes documented in this report are preliminary and upon coder review may  be revised to meet current compliance requirements. Justice Britain, MD 07/10/2019 2:21:34 PM Number of Addenda: 0

## 2019-07-10 NOTE — Anesthesia Procedure Notes (Signed)
Procedure Name: MAC Date/Time: 07/10/2019 12:50 PM Performed by: Cynda Familia, CRNA Pre-anesthesia Checklist: Patient identified, Emergency Drugs available, Suction available, Patient being monitored and Timeout performed Patient Re-evaluated:Patient Re-evaluated prior to induction Oxygen Delivery Method: Simple face mask Placement Confirmation: positive ETCO2 and breath sounds checked- equal and bilateral Dental Injury: Teeth and Oropharynx as per pre-operative assessment

## 2019-07-10 NOTE — Interval H&P Note (Signed)
History and Physical Interval Note:  07/10/2019 12:48 PM  Christine Middleton  has presented today for surgery, with the diagnosis of rectal bleeding.  The various methods of treatment have been discussed with the patient and family. After consideration of risks, benefits and other options for treatment, the patient has consented to  Procedure(s): COLONOSCOPY WITH PROPOFOL (N/A) as a surgical intervention.  The patient's history has been reviewed, patient examined, no change in status, stable for surgery.  I have reviewed the patient's chart and labs.  Questions were answered to the patient's satisfaction.     [employer]

## 2019-07-10 NOTE — Progress Notes (Signed)
ANTICOAGULATION CONSULT NOTE - Initial Consult  Pharmacy Consult for Heparin  Indication: atrial fibrillation  Allergies  Allergen Reactions  . Amoxicillin     REACTION: rash  . Codeine   . Lipitor [Atorvastatin]     Muscle pain, stiff joints  . Lisinopril   . Nitrofurantoin     Patient Measurements: Height: 5\' 2"  (157.5 cm) Weight: 140 lb 6.9 oz (63.7 kg) IBW/kg (Calculated) : 50.1  Vital Signs: Temp: 97.5 F (36.4 C) (12/10 1444) Temp Source: Oral (12/10 1444) BP: 139/78 (12/10 1444) Pulse Rate: 69 (12/10 1444)  Labs: Recent Labs    07/08/19 1007 07/08/19 2143 07/09/19 0605 07/10/19 0629  HGB 11.5* 11.0* 10.5* 10.5*  HCT 35.5* 34.0* 32.5* 33.1*  PLT 251 236 232 236  CREATININE 1.10*  --  0.83 0.88    Estimated Creatinine Clearance: 34.2 mL/min (by C-G formula based on SCr of 0.88 mg/dL).   Medical History: Past Medical History:  Diagnosis Date  . Diverticulitis   . Esophageal stricture   . GERD (gastroesophageal reflux disease)   . Hyperlipidemia   . Hypertension   . Shingles   . Stroke ([place])   . Vitamin B 12 deficiency     Medications:  Medications Prior to Admission  Medication Sig Dispense Refill Last Dose  . acetaminophen (TYLENOL) 325 MG tablet Take 325 mg by mouth every 6 (six) hours as needed for mild pain or headache.   Past Week at Unknown time  . amLODipine (NORVASC) 5 MG tablet Take 1 tablet (5 mg total) by mouth daily. 90 tablet 3 07/08/2019 at Unknown time  . apixaban (ELIQUIS) 2.5 MG TABS tablet Take 1 tablet (2.5 mg total) by mouth 2 (two) times daily. 180 tablet 3 07/08/2019 at 8am  . atenolol (TENORMIN) 50 MG tablet Take 2 tablets (100 mg total) by mouth daily. 180 tablet 3 07/08/2019 at Unknown time  . losartan (COZAAR) 100 MG tablet TAKE 1 TABLET BY MOUTH EVERY DAY 90 tablet 3 07/08/2019 at Unknown time  . pantoprazole (PROTONIX) 40 MG tablet Take 1 tablet (40 mg total) by mouth daily. 90 tablet 3 07/08/2019 at Unknown time  .  polyvinyl alcohol (LIQUIFILM TEARS) 1.4 % ophthalmic solution Place 1 drop into both eyes as needed for dry eyes.   07/07/2019 at Unknown time  . simvastatin (ZOCOR) 20 MG tablet TAKE 1 TABLET (20 MG TOTAL) BY MOUTH AT BEDTIME. (Patient taking differently: Take 20 mg by mouth daily at 6 PM. TAKE 1 TABLET (20 MG TOTAL) BY MOUTH AT BEDTIME.) 90 tablet 3 07/07/2019 at Unknown time  . Pramoxine HCl 1 % CREA Apply 1 application topically 3 (three) times daily. (Patient taking differently: Apply 1 application topically 3 (three) times daily as needed (hemmroids). ) 340 g 2    Scheduled:  . amLODipine  5 mg Oral Daily  . atenolol  100 mg Oral Daily  . docusate sodium  100 mg Oral BID  . hydrocortisone  25 mg Rectal QHS  . pantoprazole  40 mg Oral Daily  . [START ON 07/11/2019] polyethylene glycol  17 g Oral Daily  . simvastatin  20 mg Oral QHS    Assessment: 85 y.o. female with history of paroxysmal atrial fibrillation on Eliquis 2.5 mg BID at home. Eliquis was held on admission due to lower GI bleed. Underwent colonoscopy today, GI recommends heparin bridge starting 8 hours post colonoscopy. Hgb and plts stable.   Goal of Therapy:  Heparin level 0.3-0.7 units/ml Monitor platelets by anticoagulation  protocol: Yes   Plan:  Check baseline heparin level and aPTT Start heparin infusion at 900 units/hr Check anti-Xa level in 8 hours and daily while on heparin Continue to monitor H&H and platelets  Darnelle Bos 07/10/2019,4:05 PM

## 2019-07-10 NOTE — Transfer of Care (Signed)
Immediate Anesthesia Transfer of Care Note  Patient: Christine Middleton  Procedure(s) Performed: COLONOSCOPY WITH PROPOFOL (N/A ) POLYPECTOMY HEMOSTASIS CLIP PLACEMENT  Patient Location: PACU and Endoscopy Unit  Anesthesia Type:MAC  Level of Consciousness: sedated  Airway & Oxygen Therapy: Patient Spontanous Breathing and Patient connected to face mask oxygen  Post-op Assessment: Report given to RN and Post -op Vital signs reviewed and stable  Post vital signs: Reviewed and stable  Last Vitals:  Vitals Value Taken Time  BP    Temp    Pulse    Resp    SpO2      Last Pain:  Vitals:   07/10/19 1202  TempSrc: Oral  PainSc: 0-No pain         Complications: No apparent anesthesia complications

## 2019-07-10 NOTE — Care Management Important Message (Signed)
Important Message  Patient Details IM Letter given to Roque Lias SW Case Manager to present to the Patient Name: Christine Middleton MRN: [id number] Date of Birth: 1923-05-30   Medicare Important Message Given:  Yes     Kerin Salen 07/10/2019, 12:41 PM

## 2019-07-10 NOTE — Progress Notes (Signed)
PROGRESS NOTE    Christine Middleton    Code Status: DNR  [id number] DOB: 06-01-1923 DOA: 07/08/2019  PCP: Laurey Morale, MD    Hospital Summary  Is a 85 year old female with past medical history of hypertension, hyperlipidemia, GERD, CVA, paroxysmal atrial fibrillation on apixaban, reticulosis, rectal hemorrhoids who presented to the ED with acute on chronic BRBPR.  Admitted for lower GI bleed.  CT revealed focal area of luminal narrowing in ascending colon.  Eliquis held and GI consulted.  Patient is planned for colonoscopy on 12/10.  A & P   Active Problems:   Rectal bleeding   Acute on chronic painless rectal bleeding in setting of Eliquis, concern for hemorrhoidal afebrile and hemodynamically stable on room air.  Recurrent bleed this a.m. hemoglobin stable without requiring blood transfusion.  CT scan with luminal narrowing of ascending colon which is nonspecific.  Patient had incomplete colonoscopy on 12/09/2008.  Colonoscopy 12/10: Hemorrhoids, diverticulosis in rectum, 5 mm polyp removed and clipped, narrowing at 15 cm proximal to the anus, mucosal tear at distal rectum with clips placed recently bleeding nonthrombosed external and internal hemorrhoids.  Bleeding likely from hemorrhoids -GI recommendations: High-fiber diet, sitz bath's nightly indefinitely, FiberCon 1 tablet p.o. daily, Colace 200 mg daily, MiraLAX p.o. daily indefinitely, Anusol suppositories nightly x1 week then every other day x2 weeks, previous hemorrhoid cream prescribed, colorectal surgery referral at discharge for hemorrhoid therapy if issues persist.  Can restart home anticoagulation on 12/12 evening  Paroxysmal atrial fibrillation History of multifocal CVA CHA2DS2-VASc: 6.  rate controlled and stable.  On apixaban and atenolol as outpatient -Continue atenolol and simvastatin -Heparin bridge to start this evening at 10 PM.  This was discussed with GI -Can restart Eliquis on 12/12 evening  Hypertension  stable -Continue atenolol and amlodipine -Continue to hold losartan  Hyperlipidemia continue simvastatin  GERD continue pantoprazole  DVT prophylaxis: Contraindicated in GI bleed Diet: Heart healthy Family Communication: No family at bedside Disposition Plan: Observation for bleed overnight, likely DC tomorrow  Consultants  GI  Procedures  None  Antibiotics  None      Subjective   Patient reported she had recurrent blood in stool this morning.  No abdominal pain.  No other complaints.  Overnight events Objective   Vitals:   07/10/19 1355 07/10/19 1400 07/10/19 1410 07/10/19 1444  BP: 118/62 127/69 (!) 128/57 139/78  Pulse: 66 67 67 69  Resp: 17 (!) 27 18 18   Temp: 98 F (36.7 C)   (!) 97.5 F (36.4 C)  TempSrc: Oral   Oral  SpO2: 98% 100% 98% 96%  Weight:      Height:        Intake/Output Summary (Last 24 hours) at 07/10/2019 1448 Last data filed at 07/10/2019 1344 Gross per 24 hour  Intake 3719.06 ml  Output 0 ml  Net 3719.06 ml   Filed Weights   07/09/19 0719 07/10/19 0617 07/10/19 1202  Weight: 64.4 kg 63.7 kg 63.7 kg    Examination:  Physical Exam Vitals and nursing note reviewed.  Constitutional:      Appearance: Normal appearance.  HENT:     Head: Normocephalic and atraumatic.     Nose: Nose normal.     Mouth/Throat:     Mouth: Mucous membranes are moist.  Eyes:     Extraocular Movements: Extraocular movements intact.  Cardiovascular:     Rate and Rhythm: Normal rate and regular rhythm.  Pulmonary:     Effort: Pulmonary effort is normal.  Breath sounds: Normal breath sounds.  Abdominal:     General: Abdomen is flat.     Palpations: Abdomen is soft.  Musculoskeletal:        General: No swelling. Normal range of motion.     Cervical back: Normal range of motion. No rigidity.  Neurological:     General: No focal deficit present.     Mental Status: She is alert. Mental status is at baseline.  Psychiatric:        Mood and Affect:  Mood normal.        Behavior: Behavior normal.     Data Reviewed: I have personally reviewed following labs and imaging studies  CBC: Recent Labs  Lab 07/08/19 1007 07/08/19 2143 07/09/19 0605 07/10/19 0629  WBC 7.7 9.6 8.3 7.8  NEUTROABS 5.5  --   --   --   HGB 11.5* 11.0* 10.5* 10.5*  HCT 35.5* 34.0* 32.5* 33.1*  MCV 95.2 94.7 94.2 96.5  PLT 251 236 232 [id number]   Basic Metabolic Panel: Recent Labs  Lab 07/08/19 1007 07/09/19 0605 07/10/19 0629  NA 140 142 145  K 3.8 3.8 3.8  CL 105 110 116*  CO2 24 24 20*  GLUCOSE 139* 102* 116*  BUN 16 8 9   CREATININE 1.10* 0.83 0.88  CALCIUM 8.9 8.6* 8.7*   GFR: Estimated Creatinine Clearance: 34.2 mL/min (by C-G formula based on SCr of 0.88 mg/dL). Liver Function Tests: No results for input(s): AST, ALT, ALKPHOS, BILITOT, PROT, ALBUMIN in the last 168 hours. No results for input(s): LIPASE, AMYLASE in the last 168 hours. No results for input(s): AMMONIA in the last 168 hours. Coagulation Profile: No results for input(s): INR, PROTIME in the last 168 hours. Cardiac Enzymes: No results for input(s): CKTOTAL, CKMB, CKMBINDEX, TROPONINI in the last 168 hours. BNP (last 3 results) No results for input(s): PROBNP in the last 8760 hours. HbA1C: No results for input(s): HGBA1C in the last 72 hours. CBG: No results for input(s): GLUCAP in the last 168 hours. Lipid Profile: No results for input(s): CHOL, HDL, LDLCALC, TRIG, CHOLHDL, LDLDIRECT in the last 72 hours. Thyroid Function Tests: No results for input(s): TSH, T4TOTAL, FREET4, T3FREE, THYROIDAB in the last 72 hours. Anemia Panel: No results for input(s): VITAMINB12, FOLATE, FERRITIN, TIBC, IRON, RETICCTPCT in the last 72 hours. Sepsis Labs: No results for input(s): PROCALCITON, LATICACIDVEN in the last 168 hours.  Recent Results (from the past 240 hour(s))  SARS CORONAVIRUS 2 (TAT 6-24 HRS) Nasopharyngeal Nasopharyngeal Swab     Status: None   Collection Time: 07/08/19   1:17 PM   Specimen: Nasopharyngeal Swab  Result Value Ref Range Status   SARS Coronavirus 2 NEGATIVE NEGATIVE Final    Comment: (NOTE) SARS-CoV-2 target nucleic acids are NOT DETECTED. The SARS-CoV-2 RNA is generally detectable in upper and lower respiratory specimens during the acute phase of infection. Negative results do not preclude SARS-CoV-2 infection, do not rule out co-infections with other pathogens, and should not be used as the sole basis for treatment or other patient management decisions. Negative results must be combined with clinical observations, patient history, and epidemiological information. The expected result is Negative. Fact Sheet for Patients: SugarRoll.be Fact Sheet for Healthcare Providers: https://www.woods-mathews.com/ This test is not yet approved or cleared by the [place] FDA and  has been authorized for detection and/or diagnosis of SARS-CoV-2 by FDA under an Emergency Use Authorization (EUA). This EUA will remain  in effect (meaning this test can be used) for the  duration of the COVID-19 declaration under Section 56 4(b)(1) of the Act, 21 U.S.C. section 360bbb-3(b)(1), unless the authorization is terminated or revoked sooner. Performed at [facility], [place] [address], [place], [place] [address]          Radiology Studies: No results found.      Scheduled Meds: . amLODipine  5 mg Oral Daily  . atenolol  100 mg Oral Daily  . docusate sodium  100 mg Oral BID  . hydrocortisone  25 mg Rectal QHS  . pantoprazole  40 mg Oral Daily  . [START ON 07/11/2019] polyethylene glycol  17 g Oral Daily  . simvastatin  20 mg Oral QHS   Continuous Infusions: . sodium chloride 125 mL/hr at 07/10/19 0538  . lactated ringers 10 mL/hr at 07/10/19 1445     LOS: 2 days    Time spent: 20 minutes with over 50% of the time coordinating the patient's care    Harold Hedge, DO [facility] Pager [phone]  If 7PM-7AM, please contact night-coverage [email or web address] Password [email or web address] 07/10/2019, 2:48 PM

## 2019-07-10 NOTE — Anesthesia Postprocedure Evaluation (Signed)
Anesthesia Post Note  Patient: Christine Middleton  Procedure(s) Performed: COLONOSCOPY WITH PROPOFOL (N/A ) POLYPECTOMY HEMOSTASIS CLIP PLACEMENT     Patient location during evaluation: Endoscopy Anesthesia Type: MAC Level of consciousness: awake and alert Pain management: pain level controlled Vital Signs Assessment: post-procedure vital signs reviewed and stable Respiratory status: spontaneous breathing, nonlabored ventilation, respiratory function stable and patient connected to nasal cannula oxygen Cardiovascular status: stable and blood pressure returned to baseline Postop Assessment: no apparent nausea or vomiting Anesthetic complications: no    Last Vitals:  Vitals:   07/10/19 1420 07/10/19 1444  BP: 136/65 139/78  Pulse: 70 69  Resp: 17 18  Temp:  (!) 36.4 C  SpO2: 99% 96%    Last Pain:  Vitals:   07/10/19 1444  TempSrc: Oral  PainSc:                  [name]

## 2019-07-10 NOTE — Anesthesia Preprocedure Evaluation (Addendum)
Anesthesia Evaluation  Patient identified by MRN, date of birth, ID band Patient awake    Reviewed: Allergy & Precautions, NPO status   Airway Mallampati: II  TM Distance: >3 FB Neck ROM: Full    Dental  (+) Teeth Intact, Dental Advisory Given   Pulmonary    breath sounds clear to auscultation       Cardiovascular hypertension,  Rhythm:Regular Rate:Normal     Neuro/Psych    GI/Hepatic   Endo/Other    Renal/GU      Musculoskeletal   Abdominal   Peds  Hematology   Anesthesia Other Findings   Reproductive/Obstetrics                            Anesthesia Physical Anesthesia Plan  ASA: III  Anesthesia Plan: MAC   Post-op Pain Management:    Induction: Intravenous  PONV Risk Score and Plan: Ondansetron and Propofol infusion  Airway Management Planned: Natural Airway and Simple Face Mask  Additional Equipment:   Intra-op Plan:   Post-operative Plan:   Informed Consent: I have reviewed the patients History and Physical, chart, labs and discussed the procedure including the risks, benefits and alternatives for the proposed anesthesia with the patient or authorized representative who has indicated his/her understanding and acceptance.       Plan Discussed with: CRNA and Anesthesiologist  Anesthesia Plan Comments:         Anesthesia Quick Evaluation

## 2019-07-11 ENCOUNTER — Encounter: Payer: Self-pay | Admitting: *Deleted

## 2019-07-11 DIAGNOSIS — K648 Other hemorrhoids: Secondary | ICD-10-CM

## 2019-07-11 LAB — BASIC METABOLIC PANEL
Anion gap: 9 (ref 5–15)
BUN: 12 mg/dL (ref 8–23)
CO2: 22 mmol/L (ref 22–32)
Calcium: 8.5 mg/dL — ABNORMAL LOW (ref 8.9–10.3)
Chloride: 108 mmol/L (ref 98–111)
Creatinine, Ser: 0.8 mg/dL (ref 0.44–1.00)
GFR calc Af Amer: >60 mL/min (ref >60)
GFR calc non Af Amer: >60 mL/min (ref >60)
Glucose, Bld: 101 mg/dL — ABNORMAL HIGH (ref 70–99)
Potassium: 3.5 mmol/L (ref 3.5–5.1)
Sodium: 139 mmol/L (ref 135–145)

## 2019-07-11 LAB — HEMOGLOBIN AND HEMATOCRIT, BLOOD
HCT: 32.9 % — ABNORMAL LOW (ref 36.0–46.0)
Hemoglobin: 10.8 g/dL — ABNORMAL LOW (ref 12.0–15.0)

## 2019-07-11 LAB — CBC
HCT: 28.2 % — ABNORMAL LOW (ref 36.0–46.0)
Hemoglobin: 9.3 g/dL — ABNORMAL LOW (ref 12.0–15.0)
MCH: 31.1 pg (ref 26.0–34.0)
MCHC: 33 g/dL (ref 30.0–36.0)
MCV: 94.3 fL (ref 80.0–100.0)
Platelets: 201 10*3/uL (ref 150–400)
RBC: 2.99 MIL/uL — ABNORMAL LOW (ref 3.87–5.11)
RDW: 12.6 % (ref 11.5–15.5)
WBC: 8.5 10*3/uL (ref 4.0–10.5)
nRBC: 0 % (ref 0.0–0.2)

## 2019-07-11 LAB — HEPARIN LEVEL (UNFRACTIONATED)
Heparin Unfractionated: 0.65 IU/mL (ref 0.30–0.70)
Heparin Unfractionated: 1.02 IU/mL — ABNORMAL HIGH (ref 0.30–0.70)

## 2019-07-11 MED ORDER — SITZ BATH MISC: 1.0000 | Freq: Every day | 0 refills | Status: DC

## 2019-07-11 MED ORDER — POLYETHYLENE GLYCOL 3350 17 G PO PACK: 17.0000 g | PACK | Freq: Every day | ORAL | 0 refills | Status: DC

## 2019-07-11 MED ORDER — CALCIUM POLYCARBOPHIL 625 MG PO TABS
625.0000 mg | ORAL_TABLET | Freq: Every day | ORAL | Status: DC
  Administered 2019-07-11: 625 mg via ORAL
  Filled 2019-07-11: qty 1

## 2019-07-11 MED ORDER — CALCIUM POLYCARBOPHIL 625 MG PO TABS: 625.0000 mg | ORAL_TABLET | Freq: Every day | ORAL | 0 refills | Status: AC

## 2019-07-11 MED ORDER — HYDROCORTISONE ACETATE 25 MG RE SUPP: 25.0000 mg | Freq: Every day | RECTAL | 0 refills | Status: DC

## 2019-07-11 MED ORDER — HEPARIN (PORCINE) 25000 UT/250ML-% IV SOLN
850.0000 [IU]/h | INTRAVENOUS | Status: DC
  Filled 2019-07-11: qty 250

## 2019-07-11 MED ORDER — DOCUSATE SODIUM 100 MG PO CAPS: 100.0000 mg | ORAL_CAPSULE | Freq: Two times a day (BID) | ORAL | 0 refills | Status: DC

## 2019-07-11 NOTE — Progress Notes (Addendum)
ANTICOAGULATION CONSULT NOTE - Follow Up Consult  Pharmacy Consult for heparin Indication: hx atrial fibrillation and stroke (homeEliquis on hold)  Allergies  Allergen Reactions  . Amoxicillin     REACTION: rash  . Codeine   . Lipitor [Atorvastatin]     Muscle pain, stiff joints  . Lisinopril   . Nitrofurantoin     Patient Measurements: Height: 5\' 2"  (157.5 cm) Weight: 136 lb (61.7 kg) IBW/kg (Calculated) : 50.1 Heparin Dosing Weight: 62 kg  Vital Signs: Temp: 98.4 F (36.9 C) (12/11 0521) BP: 106/64 (12/11 0521) Pulse Rate: 73 (12/11 0521)  Labs: Recent Labs    07/09/19 0605 07/10/19 0629 07/10/19 1954 07/11/19 0636  HGB 10.5* 10.5*  --  9.3*  HCT 32.5* 33.1*  --  28.2*  PLT 232 236  --  201  APTT  --   --  29  --   HEPARINUNFRC  --   --  <0.10* 0.65  CREATININE 0.83 0.88  --  0.80    Estimated Creatinine Clearance: 37.1 mL/min (by C-G formula based on SCr of 0.8 mg/dL).   Medications:  - on Eliquis 2.5 mg bid PTA (last dose taken on 12/10 at 8a)  Assessment: Patient is a 85 y.o F with hx hemorrhoids, CVA and afib on Eliquis PTA, presented to the ED on 12/8 with rectal bleeding.  ELiquis was held on admission for bleeding.  She underwent colonoscopy on 12/10 (hemorrhoids with some evidence of recent oozing; resection of colon polyp with hemostatic clip).  Heparin drip was started post procedure at 11p on 12/11.    Today, 07/11/2019: - heparin level is 0.65 - hgb down slightly to 9.3, plts 201K - no new active bleeding documented - MD noted that plan is to resume Eliquis on 12/12 in the PM  Goal of Therapy:  Heparin level 0.3-0.5 units/ml (will use lower range d/t bleeding and colonoscopy on 12/10) Monitor platelets by anticoagulation protocol: Yes   Plan:  - reduce heparin drip down to 850 units/hr - check 8 hr heparin level - monitor for s/s bleeding  Yuriel Lopezmartinez P 07/11/2019,7:21 AM

## 2019-07-11 NOTE — Discharge Summary (Signed)
Physician Discharge Summary  Christine Middleton [id number] DOB: 02/01/1923 DOA: 07/08/2019  PCP: Laurey Morale, MD  Admit date: 07/08/2019 Discharge date: 07/11/2019   Code Status: DNR  Admitted From: Home Discharged to: [place]: None Equipment/Devices: None Discharge Condition: Stable  Recommendations for Outpatient Follow-up   1. Follow up with PCP in 1 week 2. Please follow up BMP/CBC  3. Follow-up with GI on 07/22/2019 at 2 PM 4. Referred to colorectal surgery for evaluation for surgical management of her hemorrhoids  Hospital Summary  This is a 85 year old female with past medical history of hypertension, hyperlipidemia, GERD, CVA, paroxysmal atrial fibrillation on apixaban, rectal hemorrhoids who presented to the ED with acute on chronic bright red blood per rectum and admitted for lower GI bleed.  CT revealed focal area of luminal narrowing of a ascending colon.  Her Eliquis was held on admission and GI was consulted.  Underwent colonoscopy on 12/10 with Dr. Rush Landmark: Hemorrhoids, diverticulosis in rectum, 5 mm polyp removed and clipped, narrowing at 15 cm proximal to the anus, mucosal tear at distal rectum with clips placed recently bleeding nonthrombosed external and internal hemorrhoids.  Bleeding likely from hemorrhoids.  She was placed on heparin bridge due to CHA2DS2-VASc: 6 8 hours post colonoscopy.  Following morning she had slight drop in hemoglobin from 10.5-> 9.3.  She was observed throughout the day and had repeat H&H which revealed hemoglobin 10.8.  She was discharged in stable condition with plans to follow-up in GI office on 12/22.  Referred to colorectal surgery for evaluation for surgical management of her hemorrhoids.  She was advised to hold her Eliquis for an additional 24 hours and restart on 12/12.  A & P   Active Problems:   Rectal bleeding   Acute on chronic painless rectal bleeding in setting of Eliquis, concern for hemorrhoidal source   -hemoglobin dropped from 10.5-> 9.3 this a.m., repeat H&H revealed hemoglobin 10.8.  Otherwise afebrile and hemodynamically stable on room air. -Admission CT scan with luminal narrowing of ascending colon which is nonspecific.  Patient had incomplete colonoscopy on 12/09/2008.   -Colonoscopy 12/10: Hemorrhoids, diverticulosis in rectum, 5 mm polyp removed and clipped, narrowing at 15 cm proximal to the anus, mucosal tear at distal rectum with clips placed recently bleeding nonthrombosed external and internal hemorrhoids.  Bleeding likely from hemorrhoids =GI recommendations: High-fiber diet, sitz bath's nightly indefinitely, FiberCon 1 tablet p.o. daily, Colace 200 mg daily, MiraLAX p.o. daily indefinitely, Anusol suppositories nightly x1 week then every other day x2 weeks, previous hemorrhoid cream prescribed, colorectal surgery referral at discharge for hemorrhoid therapy if issues persist.  Can restart home anticoagulation on 12/12 evening  Paroxysmal atrial fibrillation History of multifocal CVA CHA2DS2-VASc: 6.  rate controlled and stable.  On apixaban and atenolol as outpatient -Continue atenolol and simvastatin -Heparin bridged while inpatient -Can restart Eliquis on 12/12 evening  Hypertension stable -Continue atenolol and amlodipine -Continue to hold losartan  Hyperlipidemia continue simvastatin  GERD continue pantoprazole   Consultants  . GI  Procedures  . Colonoscopy  Antibiotics  None   Subjective  Patient seen and examined at bedside no acute distress and resting comfortably.  No events overnight.  Tolerating diet. In good spirits and anticipating discharge.   Denies any chest pain, shortness of breath, fever, nausea, vomiting, urinary or bowel complaints.  Denies any repeat bloody stools this a.m.  Otherwise ROS negative   Objective   Discharge Exam: Vitals:   07/11/19 0521 07/11/19 1418  BP:  106/64 (!) 117/56  Pulse: 73 73  Resp: 16 18  Temp: 98.4 F  (36.9 C) 98.2 F (36.8 C)  SpO2: 96% 97%   Vitals:   07/10/19 2137 07/11/19 0500 07/11/19 0521 07/11/19 1418  BP: 136/75  106/64 (!) 117/56  Pulse: 67  73 73  Resp: 20  16 18   Temp: 98 F (36.7 C)  98.4 F (36.9 C) 98.2 F (36.8 C)  TempSrc:    Oral  SpO2: 96%  96% 97%  Weight:  61.7 kg    Height:        Physical Exam Vitals and nursing note reviewed.  Constitutional:      Appearance: Normal appearance.  HENT:     Head: Normocephalic and atraumatic.     Nose: Nose normal.     Mouth/Throat:     Mouth: Mucous membranes are moist.  Eyes:     Extraocular Movements: Extraocular movements intact.  Cardiovascular:     Rate and Rhythm: Normal rate. Rhythm irregular.  Pulmonary:     Effort: Pulmonary effort is normal.     Breath sounds: Normal breath sounds.  Abdominal:     General: Abdomen is flat. There is no distension.     Palpations: Abdomen is soft.     Tenderness: There is no abdominal tenderness.  Musculoskeletal:        General: No swelling. Normal range of motion.     Cervical back: Normal range of motion. No rigidity.  Neurological:     General: No focal deficit present.     Mental Status: She is alert. Mental status is at baseline.  Psychiatric:        Mood and Affect: Mood normal.        Behavior: Behavior normal.       The results of significant diagnostics from this hospitalization (including imaging, microbiology, ancillary and laboratory) are listed below for reference.     Microbiology: Recent Results (from the past 240 hour(s))  SARS CORONAVIRUS 2 (TAT 6-24 HRS) Nasopharyngeal Nasopharyngeal Swab     Status: None   Collection Time: 07/08/19  1:17 PM   Specimen: Nasopharyngeal Swab  Result Value Ref Range Status   SARS Coronavirus 2 NEGATIVE NEGATIVE Final    Comment: (NOTE) SARS-CoV-2 target nucleic acids are NOT DETECTED. The SARS-CoV-2 RNA is generally detectable in upper and lower respiratory specimens during the acute phase of  infection. Negative results do not preclude SARS-CoV-2 infection, do not rule out co-infections with other pathogens, and should not be used as the sole basis for treatment or other patient management decisions. Negative results must be combined with clinical observations, patient history, and epidemiological information. The expected result is Negative. Fact Sheet for Patients: SugarRoll.be Fact Sheet for Healthcare Providers: https://www.woods-mathews.com/ This test is not yet approved or cleared by the [place] FDA and  has been authorized for detection and/or diagnosis of SARS-CoV-2 by FDA under an Emergency Use Authorization (EUA). This EUA will remain  in effect (meaning this test can be used) for the duration of the COVID-19 declaration under Section 56 4(b)(1) of the Act, 21 U.S.C. section 360bbb-3(b)(1), unless the authorization is terminated or revoked sooner. Performed at [facility], [place] [address], [place], [place] [address]      Labs: BNP (last 3 results) No results for input(s): BNP in the last 8760 hours. Basic Metabolic Panel: Recent Labs  Lab 07/08/19 1007 07/09/19 0605 07/10/19 0629 07/11/19 0636  NA 140 142 145 139  K  3.8 3.8 3.8 3.5  CL 105 110 116* 108  CO2 24 24 20* 22  GLUCOSE 139* 102* 116* 101*  BUN 16 8 9 12   CREATININE 1.10* 0.83 0.88 0.80  CALCIUM 8.9 8.6* 8.7* 8.5*   Liver Function Tests: No results for input(s): AST, ALT, ALKPHOS, BILITOT, PROT, ALBUMIN in the last 168 hours. No results for input(s): LIPASE, AMYLASE in the last 168 hours. No results for input(s): AMMONIA in the last 168 hours. CBC: Recent Labs  Lab 07/08/19 1007 07/08/19 2143 07/09/19 0605 07/10/19 0629 07/11/19 0636 07/11/19 1305  WBC 7.7 9.6 8.3 7.8 8.5  --   NEUTROABS 5.5  --   --   --   --   --   HGB 11.5* 11.0* 10.5* 10.5* 9.3* 10.8*  HCT 35.5* 34.0* 32.5* 33.1* 28.2* 32.9*  MCV 95.2 94.7 94.2 96.5 94.3   --   PLT 251 236 232 236 201  --    Cardiac Enzymes: No results for input(s): CKTOTAL, CKMB, CKMBINDEX, TROPONINI in the last 168 hours. BNP: Invalid input(s): POCBNP CBG: Recent Labs  Lab 07/10/19 1730  GLUCAP 95   D-Dimer No results for input(s): DDIMER in the last 72 hours. Hgb A1c No results for input(s): HGBA1C in the last 72 hours. Lipid Profile No results for input(s): CHOL, HDL, LDLCALC, TRIG, CHOLHDL, LDLDIRECT in the last 72 hours. Thyroid function studies No results for input(s): TSH, T4TOTAL, T3FREE, THYROIDAB in the last 72 hours.  Invalid input(s): FREET3 Anemia work up No results for input(s): VITAMINB12, FOLATE, FERRITIN, TIBC, IRON, RETICCTPCT in the last 72 hours. Urinalysis    Component Value Date/Time   COLORURINE YELLOW 08/20/2016 0424   APPEARANCEUR CLEAR 08/20/2016 0424   LABSPEC 1.016 08/20/2016 0424   PHURINE 5.0 08/20/2016 0424   GLUCOSEU NEGATIVE 08/20/2016 0424   HGBUR NEGATIVE 08/20/2016 0424   HGBUR negative 08/31/2009 0927   BILIRUBINUR neg 10/02/2018 1058   KETONESUR NEGATIVE 08/20/2016 0424   PROTEINUR Negative 10/02/2018 1058   PROTEINUR NEGATIVE 08/20/2016 0424   UROBILINOGEN 0.2 10/02/2018 1058   UROBILINOGEN 0.2 10/04/2013 2030   NITRITE neg 10/02/2018 1058   NITRITE NEGATIVE 08/20/2016 0424   LEUKOCYTESUR Negative 10/02/2018 1058   Sepsis Labs Invalid input(s): PROCALCITONIN,  WBC,  LACTICIDVEN Microbiology Recent Results (from the past 240 hour(s))  SARS CORONAVIRUS 2 (TAT 6-24 HRS) Nasopharyngeal Nasopharyngeal Swab     Status: None   Collection Time: 07/08/19  1:17 PM   Specimen: Nasopharyngeal Swab  Result Value Ref Range Status   SARS Coronavirus 2 NEGATIVE NEGATIVE Final    Comment: (NOTE) SARS-CoV-2 target nucleic acids are NOT DETECTED. The SARS-CoV-2 RNA is generally detectable in upper and lower respiratory specimens during the acute phase of infection. Negative results do not preclude SARS-CoV-2 infection, do  not rule out co-infections with other pathogens, and should not be used as the sole basis for treatment or other patient management decisions. Negative results must be combined with clinical observations, patient history, and epidemiological information. The expected result is Negative. Fact Sheet for Patients: SugarRoll.be Fact Sheet for Healthcare Providers: https://www.woods-mathews.com/ This test is not yet approved or cleared by the [place] FDA and  has been authorized for detection and/or diagnosis of SARS-CoV-2 by FDA under an Emergency Use Authorization (EUA). This EUA will remain  in effect (meaning this test can be used) for the duration of the COVID-19 declaration under Section 56 4(b)(1) of the Act, 21 U.S.C. section 360bbb-3(b)(1), unless the authorization is terminated or revoked  sooner. Performed at [facility], [place] [address], [place], [place] [address]     Discharge Instructions     Discharge Instructions    Ambulatory referral to Colorectal Surgery   Complete by: As directed    Evaluation for surgical management of hemorrhoids   Diet - low sodium heart healthy   Complete by: As directed    Discharge instructions   Complete by: As directed    You were seen and examined in the hospital for GI bleed and cared for by a hospitalist and gastroenterologist  Upon Discharge:  - High fiber diet. - Take Sitz baths nightly indefinitely. - Use FiberCon 1 tablet by mouth daily. - Colace 200 mg daily. - Miralax 1 capful (17 grams) in 8 ounces of water by mouth daily indefinitely. - Start Anusol suppositories nightly x 1 week and then every other day x 2 weeks. - May continue previous hemorrhoid cream prescribed by PCP. - Referral to Colorectal surgery to discuss options for hemorrhoid therapy if issues - Hold your Eliquis today. Restart your home dose of Eliquis on Saturday, 12/12 - You have an appointment with GI  on 12/22 - Make an appointment with your primary care physician within 7 days - Get lab work prior to your follow up appointment with your PCP  Bring all home medications to your appointment to review Request that your primary physician go over all hospital tests and procedures/radiological results at the follow up.   Please get all hospital records sent to your physician by signing a hospital release before you go home.     Read the complete instructions along with all the possible side effects for all the medicines you take and that have been prescribed to you. Take any new medicines after you have completely understood and accept all the possible adverse reactions/side effects.   If you have any questions about your discharge medications or the care you received while you were in the hospital, you can call the unit and asked to speak with the hospitalist on call. Once you are discharged, your primary care physician will handle any further medical issues. Please note that NO REFILLS for any discharge medications will be authorized, as it is imperative that you return to your primary care physician (or establish a relationship with a primary care physician if you do not have one) for your aftercare needs so that they can reassess your need for medications and monitor your lab values.   Do not drive, operate heavy machinery, perform activities at heights, swimming or participation in water activities or provide baby sitting services if your were admitted for loss of consciousness/seizures or if you are on sedating medications including, but not limited to benzodiazepines, sleep medications, narcotic pain medications, etc., until you have been cleared to do so by a medical doctor.   Do not take more than prescribed medications.   Wear a seat belt while driving.  If you have smoked or chewed Tobacco in the last 2 years please stop smoking; also stop any regular Alcohol and/or any Recreational  drug use including marijuana.  If you experience worsening of your admission symptoms or develop shortness of breath, chest pain, suicidal or homicidal thoughts or experience a life threatening emergency, you must seek medical attention immediately by calling 911 or calling your PCP immediately.   Increase activity slowly   Complete by: As directed      Allergies as of 07/11/2019      Reactions  Amoxicillin    REACTION: rash   Codeine    Lipitor [atorvastatin]    Muscle pain, stiff joints   Lisinopril    Nitrofurantoin       Medication List    TAKE these medications   acetaminophen 325 MG tablet Commonly known as: TYLENOL Take 325 mg by mouth every 6 (six) hours as needed for mild pain or headache.   amLODipine 5 MG tablet Commonly known as: NORVASC Take 1 tablet (5 mg total) by mouth daily.   apixaban 2.5 MG Tabs tablet Commonly known as: ELIQUIS Take 1 tablet (2.5 mg total) by mouth 2 (two) times daily.   atenolol 50 MG tablet Commonly known as: TENORMIN Take 2 tablets (100 mg total) by mouth daily.   docusate sodium 100 MG capsule Commonly known as: COLACE Take 1 capsule (100 mg total) by mouth 2 (two) times daily.   hydrocortisone 25 MG suppository Commonly known as: ANUSOL-HC Place 1 suppository (25 mg total) rectally at bedtime.   losartan 100 MG tablet Commonly known as: COZAAR TAKE 1 TABLET BY MOUTH EVERY DAY   pantoprazole 40 MG tablet Commonly known as: PROTONIX Take 1 tablet (40 mg total) by mouth daily.   polycarbophil 625 MG tablet Commonly known as: FIBERCON Take 1 tablet (625 mg total) by mouth daily. Start taking on: July 12, 2019   polyethylene glycol 17 g packet Commonly known as: MIRALAX / GLYCOLAX Take 17 g by mouth daily. Start taking on: July 12, 2019   polyvinyl alcohol 1.4 % ophthalmic solution Commonly known as: LIQUIFILM TEARS Place 1 drop into both eyes as needed for dry eyes.   Pramoxine HCl 1 % Crea Apply 1  application topically 3 (three) times daily. What changed:   when to take this  reasons to take this   simvastatin 20 MG tablet Commonly known as: ZOCOR TAKE 1 TABLET (20 MG TOTAL) BY MOUTH AT BEDTIME. What changed: See the new instructions.   Sitz Bath Misc 1 Device by Does not apply route at bedtime.      Follow-up Information    Willia Craze, NP Follow up on 07/22/2019.   Specialty: Gastroenterology Why: at 2pm. Please arrive 10 minutes early Contact information: [place] [address] [phone]          Allergies  Allergen Reactions  . Amoxicillin     REACTION: rash  . Codeine   . Lipitor [Atorvastatin]     Muscle pain, stiff joints  . Lisinopril   . Nitrofurantoin     Time coordinating discharge: Over 30 minutes   SIGNED:   Harold Hedge, D.O. [facility] Pager: [phone]  07/11/2019, 3:56 PM

## 2019-07-11 NOTE — Progress Notes (Signed)
Pt is being discharged home. Discharge instructions including follow up appointments and medications have been provided. Pt had no further questions at this time.

## 2019-07-11 NOTE — Progress Notes (Signed)
Occupational Therapy Treatment Patient Details Name: Christine Middleton MRN: [id number] DOB: 12/19/1922 Today's Date: 07/11/2019    History of present illness 85 year old female was admitted for rectal bleeding.  PMH:  HTN and CVA   OT comments  Performed ADL. Pt had difficulty reaching to feet for socks from higher commode.  Son lives with her but works and daughter comes to assist intermittently.  Pt not unsteady.  Encouraged her to use walk in shower with shower seat  Follow Up Recommendations  Supervision/Assistance - 24 hour    Equipment Recommendations  None recommended by OT    Recommendations for Other Services      Precautions / Restrictions Precautions Precautions: Fall Restrictions Weight Bearing Restrictions: No       Mobility Bed Mobility Overal bed mobility: Independent                Transfers   Equipment used: None   Sit to Stand: Supervision         General transfer comment: no LOB when standing for adls nor ambulating    Balance                                           ADL either performed or assessed with clinical judgement   ADL               Lower Body Bathing: Set up;Supervison/ safety       Lower Body Dressing: Set up;Minimal assistance   Toilet Transfer: Supervision/safety;Ambulation;Comfort height toilet;Grab bars             General ADL Comments: ambulated to bathroom and performed ADL.  Pt had difficulty reaching to feet from higher commode surface. She does have a walk in shower which she can use instead of tub. She also has a shower seat which can be added     Vision       Perception     Praxis      Cognition Arousal/Alertness: Awake/alert Behavior During Therapy: WFL for tasks assessed/performed Overall Cognitive Status: Within Functional Limits for tasks assessed                                          Exercises     Shoulder Instructions       General  Comments      Pertinent Vitals/ Pain       Pain Assessment: No/denies pain  Home Living                                          Prior Functioning/Environment              Frequency           Progress Toward Goals  OT Goals(current goals can now be found in the care plan section)  Progress towards OT goals: Progressing toward goals     Plan      Co-evaluation                 AM-PAC OT "6 Clicks" Daily Activity     Outcome Measure   Help from another person eating meals?: None Help from another person  taking care of personal grooming?: A Little Help from another person toileting, which includes using toliet, bedpan, or urinal?: A Little Help from another person bathing (including washing, rinsing, drying)?: A Little Help from another person to put on and taking off regular upper body clothing?: A Little Help from another person to put on and taking off regular lower body clothing?: A Little 6 Click Score: 19    End of Session    OT Visit Diagnosis: Muscle weakness (generalized) (M62.81)   Activity Tolerance Patient tolerated treatment well   Patient Left in chair;with call bell/phone within reach   Nurse Communication (still no alarm box in room)        Time: [id number] OT Time Calculation (min): 32 min  Charges: OT General Charges $OT Visit: 1 Visit OT Treatments $Self Care/Home Management : 23-37 mins  Joycelynn Fritsche S, OTR/L Acute Rehabilitation Services 07/11/2019   [place] 07/11/2019, 10:18 AM

## 2019-07-11 NOTE — Progress Notes (Addendum)
Progress Note    ASSESSMENT AND PLAN:   1. Acute on chronic painless rectal bleeding on Eliquis.  Bleeding most likely secondary to hemorrhoids found on colonoscopy.  See report below.  -Continue fibercon, colace and miralax.  -PCP prescribed hemorrhoidal cream but patient had just gotten from Pharmacy and not yet started it. I advised her to use the ointment.  -will follow up in office. See me in the office on 07/22/19 at 2pm. If continues to have problems will refer to Colorectal Surgery.  -ok from GI standpoint to resume anticoagulant tomorrow afternnoon   2. Mild [place] anemia secondary to #1. Hgb of 9.3, down from baseline of around 12.5. Not prescribing oral iron - too constipating.   3. PAF, home Eliquis is on hold. Currently on IV heparin    Colooscopy  Hemorrhoids found on digital rectal exam. - Diverticulosis in the distal rectum, in the recto-sigmoid colon and in the sigmoid colon. - Narrowing at 15 cm proximal to the anus. - One 5 mm polyp in the sigmoid colon, removed with a cold snare. Resected and retrieved. Clip (MR conditional) was placed. - Normal mucosa in the entire examined colon otherwise. - Mucosal wrent in the distal rectum. Clips (MR conditional) were placed to decrease risk of bleeding. - Recently bleeding non-thrombosed external and internal hemorrhoids. Impression: Not Applicable - Patient had care per Anesthesia. Moderate Sedation: - The patient will be observed post-procedure, until all discharge criteria are met. - Return patient to hospital ward for ongoing care. - Patient has a contact number available for emergencies. The signs and symptoms of potential delayed complications were discussed with the patient. Return to normal activities tomorrow. Written discharge instructions were provided to the patient. - High fiber diet. - Take Sitz baths QHS indefinitely. - Use FiberCon 1 tablet PO daily. - Colace 200 mg daily. - Miralax 1 capful (17  grams) in 8 ounces of water PO daily indefinitely. - Start Anusol suppositories QHS x 1 week and then QOD x 2 weeks. - May continue previous hemorrhoid cream prescribed by PCP. - Referral to Colorectal surgery to discuss options for hemorrhoid therapy if issues   SUBJECTIVE   Feels okay to day. No bleeding.   OBJECTIVE:     Vital signs in last 24 hours: Temp:  [97.5 F (36.4 C)-98.4 F (36.9 C)] 98.4 F (36.9 C) (12/11 0521) Pulse Rate:  [66-74] 73 (12/11 0521) Resp:  [16-27] 16 (12/11 0521) BP: (106-161)/(57-78) 106/64 (12/11 0521) SpO2:  [96 %-100 %] 96 % (12/11 0521) Weight:  [61.7 kg-63.7 kg] 61.7 kg (12/11 0500) Last BM Date: 07/10/19 General:   Alert, well-developed female in NAD EENT:  Normal hearing, non icteric sclera   Heart:  Regular rate and rhythm;  No lower extremity edema   Pulm: Normal respiratory effort   Abdomen:  Soft, nondistended, nontender.  Normal bowel sounds.          Neurologic:  Alert and  oriented x4;  grossly normal neurologically. Psych:  Pleasant, cooperative.  Normal mood and affect.   Intake/Output from previous day: 12/10 0701 - 12/11 0700 In: 1761.7 [P.O.:240; I.V.:1521.7] Out: 0  Intake/Output this shift: No intake/output data recorded.  Lab Results: Recent Labs    07/09/19 0605 07/10/19 0629 07/11/19 0636  WBC 8.3 7.8 8.5  HGB 10.5* 10.5* 9.3*  HCT 32.5* 33.1* 28.2*  PLT 232 236 201   BMET Recent Labs    07/09/19 0605 07/10/19 0629 07/11/19 0636  NA 142  145 139  K 3.8 3.8 3.5  CL 110 116* 108  CO2 24 20* 22  GLUCOSE 102* 116* 101*  BUN _0 CREATININE 0.83 0.88 0.80  CALCIUM 8.6* 8.7* 8.5*    Active Problems:   Rectal bleeding    LOS: 3 days   Tye Savoy ,NP 07/11/2019, 8:45 AM

## 2019-07-14 ENCOUNTER — Encounter: Payer: Self-pay | Admitting: Gastroenterology

## 2019-07-14 LAB — SURGICAL PATHOLOGY

## 2019-07-14 NOTE — Telephone Encounter (Signed)
Transition Care Management Follow-up Telephone Call  Date of discharge and from where: 07/11/2019 from [facility]  How have you been since you were released from the hospital? "I'm getting along alright". Patient denies any rectal bleeding since home from hospital.   Any questions or concerns? No   Items Reviewed:  Did the pt receive and understand the discharge instructions provided? Yes   Medications obtained and verified? Yes   Any new allergies since your discharge? No   Dietary orders reviewed? Yes  Do you have support at home? Yes , son and daughter  Other (ie: DME, Home Health, etc) none  Functional Questionnaire: (I = Independent and D = Dependent) ADL's: Patient is able to take care of herself mostly but daughter and son are available to help her when needed.  Bathing/Dressing- daughter can assist   Meal Prep- independent  Eating- independent  Maintaining continence- independent and daughter can assist if needed.  Transferring/Ambulation- independent  Managing Meds- daughter can assist   Follow up appointments reviewed:    PCP Hospital f/u appt confirmed? Apppointment with Dr. Sarajane Jews 07/16/19 3:15pm.  [facility] f/u appt confirmed? Yes  Scheduled to see GI 07/22/2019 2pm  Are transportation arrangements needed? No   If their condition worsens, is the pt aware to call  their PCP or go to the ED? Yes  Was the patient provided with contact information for the PCP's office or ED? Yes  Was the pt encouraged to call back with questions or concerns? Yes

## 2019-07-15 ENCOUNTER — Other Ambulatory Visit: Payer: Self-pay

## 2019-07-16 ENCOUNTER — Encounter: Payer: Self-pay | Admitting: Family Medicine

## 2019-07-16 ENCOUNTER — Ambulatory Visit (INDEPENDENT_AMBULATORY_CARE_PROVIDER_SITE_OTHER): Payer: Medicare Other | Admitting: Family Medicine

## 2019-07-16 VITALS — BP 130/70 | HR 75 | Temp 97.0°F | Wt 142.6 lb

## 2019-07-16 DIAGNOSIS — D649 Anemia, unspecified: Secondary | ICD-10-CM

## 2019-07-16 DIAGNOSIS — K5900 Constipation, unspecified: Secondary | ICD-10-CM | POA: Diagnosis not present

## 2019-07-16 DIAGNOSIS — K625 Hemorrhage of anus and rectum: Secondary | ICD-10-CM

## 2019-07-16 DIAGNOSIS — G609 Hereditary and idiopathic neuropathy, unspecified: Secondary | ICD-10-CM | POA: Diagnosis not present

## 2019-07-16 NOTE — Progress Notes (Signed)
   Subjective:    Patient ID: Christine Middleton, female    DOB: 28-Jan-1923, 85 y.o.   MRN: [id number]  HPI Here with her daughter to follow up a hospital stay from 07-08-19 to 07-11-19 for rectal bleeding. She had been having some perianal irritation and was using Pramoxine cream. Then in the day of admission she began to have free bleeding from the rectum. EMS took her to the ER where she was admitted. Her Hgb dropped a bit but was up to 10.8 at DC. She had a colonoscopy where internal and external hemorrhoids were found, as well as diverticula and a small mucosal tear. A rectal polyp was removed and this turned out to be a tubular adenoma. She was sent home on her usual Miralax, as well as a Fibercon a day, Colace, and hydrocortisone suppositories. She feels well today, her bowels are moving easily, and there is no pain or bleeding at the rectum.    Review of Systems  Constitutional: Negative.   Respiratory: Negative.   Cardiovascular: Negative.   Gastrointestinal: Positive for anal bleeding and constipation. Negative for abdominal distention, abdominal pain, blood in stool, diarrhea, nausea, rectal pain and vomiting.       Objective:   Physical Exam Constitutional:      General: She is not in acute distress.    Appearance: Normal appearance.  Cardiovascular:     Rate and Rhythm: Normal rate and regular rhythm.     Pulses: Normal pulses.     Heart sounds: Normal heart sounds.  Pulmonary:     Effort: Pulmonary effort is normal.     Breath sounds: Normal breath sounds.  Abdominal:     General: Abdomen is flat. Bowel sounds are normal. There is no distension.     Palpations: Abdomen is soft. There is no mass.     Tenderness: There is no abdominal tenderness. There is no guarding or rebound.     Hernia: No hernia is present.  Genitourinary:    Comments: The anal exam reveals a slight prolapse and several internal hemorrhoids just inside the verge. No active bleeding.  Neurological:   Mental Status: She is alert.           Assessment & Plan:  Bleeding hemorrhoids, resolved. She will continue to use Miralax and Fibercon daily. Drink plenty of water. Check the anemia with a CBC today. Recheck prn.  [name], MD

## 2019-07-17 LAB — CBC WITH DIFFERENTIAL/PLATELET
Basophils Absolute: 0.1 10*3/uL (ref 0.0–0.1)
Basophils Relative: 1.1 % (ref 0.0–3.0)
Eosinophils Absolute: 0.4 10*3/uL (ref 0.0–0.7)
Eosinophils Relative: 3.5 % (ref 0.0–5.0)
HCT: 33.7 % — ABNORMAL LOW (ref 36.0–46.0)
Hemoglobin: 11.2 g/dL — ABNORMAL LOW (ref 12.0–15.0)
Lymphocytes Relative: 16.7 % (ref 12.0–46.0)
Lymphs Abs: 1.7 10*3/uL (ref 0.7–4.0)
MCHC: 33.3 g/dL (ref 30.0–36.0)
MCV: 94.5 fl (ref 78.0–100.0)
Monocytes Absolute: 1.1 10*3/uL — ABNORMAL HIGH (ref 0.1–1.0)
Monocytes Relative: 10.6 % (ref 3.0–12.0)
Neutro Abs: 7 10*3/uL (ref 1.4–7.7)
Neutrophils Relative %: 68.1 % (ref 43.0–77.0)
Platelets: 273 10*3/uL (ref 150.0–400.0)
RBC: 3.56 Mil/uL — ABNORMAL LOW (ref 3.87–5.11)
RDW: 13.5 % (ref 11.5–15.5)
WBC: 10.3 10*3/uL (ref 4.0–10.5)

## 2019-07-22 ENCOUNTER — Ambulatory Visit: Payer: Medicare Other | Admitting: Gastroenterology

## 2019-08-22 ENCOUNTER — Other Ambulatory Visit: Payer: Self-pay | Admitting: Family Medicine

## 2019-08-22 NOTE — Telephone Encounter (Signed)
MEDICATION: simvastatin (ZOCOR) 20 MG tablet  PHARMACY: [id number] - [place], [place] - [address]  Comments: patient is completely out  **Let patient know to contact pharmacy at the end of the day to make sure medication is ready. **  ** Please notify patient to allow 48-72 hours to process**  **Encourage patient to contact the pharmacy for refills or they can request refills through [facility]**

## 2019-08-22 NOTE — Telephone Encounter (Signed)
Last filled in 2019 Last OV 07/16/2019  Should patient continue this medication?

## 2019-09-24 ENCOUNTER — Other Ambulatory Visit: Payer: Self-pay | Admitting: Cardiology

## 2019-09-24 NOTE — Telephone Encounter (Signed)
11f 64.7kg Scr 0.8 (07/11/19) Lovw/jordan 09/26/18 Pt requesting 2.5mg  eliquis but qualifies for 5mg  based on dosing criteria wt 64.7kg is above 60kg and 0.8 scr is less than 1.5. Will route to pharmd to see if a change is needed.

## 2019-09-25 NOTE — Telephone Encounter (Signed)
"  Will continue Eliquis at 2.5 mg bid based on age and reduced body weight" per Dr Martinique

## 2019-10-10 ENCOUNTER — Ambulatory Visit: Payer: Medicare Other | Admitting: Family Medicine

## 2019-10-23 ENCOUNTER — Other Ambulatory Visit: Payer: Self-pay | Admitting: Family Medicine

## 2019-11-06 ENCOUNTER — Other Ambulatory Visit: Payer: Self-pay

## 2019-11-07 ENCOUNTER — Encounter: Payer: Self-pay | Admitting: Family Medicine

## 2019-11-07 ENCOUNTER — Ambulatory Visit (INDEPENDENT_AMBULATORY_CARE_PROVIDER_SITE_OTHER): Payer: Medicare Other | Admitting: Family Medicine

## 2019-11-07 VITALS — BP 140/64 | HR 75 | Temp 97.7°F | Ht 62.0 in | Wt 139.8 lb

## 2019-11-07 DIAGNOSIS — Z0000 Encounter for general adult medical examination without abnormal findings: Secondary | ICD-10-CM

## 2019-11-07 DIAGNOSIS — R739 Hyperglycemia, unspecified: Secondary | ICD-10-CM

## 2019-11-07 LAB — BASIC METABOLIC PANEL
BUN: 18 mg/dL (ref 6–23)
CO2: 28 mEq/L (ref 19–32)
Calcium: 9.4 mg/dL (ref 8.4–10.5)
Chloride: 103 mEq/L (ref 96–112)
Creatinine, Ser: 0.99 mg/dL (ref 0.40–1.20)
GFR: 52.09 mL/min — ABNORMAL LOW (ref >60.00)
Glucose, Bld: 113 mg/dL — ABNORMAL HIGH (ref 70–99)
Potassium: 4.3 mEq/L (ref 3.5–5.1)
Sodium: 141 mEq/L (ref 135–145)

## 2019-11-07 LAB — CBC WITH DIFFERENTIAL/PLATELET
Basophils Absolute: 0.1 10*3/uL (ref 0.0–0.1)
Basophils Relative: 1.1 % (ref 0.0–3.0)
Eosinophils Absolute: 0.3 10*3/uL (ref 0.0–0.7)
Eosinophils Relative: 3.6 % (ref 0.0–5.0)
HCT: 36.7 % (ref 36.0–46.0)
Hemoglobin: 12.4 g/dL (ref 12.0–15.0)
Lymphocytes Relative: 18.9 % (ref 12.0–46.0)
Lymphs Abs: 1.5 10*3/uL (ref 0.7–4.0)
MCHC: 33.8 g/dL (ref 30.0–36.0)
MCV: 92.8 fl (ref 78.0–100.0)
Monocytes Absolute: 0.8 10*3/uL (ref 0.1–1.0)
Monocytes Relative: 11 % (ref 3.0–12.0)
Neutro Abs: 5.1 10*3/uL (ref 1.4–7.7)
Neutrophils Relative %: 65.4 % (ref 43.0–77.0)
Platelets: 238 10*3/uL (ref 150.0–400.0)
RBC: 3.95 Mil/uL (ref 3.87–5.11)
RDW: 13 % (ref 11.5–15.5)
WBC: 7.7 10*3/uL (ref 4.0–10.5)

## 2019-11-07 LAB — LIPID PANEL
Cholesterol: 201 mg/dL — ABNORMAL HIGH (ref 0–200)
HDL: 85.8 mg/dL (ref >39.00)
LDL Cholesterol: 95 mg/dL (ref 0–99)
NonHDL: 115.15
Total CHOL/HDL Ratio: 2
Triglycerides: 103 mg/dL (ref 0.0–149.0)
VLDL: 20.6 mg/dL (ref 0.0–40.0)

## 2019-11-07 LAB — HEPATIC FUNCTION PANEL
ALT: 10 U/L (ref 0–35)
AST: 14 U/L (ref 0–37)
Albumin: 4.2 g/dL (ref 3.5–5.2)
Alkaline Phosphatase: 92 U/L (ref 39–117)
Bilirubin, Direct: 0.2 mg/dL (ref 0.0–0.3)
Total Bilirubin: 0.8 mg/dL (ref 0.2–1.2)
Total Protein: 6.8 g/dL (ref 6.0–8.3)

## 2019-11-07 LAB — TSH: TSH: 1.35 u[IU]/mL (ref 0.35–4.50)

## 2019-11-07 LAB — HEMOGLOBIN A1C: Hgb A1c MFr Bld: 5.9 % (ref 4.6–6.5)

## 2019-11-07 MED ORDER — PANTOPRAZOLE SODIUM 40 MG PO TBEC: 40.0000 mg | DELAYED_RELEASE_TABLET | Freq: Every day | ORAL | 3 refills | Status: DC

## 2019-11-07 NOTE — Progress Notes (Signed)
   Subjective:    Patient ID: Samuel Jester Borge, female    DOB: 10/30/1922, 86 y.o.   MRN: [id number]  HPI Here with her daughter for a well exam. She is doing well in general. She still has occasional light rectal bleeding, but using Miralax every day has helped. Her HTN and GERD are well controlled.    Review of Systems  Constitutional: Negative.   HENT: Positive for hearing loss.   Eyes: Negative.   Respiratory: Negative.   Cardiovascular: Negative.   Gastrointestinal: Positive for anal bleeding.  Genitourinary: Negative for decreased urine volume, difficulty urinating, dyspareunia, dysuria, enuresis, flank pain, frequency, hematuria, pelvic pain and urgency.  Musculoskeletal: Negative.   Skin: Negative.   Neurological: Negative.   Psychiatric/Behavioral: Negative.        Objective:   Physical Exam Constitutional:      General: She is not in acute distress.    Appearance: Normal appearance. She is well-developed.  HENT:     Head: Normocephalic and atraumatic.     Right Ear: External ear normal.     Left Ear: External ear normal.     Nose: Nose normal.     Mouth/Throat:     Pharynx: No oropharyngeal exudate.  Eyes:     General: No scleral icterus.    Conjunctiva/sclera: Conjunctivae normal.     Pupils: Pupils are equal, round, and reactive to light.  Neck:     Thyroid: No thyromegaly.     Vascular: No JVD.  Cardiovascular:     Rate and Rhythm: Normal rate and regular rhythm.     Heart sounds: Normal heart sounds. No murmur. No friction rub. No gallop.   Pulmonary:     Effort: Pulmonary effort is normal. No respiratory distress.     Breath sounds: Normal breath sounds. No wheezing or rales.  Chest:     Chest wall: No tenderness.  Abdominal:     General: Bowel sounds are normal. There is no distension.     Palpations: Abdomen is soft. There is no mass.     Tenderness: There is no abdominal tenderness. There is no guarding or rebound.  Musculoskeletal:        General:  No tenderness. Normal range of motion.     Cervical back: Normal range of motion and neck supple.  Lymphadenopathy:     Cervical: No cervical adenopathy.  Skin:    General: Skin is warm and dry.     Findings: No erythema or rash.  Neurological:     Mental Status: She is alert and oriented to person, place, and time.     Cranial Nerves: No cranial nerve deficit.     Motor: No abnormal muscle tone.     Coordination: Coordination normal.     Deep Tendon Reflexes: Reflexes are normal and symmetric. Reflexes normal.  Psychiatric:        Behavior: Behavior normal.        Thought Content: Thought content normal.        Judgment: Judgment normal.           Assessment & Plan:  Well exam. We discussed diet and exercise. Get fasting labs.  [name], MD

## 2019-11-26 ENCOUNTER — Ambulatory Visit: Payer: Medicare Other | Admitting: Physician Assistant

## 2019-11-26 ENCOUNTER — Other Ambulatory Visit: Payer: Self-pay

## 2019-11-26 ENCOUNTER — Encounter: Payer: Self-pay | Admitting: Physician Assistant

## 2019-11-26 VITALS — BP 146/68 | HR 74 | Wt 139.2 lb

## 2019-11-26 DIAGNOSIS — I480 Paroxysmal atrial fibrillation: Secondary | ICD-10-CM

## 2019-11-26 DIAGNOSIS — Z8673 Personal history of transient ischemic attack (TIA), and cerebral infarction without residual deficits: Secondary | ICD-10-CM | POA: Diagnosis not present

## 2019-11-26 DIAGNOSIS — I10 Essential (primary) hypertension: Secondary | ICD-10-CM | POA: Diagnosis not present

## 2019-11-26 DIAGNOSIS — E785 Hyperlipidemia, unspecified: Secondary | ICD-10-CM

## 2019-11-26 NOTE — Patient Instructions (Addendum)
Medication Instructions:   Move Losartan to evening/night time  *If you need a refill on your cardiac medications before your next appointment, please call your pharmacy*  Lab Work: NONE ordered at this time of appointment   If you have labs (blood work) drawn today and your tests are completely normal, you will receive your results only by: Marland Kitchen MyChart Message (if you have MyChart) OR . A paper copy in the mail If you have any lab test that is abnormal or we need to change your treatment, we will call you to review the results.  Testing/Procedures: NONE ordered at this time of appointment   Follow-Up: At [facility], you and your health needs are our priority.  As part of our continuing mission to provide you with exceptional heart care, we have created designated Provider Care Teams.  These Care Teams include your primary Cardiologist (physician) and Advanced Practice Providers (APPs -  Physician Assistants and Nurse Practitioners) who all work together to provide you with the care you need, when you need it.   Your next appointment:   1 year(s)  The format for your next appointment:   In Person  Provider:   Peter Martinique, MD  Other Instructions

## 2019-11-26 NOTE — Progress Notes (Signed)
Cardiology Office Note:    Date:  11/28/2019   ID:  Christine Middleton, DOB 28-Oct-1922, MRN [id number]  PCP:  Laurey Morale, MD  Cardiologist:  Peter Martinique, MD  Electrophysiologist:  None   Referring MD: Laurey Morale, MD   Chief Complaint  Patient presents with  . Follow-up    seen for Dr. Martinique.    History of Present Illness:    Christine Middleton is a 86 y.o. female with a hx of HTN, HLD, CVA and rate controlled afib.  Patient was admitted in January 2018 with CVA along with left facial droop.  Echocardiogram obtained on 08/18/2016 showed EF of 60 to 65%, no regional wall motion abnormality, grade 2 DD.  Carotid Doppler in January 2018 was negative for significant disease.  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  Cardiology service was consulted for TEE plus loop recorder, however patient declined this.  She was discharged on Plavix therapy.  However on subsequent follow-up with Dr. Sarajane Jews, she was noted to be in A. fib with controlled heart rate.  She was anticoagulated with Eliquis.  She was last admitted in December 2020 due to some bleeding hemorrhoid.  Colonoscopy also revealed diverticulosis in the rectum and a 5 mm polyp which was removed.  Eliquis was held during perioperative period however restarted 2 days after the colonoscopy.  Patient presents today for cardiology office visit.  She denies any chest pain or palpitation.  EKG shows she is maintaining sinus rhythm.  She takes losartan, amlodipine and atenolol in the morning.  I recommended move the losartan to nighttime to allow more even blood pressure during the day.  She does have burning sensation in her legs and chronically more swollen left lower extremity by the end of the day.  I suspect this is related to venous insufficiencies.  She denies any claudication symptoms during exertion.  Otherwise she can follow-up with Dr. Martinique in 1 year.   Past  Medical History:  Diagnosis Date  . Diverticulitis   . Esophageal stricture   . GERD (gastroesophageal reflux disease)   . Hyperlipidemia   . Hypertension   . Shingles   . Stroke ([place])   . Vitamin B 12 deficiency     Past Surgical History:  Procedure Laterality Date  . ABDOMINAL HYSTERECTOMY    . APPENDECTOMY    . COLONOSCOPY  12-09-08   per Dr. Sharlett Iles (incomplete along with barium enema 12-10-08) with hemorrhoids  and diverticulae  . COLONOSCOPY WITH PROPOFOL N/A 07/10/2019   Procedure: COLONOSCOPY WITH PROPOFOL;  Surgeon: Rush Landmark Telford Nab., MD;  Location: [facility];  Service: Gastroenterology;  Laterality: N/A;  . ESOPHAGOGASTRODUODENOSCOPY  09-2004   per Dr. Sharlett Iles with dilatation   . EYE SURGERY    . HEMOSTASIS CLIP PLACEMENT  07/10/2019   Procedure: HEMOSTASIS CLIP PLACEMENT;  Surgeon: Irving Copas., MD;  Location: [facility];  Service: Gastroenterology;;  . POLYPECTOMY  07/10/2019   Procedure: POLYPECTOMY;  Surgeon: Irving Copas., MD;  Location: [facility];  Service: Gastroenterology;;  . TONSILLECTOMY      Current Medications: Current Meds  Medication Sig  . acetaminophen (TYLENOL) 325 MG tablet Take 325 mg by mouth every 6 (six) hours as needed for mild pain or headache.  Marland Kitchen amLODipine (NORVASC) 5 MG tablet TAKE 1 TABLET BY MOUTH EVERY DAY  . atenolol (TENORMIN) 50 MG tablet TAKE 2 TABLETS BY MOUTH EVERY DAY  .  ELIQUIS 2.5 MG TABS tablet TAKE 1 TABLET BY MOUTH TWICE A DAY  . losartan (COZAAR) 100 MG tablet TAKE 1 TABLET BY MOUTH EVERY DAY  . pantoprazole (PROTONIX) 40 MG tablet Take 1 tablet (40 mg total) by mouth daily.  . polyethylene glycol (MIRALAX / GLYCOLAX) 17 g packet Take 17 g by mouth daily.  . simvastatin (ZOCOR) 20 MG tablet TAKE 1 TABLET BY MOUTH EVERYDAY AT BEDTIME     Allergies:   Amoxicillin, Codeine, Lipitor [atorvastatin], Lisinopril, and Nitrofurantoin   Social History   Socioeconomic History  . Marital  status: Married    Spouse name: Grayling Congress. Hyun  . Number of children: 4  . Years of education: HS  . Highest education level: Not on file  Occupational History  . Occupation: Retired    Fish farm manager: RETIRED  Tobacco Use  . Smoking status: Never Smoker  . Smokeless tobacco: Never Used  Substance and Sexual Activity  . Alcohol use: No    Alcohol/week: 0.0 standard drinks  . Drug use: No  . Sexual activity: Not on file  Other Topics Concern  . Not on file  Social History Narrative   Patient lives at home with family.   Patient is right handed.   Patient has a high school education.   Caffeine Use: 1 cup daily   Social Determinants of Health   Financial Resource Strain:   . Difficulty of Paying Living Expenses:   Food Insecurity:   . Worried About Charity fundraiser in the Last Year:   . Arboriculturist in the Last Year:   Transportation Needs:   . Film/video editor (Medical):   Marland Kitchen Lack of Transportation (Non-Medical):   Physical Activity:   . Days of Exercise per Week:   . Minutes of Exercise per Session:   Stress:   . Feeling of Stress :   Social Connections:   . Frequency of Communication with Friends and Family:   . Frequency of Social Gatherings with Friends and Family:   . Attends Religious Services:   . Active Member of Clubs or Organizations:   . Attends Archivist Meetings:   Marland Kitchen Marital Status:      Family History: The patient's family history includes Breast cancer in an other family member; Cancer in her brother and brother; Diabetes in her brother and another family member; Heart attack in her mother; Heart disease in her father and another family member; Hypertension in an other family member; Stroke in her sister and another family member.  ROS:   Please see the history of present illness.     All other systems reviewed and are negative.  EKGs/Labs/Other Studies Reviewed:    The following studies were reviewed today:  Echo 08/18/2016 LV  EF: 60% -  65%   -------------------------------------------------------------------  Indications:   CVA 436.   -------------------------------------------------------------------  History:  Risk factors: Hypertension. Dyslipidemia.   -------------------------------------------------------------------  Study Conclusions   - Left ventricle: The cavity size was normal. Wall thickness was  normal. Systolic function was normal. The estimated ejection  fraction was in the range of 60% to 65%. Wall motion was normal;  there were no regional wall motion abnormalities. Features are  consistent with a pseudonormal left ventricular filling pattern,  with concomitant abnormal relaxation and increased filling  pressure (grade 2 diastolic dysfunction).  - Aortic valve: Mildly calcified annulus. Trileaflet; mildly  thickened, mildly calcified leaflets. There was mild  regurgitation.  - Mitral valve: There  was moderate regurgitation.   Impressions:   - No cardiac source of emboli was indentified.   EKG:  EKG is ordered today.  The ekg ordered today demonstrates normal sinus rhythm, no significant ST-T wave changes.  Recent Labs: 11/07/2019: ALT 10; BUN 18; Creatinine, Ser 0.99; Hemoglobin 12.4; Platelets 238.0; Potassium 4.3; Sodium 141; TSH 1.35  Recent Lipid Panel    Component Value Date/Time   CHOL 201 (H) 11/07/2019 1045   TRIG 103.0 11/07/2019 1045   TRIG 92 05/28/2006 1104   HDL 85.80 11/07/2019 1045   CHOLHDL 2 11/07/2019 1045   VLDL 20.6 11/07/2019 1045   LDLCALC 95 11/07/2019 1045   LDLDIRECT 113.3 07/18/2012 1113    Physical Exam:    VS:  BP (!) 146/68   Pulse 74   Wt 139 lb 3.2 oz (63.1 kg)   SpO2 97%   BMI 25.46 kg/m     Wt Readings from Last 3 Encounters:  11/26/19 139 lb 3.2 oz (63.1 kg)  11/07/19 139 lb 12.8 oz (63.4 kg)  07/16/19 142 lb 9.6 oz (64.7 kg)     GEN:  Well nourished, well developed in no acute distress HEENT: Normal NECK:  No JVD; No carotid bruits LYMPHATICS: No lymphadenopathy CARDIAC: RRR, no murmurs, rubs, gallops RESPIRATORY:  Clear to auscultation without rales, wheezing or rhonchi  ABDOMEN: Soft, non-tender, non-distended MUSCULOSKELETAL:  No edema; No deformity  SKIN: Warm and dry NEUROLOGIC:  Alert and oriented x 3 PSYCHIATRIC:  Normal affect   ASSESSMENT:    1. Paroxysmal atrial fibrillation (HCC)   2. Essential hypertension   3. Hyperlipidemia LDL goal <100   4. H/O: CVA (cerebrovascular accident)    PLAN:    In order of problems listed above:  1. Paroxysmal atrial fibrillation: On atenolol and Eliquis.  Maintaining sinus rhythm.  2. Hypertension: Blood pressure well controlled  3. Hyperlipidemia: Continue simvastatin  4. History of CVA: No recurrence.   Medication Adjustments/Labs and Tests Ordered: Current medicines are reviewed at length with the patient today.  Concerns regarding medicines are outlined above.  Orders Placed This Encounter  Procedures  . EKG 12-Lead   No orders of the defined types were placed in this encounter.   Patient Instructions  Medication Instructions:   Move Losartan to evening/night time  *If you need a refill on your cardiac medications before your next appointment, please call your pharmacy*  Lab Work: NONE ordered at this time of appointment   If you have labs (blood work) drawn today and your tests are completely normal, you will receive your results only by: Marland Kitchen MyChart Message (if you have MyChart) OR . A paper copy in the mail If you have any lab test that is abnormal or we need to change your treatment, we will call you to review the results.  Testing/Procedures: NONE ordered at this time of appointment   Follow-Up: At [facility], you and your health needs are our priority.  As part of our continuing mission to provide you with exceptional heart care, we have created designated Provider Care Teams.  These Care Teams include  your primary Cardiologist (physician) and Advanced Practice Providers (APPs -  Physician Assistants and Nurse Practitioners) who all work together to provide you with the care you need, when you need it.   Your next appointment:   1 year(s)  The format for your next appointment:   In Person  Provider:   Peter Martinique, MD  Other Instructions  Hilbert Corrigan, [place]  11/28/2019 11:24 PM     [facility]

## 2019-11-28 ENCOUNTER — Encounter: Payer: Self-pay | Admitting: Physician Assistant

## 2020-01-21 ENCOUNTER — Telehealth: Payer: Self-pay | Admitting: Family Medicine

## 2020-01-21 NOTE — Progress Notes (Signed)
  Chronic Care Management   Note  01/21/2020 Name: Christine Middleton MRN: [id number] DOB: 03-15-1923  Christine Middleton is a 86 y.o. year old female who is a primary care patient of Laurey Morale, MD. I reached out to Paia by phone today in response to a referral sent by Christine Middleton PCP, Laurey Morale, MD.   Christine Middleton was given information about Chronic Care Management services today including:  1. CCM service includes personalized support from designated clinical staff supervised by her physician, including individualized plan of care and coordination with other care providers 2. 24/7 contact phone numbers for assistance for urgent and routine care needs. 3. Service will only be billed when office clinical staff spend 20 minutes or more in a month to coordinate care. 4. Only one practitioner may furnish and bill the service in a calendar month. 5. The patient may stop CCM services at any time (effective at the end of the month) by phone call to the office staff.   Patient agreed to services and verbal consent obtained.   Follow up plan:   [place]

## 2020-03-17 DIAGNOSIS — H353132 Nonexudative age-related macular degeneration, bilateral, intermediate dry stage: Secondary | ICD-10-CM | POA: Diagnosis not present

## 2020-03-17 DIAGNOSIS — H04123 Dry eye syndrome of bilateral lacrimal glands: Secondary | ICD-10-CM | POA: Diagnosis not present

## 2020-03-17 DIAGNOSIS — H10413 Chronic giant papillary conjunctivitis, bilateral: Secondary | ICD-10-CM | POA: Diagnosis not present

## 2020-03-17 DIAGNOSIS — Z961 Presence of intraocular lens: Secondary | ICD-10-CM | POA: Diagnosis not present

## 2020-03-23 ENCOUNTER — Telehealth: Payer: Self-pay

## 2020-03-23 ENCOUNTER — Telehealth: Payer: Self-pay | Admitting: Family Medicine

## 2020-03-23 DIAGNOSIS — E782 Mixed hyperlipidemia: Secondary | ICD-10-CM

## 2020-03-23 DIAGNOSIS — I6350 Cerebral infarction due to unspecified occlusion or stenosis of unspecified cerebral artery: Secondary | ICD-10-CM

## 2020-03-23 NOTE — Telephone Encounter (Signed)
-----   Message from Germaine Pomfret, [facility] sent at 03/19/2020  1:23 PM EDT ----- Regarding: CCM Referral Dr. Sarajane Jews,  Can you please place a referral to chronic care management for this patient?  Thanks, Doristine Section Clinical Pharmacist [facility] [place]

## 2020-03-23 NOTE — Progress Notes (Signed)
Chronic Care Management Pharmacy Assistant   Name: Christine Middleton  MRN: [id number] DOB: [birth year]  Reason for Encounter: Medication Review / Initial question for initial visit with clinical pharmacist  .   PCP : Laurey Morale, MD  Allergies:   Allergies  Allergen Reactions  . Amoxicillin     REACTION: rash  . Codeine   . Lipitor [Atorvastatin]     Muscle pain, stiff joints  . Lisinopril   . Nitrofurantoin     Medications: Outpatient Encounter Medications as of 03/23/2020  Medication Sig  . acetaminophen (TYLENOL) 325 MG tablet Take 325 mg by mouth every 6 (six) hours as needed for mild pain or headache.  Marland Kitchen amLODipine (NORVASC) 5 MG tablet TAKE 1 TABLET BY MOUTH EVERY DAY  . atenolol (TENORMIN) 50 MG tablet TAKE 2 TABLETS BY MOUTH EVERY DAY  . docusate sodium (COLACE) 100 MG capsule Take 1 capsule (100 mg total) by mouth 2 (two) times daily.  Marland Kitchen ELIQUIS 2.5 MG TABS tablet TAKE 1 TABLET BY MOUTH TWICE A DAY  . losartan (COZAAR) 100 MG tablet TAKE 1 TABLET BY MOUTH EVERY DAY  . pantoprazole (PROTONIX) 40 MG tablet Take 1 tablet (40 mg total) by mouth daily.  . polyethylene glycol (MIRALAX / GLYCOLAX) 17 g packet Take 17 g by mouth daily.  . polyvinyl alcohol (LIQUIFILM TEARS) 1.4 % ophthalmic solution Place 1 drop into both eyes as needed for dry eyes.  . simvastatin (ZOCOR) 20 MG tablet TAKE 1 TABLET BY MOUTH EVERYDAY AT BEDTIME   No facility-administered encounter medications on file as of 03/23/2020.    Current Diagnosis: Patient Active Problem List   Diagnosis Date Noted  . Rectal bleeding 07/08/2019  . Depression with anxiety 09/26/2017  . Hypertension 01/17/2017  . Pedal edema 10/18/2016  . TMJ (dislocation of temporomandibular joint) 09/18/2016  . 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  . Cerebrovascular accident (CVA) ([place])   . Embolic stroke ([place]) 38/25/0539  . Facial droop 08/18/2016  . Cerebral infarction ([place]) 10/04/2013  . HYPERGLYCEMIA 06/30/2010  . Cerebral artery  occlusion with cerebral infarction ([place]) 07/19/2009  . Disorder resulting from impaired renal function 07/19/2009  . Polymyalgia rheumatica ([place]) 07/19/2009  . VITAMIN B12 DEFICIENCY 06/30/2009  . Anxiety state 06/28/2009  . Hereditary and idiopathic peripheral neuropathy 06/28/2009  . External hemorrhoids 12/03/2008  . ESOPHAGEAL STRICTURE 11/30/2008  . DIVERTICULOSIS, COLON 11/30/2008  . GROSS HEMATURIA 11/04/2008  . MYALGIA 11/04/2008  . Constipation 06/24/2008  . ACUTE CYSTITIS 03/12/2008  . HLD (hyperlipidemia) 05/30/2007  . SKIN LESION 05/30/2007  . Essential hypertension 03/19/2007  . GERD 03/19/2007    Goals Addressed   None     Follow-Up:  Pharmacist Review   Have you seen any other providers since your last visit?*no   Any changes in your medications or health? No  Patient states the time of day she takes her medication   Amlodpine 5 MG Tablet Daily After Breakfast   Atenolol 50 MG Two Tablet Daily After Breakfast   Eliquis 2.5 MG  Tablet  Twice Daily Bedtime   Losartan 100 MG tablet Daily Bedtime   Pantoprazole 40 MG Tablet Daily Before Breakfast   Simvastatin 20 MG Tablet Daily Bedtime  Any side effects from any medications? no Do you have an symptoms or problems not managed by your medications? no Any concerns about your health right now?   Patient states she has concerns about her hearing. Has your provider asked that you check blood pressure, blood  sugar, or follow special diet at home? None ID Do you get any type of exercise on a regular basis? None ID Can you think of a goal you would like to reach for your health? None ID Do you have any problems getting your medications? no Is there anything that you would like to discuss during the appointment? None ID   Please bring medications and supplements to appointment  [place] Pharmacist Assistant [phone]

## 2020-03-24 ENCOUNTER — Ambulatory Visit: Payer: Medicare Other

## 2020-03-24 ENCOUNTER — Other Ambulatory Visit: Payer: Self-pay

## 2020-03-24 DIAGNOSIS — I10 Essential (primary) hypertension: Secondary | ICD-10-CM

## 2020-03-24 DIAGNOSIS — E782 Mixed hyperlipidemia: Secondary | ICD-10-CM

## 2020-03-24 NOTE — Chronic Care Management (AMB) (Signed)
Chronic Care Management Pharmacy  Name: Seraphine Gudiel Enns  MRN: [id number] DOB: April 09, 1923   Chief Complaint/ HPI  Elliot B Hogate,  86 y.o. , female presents for their Initial CCM visit with the clinical pharmacist In office.  PCP : Laurey Morale, MD Patient Care Team: Laurey Morale, MD as PCP - General Martinique, Peter M, MD as PCP - Cardiology (Cardiology) Germaine Pomfret, [facility] as Pharmacist (Pharmacist)  Their chronic conditions include: Hypertension, Hyperlipidemia, Atrial Fibrillation, GERD, Depression and Anxiety   Office Visits: 11/07/19: Patient presented to Dr. Sarajane Jews for follow-up.   Consult Visit: 11/26/19: Patient presented to Almyra Deforest, PA (Cardiology) for follow-up. Pramoxine stopped (patient not taking)   Allergies  Allergen Reactions  . Amoxicillin     REACTION: rash  . Codeine   . Lipitor [Atorvastatin]     Muscle pain, stiff joints  . Lisinopril   . Nitrofurantoin     Medications: Outpatient Encounter Medications as of 03/24/2020  Medication Sig  . acetaminophen (TYLENOL) 325 MG tablet Take 325 mg by mouth every 6 (six) hours as needed for mild pain or headache.  Marland Kitchen amLODipine (NORVASC) 5 MG tablet TAKE 1 TABLET BY MOUTH EVERY DAY  . atenolol (TENORMIN) 50 MG tablet TAKE 2 TABLETS BY MOUTH EVERY DAY  . ELIQUIS 2.5 MG TABS tablet TAKE 1 TABLET BY MOUTH TWICE A DAY  . losartan (COZAAR) 100 MG tablet TAKE 1 TABLET BY MOUTH EVERY DAY  . pantoprazole (PROTONIX) 40 MG tablet Take 1 tablet (40 mg total) by mouth daily.  . polycarbophil (FIBERCON) 625 MG tablet Take 625 mg by mouth daily.  . polyethylene glycol (MIRALAX / GLYCOLAX) 17 g packet Take 17 g by mouth daily.  . simvastatin (ZOCOR) 20 MG tablet TAKE 1 TABLET BY MOUTH EVERYDAY AT BEDTIME  . docusate sodium (COLACE) 100 MG capsule Take 1 capsule (100 mg total) by mouth 2 (two) times daily.  . polyvinyl alcohol (LIQUIFILM TEARS) 1.4 % ophthalmic solution Place 1 drop into both eyes as needed for dry eyes.   No  facility-administered encounter medications on file as of 03/24/2020.   Current Diagnosis/Assessment:  SDOH Interventions     Most Recent Value  SDOH Interventions  Financial Strain Interventions Intervention Not Indicated  Transportation Interventions Intervention Not Indicated      Goals Addressed            This Visit's Progress   . Chronic Care Management       CARE PLAN ENTRY (see longitudinal plan of care for additional care plan information)  Current Barriers:  . Chronic Disease Management support, education, and care coordination needs related to Hypertension, Hyperlipidemia, Atrial Fibrillation, GERD, Depression and Anxiety    Hypertension BP Readings from Last 3 Encounters:  11/26/19 (!) 146/68  11/07/19 140/64  07/16/19 130/70   . Pharmacist Clinical Goal(s): o Over the next 90 days, patient will work with PharmD and providers to maintain BP goal <140/90 . Current regimen:  . Amlodipine 5 mg daily  . Atenolol 50 mg 2 tablets daily  . Losartan 100 mg daily  . Interventions: o Discussed low salt diet and exercising as tolerated extensively . Patient self care activities - Over the next 90 days, patient will: o Check blood pressure 1-2 times monthly, document, and provide at future appointments o Ensure daily salt intake < 2300 mg/day  Hyperlipidemia Lab Results  Component Value Date/Time   LDLCALC 95 11/07/2019 10:45 AM   LDLDIRECT 113.3 07/18/2012 11:13 AM   .  Pharmacist Clinical Goal(s): o Over the next 90 days, patient will work with PharmD and providers to maintain LDL goal < 100 . Current regimen:  o Simvastatin 20 mg nightly . Interventions: o Discussed low cholesterol diet and exercising as tolerated extensively  Medication management . Pharmacist Clinical Goal(s): o Over the next 90 days, patient will work with PharmD and providers to maintain optimal medication adherence . [facility] . Interventions o Comprehensive medication  review performed. o Continue current medication management strategy . Patient self care activities - Over the next 90 days, patient will: o Take medications as prescribed o Report any questions or concerns to PharmD and/or provider(s)      AFIB   Managed by Dr. Martinique   Patient is currently rate controlled.  Patient has failed these meds in past: n/a Patient is currently controlled on the following medications:  . Eliquis 2.5 mg BID . Atenolol 50 mg 2 tablets daily  We discussed: Patient not on weight-based dose for apixaban but approved by Dr. Martinique. Patient has history of bleeding hemorrhoids (hospitalized Dec 2020), but much improved. Patient denies any other signs of bleeding.   Plan  Continue current medications  Hypertension   BP goal is:  <140/90  Office blood pressures are  BP Readings from Last 3 Encounters:  11/26/19 (!) 146/68  11/07/19 140/64  07/16/19 130/70   CMP Latest Ref Rng & Units 11/07/2019 07/11/2019 07/10/2019  Glucose 70 - 99 mg/dL 113(H) 101(H) 116(H)  BUN 6 - 23 mg/dL '18 12 9  ' Creatinine 0.40 - 1.20 mg/dL 0.99 0.80 0.88  Sodium 135 - 145 mEq/L 141 139 145  Potassium 3.5 - 5.1 mEq/L 4.3 3.5 3.8  Chloride 96 - 112 mEq/L 103 108 116(H)  CO2 19 - 32 mEq/L 28 22 20(L)  Calcium 8.4 - 10.5 mg/dL 9.4 8.5(L) 8.7(L)  Total Protein 6.0 - 8.3 g/dL 6.8 - -  Total Bilirubin 0.2 - 1.2 mg/dL 0.8 - -  Alkaline Phos 39 - 117 U/L 92 - -  AST 0 - 37 U/L 14 - -  ALT 0 - 35 U/L 10 - -   Patient checks BP at home when feeling symptomatic Patient home BP readings are ranging: Systolic range [id number]   Patient has failed these meds in the past: n/a Patient is currently controlled on the following medications:  . Amlodipine 5 mg daily  . Atenolol 50 mg 2 tablets daily  . Losartan 100 mg daily   We discussed: Patient with occasional dizziness, she checked once during one of these instances and reported a systolic of 751. Counseled patient to be careful during  postural changes, salt avoidance, and to stay hydrated. Patient also reports some mild peripheral edema which she feels is manageable. She does not like using compression stockings.  Advised her to elevate her legs above her body in the evenings.   Plan  Continue current medications   Hyperlipidemia   History of CVA 2018.   LDL goal < 100  Lipid Panel     Component Value Date/Time   CHOL 201 (H) 11/07/2019 1045   TRIG 103.0 11/07/2019 1045   TRIG 92 05/28/2006 1104   HDL 85.80 11/07/2019 1045   LDLCALC 95 11/07/2019 1045   LDLDIRECT 113.3 07/18/2012 1113    Hepatic Function Latest Ref Rng & Units 11/07/2019 10/02/2018 06/19/2018  Total Protein 6.0 - 8.3 g/dL 6.8 7.0 6.7  Albumin 3.5 - 5.2 g/dL 4.2 4.1 4.1  AST 0 - 37 U/L  '14 16 16  ' ALT 0 - 35 U/L '10 12 12  ' Alk Phosphatase 39 - 117 U/L 92 95 119(H)  Total Bilirubin 0.2 - 1.2 mg/dL 0.8 1.0 0.5  Bilirubin, Direct 0.0 - 0.3 mg/dL 0.2 0.2 -     The ASCVD Risk score ([place]., et al., 2013) failed to calculate for the following reasons:   The 2013 ASCVD risk score is only valid for ages 58 to 66   The patient has a prior MI or stroke diagnosis   Patient has failed these meds in past: Atorvastatin (myalgias)  Patient is currently controlled on the following medications:  . Simvastatin 20 mg QHS   We discussed:  diet and exercise extensively. Eats chicken, ground beef, hot dogs, sandwiches.   Plan  Continue current medications  GERD    Patient has failed these meds in past: n/a Patient is currently controlled on the following medications:  . Pantoprazole 40 mg daily   We discussed:  Patient has esophageal stricture and would not likely be candidate for de-escalating PPI therapy. GERD symptoms well managed for long time on pantoprazole.  Plan  Continue current medications  Misc / OTC    . APAP 325 mg q6hr PRN    . Miralax 17g daily PRN  . FiberCon 1 tablet daily   Plan  Continue current medications  Medication  Management   [facility] Uses pill box? Yes  Plan  Continue current medication management strategy  Follow up: 12 month phone visit  [facility] [place]

## 2020-03-24 NOTE — Patient Instructions (Addendum)
Visit Information It was great speaking with you today!  Please let me know if you have any questions about our visit.  Goals Addressed            This Visit's Progress   . Chronic Care Management       CARE PLAN ENTRY (see longitudinal plan of care for additional care plan information)  Current Barriers:  . Chronic Disease Management support, education, and care coordination needs related to Hypertension, Hyperlipidemia, Atrial Fibrillation, GERD, Depression and Anxiety    Hypertension BP Readings from Last 3 Encounters:  11/26/19 (!) 146/68  11/07/19 140/64  07/16/19 130/70   . Pharmacist Clinical Goal(s): o Over the next 90 days, patient will work with PharmD and providers to maintain BP goal <140/90 . Current regimen:  . Amlodipine 5 mg daily  . Atenolol 50 mg 2 tablets daily  . Losartan 100 mg daily  . Interventions: o Discussed low salt diet and exercising as tolerated extensively . Patient self care activities - Over the next 90 days, patient will: o Check blood pressure 1-2 times monthly, document, and provide at future appointments o Ensure daily salt intake < 2300 mg/day  Hyperlipidemia Lab Results  Component Value Date/Time   LDLCALC 95 11/07/2019 10:45 AM   LDLDIRECT 113.3 07/18/2012 11:13 AM   . Pharmacist Clinical Goal(s): o Over the next 90 days, patient will work with PharmD and providers to maintain LDL goal < 100 . Current regimen:  o Simvastatin 20 mg nightly . Interventions: o Discussed low cholesterol diet and exercising as tolerated extensively  Medication management . Pharmacist Clinical Goal(s): o Over the next 90 days, patient will work with PharmD and providers to maintain optimal medication adherence . [facility] . Interventions o Comprehensive medication review performed. o Continue current medication management strategy . Patient self care activities - Over the next 90 days, patient will: o Take medications as  prescribed o Report any questions or concerns to PharmD and/or provider(s)       Christine Middleton was given information about Chronic Care Management services today including:  1. CCM service includes personalized support from designated clinical staff supervised by her physician, including individualized plan of care and coordination with other care providers 2. 24/7 contact phone numbers for assistance for urgent and routine care needs. 3. Standard insurance, coinsurance, copays and deductibles apply for chronic care management only during months in which we provide at least 20 minutes of these services. Most insurances cover these services at 100%, however patients may be responsible for any copay, coinsurance and/or deductible if applicable. This service may help you avoid the need for more expensive face-to-face services. 4. Only one practitioner may furnish and bill the service in a calendar month. 5. The patient may stop CCM services at any time (effective at the end of the month) by phone call to the office staff.  Patient agreed to services and verbal consent obtained.   The patient verbalized understanding of instructions provided today and agreed to receive a mailed copy of patient instruction and/or educational materials. Face to Face appointment with pharmacist scheduled for:  04/06/21 at 11:00 AM  [facility] [place]   Dyslipidemia Dyslipidemia is an imbalance of waxy, fat-like substances (lipids) in the blood. The body needs lipids in small amounts. Dyslipidemia often involves a high level of cholesterol or triglycerides, which are types of lipids. Common forms of dyslipidemia include:  High levels of LDL cholesterol. LDL  is the type of cholesterol that causes fatty deposits (plaques) to build up in the blood vessels that carry blood away from your heart (arteries).  Low levels of HDL cholesterol. HDL cholesterol  is the type of cholesterol that protects against heart disease. High levels of HDL remove the LDL buildup from arteries.  High levels of triglycerides. Triglycerides are a fatty substance in the blood that is linked to a buildup of plaques in the arteries. What are the causes? Primary dyslipidemia is caused by changes (mutations) in genes that are passed down through families (inherited). These mutations cause several types of dyslipidemia. Secondary dyslipidemia is caused by lifestyle choices and diseases that lead to dyslipidemia, such as:  Eating a diet that is high in animal fat.  Not getting enough exercise.  Having diabetes, kidney disease, liver disease, or thyroid disease.  Drinking large amounts of alcohol.  Using certain medicines. What increases the risk? You are more likely to develop this condition if you are an older man or if you are a woman who has gone through menopause. Other risk factors include:  Having a family history of dyslipidemia.  Taking certain medicines, including birth control pills, steroids, some diuretics, and beta-blockers.  Smoking cigarettes.  Eating a high-fat diet.  Having certain medical conditions such as diabetes, polycystic ovary syndrome (PCOS), kidney disease, liver disease, or hypothyroidism.  Not exercising regularly.  Being overweight or obese with too much belly fat. What are the signs or symptoms? In most cases, dyslipidemia does not usually cause any symptoms. In severe cases, very high lipid levels can cause:  Fatty bumps under the skin (xanthomas).  White or gray ring around the black center (pupil) of the eye. Very high triglyceride levels can cause inflammation of the pancreas (pancreatitis). How is this diagnosed? Your health care provider may diagnose dyslipidemia based on a routine blood test (fasting blood test). Because most people do not have symptoms of the condition, this blood testing (lipid profile) is done on  adults age 58 and older and is repeated every 5 years. This test checks:  Total cholesterol. This measures the total amount of cholesterol in your blood, including LDL cholesterol, HDL cholesterol, and triglycerides. A healthy number is below 200.  LDL cholesterol. The target number for LDL cholesterol is different for each person, depending on individual risk factors. Ask your health care provider what your LDL cholesterol should be.  HDL cholesterol. An HDL level of 60 or higher is best because it helps to protect against heart disease. A number below 76 for men or below 69 for women increases the risk for heart disease.  Triglycerides. A healthy triglyceride number is below 150. If your lipid profile is abnormal, your health care provider may do other blood tests. How is this treated? Treatment depends on the type of dyslipidemia that you have and your other risk factors for heart disease and stroke. Your health care provider will have a target range for your lipid levels based on this information. For many people, this condition may be treated by lifestyle changes, such as diet and exercise. Your health care provider may recommend that you:  Get regular exercise.  Make changes to your diet.  Quit smoking if you smoke. If diet changes and exercise do not help you reach your goals, your health care provider may also prescribe medicine to lower lipids. The most commonly prescribed type of medicine lowers your LDL cholesterol (statin drug). If you have a high triglyceride level, your provider  may prescribe another type of drug (fibrate) or an omega-3 fish oil supplement, or both. Follow these instructions at home:  Eating and drinking  Follow instructions from your health care provider or dietitian about eating or drinking restrictions.  Eat a healthy diet as told by your health care provider. This can help you reach and maintain a healthy weight, lower your LDL cholesterol, and raise your  HDL cholesterol. This may include: ? Limiting your calories, if you are overweight. ? Eating more fruits, vegetables, whole grains, fish, and lean meats. ? Limiting saturated fat, trans fat, and cholesterol.  If you drink alcohol: ? Limit how much you use. ? Be aware of how much alcohol is in your drink. In the [place] one drink equals one 12 oz bottle of beer (355 mL), one 5 oz glass of wine (148 mL), or one 1 oz glass of hard liquor (44 mL).  Do not drink alcohol if: ? Your health care provider tells you not to drink. ? You are pregnant, may be pregnant, or are planning to become pregnant. Activity  Get regular exercise. Start an exercise and strength training program as told by your health care provider. Ask your health care provider what activities are safe for you. Your health care provider may recommend: ? 30 minutes of aerobic activity 4-6 days a week. Brisk walking is an example of aerobic activity. ? Strength training 2 days a week. General instructions  Do not use any products that contain nicotine or tobacco, such as cigarettes, e-cigarettes, and chewing tobacco. If you need help quitting, ask your health care provider.  Take over-the-counter and prescription medicines only as told by your health care provider. This includes supplements.  Keep all follow-up visits as told by your health care provider. Contact a health care provider if:  You are: ? Having trouble sticking to your exercise or diet plan. ? Struggling to quit smoking or control your use of alcohol. Summary  Dyslipidemia often involves a high level of cholesterol or triglycerides, which are types of lipids.  Treatment depends on the type of dyslipidemia that you have and your other risk factors for heart disease and stroke.  For many people, treatment starts with lifestyle changes, such as diet and exercise.  Your health care provider may prescribe medicine to lower lipids. This information is not intended  to replace advice given to you by your health care provider. Make sure you discuss any questions you have with your health care provider. Document Revised: 03/11/2018 Document Reviewed: 02/15/2018 Elsevier Patient Education  [employer].

## 2020-04-30 ENCOUNTER — Telehealth: Payer: Self-pay

## 2020-04-30 NOTE — Progress Notes (Signed)
Chronic Care Management Pharmacy Assistant   Name: ILENA DIECKMAN  MRN: [id number] DOB: 13-Aug-1922  Reason for George Call.   PCP : Laurey Morale, MD  Allergies:   Allergies  Allergen Reactions  . Amoxicillin     REACTION: rash  . Codeine   . Lipitor [Atorvastatin]     Muscle pain, stiff joints  . Lisinopril   . Nitrofurantoin     Medications: Outpatient Encounter Medications as of 04/30/2020  Medication Sig  . acetaminophen (TYLENOL) 325 MG tablet Take 325 mg by mouth every 6 (six) hours as needed for mild pain or headache.  Marland Kitchen amLODipine (NORVASC) 5 MG tablet TAKE 1 TABLET BY MOUTH EVERY DAY  . atenolol (TENORMIN) 50 MG tablet TAKE 2 TABLETS BY MOUTH EVERY DAY  . docusate sodium (COLACE) 100 MG capsule Take 1 capsule (100 mg total) by mouth 2 (two) times daily.  Marland Kitchen ELIQUIS 2.5 MG TABS tablet TAKE 1 TABLET BY MOUTH TWICE A DAY  . losartan (COZAAR) 100 MG tablet TAKE 1 TABLET BY MOUTH EVERY DAY  . pantoprazole (PROTONIX) 40 MG tablet Take 1 tablet (40 mg total) by mouth daily.  . polycarbophil (FIBERCON) 625 MG tablet Take 625 mg by mouth daily.  . polyethylene glycol (MIRALAX / GLYCOLAX) 17 g packet Take 17 g by mouth daily.  . polyvinyl alcohol (LIQUIFILM TEARS) 1.4 % ophthalmic solution Place 1 drop into both eyes as needed for dry eyes.  . simvastatin (ZOCOR) 20 MG tablet TAKE 1 TABLET BY MOUTH EVERYDAY AT BEDTIME   No facility-administered encounter medications on file as of 04/30/2020.    Current Diagnosis: Patient Active Problem List   Diagnosis Date Noted  . Rectal bleeding 07/08/2019  . Depression with anxiety 09/26/2017  . Hypertension 01/17/2017  . Pedal edema 10/18/2016  . TMJ (dislocation of temporomandibular joint) 09/18/2016  . 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  . Cerebrovascular accident (CVA) ([place])   . Embolic stroke ([place]) 54/00/8676  . Facial droop 08/18/2016  . Cerebral infarction ([place]) 10/04/2013  . HYPERGLYCEMIA  06/30/2010  . Cerebral artery occlusion with cerebral infarction ([place]) 07/19/2009  . Disorder resulting from impaired renal function 07/19/2009  . Polymyalgia rheumatica ([place]) 07/19/2009  . VITAMIN B12 DEFICIENCY 06/30/2009  . Anxiety state 06/28/2009  . Hereditary and idiopathic peripheral neuropathy 06/28/2009  . External hemorrhoids 12/03/2008  . ESOPHAGEAL STRICTURE 11/30/2008  . DIVERTICULOSIS, COLON 11/30/2008  . GROSS HEMATURIA 11/04/2008  . MYALGIA 11/04/2008  . Constipation 06/24/2008  . ACUTE CYSTITIS 03/12/2008  . HLD (hyperlipidemia) 05/30/2007  . SKIN LESION 05/30/2007  . Essential hypertension 03/19/2007  . GERD 03/19/2007   Follow-Up:  Pharmacist Review   Reviewed chart prior to disease state call. Spoke with patient regarding BP  Recent Office Vitals: BP Readings from Last 3 Encounters:  11/26/19 (!) 146/68  11/07/19 140/64  07/16/19 130/70   Pulse Readings from Last 3 Encounters:  11/26/19 74  11/07/19 75  07/16/19 75    Wt Readings from Last 3 Encounters:  11/26/19 139 lb 3.2 oz (63.1 kg)  11/07/19 139 lb 12.8 oz (63.4 kg)  07/16/19 142 lb 9.6 oz (64.7 kg)     Kidney Function Lab Results  Component Value Date/Time   CREATININE 0.99 11/07/2019 10:45 AM   CREATININE 0.80 07/11/2019 06:36 AM   GFR 52.09 (L) 11/07/2019 10:45 AM   GFRNONAA >60 07/11/2019 06:36 AM   GFRAA >60 07/11/2019 06:36 AM    BMP Latest Ref Rng & Units 11/07/2019  07/11/2019 07/10/2019  Glucose 70 - 99 mg/dL 113(H) 101(H) 116(H)  BUN 6 - 23 mg/dL 18 12 9   Creatinine 0.40 - 1.20 mg/dL 0.99 0.80 0.88  Sodium 135 - 145 mEq/L 141 139 145  Potassium 3.5 - 5.1 mEq/L 4.3 3.5 3.8  Chloride 96 - 112 mEq/L 103 108 116(H)  CO2 19 - 32 mEq/L 28 22 20(L)  Calcium 8.4 - 10.5 mg/dL 9.4 8.5(L) 8.7(L)    . Current antihypertensive regimen:   Amlodipine 5 mg daily   Atenolol 50 mg 2 tablets daily   Losartan 100 mg daily  . How often are you checking your Blood Pressure?  weekly . Current home BP readings:  o Patient states her blood pressure was low last week. It was 113/65, (patient states she check her blood pressure  multiple times in that day)103/68, at 11:00 am it was 65/67 and at 12:00 PM it was 109/71. o Patient states she felt tried, lightheaded and dizzy, "all I did was lay down".Patient states she checks her blood pressure on her left arm ,sitting down using a arm cuff without talking. o Patient states her blood pressure on 05/17/2020 was 136/78. . What recent interventions/DTPs have been made by any provider to improve Blood Pressure control since last CPP Visit: None ID . Any recent hospitalizations or ED visits since last visit with CPP? No . What diet changes have been made to improve Blood Pressure Control?  o Patient reports she does not have a good appetite,"I only eat because I know I need to". . What exercise is being done to improve your Blood Pressure Control?  o Patient states she walks 200 FT to her mailbox everyday,and does house cleaning.  Adherence Review: Is the patient currently on ACE/ARB medication? Yes Does the patient have >5 day gap between last estimated fill dates? Yes   [place] Pharmacist Assistant [phone]

## 2020-06-11 ENCOUNTER — Other Ambulatory Visit: Payer: Self-pay | Admitting: Family Medicine

## 2020-06-11 NOTE — Telephone Encounter (Signed)
Patient needs an appointment

## 2020-07-07 ENCOUNTER — Other Ambulatory Visit: Payer: Self-pay | Admitting: Family Medicine

## 2020-08-03 ENCOUNTER — Telehealth: Payer: Self-pay | Admitting: Family Medicine

## 2020-08-03 NOTE — Telephone Encounter (Signed)
Spoke with pt  She will have her daughter call back to  schedule Medicare Annual Wellness Visit (AWV) either virtually or in office.   Last AWV no information  please schedule at anytime with [facility] Nurse Health Advisor 1 or 2   This should be a 45 minute visit.

## 2020-08-04 NOTE — Telephone Encounter (Signed)
Pt and daughter will call back to schedule

## 2020-08-17 ENCOUNTER — Other Ambulatory Visit: Payer: Self-pay | Admitting: Family Medicine

## 2020-08-26 ENCOUNTER — Encounter: Payer: Self-pay | Admitting: Family Medicine

## 2020-08-26 ENCOUNTER — Ambulatory Visit (INDEPENDENT_AMBULATORY_CARE_PROVIDER_SITE_OTHER): Payer: Medicare Other | Admitting: Family Medicine

## 2020-08-26 ENCOUNTER — Other Ambulatory Visit: Payer: Self-pay

## 2020-08-26 VITALS — BP 144/74 | HR 69 | Ht 62.0 in | Wt 141.0 lb

## 2020-08-26 DIAGNOSIS — K644 Residual hemorrhoidal skin tags: Secondary | ICD-10-CM

## 2020-08-26 MED ORDER — HYDROCORTISONE ACETATE 25 MG RE SUPP
25.0000 mg | Freq: Two times a day (BID) | RECTAL | 11 refills | Status: DC
Start: 2020-08-26 — End: 2021-05-04

## 2020-08-26 NOTE — Progress Notes (Signed)
   Subjective:    Patient ID: Samuel Jester Reames, female    DOB: 21-Jun-1923, 87 y.o.   MRN: [id number]  HPI Here with her daughter for bleeding hemorrhoids. We have discussed this several times over the past few years. We have advised her to use Miralax daily to keep the stools soft, but she admits to using it only once or twice a week. She has light bright red bleeding with stools several days a week. There is no pain.    Review of Systems  Constitutional: Negative.   Respiratory: Negative.   Cardiovascular: Negative.   Gastrointestinal: Positive for anal bleeding. Negative for abdominal pain and rectal pain.       Objective:   Physical Exam Constitutional:      Appearance: Normal appearance. She is not ill-appearing.  Cardiovascular:     Rate and Rhythm: Normal rate and regular rhythm.     Pulses: Normal pulses.     Heart sounds: Normal heart sounds.  Pulmonary:     Effort: Pulmonary effort is normal.     Breath sounds: Normal breath sounds.  Neurological:     Mental Status: She is alert.           Assessment & Plan:  External hemorrhoids. I refilled the Anusol suppositories that she can use as needed. I stressed to her the importance of taking Miralax every day no matter what. Recheck as needed.  [name], MD

## 2020-09-04 ENCOUNTER — Other Ambulatory Visit: Payer: Self-pay

## 2020-09-04 ENCOUNTER — Emergency Department (HOSPITAL_COMMUNITY): Payer: Medicare Other

## 2020-09-04 ENCOUNTER — Encounter (HOSPITAL_COMMUNITY): Payer: Self-pay | Admitting: Emergency Medicine

## 2020-09-04 ENCOUNTER — Observation Stay (HOSPITAL_COMMUNITY)
Admission: EM | Admit: 2020-09-04 | Discharge: 2020-09-06 | Disposition: A | Discharge status: Home / Self Care | Payer: Medicare Other | Attending: Internal Medicine | Admitting: Internal Medicine

## 2020-09-04 DIAGNOSIS — R109 Unspecified abdominal pain: Secondary | ICD-10-CM | POA: Insufficient documentation

## 2020-09-04 DIAGNOSIS — Z7901 Long term (current) use of anticoagulants: Secondary | ICD-10-CM | POA: Insufficient documentation

## 2020-09-04 DIAGNOSIS — D62 Acute posthemorrhagic anemia: Secondary | ICD-10-CM | POA: Diagnosis not present

## 2020-09-04 DIAGNOSIS — Z20822 Contact with and (suspected) exposure to covid-19: Secondary | ICD-10-CM | POA: Diagnosis not present

## 2020-09-04 DIAGNOSIS — S34101A Unspecified injury to L1 level of lumbar spinal cord, initial encounter: Secondary | ICD-10-CM | POA: Diagnosis present

## 2020-09-04 DIAGNOSIS — S32010A Wedge compression fracture of first lumbar vertebra, initial encounter for closed fracture: Secondary | ICD-10-CM | POA: Diagnosis not present

## 2020-09-04 DIAGNOSIS — I10 Essential (primary) hypertension: Secondary | ICD-10-CM | POA: Diagnosis present

## 2020-09-04 DIAGNOSIS — S32019A Unspecified fracture of first lumbar vertebra, initial encounter for closed fracture: Secondary | ICD-10-CM | POA: Diagnosis not present

## 2020-09-04 DIAGNOSIS — R1011 Right upper quadrant pain: Secondary | ICD-10-CM

## 2020-09-04 DIAGNOSIS — K5730 Diverticulosis of large intestine without perforation or abscess without bleeding: Secondary | ICD-10-CM | POA: Diagnosis present

## 2020-09-04 DIAGNOSIS — R52 Pain, unspecified: Secondary | ICD-10-CM | POA: Diagnosis not present

## 2020-09-04 DIAGNOSIS — K644 Residual hemorrhoidal skin tags: Secondary | ICD-10-CM | POA: Diagnosis present

## 2020-09-04 DIAGNOSIS — D649 Anemia, unspecified: Secondary | ICD-10-CM

## 2020-09-04 DIAGNOSIS — K625 Hemorrhage of anus and rectum: Secondary | ICD-10-CM | POA: Diagnosis not present

## 2020-09-04 DIAGNOSIS — Z79899 Other long term (current) drug therapy: Secondary | ICD-10-CM | POA: Insufficient documentation

## 2020-09-04 DIAGNOSIS — I639 Cerebral infarction, unspecified: Secondary | ICD-10-CM | POA: Diagnosis present

## 2020-09-04 DIAGNOSIS — S3991XA Unspecified injury of abdomen, initial encounter: Secondary | ICD-10-CM | POA: Diagnosis not present

## 2020-09-04 DIAGNOSIS — I959 Hypotension, unspecified: Secondary | ICD-10-CM | POA: Diagnosis not present

## 2020-09-04 DIAGNOSIS — K449 Diaphragmatic hernia without obstruction or gangrene: Secondary | ICD-10-CM | POA: Diagnosis not present

## 2020-09-04 DIAGNOSIS — K922 Gastrointestinal hemorrhage, unspecified: Secondary | ICD-10-CM

## 2020-09-04 DIAGNOSIS — W1839XA Other fall on same level, initial encounter: Secondary | ICD-10-CM | POA: Insufficient documentation

## 2020-09-04 DIAGNOSIS — M25552 Pain in left hip: Secondary | ICD-10-CM | POA: Diagnosis not present

## 2020-09-04 DIAGNOSIS — Y92009 Unspecified place in unspecified non-institutional (private) residence as the place of occurrence of the external cause: Secondary | ICD-10-CM | POA: Diagnosis not present

## 2020-09-04 DIAGNOSIS — W19XXXA Unspecified fall, initial encounter: Secondary | ICD-10-CM

## 2020-09-04 DIAGNOSIS — E785 Hyperlipidemia, unspecified: Secondary | ICD-10-CM | POA: Diagnosis present

## 2020-09-04 DIAGNOSIS — M545 Low back pain, unspecified: Secondary | ICD-10-CM | POA: Diagnosis not present

## 2020-09-04 LAB — COMPREHENSIVE METABOLIC PANEL
ALT: 21 U/L (ref 0–44)
AST: 23 U/L (ref 15–41)
Albumin: 3.2 g/dL — ABNORMAL LOW (ref 3.5–5.0)
Alkaline Phosphatase: 79 U/L (ref 38–126)
Anion gap: 12 (ref 5–15)
BUN: 22 mg/dL (ref 8–23)
CO2: 22 mmol/L (ref 22–32)
Calcium: 8.5 mg/dL — ABNORMAL LOW (ref 8.9–10.3)
Chloride: 104 mmol/L (ref 98–111)
Creatinine, Ser: 1.24 mg/dL — ABNORMAL HIGH (ref 0.44–1.00)
GFR, Estimated: 40 mL/min — ABNORMAL LOW (ref >60)
Glucose, Bld: 148 mg/dL — ABNORMAL HIGH (ref 70–99)
Potassium: 4 mmol/L (ref 3.5–5.1)
Sodium: 138 mmol/L (ref 135–145)
Total Bilirubin: 1.2 mg/dL (ref 0.3–1.2)
Total Protein: 6.1 g/dL — ABNORMAL LOW (ref 6.5–8.1)

## 2020-09-04 LAB — I-STAT CHEM 8, ED
BUN: 23 mg/dL (ref 8–23)
Calcium, Ion: 1.1 mmol/L — ABNORMAL LOW (ref 1.15–1.40)
Chloride: 106 mmol/L (ref 98–111)
Creatinine, Ser: 1.2 mg/dL — ABNORMAL HIGH (ref 0.44–1.00)
Glucose, Bld: 144 mg/dL — ABNORMAL HIGH (ref 70–99)
HCT: 26 % — ABNORMAL LOW (ref 36.0–46.0)
Hemoglobin: 8.8 g/dL — ABNORMAL LOW (ref 12.0–15.0)
Potassium: 3.9 mmol/L (ref 3.5–5.1)
Sodium: 138 mmol/L (ref 135–145)
TCO2: 24 mmol/L (ref 22–32)

## 2020-09-04 LAB — CBC WITH DIFFERENTIAL/PLATELET
Abs Immature Granulocytes: 0.12 10*3/uL — ABNORMAL HIGH (ref 0.00–0.07)
Basophils Absolute: 0.1 10*3/uL (ref 0.0–0.1)
Basophils Relative: 1 %
Eosinophils Absolute: 0 10*3/uL (ref 0.0–0.5)
Eosinophils Relative: 0 %
HCT: 29.3 % — ABNORMAL LOW (ref 36.0–46.0)
Hemoglobin: 9 g/dL — ABNORMAL LOW (ref 12.0–15.0)
Immature Granulocytes: 1 %
Lymphocytes Relative: 6 %
Lymphs Abs: 0.8 10*3/uL (ref 0.7–4.0)
MCH: 28.4 pg (ref 26.0–34.0)
MCHC: 30.7 g/dL (ref 30.0–36.0)
MCV: 92.4 fL (ref 80.0–100.0)
Monocytes Absolute: 1.1 10*3/uL — ABNORMAL HIGH (ref 0.1–1.0)
Monocytes Relative: 8 %
Neutro Abs: 12 10*3/uL — ABNORMAL HIGH (ref 1.7–7.7)
Neutrophils Relative %: 84 %
Platelets: 236 10*3/uL (ref 150–400)
RBC: 3.17 MIL/uL — ABNORMAL LOW (ref 3.87–5.11)
RDW: 13 % (ref 11.5–15.5)
WBC: 14.2 10*3/uL — ABNORMAL HIGH (ref 4.0–10.5)
nRBC: 0 % (ref 0.0–0.2)

## 2020-09-04 LAB — HEMOGLOBIN AND HEMATOCRIT, BLOOD
HCT: 28.5 % — ABNORMAL LOW (ref 36.0–46.0)
Hemoglobin: 9.3 g/dL — ABNORMAL LOW (ref 12.0–15.0)

## 2020-09-04 MED ORDER — ACETAMINOPHEN 325 MG PO TABS
650.0000 mg | ORAL_TABLET | Freq: Once | ORAL | Status: AC
  Administered 2020-09-04: 650 mg via ORAL
  Filled 2020-09-04: qty 2

## 2020-09-04 MED ORDER — IOHEXOL 300 MG/ML  SOLN
100.0000 mL | Freq: Once | INTRAMUSCULAR | Status: AC | PRN
  Administered 2020-09-04: 100 mL via INTRAVENOUS

## 2020-09-04 MED ORDER — ACETAMINOPHEN 650 MG RE SUPP: 650.0000 mg | Freq: Four times a day (QID) | RECTAL | Status: DC | PRN

## 2020-09-04 MED ORDER — CALCIUM POLYCARBOPHIL 625 MG PO TABS
625.0000 mg | ORAL_TABLET | Freq: Every day | ORAL | Status: DC
  Administered 2020-09-05 – 2020-09-06 (×2): 625 mg via ORAL
  Filled 2020-09-04 (×2): qty 1

## 2020-09-04 MED ORDER — FENTANYL CITRATE (PF) 100 MCG/2ML IJ SOLN
50.0000 ug | Freq: Once | INTRAMUSCULAR | Status: AC
  Administered 2020-09-04: 50 ug via INTRAVENOUS
  Filled 2020-09-04: qty 2

## 2020-09-04 MED ORDER — LOSARTAN POTASSIUM 50 MG PO TABS
100.0000 mg | ORAL_TABLET | Freq: Every evening | ORAL | Status: DC
  Administered 2020-09-04 – 2020-09-05 (×2): 100 mg via ORAL
  Filled 2020-09-04 (×2): qty 2

## 2020-09-04 MED ORDER — ATENOLOL 25 MG PO TABS
100.0000 mg | ORAL_TABLET | Freq: Every day | ORAL | Status: DC
Start: 2020-09-05 — End: 2020-09-06
  Administered 2020-09-05 – 2020-09-06 (×2): 100 mg via ORAL
  Filled 2020-09-04: qty 2
  Filled 2020-09-04: qty 4

## 2020-09-04 MED ORDER — SIMVASTATIN 20 MG PO TABS
20.0000 mg | ORAL_TABLET | Freq: Every evening | ORAL | Status: DC
  Administered 2020-09-04 – 2020-09-05 (×2): 20 mg via ORAL
  Filled 2020-09-04 (×2): qty 1

## 2020-09-04 MED ORDER — ACETAMINOPHEN 325 MG PO TABS: 650.0000 mg | ORAL_TABLET | Freq: Four times a day (QID) | ORAL | Status: DC | PRN

## 2020-09-04 MED ORDER — FENTANYL CITRATE (PF) 100 MCG/2ML IJ SOLN
25.0000 ug | Freq: Once | INTRAMUSCULAR | Status: AC
  Administered 2020-09-04: 25 ug via INTRAVENOUS
  Filled 2020-09-04: qty 2

## 2020-09-04 MED ORDER — ACETAMINOPHEN 325 MG PO TABS: 325.0000 mg | ORAL_TABLET | Freq: Four times a day (QID) | ORAL | Status: DC | PRN

## 2020-09-04 MED ORDER — ONDANSETRON HCL 4 MG/2ML IJ SOLN
4.0000 mg | Freq: Once | INTRAMUSCULAR | Status: AC
  Administered 2020-09-04: 4 mg via INTRAVENOUS
  Filled 2020-09-04: qty 2

## 2020-09-04 MED ORDER — AMLODIPINE BESYLATE 5 MG PO TABS
5.0000 mg | ORAL_TABLET | Freq: Every day | ORAL | Status: DC
  Administered 2020-09-05 – 2020-09-06 (×2): 5 mg via ORAL
  Filled 2020-09-04 (×2): qty 1

## 2020-09-04 MED ORDER — FENTANYL CITRATE (PF) 100 MCG/2ML IJ SOLN
25.0000 ug | INTRAMUSCULAR | Status: DC | PRN
Start: 2020-09-04 — End: 2020-09-06

## 2020-09-04 MED ORDER — HYDROCORTISONE ACETATE 25 MG RE SUPP
25.0000 mg | Freq: Two times a day (BID) | RECTAL | Status: DC
  Administered 2020-09-04 – 2020-09-06 (×4): 25 mg via RECTAL
  Filled 2020-09-04 (×5): qty 1

## 2020-09-04 NOTE — ED Provider Notes (Signed)
Patient care assumed at 1500. Patient here for evaluation following a fall at home, takes eloquence. She has severe back pain, abdominal pain, plan to obtain CT abdomen pelvis to further evaluate.  CT scan demonstrates L1 compression fracture. Hemoglobin is decreased to nine compared to most recent on April 21. She does report intermittent hematochezia at home, last episode with a few days ago. She does have a history of prior workup for same and was diagnosed with external hemorrhoids. On examination she does have external hemorrhoids presents with gross blood present on external as well as digital rectal exam. She does have significant pain in the emergency department despite pain medications, difficulty with repositioning. Given her ongoing pain, worsening anemia and active bleeding on anticoagulation medicine consulted for admission for further evaluation and treatment. Patient and daughter are in agreement with treatment plan.  D/w Dr. Reatha Armour with Neurosurgery regarding patient's L1 fracture, he will see the patient in consult.   Christine Reichert, MD 09/05/20 0030

## 2020-09-04 NOTE — H&P (Signed)
History and Physical    Christine Middleton [id number] DOB: 08/02/1922 DOA: 09/04/2020  PCP: Laurey Morale, MD Patient coming from: Home  Chief Complaint: Fall and back pain   HPI: Christine Middleton is a 87 y.o. female with medical history significant of previous CVA, HTN, HLD, GERD who presented with  Fall and back pain.  Patient lives at home with her son.  She reports that she was a putting something back in the refrigerator when she fell straight down onto her buttocks.  She felt pain to her lower back.  She does not know exactly what made her fall but she feels like she just lost her balance.  Denies hitting her head.  Denies loss of consciousness, headaches, blurry vision, seizure activities or urinary/bowel incontinence.  Patient called her son and daughter who assisted her up and brought her to the ED for further evaluation.  Denies fevers, chills, shortness of breath, cough, wheezing, chest pain, chest pressure, nausea, vomiting, diarrhea, abdominal pain, dysuria, urinary frequency or urgency.  In the emergency room, she was afebrile with pulse 77, RR 19, BP 123/49 and room air O2 sats 95%.  Labs showed creatinine 1.2, WBC 14.2, hemoglobin 9.0, UA is pending.  EKG showed sinus rhythm.  CT showed Acute minimal compression fracture of the L1 vertebral body without significant height loss. Chronic L2 and L3 vertebral body compression fractures.  Of note, patient reports intermittent BRBPR for several years, and it was thought to be due to hemorrhoid bleed.  She had a colonoscopy on 07/10/2019.  Patient reports worsening BRBPR over last week.  She was evaluated by her PCP on 08/26/2020.   Review of Systems: As per HPI otherwise 10 point review of systems negative.  Review of Systems Otherwise negative except as per HPI, including: General: Denies fever, chills, night sweats or unintended weight loss. Resp: Denies cough, wheezing, shortness of breath. Cardiac: Denies chest pain, palpitations,  orthopnea, paroxysmal nocturnal dyspnea. GI: Denies abdominal pain, nausea, vomiting, diarrhea or constipation.  Positive for chronic intermittent BRBPR.  GU: Denies dysuria, frequency, hesitancy or incontinence MS: Denies muscle aches, joint pain or swelling.  Fall and lower back pain Neuro: Denies headache, neurologic deficits (focal weakness, numbness, tingling), abnormal gait Psych: Denies anxiety, depression, SI/HI/AVH Skin: Denies new rashes or lesions ID: Denies sick contacts, exotic exposures, travel  Past Medical History:  Diagnosis Date  . Diverticulitis   . Esophageal stricture   . GERD (gastroesophageal reflux disease)   . Hyperlipidemia   . Hypertension   . Shingles   . Stroke ([place])   . Vitamin B 12 deficiency     Past Surgical History:  Procedure Laterality Date  . ABDOMINAL HYSTERECTOMY    . APPENDECTOMY    . COLONOSCOPY  12-09-08   per Dr. Sharlett Iles (incomplete along with barium enema 12-10-08) with hemorrhoids  and diverticulae  . COLONOSCOPY WITH PROPOFOL N/A 07/10/2019   Procedure: COLONOSCOPY WITH PROPOFOL;  Surgeon: Rush Landmark Telford Nab., MD;  Location: [facility];  Service: Gastroenterology;  Laterality: N/A;  . ESOPHAGOGASTRODUODENOSCOPY  09-2004   per Dr. Sharlett Iles with dilatation   . EYE SURGERY    . HEMOSTASIS CLIP PLACEMENT  07/10/2019   Procedure: HEMOSTASIS CLIP PLACEMENT;  Surgeon: Irving Copas., MD;  Location: [facility];  Service: Gastroenterology;;  . POLYPECTOMY  07/10/2019   Procedure: POLYPECTOMY;  Surgeon: Irving Copas., MD;  Location: [facility];  Service: Gastroenterology;;  . TONSILLECTOMY      SOCIAL HISTORY:  reports that  she has never smoked. She has never used smokeless tobacco. She reports that she does not drink alcohol and does not use drugs.  Allergies  Allergen Reactions  . Amoxicillin     REACTION: rash  . Codeine   . Lipitor [Atorvastatin]     Muscle pain, stiff joints  . Lisinopril   .  Nitrofurantoin     FAMILY HISTORY: Family History  Problem Relation Age of Onset  . Heart attack Mother   . Heart disease Father   . Breast cancer Other   . Diabetes Other   . Hypertension Other   . Stroke Other   . Heart disease Other   . Stroke Sister   . Cancer Brother   . Cancer Brother   . Diabetes Brother      Prior to Admission medications   Medication Sig Start Date End Date Taking? Authorizing Provider  acetaminophen (TYLENOL) 325 MG tablet Take 325 mg by mouth every 6 (six) hours as needed for mild pain or headache.   Yes [provider]  amLODipine (NORVASC) 5 MG tablet TAKE 1 TABLET BY MOUTH EVERY DAY 10/23/19  Yes Laurey Morale, MD  atenolol (TENORMIN) 50 MG tablet TAKE 2 TABLETS BY MOUTH EVERY DAY 10/23/19  Yes Laurey Morale, MD  ELIQUIS 2.5 MG TABS tablet TAKE 1 TABLET BY MOUTH TWICE A DAY 09/25/19  Yes [place], Peter M, MD  hydrocortisone (ANUSOL-HC) 25 MG suppository Place 1 suppository (25 mg total) rectally 2 (two) times daily. 08/26/20  Yes Laurey Morale, MD  losartan (COZAAR) 100 MG tablet TAKE 1 TABLET BY MOUTH EVERY DAY Patient taking differently: Take 100 mg by mouth every evening. 07/07/20  Yes Laurey Morale, MD  pantoprazole (PROTONIX) 40 MG tablet Take 1 tablet (40 mg total) by mouth daily. 11/07/19  Yes Laurey Morale, MD  polycarbophil (FIBERCON) 625 MG tablet Take 625 mg by mouth daily.   Yes [provider]  polyethylene glycol (MIRALAX / GLYCOLAX) 17 g packet Take 17 g by mouth daily. 07/12/19  Yes Harold Hedge, MD  simvastatin (ZOCOR) 20 MG tablet TAKE 1 TABLET BY MOUTH EVERYDAY AT BEDTIME Patient taking differently: Take 20 mg by mouth every evening. TAKE 1 TABLET BY MOUTH EVERYDAY AT BEDTIME 08/17/20  Yes Laurey Morale, MD    Physical Exam: Vitals:   09/04/20 1830 09/04/20 1845 09/04/20 1930 09/04/20 1945  BP: (!) 144/68 (!) 120/53 131/72 138/76  Pulse: 75 76 78 76  Resp: 19 17 12 17   Temp:      TempSrc:      SpO2: 91% 95%  92% 95%      Constitutional: NAD, calm, comfortable Eyes: PERRL, lids and conjunctivae normal ENMT: Mucous membranes are moist. Posterior pharynx clear of any exudate or lesions.Normal dentition.  Neck: normal, supple, no masses, no thyromegaly Respiratory: clear to auscultation bilaterally, no wheezing, no crackles. Normal respiratory effort. No accessory muscle use.  Cardiovascular: Regular rate and rhythm, no murmurs / rubs / gallops. No extremity edema. 2+ pedal pulses. No carotid bruits.  Abdomen: no tenderness, no masses palpated. No hepatosplenomegaly. Bowel sounds positive.  Musculoskeletal: no clubbing / cyanosis. No joint deformity upper and lower extremities. Good ROM, no contractures. Normal muscle tone.  Skin: no rashes, lesions, ulcers. No induration Neurologic: CN 2-12 grossly intact. Sensation intact, DTR normal. Strength 5/5 in all 4.  Psychiatric: Normal judgment and insight. Alert and oriented x 3. Normal mood.     Labs on Admission:  I have personally reviewed following labs and imaging studies  CBC: Recent Labs  Lab 09/04/20 1421 09/04/20 1429 09/04/20 2114  WBC 14.2*  --   --   NEUTROABS 12.0*  --   --   HGB 9.0* 8.8* 9.3*  HCT 29.3* 26.0* 28.5*  MCV 92.4  --   --   PLT 236  --   --    Basic Metabolic Panel: Recent Labs  Lab 09/04/20 1421 09/04/20 1429  Sameen Leas 138 138  K 4.0 3.9  CL 104 106  CO2 22  --   GLUCOSE 148* 144*  BUN 22 23  CREATININE 1.24* 1.20*  CALCIUM 8.5*  --    GFR: Estimated Creatinine Clearance: 24.1 mL/min (A) (by C-G formula based on SCr of 1.2 mg/dL (H)). Liver Function Tests: Recent Labs  Lab 09/04/20 1421  AST 23  ALT 21  ALKPHOS 79  BILITOT 1.2  PROT 6.1*  ALBUMIN 3.2*   No results for input(s): LIPASE, AMYLASE in the last 168 hours. No results for input(s): AMMONIA in the last 168 hours. Coagulation Profile: No results for input(s): INR, PROTIME in the last 168 hours. Cardiac Enzymes: No results for input(s):  CKTOTAL, CKMB, CKMBINDEX, TROPONINI in the last 168 hours. BNP (last 3 results) No results for input(s): PROBNP in the last 8760 hours. HbA1C: No results for input(s): HGBA1C in the last 72 hours. CBG: No results for input(s): GLUCAP in the last 168 hours. Lipid Profile: No results for input(s): CHOL, HDL, LDLCALC, TRIG, CHOLHDL, LDLDIRECT in the last 72 hours. Thyroid Function Tests: No results for input(s): TSH, T4TOTAL, FREET4, T3FREE, THYROIDAB in the last 72 hours. Anemia Panel: No results for input(s): VITAMINB12, FOLATE, FERRITIN, TIBC, IRON, RETICCTPCT in the last 72 hours. Urine analysis:    Component Value Date/Time   COLORURINE YELLOW 08/20/2016 0424   APPEARANCEUR CLEAR 08/20/2016 0424   LABSPEC 1.016 08/20/2016 0424   PHURINE 5.0 08/20/2016 0424   GLUCOSEU NEGATIVE 08/20/2016 0424   HGBUR NEGATIVE 08/20/2016 0424   HGBUR negative 08/31/2009 0927   BILIRUBINUR neg 10/02/2018 1058   KETONESUR NEGATIVE 08/20/2016 0424   PROTEINUR Negative 10/02/2018 1058   PROTEINUR NEGATIVE 08/20/2016 0424   UROBILINOGEN 0.2 10/02/2018 1058   UROBILINOGEN 0.2 10/04/2013 2030   NITRITE neg 10/02/2018 1058   NITRITE NEGATIVE 08/20/2016 0424   LEUKOCYTESUR Negative 10/02/2018 1058   Sepsis Labs: !!!!!!!!!!!!!!!!!!!!!!!!!!!!!!!!!!!!!!!!!!!! @LABRCNTIP (procalcitonin:4,lacticidven:4) )No results found for this or any previous visit (from the past 240 hour(s)).   Radiological Exams on Admission: CT Abdomen Pelvis W Contrast  Result Date: 09/04/2020 CLINICAL DATA:  A 87 year old female with history of abdominal trauma by report., fall from standing. EXAM: CT ABDOMEN AND PELVIS WITH CONTRAST TECHNIQUE: Multidetector CT imaging of the abdomen and pelvis was performed using the standard protocol following bolus administration of intravenous contrast. CONTRAST:  80 mL Omnipaque 300 COMPARISON:  July 08, 2019 FINDINGS: Lower chest: Trace amount of pleural fluid in the RIGHT chest. Heart size  is enlarged with signs of coronary artery disease, incompletely imaged. Hepatobiliary: No focal, suspicious hepatic lesion. No pericholecystic stranding. No biliary duct dilation. Portal vein is patent. Stable partially calcified lesion in the RIGHT hemi liver measuring approximately 2.1 cm Pancreas: Normal, without mass, inflammation or ductal dilatation. Spleen: Normal Adrenals/Urinary Tract: Adrenal glands normal. Symmetric renal enhancement. No hydronephrosis. Urinary bladder with smooth contour. Renal cortical scarring on the LEFT is moderate and unchanged. Stomach/Bowel: Small hiatal hernia. No acute gastrointestinal process. Metallic clip in the rectum perhaps hemostatic  clip or clip from polypectomy. No surrounding stranding. Vascular/Lymphatic: Calcified noncalcified atheromatous plaque of the abdominal aorta. There is no gastrohepatic or hepatoduodenal ligament lymphadenopathy. No retroperitoneal or mesenteric lymphadenopathy. No pelvic sidewall lymphadenopathy. Reproductive: Post hysterectomy Other: No ascites.  No free air. Musculoskeletal: Osteopenia. Compression fracture at the L1 level is new with approximately 10% loss of height. L2 and L3 compression deformities with similar appearance compared to previous imaging, see dedicated lumbar spine evaluation for further detail. IMPRESSION: 1. Positive for acute compression fracture at the L1 level. Approximately 10%-20% loss of height. See dedicated lumbar spine evaluation for further detail. 2. Redemonstration of compression fractures at L2 and L3. 3. No acute intra-abdominal findings. 4. Signs of hemostatic clip placed in the rectum during previous colonoscopy. 5. Trace amount of pleural fluid in the RIGHT chest. 6. Stable calcified lesion in the RIGHT hepatic lobe, unchanged since December of 2020 and favored to represent a benign lesion such is atypical sclerosed hemangioma based on appearance on the prior study. 7. Aortic atherosclerosis. Aortic  Atherosclerosis (ICD10-I70.0). Electronically Signed   By: Zetta Bills M.D.   On: 09/04/2020 15:58   CT L-SPINE NO CHARGE  Result Date: 09/04/2020 CLINICAL DATA:  Status post fall.  Low back pain. EXAM: CT LUMBAR SPINE WITHOUT CONTRAST TECHNIQUE: Multidetector CT imaging of the lumbar spine was performed without intravenous contrast administration. Multiplanar CT image reconstructions were also generated. COMPARISON:  07/08/2019 FINDINGS: Segmentation: 5 lumbar type vertebrae. Alignment: Minimal grade 1 anterolisthesis of L3 on L4 secondary to facet disease. Vertebrae: Acute minimal compression fracture of the L1 vertebral body without significant height loss. Chronic L2 vertebral body compression fracture with approximately 40% height loss. Chronic L3 vertebral body compression fracture with approximately 30% height loss. No discitis or osteomyelitis. Paraspinal and other soft tissues: No acute paraspinal abnormality. Abdominal aortic atherosclerosis. Trace right pleural effusion. Disc levels: Degenerative disease with disc height loss at L4-5 and L5-S1. Bilateral facet arthropathy throughout the lumbar spine. Bilateral foraminal narrowing at L4-5. Moderate spinal stenosis at L3-4. Mild spinal stenosis at L4-5. IMPRESSION: 1. Acute minimal compression fracture of the L1 vertebral body without significant height loss. 2. Chronic L2 and L3 vertebral body compression fractures. 3. Aortic Atherosclerosis (ICD10-I70.0). Electronically Signed   By: Kathreen Devoid   On: 09/04/2020 16:12   DG Hip Unilat W or Wo Pelvis 2-3 Views Left  Result Date: 09/04/2020 CLINICAL DATA:  Left hip pain after fall. EXAM: DG HIP (WITH OR WITHOUT PELVIS) 2-3V LEFT COMPARISON:  None. FINDINGS: There is no evidence of hip fracture or dislocation. Moderate osteophyte formation is seen involving the left hip. 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 No acute abnormality seen. Electronically Signed   By: Marijo Conception M.D.   On: 09/04/2020 14:55     All images have been reviewed by me personally.  EKG: Independently reviewed.   Assessment/Plan Principal Problem:   Closed compression fracture of body of L1 vertebra (HCC) Active Problems:   HLD (hyperlipidemia)   Essential hypertension   External hemorrhoids   Diverticulosis of colon   Cerebrovascular accident (CVA) ([place])   Hypertension   Rectal bleeding   Fall at home, initial encounter   Acute on chronic blood loss anemia   Assessment  plan  #Acute L1 vertebral body compression fracture # s/p mechanical fall POA #History of L2 and L3 compression fracture noted on CT scan  - pain management and PT eval - ED consulted neurosurgery to see in am -  UA pending   #Chronic intermittent BRBPR #Acute on chronic blood loss anemia #Hemorrhoids and diverticulosis   -Reports worsening of BRBPR over last week -Hemoglobin 9.0 (BL 12.4) -Trend H&H -Hold home Eliquis -Medicine for hemorrhoids - colonoscopy in 2020 noted.  -Would recommend conservative management and monitoring for now.   # Hx of CVA on Eliquis  -Hold Eliquis due to rectal bleed  # HTN and HLD  -Chronic and stable      DVT prophylaxis: SCD Code Status: DNR Family Communication: none at bedside Consults called: neurosurgery Admission status: obs  Status is: Observation  The patient remains OBS appropriate and will d/c before 2 midnights.  Dispo: The patient is from: Home              Anticipated d/c is to: Home              Anticipated d/c date is: 2 days              Patient currently is not medically stable to d/c.   Difficult to place patient No       Time Spent: 65 minutes.  >50% of the time was devoted to discussing the patients care, assessment, plan and disposition with other care givers along with counseling the patient about the risks and benefits of treatment.    Charlann Lange MD [facility]  If 7PM-7AM, please contact  night-coverage   09/04/2020, 9:36 PM

## 2020-09-04 NOTE — Consult Note (Signed)
   Providing Compassionate, Quality Care - Together  Neurosurgery Consult  Referring physician: Dr. Nicoletta Dress Reason for referral: L1 compression fracture  Chief Complaint: Fall  History of Present Illness: This is a 87 year old female with a history of CVA, hypertension, hyperlipidemia, GERD that presented after a fall at home and complains of lumbar pain. She states she fell straight down on her buttocks and started having low back pain afterwards. She also complains of pain that radiates around into her right hip. She denies any numbness, tingling, weakness, bowel or bladder changes. She does complain of some chronic low back pain prior to her fall. Her pain is significant and worse with movement in her low back and radiating around her right lumbar region. She denies any radiation into her legs. She denies any groin numbness.   Medications: I have reviewed the patient's current medications. Allergies: No Known Allergies  History reviewed. No pertinent family history. Social History:  has no history on file for tobacco use, alcohol use, and drug use.  ROS: 14 point review of systems was obtained, all pertinent positives and negatives are listed in HPI above  Physical Exam:  Vital signs in last 24 hours: Temp:  [98 F (36.7 C)-98.3 F (36.8 C)] 98 F (36.7 C) (07/25 1814) Pulse Rate:  [58-128] 65 (07/26 0746) Resp:  [11-18] 14 (07/26 0217) BP: (138-182)/(65-125) 153/88 (07/26 0700) SpO2:  [91 %-98 %] 96 % (07/26 0746) PE: A&O x3 Speech fluent and appropriate Face symmetric PERRLA Moving all extremities equally Sensory intact to light touch Significant tenderness to palpation over upper lumbar spine at midline    Impression/Assessment:  87 year old female with  1. L1 acute compression fracture, chronic L2, L3 compression fractures  Plan:  -I ordered an LSO brace and would like to have the patient attempt to work with therapy. If her pain is intractable even with the brace I  will order an MRI L-spine for evaluation of her fracture. She may be a candidate for kyphoplasty which I did discuss with the patient if she fails pain control with brace. -PT/OT with brace -Pain control -Will continue to follow   Thank you for allowing me to participate in this patient's care.  Please do not hesitate to call with questions or concerns.   Elwin Sleight, [facility] Cell: [phone]

## 2020-09-04 NOTE — ED Triage Notes (Signed)
Patient BIB [facility] from home. Complaint of a fall today with hip pain. Patient states she turned to put something back in the fridge and fell. Patient is on eliquis. Given 142mcg fentanyl via EMS. VSS. NAD.

## 2020-09-04 NOTE — ED Provider Notes (Signed)
[facility] EMERGENCY DEPARTMENT Provider Note   CSN: [id number] Arrival date & time: 09/04/20  1338     History Chief Complaint  Patient presents with  . Fall    Christine Middleton is a 87 y.o. female.  Patient is an 87 year old female who presents after a fall.  Of note she is on Eliquis.  She reports that she was getting something out of the refrigerator and fell straight down onto her buttocks.  She denies hitting her head.  There is no loss of consciousness.  She does not know exactly what made her fall but she feels like she just lost her balance.  She did not have any palpitations, dizziness, chest pain, shortness of breath or other symptoms preceding the fall.  She complains of pain in her mid back and in her abdomen since a fall which happened just prior to arrival.  She denies any neck pain.  No headache.  No nausea or vomiting.  No other injuries.  She denies any recent illnesses or injuries.        Past Medical History:  Diagnosis Date  . Diverticulitis   . Esophageal stricture   . GERD (gastroesophageal reflux disease)   . Hyperlipidemia   . Hypertension   . Shingles   . Stroke ([place])   . Vitamin B 12 deficiency     Patient Active Problem List   Diagnosis Date Noted  . Rectal bleeding 07/08/2019  . Depression with anxiety 09/26/2017  . Hypertension 01/17/2017  . Pedal edema 10/18/2016  . TMJ (dislocation of temporomandibular joint) 09/18/2016  . 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  . Cerebrovascular accident (CVA) ([place])   . Embolic stroke ([place]) 62/83/1517  . Facial droop 08/18/2016  . Cerebral infarction ([place]) 10/04/2013  . HYPERGLYCEMIA 06/30/2010  . Cerebral artery occlusion with cerebral infarction ([place]) 07/19/2009  . Disorder resulting from impaired renal function 07/19/2009  . Polymyalgia rheumatica ([place]) 07/19/2009  . VITAMIN B12 DEFICIENCY 06/30/2009  . Anxiety state 06/28/2009  . Hereditary and idiopathic peripheral neuropathy 06/28/2009  .  External hemorrhoids 12/03/2008  . ESOPHAGEAL STRICTURE 11/30/2008  . DIVERTICULOSIS, COLON 11/30/2008  . GROSS HEMATURIA 11/04/2008  . MYALGIA 11/04/2008  . Constipation 06/24/2008  . ACUTE CYSTITIS 03/12/2008  . HLD (hyperlipidemia) 05/30/2007  . SKIN LESION 05/30/2007  . Essential hypertension 03/19/2007  . GERD 03/19/2007    Past Surgical History:  Procedure Laterality Date  . ABDOMINAL HYSTERECTOMY    . APPENDECTOMY    . COLONOSCOPY  12-09-08   per Dr. Sharlett Iles (incomplete along with barium enema 12-10-08) with hemorrhoids  and diverticulae  . COLONOSCOPY WITH PROPOFOL N/A 07/10/2019   Procedure: COLONOSCOPY WITH PROPOFOL;  Surgeon: Rush Landmark Telford Nab., MD;  Location: [facility] ENDOSCOPY;  Service: Gastroenterology;  Laterality: N/A;  . ESOPHAGOGASTRODUODENOSCOPY  09-2004   per Dr. Sharlett Iles with dilatation   . EYE SURGERY    . HEMOSTASIS CLIP PLACEMENT  07/10/2019   Procedure: HEMOSTASIS CLIP PLACEMENT;  Surgeon: Irving Copas., MD;  Location: [facility];  Service: Gastroenterology;;  . POLYPECTOMY  07/10/2019   Procedure: POLYPECTOMY;  Surgeon: Irving Copas., MD;  Location: [facility];  Service: Gastroenterology;;  . TONSILLECTOMY       OB History   No obstetric history on file.     Family History  Problem Relation Age of Onset  . Heart attack Mother   . Heart disease Father   . Breast cancer Other   . Diabetes Other   . Hypertension Other   .  Stroke Other   . Heart disease Other   . Stroke Sister   . Cancer Brother   . Cancer Brother   . Diabetes Brother     Social History   Tobacco Use  . Smoking status: Never Smoker  . Smokeless tobacco: Never Used  Vaping Use  . Vaping Use: Never used  Substance Use Topics  . Alcohol use: No    Alcohol/week: 0.0 standard drinks  . Drug use: No    Home Medications Prior to Admission medications   Medication Sig Start Date End Date Taking? Authorizing Provider  acetaminophen (TYLENOL) 325  MG tablet Take 325 mg by mouth every 6 (six) hours as needed for mild pain or headache.    [provider]  amLODipine (NORVASC) 5 MG tablet TAKE 1 TABLET BY MOUTH EVERY DAY 10/23/19   Laurey Morale, MD  atenolol (TENORMIN) 50 MG tablet TAKE 2 TABLETS BY MOUTH EVERY DAY 10/23/19   Laurey Morale, MD  ELIQUIS 2.5 MG TABS tablet TAKE 1 TABLET BY MOUTH TWICE A DAY 09/25/19   Martinique, Peter M, MD  hydrocortisone (ANUSOL-HC) 25 MG suppository Place 1 suppository (25 mg total) rectally 2 (two) times daily. 08/26/20   Laurey Morale, MD  losartan (COZAAR) 100 MG tablet TAKE 1 TABLET BY MOUTH EVERY DAY 07/07/20   Laurey Morale, MD  pantoprazole (PROTONIX) 40 MG tablet Take 1 tablet (40 mg total) by mouth daily. 11/07/19   Laurey Morale, MD  polycarbophil (FIBERCON) 625 MG tablet Take 625 mg by mouth daily.    [provider]  polyethylene glycol (MIRALAX / GLYCOLAX) 17 g packet Take 17 g by mouth daily. 07/12/19   Harold Hedge, MD  simvastatin (ZOCOR) 20 MG tablet TAKE 1 TABLET BY MOUTH EVERYDAY AT BEDTIME 08/17/20   Laurey Morale, MD    Allergies    Amoxicillin, Codeine, Lipitor [atorvastatin], Lisinopril, and Nitrofurantoin  Review of Systems   Review of Systems  Constitutional: Negative for chills, diaphoresis, fatigue and fever.  HENT: Negative for congestion, rhinorrhea and sneezing.   Eyes: Negative.   Respiratory: Negative for cough, chest tightness and shortness of breath.   Cardiovascular: Negative for chest pain and leg swelling.  Gastrointestinal: Positive for abdominal pain. Negative for blood in stool, diarrhea, nausea and vomiting.  Genitourinary: Negative for difficulty urinating, flank pain, frequency and hematuria.  Musculoskeletal: Positive for back pain. Negative for arthralgias.  Skin: Negative for rash.  Neurological: Negative for dizziness, speech difficulty, weakness, numbness and headaches.    Physical Exam Updated Vital Signs BP (!) 123/49   Pulse 77    Temp (!) 97.5 F (36.4 C) (Oral)   Resp 19   SpO2 95%   Physical Exam Constitutional:      Appearance: She is well-developed and well-nourished.  HENT:     Head: Normocephalic and atraumatic.  Eyes:     Pupils: Pupils are equal, round, and reactive to light.  Neck:     Comments: No pain along the cervical or thoracic spine.  There is tenderness along the upper mid lumbosacral spine.  No step-offs or deformities are noted. Cardiovascular:     Rate and Rhythm: Normal rate and regular rhythm.     Heart sounds: Normal heart sounds.  Pulmonary:     Effort: Pulmonary effort is normal. No respiratory distress.     Breath sounds: Normal breath sounds. No wheezing or rales.  Chest:     Chest wall: No tenderness.  Abdominal:     General: Bowel sounds are normal.     Palpations: Abdomen is soft.     Tenderness: There is no abdominal tenderness. There is no guarding or rebound.  Musculoskeletal:        General: No edema. Normal range of motion.     Cervical back: Normal range of motion and neck supple.     Comments: There is mild pain on range of motion of the left hip.  There is no other pain on palpation or range of motion of the extremities.  She has a minor abrasion to her left hand but there is no underlying bony tenderness.  Lymphadenopathy:     Cervical: No cervical adenopathy.  Skin:    General: Skin is warm and dry.     Findings: No rash.  Neurological:     Mental Status: She is alert and oriented to person, place, and time.  Psychiatric:        Mood and Affect: Mood and affect normal.     ED Results / Procedures / Treatments   Labs (all labs ordered are listed, but only abnormal results are displayed) Labs Reviewed  COMPREHENSIVE METABOLIC PANEL - Abnormal; Notable for the following components:      Result Value   Glucose, Bld 148 (*)    Creatinine, Ser 1.24 (*)    Calcium 8.5 (*)    Total Protein 6.1 (*)    Albumin 3.2 (*)    GFR, Estimated 40 (*)    All other  components within normal limits  CBC WITH DIFFERENTIAL/PLATELET - Abnormal; Notable for the following components:   WBC 14.2 (*)    RBC 3.17 (*)    Hemoglobin 9.0 (*)    HCT 29.3 (*)    Neutro Abs 12.0 (*)    Monocytes Absolute 1.1 (*)    Abs Immature Granulocytes 0.12 (*)    All other components within normal limits  I-STAT CHEM 8, ED - Abnormal; Notable for the following components:   Creatinine, Ser 1.20 (*)    Glucose, Bld 144 (*)    Calcium, Ion 1.10 (*)    Hemoglobin 8.8 (*)    HCT 26.0 (*)    All other components within normal limits  URINALYSIS, ROUTINE W REFLEX MICROSCOPIC    EKG EKG Interpretation  Date/Time:  Saturday September 04 2020 13:51:49 EST Ventricular Rate:  78 PR Interval:    QRS Duration: 99 QT Interval:  404 QTC Calculation: 461 R Axis:   -24 Text Interpretation: Sinus rhythm Borderline left axis deviation Anterior infarct, old since last tracing no significant change Confirmed by Malvin Johns [phone]) on 09/04/2020 2:25:55 PM   Radiology DG Hip Unilat W or Wo Pelvis 2-3 Views Left  Result Date: 09/04/2020 CLINICAL DATA:  Left hip pain after fall. EXAM: DG HIP (WITH OR WITHOUT PELVIS) 2-3V LEFT COMPARISON:  None. FINDINGS: There is no evidence of hip fracture or dislocation. Moderate osteophyte formation is seen involving the left hip. 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 No acute abnormality seen. Electronically Signed   By: Marijo Conception M.D.   On: 09/04/2020 14:55    Procedures Procedures   Medications Ordered in ED Medications - No data to display  ED Course  I have reviewed the triage vital signs and the nursing notes.  Pertinent labs & imaging results that were available during my care of the patient were reviewed by me and considered in my medical decision making (see chart  for details).    MDM Rules/Calculators/A&P                          Patient presents after a fall.  She was is complaining of severe  pain in her abdomen and her mid back.  She is on Eliquis.  She denies any head injury.  She does have some mild pain to her hip.  Imaging studies are pending.  Dr. Ralene Bathe to take over care pending imaging. Final Clinical Impression(s) / ED Diagnoses Final diagnoses:  None    Rx / DC Orders ED Discharge Orders    None       Malvin Johns, MD 09/04/20 [phone]

## 2020-09-05 ENCOUNTER — Observation Stay (HOSPITAL_COMMUNITY): Payer: Medicare Other

## 2020-09-05 DIAGNOSIS — M25551 Pain in right hip: Secondary | ICD-10-CM | POA: Diagnosis not present

## 2020-09-05 DIAGNOSIS — S32010A Wedge compression fracture of first lumbar vertebra, initial encounter for closed fracture: Secondary | ICD-10-CM | POA: Diagnosis not present

## 2020-09-05 LAB — URINALYSIS, ROUTINE W REFLEX MICROSCOPIC
Bilirubin Urine: NEGATIVE
Glucose, UA: NEGATIVE mg/dL
Hgb urine dipstick: NEGATIVE
Ketones, ur: 20 mg/dL — AB
Leukocytes,Ua: NEGATIVE
Nitrite: NEGATIVE
Protein, ur: NEGATIVE mg/dL
Specific Gravity, Urine: 1.041 — ABNORMAL HIGH (ref 1.005–1.030)
pH: 5 (ref 5.0–8.0)

## 2020-09-05 LAB — SARS CORONAVIRUS 2 (TAT 6-24 HRS): SARS Coronavirus 2: NEGATIVE

## 2020-09-05 LAB — HEMOGLOBIN AND HEMATOCRIT, BLOOD
HCT: 28.9 % — ABNORMAL LOW (ref 36.0–46.0)
Hemoglobin: 9.5 g/dL — ABNORMAL LOW (ref 12.0–15.0)

## 2020-09-05 MED ORDER — POLYETHYLENE GLYCOL 3350 17 G PO PACK
17.0000 g | PACK | Freq: Every day | ORAL | Status: DC
  Administered 2020-09-06: 17 g via ORAL
  Filled 2020-09-05: qty 1

## 2020-09-05 MED ORDER — LIDOCAINE 5 % EX PTCH
1.0000 | MEDICATED_PATCH | CUTANEOUS | Status: DC
  Administered 2020-09-05 – 2020-09-06 (×2): 1 via TRANSDERMAL
  Filled 2020-09-05 (×2): qty 1

## 2020-09-05 MED ORDER — TRAMADOL HCL 50 MG PO TABS: 50.0000 mg | ORAL_TABLET | Freq: Four times a day (QID) | ORAL | Status: DC | PRN

## 2020-09-05 MED ORDER — TRAMADOL HCL 50 MG PO TABS
50.0000 mg | ORAL_TABLET | Freq: Two times a day (BID) | ORAL | Status: DC | PRN
  Administered 2020-09-05 – 2020-09-06 (×2): 50 mg via ORAL
  Filled 2020-09-05 (×2): qty 1

## 2020-09-05 NOTE — Progress Notes (Signed)
Occupational Therapy Evaluation Patient Details Name: Christine Middleton MRN: [id number] DOB: 07-Nov-1922 Today's Date: 09/05/2020    History of Present Illness 87 y.o. female with medical history significant of previous CVA, HTN, HLD, GERD who presented 09/04/20 with  Fall and back pain. CT showed Acute minimal compression fracture of the L1 vertebral body; Chronic L2 and L3 vertebral body compression fractures   Clinical Impression   Pt admitted with above. She demonstrates the below listed deficits and will benefit from continued OT to maximize safety and independence with BADLs.  Pt presents to OT with increased pain, and decreased activity tolerance.  She was able to move to EOB with min A, and stand with min A +2, but pain limited her ability to perform LB ADLs or functional transfers.  At this time, I don't think she will be able to tolerate the ride home.  She lives with her brother, and has family that can provide 24 hour assist.  She was fully independent PTA - recommend good progress.  Will follow acutely.       Follow Up Recommendations  Home health OT;Supervision/Assistance - 24 hour    Equipment Recommendations  Tub/shower bench    Recommendations for Other Services       Precautions / Restrictions Precautions Precautions: Fall;Back Precaution Booklet Issued: No Precaution Comments: Pt instructed in log rolling Required Braces or Orthoses: Spinal Brace      Mobility Bed Mobility Overal bed mobility: Needs Assistance Bed Mobility: Rolling;Sidelying to Sit;Sit to Sidelying Rolling: Min assist Sidelying to sit: Min assist     Sit to sidelying: Max assist General bed mobility comments: Pt instructed in log roll technique.  Assist to guide LEs with log rolling, and min A to lift trunk.  Assist to lower trunk and lift LEs onto stretcher    Transfers Overall transfer level: Needs assistance Equipment used: 2 person hand held assist Transfers: Sit to/from Stand Sit to  Stand: Min assist;+2 physical assistance;+2 safety/equipment         General transfer comment: Pt required assist to boost into standing and assist to side step up the EOB.  Pt reports pain to intense to attempt transfers or ambulation    Balance Overall balance assessment: Needs assistance Sitting-balance support: Single extremity supported Sitting balance-Leahy Scale: Poor Sitting balance - Comments: requires UE support to brace self due to pain   Standing balance support: Bilateral upper extremity supported Standing balance-Leahy Scale: Poor Standing balance comment: requires UE support                           ADL either performed or assessed with clinical judgement   ADL Overall ADL's : Needs assistance/impaired Eating/Feeding: Independent   Grooming: Wash/dry hands;Wash/dry face;Oral care;Brushing hair;Set up;Bed level   Upper Body Bathing: Minimal assistance;Sitting;Bed level   Lower Body Bathing: Total assistance;Sit to/from stand   Upper Body Dressing : Moderate assistance;Sitting   Lower Body Dressing: Total assistance;Sit to/from stand   Toilet Transfer: Total assistance Toilet Transfer Details (indicate cue type and reason): unable to attempt due to pain         Functional mobility during ADLs: Minimal assistance;+2 for physical assistance General ADL Comments: limited by pain     Vision         Perception     Praxis      Pertinent Vitals/Pain Pain Assessment: Faces Faces Pain Scale: Hurts whole lot Pain Location: back Pain Descriptors / Indicators: Aching  Pain Intervention(s): Limited activity within patient's tolerance;Repositioned     Hand Dominance Right   Extremity/Trunk Assessment Upper Extremity Assessment Upper Extremity Assessment: Overall WFL for tasks assessed   Lower Extremity Assessment Lower Extremity Assessment: Defer to PT evaluation   Cervical / Trunk Assessment Cervical / Trunk Assessment: Other  exceptions Cervical / Trunk Exceptions: s/p compression fracture   Communication Communication Communication: HOH   Cognition Arousal/Alertness: Awake/alert Behavior During Therapy: WFL for tasks assessed/performed Overall Cognitive Status: Within Functional Limits for tasks assessed                                     General Comments  Sp02 82-93% on RA with good pleth, but pt with fingernail polish on.  DOE 0/4 - MD aware.  Orthostatic BPs monitored but stable with no drop.    Exercises     Shoulder Instructions      Home Living Family/patient expects to be discharged to:: Private residence Living Arrangements: Other relatives Available Help at Discharge: Family;Available 24 hours/day Type of Home: House Home Access: Ramped entrance     Home Layout: Multi-level;Able to live on main level with bedroom/bathroom     Bathroom Shower/Tub: Teacher, early years/pre: Standard     Home Equipment: Grab bars - toilet;Shower seat;Walker - 2 wheels;Walker - 4 wheels   Additional Comments: lives with brother, who has polio.  Her son and daughter will provide 24 hour assist      Prior Functioning/Environment Level of Independence: Independent        Comments: Pt was fully independent with no AD PTA.        OT Problem List: Decreased activity tolerance;Impaired balance (sitting and/or standing);Decreased knowledge of use of DME or AE;Decreased knowledge of precautions;Pain      OT Treatment/Interventions: Self-care/ADL training;DME and/or AE instruction;Therapeutic activities;Patient/family education;Balance training    OT Goals(Current goals can be found in the care plan section) Acute Rehab OT Goals Patient Stated Goal: to go home and have less pain OT Goal Formulation: With patient/family Time For Goal Achievement: 09/19/20 Potential to Achieve Goals: Good ADL Goals Pt Will Perform Grooming: with min guard assist;standing Pt Will Perform  Upper Body Bathing: with supervision;with set-up;sitting Pt Will Perform Lower Body Bathing: with min guard assist;sit to/from stand;with adaptive equipment Pt Will Perform Upper Body Dressing: with supervision;with set-up;sitting Pt Will Perform Lower Body Dressing: with min guard assist;with adaptive equipment;sit to/from stand Pt Will Transfer to Toilet: with min guard assist;ambulating;regular height toilet;bedside commode;grab bars Pt Will Perform Toileting - Clothing Manipulation and hygiene: with min guard assist;sit to/from stand Pt Will Perform Tub/Shower Transfer: Tub transfer;with min guard assist;ambulating;tub bench;rolling walker  OT Frequency: Min 2X/week   Barriers to D/C:            Co-evaluation              AM-PAC OT "6 Clicks" Daily Activity     Outcome Measure Help from another person eating meals?: None Help from another person taking care of personal grooming?: A Little Help from another person toileting, which includes using toliet, bedpan, or urinal?: Total Help from another person bathing (including washing, rinsing, drying)?: A Lot Help from another person to put on and taking off regular upper body clothing?: A Lot Help from another person to put on and taking off regular lower body clothing?: Total 6 Click Score: 13   End of  Session Equipment Utilized During Treatment: Back brace Nurse Communication: Mobility status  Activity Tolerance: Patient limited by pain Patient left: in bed;with call bell/phone within reach;with family/visitor present  OT Visit Diagnosis: Pain Pain - part of body:  (back)                Time: 1610-9604 OT Time Calculation (min): 34 min Charges:  OT General Charges $OT Visit: 1 Visit OT Evaluation $OT Eval Moderate Complexity: 1 Mod  Nilsa Nutting., OTR/L Acute Rehabilitation Services Pager [phone] Office [phone]   Lucille Passy M 09/05/2020, 4:57 PM

## 2020-09-05 NOTE — ED Notes (Signed)
Lunch Tray Ordered @ 1105. 

## 2020-09-05 NOTE — Progress Notes (Signed)
PT Cancellation Note  Patient Details Name: DEVINNE EPSTEIN MRN: [id number] DOB: 01/06/1923   Cancelled Treatment:    Reason Eval/Treat Not Completed: Other (comment)  Back brace not yet delivered. Contacted [employer] for back brace and they are coming soon to fit pt.   Arby Barrette, PT Pager [phone]   Jeanie Cooks Delcenia Inman 09/05/2020, 1:28 PM

## 2020-09-05 NOTE — ED Notes (Signed)
Pt is sitting up to eat breakfast. Pt has call bell in reach.

## 2020-09-05 NOTE — Progress Notes (Signed)
PROGRESS NOTE    Christine Middleton  MWN:[id number] DOB: [birth year] DOA: 09/04/2020 PCP: Laurey Morale, MD    Brief Narrative:  87 year old very functional female with history of strokes, paroxysmal A. fib on Eliquis, hypertension, hyperlipidemia and GERD, internal hemorrhoids presented to the ER with fall and back pain.  Lives at home with her son.  She suddenly fell when she was trying to put something back in the refrigerator.  Had buttock pain and lower back pain.  In the emergency room, patient is fairly stable.  Skeletal survey was otherwise negative.  She was found to have acute L1 compression fracture.  Old L2 and L3 compression fractures.  She is also recently having rectal bleed associated with hemorrhoids.   Assessment & Plan:   Principal Problem:   Closed compression fracture of body of L1 vertebra (HCC) Active Problems:   HLD (hyperlipidemia)   Essential hypertension   External hemorrhoids   Diverticulosis of colon   Cerebrovascular accident (CVA) ([place])   Hypertension   Rectal bleeding   Fall at home, initial encounter   Acute on chronic blood loss anemia  Acute traumatic L1 vertebral fracture.  Osteoporotic compression fracture: CT scan with evidence of minimal compression.  Patient with no neurological deficit. Expecting conservative management. Continue Tylenol, tramadol and fentanyl as needed.  Lidocaine patch application. Work with PT OT. Seen by neurosurgery, they recommended LSO bracing on mobility. Anticipate home with home health PT OT as she has adequate support at home.  Chronic intermittent rectal bleed: Colonoscopy 2 years ago with evidence of internal hemorrhoids and diverticulosis and rectosigmoid stricture.  No indication to repeat colonoscopy. Baseline hemoglobin about 11.  Hemoglobin 9.5 today. Using steroid suppository that we will continue. Holding home Eliquis, however benefits still outweigh risk of outlet bleeding.  We will continue.  Paroxysmal A.  fib on Eliquis: Rate controlled.  Sinus rhythm. Difficult decision to continuation of Eliquis in the context of ongoing mild intermittent rectal bleeding. She has history of multiple strokes. Discontinuation of Eliquis can precipitate a stroke. Risk versus benefit discussion done with patient and family. At this time, patient does not have ongoing active bleeding.  Will treat symptomatically with laxatives and stool softener, steroid suppository and continue Eliquis. If she continues to bleed in the context of taking Eliquis, then we need to consider discontinuation of therapy.  Hypertension: Blood pressure stable.  Resume home medications.  Hyperlipidemia: On statin.   DVT prophylaxis: Place and maintain sequential compression device Start: 09/04/20 2111 SCDs Start: 09/04/20 2055   Code Status: DNR Family Communication: Patient's daughter at the bedside Disposition Plan: Status is: Observation  The patient will require care spanning > 2 midnights and should be moved to inpatient because: Inpatient level of care appropriate due to severity of illness  Dispo: The patient is from: Home              Anticipated d/c is to: Home              Anticipated d/c date is: 2 days              Patient currently is not medically stable to d/c.   Difficult to place patient No  Patient was admitted to observation with lumbar spine fracture.  Monitored patient working with physical therapy and Occupational Therapy.  Patient started having severe pain on mobility and was not able to be safely mobilized and independently discharged home. We will continue to use local pain patch, oral and  IV opiates and work with therapies again tomorrow before discharge.  Consultants:   Neurosurgery  Procedures:   None  Antimicrobials:   None   Subjective: Patient was seen and examined.  Early morning-I examined the patient, she was lying in bed and without much complaint. Examined her later in the  evening, her daughter at the bedside. Patient started working with therapies and had severe pain on mobility.  She also had some right-sided hip pain.  X-ray done ruled out any acute fracture. She has just in the hospital because of ongoing pain management.  Objective: Vitals:   09/05/20 1330 09/05/20 1500 09/05/20 1530 09/05/20 1630  BP: 127/72 (!) 162/62 (!) 143/61 (!) 128/50  Pulse: 81     Resp: (!) 23 18 19  (!) 21  Temp:      TempSrc:      SpO2: 91%      No intake or output data in the 24 hours ending 09/05/20 1645 There were no vitals filed for this visit.  Examination:  General exam: Appears calm and comfortable at rest. In moderate distress on attempted mobility. Respiratory system: Clear to auscultation. Respiratory effort normal.  No added sounds. Cardiovascular system: S1 & S2 heard, RRR. No JVD, murmurs, rubs, gallops or clicks. No pedal edema. Gastrointestinal system: Abdomen is nondistended, soft and nontender. No organomegaly or masses felt. Normal bowel sounds heard. Central nervous system: Alert and oriented. No focal neurological deficits. Extremities: Symmetric 5 x 5 power. She does have some localized tenderness on mobility at her mid back.  Some vague tenderness along the right pelvis.    Data Reviewed: I have personally reviewed following labs and imaging studies  CBC: Recent Labs  Lab 09/04/20 1421 09/04/20 1429 09/04/20 2114 09/05/20 0414  WBC 14.2*  --   --   --   NEUTROABS 12.0*  --   --   --   HGB 9.0* 8.8* 9.3* 9.5*  HCT 29.3* 26.0* 28.5* 28.9*  MCV 92.4  --   --   --   PLT 236  --   --   --    Basic Metabolic Panel: Recent Labs  Lab 09/04/20 1421 09/04/20 1429  NA 138 138  K 4.0 3.9  CL 104 106  CO2 22  --   GLUCOSE 148* 144*  BUN 22 23  CREATININE 1.24* 1.20*  CALCIUM 8.5*  --    GFR: Estimated Creatinine Clearance: 24.1 mL/min (A) (by C-G formula based on SCr of 1.2 mg/dL (H)). Liver Function Tests: Recent Labs  Lab  09/04/20 1421  AST 23  ALT 21  ALKPHOS 79  BILITOT 1.2  PROT 6.1*  ALBUMIN 3.2*   No results for input(s): LIPASE, AMYLASE in the last 168 hours. No results for input(s): AMMONIA in the last 168 hours. Coagulation Profile: No results for input(s): INR, PROTIME in the last 168 hours. Cardiac Enzymes: No results for input(s): CKTOTAL, CKMB, CKMBINDEX, TROPONINI in the last 168 hours. BNP (last 3 results) No results for input(s): PROBNP in the last 8760 hours. HbA1C: No results for input(s): HGBA1C in the last 72 hours. CBG: No results for input(s): GLUCAP in the last 168 hours. Lipid Profile: No results for input(s): CHOL, HDL, LDLCALC, TRIG, CHOLHDL, LDLDIRECT in the last 72 hours. Thyroid Function Tests: No results for input(s): TSH, T4TOTAL, FREET4, T3FREE, THYROIDAB in the last 72 hours. Anemia Panel: No results for input(s): VITAMINB12, FOLATE, FERRITIN, TIBC, IRON, RETICCTPCT in the last 72 hours. Sepsis Labs: No results for  input(s): PROCALCITON, LATICACIDVEN in the last 168 hours.  Recent Results (from the past 240 hour(s))  SARS CORONAVIRUS 2 (TAT 6-24 HRS) Nasopharyngeal Nasopharyngeal Swab     Status: None   Collection Time: 09/04/20  5:57 PM   Specimen: Nasopharyngeal Swab  Result Value Ref Range Status   SARS Coronavirus 2 NEGATIVE NEGATIVE Final    Comment: (NOTE) SARS-CoV-2 target nucleic acids are NOT DETECTED.  The SARS-CoV-2 RNA is generally detectable in upper and lower respiratory specimens during the acute phase of infection. Negative results do not preclude SARS-CoV-2 infection, do not rule out co-infections with other pathogens, and should not be used as the sole basis for treatment or other patient management decisions. Negative results must be combined with clinical observations, patient history, and epidemiological information. The expected result is Negative.  Fact Sheet for Patients: SugarRoll.be  Fact Sheet for  Healthcare Providers: https://www.woods-mathews.com/  This test is not yet approved or cleared by the [place] FDA and  has been authorized for detection and/or diagnosis of SARS-CoV-2 by FDA under an Emergency Use Authorization (EUA). This EUA will remain  in effect (meaning this test can be used) for the duration of the COVID-19 declaration under Se ction 564(b)(1) of the Act, 21 U.S.C. section 360bbb-3(b)(1), unless the authorization is terminated or revoked sooner.  Performed at [facility], [place] [address], [place], [place] [address]          Radiology Studies: CT Abdomen Pelvis W Contrast  Result Date: 09/04/2020 CLINICAL DATA:  A 87 year old female with history of abdominal trauma by report., fall from standing. EXAM: CT ABDOMEN AND PELVIS WITH CONTRAST TECHNIQUE: Multidetector CT imaging of the abdomen and pelvis was performed using the standard protocol following bolus administration of intravenous contrast. CONTRAST:  80 mL Omnipaque 300 COMPARISON:  July 08, 2019 FINDINGS: Lower chest: Trace amount of pleural fluid in the RIGHT chest. Heart size is enlarged with signs of coronary artery disease, incompletely imaged. Hepatobiliary: No focal, suspicious hepatic lesion. No pericholecystic stranding. No biliary duct dilation. Portal vein is patent. Stable partially calcified lesion in the RIGHT hemi liver measuring approximately 2.1 cm Pancreas: Normal, without mass, inflammation or ductal dilatation. Spleen: Normal Adrenals/Urinary Tract: Adrenal glands normal. Symmetric renal enhancement. No hydronephrosis. Urinary bladder with smooth contour. Renal cortical scarring on the LEFT is moderate and unchanged. Stomach/Bowel: Small hiatal hernia. No acute gastrointestinal process. Metallic clip in the rectum perhaps hemostatic clip or clip from polypectomy. No surrounding stranding. Vascular/Lymphatic: Calcified noncalcified atheromatous plaque of the abdominal  aorta. There is no gastrohepatic or hepatoduodenal ligament lymphadenopathy. No retroperitoneal or mesenteric lymphadenopathy. No pelvic sidewall lymphadenopathy. Reproductive: Post hysterectomy Other: No ascites.  No free air. Musculoskeletal: Osteopenia. Compression fracture at the L1 level is new with approximately 10% loss of height. L2 and L3 compression deformities with similar appearance compared to previous imaging, see dedicated lumbar spine evaluation for further detail. IMPRESSION: 1. Positive for acute compression fracture at the L1 level. Approximately 10%-20% loss of height. See dedicated lumbar spine evaluation for further detail. 2. Redemonstration of compression fractures at L2 and L3. 3. No acute intra-abdominal findings. 4. Signs of hemostatic clip placed in the rectum during previous colonoscopy. 5. Trace amount of pleural fluid in the RIGHT chest. 6. Stable calcified lesion in the RIGHT hepatic lobe, unchanged since December of 2020 and favored to represent a benign lesion such is atypical sclerosed hemangioma based on appearance on the prior study. 7. Aortic atherosclerosis. Aortic Atherosclerosis (ICD10-I70.0). Electronically Signed  By: Zetta Bills M.D.   On: 09/04/2020 15:58   CT L-SPINE NO CHARGE  Result Date: 09/04/2020 CLINICAL DATA:  Status post fall.  Low back pain. EXAM: CT LUMBAR SPINE WITHOUT CONTRAST TECHNIQUE: Multidetector CT imaging of the lumbar spine was performed without intravenous contrast administration. Multiplanar CT image reconstructions were also generated. COMPARISON:  07/08/2019 FINDINGS: Segmentation: 5 lumbar type vertebrae. Alignment: Minimal grade 1 anterolisthesis of L3 on L4 secondary to facet disease. Vertebrae: Acute minimal compression fracture of the L1 vertebral body without significant height loss. Chronic L2 vertebral body compression fracture with approximately 40% height loss. Chronic L3 vertebral body compression fracture with approximately 30%  height loss. No discitis or osteomyelitis. Paraspinal and other soft tissues: No acute paraspinal abnormality. Abdominal aortic atherosclerosis. Trace right pleural effusion. Disc levels: Degenerative disease with disc height loss at L4-5 and L5-S1. Bilateral facet arthropathy throughout the lumbar spine. Bilateral foraminal narrowing at L4-5. Moderate spinal stenosis at L3-4. Mild spinal stenosis at L4-5. IMPRESSION: 1. Acute minimal compression fracture of the L1 vertebral body without significant height loss. 2. Chronic L2 and L3 vertebral body compression fractures. 3. Aortic Atherosclerosis (ICD10-I70.0). Electronically Signed   By: Kathreen Devoid   On: 09/04/2020 16:12   DG Hip Unilat W or Wo Pelvis 2-3 Views Left  Result Date: 09/04/2020 CLINICAL DATA:  Left hip pain after fall. EXAM: DG HIP (WITH OR WITHOUT PELVIS) 2-3V LEFT COMPARISON:  None. FINDINGS: There is no evidence of hip fracture or dislocation. Moderate osteophyte formation is seen involving the left hip. 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 No acute abnormality seen. Electronically Signed   By: Marijo Conception M.D.   On: 09/04/2020 14:55   DG HIP UNILAT WITH PELVIS 2-3 VIEWS RIGHT  Result Date: 09/05/2020 CLINICAL DATA:  Right hip pain without known injury. EXAM: DG HIP (WITH OR WITHOUT PELVIS) 2-3V RIGHT COMPARISON:  None. FINDINGS: There is no evidence of hip fracture or dislocation. Mild osteophyte formation is seen involving the right hip. IMPRESSION: Mild degenerative joint disease of the right hip. Electronically Signed   By: Marijo Conception M.D.   On: 09/05/2020 14:11        Scheduled Meds: . amLODipine  5 mg Oral Daily  . atenolol  100 mg Oral Daily  . hydrocortisone  25 mg Rectal BID  . lidocaine  1 patch Transdermal Q24H  . losartan  100 mg Oral QPM  . polycarbophil  625 mg Oral Daily  . polyethylene glycol  17 g Oral Daily  . simvastatin  20 mg Oral QPM   Continuous Infusions:   LOS: 0  days    Time spent: 30 minutes    Barb Merino, MD [facility] Pager [phone]

## 2020-09-05 NOTE — Evaluation (Signed)
Physical Therapy Evaluation Patient Details Name: Christine Middleton MRN: [id number] DOB: [birth year] Today's Date: 09/05/2020   History of Present Illness  87 y.o. female with medical history significant of previous CVA, HTN, HLD, GERD who presented 09/04/20 with  Fall and back pain. CT showed Acute minimal compression fracture of the L1 vertebral body; Chronic L2 and L3 vertebral body compression fractures  Clinical Impression   Pt admitted with above diagnosis. Patient normally highly independent and does not use an assistive device. She currently was limited by severe pain (8/10) and inability to tolerate ambulation (she did stand x 90 seconds and took shuffle, small steps sideways toward [facility]). She will benefit from better pain control and education in use of RW.  Pt currently with functional limitations due to the deficits listed below (see PT Problem List). Pt will benefit from skilled PT to increase their independence and safety with mobility to allow discharge to the venue listed below.       Follow Up Recommendations Home health PT    Equipment Recommendations       Recommendations for Other Services       Precautions / Restrictions Precautions Precautions: Fall;Back Precaution Booklet Issued: No Precaution Comments: Pt instructed in log rolling Required Braces or Orthoses: Spinal Brace Spinal Brace: Lumbar corset      Mobility  Bed Mobility Overal bed mobility: Needs Assistance Bed Mobility: Rolling;Sidelying to Sit;Sit to Sidelying Rolling: Min assist Sidelying to sit: Min assist     Sit to sidelying: Max assist General bed mobility comments: Pt instructed in log roll technique.  Assist to guide LEs with log rolling, and min A to lift trunk.  Assist to lower trunk and lift LEs onto stretcher    Transfers Overall transfer level: Needs assistance Equipment used: 2 person hand held assist Transfers: Sit to/from Stand Sit to Stand: Min assist;+2 physical assistance;+2  safety/equipment         General transfer comment: Pt required assist to boost into standing and assist to side step up the EOB.  Pt reports pain too intense to attempt ambulation with RW  Ambulation/Gait                Stairs            Wheelchair Mobility    Modified Rankin (Stroke Patients Only)       Balance Overall balance assessment: Needs assistance Sitting-balance support: Single extremity supported Sitting balance-Leahy Scale: Poor Sitting balance - Comments: requires UE support to brace self due to pain   Standing balance support: Bilateral upper extremity supported Standing balance-Leahy Scale: Poor Standing balance comment: requires UE support                             Pertinent Vitals/Pain Pain Assessment: Faces Faces Pain Scale: Hurts whole lot Pain Location: back Pain Descriptors / Indicators: Aching Pain Intervention(s): Limited activity within patient's tolerance;Repositioned    Home Living Family/patient expects to be discharged to:: Private residence Living Arrangements: Other relatives Available Help at Discharge: Family;Available 24 hours/day Type of Home: House Home Access: Ramped entrance     Home Layout: Multi-level;Able to live on main level with bedroom/bathroom Home Equipment: Grab bars - toilet;Shower seat;Walker - 2 wheels;Walker - 4 wheels Additional Comments: lives with brother, who has polio.  Her son and daughter will provide 24 hour assist    Prior Function Level of Independence: Independent  Comments: Pt was fully independent with no AD PTA.     Hand Dominance   Dominant Hand: Right    Extremity/Trunk Assessment   Upper Extremity Assessment Upper Extremity Assessment: Defer to OT evaluation    Lower Extremity Assessment Lower Extremity Assessment: Overall WFL for tasks assessed    Cervical / Trunk Assessment Cervical / Trunk Assessment: Other exceptions Cervical / Trunk  Exceptions: s/p compression fracture  Communication   Communication: HOH  Cognition Arousal/Alertness: Awake/alert Behavior During Therapy: WFL for tasks assessed/performed Overall Cognitive Status: Within Functional Limits for tasks assessed                                        General Comments General comments (skin integrity, edema, etc.): Sp02 82-93% on RA with good pleth, but pt with fingernail polish on.  DOE 0/4 - MD aware.  Orthostatic BPs monitored but stable with no drop.    Exercises     Assessment/Plan    PT Assessment Patient needs continued PT services  PT Problem List Decreased range of motion;Decreased activity tolerance;Decreased balance;Decreased mobility;Decreased knowledge of use of DME;Decreased knowledge of precautions;Pain       PT Treatment Interventions DME instruction;Gait training;Functional mobility training;Therapeutic activities;Patient/family education    PT Goals (Current goals can be found in the Care Plan section)  Acute Rehab PT Goals Patient Stated Goal: to go home and have less pain PT Goal Formulation: With patient/family Time For Goal Achievement: 09/19/20 Potential to Achieve Goals: Good    Frequency Min 5X/week   Barriers to discharge        Co-evaluation               AM-PAC PT "6 Clicks" Mobility  Outcome Measure Help needed turning from your back to your side while in a flat bed without using bedrails?: A Little Help needed moving from lying on your back to sitting on the side of a flat bed without using bedrails?: A Little Help needed moving to and from a bed to a chair (including a wheelchair)?: A Little Help needed standing up from a chair using your arms (e.g., wheelchair or bedside chair)?: Total Help needed to walk in hospital room?: Total Help needed climbing 3-5 steps with a railing? : Total 6 Click Score: 12    End of Session Equipment Utilized During Treatment: Back brace Activity  Tolerance: Patient limited by pain Patient left: in bed;with call bell/phone within reach;with family/visitor present Nurse Communication: Mobility status;Other (comment) (MD plans to allow pt to stay overnight) PT Visit Diagnosis: Pain;Difficulty in walking, not elsewhere classified (R26.2) Pain - Right/Left:  (midline) Pain - part of body:  (back)    Time: 6045-4098 PT Time Calculation (min) (ACUTE ONLY): 34 min   Charges:   PT Evaluation $PT Eval Moderate Complexity: 1 Mod           Arby Barrette, PT Pager [phone]   Rexanne Mano 09/05/2020, 5:07 PM

## 2020-09-05 NOTE — Progress Notes (Signed)
Orthopedic Tech Progress Note Patient Details:  Christine Middleton [birth year] [id number]  Patient ID: Christine Middleton, female   DOB: 10-Dec-1922, 87 y.o.   MRN: [id number] Called order into hanger Karolee Stamps 09/05/2020, 6:30 AM

## 2020-09-05 NOTE — ED Notes (Signed)
Daughter at bedside and was updated.

## 2020-09-05 NOTE — ED Notes (Signed)
Tele  Breakfast Ordered 

## 2020-09-06 DIAGNOSIS — S32010A Wedge compression fracture of first lumbar vertebra, initial encounter for closed fracture: Secondary | ICD-10-CM | POA: Diagnosis not present

## 2020-09-06 DIAGNOSIS — S32019A Unspecified fracture of first lumbar vertebra, initial encounter for closed fracture: Secondary | ICD-10-CM | POA: Diagnosis not present

## 2020-09-06 LAB — BASIC METABOLIC PANEL
Anion gap: 9 (ref 5–15)
BUN: 21 mg/dL (ref 8–23)
CO2: 24 mmol/L (ref 22–32)
Calcium: 8.7 mg/dL — ABNORMAL LOW (ref 8.9–10.3)
Chloride: 101 mmol/L (ref 98–111)
Creatinine, Ser: 1.05 mg/dL — ABNORMAL HIGH (ref 0.44–1.00)
GFR, Estimated: 49 mL/min — ABNORMAL LOW (ref >60)
Glucose, Bld: 132 mg/dL — ABNORMAL HIGH (ref 70–99)
Potassium: 3.8 mmol/L (ref 3.5–5.1)
Sodium: 134 mmol/L — ABNORMAL LOW (ref 135–145)

## 2020-09-06 LAB — CBC WITH DIFFERENTIAL/PLATELET
Abs Immature Granulocytes: 0.07 10*3/uL (ref 0.00–0.07)
Basophils Absolute: 0 10*3/uL (ref 0.0–0.1)
Basophils Relative: 0 %
Eosinophils Absolute: 0 10*3/uL (ref 0.0–0.5)
Eosinophils Relative: 0 %
HCT: 26.7 % — ABNORMAL LOW (ref 36.0–46.0)
Hemoglobin: 9 g/dL — ABNORMAL LOW (ref 12.0–15.0)
Immature Granulocytes: 1 %
Lymphocytes Relative: 6 %
Lymphs Abs: 0.8 10*3/uL (ref 0.7–4.0)
MCH: 30.1 pg (ref 26.0–34.0)
MCHC: 33.7 g/dL (ref 30.0–36.0)
MCV: 89.3 fL (ref 80.0–100.0)
Monocytes Absolute: 1.3 10*3/uL — ABNORMAL HIGH (ref 0.1–1.0)
Monocytes Relative: 10 %
Neutro Abs: 11 10*3/uL — ABNORMAL HIGH (ref 1.7–7.7)
Neutrophils Relative %: 83 %
Platelets: 229 10*3/uL (ref 150–400)
RBC: 2.99 MIL/uL — ABNORMAL LOW (ref 3.87–5.11)
RDW: 13.1 % (ref 11.5–15.5)
WBC: 13.2 10*3/uL — ABNORMAL HIGH (ref 4.0–10.5)
nRBC: 0 % (ref 0.0–0.2)

## 2020-09-06 LAB — URINE CULTURE

## 2020-09-06 MED ORDER — LIDOCAINE 5 % EX PTCH: 1.0000 | MEDICATED_PATCH | CUTANEOUS | 0 refills | Status: AC

## 2020-09-06 MED ORDER — TRAMADOL HCL 50 MG PO TABS: 50.0000 mg | ORAL_TABLET | Freq: Three times a day (TID) | ORAL | 0 refills | Status: AC | PRN

## 2020-09-06 NOTE — Progress Notes (Signed)
° °  Providing Compassionate, Quality Care - Together  NEUROSURGERY PROGRESS NOTE   S: No issues overnight. Pain with PT, but tolerable, eating breakfast right now  O: EXAM:  BP (!) 106/49 (BP Location: Right Arm)    Pulse 62    Temp 97.8 F (36.6 C) (Oral)    Resp 18    Ht 5\' 2"  (1.575 m)    Wt 63.8 kg    SpO2 92%    BMI 25.73 kg/m   Awake, alert, oriented  Speech fluent, appropriate  CNs grossly intact  5/5 BUE/BLE  TTP L spine  ASSESSMENT:  87 y.o. female with  1. L1 acute compression fracture, chronic L2, L3 compression fractures  Plan:  -Patient tolerated PT with brace, can follow-up as an outpatient.  Okay for discharge from my standpoint -PT/OT with brace -Pain control   Thank you for allowing me to participate in this patient's care.  Please do not hesitate to call with questions or concerns.   Elwin Sleight, [facility] Cell: [phone]

## 2020-09-06 NOTE — Discharge Summary (Signed)
Physician Discharge Summary  Boni Downie Waterworth [id number] DOB: Oct 07, 1922 DOA: 09/04/2020  PCP: Laurey Morale, MD  Admit date: 09/04/2020 Discharge date: 09/06/2020  Admitted From: Home Disposition: Home with home health  Recommendations for Outpatient Follow-up:  1. Follow up with PCP in 1-2 weeks 2. Recheck hemoglobin in 1 week. 3. Follow-up with neurosurgery, they will schedule follow-up in 3 weeks.  Home Health: PT/OT Equipment/Devices: Gilford Rile, present at home  Discharge Condition: Stable CODE STATUS: DNR Diet recommendation: Low-salt diet  Discharge summary: Patient is 87 year old female who is very functional and lives at home, has history of paroxysmal A. fib on Eliquis and atenolol, hypertension and hyperlipidemia, history of hemorrhoids with recent frequent rectal bleeding with hard stool presented to the ER with mechanical fall at home, was trying to reach to refrigerator and landed on her buttock.  Complains of low back pain and pelvic pain. In the emergency room, hemodynamically stable.  Skeletal survey showed minimal compressed L1 compression fracture.  Old L2 and L3 compression fractures.  Was having ongoing pain and difficult to ambulate so she was monitored in the hospital.  Also seen by neurosurgery.  # Acute traumatic L1 vertebral fracture.  Osteoporotic compression fracture: CT scan with evidence of minimal compression.  Patient with no neurological deficit. conservative management. Continue Tylenol, tramadol and lidocaine patch.   She did work with PT OT.  She has good support system at home.  She will go home and will prescribe home health PT OT. Seen by neurosurgery, they recommended LSO bracing on mobility.  # Chronic intermittent rectal bleed: Colonoscopy 2 years ago with evidence of internal hemorrhoids and diverticulosis and rectosigmoid stricture.  No indication to repeat colonoscopy. Baseline hemoglobin about 11.  Hemoglobin 9 today. Using steroid suppository  that we will continue. Patient is on Eliquis therapy, may be exacerbating her rectal bleed.  However benefits still outweigh risk of outlet bleeding.  We will continue. Avoid constipation.  She will use MiraLAX daily and may go up to 2 times a day as needed to regulate her bowel movements.  # Paroxysmal A. fib on Eliquis: Rate controlled.  Sinus rhythm. Difficult decision to continue Eliquis in the context of ongoing mild intermittent rectal bleeding. She has history of multiple strokes. Discontinuation of Eliquis can precipitate a stroke. Risk versus benefit discussion done with patient and family. At this time, patient does not have ongoing active bleeding.  Will treat symptomatically with laxatives and stool softener, steroid suppository and continue Eliquis. If she continues to bleed in the context of taking Eliquis, then we need to consider discontinuation of therapy.  This was discussed with patient and her daughter.  If ongoing bleeding, they will talk to her cardiologist.  # Hypertension: Blood pressure stable.  Resume home medications.  # Hyperlipidemia: On statin.  Patient is stable for discharge home today.    Discharge Diagnoses:  Principal Problem:   Closed compression fracture of body of L1 vertebra (HCC) Active Problems:   HLD (hyperlipidemia)   Essential hypertension   External hemorrhoids   Diverticulosis of colon   Cerebrovascular accident (CVA) ([place])   Hypertension   Rectal bleeding   Fall at home, initial encounter   Acute on chronic blood loss anemia    Discharge Instructions  Discharge Instructions    Call MD for:  severe uncontrolled pain   Complete by: As directed    Diet - low sodium heart healthy   Complete by: As directed    Discharge instructions  Complete by: As directed    Use additional laxative to make smooth bowel movement . You can take miralax two times a day if not getting good results.   Increase activity slowly   Complete by: As  directed      Allergies as of 09/06/2020      Reactions   Amoxicillin    REACTION: rash   Codeine    Lipitor [atorvastatin]    Muscle pain, stiff joints   Lisinopril    Nitrofurantoin       Medication List    TAKE these medications   acetaminophen 325 MG tablet Commonly known as: TYLENOL Take 325 mg by mouth every 6 (six) hours as needed for mild pain or headache.   amLODipine 5 MG tablet Commonly known as: NORVASC TAKE 1 TABLET BY MOUTH EVERY DAY   atenolol 50 MG tablet Commonly known as: TENORMIN TAKE 2 TABLETS BY MOUTH EVERY DAY   Eliquis 2.5 MG Tabs tablet Generic drug: apixaban TAKE 1 TABLET BY MOUTH TWICE A DAY   hydrocortisone 25 MG suppository Commonly known as: ANUSOL-HC Place 1 suppository (25 mg total) rectally 2 (two) times daily.   lidocaine 5 % Commonly known as: LIDODERM Place 1 patch onto the skin daily for 15 doses. Remove & Discard patch within 12 hours and use one next day Start taking on: September 07, 2020   losartan 100 MG tablet Commonly known as: COZAAR TAKE 1 TABLET BY MOUTH EVERY DAY What changed: when to take this   pantoprazole 40 MG tablet Commonly known as: PROTONIX Take 1 tablet (40 mg total) by mouth daily.   polycarbophil 625 MG tablet Commonly known as: FIBERCON Take 625 mg by mouth daily.   polyethylene glycol 17 g packet Commonly known as: MIRALAX / GLYCOLAX Take 17 g by mouth daily.   simvastatin 20 MG tablet Commonly known as: ZOCOR TAKE 1 TABLET BY MOUTH EVERYDAY AT BEDTIME What changed: See the new instructions.   traMADol 50 MG tablet Commonly known as: ULTRAM Take 1 tablet (50 mg total) by mouth every 8 (eight) hours as needed for up to 5 days for moderate pain or severe pain.       Follow-up Information    Laurey Morale, MD Follow up in 2 week(s).   Specialty: Family Medicine Contact information: [place] [address] [phone]        Dawley, Theodoro Doing, DO Follow up in 3  week(s).   Contact information: [address] [place] [address] [place] [address] [phone]              Allergies  Allergen Reactions  . Amoxicillin     REACTION: rash  . Codeine   . Lipitor [Atorvastatin]     Muscle pain, stiff joints  . Lisinopril   . Nitrofurantoin     Consultations:  Neurosurgery   Procedures/Studies: CT Abdomen Pelvis W Contrast  Result Date: 09/04/2020 CLINICAL DATA:  A 87 year old female with history of abdominal trauma by report., fall from standing. EXAM: CT ABDOMEN AND PELVIS WITH CONTRAST TECHNIQUE: Multidetector CT imaging of the abdomen and pelvis was performed using the standard protocol following bolus administration of intravenous contrast. CONTRAST:  80 mL Omnipaque 300 COMPARISON:  July 08, 2019 FINDINGS: Lower chest: Trace amount of pleural fluid in the RIGHT chest. Heart size is enlarged with signs of coronary artery disease, incompletely imaged. Hepatobiliary: No focal, suspicious hepatic lesion. No pericholecystic stranding. No biliary duct dilation. Portal vein  is patent. Stable partially calcified lesion in the RIGHT hemi liver measuring approximately 2.1 cm Pancreas: Normal, without mass, inflammation or ductal dilatation. Spleen: Normal Adrenals/Urinary Tract: Adrenal glands normal. Symmetric renal enhancement. No hydronephrosis. Urinary bladder with smooth contour. Renal cortical scarring on the LEFT is moderate and unchanged. Stomach/Bowel: Small hiatal hernia. No acute gastrointestinal process. Metallic clip in the rectum perhaps hemostatic clip or clip from polypectomy. No surrounding stranding. Vascular/Lymphatic: Calcified noncalcified atheromatous plaque of the abdominal aorta. There is no gastrohepatic or hepatoduodenal ligament lymphadenopathy. No retroperitoneal or mesenteric lymphadenopathy. No pelvic sidewall lymphadenopathy. Reproductive: Post hysterectomy Other: No ascites.  No free air. Musculoskeletal: Osteopenia.  Compression fracture at the L1 level is new with approximately 10% loss of height. L2 and L3 compression deformities with similar appearance compared to previous imaging, see dedicated lumbar spine evaluation for further detail. IMPRESSION: 1. Positive for acute compression fracture at the L1 level. Approximately 10%-20% loss of height. See dedicated lumbar spine evaluation for further detail. 2. Redemonstration of compression fractures at L2 and L3. 3. No acute intra-abdominal findings. 4. Signs of hemostatic clip placed in the rectum during previous colonoscopy. 5. Trace amount of pleural fluid in the RIGHT chest. 6. Stable calcified lesion in the RIGHT hepatic lobe, unchanged since December of 2020 and favored to represent a benign lesion such is atypical sclerosed hemangioma based on appearance on the prior study. 7. Aortic atherosclerosis. Aortic Atherosclerosis (ICD10-I70.0). Electronically Signed   By: Zetta Bills M.D.   On: 09/04/2020 15:58   CT L-SPINE NO CHARGE  Result Date: 09/04/2020 CLINICAL DATA:  Status post fall.  Low back pain. EXAM: CT LUMBAR SPINE WITHOUT CONTRAST TECHNIQUE: Multidetector CT imaging of the lumbar spine was performed without intravenous contrast administration. Multiplanar CT image reconstructions were also generated. COMPARISON:  07/08/2019 FINDINGS: Segmentation: 5 lumbar type vertebrae. Alignment: Minimal grade 1 anterolisthesis of L3 on L4 secondary to facet disease. Vertebrae: Acute minimal compression fracture of the L1 vertebral body without significant height loss. Chronic L2 vertebral body compression fracture with approximately 40% height loss. Chronic L3 vertebral body compression fracture with approximately 30% height loss. No discitis or osteomyelitis. Paraspinal and other soft tissues: No acute paraspinal abnormality. Abdominal aortic atherosclerosis. Trace right pleural effusion. Disc levels: Degenerative disease with disc height loss at L4-5 and L5-S1.  Bilateral facet arthropathy throughout the lumbar spine. Bilateral foraminal narrowing at L4-5. Moderate spinal stenosis at L3-4. Mild spinal stenosis at L4-5. IMPRESSION: 1. Acute minimal compression fracture of the L1 vertebral body without significant height loss. 2. Chronic L2 and L3 vertebral body compression fractures. 3. Aortic Atherosclerosis (ICD10-I70.0). Electronically Signed   By: Kathreen Devoid   On: 09/04/2020 16:12   DG Hip Unilat W or Wo Pelvis 2-3 Views Left  Result Date: 09/04/2020 CLINICAL DATA:  Left hip pain after fall. EXAM: DG HIP (WITH OR WITHOUT PELVIS) 2-3V LEFT COMPARISON:  None. FINDINGS: There is no evidence of hip fracture or dislocation. Moderate osteophyte formation is seen involving the left hip. 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 No acute abnormality seen. Electronically Signed   By: Marijo Conception M.D.   On: 09/04/2020 14:55   DG HIP UNILAT WITH PELVIS 2-3 VIEWS RIGHT  Result Date: 09/05/2020 CLINICAL DATA:  Right hip pain without known injury. EXAM: DG HIP (WITH OR WITHOUT PELVIS) 2-3V RIGHT COMPARISON:  None. FINDINGS: There is no evidence of hip fracture or dislocation. Mild osteophyte formation is seen involving the right hip. IMPRESSION: Mild degenerative  joint disease of the right hip. Electronically Signed   By: Marijo Conception M.D.   On: 09/05/2020 14:11    (Echo, Carotid, EGD, Colonoscopy, ERCP)    Subjective: Patient was seen and examined.  Her daughter was at the bedside.  Tramadol did well for pain relief while at rest. She was working with therapy today, she was at least able to walk from bed to couch today with the use of walker.  Denies any urinary retention.  No bowel movement since coming to the hospital.  No evidence of bleeding. Eager to go home.  Has adequate support system, facilities available at home.   Discharge Exam: Vitals:   09/06/20 0601 09/06/20 1030  BP: (!) 135/58 (!) 106/49  Pulse: 81 62  Resp: 18 18   Temp: 97.6 F (36.4 C) 97.8 F (36.6 C)  SpO2: 94% 92%   Vitals:   09/06/20 0500 09/06/20 0601 09/06/20 0720 09/06/20 1030  BP: (!) 120/54 (!) 135/58  (!) 106/49  Pulse:  81  62  Resp: 18 18  18   Temp:  97.6 F (36.4 C)  97.8 F (36.6 C)  TempSrc:  Oral  Oral  SpO2:  94%  92%  Weight:   63.8 kg   Height:   5\' 2"  (1.575 m)     General: Pt is alert, awake, not in acute distress Cardiovascular: RRR, S1/S2 +, no rubs, no gallops Respiratory: CTA bilaterally, no wheezing, no rhonchi Abdominal: Soft, NT, ND, bowel sounds + Extremities: no edema, no cyanosis Patient has some tenderness along the mid back but no deformity. No neuro deficit. Mild tenderness along the right hip, ROM intact, no visible /palpable swelling, hematoma     The results of significant diagnostics from this hospitalization (including imaging, microbiology, ancillary and laboratory) are listed below for reference.     Microbiology: Recent Results (from the past 240 hour(s))  SARS CORONAVIRUS 2 (TAT 6-24 HRS) Nasopharyngeal Nasopharyngeal Swab     Status: None   Collection Time: 09/04/20  5:57 PM   Specimen: Nasopharyngeal Swab  Result Value Ref Range Status   SARS Coronavirus 2 NEGATIVE NEGATIVE Final    Comment: (NOTE) SARS-CoV-2 target nucleic acids are NOT DETECTED.  The SARS-CoV-2 RNA is generally detectable in upper and lower respiratory specimens during the acute phase of infection. Negative results do not preclude SARS-CoV-2 infection, do not rule out co-infections with other pathogens, and should not be used as the sole basis for treatment or other patient management decisions. Negative results must be combined with clinical observations, patient history, and epidemiological information. The expected result is Negative.  Fact Sheet for Patients: SugarRoll.be  Fact Sheet for Healthcare Providers: https://www.woods-mathews.com/  This test is not yet  approved or cleared by the [place] FDA and  has been authorized for detection and/or diagnosis of SARS-CoV-2 by FDA under an Emergency Use Authorization (EUA). This EUA will remain  in effect (meaning this test can be used) for the duration of the COVID-19 declaration under Se ction 564(b)(1) of the Act, 21 U.S.C. section 360bbb-3(b)(1), unless the authorization is terminated or revoked sooner.  Performed at [facility], [place] [address], [place], [place] [address]      Labs: BNP (last 3 results) No results for input(s): BNP in the last 8760 hours. Basic Metabolic Panel: Recent Labs  Lab 09/04/20 1421 09/04/20 1429 09/06/20 0233  NA 138 138 134*  K 4.0 3.9 3.8  CL 104 106 101  CO2 22  --  24  GLUCOSE 148* 144* 132*  BUN 22 23 21   CREATININE 1.24* 1.20* 1.05*  CALCIUM 8.5*  --  8.7*   Liver Function Tests: Recent Labs  Lab 09/04/20 1421  AST 23  ALT 21  ALKPHOS 79  BILITOT 1.2  PROT 6.1*  ALBUMIN 3.2*   No results for input(s): LIPASE, AMYLASE in the last 168 hours. No results for input(s): AMMONIA in the last 168 hours. CBC: Recent Labs  Lab 09/04/20 1421 09/04/20 1429 09/04/20 2114 09/05/20 0414 09/06/20 0233  WBC 14.2*  --   --   --  13.2*  NEUTROABS 12.0*  --   --   --  11.0*  HGB 9.0* 8.8* 9.3* 9.5* 9.0*  HCT 29.3* 26.0* 28.5* 28.9* 26.7*  MCV 92.4  --   --   --  89.3  PLT 236  --   --   --  229   Cardiac Enzymes: No results for input(s): CKTOTAL, CKMB, CKMBINDEX, TROPONINI in the last 168 hours. BNP: Invalid input(s): POCBNP CBG: No results for input(s): GLUCAP in the last 168 hours. D-Dimer No results for input(s): DDIMER in the last 72 hours. Hgb A1c No results for input(s): HGBA1C in the last 72 hours. Lipid Profile No results for input(s): CHOL, HDL, LDLCALC, TRIG, CHOLHDL, LDLDIRECT in the last 72 hours. Thyroid function studies No results for input(s): TSH, T4TOTAL, T3FREE, THYROIDAB in the last 72 hours.  Invalid  input(s): FREET3 Anemia work up No results for input(s): VITAMINB12, FOLATE, FERRITIN, TIBC, IRON, RETICCTPCT in the last 72 hours. Urinalysis    Component Value Date/Time   COLORURINE YELLOW 09/05/2020 1331   APPEARANCEUR CLEAR 09/05/2020 1331   LABSPEC 1.041 (H) 09/05/2020 1331   PHURINE 5.0 09/05/2020 1331   GLUCOSEU NEGATIVE 09/05/2020 1331   HGBUR NEGATIVE 09/05/2020 1331   HGBUR negative 08/31/2009 0927   BILIRUBINUR NEGATIVE 09/05/2020 1331   BILIRUBINUR neg 10/02/2018 1058   KETONESUR 20 (A) 09/05/2020 1331   PROTEINUR NEGATIVE 09/05/2020 1331   UROBILINOGEN 0.2 10/02/2018 1058   UROBILINOGEN 0.2 10/04/2013 2030   NITRITE NEGATIVE 09/05/2020 1331   LEUKOCYTESUR NEGATIVE 09/05/2020 1331   Sepsis Labs Invalid input(s): PROCALCITONIN,  WBC,  LACTICIDVEN Microbiology Recent Results (from the past 240 hour(s))  SARS CORONAVIRUS 2 (TAT 6-24 HRS) Nasopharyngeal Nasopharyngeal Swab     Status: None   Collection Time: 09/04/20  5:57 PM   Specimen: Nasopharyngeal Swab  Result Value Ref Range Status   SARS Coronavirus 2 NEGATIVE NEGATIVE Final    Comment: (NOTE) SARS-CoV-2 target nucleic acids are NOT DETECTED.  The SARS-CoV-2 RNA is generally detectable in upper and lower respiratory specimens during the acute phase of infection. Negative results do not preclude SARS-CoV-2 infection, do not rule out co-infections with other pathogens, and should not be used as the sole basis for treatment or other patient management decisions. Negative results must be combined with clinical observations, patient history, and epidemiological information. The expected result is Negative.  Fact Sheet for Patients: SugarRoll.be  Fact Sheet for Healthcare Providers: https://www.woods-mathews.com/  This test is not yet approved or cleared by the [place] FDA and  has been authorized for detection and/or diagnosis of SARS-CoV-2 by FDA under an  Emergency Use Authorization (EUA). This EUA will remain  in effect (meaning this test can be used) for the duration of the COVID-19 declaration under Se ction 564(b)(1) of the Act, 21 U.S.C. section 360bbb-3(b)(1), unless the authorization is terminated or revoked sooner.  Performed at [facility], [address], [place], [place] [address]      Time coordinating discharge:  32 minutes  SIGNED:   Barb Merino, MD  [facility] 09/06/2020, 11:17 AM

## 2020-09-06 NOTE — ED Notes (Signed)
Report given to Linda RN.

## 2020-09-06 NOTE — Progress Notes (Signed)
Physical Therapy Treatment Patient Details Name: Christine Middleton MRN: [id number] DOB: 26-Apr-1923 Today's Date: 09/06/2020    History of Present Illness 87 y.o. female with medical history significant of previous CVA, HTN, HLD, GERD who presented 09/04/20 with  Fall and back pain. CT showed Acute minimal compression fracture of the L1 vertebral body; Chronic L2 and L3 vertebral body compression fractures    PT Comments    Patient overall moving better, although still reporting 8/10 pain in back despite pre-medication for pain. Daughter present and agrees she (with assist of other family members) can manage pt at home (see below for details). She would like to observe OT session to learn how to further protect pt's back and maximize her independence. OT notified via secure chat of pending discharge.    Follow Up Recommendations  Home health PT     Equipment Recommendations       Recommendations for Other Services       Precautions / Restrictions Precautions Precautions: Fall;Back Precaution Booklet Issued: No Precaution Comments: Pt instructed in log rolling Required Braces or Orthoses: Spinal Brace Spinal Brace: Lumbar corset Restrictions Weight Bearing Restrictions: No    Mobility  Bed Mobility Overal bed mobility: Needs Assistance Bed Mobility: Rolling;Sidelying to Sit;Sit to Sidelying Rolling: Min guard Sidelying to sit: Min guard       General bed mobility comments: Pt cued for log roll technique.  No physical assist needed with HOB flat and no rail  Transfers Overall transfer level: Needs assistance Equipment used: Rolling walker (2 wheeled) Transfers: Sit to/from Stand Sit to Stand: Min assist         General transfer comment: Pt required assist to boost into standing  Ambulation/Gait Ambulation/Gait assistance: Min guard Gait Distance (Feet): 20 Feet Assistive device: Rolling walker (2 wheeled) Gait Pattern/deviations: Step-through pattern;Decreased stride  length     General Gait Details: vc for proximity to RW and initial assist with turning/sliding RW (pt able to do without assist by end of session)   Stairs             Wheelchair Mobility    Modified Rankin (Stroke Patients Only)       Balance Overall balance assessment: Needs assistance Sitting-balance support: No upper extremity supported;Feet supported Sitting balance-Leahy Scale: Fair     Standing balance support: Bilateral upper extremity supported Standing balance-Leahy Scale: Poor Standing balance comment: requires UE support                            Cognition Arousal/Alertness: Awake/alert Behavior During Therapy: Flat affect Overall Cognitive Status: Within Functional Limits for tasks assessed                                        Exercises      General Comments General comments (skin integrity, edema, etc.): Daughter present and discussed plan for getting into house (wheelchair with ramp). Encouraged as much walking as possible inside home and not to overuse wheelchair.      Pertinent Vitals/Pain Pain Assessment: 0-10 Pain Score: 8  Pain Location: back Pain Descriptors / Indicators: Aching Pain Intervention(s): Limited activity within patient's tolerance;Monitored during session;Premedicated before session    Home Living Family/patient expects to be discharged to:: Private residence Living Arrangements: Children  Prior Function            PT Goals (current goals can now be found in the care plan section) Acute Rehab PT Goals Patient Stated Goal: to go home and have less pain PT Goal Formulation: With patient/family Time For Goal Achievement: 09/19/20 Potential to Achieve Goals: Good Progress towards PT goals: Progressing toward goals    Frequency    Min 5X/week      PT Plan Current plan remains appropriate    Co-evaluation              AM-PAC PT "6 Clicks" Mobility    Outcome Measure  Help needed turning from your back to your side while in a flat bed without using bedrails?: A Little Help needed moving from lying on your back to sitting on the side of a flat bed without using bedrails?: A Little Help needed moving to and from a bed to a chair (including a wheelchair)?: A Little Help needed standing up from a chair using your arms (e.g., wheelchair or bedside chair)?: A Little Help needed to walk in hospital room?: A Little Help needed climbing 3-5 steps with a railing? : A Lot 6 Click Score: 17    End of Session Equipment Utilized During Treatment: Back brace Activity Tolerance: Patient limited by pain Patient left: with call bell/phone within reach;with family/visitor present;in chair;Other (comment) (MD, RN present) Nurse Communication: Mobility status PT Visit Diagnosis: Pain;Difficulty in walking, not elsewhere classified (R26.2) Pain - Right/Left:  (midline) Pain - part of body:  (back)     Time: 7893-8101 PT Time Calculation (min) (ACUTE ONLY): 30 min  Charges:  $Gait Training: 8-22 mins $Therapeutic Activity: 8-22 mins                      Arby Barrette, PT Pager [phone]    Christine Middleton 09/06/2020, 10:07 AM

## 2020-09-06 NOTE — TOC Initial Note (Addendum)
Transition of Care [facility]) - Initial/Assessment Note    Patient Details  Name: Christine Middleton MRN: [id number] Date of Birth: Oct 16, 1922  Transition of Care [facility]) CM/SW Contact:    Marilu Favre, RN Phone Number: 09/06/2020, 12:19 PM  Clinical Narrative:                 Damaris Schooner to patient and daughter [name] at bedside. Patient from home with her son and plans to return. Discussed with patient and daughter at bedside. Provided Medicare.gov list of home health agencies. Daughter has no preference but would like a 4 or 5 star rating.   [facility] accepted.   NCM calling agencies. Will update once I have an accepting agency.   Discussed tub bench. Patient already has at home.  Expected Discharge Plan: [place]     Patient Goals and CMS Choice Patient states their goals for this hospitalization and ongoing recovery are:: to return to home CMS Medicare.gov Compare Post Acute Care list provided to:: Patient Choice offered to / list presented to : [place]  Expected Discharge Plan and Services Expected Discharge Plan: [place]   Discharge Planning Services: CM Consult Post Acute Care Choice: [place] arrangements for the past 2 months: Single Family Home Expected Discharge Date: 09/06/20               DME Arranged: N/A DME Agency: NA       HH Arranged: PT,OT          Prior Living Arrangements/Services Living arrangements for the past 2 months: Single Family Home Lives with:: Adult Children Patient language and need for interpreter reviewed:: Yes Do you feel safe going back to the place where you live?: Yes      Need for Family Participation in Patient Care: Yes (Comment) Care giver support system in place?: Yes (comment)   Criminal Activity/Legal Involvement Pertinent to Current Situation/Hospitalization: No - Comment as needed  Activities of Daily Living Home Assistive Devices/Equipment: None ADL  Screening (condition at time of admission) Patient's cognitive ability adequate to safely complete daily activities?: No Is the patient deaf or have difficulty hearing?: Yes Does the patient have difficulty seeing, even when wearing glasses/contacts?: No Does the patient have difficulty concentrating, remembering, or making decisions?: No Patient able to express need for assistance with ADLs?: Yes Does the patient have difficulty dressing or bathing?: No Independently performs ADLs?: Yes (appropriate for developmental age) Does the patient have difficulty walking or climbing stairs?: No Weakness of Legs: None Weakness of Arms/Hands: None  Permission Sought/Granted   Permission granted to share information with : Yes, Verbal Permission Granted  Share Information with NAME: [name] daughter     Permission granted to share info w Relationship: daughter     Emotional Assessment Appearance:: Appears stated age Attitude/Demeanor/Rapport: Engaged Affect (typically observed): Accepting Orientation: : Oriented to Self,Oriented to Place,Oriented to  Time,Oriented to Situation Alcohol / Substance Use: Not Applicable Psych Involvement: No (comment)  Admission diagnosis:  Pain [R52] RUQ pain [R10.11] Lower GI bleed [K92.2] Fall [W19.XXXA] Fall, initial encounter [id number]] Anemia, unspecified type [D64.9] Closed compression fracture of body of L1 vertebra ([place]) [S32.010A] Compression fracture of L1 vertebra, initial encounter ([place]) [S32.010A] Patient Active Problem List   Diagnosis Date Noted  . Closed compression fracture of body of L1 vertebra ([place]) 09/04/2020  . Fall at home, initial encounter 09/04/2020  . Acute on chronic blood loss anemia 09/04/2020  . Rectal  bleeding 07/08/2019  . Depression with anxiety 09/26/2017  . Hypertension 01/17/2017  . Pedal edema 10/18/2016  . TMJ (dislocation of temporomandibular joint) 09/18/2016  . 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  . Cerebrovascular  accident (CVA) ([place])   . Embolic stroke ([place]) 35/70/1779  . Facial droop 08/18/2016  . Cerebral infarction ([place]) 10/04/2013  . HYPERGLYCEMIA 06/30/2010  . Cerebral artery occlusion with cerebral infarction ([place]) 07/19/2009  . Disorder resulting from impaired renal function 07/19/2009  . Polymyalgia rheumatica ([place]) 07/19/2009  . VITAMIN B12 DEFICIENCY 06/30/2009  . Anxiety state 06/28/2009  . Hereditary and idiopathic peripheral neuropathy 06/28/2009  . External hemorrhoids 12/03/2008  . ESOPHAGEAL STRICTURE 11/30/2008  . Diverticulosis of colon 11/30/2008  . GROSS HEMATURIA 11/04/2008  . MYALGIA 11/04/2008  . Constipation 06/24/2008  . ACUTE CYSTITIS 03/12/2008  . HLD (hyperlipidemia) 05/30/2007  . SKIN LESION 05/30/2007  . Essential hypertension 03/19/2007  . GERD 03/19/2007   PCP:  Laurey Morale, MD Pharmacy:   [id number] - [place], [place] [address] [place] [address] Phone: [phone] Fax: [phone]     Social Determinants of Health (SDOH) Interventions    Readmission Risk Interventions No flowsheet data found.

## 2020-09-06 NOTE — Progress Notes (Signed)
Occupational Therapy Treatment Patient Details Name: Christine Middleton MRN: [id number] DOB: [birth year] Today's Date: 09/06/2020    History of present illness 87 y.o. female with medical history significant of previous CVA, HTN, HLD, GERD who presented 09/04/20 with  Fall and back pain. CT showed Acute minimal compression fracture of the L1 vertebral body; Chronic L2 and L3 vertebral body compression fractures   OT comments  Focus of session on educating pt and daughter in back precautions related to ADL and IADL, compensatory strategies and IADL to avoid. Written handout provided to reinforce.  Follow Up Recommendations  Home health OT;Supervision/Assistance - 24 hour    Equipment Recommendations  Tub/shower bench    Recommendations for Other Services      Precautions / Restrictions Precautions Precautions: Fall;Back Precaution Booklet Issued: Yes (comment) Precaution Comments: educated pt and daughter in back precautions related to ADL and IADL Required Braces or Orthoses: Spinal Brace Spinal Brace: Lumbar corset       Mobility Bed Mobility        General bed mobility comments: pt in chair  Transfers Overall transfer level: Needs assistance Equipment used: Rolling walker (2 wheeled) Transfers: Sit to/from Stand Sit to Stand: Min assist         General transfer comment: Pt required assist to boost into standing    Balance Overall balance assessment: Needs assistance Sitting-balance support: No upper extremity supported;Feet supported Sitting balance-Leahy Scale: Fair     Standing balance support: Bilateral upper extremity supported Standing balance-Leahy Scale: Poor Standing balance comment: requires UE support                           ADL either performed or assessed with clinical judgement   ADL Overall ADL's : Needs assistance/impaired       Grooming Details (indicate cue type and reason): educate in two cup method for toothbrushing and use of  wash cloth to avoid bending over sink Upper Body Bathing: Minimal assistance;Sitting;Bed level Upper Body Bathing Details (indicate cue type and reason): recommended use of long handled bath brush for back Lower Body Bathing: Minimal assistance;Sitting/lateral leans (recommended use of long handled bath brush)       Lower Body Dressing: Maximal assistance;Sit to/from stand Lower Body Dressing Details (indicate cue type and reason): instructed in figure 4 method for donning socks and use of reacher, daughter stating sock aid would not be effective for pt Toilet Transfer: Min guard;Ambulation     Toileting - Clothing Manipulation Details (indicate cue type and reason): instructed to avoid twisting with pericare, use of tongs, wet wipes   Tub/Shower Transfer Details (indicate cue type and reason): recommended seated showering, consider tub bench to avoid stepping over edge of tub Functional mobility during ADLs: Min guard General ADL Comments: Educated in IADL to avoid and body mechanics.     Vision       Perception     Praxis      Cognition Arousal/Alertness: Awake/alert Behavior During Therapy: WFL for tasks assessed/performed Overall Cognitive Status: Difficult to assess                                          Exercises     Shoulder Instructions       General Comments Daughter present and discussed plan for getting into house (wheelchair with ramp). Encouraged as much walking as  possible inside home and not to overuse wheelchair.    Pertinent Vitals/ Pain       Pain Assessment: Faces Pain Score: 8  Faces Pain Scale: Hurts little more Pain Location: back Pain Descriptors / Indicators: Aching Pain Intervention(s): Monitored during session;Repositioned;Premedicated before session  Home Living                                          Prior Functioning/Environment              Frequency  Min 2X/week        Progress  Toward Goals  OT Goals(current goals can now be found in the care plan section)  Progress towards OT goals: Progressing toward goals  Acute Rehab OT Goals Patient Stated Goal: to go home and have less pain OT Goal Formulation: With patient/family Time For Goal Achievement: 09/19/20 Potential to Achieve Goals: Good  Plan Discharge plan remains appropriate    Co-evaluation                 AM-PAC OT "6 Clicks" Daily Activity     Outcome Measure   Help from another person eating meals?: None Help from another person taking care of personal grooming?: A Little Help from another person toileting, which includes using toliet, bedpan, or urinal?: A Little Help from another person bathing (including washing, rinsing, drying)?: A Lot Help from another person to put on and taking off regular upper body clothing?: A Little Help from another person to put on and taking off regular lower body clothing?: A Lot 6 Click Score: 17    End of Session Equipment Utilized During Treatment: Back brace  OT Visit Diagnosis: Pain   Activity Tolerance Patient tolerated treatment well   Patient Left in chair;with call bell/phone within reach;with family/visitor present   Nurse Communication          Time: 2951-8841 OT Time Calculation (min): 24 min  Charges: OT General Charges $OT Visit: 1 Visit OT Treatments $Self Care/Home Management : 23-37 mins  Nestor Lewandowsky, OTR/L Acute Rehabilitation Services Pager: [phone] Office: [phone]  Malka So 09/06/2020, 12:51 PM

## 2020-09-08 DIAGNOSIS — M5136 Other intervertebral disc degeneration, lumbar region: Secondary | ICD-10-CM | POA: Diagnosis not present

## 2020-09-08 DIAGNOSIS — K5731 Diverticulosis of large intestine without perforation or abscess with bleeding: Secondary | ICD-10-CM | POA: Diagnosis not present

## 2020-09-08 DIAGNOSIS — M8580 Other specified disorders of bone density and structure, unspecified site: Secondary | ICD-10-CM | POA: Diagnosis not present

## 2020-09-08 DIAGNOSIS — D509 Iron deficiency anemia, unspecified: Secondary | ICD-10-CM | POA: Diagnosis not present

## 2020-09-08 DIAGNOSIS — I10 Essential (primary) hypertension: Secondary | ICD-10-CM | POA: Diagnosis not present

## 2020-09-08 DIAGNOSIS — E785 Hyperlipidemia, unspecified: Secondary | ICD-10-CM | POA: Diagnosis not present

## 2020-09-08 DIAGNOSIS — M4316 Spondylolisthesis, lumbar region: Secondary | ICD-10-CM | POA: Diagnosis not present

## 2020-09-08 DIAGNOSIS — M160 Bilateral primary osteoarthritis of hip: Secondary | ICD-10-CM | POA: Diagnosis not present

## 2020-09-08 DIAGNOSIS — M48061 Spinal stenosis, lumbar region without neurogenic claudication: Secondary | ICD-10-CM | POA: Diagnosis not present

## 2020-09-08 DIAGNOSIS — K648 Other hemorrhoids: Secondary | ICD-10-CM | POA: Diagnosis not present

## 2020-09-08 DIAGNOSIS — D62 Acute posthemorrhagic anemia: Secondary | ICD-10-CM | POA: Diagnosis not present

## 2020-09-08 DIAGNOSIS — I480 Paroxysmal atrial fibrillation: Secondary | ICD-10-CM | POA: Diagnosis not present

## 2020-09-08 DIAGNOSIS — M5137 Other intervertebral disc degeneration, lumbosacral region: Secondary | ICD-10-CM | POA: Diagnosis not present

## 2020-09-08 DIAGNOSIS — H9190 Unspecified hearing loss, unspecified ear: Secondary | ICD-10-CM | POA: Diagnosis not present

## 2020-09-08 DIAGNOSIS — M8008XD Age-related osteoporosis with current pathological fracture, vertebra(e), subsequent encounter for fracture with routine healing: Secondary | ICD-10-CM | POA: Diagnosis not present

## 2020-09-08 DIAGNOSIS — M170 Bilateral primary osteoarthritis of knee: Secondary | ICD-10-CM | POA: Diagnosis not present

## 2020-09-09 ENCOUNTER — Telehealth: Payer: Self-pay | Admitting: Family Medicine

## 2020-09-09 NOTE — Telephone Encounter (Signed)
Michelle w/[facility] is calling to get verbal orders for [facility] OT 1 week 1  2 weeks 2  1 week 2.  May leave a detail msg on her secured voice mail.

## 2020-09-13 ENCOUNTER — Other Ambulatory Visit: Payer: Self-pay

## 2020-09-13 NOTE — Telephone Encounter (Signed)
Lft verbal okay on secure VM for OT orders as described in prior message. Dm/cma

## 2020-09-13 NOTE — Telephone Encounter (Signed)
Please okay these orders  ?

## 2020-09-14 ENCOUNTER — Ambulatory Visit (INDEPENDENT_AMBULATORY_CARE_PROVIDER_SITE_OTHER): Payer: Medicare Other | Admitting: Family Medicine

## 2020-09-14 ENCOUNTER — Encounter: Payer: Self-pay | Admitting: Family Medicine

## 2020-09-14 VITALS — BP 118/70 | HR 67 | Temp 97.9°F | Ht 62.0 in | Wt 140.0 lb

## 2020-09-14 DIAGNOSIS — S32010A Wedge compression fracture of first lumbar vertebra, initial encounter for closed fracture: Secondary | ICD-10-CM | POA: Diagnosis not present

## 2020-09-14 DIAGNOSIS — Y92009 Unspecified place in unspecified non-institutional (private) residence as the place of occurrence of the external cause: Secondary | ICD-10-CM

## 2020-09-14 DIAGNOSIS — D62 Acute posthemorrhagic anemia: Secondary | ICD-10-CM

## 2020-09-14 DIAGNOSIS — W19XXXA Unspecified fall, initial encounter: Secondary | ICD-10-CM

## 2020-09-14 MED ORDER — TRAMADOL HCL 50 MG PO TABS: 100.0000 mg | ORAL_TABLET | Freq: Four times a day (QID) | ORAL | 2 refills | Status: DC | PRN

## 2020-09-14 NOTE — Progress Notes (Signed)
   Subjective:    Patient ID: Christine Middleton, female    DOB: [birth year], 87 y.o.   MRN: [id number]  HPI Here with her daughter to follow up a hospital stay from 09-04-20 to 09-06-20. She was admitted with low back and pelvic pain after she fell at home. Xrays revealed a 10% compression fracture in L1. Labs were stable except her Hgb had dropped 2 points to 9.0. She was stabilized and then sent home. Her daughter is temporarily staying with her. She complains of low back pain today but otherwise she is doing well. She is getting PT and OT at home. Tramadol helps the pain. She is eating and voiding and stooling normally.    Review of Systems  Constitutional: Negative.   Respiratory: Negative.   Cardiovascular: Negative.   Gastrointestinal: Negative.   Genitourinary: Negative.   Musculoskeletal: Positive for back pain.       Objective:   Physical Exam Constitutional:      Appearance: Normal appearance.     Comments: In a wheelchair   Cardiovascular:     Rate and Rhythm: Normal rate and regular rhythm.     Pulses: Normal pulses.     Heart sounds: Normal heart sounds.  Pulmonary:     Effort: Pulmonary effort is normal.     Breath sounds: Normal breath sounds.  Musculoskeletal:     Comments: Mildly tender in the lower back   Neurological:     Mental Status: She is alert.           Assessment & Plan:  Low back pain due to a compression fracture from a fall. She seems to be doing well. We will check a CBC today. She will continue with PT and OT.  [name], MD

## 2020-09-15 LAB — CBC WITH DIFFERENTIAL/PLATELET
Basophils Absolute: 0.1 10*3/uL (ref 0.0–0.1)
Basophils Relative: 1.1 % (ref 0.0–3.0)
Eosinophils Absolute: 0.2 10*3/uL (ref 0.0–0.7)
Eosinophils Relative: 1.7 % (ref 0.0–5.0)
HCT: 28.9 % — ABNORMAL LOW (ref 36.0–46.0)
Hemoglobin: 9.6 g/dL — ABNORMAL LOW (ref 12.0–15.0)
Lymphocytes Relative: 11.9 % — ABNORMAL LOW (ref 12.0–46.0)
Lymphs Abs: 1.1 10*3/uL (ref 0.7–4.0)
MCHC: 33.2 g/dL (ref 30.0–36.0)
MCV: 88.1 fl (ref 78.0–100.0)
Monocytes Absolute: 0.9 10*3/uL (ref 0.1–1.0)
Monocytes Relative: 9.1 % (ref 3.0–12.0)
Neutro Abs: 7.3 10*3/uL (ref 1.4–7.7)
Neutrophils Relative %: 76.2 % (ref 43.0–77.0)
Platelets: 355 10*3/uL (ref 150.0–400.0)
RBC: 3.28 Mil/uL — ABNORMAL LOW (ref 3.87–5.11)
RDW: 13.3 % (ref 11.5–15.5)
WBC: 9.5 10*3/uL (ref 4.0–10.5)

## 2020-09-18 ENCOUNTER — Other Ambulatory Visit: Payer: Self-pay | Admitting: Cardiology

## 2020-09-20 NOTE — Telephone Encounter (Signed)
Per DR Jordan's note "Will continue Eliquis at 2.5 mg bid based on age and reduced body weight"

## 2020-09-20 NOTE — Telephone Encounter (Signed)
40F, 63.5KG, SCR 1.05(09/06/20), LOVW/meng (11/26/19). Pt requestingrefill at 2.5mg  eliwuid dose when qualifies for 5mg  will toute to pharmd pool

## 2020-09-23 DIAGNOSIS — K648 Other hemorrhoids: Secondary | ICD-10-CM | POA: Diagnosis not present

## 2020-09-23 DIAGNOSIS — D509 Iron deficiency anemia, unspecified: Secondary | ICD-10-CM

## 2020-09-23 DIAGNOSIS — D62 Acute posthemorrhagic anemia: Secondary | ICD-10-CM

## 2020-09-23 DIAGNOSIS — M160 Bilateral primary osteoarthritis of hip: Secondary | ICD-10-CM

## 2020-09-23 DIAGNOSIS — K219 Gastro-esophageal reflux disease without esophagitis: Secondary | ICD-10-CM

## 2020-09-23 DIAGNOSIS — E785 Hyperlipidemia, unspecified: Secondary | ICD-10-CM

## 2020-09-23 DIAGNOSIS — M8580 Other specified disorders of bone density and structure, unspecified site: Secondary | ICD-10-CM

## 2020-09-23 DIAGNOSIS — M170 Bilateral primary osteoarthritis of knee: Secondary | ICD-10-CM

## 2020-09-23 DIAGNOSIS — K222 Esophageal obstruction: Secondary | ICD-10-CM

## 2020-09-23 DIAGNOSIS — Z9181 History of falling: Secondary | ICD-10-CM

## 2020-09-23 DIAGNOSIS — K449 Diaphragmatic hernia without obstruction or gangrene: Secondary | ICD-10-CM

## 2020-09-23 DIAGNOSIS — I10 Essential (primary) hypertension: Secondary | ICD-10-CM | POA: Diagnosis not present

## 2020-09-23 DIAGNOSIS — Z7901 Long term (current) use of anticoagulants: Secondary | ICD-10-CM

## 2020-09-23 DIAGNOSIS — M8008XD Age-related osteoporosis with current pathological fracture, vertebra(e), subsequent encounter for fracture with routine healing: Secondary | ICD-10-CM | POA: Diagnosis not present

## 2020-09-23 DIAGNOSIS — K5731 Diverticulosis of large intestine without perforation or abscess with bleeding: Secondary | ICD-10-CM | POA: Diagnosis not present

## 2020-09-23 DIAGNOSIS — E538 Deficiency of other specified B group vitamins: Secondary | ICD-10-CM

## 2020-09-23 DIAGNOSIS — M48061 Spinal stenosis, lumbar region without neurogenic claudication: Secondary | ICD-10-CM

## 2020-09-23 DIAGNOSIS — M5137 Other intervertebral disc degeneration, lumbosacral region: Secondary | ICD-10-CM

## 2020-09-23 DIAGNOSIS — Z8673 Personal history of transient ischemic attack (TIA), and cerebral infarction without residual deficits: Secondary | ICD-10-CM

## 2020-09-23 DIAGNOSIS — M4316 Spondylolisthesis, lumbar region: Secondary | ICD-10-CM

## 2020-09-23 DIAGNOSIS — M5136 Other intervertebral disc degeneration, lumbar region: Secondary | ICD-10-CM

## 2020-09-23 DIAGNOSIS — I700 Atherosclerosis of aorta: Secondary | ICD-10-CM

## 2020-09-23 DIAGNOSIS — I480 Paroxysmal atrial fibrillation: Secondary | ICD-10-CM

## 2020-09-23 DIAGNOSIS — H9190 Unspecified hearing loss, unspecified ear: Secondary | ICD-10-CM

## 2020-10-04 DIAGNOSIS — I10 Essential (primary) hypertension: Secondary | ICD-10-CM | POA: Diagnosis not present

## 2020-10-04 DIAGNOSIS — S32010D Wedge compression fracture of first lumbar vertebra, subsequent encounter for fracture with routine healing: Secondary | ICD-10-CM | POA: Diagnosis not present

## 2020-10-05 ENCOUNTER — Telehealth: Payer: Self-pay | Admitting: Family Medicine

## 2020-10-05 NOTE — Telephone Encounter (Signed)
Please okay the order

## 2020-10-05 NOTE — Telephone Encounter (Signed)
Spoke with Carita Pian advised that Dr Sarajane Jews has approved pt requested verbal orders

## 2020-10-05 NOTE — Telephone Encounter (Signed)
Ok for verbal order to extend PT

## 2020-10-05 NOTE — Telephone Encounter (Signed)
Patient is having back pain this week so [name] would like to extend PT for 2 times a week for 3 weeks.  Christine Middleton

## 2020-10-11 DIAGNOSIS — D62 Acute posthemorrhagic anemia: Secondary | ICD-10-CM | POA: Diagnosis not present

## 2020-10-11 DIAGNOSIS — M4316 Spondylolisthesis, lumbar region: Secondary | ICD-10-CM | POA: Diagnosis not present

## 2020-10-11 DIAGNOSIS — M8580 Other specified disorders of bone density and structure, unspecified site: Secondary | ICD-10-CM | POA: Diagnosis not present

## 2020-10-11 DIAGNOSIS — M48061 Spinal stenosis, lumbar region without neurogenic claudication: Secondary | ICD-10-CM | POA: Diagnosis not present

## 2020-10-11 DIAGNOSIS — M5137 Other intervertebral disc degeneration, lumbosacral region: Secondary | ICD-10-CM | POA: Diagnosis not present

## 2020-10-11 DIAGNOSIS — D509 Iron deficiency anemia, unspecified: Secondary | ICD-10-CM | POA: Diagnosis not present

## 2020-10-11 DIAGNOSIS — M170 Bilateral primary osteoarthritis of knee: Secondary | ICD-10-CM | POA: Diagnosis not present

## 2020-10-11 DIAGNOSIS — I10 Essential (primary) hypertension: Secondary | ICD-10-CM | POA: Diagnosis not present

## 2020-10-11 DIAGNOSIS — K5731 Diverticulosis of large intestine without perforation or abscess with bleeding: Secondary | ICD-10-CM | POA: Diagnosis not present

## 2020-10-11 DIAGNOSIS — I480 Paroxysmal atrial fibrillation: Secondary | ICD-10-CM | POA: Diagnosis not present

## 2020-10-11 DIAGNOSIS — M8008XD Age-related osteoporosis with current pathological fracture, vertebra(e), subsequent encounter for fracture with routine healing: Secondary | ICD-10-CM | POA: Diagnosis not present

## 2020-10-11 DIAGNOSIS — H9190 Unspecified hearing loss, unspecified ear: Secondary | ICD-10-CM | POA: Diagnosis not present

## 2020-10-11 DIAGNOSIS — E785 Hyperlipidemia, unspecified: Secondary | ICD-10-CM | POA: Diagnosis not present

## 2020-10-11 DIAGNOSIS — K648 Other hemorrhoids: Secondary | ICD-10-CM | POA: Diagnosis not present

## 2020-10-11 DIAGNOSIS — M5136 Other intervertebral disc degeneration, lumbar region: Secondary | ICD-10-CM | POA: Diagnosis not present

## 2020-10-11 DIAGNOSIS — M160 Bilateral primary osteoarthritis of hip: Secondary | ICD-10-CM | POA: Diagnosis not present

## 2020-10-13 ENCOUNTER — Telehealth: Payer: Self-pay | Admitting: Family Medicine

## 2020-10-13 NOTE — Telephone Encounter (Signed)
Patient daughter is calling and wanted to let the provider know that patients BP has been low at 99/54 while standing and sitting 106/65. Daughter wanted to see if it was ok to leave off the Losartan, please advise. CB is [phone]

## 2020-10-14 ENCOUNTER — Encounter: Payer: Self-pay | Admitting: Internal Medicine

## 2020-10-14 ENCOUNTER — Other Ambulatory Visit: Payer: Self-pay

## 2020-10-14 ENCOUNTER — Ambulatory Visit (INDEPENDENT_AMBULATORY_CARE_PROVIDER_SITE_OTHER): Payer: Medicare Other | Admitting: Internal Medicine

## 2020-10-14 VITALS — BP 100/60 | HR 88 | Temp 98.7°F

## 2020-10-14 DIAGNOSIS — I10 Essential (primary) hypertension: Secondary | ICD-10-CM

## 2020-10-14 DIAGNOSIS — B029 Zoster without complications: Secondary | ICD-10-CM

## 2020-10-14 MED ORDER — VALACYCLOVIR HCL 1 G PO TABS: 1000.0000 mg | ORAL_TABLET | Freq: Three times a day (TID) | ORAL | 0 refills | Status: AC

## 2020-10-14 NOTE — Progress Notes (Signed)
Acute Office Visit     This visit occurred during the SARS-CoV-2 public health emergency.  Safety protocols were in place, including screening questions prior to the visit, additional usage of staff PPE, and extensive cleaning of exam room while observing appropriate contact time as indicated for disinfecting solutions.    CC/Reason for Visit: Discuss rash, low blood pressures  HPI: Christine Middleton is a 87 y.o. female who is coming in today for the above mentioned reasons.  She first noticed a vesicle rash over her left lower back and groin area on Tuesday night (2 days prior).  It has become very painful.  She has been having low blood pressures at home.  Blood pressure in office today is 100/60 but at home it has been as low as 85/50.  Her daughter has been holding some of her blood pressure medications at home.  Past Medical/Surgical History: Past Medical History:  Diagnosis Date  . Diverticulitis   . Esophageal stricture   . GERD (gastroesophageal reflux disease)   . Hyperlipidemia   . Hypertension   . Shingles   . Stroke (Newborn)   . Vitamin B 12 deficiency     Past Surgical History:  Procedure Laterality Date  . ABDOMINAL HYSTERECTOMY    . APPENDECTOMY    . COLONOSCOPY  12-09-08   per Dr. Sharlett Iles (incomplete along with barium enema 12-10-08) with hemorrhoids  and diverticulae  . COLONOSCOPY WITH PROPOFOL N/A 07/10/2019   Procedure: COLONOSCOPY WITH PROPOFOL;  Surgeon: Rush Landmark Telford Nab., MD;  Location: [facility];  Service: Gastroenterology;  Laterality: N/A;  . ESOPHAGOGASTRODUODENOSCOPY  09-2004   per Dr. Sharlett Iles with dilatation   . EYE SURGERY    . HEMOSTASIS CLIP PLACEMENT  07/10/2019   Procedure: HEMOSTASIS CLIP PLACEMENT;  Surgeon: Irving Copas., MD;  Location: [facility];  Service: Gastroenterology;;  . POLYPECTOMY  07/10/2019   Procedure: POLYPECTOMY;  Surgeon: Irving Copas., MD;  Location: [facility];  Service:  Gastroenterology;;  . TONSILLECTOMY      Social History:  reports that she has never smoked. She has never used smokeless tobacco. She reports that she does not drink alcohol and does not use drugs.  Allergies: Allergies  Allergen Reactions  . Amoxicillin     REACTION: rash  . Codeine   . Lipitor [Atorvastatin]     Muscle pain, stiff joints  . Lisinopril   . Nitrofurantoin   . Tramadol     Family History:  Family History  Problem Relation Age of Onset  . Heart attack Mother   . Heart disease Father   . Breast cancer Other   . Diabetes Other   . Hypertension Other   . Stroke Other   . Heart disease Other   . Stroke Sister   . Cancer Brother   . Cancer Brother   . Diabetes Brother      Current Outpatient Medications:  .  acetaminophen (TYLENOL) 325 MG tablet, Take 325 mg by mouth every 6 (six) hours as needed for mild pain or headache., Disp: , Rfl:  .  atenolol (TENORMIN) 50 MG tablet, TAKE 2 TABLETS BY MOUTH EVERY DAY, Disp: 180 tablet, Rfl: 3 .  ELIQUIS 2.5 MG TABS tablet, TAKE 1 TABLET BY MOUTH TWICE A DAY, Disp: 180 tablet, Rfl: 1 .  hydrocortisone (ANUSOL-HC) 25 MG suppository, Place 1 suppository (25 mg total) rectally 2 (two) times daily., Disp: 24 suppository, Rfl: 11 .  losartan (COZAAR) 100 MG tablet, TAKE 1  TABLET BY MOUTH EVERY DAY (Patient taking differently: Take 100 mg by mouth every evening.), Disp: 30 tablet, Rfl: 5 .  pantoprazole (PROTONIX) 40 MG tablet, Take 1 tablet (40 mg total) by mouth daily., Disp: 90 tablet, Rfl: 3 .  polycarbophil (FIBERCON) 625 MG tablet, Take 625 mg by mouth daily., Disp: , Rfl:  .  polyethylene glycol (MIRALAX / GLYCOLAX) 17 g packet, Take 17 g by mouth daily., Disp: 14 each, Rfl: 0 .  simvastatin (ZOCOR) 20 MG tablet, TAKE 1 TABLET BY MOUTH EVERYDAY AT BEDTIME (Patient taking differently: Take 20 mg by mouth every evening. TAKE 1 TABLET BY MOUTH EVERYDAY AT BEDTIME), Disp: 90 tablet, Rfl: 3 .  valACYclovir (VALTREX) 1000 MG  tablet, Take 1 tablet (1,000 mg total) by mouth 3 (three) times daily for 7 days., Disp: 21 tablet, Rfl: 0 .  Magnesium 200 MG TABS, Take by mouth. (Patient not taking: Reported on 10/14/2020), Disp: , Rfl:  .  traMADol (ULTRAM) 50 MG tablet, Take 2 tablets (100 mg total) by mouth every 6 (six) hours as needed for moderate pain. (Patient not taking: Reported on 10/14/2020), Disp: 120 tablet, Rfl: 2  Review of Systems:  Constitutional: Denies fever, chills, diaphoresis, appetite change and fatigue.  HEENT: Denies photophobia, eye pain, redness, hearing loss, ear pain, congestion, sore throat, rhinorrhea, sneezing, mouth sores, trouble swallowing, neck pain, neck stiffness and tinnitus.   Respiratory: Denies SOB, DOE, cough, chest tightness,  and wheezing.   Cardiovascular: Denies chest pain, palpitations and leg swelling.  Gastrointestinal: Denies nausea, vomiting, abdominal pain, diarrhea, constipation, blood in stool and abdominal distention.  Genitourinary: Denies dysuria, urgency, frequency, hematuria, flank pain and difficulty urinating.  Endocrine: Denies: hot or cold intolerance, sweats, changes in hair or nails, polyuria, polydipsia. Musculoskeletal: Denies myalgias, back pain, joint swelling, arthralgias and gait problem.  Skin: Denies pallor and wound.  Neurological: Denies dizziness, seizures, syncope, weakness, light-headedness, numbness and headaches.  Hematological: Denies adenopathy. Easy bruising, personal or family bleeding history  Psychiatric/Behavioral: Denies suicidal ideation, mood changes, confusion, nervousness, sleep disturbance and agitation    Physical Exam: Vitals:   10/14/20 1021  BP: 100/60  Pulse: 88  Temp: 98.7 F (37.1 C)  TempSrc: Oral  SpO2: 99%    There is no height or weight on file to calculate BMI.   Constitutional: NAD, calm, comfortable in a wheelchair Eyes: PERRL, lids and conjunctivae normal, wears corrective lenses ENMT: Mucous membranes  are moist. Skin: Erythematous and vesicular rash around her left lower back that goes in a bandlike fashion into her left groin area. Psychiatric: Normal judgment and insight. Alert and oriented x 3. Normal mood.    Impression and Plan:  Herpes zoster without complication  - Plan: valACYclovir (VALTREX) 1000 MG tablet 3 times a day for 7 days. -She will notify us if no improvement.  Essential hypertension -She has been having hypotension.  She is on amlodipine 5 mg, atenolol 50 mg and losartan 100 mg. -I have asked that they discontinue amlodipine and be more vigilant checking ambulatory measurements and follow-up with PCP.    Patient Instructions  -Nice seeing you today!!  -Start Valtrex 1 gr three times a day for 7 days.  -Stop taking amlodipine.       Lelon Frohlich, MD [facility]

## 2020-10-14 NOTE — Patient Instructions (Signed)
-  Nice seeing you today!!  -Start Valtrex 1 gr three times a day for 7 days.  -Stop taking amlodipine.

## 2020-10-15 ENCOUNTER — Encounter: Payer: Self-pay | Admitting: Family Medicine

## 2020-10-15 ENCOUNTER — Ambulatory Visit: Payer: Medicare Other | Admitting: Family Medicine

## 2020-10-15 NOTE — Telephone Encounter (Signed)
Left a detailed message for pt daughter regarding pt BP medication, advised to call the office with any question

## 2020-10-15 NOTE — Telephone Encounter (Signed)
Spoke with pt daughter state that she got the message regarding pt BP medications, state that she will monitor the BP and call the office with an update

## 2020-10-15 NOTE — Telephone Encounter (Signed)
Have her take only the Atenolol for one week (stop the Amlodipine and the Losartan). Then report back

## 2020-10-15 NOTE — Telephone Encounter (Signed)
Spoke with daughter [name] about message. Please see office note from 10/14/2020.   Had been told to discontinue Amlodipine.  Please advise

## 2020-10-15 NOTE — Telephone Encounter (Signed)
Have her cut the Losartan in half, and give her 1/2 tab (50 mg) a day

## 2020-10-15 NOTE — Telephone Encounter (Signed)
See my other note dated today

## 2020-10-19 ENCOUNTER — Other Ambulatory Visit: Payer: Self-pay | Admitting: Family Medicine

## 2020-10-19 NOTE — Telephone Encounter (Signed)
No amlodipine on the patient med list. Please advise.

## 2020-10-21 ENCOUNTER — Other Ambulatory Visit: Payer: Self-pay

## 2020-10-22 ENCOUNTER — Encounter (HOSPITAL_COMMUNITY): Payer: Self-pay | Admitting: Emergency Medicine

## 2020-10-22 ENCOUNTER — Ambulatory Visit (INDEPENDENT_AMBULATORY_CARE_PROVIDER_SITE_OTHER): Payer: Medicare Other | Admitting: Family Medicine

## 2020-10-22 ENCOUNTER — Other Ambulatory Visit: Payer: Self-pay

## 2020-10-22 ENCOUNTER — Observation Stay (HOSPITAL_COMMUNITY)
Admission: EM | Admit: 2020-10-22 | Discharge: 2020-10-24 | Disposition: A | Discharge status: Home / Self Care | Payer: Medicare Other | Attending: Internal Medicine | Admitting: Internal Medicine

## 2020-10-22 ENCOUNTER — Encounter: Payer: Self-pay | Admitting: Family Medicine

## 2020-10-22 VITALS — BP 112/52 | HR 89 | Temp 98.5°F | Wt 124.8 lb

## 2020-10-22 DIAGNOSIS — I10 Essential (primary) hypertension: Secondary | ICD-10-CM | POA: Diagnosis not present

## 2020-10-22 DIAGNOSIS — D62 Acute posthemorrhagic anemia: Secondary | ICD-10-CM | POA: Diagnosis not present

## 2020-10-22 DIAGNOSIS — E782 Mixed hyperlipidemia: Secondary | ICD-10-CM

## 2020-10-22 DIAGNOSIS — Z20822 Contact with and (suspected) exposure to covid-19: Secondary | ICD-10-CM | POA: Insufficient documentation

## 2020-10-22 DIAGNOSIS — Z7901 Long term (current) use of anticoagulants: Secondary | ICD-10-CM | POA: Diagnosis not present

## 2020-10-22 DIAGNOSIS — K644 Residual hemorrhoidal skin tags: Secondary | ICD-10-CM | POA: Diagnosis not present

## 2020-10-22 DIAGNOSIS — R5383 Other fatigue: Secondary | ICD-10-CM | POA: Diagnosis present

## 2020-10-22 DIAGNOSIS — Z79899 Other long term (current) drug therapy: Secondary | ICD-10-CM | POA: Diagnosis not present

## 2020-10-22 DIAGNOSIS — R7309 Other abnormal glucose: Secondary | ICD-10-CM

## 2020-10-22 DIAGNOSIS — E785 Hyperlipidemia, unspecified: Secondary | ICD-10-CM | POA: Diagnosis present

## 2020-10-22 DIAGNOSIS — F418 Other specified anxiety disorders: Secondary | ICD-10-CM

## 2020-10-22 DIAGNOSIS — Z9181 History of falling: Secondary | ICD-10-CM | POA: Diagnosis not present

## 2020-10-22 DIAGNOSIS — F411 Generalized anxiety disorder: Secondary | ICD-10-CM

## 2020-10-22 DIAGNOSIS — D649 Anemia, unspecified: Secondary | ICD-10-CM | POA: Diagnosis not present

## 2020-10-22 DIAGNOSIS — R109 Unspecified abdominal pain: Secondary | ICD-10-CM

## 2020-10-22 DIAGNOSIS — B029 Zoster without complications: Secondary | ICD-10-CM | POA: Insufficient documentation

## 2020-10-22 DIAGNOSIS — I639 Cerebral infarction, unspecified: Secondary | ICD-10-CM

## 2020-10-22 DIAGNOSIS — E538 Deficiency of other specified B group vitamins: Secondary | ICD-10-CM | POA: Diagnosis not present

## 2020-10-22 DIAGNOSIS — I480 Paroxysmal atrial fibrillation: Secondary | ICD-10-CM | POA: Diagnosis not present

## 2020-10-22 DIAGNOSIS — I4891 Unspecified atrial fibrillation: Secondary | ICD-10-CM | POA: Diagnosis not present

## 2020-10-22 LAB — CBC WITH DIFFERENTIAL/PLATELET
Basophils Absolute: 0 10*3/uL (ref 0.0–0.1)
Basophils Relative: 0.6 % (ref 0.0–3.0)
Eosinophils Absolute: 0.1 10*3/uL (ref 0.0–0.7)
Eosinophils Relative: 1.1 % (ref 0.0–5.0)
HCT: 22.8 % — CL (ref 36.0–46.0)
Hemoglobin: 7.5 g/dL — CL (ref 12.0–15.0)
Lymphocytes Relative: 12.4 % (ref 12.0–46.0)
Lymphs Abs: 0.9 10*3/uL (ref 0.7–4.0)
MCHC: 32.9 g/dL (ref 30.0–36.0)
MCV: 82.1 fl (ref 78.0–100.0)
Monocytes Absolute: 0.8 10*3/uL (ref 0.1–1.0)
Monocytes Relative: 11.5 % (ref 3.0–12.0)
Neutro Abs: 5.3 10*3/uL (ref 1.4–7.7)
Neutrophils Relative %: 74.4 % (ref 43.0–77.0)
Platelets: 329 10*3/uL (ref 150.0–400.0)
RBC: 2.78 Mil/uL — ABNORMAL LOW (ref 3.87–5.11)
RDW: 13.9 % (ref 11.5–15.5)
WBC: 7.1 10*3/uL (ref 4.0–10.5)

## 2020-10-22 LAB — CBC
HCT: 23.3 % — ABNORMAL LOW (ref 36.0–46.0)
Hemoglobin: 7.1 g/dL — ABNORMAL LOW (ref 12.0–15.0)
MCH: 26.9 pg (ref 26.0–34.0)
MCHC: 30.5 g/dL (ref 30.0–36.0)
MCV: 88.3 fL (ref 80.0–100.0)
Platelets: 352 10*3/uL (ref 150–400)
RBC: 2.64 MIL/uL — ABNORMAL LOW (ref 3.87–5.11)
RDW: 13.9 % (ref 11.5–15.5)
WBC: 7.1 10*3/uL (ref 4.0–10.5)
nRBC: 0 % (ref 0.0–0.2)

## 2020-10-22 LAB — IRON: Iron: <10 ug/dL — ABNORMAL LOW (ref 42–145)

## 2020-10-22 LAB — HEPATIC FUNCTION PANEL
ALT: 8 U/L (ref 0–35)
AST: 10 U/L (ref 0–37)
Albumin: 3.8 g/dL (ref 3.5–5.2)
Alkaline Phosphatase: 97 U/L (ref 39–117)
Bilirubin, Direct: 0.1 mg/dL (ref 0.0–0.3)
Total Bilirubin: 0.5 mg/dL (ref 0.2–1.2)
Total Protein: 6.4 g/dL (ref 6.0–8.3)

## 2020-10-22 LAB — FOLATE: Folate: 13.2 ng/mL (ref >5.9)

## 2020-10-22 LAB — BASIC METABOLIC PANEL
BUN: 13 mg/dL (ref 6–23)
CO2: 24 mEq/L (ref 19–32)
Calcium: 8.8 mg/dL (ref 8.4–10.5)
Chloride: 105 mEq/L (ref 96–112)
Creatinine, Ser: 0.92 mg/dL (ref 0.40–1.20)
GFR: 52.66 mL/min — ABNORMAL LOW (ref >60.00)
Glucose, Bld: 135 mg/dL — ABNORMAL HIGH (ref 70–99)
Potassium: 3.7 mEq/L (ref 3.5–5.1)
Sodium: 139 mEq/L (ref 135–145)

## 2020-10-22 LAB — HEMOGLOBIN A1C: Hgb A1c MFr Bld: 6.1 % (ref 4.6–6.5)

## 2020-10-22 LAB — COMPREHENSIVE METABOLIC PANEL
ALT: 11 U/L (ref 0–44)
AST: 16 U/L (ref 15–41)
Albumin: 3.1 g/dL — ABNORMAL LOW (ref 3.5–5.0)
Alkaline Phosphatase: 88 U/L (ref 38–126)
Anion gap: 8 (ref 5–15)
BUN: 11 mg/dL (ref 8–23)
CO2: 21 mmol/L — ABNORMAL LOW (ref 22–32)
Calcium: 8.7 mg/dL — ABNORMAL LOW (ref 8.9–10.3)
Chloride: 108 mmol/L (ref 98–111)
Creatinine, Ser: 0.95 mg/dL (ref 0.44–1.00)
GFR, Estimated: 55 mL/min — ABNORMAL LOW (ref >60)
Glucose, Bld: 137 mg/dL — ABNORMAL HIGH (ref 70–99)
Potassium: 3.4 mmol/L — ABNORMAL LOW (ref 3.5–5.1)
Sodium: 137 mmol/L (ref 135–145)
Total Bilirubin: 0.7 mg/dL (ref 0.3–1.2)
Total Protein: 6.4 g/dL — ABNORMAL LOW (ref 6.5–8.1)

## 2020-10-22 LAB — IBC + FERRITIN
Ferritin: 6.5 ng/mL — ABNORMAL LOW (ref 10.0–291.0)
Iron: <10 ug/dL — ABNORMAL LOW (ref 42–145)
Saturation Ratios: 2.2 % — ABNORMAL LOW (ref 20.0–50.0)
Transferrin: 322 mg/dL (ref 212.0–360.0)

## 2020-10-22 LAB — SARS CORONAVIRUS 2 (TAT 6-24 HRS): SARS Coronavirus 2: NEGATIVE

## 2020-10-22 LAB — PREPARE RBC (CROSSMATCH)

## 2020-10-22 LAB — T3, FREE: T3, Free: 2.7 pg/mL (ref 2.3–4.2)

## 2020-10-22 LAB — TSH: TSH: 0.66 u[IU]/mL (ref 0.35–4.50)

## 2020-10-22 LAB — VITAMIN B12: Vitamin B-12: 417 pg/mL (ref 211–911)

## 2020-10-22 LAB — T4, FREE: Free T4: 1.26 ng/dL (ref 0.60–1.60)

## 2020-10-22 MED ORDER — VALACYCLOVIR HCL 500 MG PO TABS: 1000.0000 mg | ORAL_TABLET | Freq: Three times a day (TID) | ORAL | Status: DC

## 2020-10-22 MED ORDER — FERROUS SULFATE 325 (65 FE) MG PO TABS
325.0000 mg | ORAL_TABLET | ORAL | Status: DC
  Administered 2020-10-22 – 2020-10-24 (×2): 325 mg via ORAL
  Filled 2020-10-22 (×2): qty 1

## 2020-10-22 MED ORDER — PANTOPRAZOLE SODIUM 40 MG PO TBEC
40.0000 mg | DELAYED_RELEASE_TABLET | Freq: Every day | ORAL | Status: DC
  Administered 2020-10-23 – 2020-10-24 (×2): 40 mg via ORAL
  Filled 2020-10-22 (×2): qty 1

## 2020-10-22 MED ORDER — SIMVASTATIN 20 MG PO TABS
20.0000 mg | ORAL_TABLET | Freq: Every evening | ORAL | Status: DC
  Administered 2020-10-22 – 2020-10-23 (×2): 20 mg via ORAL
  Filled 2020-10-22 (×2): qty 1

## 2020-10-22 MED ORDER — ACETAMINOPHEN 325 MG PO TABS
325.0000 mg | ORAL_TABLET | Freq: Two times a day (BID) | ORAL | Status: DC
  Administered 2020-10-22 – 2020-10-24 (×4): 325 mg via ORAL
  Filled 2020-10-22 (×4): qty 1

## 2020-10-22 MED ORDER — MAGNESIUM OXIDE 400 (241.3 MG) MG PO TABS
400.0000 mg | ORAL_TABLET | Freq: Every day | ORAL | Status: DC
  Administered 2020-10-23 – 2020-10-24 (×2): 400 mg via ORAL
  Filled 2020-10-22 (×2): qty 1

## 2020-10-22 MED ORDER — CALCIUM POLYCARBOPHIL 625 MG PO TABS
625.0000 mg | ORAL_TABLET | Freq: Every day | ORAL | Status: DC
  Administered 2020-10-23 – 2020-10-24 (×2): 625 mg via ORAL
  Filled 2020-10-22 (×2): qty 1

## 2020-10-22 MED ORDER — ATENOLOL 50 MG PO TABS: 50.0000 mg | ORAL_TABLET | Freq: Every day | ORAL | 3 refills | Status: DC

## 2020-10-22 MED ORDER — ATENOLOL 50 MG PO TABS
50.0000 mg | ORAL_TABLET | Freq: Every day | ORAL | Status: DC
  Administered 2020-10-23 – 2020-10-24 (×2): 50 mg via ORAL
  Filled 2020-10-22 (×2): qty 1

## 2020-10-22 MED ORDER — HYDROCORTISONE ACETATE 25 MG RE SUPP
25.0000 mg | Freq: Two times a day (BID) | RECTAL | Status: DC
  Administered 2020-10-23 – 2020-10-24 (×3): 25 mg via RECTAL
  Filled 2020-10-22 (×5): qty 1

## 2020-10-22 MED ORDER — SODIUM CHLORIDE 0.9% IV SOLUTION: Freq: Once | INTRAVENOUS | Status: DC

## 2020-10-22 MED ORDER — POLYETHYLENE GLYCOL 3350 17 G PO PACK
17.0000 g | PACK | Freq: Every day | ORAL | Status: DC
  Administered 2020-10-23 – 2020-10-24 (×2): 17 g via ORAL
  Filled 2020-10-22 (×2): qty 1

## 2020-10-22 MED ORDER — LIDOCAINE 4 % EX CREA
1.0000 "application " | TOPICAL_CREAM | Freq: Every day | CUTANEOUS | Status: DC | PRN
  Filled 2020-10-22: qty 5

## 2020-10-22 MED ORDER — MIRTAZAPINE 15 MG PO TABS: 15.0000 mg | ORAL_TABLET | Freq: Every day | ORAL | 2 refills | Status: DC

## 2020-10-22 NOTE — Progress Notes (Signed)
   Subjective:    Patient ID: Christine Middleton, female    DOB: 1923-04-16, 87 y.o.   MRN: [id number]  HPI Here with her daughter for a number of issues. First she was here recently for a bout of shingles, but this is resolving well. The rash is drying up and she no longer has any pain from it. Her BP has been very low, and over the past few weeks we have stopped the Amlodipine and the Losartan completely. She is currently only taking Atenolol 100 mg daily. Her BP has come up slightly, but it still averages from 110-128 over 70-80. Her pulse is steady around 70-88. She does not sleep well, and even taking 5 mg of melatonin has not helped with this. She says she feels tired all the time, and she admits to feeling depressed a lot because she cannot do the things she used to enjoy doing. Her appetite is poor, but her daughter encourages her to eat as much as possible. She has been anemic (Hgb around 9) for the past year, and his certainly contributes to her fatigue.    Review of Systems  Constitutional: Positive for fatigue.  Respiratory: Negative.   Cardiovascular: Negative.   Gastrointestinal: Negative.   Genitourinary: Negative.   Neurological: Negative.   Psychiatric/Behavioral: Positive for dysphoric mood and sleep disturbance. Negative for agitation, behavioral problems, confusion, decreased concentration, hallucinations, self-injury and suicidal ideas. The patient is nervous/anxious.        Objective:   Physical Exam Constitutional:      Comments: In her wheelchair   Cardiovascular:     Rate and Rhythm: Normal rate and regular rhythm.     Pulses: Normal pulses.     Heart sounds: Normal heart sounds.  Pulmonary:     Effort: Pulmonary effort is normal.     Breath sounds: Normal breath sounds.  Neurological:     General: No focal deficit present.     Mental Status: She is alert. Mental status is at baseline.  Psychiatric:        Behavior: Behavior normal.        Thought Content: Thought  content normal.        Judgment: Judgment normal.     Comments: Affect is mildly depressed            Assessment & Plan:  Her shingles are resolving as expected. For the hypotension, we will decrease the Atenolol to 50 mg daily. To help her depression and hopefully to help with sleep and appetite we will start her on Remeron 15 mg at bedtime. For the anemia we will get labs such as CBC, ferritin, B12, etc. Recheck at her well exam in 3 weeks  [name], MD

## 2020-10-22 NOTE — Progress Notes (Signed)
Pt received from ED in stable condition. Pt has 1st unit of PRBC running into LFA PIV. Daughter at bedside.

## 2020-10-22 NOTE — ED Triage Notes (Signed)
Patient sent to ED for anemia, Hgb 7.5. Patient denies known source of bleeding. Patient is alert, oriented, and in no apparent distress at this time.

## 2020-10-22 NOTE — ED Notes (Signed)
Report given to Mickel Baas, RN on the 5th floor.

## 2020-10-22 NOTE — H&P (Addendum)
History and Physical    Christine Middleton DUK:[id number] DOB: [birth year] DOA: 10/22/2020  PCP: Laurey Morale, MD  Patient coming from: Home, daughter at bedside   I have personally briefly reviewed patient's old medical records in [place]  Chief Complaint: Anemia  HPI: Christine Middleton is a 87 y.o. female with medical history significant for paroxysmal atrial fibrillation on Eliquis, history of external hemorrhoid bleed, hypertension, CVA, hyperlipidemia, PMR and depression/anxiety who presents with concerns of symptomatic anemia.  Patient has history of chronic intermittent rectal hemorrhoid bleed and noticed it for several days last week.  She would notice bright red blood per rectum whenever she used the toilet and wiped and saw blood dripping into the toilet.  This has stopped for the last 4 days.  However, she feels generalized malaise and has difficulty focusing.  She was seen by her PCP today for a recent hospital follow-up for a fall resulting in L1 vertebral fracture and had lab work done showing worsening anemia and was asked to present to the ED.  In the ED, she was afebrile and normotensive.  Her hemoglobin was found to be at 7.1 from a recent baseline of around 9.  No noted bright red blood per rectum or hematochezia found by ED PA on exam.  Lab work performed by PCP today showed normal vitamin B12 and folate but showed significant iron deficiency on iron panel.  Patient lives independently since her husband passed 4 years ago.  However since her fall she has adult children that comes to help her. Daughters says that she has not been treating her hemorrhoids well and they were very inflamed last week. Patient also notes bilateral lower quadrant abdominal pain but daughter at bedside states that she has been hurting there ever since her recent fall and actually thinks that it is improved.  She also recently developed shingles to her left groin region which likely added to her  pain.  Review of Systems: Constitutional: No Weight Change, No Fever ENT/Mouth: No sore throat, No Rhinorrhea Eyes: No Eye Pain, No Vision Changes Cardiovascular: No Chest Pain, no SOB Respiratory: No Cough, No Sputum, No Wheezing, no Dyspnea  Gastrointestinal: No Nausea, No Vomiting, No Diarrhea, + Constipation, + Pain Genitourinary: no Urinary Incontinence,Musculoskeletal: No Arthralgias, No Myalgias Skin: No Skin Lesions, No Pruritus, Neuro: + Weakness, No Numbness,  No Loss of Consciousness, No Syncope Psych: No Anxiety/Panic, No Depression, no decrease appetite Heme/Lymph: No Bruising, No Bleeding  Past Medical History:  Diagnosis Date  . Diverticulitis   . Esophageal stricture   . GERD (gastroesophageal reflux disease)   . Hyperlipidemia   . Hypertension   . Shingles   . Stroke ([place])   . Vitamin B 12 deficiency     Past Surgical History:  Procedure Laterality Date  . ABDOMINAL HYSTERECTOMY    . APPENDECTOMY    . COLONOSCOPY  12-09-08   per Dr. Sharlett Iles (incomplete along with barium enema 12-10-08) with hemorrhoids  and diverticulae  . COLONOSCOPY WITH PROPOFOL N/A 07/10/2019   Procedure: COLONOSCOPY WITH PROPOFOL;  Surgeon: Rush Landmark Telford Nab., MD;  Location: [facility];  Service: Gastroenterology;  Laterality: N/A;  . ESOPHAGOGASTRODUODENOSCOPY  09-2004   per Dr. Sharlett Iles with dilatation   . EYE SURGERY    . HEMOSTASIS CLIP PLACEMENT  07/10/2019   Procedure: HEMOSTASIS CLIP PLACEMENT;  Surgeon: Irving Copas., MD;  Location: [facility];  Service: Gastroenterology;;  . POLYPECTOMY  07/10/2019   Procedure: POLYPECTOMY;  Surgeon: Rush Landmark,  Telford Nab., MD;  Location: [facility];  Service: Gastroenterology;;  . TONSILLECTOMY       reports that she has never smoked. She has never used smokeless tobacco. She reports that she does not drink alcohol and does not use drugs. Social History  Allergies  Allergen Reactions  . Amoxicillin     REACTION:  rash  . Codeine   . Lipitor [Atorvastatin]     Muscle pain, stiff joints  . Lisinopril   . Nitrofurantoin   . Tramadol     Family History  Problem Relation Age of Onset  . Heart attack Mother   . Heart disease Father   . Breast cancer Other   . Diabetes Other   . Hypertension Other   . Stroke Other   . Heart disease Other   . Stroke Sister   . Cancer Brother   . Cancer Brother   . Diabetes Brother      Prior to Admission medications   Medication Sig Start Date End Date Taking? Authorizing Provider  acetaminophen (TYLENOL) 325 MG tablet Take 325 mg by mouth 2 (two) times daily.   Yes [provider]  atenolol (TENORMIN) 50 MG tablet Take 1 tablet (50 mg total) by mouth daily. Patient taking differently: Take 100 mg by mouth daily. 10/22/20  Yes Laurey Morale, MD  ELIQUIS 2.5 MG TABS tablet TAKE 1 TABLET BY MOUTH TWICE A DAY Patient taking differently: Take 2.5 mg by mouth 2 (two) times daily. 09/20/20  Yes Martinique, Peter M, MD  hydrocortisone (ANUSOL-HC) 25 MG suppository Place 1 suppository (25 mg total) rectally 2 (two) times daily. Patient taking differently: Place 25 mg rectally 2 (two) times daily as needed for hemorrhoids. 08/26/20  Yes Laurey Morale, MD  lidocaine (LMX) 4 % cream Apply 1 application topically daily as needed (pain).   Yes [provider]  Magnesium 200 MG TABS Take 200 mg by mouth daily.   Yes [provider]  pantoprazole (PROTONIX) 40 MG tablet Take 1 tablet (40 mg total) by mouth daily. 11/07/19  Yes Laurey Morale, MD  polycarbophil (FIBERCON) 625 MG tablet Take 625 mg by mouth daily.   Yes [provider]  polyethylene glycol (MIRALAX / GLYCOLAX) 17 g packet Take 17 g by mouth daily. 07/12/19  Yes Harold Hedge, MD  simvastatin (ZOCOR) 20 MG tablet TAKE 1 TABLET BY MOUTH EVERYDAY AT BEDTIME Patient taking differently: Take 20 mg by mouth every evening. 08/17/20  Yes Laurey Morale, MD  melatonin 5 MG TABS Take 5 mg  by mouth at bedtime.    [provider]  mirtazapine (REMERON) 15 MG tablet Take 1 tablet (15 mg total) by mouth at bedtime. 10/22/20   Laurey Morale, MD  traMADol (ULTRAM) 50 MG tablet Take 2 tablets (100 mg total) by mouth every 6 (six) hours as needed for moderate pain. Patient not taking: No sig reported 09/14/20   Laurey Morale, MD  valACYclovir (VALTREX) 1000 MG tablet Take 1,000 mg by mouth 3 (three) times daily.    [provider]    Physical Exam: Vitals:   10/22/20 1729 10/22/20 1830 10/22/20 1916 10/22/20 1931  BP: (!) 146/62 133/75 (!) 148/64 (!) 145/70  Pulse: 84 85 86 88  Resp: 16 (!) 21 (!) 26 20  Temp: 98.2 F (36.8 C)  97.8 F (36.6 C) 97.7 F (36.5 C)  TempSrc:   Oral Oral  SpO2: 99% 100%  100%  Constitutional: NAD, calm, comfortable, thin elderly female laying flat in bed  Vitals:   10/22/20 1729 10/22/20 1830 10/22/20 1916 10/22/20 1931  BP: (!) 146/62 133/75 (!) 148/64 (!) 145/70  Pulse: 84 85 86 88  Resp: 16 (!) 21 (!) 26 20  Temp: 98.2 F (36.8 C)  97.8 F (36.6 C) 97.7 F (36.5 C)  TempSrc:   Oral Oral  SpO2: 99% 100%  100%   Eyes: PERRL, lids and conjunctivae normal ENMT: Mucous membranes are moist. Neck: normal, supple Respiratory: clear to auscultation bilaterally, no wheezing, no crackles. Normal respiratory effort. No accessory muscle use.  Cardiovascular: Regular rate and rhythm, no murmurs / rubs / gallops. No extremity edema.  Abdomen: significant tenderness to umbilical and left lower abdomen with no rebound tenderness, guarding or rigidity,, no masses palpated.  Bowel sounds positive.  Musculoskeletal: no clubbing / cyanosis. No joint deformity upper and lower extremities. Good ROM, no contractures. Normal muscle tone.  Skin: no rashes, lesions, ulcers. No induration Neurologic: CN 2-12 grossly intact. Sensation intact, Strength 5/5 in all 4.  Psychiatric: Normal judgment and insight. Alert and oriented x 3. Normal mood  and later tearful.     Labs on Admission: I have personally reviewed following labs and imaging studies  CBC: Recent Labs  Lab 10/22/20 1439 10/22/20 1735  WBC 7.1 7.1  NEUTROABS 5.3  --   HGB 7.5 Repeated and verified X2.* 7.1*  HCT 22.8 Repeated and verified X2.* 23.3*  MCV 82.1 88.3  PLT 329.0 239   Basic Metabolic Panel: Recent Labs  Lab 10/22/20 1439 10/22/20 1735  NA 139 137  K 3.7 3.4*  CL 105 108  CO2 24 21*  GLUCOSE 135* 137*  BUN 13 11  CREATININE 0.92 0.95  CALCIUM 8.8 8.7*   GFR: Estimated Creatinine Clearance: 27.4 mL/min (by C-G formula based on SCr of 0.95 mg/dL). Liver Function Tests: Recent Labs  Lab 10/22/20 1439 10/22/20 1735  AST 10 16  ALT 8 11  ALKPHOS 97 88  BILITOT 0.5 0.7  PROT 6.4 6.4*  ALBUMIN 3.8 3.1*   No results for input(s): LIPASE, AMYLASE in the last 168 hours. No results for input(s): AMMONIA in the last 168 hours. Coagulation Profile: No results for input(s): INR, PROTIME in the last 168 hours. Cardiac Enzymes: No results for input(s): CKTOTAL, CKMB, CKMBINDEX, TROPONINI in the last 168 hours. BNP (last 3 results) No results for input(s): PROBNP in the last 8760 hours. HbA1C: Recent Labs    10/22/20 1439  HGBA1C 6.1   CBG: No results for input(s): GLUCAP in the last 168 hours. Lipid Profile: No results for input(s): CHOL, HDL, LDLCALC, TRIG, CHOLHDL, LDLDIRECT in the last 72 hours. Thyroid Function Tests: Recent Labs    10/22/20 1439  TSH 0.66  FREET4 1.26  T3FREE 2.7   Anemia Panel: Recent Labs    10/22/20 1439  VITAMINB12 417  FOLATE 13.2  FERRITIN 6.5*  IRON <10*  <10*   Urine analysis:    Component Value Date/Time   COLORURINE YELLOW 09/05/2020 1331   APPEARANCEUR CLEAR 09/05/2020 1331   LABSPEC 1.041 (H) 09/05/2020 1331   PHURINE 5.0 09/05/2020 1331   GLUCOSEU NEGATIVE 09/05/2020 1331   HGBUR NEGATIVE 09/05/2020 1331   HGBUR negative 08/31/2009 0927   BILIRUBINUR NEGATIVE 09/05/2020  1331   BILIRUBINUR neg 10/02/2018 1058   KETONESUR 20 (A) 09/05/2020 1331   PROTEINUR NEGATIVE 09/05/2020 1331   UROBILINOGEN 0.2 10/02/2018 1058   UROBILINOGEN 0.2 10/04/2013 2030  NITRITE NEGATIVE 09/05/2020 1331   LEUKOCYTESUR NEGATIVE 09/05/2020 1331    Radiological Exams on Admission: No results found.    Assessment/Plan  Symptomatic anemia from external hemorroidal bleed  -Patient presented with hemoglobin of 7.1 from baseline of 9 -Has history of chronic intermittent rectal bleed although no active bleeding at this time on exam -Had colonoscopy in 07/2019 with findings of hemorrhoids, diverticulosis and rectosigmoid oh narrowing -Has abdominal pain to bilateral lower quadrant but this has been persistent since recent fall resulting in L1 vertebral fracture.  This is actually improved per daughter.  Low suspicion for intra-abdominal process at this time although low threshold to image should she have worsening pain -Will give 2 units PRBC and repeat CBC after -Iron panel obtained by PCP today showed iron deficiency-will start low-dose iron every other day due to issues with pre-existing issues constipation - Hold Eliquis- last dose morning of 3/25 - continue bowel regimen for constipation  Hypertension Continue atenolol  Hyperlipidemia Continue simvastatin    DVT prophylaxis:SCDs Code Status: DNR Family Communication: Plan discussed with patient and daughter at bedside  disposition Plan: Home with observation Consults called:  Admission status: Observation    Level of care: Telemetry Medical  Status is: Observation  The patient remains OBS appropriate and will d/c before 2 midnights.  Dispo: The patient is from: Home              Anticipated d/c is to: Home              Patient currently is not medically stable to d/c.   Difficult to place patient No         Orene Desanctis DO [facility]   If 7PM-7AM, please contact  night-coverage [email or web address]   10/22/2020, 7:42 PM

## 2020-10-22 NOTE — ED Provider Notes (Signed)
[facility] EMERGENCY DEPARTMENT Provider Note   CSN: [id number] Arrival date & time: 10/22/20  1724     History Chief Complaint  Patient presents with  . Abnormal Lab    Christine Middleton is a 87 y.o. female presenting for evaluation of abnormal lab.  Patient states she was at her primary care doctor today for follow-up of recent hospital visit, as well as further discussion and evaluation of her fatigue.  Patient states she has not been feeling well for several weeks.  This worsened over the past week.  Last week she was found to be hypotensive with positive orthostatics.  Her blood pressure medication was decreased, this improved her BP, but she continues to feel poorly.  Patient states last week she had heavy bleeding from her external hemorrhoids, however has not had any bleeding in the past few days.  She is on Eliquis, has been taking it as prescribed.  She denies fevers, chills, chest pain, shortness of breath, nausea, vomiting, abd pain.  She reports decreased urination.  No change in bowel movements.  She has had stable anemia for several years, but never needed blood transfusion before.  Additional history obtained from chart review.  Reviewed patient's recent admission after a fall, at that time she was found to have a GI bleed thought to be due to hemorrhoids.  She had stable hemoglobin and did not need blood transfusion at that time.  Patient with a history of diverticulosis, GERD, hypertension, hyperlipidemia, previous CVA, recent diagnosis of shingles.  HPI     Past Medical History:  Diagnosis Date  . Diverticulitis   . Esophageal stricture   . GERD (gastroesophageal reflux disease)   . Hyperlipidemia   . Hypertension   . Shingles   . Stroke ([place])   . Vitamin B 12 deficiency     Patient Active Problem List   Diagnosis Date Noted  . Closed compression fracture of body of L1 vertebra ([place]) 09/04/2020  . Fall at home, initial encounter 09/04/2020  .  Acute on chronic blood loss anemia 09/04/2020  . Rectal bleeding 07/08/2019  . Depression with anxiety 09/26/2017  . Hypertension 01/17/2017  . Pedal edema 10/18/2016  . TMJ (dislocation of temporomandibular joint) 09/18/2016  . 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  . Cerebrovascular accident (CVA) ([place])   . Embolic stroke ([place]) 79/89/2119  . Facial droop 08/18/2016  . Cerebral infarction ([place]) 10/04/2013  . HYPERGLYCEMIA 06/30/2010  . Cerebral artery occlusion with cerebral infarction ([place]) 07/19/2009  . Disorder resulting from impaired renal function 07/19/2009  . Polymyalgia rheumatica ([place]) 07/19/2009  . B12 deficiency 06/30/2009  . Anxiety state 06/28/2009  . Hereditary and idiopathic peripheral neuropathy 06/28/2009  . External hemorrhoids 12/03/2008  . ESOPHAGEAL STRICTURE 11/30/2008  . Diverticulosis of colon 11/30/2008  . GROSS HEMATURIA 11/04/2008  . MYALGIA 11/04/2008  . Constipation 06/24/2008  . ACUTE CYSTITIS 03/12/2008  . HLD (hyperlipidemia) 05/30/2007  . SKIN LESION 05/30/2007  . Essential hypertension 03/19/2007  . GERD 03/19/2007    Past Surgical History:  Procedure Laterality Date  . ABDOMINAL HYSTERECTOMY    . APPENDECTOMY    . COLONOSCOPY  12-09-08   per Dr. Sharlett Iles (incomplete along with barium enema 12-10-08) with hemorrhoids  and diverticulae  . COLONOSCOPY WITH PROPOFOL N/A 07/10/2019   Procedure: COLONOSCOPY WITH PROPOFOL;  Surgeon: Rush Landmark Telford Nab., MD;  Location: [facility];  Service: Gastroenterology;  Laterality: N/A;  . ESOPHAGOGASTRODUODENOSCOPY  09-2004   per Dr. Sharlett Iles with dilatation   .  EYE SURGERY    . HEMOSTASIS CLIP PLACEMENT  07/10/2019   Procedure: HEMOSTASIS CLIP PLACEMENT;  Surgeon: Irving Copas., MD;  Location: [facility];  Service: Gastroenterology;;  . POLYPECTOMY  07/10/2019   Procedure: POLYPECTOMY;  Surgeon: Irving Copas., MD;  Location: [facility];  Service: Gastroenterology;;  . TONSILLECTOMY        OB History   No obstetric history on file.     Family History  Problem Relation Age of Onset  . Heart attack Mother   . Heart disease Father   . Breast cancer Other   . Diabetes Other   . Hypertension Other   . Stroke Other   . Heart disease Other   . Stroke Sister   . Cancer Brother   . Cancer Brother   . Diabetes Brother     Social History   Tobacco Use  . Smoking status: Never Smoker  . Smokeless tobacco: Never Used  Vaping Use  . Vaping Use: Never used  Substance Use Topics  . Alcohol use: No    Alcohol/week: 0.0 standard drinks  . Drug use: No    Home Medications Prior to Admission medications   Medication Sig Start Date End Date Taking? Authorizing Provider  acetaminophen (TYLENOL) 325 MG tablet Take 325 mg by mouth every 6 (six) hours as needed for mild pain or headache.    [provider]  atenolol (TENORMIN) 50 MG tablet Take 1 tablet (50 mg total) by mouth daily. 10/22/20   Laurey Morale, MD  ELIQUIS 2.5 MG TABS tablet TAKE 1 TABLET BY MOUTH TWICE A DAY 09/20/20   Martinique, Peter M, MD  hydrocortisone (ANUSOL-HC) 25 MG suppository Place 1 suppository (25 mg total) rectally 2 (two) times daily. 08/26/20   Laurey Morale, MD  Magnesium 200 MG TABS Take by mouth.    [provider]  mirtazapine (REMERON) 15 MG tablet Take 1 tablet (15 mg total) by mouth at bedtime. 10/22/20   Laurey Morale, MD  pantoprazole (PROTONIX) 40 MG tablet Take 1 tablet (40 mg total) by mouth daily. 11/07/19   Laurey Morale, MD  polycarbophil (FIBERCON) 625 MG tablet Take 625 mg by mouth daily.    [provider]  polyethylene glycol (MIRALAX / GLYCOLAX) 17 g packet Take 17 g by mouth daily. 07/12/19   Harold Hedge, MD  simvastatin (ZOCOR) 20 MG tablet TAKE 1 TABLET BY MOUTH EVERYDAY AT BEDTIME Patient taking differently: Take 20 mg by mouth every evening. TAKE 1 TABLET BY MOUTH EVERYDAY AT BEDTIME 08/17/20   Laurey Morale, MD  traMADol (ULTRAM) 50 MG tablet  Take 2 tablets (100 mg total) by mouth every 6 (six) hours as needed for moderate pain. 09/14/20   Laurey Morale, MD    Allergies    Amoxicillin, Codeine, Lipitor [atorvastatin], Lisinopril, Nitrofurantoin, and Tramadol  Review of Systems   Review of Systems  Gastrointestinal: Positive for blood in stool.  Genitourinary: Positive for decreased urine volume.  Neurological: Positive for weakness.  Hematological: Bruises/bleeds easily.  All other systems reviewed and are negative.   Physical Exam Updated Vital Signs BP (!) 146/62 (BP Location: Left Arm)   Pulse 84   Temp 98.2 F (36.8 C)   Resp 16   SpO2 99%   Physical Exam Vitals and nursing note reviewed. Exam conducted with a chaperone present.  Constitutional:      General: She is not in acute distress.    Appearance: She is  well-developed.     Comments: Appears chronically ill, not in acute distress  HENT:     Head: Normocephalic and atraumatic.  Eyes:     Extraocular Movements: Extraocular movements intact.     Conjunctiva/sclera: Conjunctivae normal.     Pupils: Pupils are equal, round, and reactive to light.  Cardiovascular:     Rate and Rhythm: Normal rate and regular rhythm.     Pulses: Normal pulses.  Pulmonary:     Effort: Pulmonary effort is normal. No respiratory distress.     Breath sounds: Normal breath sounds. No wheezing.  Abdominal:     General: There is no distension.     Palpations: Abdomen is soft. There is no mass.     Tenderness: There is no abdominal tenderness. There is no guarding or rebound.  Genitourinary:    Comments: External hemorrhoids noted without active bleeding.  No melena or hematochezia Musculoskeletal:        General: Normal range of motion.     Cervical back: Normal range of motion and neck supple.  Skin:    General: Skin is warm and dry.     Capillary Refill: Capillary refill takes less than 2 seconds.  Neurological:     Mental Status: She is alert and oriented to person,  place, and time.     ED Results / Procedures / Treatments   Labs (all labs ordered are listed, but only abnormal results are displayed) Labs Reviewed  CBC - Abnormal; Notable for the following components:      Result Value   RBC 2.64 (*)    Hemoglobin 7.1 (*)    HCT 23.3 (*)    All other components within normal limits  SARS CORONAVIRUS 2 (TAT 6-24 HRS)  COMPREHENSIVE METABOLIC PANEL  POC OCCULT BLOOD, ED  TYPE AND SCREEN  PREPARE RBC (CROSSMATCH)    EKG None  Radiology No results found.  Procedures .Critical Care Performed by: Franchot Heidelberg, PA-C Authorized by: Franchot Heidelberg, PA-C   Critical care provider statement:    Critical care time (minutes):  40   Critical care time was exclusive of:  Separately billable procedures and treating other patients and teaching time   Critical care was necessary to treat or prevent imminent or life-threatening deterioration of the following conditions:  Circulatory failure   Critical care was time spent personally by me on the following activities:  Blood draw for specimens, development of treatment plan with patient or surrogate, evaluation of patient's response to treatment, examination of patient, obtaining history from patient or surrogate, ordering and performing treatments and interventions, ordering and review of laboratory studies, ordering and review of radiographic studies, pulse oximetry, re-evaluation of patient's condition and review of old charts   I assumed direction of critical care for this patient from another provider in my specialty: no     Care discussed with: admitting provider   Comments:     Pt with acute on chronic hgb requiring transfusion and admission.      Medications Ordered in ED Medications  0.9 %  sodium chloride infusion (Manually program via Guardrails IV Fluids) (has no administration in time range)    ED Course  I have reviewed the triage vital signs and the nursing notes.  Pertinent  labs & imaging results that were available during my care of the patient were reviewed by me and considered in my medical decision making (see chart for details).    MDM Rules/Calculators/A&P  Patient presented for evaluation of weakness and low blood pressure in the setting of acute on chronic anemia.  Patient had heavy rectal bleeding last week, though none currently.  She is on Eliquis.  Labs from PCP reviewed by myself, shows a 2 g drop from baseline.  In the setting of recent episodes of hypotension and fatigue, patient will likely need a blood transfusion and admission.  Discussed with patient and daughter, who are agreeable.  Likely bleeding from external hemorrhoids, she has no abdominal pain, fever, nausea, vomiting to indicate diverticulitis, I do not believe she needs emergent CT scan.   Discussed with Dr. Flossie Buffy from [facility], patient to be admitted.  Final Clinical Impression(s) / ED Diagnoses Final diagnoses:  Symptomatic anemia    Rx / DC Orders ED Discharge Orders    None       Franchot Heidelberg, PA-C 10/22/20 1904    Lucrezia Starch, MD 10/22/20 2124

## 2020-10-23 ENCOUNTER — Observation Stay (HOSPITAL_COMMUNITY): Payer: Medicare Other

## 2020-10-23 ENCOUNTER — Encounter (HOSPITAL_COMMUNITY): Payer: Self-pay | Admitting: Family Medicine

## 2020-10-23 ENCOUNTER — Other Ambulatory Visit: Payer: Self-pay

## 2020-10-23 DIAGNOSIS — D649 Anemia, unspecified: Secondary | ICD-10-CM | POA: Diagnosis not present

## 2020-10-23 DIAGNOSIS — R109 Unspecified abdominal pain: Secondary | ICD-10-CM | POA: Diagnosis not present

## 2020-10-23 DIAGNOSIS — R1084 Generalized abdominal pain: Secondary | ICD-10-CM | POA: Diagnosis not present

## 2020-10-23 LAB — TYPE AND SCREEN
ABO/RH(D): O POS
Antibody Screen: NEGATIVE
Unit division: 0
Unit division: 0

## 2020-10-23 LAB — BASIC METABOLIC PANEL
Anion gap: 8 (ref 5–15)
BUN: 10 mg/dL (ref 8–23)
CO2: 22 mmol/L (ref 22–32)
Calcium: 8.8 mg/dL — ABNORMAL LOW (ref 8.9–10.3)
Chloride: 107 mmol/L (ref 98–111)
Creatinine, Ser: 0.86 mg/dL (ref 0.44–1.00)
GFR, Estimated: >60 mL/min (ref >60)
Glucose, Bld: 115 mg/dL — ABNORMAL HIGH (ref 70–99)
Potassium: 3.8 mmol/L (ref 3.5–5.1)
Sodium: 137 mmol/L (ref 135–145)

## 2020-10-23 LAB — BPAM RBC
Blood Product Expiration Date: 202204222359
Blood Product Expiration Date: 202204232359
ISSUE DATE / TIME: 202203251857
ISSUE DATE / TIME: 202203252202
Unit Type and Rh: 5100
Unit Type and Rh: 5100

## 2020-10-23 LAB — CBC
HCT: 29.8 % — ABNORMAL LOW (ref 36.0–46.0)
HCT: 30 % — ABNORMAL LOW (ref 36.0–46.0)
Hemoglobin: 9.8 g/dL — ABNORMAL LOW (ref 12.0–15.0)
Hemoglobin: 10.3 g/dL — ABNORMAL LOW (ref 12.0–15.0)
MCH: 28.5 pg (ref 26.0–34.0)
MCH: 28.8 pg (ref 26.0–34.0)
MCHC: 32.9 g/dL (ref 30.0–36.0)
MCHC: 34.3 g/dL (ref 30.0–36.0)
MCV: 86.6 fL (ref 80.0–100.0)
MCV: 83.8 fL (ref 80.0–100.0)
Platelets: 301 10*3/uL (ref 150–400)
Platelets: 321 10*3/uL (ref 150–400)
RBC: 3.44 MIL/uL — ABNORMAL LOW (ref 3.87–5.11)
RBC: 3.58 MIL/uL — ABNORMAL LOW (ref 3.87–5.11)
RDW: 13.4 % (ref 11.5–15.5)
RDW: 13.5 % (ref 11.5–15.5)
WBC: 8.5 10*3/uL (ref 4.0–10.5)
WBC: 8.9 10*3/uL (ref 4.0–10.5)
nRBC: 0 % (ref 0.0–0.2)
nRBC: 0.2 % (ref 0.0–0.2)

## 2020-10-23 MED ORDER — SENNOSIDES-DOCUSATE SODIUM 8.6-50 MG PO TABS
1.0000 | ORAL_TABLET | Freq: Two times a day (BID) | ORAL | Status: DC
  Administered 2020-10-23 – 2020-10-24 (×3): 1 via ORAL
  Filled 2020-10-23 (×3): qty 1

## 2020-10-23 MED ORDER — TRAMADOL HCL 50 MG PO TABS: 50.0000 mg | ORAL_TABLET | Freq: Three times a day (TID) | ORAL | Status: DC | PRN

## 2020-10-23 MED ORDER — TRAMADOL HCL 50 MG PO TABS: 100.0000 mg | ORAL_TABLET | Freq: Four times a day (QID) | ORAL | Status: DC | PRN

## 2020-10-23 MED ORDER — MIRTAZAPINE 15 MG PO TABS
15.0000 mg | ORAL_TABLET | Freq: Every day | ORAL | Status: DC
  Administered 2020-10-23: 15 mg via ORAL
  Filled 2020-10-23: qty 1

## 2020-10-23 MED ORDER — APIXABAN 2.5 MG PO TABS
2.5000 mg | ORAL_TABLET | Freq: Two times a day (BID) | ORAL | Status: DC
Start: 2020-10-23 — End: 2020-10-24
  Administered 2020-10-23 – 2020-10-24 (×2): 2.5 mg via ORAL
  Filled 2020-10-23 (×2): qty 1

## 2020-10-23 NOTE — Plan of Care (Signed)

## 2020-10-23 NOTE — Progress Notes (Signed)
[facility] Progress Note  Patient: Christine Middleton    VEL:[id number]  DOA: 10/22/2020     Date of Service: the patient was seen and examined on 10/23/2020  Brief hospital course: Past medical history with paroxysmal A. fib on Eliquis, CVA, HLD, external hemorrhoid with bleeding, depression, recent zoster.  Presents with complaints of anemia. Currently plan is monitor for bleeding while on Eliquis..  Assessment and Plan: Symptomatic anemia from external hemorroidal bleed  -Patient presented with hemoglobin of 7.1 from baseline of 9 -Has history of chronic intermittent rectal bleed although no active bleeding at this time on exam -Had colonoscopy in 07/2019 with findings of hemorrhoids, diverticulosis and rectosigmoid oh narrowing SP 2 units PRBC and repeat CBC remained stable after -Iron panel obtained by PCP showed iron deficiency-will start low-dose iron every other day due to issues with pre-existing issues constipation Will monitor while initiating Eliquis in the hospital.  Left lower quadrant pain. Patient reports severe pain x-ray shows actually stool burden. Will initiate bowel regimen and monitor for improvement in pain. If pain does not improve patient will benefit from initiating Robaxin/gabapentin.  External hemorrhoid. Currently patient denies any pain. Last bleeding episode was 4 to 5 days ago. Colonoscopy in December 2020, GI recommended following. High fiber diet. - Take Sitz baths QHS indefinitely. - Use FiberCon 1 tablet PO daily. - Colace 200 mg daily. - Miralax 1 capful (17 grams) in 8 ounces of water PO daily indefinitely. - Start Anusol suppositories QHS x 1 week and then QOD x 2 weeks. - May continue previous hemorrhoid cream prescribed by PCP. - Referral to Colorectal surgery  Hypertension Continue atenolol  Hyperlipidemia Continue simvastatin  Diet: Regular diet DVT Prophylaxis:   SCDs Start: 10/22/20 1904    Advance goals of care  discussion: DNR  Family Communication: family was present at bedside, at the time of interview.  The pt provided permission to discuss medical plan with the family. Opportunity was given to ask question and all questions were answered satisfactorily.   Disposition:  Status is: Observation    Dispo: The patient is from: Home              Anticipated d/c is to: Home              Patient currently is not medically stable to d/c.   Difficult to place patient   Subjective: No nausea or vomiting.  Continues to report abdominal pain ever since her recent fall on left side.  Passing gas.  Lost 15 pounds over last 3 weeks.  She was improving.  No itching.  Nausea but no vomiting.  Also early satiety reported by the daughter.  Physical Exam:  General: Appear in mild distress, no Rash; Oral Mucosa Clear, moist. no Abnormal Neck Mass Or lumps, Conjunctiva normal  Cardiovascular: S1 and S2 Present, no Murmur, Respiratory: good respiratory effort, Bilateral Air entry present and CTA, no Crackles, no wheezes Abdomen: Bowel Sound present, Soft and left upper and lower quadrant tenderness Extremities: no Pedal edema Neurology: alert and oriented to time, place, and person affect appropriate. no new focal deficit Gait not checked due to patient safety concerns  Vitals:   10/23/20 0454 10/23/20 0900 10/23/20 1035 10/23/20 1642  BP: (!) 137/58 138/73  124/61  Pulse: 77 73  80  Resp: 18 20  16   Temp: 98.2 F (36.8 C) 98.4 F (36.9 C)  98.4 F (36.9 C)  TempSrc: Oral Oral    SpO2: 96% 96% 99%  94%  Weight:      Height:        Intake/Output Summary (Last 24 hours) at 10/23/2020 1930 Last data filed at 10/23/2020 1300 Gross per 24 hour  Intake 846.58 ml  Output -  Net 846.58 ml   Filed Weights   10/22/20 2055  Weight: 58.7 kg    Data Reviewed: I have personally reviewed and interpreted daily labs, tele strips, imaging. I reviewed all nursing notes, pharmacy notes, vitals, pertinent old  records I have discussed plan of care as described above with RN and patient/family.  CBC: Recent Labs  Lab 10/22/20 1439 10/22/20 1735 10/23/20 0203 10/23/20 1209  WBC 7.1 7.1 8.5 8.9  NEUTROABS 5.3  --   --   --   HGB 7.5 Repeated and verified X2.* 7.1* 9.8* 10.3*  HCT 22.8 Repeated and verified X2.* 23.3* 29.8* 30.0*  MCV 82.1 88.3 86.6 83.8  PLT 329.0 352 301 756   Basic Metabolic Panel: Recent Labs  Lab 10/22/20 1439 10/22/20 1735 10/23/20 0203  NA 139 137 137  K 3.7 3.4* 3.8  CL 105 108 107  CO2 24 21* 22  GLUCOSE 135* 137* 115*  BUN 13 11 10   CREATININE 0.92 0.95 0.86  CALCIUM 8.8 8.7* 8.8*    Studies: DG Abd Portable 1V  Result Date: 10/23/2020 CLINICAL DATA:  Encounter for generalized abdominal pain EXAM: PORTABLE ABDOMEN - 1 VIEW COMPARISON:  Abdomen and pelvis CT 09/04/2020 FINDINGS: Normal bowel gas pattern. No abnormal stool retention. No concerning mass effect or calcification. High-density clip at the level of the anorectal junction. Mild scarring at the left lung base. Lumbar spine degeneration and scoliosis. L1 compression fracture with progressive height loss since prior. Chronic L2 compression fracture. IMPRESSION: Normal bowel gas pattern.  No abnormal stool retention. Electronically Signed   By: Monte Fantasia M.D.   On: 10/23/2020 08:09    Scheduled Meds: . sodium chloride   Intravenous Once  . acetaminophen  325 mg Oral BID  . atenolol  50 mg Oral Daily  . ferrous sulfate  325 mg Oral QODAY  . hydrocortisone  25 mg Rectal BID  . magnesium oxide  400 mg Oral Daily  . mirtazapine  15 mg Oral QHS  . pantoprazole  40 mg Oral Daily  . polycarbophil  625 mg Oral Daily  . polyethylene glycol  17 g Oral Daily  . senna-docusate  1 tablet Oral BID  . simvastatin  20 mg Oral QPM   Continuous Infusions: PRN Meds: lidocaine, traMADol  Time spent: 35 minutes  Author: Berle Mull, MD [facility] 10/23/2020 7:30 PM  To reach On-call, see  care teams to locate the attending and reach out via [email or web address]. Between 7PM-7AM, please contact night-coverage If you still have difficulty reaching the attending provider, please page the [facility] (Director on Call) for [facility] on amion for assistance.

## 2020-10-23 NOTE — Evaluation (Signed)
Physical Therapy Evaluation Patient Details Name: Christine Middleton MRN: [id number] DOB: [birth year] Today's Date: 10/23/2020   History of Present Illness  The pt is a 87 yo female presenting with symptomatic anemia and generalized malaise. The pt had a fall ~1 month ago resulting in L1 compression fx and has been receiving HHPT since then. PMH includes: CVA, HTN, HLD, and GERD.    Clinical Impression  Pt in bed upon arrival of PT, agreeable to evaluation at this time. Prior to admission the pt was mobilizing with use of RW and supervision from family, getting assist for ADLs, and receiving HHPT to progress mobility, endurance, and strength. The pt now presents with continued limitations in functional mobility, endurance, power, strength, and stability due to above dx and chronic debility, and will continue to benefit from skilled PT to address these deficits. The pt was able to demo good tolerance for initial transfers, but required minA to minG with HHA or RW to complete with good stability. The pt was then able to complete hallway ambulation with VSS and use of RW and minG for safety. All VSS remained stable through session, but did have mild SOB and fatigue after hallway ambulation with speed intervals. Recommend return home with family support and HHPT to facilitate return to independence with mobility.      Follow Up Recommendations Home health PT;Supervision for mobility/OOB    Equipment Recommendations  None recommended by PT (pt well equipped)    Recommendations for Other Services       Precautions / Restrictions Precautions Precautions: Fall Precaution Comments: watch orthostatics Restrictions Weight Bearing Restrictions: No      Mobility  Bed Mobility Overal bed mobility: Modified Independent             General bed mobility comments: increased time, use of bed rails no assist needed    Transfers Overall transfer level: Needs assistance Equipment used: 1 person hand  held assist;Rolling walker (2 wheeled) Transfers: Sit to/from Stand Sit to Stand: Min assist         General transfer comment: minA with HHA to power up and steady, increased time to rise to standing. VC for hand placement with power up with RW  Ambulation/Gait Ambulation/Gait assistance: Min guard Gait Distance (Feet): 200 Feet Assistive device: Rolling walker (2 wheeled) Gait Pattern/deviations: Step-through pattern;Decreased stride length;Shuffle;Trunk flexed Gait velocity: 0.5 m/s Gait velocity interpretation: <1.31 ft/sec, indicative of household ambulator General Gait Details: trunk flexed with increased UE support on RW. cues for posture. pt able to complete speed intervals with increased cues. minG for safety.      Balance Overall balance assessment: Needs assistance Sitting-balance support: No upper extremity supported;Feet supported Sitting balance-Leahy Scale: Fair     Standing balance support: Single extremity supported Standing balance-Leahy Scale: Fair Standing balance comment: BUE support for gait         Rhomberg - Eyes Opened: 15 Rhomberg - Eyes Closed: 15 (with minA at times, increased lateral sway)                 Pertinent Vitals/Pain Pain Assessment: Faces Faces Pain Scale: Hurts little more Pain Location: abdomen (since fall) Pain Descriptors / Indicators: Grimacing;Discomfort;Sore Pain Intervention(s): Limited activity within patient's tolerance;Monitored during session;Repositioned    Home Living Family/patient expects to be discharged to:: Private residence Living Arrangements: Children Available Help at Discharge: Family;Available 24 hours/day Type of Home: House Home Access: Ramped entrance     Home Layout: Multi-level;Able to live on main level with  bedroom/bathroom Home Equipment: Walker - 4 wheels;Walker - 2 wheels;Bedside commode;Tub bench;Grab bars - tub/shower;Hand held shower head Additional Comments: pt son lives with her,  daughter checks on her during days    Prior Function Level of Independence: Needs assistance   Gait / Transfers Assistance Needed: limited distances with RW in home since fall ~1 month ago  ADL's / Homemaking Assistance Needed: assist from family for ADLs, IADLs  Comments: pt fully independent prior to fall, since then family have arranges assist     Hand Dominance   Dominant Hand: Right    Extremity/Trunk Assessment   Upper Extremity Assessment Upper Extremity Assessment: Generalized weakness    Lower Extremity Assessment Lower Extremity Assessment: Generalized weakness    Cervical / Trunk Assessment Cervical / Trunk Assessment: Kyphotic  Communication   Communication: HOH  Cognition Arousal/Alertness: Awake/alert Behavior During Therapy: WFL for tasks assessed/performed Overall Cognitive Status: Impaired/Different from baseline Area of Impairment: Safety/judgement                         Safety/Judgement: Decreased awareness of safety;Decreased awareness of deficits     General Comments: decreased understanding of impact of deficits on mobility, increased cues for importance of mobility and challenge with activity      General Comments General comments (skin integrity, edema, etc.): VSS on RA, HR 70-90s with gait.    Exercises     Assessment/Plan    PT Assessment Patient needs continued PT services  PT Problem List Decreased strength;Decreased activity tolerance;Decreased balance;Decreased mobility;Decreased cognition;Decreased coordination;Decreased range of motion;Decreased safety awareness       PT Treatment Interventions Gait training;DME instruction;Stair training;Functional mobility training;Therapeutic exercise;Therapeutic activities;Balance training;Patient/family education    PT Goals (Current goals can be found in the Care Plan section)  Acute Rehab PT Goals Patient Stated Goal: return home PT Goal Formulation: With patient Time For  Goal Achievement: 11/06/20 Potential to Achieve Goals: Good    Frequency Min 3X/week    AM-PAC PT "6 Clicks" Mobility  Outcome Measure Help needed turning from your back to your side while in a flat bed without using bedrails?: A Little Help needed moving from lying on your back to sitting on the side of a flat bed without using bedrails?: A Little Help needed moving to and from a bed to a chair (including a wheelchair)?: A Little Help needed standing up from a chair using your arms (e.g., wheelchair or bedside chair)?: A Little Help needed to walk in hospital room?: A Little Help needed climbing 3-5 steps with a railing? : A Lot 6 Click Score: 17    End of Session Equipment Utilized During Treatment: Gait belt Activity Tolerance: Patient tolerated treatment well Patient left: in bed;with call bell/phone within reach;with nursing/sitter in room;with family/visitor present Nurse Communication: Mobility status PT Visit Diagnosis: Muscle weakness (generalized) (M62.81);History of falling (Z91.81);Other abnormalities of gait and mobility (R26.89)    Time: 9480-1655 PT Time Calculation (min) (ACUTE ONLY): 49 min   Charges:   PT Evaluation $PT Eval Moderate Complexity: 1 Mod PT Treatments $Gait Training: 8-22 mins $Therapeutic Activity: 8-22 mins        Karma Ganja, PT, DPT   Acute Rehabilitation Department Pager #: [phone]  Otho Bellows 10/23/2020, 11:37 AM

## 2020-10-24 DIAGNOSIS — D649 Anemia, unspecified: Secondary | ICD-10-CM | POA: Diagnosis not present

## 2020-10-24 MED ORDER — FERROUS SULFATE 325 (65 FE) MG PO TABS: 325.0000 mg | ORAL_TABLET | ORAL | 0 refills | Status: DC

## 2020-10-24 MED ORDER — METHOCARBAMOL 500 MG PO TABS: 500.0000 mg | ORAL_TABLET | Freq: Three times a day (TID) | ORAL | 0 refills | Status: DC | PRN

## 2020-10-24 MED ORDER — MIRTAZAPINE 15 MG PO TABS: 7.5000 mg | ORAL_TABLET | Freq: Every day | ORAL | 2 refills | Status: DC

## 2020-10-24 MED ORDER — DOCUSATE SODIUM 100 MG PO CAPS: 200.0000 mg | ORAL_CAPSULE | Freq: Two times a day (BID) | ORAL | 0 refills | Status: DC

## 2020-10-24 NOTE — Care Management Obs Status (Signed)
[place] NOTIFICATION   Patient Details  Name: Christine Middleton MRN: [id number] Date of Birth: 01-02-1923   Medicare Observation Status Notification Given:  Yes    Pollie Friar, RN 10/24/2020, 10:17 AM

## 2020-10-24 NOTE — Progress Notes (Signed)
DISCHARGE NOTE HOME Christine Middleton to be discharged Home per MD order. Discussed prescriptions and follow up appointments with the patient and daughter. Prescriptions given; medication list explained in detail. Patient and daughter verbalized understanding.  Skin clean, dry and intact without evidence of skin break down, no evidence of skin tears noted. IV catheter discontinued intact. Site without signs and symptoms of complications. Dressing and pressure applied. Pt denies pain at the site currently. No complaints noted.  Patient free of lines, drains, and wounds.   An After Visit Summary (AVS) was printed and given to the patient. Patient escorted via wheelchair, and discharged home via private auto.  Orville Govern, RN

## 2020-10-24 NOTE — TOC Transition Note (Signed)
Transition of Care [facility]) - CM/SW Discharge Note   Patient Details  Name: Christine Middleton MRN: [id number] Date of Birth: August 29, 1922  [facility] of [facility] CM/SW Contact:  Pollie Friar, RN Phone Number: 10/24/2020, 11:25 AM   Clinical Narrative:    Patient discharging home with resumption of [place] services through [facility]. Cory with [facility] aware of admission and d/c home today.  Pt has needed DME and denies issues with home transportation or medications.  Daughter at the bedside and will provide transport home today.   Final next level of care: Home w Home Health Services Barriers to Discharge: No Barriers Identified   Patient Goals and CMS Choice   CMS Medicare.gov Compare Post Acute Care list provided to:: Patient Represenative (must comment) Choice offered to / list presented to : Adult Children  Discharge Placement                       Discharge Plan and Services                          HH Arranged: PT,OT [place]: [place] Date [facility] Agency Contacted: 10/24/20   Representative spoke with at [place]: [place] ([place]) Interventions     Readmission Risk Interventions No flowsheet data found.

## 2020-10-25 ENCOUNTER — Encounter: Payer: Self-pay | Admitting: Family Medicine

## 2020-10-25 DIAGNOSIS — D649 Anemia, unspecified: Secondary | ICD-10-CM

## 2020-10-25 NOTE — Telephone Encounter (Signed)
I put in the order for a CBC. She can just come by the lab this week

## 2020-10-26 NOTE — Telephone Encounter (Signed)
Spoke with pt son state that pt daughter will call the office to schedule a lab appointment for repeat of CBC per Dr Sarajane Jews

## 2020-10-27 ENCOUNTER — Other Ambulatory Visit: Payer: Self-pay

## 2020-10-28 ENCOUNTER — Ambulatory Visit (INDEPENDENT_AMBULATORY_CARE_PROVIDER_SITE_OTHER): Payer: Medicare Other | Admitting: Family Medicine

## 2020-10-28 ENCOUNTER — Encounter: Payer: Self-pay | Admitting: Family Medicine

## 2020-10-28 ENCOUNTER — Other Ambulatory Visit: Payer: Self-pay

## 2020-10-28 VITALS — BP 128/60 | HR 84 | Temp 98.0°F | Wt 128.0 lb

## 2020-10-28 DIAGNOSIS — D649 Anemia, unspecified: Secondary | ICD-10-CM

## 2020-10-28 DIAGNOSIS — D62 Acute posthemorrhagic anemia: Secondary | ICD-10-CM | POA: Diagnosis not present

## 2020-10-28 DIAGNOSIS — K625 Hemorrhage of anus and rectum: Secondary | ICD-10-CM

## 2020-10-28 DIAGNOSIS — I10 Essential (primary) hypertension: Secondary | ICD-10-CM

## 2020-10-28 DIAGNOSIS — D509 Iron deficiency anemia, unspecified: Secondary | ICD-10-CM

## 2020-10-28 LAB — CBC WITH DIFFERENTIAL/PLATELET
Basophils Absolute: 0.1 10*3/uL (ref 0.0–0.1)
Basophils Relative: 1.2 % (ref 0.0–3.0)
Eosinophils Absolute: 0.2 10*3/uL (ref 0.0–0.7)
Eosinophils Relative: 2.3 % (ref 0.0–5.0)
HCT: 33.2 % — ABNORMAL LOW (ref 36.0–46.0)
Hemoglobin: 10.9 g/dL — ABNORMAL LOW (ref 12.0–15.0)
Lymphocytes Relative: 15.4 % (ref 12.0–46.0)
Lymphs Abs: 1.2 10*3/uL (ref 0.7–4.0)
MCHC: 32.9 g/dL (ref 30.0–36.0)
MCV: 86.1 fl (ref 78.0–100.0)
Monocytes Absolute: 1 10*3/uL (ref 0.1–1.0)
Monocytes Relative: 12.7 % — ABNORMAL HIGH (ref 3.0–12.0)
Neutro Abs: 5.2 10*3/uL (ref 1.4–7.7)
Neutrophils Relative %: 68.4 % (ref 43.0–77.0)
Platelets: 280 10*3/uL (ref 150.0–400.0)
RBC: 3.86 Mil/uL — ABNORMAL LOW (ref 3.87–5.11)
RDW: 15.5 % (ref 11.5–15.5)
WBC: 7.6 10*3/uL (ref 4.0–10.5)

## 2020-10-28 NOTE — Progress Notes (Signed)
   Subjective:    Patient ID: Christine Middleton, female    DOB: 30-Mar-1923, 87 y.o.   MRN: [id number]  HPI Here with her daughter to follow up a hospital stay from 10-22-20 to 10-23-20 for severe anemia. On admission her Hgb was down to 7.1 from a baseline of 9. After admission she was transfused 2 units of PRBC and the Hgb was up to 10.3 by the time of DC. She has a hx of iron deficiency, and this had been exacerbated by GI losses from bleeding hemorrhoids. Her Eliquis was held for 2 days and was restarted at DC. Since getting home she feels better with more energy, but she still complains of fatigue. There has been no further blood from the rectum. Her serum iron is less than 10, and ferritin is 6.5. she was sent home to take oral iron pills every other day. In the past she has not tolerated oral iron well due to constipation.    Review of Systems  Constitutional: Positive for fatigue.  Respiratory: Negative.   Cardiovascular: Negative.   Gastrointestinal: Positive for anal bleeding and constipation. Negative for abdominal distention, abdominal pain, diarrhea, nausea, rectal pain and vomiting.       Objective:   Physical Exam Constitutional:      Comments: In a wheelchair, she looks much brighter and has better color today than the other day  Cardiovascular:     Rate and Rhythm: Normal rate and regular rhythm.     Pulses: Normal pulses.     Heart sounds: Normal heart sounds.  Pulmonary:     Effort: Pulmonary effort is normal.     Breath sounds: Normal breath sounds.  Abdominal:     General: Abdomen is flat. Bowel sounds are normal. There is no distension.     Palpations: Abdomen is soft. There is no mass.     Tenderness: There is no abdominal tenderness. There is no guarding or rebound.     Hernia: No hernia is present.  Neurological:     Mental Status: She is alert.           Assessment & Plan:  She has chronic iron deficiency anemia which was compounded by GI blood losses. After  transfusion her Hgb was up to 10.3. we will check another CBC today. I do not think she will tolerate oral iron supplements very well, so she would be an excellent candidate for IV iron infusions. We wil refer her to Hematology to consider this. We will also refer her to GI to see if the hemorrhoids can be treated in some way (banding or injections). We agreed to keep her on Eliquis for the time being, but we need to balance the benefits of stroke prevention against the risk of GI bleeding.  [name], MD

## 2020-10-29 ENCOUNTER — Telehealth: Payer: Self-pay | Admitting: Nurse Practitioner

## 2020-10-29 NOTE — Telephone Encounter (Signed)
Pt come to lab for repeat of CBC

## 2020-10-29 NOTE — Telephone Encounter (Signed)
Received a new hem referral from Dr. Sarajane Jews for IDA. Christine Middleton has been scheduled to see Christine Middleton on 4/4 at 11am. Appt date has been given to her son Christine Middleton. Aware to arrive 20 minutes early

## 2020-11-01 ENCOUNTER — Other Ambulatory Visit: Payer: Self-pay

## 2020-11-01 ENCOUNTER — Encounter: Payer: Self-pay | Admitting: Nurse Practitioner

## 2020-11-01 ENCOUNTER — Inpatient Hospital Stay: Payer: Medicare Other | Attending: Nurse Practitioner | Admitting: Nurse Practitioner

## 2020-11-01 VITALS — BP 138/67 | HR 85 | Temp 97.9°F | Resp 17 | Ht 63.0 in | Wt 129.4 lb

## 2020-11-01 DIAGNOSIS — D500 Iron deficiency anemia secondary to blood loss (chronic): Secondary | ICD-10-CM | POA: Diagnosis not present

## 2020-11-01 DIAGNOSIS — E538 Deficiency of other specified B group vitamins: Secondary | ICD-10-CM | POA: Insufficient documentation

## 2020-11-01 DIAGNOSIS — K219 Gastro-esophageal reflux disease without esophagitis: Secondary | ICD-10-CM | POA: Insufficient documentation

## 2020-11-01 DIAGNOSIS — Z79899 Other long term (current) drug therapy: Secondary | ICD-10-CM | POA: Insufficient documentation

## 2020-11-01 DIAGNOSIS — M810 Age-related osteoporosis without current pathological fracture: Secondary | ICD-10-CM | POA: Insufficient documentation

## 2020-11-01 DIAGNOSIS — E785 Hyperlipidemia, unspecified: Secondary | ICD-10-CM | POA: Insufficient documentation

## 2020-11-01 DIAGNOSIS — K649 Unspecified hemorrhoids: Secondary | ICD-10-CM | POA: Diagnosis not present

## 2020-11-01 DIAGNOSIS — K5792 Diverticulitis of intestine, part unspecified, without perforation or abscess without bleeding: Secondary | ICD-10-CM | POA: Diagnosis not present

## 2020-11-01 DIAGNOSIS — I4891 Unspecified atrial fibrillation: Secondary | ICD-10-CM | POA: Insufficient documentation

## 2020-11-01 DIAGNOSIS — I10 Essential (primary) hypertension: Secondary | ICD-10-CM | POA: Diagnosis not present

## 2020-11-01 DIAGNOSIS — Z8673 Personal history of transient ischemic attack (TIA), and cerebral infarction without residual deficits: Secondary | ICD-10-CM | POA: Insufficient documentation

## 2020-11-01 DIAGNOSIS — D509 Iron deficiency anemia, unspecified: Secondary | ICD-10-CM | POA: Insufficient documentation

## 2020-11-01 NOTE — Progress Notes (Addendum)
[place]  [phone] Fax:([phone]) [place] consult Note   Patient Care Team: Laurey Morale, MD as PCP - General [place], Peter M, MD as PCP - Cardiology (Cardiology) Germaine Pomfret, [facility] as Pharmacist (Pharmacist) 11/01/2020  CHIEF COMPLAINTS/PURPOSE OF CONSULTATION:  Iron deficiency anemia, referred by PCP Dr. [name]  HISTORY OF PRESENTING ILLNESS:  Christine Middleton 87 y.o. female with past medical history of A. fib on Eliquis, HTN, HL, recent fall with osteoporotic compression fracture at L1 and bleeding hemorrhoids with recent hospitalizations for symptomatic anemia in February and March is here because of IDA.  Her baseline hemoglobin is 9-12 since 2018.  She reports receiving B12 injections in the past.  She sustained a fall and was admitted for osteoporotic compression fracture in 08/2020, Hg 9.0 at the time, HCT 29.3, normal MCV and RDW.  CTAP with contrast on 09/04/2020 showed acute L1 compression fracture and redemonstration of compression fractures at L2 and L3, stable calcified right hepatic lobe lesion felt to be benign, and no other intra-abdominal findings.  She was seen by PCP for hospital follow-up where she admitted to having recent BRBPR x4-5 days the week prior. She also had progressive fatigue and dyspnea, was found to have worsening anemia Hg 7.1 at office, she was sent to ED 10/22/20 where hemoglobin confirmed 7.5, HCT 22.8, no other cytopenias.  B12 normal at 417, folate 13.2.  Iron panel showed serum iron < 10, 2.2% saturation, and ferritin of 6.5.  CMP on 10/22/2020 showed normal kidney function.  She was transfused 2 units RBCs, did not receive IV iron.  Previous colonoscopy 07/10/2019 by Dr. Rush Landmark showed external and internal hemorrhoids with evidence of oozing and bleeding, diverticula in the rectosigmoid colon, and a benign-appearing intrinsic severe stricture in the region of diverticulosis.  A repeat colonoscopy was not done  during this hospitalization.  She was started on oral iron once every other day.  Hemoglobin up to 10.9 on 10/28/2020 at discharge.  Socially, her son lives with her, she has strong family support from 4 children (3 sons, 1 daughter who accompanies her today and is a Animal nutritionist).  She is mostly independent with some assistance with showering.  She denies alcohol, tobacco use, or other drug use.  She denies family history of anemia or cancer.  Christine. Middleton presents today accompanied by her daughter.  She is in a wheelchair.  Since her fall in 08/2020 she has low appetite and weight loss but has gained 5.5 pounds back since being on mirtazapine.  She has low activity level due to back pain.  Her abdomen/core is sore since the fracture.  Denies recurrent rectal bleeding since hospitalization.  She is tolerating oral iron once every other day with some constipation.  On MiraLAX.  Denies any fever, chills, cough, chest pain, dyspnea, leg edema, or new pain.   MEDICAL HISTORY:  Past Medical History:  Diagnosis Date  . Diverticulitis   . Esophageal stricture   . GERD (gastroesophageal reflux disease)   . Hyperlipidemia   . Hypertension   . Shingles   . Stroke ([place])   . Vitamin B 12 deficiency     SURGICAL HISTORY: Past Surgical History:  Procedure Laterality Date  . ABDOMINAL HYSTERECTOMY    . APPENDECTOMY    . COLONOSCOPY  12-09-08   per Dr. Sharlett Iles (incomplete along with barium enema 12-10-08) with hemorrhoids  and diverticulae  . COLONOSCOPY WITH PROPOFOL N/A 07/10/2019   Procedure: COLONOSCOPY WITH  PROPOFOL;  Surgeon: Irving Copas., MD;  Location: [facility];  Service: Gastroenterology;  Laterality: N/A;  . ESOPHAGOGASTRODUODENOSCOPY  09-2004   per Dr. Sharlett Iles with dilatation   . EYE SURGERY    . HEMOSTASIS CLIP PLACEMENT  07/10/2019   Procedure: HEMOSTASIS CLIP PLACEMENT;  Surgeon: Irving Copas., MD;  Location: [facility];  Service: Gastroenterology;;  .  POLYPECTOMY  07/10/2019   Procedure: POLYPECTOMY;  Surgeon: Irving Copas., MD;  Location: [facility];  Service: Gastroenterology;;  . TONSILLECTOMY      SOCIAL HISTORY: Social History   Socioeconomic History  . Marital status: Widowed    Spouse name: Grayling Congress. Strayer  . Number of children: 4  . Years of education: HS  . Highest education level: Not on file  Occupational History  . Occupation: Retired    Fish farm manager: RETIRED  Tobacco Use  . Smoking status: Never Smoker  . Smokeless tobacco: Never Used  Vaping Use  . Vaping Use: Never used  Substance and Sexual Activity  . Alcohol use: No    Alcohol/week: 0.0 standard drinks  . Drug use: No  . Sexual activity: Not on file  Other Topics Concern  . Not on file  Social History Narrative   Patient lives at home with family.   Patient is right handed.   Patient has a high school education.   Caffeine Use: 1 cup daily   Social Determinants of Health   Financial Resource Strain: Low Risk   . Difficulty of Paying Living Expenses: Not hard at all  Food Insecurity: Not on file  Transportation Needs: No Transportation Needs  . Lack of Transportation (Medical): No  . Lack of Transportation (Non-Medical): No  Physical Activity: Not on file  Stress: Not on file  Social Connections: Not on file  Intimate Partner Violence: Not on file    FAMILY HISTORY: Family History  Problem Relation Age of Onset  . Heart attack Mother   . Heart disease Father   . Breast cancer Other   . Diabetes Other   . Hypertension Other   . Stroke Other   . Heart disease Other   . Stroke Sister   . Cancer Brother   . Cancer Brother   . Diabetes Brother     ALLERGIES:  is allergic to amoxicillin, codeine, lipitor [atorvastatin], lisinopril, nitrofurantoin, and tramadol.  MEDICATIONS:  Current Outpatient Medications  Medication Sig Dispense Refill  . acetaminophen (TYLENOL) 325 MG tablet Take 325 mg by mouth 2 (two) times daily.    Marland Kitchen  atenolol (TENORMIN) 50 MG tablet Take 1 tablet (50 mg total) by mouth daily. (Patient taking differently: Take 100 mg by mouth daily.) 180 tablet 3  . docusate sodium (COLACE) 100 MG capsule Take 2 capsules (200 mg total) by mouth 2 (two) times daily. 10 capsule 0  . ELIQUIS 2.5 MG TABS tablet TAKE 1 TABLET BY MOUTH TWICE A DAY (Patient taking differently: Take 2.5 mg by mouth 2 (two) times daily.) 180 tablet 1  . ferrous sulfate 325 (65 FE) MG tablet Take 1 tablet (325 mg total) by mouth every other day. 60 tablet 0  . hydrocortisone (ANUSOL-HC) 25 MG suppository Place 1 suppository (25 mg total) rectally 2 (two) times daily. (Patient taking differently: Place 25 mg rectally 2 (two) times daily as needed for hemorrhoids.) 24 suppository 11  . lidocaine (LMX) 4 % cream Apply 1 application topically daily as needed (pain).    . methocarbamol (ROBAXIN) 500 MG tablet Take 1  tablet (500 mg total) by mouth every 8 (eight) hours as needed for muscle spasms. 30 tablet 0  . mirtazapine (REMERON) 15 MG tablet Take 0.5 tablets (7.5 mg total) by mouth at bedtime. 30 tablet 2  . pantoprazole (PROTONIX) 40 MG tablet Take 1 tablet (40 mg total) by mouth daily. 90 tablet 3  . polycarbophil (FIBERCON) 625 MG tablet Take 625 mg by mouth daily.    . polyethylene glycol (MIRALAX / GLYCOLAX) 17 g packet Take 17 g by mouth daily. 14 each 0  . simvastatin (ZOCOR) 20 MG tablet TAKE 1 TABLET BY MOUTH EVERYDAY AT BEDTIME (Patient taking differently: Take 20 mg by mouth every evening.) 90 tablet 3   No current facility-administered medications for this visit.    REVIEW OF SYSTEMS:   Constitutional: Denies fevers, chills or abnormal night sweats (+) weight loss, gaining on mirtazapine (+) fatigue Eyes: Denies blurriness of vision, double vision or watery eyes Ears, nose, mouth, throat, and face: Denies mucositis or sore throat Respiratory: Denies cough, dyspnea or wheezes (+) dyspnea Cardiovascular: Denies palpitation,  chest discomfort or lower extremity swelling Gastrointestinal:  Denies nausea, vomiting, diarrhea, heartburn or change in bowel habits (+) recent bleeding hemorrhoids, none since 09/2020 hospitalization (+) abdominal pain (+) constipation Skin: Denies abnormal skin rashes Lymphatics: Denies new lymphadenopathy or easy bruising Neurological:Denies numbness, tingling or new weaknesses Behavioral/Psych: Mood is stable, no new changes  All other systems were reviewed with the patient and are negative.  PHYSICAL EXAMINATION: ECOG PERFORMANCE STATUS: 1 - Symptomatic but completely ambulatory  Vitals:   11/01/20 1047  BP: 138/67  Pulse: 85  Resp: 17  Temp: 97.9 F (36.6 C)  SpO2: 98%   Filed Weights   11/01/20 1047  Weight: 129 lb 6.4 oz (58.7 kg)    GENERAL:alert, no distress and comfortable SKIN: No rash to exposed skin EYES: sclera clear LUNGS: clear with normal breathing effort HEART: regular rate & rhythm, no lower extremity edema ABDOMEN:abdomen soft with normal bowel sounds, mild diffuse tenderness Musculoskeletal: Focal tenderness in the lumbar spine PSYCH: alert & oriented x 3 with fluent speech NEURO: no focal motor/sensory deficits  LABORATORY DATA:  I have reviewed the data as listed CBC Latest Ref Rng & Units 10/28/2020 10/23/2020 10/23/2020  WBC 4.0 - 10.5 K/uL 7.6 8.9 8.5  Hemoglobin 12.0 - 15.0 g/dL 10.9(L) 10.3(L) 9.8(L)  Hematocrit 36.0 - 46.0 % 33.2(L) 30.0(L) 29.8(L)  Platelets 150.0 - 400.0 K/uL 280.0 321 301    CMP Latest Ref Rng & Units 10/23/2020 10/22/2020 10/22/2020  Glucose 70 - 99 mg/dL 115(H) 137(H) 135(H)  BUN 8 - 23 mg/dL 10 11 13   Creatinine 0.44 - 1.00 mg/dL 0.86 0.95 0.92  Sodium 135 - 145 mmol/L 137 137 139  Potassium 3.5 - 5.1 mmol/L 3.8 3.4(L) 3.7  Chloride 98 - 111 mmol/L 107 108 105  CO2 22 - 32 mmol/L 22 21(L) 24  Calcium 8.9 - 10.3 mg/dL 8.8(L) 8.7(L) 8.8  Total Protein 6.5 - 8.1 g/dL - 6.4(L) 6.4  Total Bilirubin 0.3 - 1.2 mg/dL - 0.7  0.5  Alkaline Phos 38 - 126 U/L - 88 97  AST 15 - 41 U/L - 16 10  ALT 0 - 44 U/L - 11 8     RADIOGRAPHIC STUDIES: I have personally reviewed the radiological images as listed and agreed with the findings in the report. DG Abd Portable 1V  Result Date: 10/23/2020 CLINICAL DATA:  Encounter for generalized abdominal pain EXAM: PORTABLE ABDOMEN - 1  VIEW COMPARISON:  Abdomen and pelvis CT 09/04/2020 FINDINGS: Normal bowel gas pattern. No abnormal stool retention. No concerning mass effect or calcification. High-density clip at the level of the anorectal junction. Mild scarring at the left lung base. Lumbar spine degeneration and scoliosis. L1 compression fracture with progressive height loss since prior. Chronic L2 compression fracture. IMPRESSION: Normal bowel gas pattern.  No abnormal stool retention. Electronically Signed   By: Monte Fantasia M.D.   On: 10/23/2020 08:09    ASSESSMENT & PLAN: 87 year old female  1.  Iron deficiency anemia, secondary to GI blood loss  -We reviewed her medical record in detail with patient and her daughter.  -She has mild intermittent anemia Hg 9-12 since 2018, no other cytopenias. Primary bone marrow disorder is less likely  -B12, folate, and TSH/T3/T4 normal 10/22/20, her anemia is not related to these nutritional deficiencies or hormonal -renal function is normal lately, no CKD.  -Iron studies 10/22/20 show ferritin 6.5, 2% saturation ratio, serum iron <10, and Hg 7.5 on hospital admission. This is consistent with iron deficiency anemia likely from GI blood loss from hemorrhoids and severe diverticulosis (seen on last colonoscopy 07/2019).  -CT from previous admission 09/04/20 negative for other source of bleeding or malignancy. -s/p RBC transfusion x2 10/22/20, Hg improved to 10.9 on discharge from hospital. She started oral iron every other day 10/28/20. I recommend to take with vitamin C to promote absorption. -we discussed IV iron replacement which is quicker  and more tolerable than oral forms. We discussed possible side effects such as constipation, GI upset, and allergic reaction such as anaphylaxis.  -We recommend IV Venofer 300 mg weekly x3 then monitor CBC/iron studies. Will arrange additional iron replacement if needed. Patient and her daughter agree.  - will check labs with last venofer infusion and q3 months for now. If anemia does not resolve completely with adequate iron replacement, she may have small component of anemia of chronic disease from Afib on AC, HTN, and HL. She may require further testing  -Follow up in 6 months   2.  Bleeding hemorrhoids -Previous colonoscopies in 2006, 2010, and 07/2019 showed internal and external hemorrhoids (bleeding in 2020) and diverticulosis -She has been referred for hemorrhoidectomy in the past but did not pursue referral once rectal bleeding stopped -She reports having had injections in the past that controlled her rectal bleeding for up to 2 years -Referred back to Dr. Stefani Dama Roddy by PCP, I sent him a message to see if he can manage the bleeding hemorrhoids versus referral back to colorectal surgery -Bleeding likely worsened by Eliquis, but given her cardiac risk, there is benefit to keeping her on [facility] -I recommend miralax 1-2 times daily +/- stool softener to reduce straining and constipation, monitor while on iron   2.  A. fib, h/o CVA x2, HTN, HL -She sustained 2 strokes while on Plavix, she was found to have A. Fib -On Eliquis -Followed by PCP Dr. Sarajane Jews  3. Osteoporotic fracture  -CT 09/04/20 shows acute L1 fracture as well as L2 and L3 compression fractures.  -evaluated by neurosurgery, procedure not recommended -wears brace occasionally   PLAN: -Labs, imaging, colonoscopy, records reviewed -IDA secondary to GI blood loss (secondary to hemorrhoids and diverticulosis) -Continue oral iron every other day with bowel regimen -Venofer 300 mg weekly x3 -Lab with last venofer, and in 3 and 6  months. Will arrange additional iron if needed -F/up in 6 months  -Message to Dr. Rush Landmark, possible surgery referral  Orders Placed This Encounter  Procedures  . CBC with Differential (Cancer Center Only)    Standing Status:   Standing    Number of Occurrences:   50    Standing Expiration Date:   11/01/2021  . Iron and TIBC    Standing Status:   Standing    Number of Occurrences:   50    Standing Expiration Date:   11/01/2021  . Ferritin    Standing Status:   Standing    Number of Occurrences:   50    Standing Expiration Date:   11/01/2021  . CMP ([facility] only)    Standing Status:   Standing    Number of Occurrences:   20    Standing Expiration Date:   11/01/2021    All questions were answered. The patient knows to call the clinic with any problems, questions or concerns.     Alla Feeling, NP 11/01/20   Addendum  I have seen the patient, examined her. I agree with the assessment and and plan and have edited the notes.   Christine Middleton is a 87 year old female, was referred for iron deficient anemia, white blood transfusion and hospitalization last week.  I reviewed her last colonoscopy results, her iron deficiency is likely from GI bleeding from hemorrhoids or diverticulosis.  We will contact her GI, and refer to colorectal surgeon for hemorrhoid treatment if needed.  She is still quite symptomatic from iron deficient anemia, I recommend IV iron Venofer 300 mg weekly for 3 doses, and will follow up her blood counts afterworlds. All questions were answered.  Truitt Merle  11/01/2020

## 2020-11-03 ENCOUNTER — Telehealth: Payer: Self-pay | Admitting: Family Medicine

## 2020-11-03 NOTE — Telephone Encounter (Signed)
Christine Middleton from [facility] called to follow up on a fax from 03/08 in reference to an outstanding order  Order #: [id number]  [place] [phone]

## 2020-11-04 ENCOUNTER — Telehealth: Payer: Self-pay | Admitting: Family Medicine

## 2020-11-04 NOTE — Telephone Encounter (Signed)
Left a message for Christine Middleton advised to call the office back

## 2020-11-04 NOTE — Telephone Encounter (Signed)
Patient daughter is calling and stated that the provider referred her to [facility] but when called to schedule they stated that they don't treat the diagnosis on the referral, please advise. CB is [phone]

## 2020-11-04 NOTE — Discharge Summary (Signed)
[facility] Discharge Summary   Patient: Christine Middleton IOX:[id number]  PCP: Laurey Morale, MD  Date of admission: 10/22/2020   Date of discharge: 10/24/2020     Discharge Diagnoses:  Principal Problem:   Symptomatic anemia Active Problems:   HLD (hyperlipidemia)   Essential hypertension   Admitted From: home Disposition:  Home   Recommendations for Outpatient Follow-up:  1. PCP: Follow-up in 1 week with a CBC. 2. Follow up LABS/TEST: CBC 3. New Meds: Colace, ferrous sulfate, Robaxin 4. Changed meds: Remeron dose reduced 5. Stopped meds: None   Follow-up Information    [facility] Follow up.   Specialty: Home Health Services Why: The home health agency will contact you for the next home health visit. Contact information: [place] [address] [phone]        Laurey Morale, MD. Schedule an appointment as soon as possible for a visit in 1 week(s).   Specialty: Family Medicine Contact information: [place] [address] [phone]        [facility]. Schedule an appointment as soon as possible for a visit in 1 month(s).   Specialty: Gastroenterology Contact information: [place] [address] [phone]             Discharge Instructions    Diet - low sodium heart healthy   Complete by: As directed    Increase activity slowly   Complete by: As directed       Diet recommendation: Regular diet  Activity: The patient is advised to gradually reintroduce usual activities, as tolerated  Discharge Condition: stable  Code Status: DNR   History of present illness: As per the H and P dictated on admission, "Christine Middleton is a 87 y.o. female with medical history significant for paroxysmal atrial fibrillation on Eliquis, history of external hemorrhoid bleed, hypertension, CVA, hyperlipidemia, PMR and depression/anxiety who presents with  concerns of symptomatic anemia.  Patient has history of chronic intermittent rectal hemorrhoid bleed and noticed it for several days last week.  She would notice bright red blood per rectum whenever she used the toilet and wiped and saw blood dripping into the toilet.  This has stopped for the last 4 days.  However, she feels generalized malaise and has difficulty focusing.  She was seen by her PCP today for a recent hospital follow-up for a fall resulting in L1 vertebral fracture and had lab work done showing worsening anemia and was asked to present to the ED.  In the ED, she was afebrile and normotensive.  Her hemoglobin was found to be at 7.1 from a recent baseline of around 9.  No noted bright red blood per rectum or hematochezia found by ED PA on exam.  Lab work performed by PCP today showed normal vitamin B12 and folate but showed significant iron deficiency on iron panel.  Patient lives independently since her husband passed 4 years ago.  However since her fall she has adult children that comes to help her. Daughters says that she has not been treating her hemorrhoids well and they were very inflamed last week. Patient also notes bilateral lower quadrant abdominal pain but daughter at bedside states that she has been hurting there ever since her recent fall and actually thinks that it is improved.  She also recently developed shingles to her left groin region which likely added to her pain."  Hospital Course:  Summary of her active problems  in the hospital is as following. Symptomatic anemia from external hemorroidal bleed  -Patient presented with hemoglobin of 7.1 from baseline of 9 -Has history of chronic intermittent rectal bleed although no active bleeding at this time on exam -Had colonoscopy in 07/2019 with findings of hemorrhoids, diverticulosis and rectosigmoid oh narrowing SP 2 units PRBC and repeat CBC remained stable after -Iron panel obtained by PCP showed iron deficiency-will  start low-dose ironevery other day due to issues with pre-existing issues constipation Patient was monitored while initiating Eliquis in the hospital.  Left lower quadrant pain. Patient reports severe pain x-ray shows actually stool burden. Patient had bowel regimen Did not have any significant improvement in pain. Continue Robaxin on discharge.  External hemorrhoid. Currently patient denies any pain. Last bleeding episode was 4 to 5 days ago. Colonoscopy in December 2020, GI recommended following. High fiber diet. - Take Sitz baths QHS indefinitely. - Use FiberCon 1 tablet PO daily. - Colace 200 mg daily. - Miralax 1 capful (17 grams) in 8 ounces of water PO daily indefinitely. -Continue Anusol suppositories  - May continue previous hemorrhoid cream prescribed by PCP. - Referral to Colorectal surgery  Hypertension Continue atenolol  Hyperlipidemia Continue simvastatin  Paroxysmal A. Fib. Rate control for now. Recommend discussion with PCP regarding Eliquis continuation.  Anorexia. Lactulose was with patient's abdominal pain as well as chronic constipation. Patient is on Remeron. We will reduce the dose due to excessive sleepiness.   Patient was seen by physical therapy, who recommended Home Health. On the day of the discharge the patient's vitals were stable, and no other acute medical condition were reported by patient. The patient was felt safe to be discharge at Home with Home health.  Consultants: none Procedures: none  DISCHARGE MEDICATION: Allergies as of 10/24/2020      Reactions   Amoxicillin    REACTION: rash   Codeine    Lipitor [atorvastatin]    Muscle pain, stiff joints   Lisinopril    Nitrofurantoin    Tramadol Anxiety   Sedation, worsening depression.       Medication List    TAKE these medications   acetaminophen 325 MG tablet Commonly known as: TYLENOL Take 325 mg by mouth 2 (two) times daily.   atenolol 50 MG tablet Commonly  known as: TENORMIN Take 1 tablet (50 mg total) by mouth daily. What changed: how much to take   docusate sodium 100 MG capsule Commonly known as: Colace Take 2 capsules (200 mg total) by mouth 2 (two) times daily.   Eliquis 2.5 MG Tabs tablet Generic drug: apixaban TAKE 1 TABLET BY MOUTH TWICE A DAY What changed: how much to take   ferrous sulfate 325 (65 FE) MG tablet Take 1 tablet (325 mg total) by mouth every other day.   hydrocortisone 25 MG suppository Commonly known as: ANUSOL-HC Place 1 suppository (25 mg total) rectally 2 (two) times daily. What changed:   when to take this  reasons to take this   lidocaine 4 % cream Commonly known as: LMX Apply 1 application topically daily as needed (pain).   methocarbamol 500 MG tablet Commonly known as: Robaxin Take 1 tablet (500 mg total) by mouth every 8 (eight) hours as needed for muscle spasms.   mirtazapine 15 MG tablet Commonly known as: Remeron Take 0.5 tablets (7.5 mg total) by mouth at bedtime. What changed: how much to take   pantoprazole 40 MG tablet Commonly known as: PROTONIX Take 1 tablet (40 mg total) by  mouth daily.   polycarbophil 625 MG tablet Commonly known as: FIBERCON Take 625 mg by mouth daily.   polyethylene glycol 17 g packet Commonly known as: MIRALAX / GLYCOLAX Take 17 g by mouth daily.   simvastatin 20 MG tablet Commonly known as: ZOCOR TAKE 1 TABLET BY MOUTH EVERYDAY AT BEDTIME What changed: See the new instructions.       Discharge Exam: Filed Weights   10/22/20 2055 10/24/20 0442  Weight: 58.7 kg 60.6 kg   Vitals:   10/24/20 0442 10/24/20 0916  BP: (!) 160/88 (!) 151/69  Pulse: 91 84  Resp: 20 18  Temp: 98.9 F (37.2 C) 97.8 F (36.6 C)  SpO2: 96%    General: Appear in no distress, no Rash; Oral Mucosa Clear, moist. no Abnormal Neck Mass Or lumps, Conjunctiva normal  Cardiovascular: S1 and S2 Present, no Murmur Respiratory: good respiratory effort, Bilateral Air entry  present and CTA, no Crackles, no wheezes Abdomen: Bowel Sound present, Soft and no tenderness Extremities: no Pedal edema Neurology: alert and oriented to time, place, and person affect appropriate. no new focal deficit  The results of significant diagnostics from this hospitalization (including imaging, microbiology, ancillary and laboratory) are listed below for reference.    Significant Diagnostic Studies: DG Abd Portable 1V  Result Date: 10/23/2020 CLINICAL DATA:  Encounter for generalized abdominal pain EXAM: PORTABLE ABDOMEN - 1 VIEW COMPARISON:  Abdomen and pelvis CT 09/04/2020 FINDINGS: Normal bowel gas pattern. No abnormal stool retention. No concerning mass effect or calcification. High-density clip at the level of the anorectal junction. Mild scarring at the left lung base. Lumbar spine degeneration and scoliosis. L1 compression fracture with progressive height loss since prior. Chronic L2 compression fracture. IMPRESSION: Normal bowel gas pattern.  No abnormal stool retention. Electronically Signed   By: Monte Fantasia M.D.   On: 10/23/2020 08:09    Microbiology: No results found for this or any previous visit (from the past 240 hour(s)).   Labs: CBC: No results for input(s): WBC, NEUTROABS, HGB, HCT, MCV, PLT in the last 168 hours. Basic Metabolic Panel: No results for input(s): NA, K, CL, CO2, GLUCOSE, BUN, CREATININE, CALCIUM, MG, PHOS in the last 168 hours. Liver Function Tests: No results for input(s): AST, ALT, ALKPHOS, BILITOT, PROT, ALBUMIN in the last 168 hours. CBG: No results for input(s): GLUCAP in the last 168 hours.  Time spent: 35 minutes  Signed:  Berle Mull  [facility] 10/24/2020 6:42 PM

## 2020-11-05 NOTE — Addendum Note (Signed)
Addended by: [name] A on: 11/05/2020 07:51 AM   Modules accepted: Orders

## 2020-11-05 NOTE — Telephone Encounter (Signed)
Spoke with the pts daughter and informed her of the message below.

## 2020-11-05 NOTE — Telephone Encounter (Signed)
Noted. Instead, I have referred her to Surgery for the hemorrhoids

## 2020-11-08 ENCOUNTER — Ambulatory Visit (INDEPENDENT_AMBULATORY_CARE_PROVIDER_SITE_OTHER): Payer: Medicare Other | Admitting: Family Medicine

## 2020-11-08 ENCOUNTER — Telehealth: Payer: Self-pay | Admitting: Hematology

## 2020-11-08 ENCOUNTER — Encounter: Payer: Self-pay | Admitting: Family Medicine

## 2020-11-08 ENCOUNTER — Other Ambulatory Visit: Payer: Self-pay

## 2020-11-08 VITALS — BP 118/76 | HR 90 | Temp 97.4°F | Ht 63.0 in | Wt 127.8 lb

## 2020-11-08 DIAGNOSIS — E559 Vitamin D deficiency, unspecified: Secondary | ICD-10-CM

## 2020-11-08 DIAGNOSIS — I639 Cerebral infarction, unspecified: Secondary | ICD-10-CM

## 2020-11-08 DIAGNOSIS — R600 Localized edema: Secondary | ICD-10-CM

## 2020-11-08 DIAGNOSIS — E782 Mixed hyperlipidemia: Secondary | ICD-10-CM

## 2020-11-08 DIAGNOSIS — F411 Generalized anxiety disorder: Secondary | ICD-10-CM

## 2020-11-08 DIAGNOSIS — F418 Other specified anxiety disorders: Secondary | ICD-10-CM

## 2020-11-08 DIAGNOSIS — D62 Acute posthemorrhagic anemia: Secondary | ICD-10-CM

## 2020-11-08 DIAGNOSIS — E538 Deficiency of other specified B group vitamins: Secondary | ICD-10-CM

## 2020-11-08 DIAGNOSIS — M353 Polymyalgia rheumatica: Secondary | ICD-10-CM

## 2020-11-08 DIAGNOSIS — I10 Essential (primary) hypertension: Secondary | ICD-10-CM

## 2020-11-08 LAB — LIPID PANEL
Cholesterol: 153 mg/dL (ref 0–200)
HDL: 61.9 mg/dL (ref >39.00)
LDL Cholesterol: 64 mg/dL (ref 0–99)
NonHDL: 91.25
Total CHOL/HDL Ratio: 2
Triglycerides: 135 mg/dL (ref 0.0–149.0)
VLDL: 27 mg/dL (ref 0.0–40.0)

## 2020-11-08 LAB — VITAMIN D 25 HYDROXY (VIT D DEFICIENCY, FRACTURES): VITD: 8.7 ng/mL — ABNORMAL LOW (ref 30.00–100.00)

## 2020-11-08 LAB — MAGNESIUM: Magnesium: 1.8 mg/dL (ref 1.5–2.5)

## 2020-11-08 MED ORDER — PANTOPRAZOLE SODIUM 40 MG PO TBEC: 40.0000 mg | DELAYED_RELEASE_TABLET | Freq: Every day | ORAL | 3 refills | Status: DC

## 2020-11-08 NOTE — Progress Notes (Signed)
Subjective:    Patient ID: Christine Middleton, female    DOB: 21-May-1923, 87 y.o.   MRN: [id number]  HPI Here with her daughter to follow up on issues. She is doing well except for some fatigue. She saw Hematology recently and they agreed she is a good candidate for IV iron infusions. She will be scheduled for 3 infusions one week apart. Her BMs are regular and there has been no recent bleeding. Her BP is stable. Her moods are stable.    Review of Systems  Constitutional: Positive for fatigue.  HENT: Negative.   Eyes: Negative.   Respiratory: Negative.   Cardiovascular: Negative.   Gastrointestinal: Negative.   Genitourinary: Negative for decreased urine volume, difficulty urinating, dyspareunia, dysuria, enuresis, flank pain, frequency, hematuria, pelvic pain and urgency.  Musculoskeletal: Negative.   Skin: Negative.   Neurological: Negative.   Psychiatric/Behavioral: Negative.        Objective:   Physical Exam Constitutional:      General: She is not in acute distress.    Appearance: She is well-developed.     Comments: Frail   HENT:     Head: Normocephalic and atraumatic.     Right Ear: External ear normal.     Left Ear: External ear normal.     Nose: Nose normal.     Mouth/Throat:     Pharynx: No oropharyngeal exudate.  Eyes:     General: No scleral icterus.    Conjunctiva/sclera: Conjunctivae normal.     Pupils: Pupils are equal, round, and reactive to light.  Neck:     Thyroid: No thyromegaly.     Vascular: No JVD.  Cardiovascular:     Rate and Rhythm: Normal rate and regular rhythm.     Heart sounds: Normal heart sounds. No murmur heard. No friction rub. No gallop.   Pulmonary:     Effort: Pulmonary effort is normal. No respiratory distress.     Breath sounds: Normal breath sounds. No wheezing or rales.  Chest:     Chest wall: No tenderness.  Abdominal:     General: Bowel sounds are normal. There is no distension.     Palpations: Abdomen is soft. There is no  mass.     Tenderness: There is no abdominal tenderness. There is no guarding or rebound.  Musculoskeletal:        General: No tenderness. Normal range of motion.     Cervical back: Normal range of motion and neck supple.  Lymphadenopathy:     Cervical: No cervical adenopathy.  Skin:    General: Skin is warm and dry.     Findings: No erythema or rash.  Neurological:     Mental Status: She is alert and oriented to person, place, and time.     Cranial Nerves: No cranial nerve deficit.     Motor: No abnormal muscle tone.     Coordination: Coordination normal.     Deep Tendon Reflexes: Reflexes are normal and symmetric. Reflexes normal.  Psychiatric:        Behavior: Behavior normal.        Thought Content: Thought content normal.        Judgment: Judgment normal.           Assessment & Plan:  Her hemorrhoidal bleeding has stopped for now. She will be scheduled to see Surgery soon to address the hemorrhoids. Her depression and anxiety are stable. Her HTN is stable. She will follow up with Hematology for the iron  deficiency anemia. We will get fasting labs today for lipids, magnesium, and vitamin D.  [name], MD

## 2020-11-08 NOTE — Telephone Encounter (Signed)
Called to inform of appts. spoke with family member who advised me to call back to speak with patient. Will call at later time

## 2020-11-09 DIAGNOSIS — M8580 Other specified disorders of bone density and structure, unspecified site: Secondary | ICD-10-CM | POA: Diagnosis not present

## 2020-11-09 DIAGNOSIS — E785 Hyperlipidemia, unspecified: Secondary | ICD-10-CM | POA: Diagnosis not present

## 2020-11-09 DIAGNOSIS — M4316 Spondylolisthesis, lumbar region: Secondary | ICD-10-CM | POA: Diagnosis not present

## 2020-11-09 DIAGNOSIS — M170 Bilateral primary osteoarthritis of knee: Secondary | ICD-10-CM | POA: Diagnosis not present

## 2020-11-09 DIAGNOSIS — M5137 Other intervertebral disc degeneration, lumbosacral region: Secondary | ICD-10-CM | POA: Diagnosis not present

## 2020-11-09 DIAGNOSIS — H9193 Unspecified hearing loss, bilateral: Secondary | ICD-10-CM | POA: Diagnosis not present

## 2020-11-09 DIAGNOSIS — M8008XD Age-related osteoporosis with current pathological fracture, vertebra(e), subsequent encounter for fracture with routine healing: Secondary | ICD-10-CM | POA: Diagnosis not present

## 2020-11-09 DIAGNOSIS — M160 Bilateral primary osteoarthritis of hip: Secondary | ICD-10-CM | POA: Diagnosis not present

## 2020-11-09 DIAGNOSIS — I480 Paroxysmal atrial fibrillation: Secondary | ICD-10-CM | POA: Diagnosis not present

## 2020-11-09 DIAGNOSIS — D62 Acute posthemorrhagic anemia: Secondary | ICD-10-CM | POA: Diagnosis not present

## 2020-11-09 DIAGNOSIS — D509 Iron deficiency anemia, unspecified: Secondary | ICD-10-CM | POA: Diagnosis not present

## 2020-11-09 DIAGNOSIS — I10 Essential (primary) hypertension: Secondary | ICD-10-CM | POA: Diagnosis not present

## 2020-11-09 DIAGNOSIS — K5730 Diverticulosis of large intestine without perforation or abscess without bleeding: Secondary | ICD-10-CM | POA: Diagnosis not present

## 2020-11-09 DIAGNOSIS — M48061 Spinal stenosis, lumbar region without neurogenic claudication: Secondary | ICD-10-CM | POA: Diagnosis not present

## 2020-11-09 DIAGNOSIS — K648 Other hemorrhoids: Secondary | ICD-10-CM | POA: Diagnosis not present

## 2020-11-09 DIAGNOSIS — M5136 Other intervertebral disc degeneration, lumbar region: Secondary | ICD-10-CM | POA: Diagnosis not present

## 2020-11-09 MED ORDER — VITAMIN D (ERGOCALCIFEROL) 1.25 MG (50000 UNIT) PO CAPS: 50000.0000 [IU] | ORAL_CAPSULE | ORAL | 3 refills | Status: DC

## 2020-11-09 NOTE — Addendum Note (Signed)
Addended by: Agnes Lawrence on: 11/09/2020 08:34 AM   Modules accepted: Orders

## 2020-11-10 NOTE — Telephone Encounter (Signed)
Received the order today placed on Dr Sarajane Jews folder for signature, Form will be faxed soon as completed

## 2020-11-11 DIAGNOSIS — M4316 Spondylolisthesis, lumbar region: Secondary | ICD-10-CM

## 2020-11-11 DIAGNOSIS — K219 Gastro-esophageal reflux disease without esophagitis: Secondary | ICD-10-CM

## 2020-11-11 DIAGNOSIS — K449 Diaphragmatic hernia without obstruction or gangrene: Secondary | ICD-10-CM

## 2020-11-11 DIAGNOSIS — K5730 Diverticulosis of large intestine without perforation or abscess without bleeding: Secondary | ICD-10-CM | POA: Diagnosis not present

## 2020-11-11 DIAGNOSIS — M160 Bilateral primary osteoarthritis of hip: Secondary | ICD-10-CM

## 2020-11-11 DIAGNOSIS — K648 Other hemorrhoids: Secondary | ICD-10-CM | POA: Diagnosis not present

## 2020-11-11 DIAGNOSIS — D509 Iron deficiency anemia, unspecified: Secondary | ICD-10-CM

## 2020-11-11 DIAGNOSIS — K222 Esophageal obstruction: Secondary | ICD-10-CM

## 2020-11-11 DIAGNOSIS — M8008XD Age-related osteoporosis with current pathological fracture, vertebra(e), subsequent encounter for fracture with routine healing: Secondary | ICD-10-CM | POA: Diagnosis not present

## 2020-11-11 DIAGNOSIS — E538 Deficiency of other specified B group vitamins: Secondary | ICD-10-CM

## 2020-11-11 DIAGNOSIS — Z8673 Personal history of transient ischemic attack (TIA), and cerebral infarction without residual deficits: Secondary | ICD-10-CM

## 2020-11-11 DIAGNOSIS — M8580 Other specified disorders of bone density and structure, unspecified site: Secondary | ICD-10-CM

## 2020-11-11 DIAGNOSIS — M5136 Other intervertebral disc degeneration, lumbar region: Secondary | ICD-10-CM

## 2020-11-11 DIAGNOSIS — M48061 Spinal stenosis, lumbar region without neurogenic claudication: Secondary | ICD-10-CM

## 2020-11-11 DIAGNOSIS — Z9181 History of falling: Secondary | ICD-10-CM

## 2020-11-11 DIAGNOSIS — I700 Atherosclerosis of aorta: Secondary | ICD-10-CM

## 2020-11-11 DIAGNOSIS — Z7901 Long term (current) use of anticoagulants: Secondary | ICD-10-CM

## 2020-11-11 DIAGNOSIS — M5137 Other intervertebral disc degeneration, lumbosacral region: Secondary | ICD-10-CM

## 2020-11-11 DIAGNOSIS — I480 Paroxysmal atrial fibrillation: Secondary | ICD-10-CM

## 2020-11-11 DIAGNOSIS — M170 Bilateral primary osteoarthritis of knee: Secondary | ICD-10-CM

## 2020-11-11 DIAGNOSIS — D62 Acute posthemorrhagic anemia: Secondary | ICD-10-CM

## 2020-11-11 DIAGNOSIS — I10 Essential (primary) hypertension: Secondary | ICD-10-CM | POA: Diagnosis not present

## 2020-11-11 DIAGNOSIS — H9193 Unspecified hearing loss, bilateral: Secondary | ICD-10-CM

## 2020-11-11 DIAGNOSIS — E785 Hyperlipidemia, unspecified: Secondary | ICD-10-CM

## 2020-11-12 ENCOUNTER — Inpatient Hospital Stay: Payer: Medicare Other

## 2020-11-12 ENCOUNTER — Other Ambulatory Visit: Payer: Self-pay

## 2020-11-12 VITALS — BP 140/75 | HR 86 | Temp 98.9°F | Resp 17

## 2020-11-12 DIAGNOSIS — I4891 Unspecified atrial fibrillation: Secondary | ICD-10-CM | POA: Diagnosis not present

## 2020-11-12 DIAGNOSIS — D500 Iron deficiency anemia secondary to blood loss (chronic): Secondary | ICD-10-CM

## 2020-11-12 DIAGNOSIS — I10 Essential (primary) hypertension: Secondary | ICD-10-CM | POA: Diagnosis not present

## 2020-11-12 DIAGNOSIS — E538 Deficiency of other specified B group vitamins: Secondary | ICD-10-CM | POA: Diagnosis not present

## 2020-11-12 DIAGNOSIS — K649 Unspecified hemorrhoids: Secondary | ICD-10-CM | POA: Diagnosis not present

## 2020-11-12 DIAGNOSIS — M810 Age-related osteoporosis without current pathological fracture: Secondary | ICD-10-CM | POA: Diagnosis not present

## 2020-11-12 DIAGNOSIS — K5792 Diverticulitis of intestine, part unspecified, without perforation or abscess without bleeding: Secondary | ICD-10-CM | POA: Diagnosis not present

## 2020-11-12 DIAGNOSIS — Z79899 Other long term (current) drug therapy: Secondary | ICD-10-CM | POA: Diagnosis not present

## 2020-11-12 DIAGNOSIS — D509 Iron deficiency anemia, unspecified: Secondary | ICD-10-CM | POA: Diagnosis not present

## 2020-11-12 DIAGNOSIS — Z8673 Personal history of transient ischemic attack (TIA), and cerebral infarction without residual deficits: Secondary | ICD-10-CM | POA: Diagnosis not present

## 2020-11-12 DIAGNOSIS — K219 Gastro-esophageal reflux disease without esophagitis: Secondary | ICD-10-CM | POA: Diagnosis not present

## 2020-11-12 DIAGNOSIS — E785 Hyperlipidemia, unspecified: Secondary | ICD-10-CM | POA: Diagnosis not present

## 2020-11-12 MED ORDER — SODIUM CHLORIDE 0.9 % IV SOLN
300.0000 mg | Freq: Once | INTRAVENOUS | Status: AC
  Administered 2020-11-12: 300 mg via INTRAVENOUS
  Filled 2020-11-12: qty 10

## 2020-11-12 MED ORDER — SODIUM CHLORIDE 0.9 % IV SOLN
Freq: Once | INTRAVENOUS | Status: AC
  Filled 2020-11-12: qty 250

## 2020-11-12 NOTE — Patient Instructions (Signed)

## 2020-11-19 ENCOUNTER — Other Ambulatory Visit: Payer: Self-pay

## 2020-11-19 ENCOUNTER — Inpatient Hospital Stay: Payer: Medicare Other

## 2020-11-19 VITALS — BP 143/73 | HR 78 | Temp 98.4°F | Resp 18

## 2020-11-19 DIAGNOSIS — Z79899 Other long term (current) drug therapy: Secondary | ICD-10-CM | POA: Diagnosis not present

## 2020-11-19 DIAGNOSIS — I4891 Unspecified atrial fibrillation: Secondary | ICD-10-CM | POA: Diagnosis not present

## 2020-11-19 DIAGNOSIS — I10 Essential (primary) hypertension: Secondary | ICD-10-CM | POA: Diagnosis not present

## 2020-11-19 DIAGNOSIS — M810 Age-related osteoporosis without current pathological fracture: Secondary | ICD-10-CM | POA: Diagnosis not present

## 2020-11-19 DIAGNOSIS — E785 Hyperlipidemia, unspecified: Secondary | ICD-10-CM | POA: Diagnosis not present

## 2020-11-19 DIAGNOSIS — E538 Deficiency of other specified B group vitamins: Secondary | ICD-10-CM | POA: Diagnosis not present

## 2020-11-19 DIAGNOSIS — K5792 Diverticulitis of intestine, part unspecified, without perforation or abscess without bleeding: Secondary | ICD-10-CM | POA: Diagnosis not present

## 2020-11-19 DIAGNOSIS — D509 Iron deficiency anemia, unspecified: Secondary | ICD-10-CM | POA: Diagnosis not present

## 2020-11-19 DIAGNOSIS — K219 Gastro-esophageal reflux disease without esophagitis: Secondary | ICD-10-CM | POA: Diagnosis not present

## 2020-11-19 DIAGNOSIS — K649 Unspecified hemorrhoids: Secondary | ICD-10-CM | POA: Diagnosis not present

## 2020-11-19 DIAGNOSIS — D500 Iron deficiency anemia secondary to blood loss (chronic): Secondary | ICD-10-CM

## 2020-11-19 DIAGNOSIS — Z8673 Personal history of transient ischemic attack (TIA), and cerebral infarction without residual deficits: Secondary | ICD-10-CM | POA: Diagnosis not present

## 2020-11-19 MED ORDER — SODIUM CHLORIDE 0.9 % IV SOLN
Freq: Once | INTRAVENOUS | Status: AC
  Filled 2020-11-19: qty 250

## 2020-11-19 MED ORDER — SODIUM CHLORIDE 0.9 % IV SOLN
300.0000 mg | Freq: Once | INTRAVENOUS | Status: AC
  Administered 2020-11-19: 300 mg via INTRAVENOUS
  Filled 2020-11-19: qty 300

## 2020-11-19 NOTE — Patient Instructions (Signed)

## 2020-11-22 ENCOUNTER — Telehealth: Payer: Self-pay

## 2020-11-22 DIAGNOSIS — K642 Third degree hemorrhoids: Secondary | ICD-10-CM | POA: Diagnosis not present

## 2020-11-22 NOTE — Progress Notes (Signed)
Chronic Care Management Pharmacy Assistant   Name: Christine Middleton  MRN: [id number] DOB: 29-Mar-1923   Reason for Encounter: Disease State/ Hypertension.   Conditions to be addressed/monitored: HTN  Recent office visits:  11/08/20 Christine Penna MD (PCP) - ergocalciferol 50000 units every 7 days added to patients medications. Her hemorrhoidal bleeding has stopped for now. She will be scheduled to see Surgery soon to address the hemorrhoids. Her depression and anxiety are stable. Her HTN is stable. She will follow up with Hematology for the iron deficiency anemia. We will get fasting labs today for lipids, magnesium, and vitamin D per Dr. Sarajane Jews.  10/28/20 Christine Middleton. Sarajane Jews MD (PCP) - discontinued magnesium 200mg , melatonin 5mg , tramadol HCL 100mg  and valacyclovir hcl 1000mg . Referrals placed for gastroenterology, hematology and general surgery. Patient seen for acute on chronic blood loss and anemia.   10/22/20 Christine Middleton. Sarajane Jews MD (PCP) - Added mirtazapine 15mg  daily at bedtime. Changed atenolol 50mg  from 2 tablets by mouth everyday to 1 tablet by mouth daily. Discontinued medications amlodipine besylate 5mg  and losartan potassium 100mg . Patient instructed to keep follow up appointment with chronic care management.  10/14/20 Christine Leonie Green MD (PCP) - seen for herpes zoster without complication and low blood pressure. Added valacyclovir hcl 1000mg  three times daily for 7 days. Discontinued amlodipine besylate 5mg . Patient instruced to check blood pressure at home and follow up with PCP.  09/14/20 Christine Middleton. Sarajane Jews MD (PCP) - Seen for compression fracture. Changed tramadol 50mg  1 tablet  every 8 hours as needed to 2 tablets 100mg  total by mouth every 6 hours as needed. Patient instructed to continue with PT and OT.  08/26/20 Christine Penna MD (PCP) - seen for hemorrhoids. Added hydrocortisone acetate 25mg  rectal 2 times daily. Discontinued docusate sodium 100mg  and ployvinyl alcohol 1.4 percent. Will  follow up as needed.    Recent consult visits:  11/01/20 Cira Rue NP (oncology) - no medications changes.Iron deficiency anemia, referred by PCP Dr. Alysia Middleton. Recommended IV iron Venofer 300 mg weekly for 3 doses, and will follow up her blood counts afterwards. Patient instructed to follow up in 6 months.  10/04/20 Theodoro Doing and Nicoletta Dress, Na Dawley DO [facility]. Patient seen for compression fracture of L1 vertebra with routine healing, subsequent encounter. No medication changes.  09/06/20 Theodoro Doing and Nicoletta Dress, Na Dawley DO [facility]. Follow up from hospital due to fall. Patient weaned off of tramadol and only taking tylenol intermittently for pain.     Hospital visits:   Medication Reconciliation was completed by comparing discharge summary, patient's EMR and Pharmacy list, and upon discussion with patient.  Admitted to the hospital on 10/22/20 due to GI Bleed and symptomatic anemia. Discharge date was 10/24/20. Discharged from [facility] [place]?Medications Started at [facility] Discharge:?? none  Medication Changes at Hospital Discharge: none  Medications Discontinued at Hospital Discharge: none  Medications that remain the same after Hospital Discharge:??  -All other medications will remain the same.    Medication Reconciliation was completed by comparing discharge summary, patient's EMR and Pharmacy list, and upon discussion with patient.  Admitted to the hospital on 09/04/20 due to fall. Discharge date was 09/06/20. Discharged from [place]?Medications Started at [facility] Discharge:?? none  Medication Changes at Hospital Discharge: none  Medications Discontinued at Hospital Discharge: none  Medications that remain the same after Hospital Discharge:??  -All other medications will  remain the same.         Medications: Outpatient Encounter Medications as of 11/22/2020  Medication  Sig Note  . acetaminophen (TYLENOL) 325 MG tablet Take 325 mg by mouth 2 (two) times daily.   Marland Kitchen atenolol (TENORMIN) 50 MG tablet Take 1 tablet (50 mg total) by mouth daily. (Patient taking differently: Take 100 mg by mouth daily.) 10/22/2020: Dose change 50 mg daily as of 10/23/20  . docusate sodium (COLACE) 100 MG capsule Take 2 capsules (200 mg total) by mouth 2 (two) times daily. (Patient taking differently: Take 100 mg by mouth daily. Taking twice daily)   . ELIQUIS 2.5 MG TABS tablet TAKE 1 TABLET BY MOUTH TWICE A DAY (Patient taking differently: Take 2.5 mg by mouth 2 (two) times daily.)   . ferrous sulfate 325 (65 FE) MG tablet Take 1 tablet (325 mg total) by mouth every other day.   . hydrocortisone (ANUSOL-HC) 25 MG suppository Place 1 suppository (25 mg total) rectally 2 (two) times daily. (Patient taking differently: Place 25 mg rectally 2 (two) times daily as needed for hemorrhoids.)   . lidocaine (LMX) 4 % cream Apply 1 application topically daily as needed (pain).   . methocarbamol (ROBAXIN) 500 MG tablet Take 1 tablet (500 mg total) by mouth every 8 (eight) hours as needed for muscle spasms.   . mirtazapine (REMERON) 15 MG tablet Take 0.5 tablets (7.5 mg total) by mouth at bedtime.   . pantoprazole (PROTONIX) 40 MG tablet Take 1 tablet (40 mg total) by mouth daily.   . polycarbophil (FIBERCON) 625 MG tablet Take 625 mg by mouth daily.   . polyethylene glycol (MIRALAX / GLYCOLAX) 17 g packet Take 17 g by mouth daily.   . simvastatin (ZOCOR) 20 MG tablet TAKE 1 TABLET BY MOUTH EVERYDAY AT BEDTIME (Patient taking differently: Take 20 mg by mouth every evening.)   . Vitamin D, Ergocalciferol, (DRISDOL) 1.25 MG (50000 UNIT) CAPS capsule Take 1 capsule (50,000 Units total) by mouth every 7 (seven) days.    No facility-administered encounter medications on file as of 11/22/2020.   Reviewed chart prior to disease state call. Spoke with patient regarding BP  Recent Office Vitals: BP Readings  from Last 3 Encounters:  11/19/20 (!) 143/73  11/12/20 140/75  11/08/20 118/76   Pulse Readings from Last 3 Encounters:  11/19/20 78  11/12/20 86  11/08/20 90    Wt Readings from Last 3 Encounters:  11/08/20 127 lb 12.8 oz (58 kg)  11/01/20 129 lb 6.4 oz (58.7 kg)  10/28/20 128 lb (58.1 kg)     Kidney Function Lab Results  Component Value Date/Time   CREATININE 0.86 10/23/2020 02:03 AM   CREATININE 0.95 10/22/2020 05:35 PM   GFR 52.66 (L) 10/22/2020 02:39 PM   GFRNONAA >60 10/23/2020 02:03 AM   GFRAA >60 07/11/2019 06:36 AM    BMP Latest Ref Rng & Units 10/23/2020 10/22/2020 10/22/2020  Glucose 70 - 99 mg/dL 115(H) 137(H) 135(H)  BUN 8 - 23 mg/dL 10 11 13   Creatinine 0.44 - 1.00 mg/dL 0.86 0.95 0.92  Sodium 135 - 145 mmol/L 137 137 139  Potassium 3.5 - 5.1 mmol/L 3.8 3.4(L) 3.7  Chloride 98 - 111 mmol/L 107 108 105  CO2 22 - 32 mmol/L 22 21(L) 24  Calcium 8.9 - 10.3 mg/dL 8.8(L) 8.7(L) 8.8    . Current antihypertensive regimen:  o Atenolol 50mg  take 1 tablet by mouth daily. . How often are you checking your Blood Pressure?  infrequently. Patient has been doing better since the decrease to her atenolol per daughter Christine Middleton and they have not felt the need to check regularly. . Current home BP readings: 144/95 on 11/22/20 and 132/90 recently.  . What recent interventions/DTPs have been made by any provider to improve Blood Pressure control since last CPP Visit: Patient instructed to be more vigilant at checking blood pressure at home. . Any recent hospitalizations or ED visits since last visit with CPP? Yes . What diet changes have been made to improve Blood Pressure Control?  o None. Patient does not have a normal appetite per Christine Middleton. . What exercise is being done to improve your Blood Pressure Control?  o Patient has been using a walker now which has helped her be more active such as, walking to the mailbox and making herself lunch. Patient likes to sew and crochet.    Adherence Review: Is the patient currently on ACE/ARB medication? No Does the patient have >5 day gap between last estimated fill dates? No   Stephannie Peters, CMA  Star Rating Drugs:  Simvastatin 20mg   - 11/13/20 90DS at [id number]   [place] [phone]

## 2020-11-26 ENCOUNTER — Inpatient Hospital Stay: Payer: Medicare Other

## 2020-11-26 ENCOUNTER — Other Ambulatory Visit: Payer: Self-pay

## 2020-11-26 VITALS — BP 162/85 | HR 78 | Temp 97.8°F | Resp 16

## 2020-11-26 DIAGNOSIS — K219 Gastro-esophageal reflux disease without esophagitis: Secondary | ICD-10-CM | POA: Diagnosis not present

## 2020-11-26 DIAGNOSIS — E785 Hyperlipidemia, unspecified: Secondary | ICD-10-CM | POA: Diagnosis not present

## 2020-11-26 DIAGNOSIS — E538 Deficiency of other specified B group vitamins: Secondary | ICD-10-CM | POA: Diagnosis not present

## 2020-11-26 DIAGNOSIS — M810 Age-related osteoporosis without current pathological fracture: Secondary | ICD-10-CM | POA: Diagnosis not present

## 2020-11-26 DIAGNOSIS — Z8673 Personal history of transient ischemic attack (TIA), and cerebral infarction without residual deficits: Secondary | ICD-10-CM | POA: Diagnosis not present

## 2020-11-26 DIAGNOSIS — K649 Unspecified hemorrhoids: Secondary | ICD-10-CM | POA: Diagnosis not present

## 2020-11-26 DIAGNOSIS — D500 Iron deficiency anemia secondary to blood loss (chronic): Secondary | ICD-10-CM

## 2020-11-26 DIAGNOSIS — I10 Essential (primary) hypertension: Secondary | ICD-10-CM | POA: Diagnosis not present

## 2020-11-26 DIAGNOSIS — Z79899 Other long term (current) drug therapy: Secondary | ICD-10-CM | POA: Diagnosis not present

## 2020-11-26 DIAGNOSIS — I4891 Unspecified atrial fibrillation: Secondary | ICD-10-CM | POA: Diagnosis not present

## 2020-11-26 DIAGNOSIS — D509 Iron deficiency anemia, unspecified: Secondary | ICD-10-CM | POA: Diagnosis not present

## 2020-11-26 DIAGNOSIS — K5792 Diverticulitis of intestine, part unspecified, without perforation or abscess without bleeding: Secondary | ICD-10-CM | POA: Diagnosis not present

## 2020-11-26 LAB — CBC WITH DIFFERENTIAL (CANCER CENTER ONLY)
Abs Immature Granulocytes: 0.02 10*3/uL (ref 0.00–0.07)
Basophils Absolute: 0.1 10*3/uL (ref 0.0–0.1)
Basophils Relative: 1 %
Eosinophils Absolute: 0.2 10*3/uL (ref 0.0–0.5)
Eosinophils Relative: 3 %
HCT: 41.2 % (ref 36.0–46.0)
Hemoglobin: 12.9 g/dL (ref 12.0–15.0)
Immature Granulocytes: 0 %
Lymphocytes Relative: 16 %
Lymphs Abs: 1.2 10*3/uL (ref 0.7–4.0)
MCH: 28.7 pg (ref 26.0–34.0)
MCHC: 31.3 g/dL (ref 30.0–36.0)
MCV: 91.6 fL (ref 80.0–100.0)
Monocytes Absolute: 0.7 10*3/uL (ref 0.1–1.0)
Monocytes Relative: 10 %
Neutro Abs: 5 10*3/uL (ref 1.7–7.7)
Neutrophils Relative %: 70 %
Platelet Count: 211 10*3/uL (ref 150–400)
RBC: 4.5 MIL/uL (ref 3.87–5.11)
RDW: 17.6 % — ABNORMAL HIGH (ref 11.5–15.5)
WBC Count: 7.1 10*3/uL (ref 4.0–10.5)
nRBC: 0 % (ref 0.0–0.2)

## 2020-11-26 LAB — CMP (CANCER CENTER ONLY)
ALT: 7 U/L (ref 0–44)
AST: 16 U/L (ref 15–41)
Albumin: 3.6 g/dL (ref 3.5–5.0)
Alkaline Phosphatase: 126 U/L (ref 38–126)
Anion gap: 11 (ref 5–15)
BUN: 11 mg/dL (ref 8–23)
CO2: 24 mmol/L (ref 22–32)
Calcium: 9.3 mg/dL (ref 8.9–10.3)
Chloride: 107 mmol/L (ref 98–111)
Creatinine: 0.83 mg/dL (ref 0.44–1.00)
GFR, Estimated: >60 mL/min (ref >60)
Glucose, Bld: 95 mg/dL (ref 70–99)
Potassium: 3.8 mmol/L (ref 3.5–5.1)
Sodium: 142 mmol/L (ref 135–145)
Total Bilirubin: 0.7 mg/dL (ref 0.3–1.2)
Total Protein: 7.4 g/dL (ref 6.5–8.1)

## 2020-11-26 LAB — IRON AND TIBC
Iron: 73 ug/dL (ref 41–142)
Saturation Ratios: 27 % (ref 21–57)
TIBC: 274 ug/dL (ref 236–444)
UIBC: 201 ug/dL (ref 120–384)

## 2020-11-26 LAB — FERRITIN: Ferritin: 312 ng/mL — ABNORMAL HIGH (ref 11–307)

## 2020-11-26 MED ORDER — IRON SUCROSE 20 MG/ML IV SOLN
300.0000 mg | Freq: Once | INTRAVENOUS | Status: AC
  Administered 2020-11-26: 300 mg via INTRAVENOUS
  Filled 2020-11-26: qty 300

## 2020-11-26 MED ORDER — SODIUM CHLORIDE 0.9 % IV SOLN
Freq: Once | INTRAVENOUS | Status: AC
  Filled 2020-11-26: qty 250

## 2020-11-26 NOTE — Progress Notes (Signed)
Patient was observed for 30 minutes post infusion with no complaints. Vitals stable and patient in no distress upon leaving infusion room.

## 2020-11-26 NOTE — Patient Instructions (Signed)

## 2020-11-29 ENCOUNTER — Telehealth: Payer: Self-pay

## 2020-11-29 NOTE — Telephone Encounter (Signed)
-----   Message from Alla Feeling, NP sent at 11/26/2020  3:17 PM EDT ----- Please let her know she responded very well to IV iron, IDA resolved. Remind her of lab on 7/11, we will notify with results and arrange additional iron if needed. She can continue oral iron every other day (with stool softener) if she is tolerating.   Thanks, Regan Rakers, NP

## 2020-11-29 NOTE — Telephone Encounter (Signed)
Called pt made her  Son aware of most recent labs and PO Iron e/o day QD son states he understands  Encouraged to call for any questions concerns or changes

## 2020-11-30 ENCOUNTER — Encounter: Payer: Self-pay | Admitting: Nurse Practitioner

## 2020-12-21 DIAGNOSIS — K642 Third degree hemorrhoids: Secondary | ICD-10-CM | POA: Diagnosis not present

## 2020-12-30 ENCOUNTER — Telehealth: Payer: Self-pay | Admitting: Family Medicine

## 2020-12-30 NOTE — Telephone Encounter (Signed)
Left message for patient to call back and schedule Medicare Annual Wellness Visit (AWV) either virtually or in office.   AWV-I per [place] 07/31/09 please schedule at anytime with [facility] Nurse Health Advisor 1 or 2   This should be a 45 minute visit. 

## 2020-12-30 NOTE — Telephone Encounter (Signed)
Daughter called and stated that she will call back after speaking with the pt.

## 2021-01-17 ENCOUNTER — Encounter (HOSPITAL_COMMUNITY): Payer: Self-pay | Admitting: Emergency Medicine

## 2021-01-17 ENCOUNTER — Emergency Department (HOSPITAL_COMMUNITY): Payer: Medicare Other

## 2021-01-17 ENCOUNTER — Emergency Department (HOSPITAL_COMMUNITY)
Admission: EM | Admit: 2021-01-17 | Discharge: 2021-01-18 | Disposition: A | Discharge status: Home / Self Care | Payer: Medicare Other | Attending: Emergency Medicine | Admitting: Emergency Medicine

## 2021-01-17 ENCOUNTER — Other Ambulatory Visit: Payer: Self-pay

## 2021-01-17 ENCOUNTER — Telehealth: Payer: Self-pay

## 2021-01-17 DIAGNOSIS — R4701 Aphasia: Secondary | ICD-10-CM | POA: Insufficient documentation

## 2021-01-17 DIAGNOSIS — R4781 Slurred speech: Secondary | ICD-10-CM

## 2021-01-17 DIAGNOSIS — R569 Unspecified convulsions: Secondary | ICD-10-CM | POA: Diagnosis not present

## 2021-01-17 DIAGNOSIS — Z7901 Long term (current) use of anticoagulants: Secondary | ICD-10-CM | POA: Diagnosis not present

## 2021-01-17 DIAGNOSIS — Z79899 Other long term (current) drug therapy: Secondary | ICD-10-CM | POA: Diagnosis not present

## 2021-01-17 DIAGNOSIS — R457 State of emotional shock and stress, unspecified: Secondary | ICD-10-CM | POA: Diagnosis not present

## 2021-01-17 DIAGNOSIS — J019 Acute sinusitis, unspecified: Secondary | ICD-10-CM | POA: Diagnosis not present

## 2021-01-17 DIAGNOSIS — G319 Degenerative disease of nervous system, unspecified: Secondary | ICD-10-CM | POA: Diagnosis not present

## 2021-01-17 DIAGNOSIS — I10 Essential (primary) hypertension: Secondary | ICD-10-CM | POA: Diagnosis not present

## 2021-01-17 DIAGNOSIS — R531 Weakness: Secondary | ICD-10-CM | POA: Diagnosis not present

## 2021-01-17 LAB — AMMONIA: Ammonia: 15 umol/L (ref 9–35)

## 2021-01-17 LAB — URINALYSIS, ROUTINE W REFLEX MICROSCOPIC
Bilirubin Urine: NEGATIVE
Glucose, UA: NEGATIVE mg/dL
Hgb urine dipstick: NEGATIVE
Ketones, ur: NEGATIVE mg/dL
Nitrite: NEGATIVE
Protein, ur: NEGATIVE mg/dL
Specific Gravity, Urine: 1.006 (ref 1.005–1.030)
pH: 7 (ref 5.0–8.0)

## 2021-01-17 LAB — COMPREHENSIVE METABOLIC PANEL
ALT: 14 U/L (ref 0–44)
AST: 20 U/L (ref 15–41)
Albumin: 3.7 g/dL (ref 3.5–5.0)
Alkaline Phosphatase: 89 U/L (ref 38–126)
Anion gap: 10 (ref 5–15)
BUN: 11 mg/dL (ref 8–23)
CO2: 27 mmol/L (ref 22–32)
Calcium: 9.2 mg/dL (ref 8.9–10.3)
Chloride: 102 mmol/L (ref 98–111)
Creatinine, Ser: 0.72 mg/dL (ref 0.44–1.00)
GFR, Estimated: >60 mL/min (ref >60)
Glucose, Bld: 106 mg/dL — ABNORMAL HIGH (ref 70–99)
Potassium: 3.8 mmol/L (ref 3.5–5.1)
Sodium: 139 mmol/L (ref 135–145)
Total Bilirubin: 0.9 mg/dL (ref 0.3–1.2)
Total Protein: 6.8 g/dL (ref 6.5–8.1)

## 2021-01-17 LAB — CBC WITH DIFFERENTIAL/PLATELET
Abs Immature Granulocytes: 0.02 10*3/uL (ref 0.00–0.07)
Basophils Absolute: 0.1 10*3/uL (ref 0.0–0.1)
Basophils Relative: 1 %
Eosinophils Absolute: 0.3 10*3/uL (ref 0.0–0.5)
Eosinophils Relative: 5 %
HCT: 41.5 % (ref 36.0–46.0)
Hemoglobin: 13.3 g/dL (ref 12.0–15.0)
Immature Granulocytes: 0 %
Lymphocytes Relative: 19 %
Lymphs Abs: 1.2 10*3/uL (ref 0.7–4.0)
MCH: 29.8 pg (ref 26.0–34.0)
MCHC: 32 g/dL (ref 30.0–36.0)
MCV: 93 fL (ref 80.0–100.0)
Monocytes Absolute: 0.8 10*3/uL (ref 0.1–1.0)
Monocytes Relative: 12 %
Neutro Abs: 4.1 10*3/uL (ref 1.7–7.7)
Neutrophils Relative %: 63 %
Platelets: 226 10*3/uL (ref 150–400)
RBC: 4.46 MIL/uL (ref 3.87–5.11)
RDW: 15.3 % (ref 11.5–15.5)
WBC: 6.5 10*3/uL (ref 4.0–10.5)
nRBC: 0 % (ref 0.0–0.2)

## 2021-01-17 LAB — PROTIME-INR
INR: 1.1 (ref 0.8–1.2)
Prothrombin Time: 14.3 seconds (ref 11.4–15.2)

## 2021-01-17 LAB — TROPONIN I (HIGH SENSITIVITY): Troponin I (High Sensitivity): 11 ng/L (ref <18)

## 2021-01-17 NOTE — ED Provider Notes (Signed)
Emergency Medicine Provider Triage Evaluation Note  Christine Middleton , a 87 y.o. female  was evaluated in triage.  Pt complains of slurred speech.  She was on the phone with her son at 87 and she started having slurred speech. EMS arrived at 1750 and at that point she did not have any slurred speech and felt back to normal.  She has had prior CVAs.  She is anticoagulated with Eliquis. Chart review shows that there are notes between patient's family and patient's PCP that apparently recently patient has been having delusions and confusion..  Review of Systems  Positive: Slurred speech that has resolved Negative: Fevers, chills  Physical Exam  BP (!) 166/102 (BP Location: Right Arm)   Pulse 95   Temp 97.7 F (36.5 C) (Oral)   Resp 16   Ht 5\' 2"  (1.575 m)   Wt 54.4 kg   SpO2 99%   BMI 21.95 kg/m  Gen:   Awake, no distress   Resp:  Normal effort  MSK:   Moves extremities without difficulty  Other:  Mild is symmetric.  Speech is not slurred.  Facial movements are symmetric.  Pupils equal round.  She is able to follow finger without nystagmus or difficulty.  Medical Decision Making  Medically screening exam initiated at 7:09 PM.  Appropriate orders placed.  Christine Middleton was informed that the remainder of the evaluation will be completed by another provider, this initial triage assessment does not replace that evaluation, and the importance of remaining in the ED until their evaluation is complete.  Patient is a 87 year old woman who presents today for evaluation of slurred speech.  She has reportedly returned to her baseline at this time. There were notes between patient's family and PCP and they felt like that the mirtazapine she was started on may be making her have delusions. Patient is anticoagulated with Eliquis, does not appear to have any neurologic deficits at this time.  Therefore code stroke was not called.  Will obtain labs, UA and CT scan of head.   Lorin Glass,  PA-C 01/17/21 1911    Daleen Bo, MD 01/18/21 0020

## 2021-01-17 NOTE — ED Triage Notes (Signed)
Per ems, pt from home. Pt reports slurred speech started at 1700 while on the phone with her son. Hx of CVAs. Slurred speech resolved by ems arrival at 1750. No other deficits noted at this time. No stroke deficits from past CVAs. Pt reports it feels like her speech is back to normal. EMS BP 166/100, HR 84 in AFIb, R 16, SpO2 100%, CBG 117.

## 2021-01-17 NOTE — Telephone Encounter (Signed)
Spoke with pt advised to go to the ER per Dr Sarajane Jews, pt state that her BP has come down to 138/87 and that her daughter has scheduled appointment for her to see Dr Sarajane Jews tomorrow at 1.30 pm, pt declined the ER visit, pt provided the daughters number which I called and spoke with daughter, state that pt has had the confusion for  a few weeks which seems like this is a result of taking Mirtazapine at bedtime. State that she would like to discuss how pt can wean off the medication at the visit tomorrow. Please advise

## 2021-01-17 NOTE — Telephone Encounter (Signed)
I agree, she should go to the ER  

## 2021-01-18 ENCOUNTER — Ambulatory Visit: Payer: Medicare Other | Admitting: Family Medicine

## 2021-01-18 ENCOUNTER — Emergency Department (HOSPITAL_COMMUNITY): Payer: Medicare Other

## 2021-01-18 ENCOUNTER — Other Ambulatory Visit: Payer: Self-pay | Admitting: Family Medicine

## 2021-01-18 DIAGNOSIS — R4781 Slurred speech: Secondary | ICD-10-CM | POA: Diagnosis not present

## 2021-01-18 DIAGNOSIS — J019 Acute sinusitis, unspecified: Secondary | ICD-10-CM | POA: Diagnosis not present

## 2021-01-18 DIAGNOSIS — R569 Unspecified convulsions: Secondary | ICD-10-CM

## 2021-01-18 DIAGNOSIS — D329 Benign neoplasm of meninges, unspecified: Secondary | ICD-10-CM

## 2021-01-18 DIAGNOSIS — G319 Degenerative disease of nervous system, unspecified: Secondary | ICD-10-CM | POA: Diagnosis not present

## 2021-01-18 LAB — TROPONIN I (HIGH SENSITIVITY): Troponin I (High Sensitivity): 11 ng/L (ref <18)

## 2021-01-18 MED ORDER — LEVETIRACETAM 500 MG PO TABS
500.0000 mg | ORAL_TABLET | Freq: Two times a day (BID) | ORAL | Status: DC
  Administered 2021-01-18: 500 mg via ORAL
  Filled 2021-01-18: qty 1

## 2021-01-18 MED ORDER — LEVETIRACETAM 500 MG PO TABS: 500.0000 mg | ORAL_TABLET | Freq: Two times a day (BID) | ORAL | 0 refills | Status: DC

## 2021-01-18 NOTE — Procedures (Addendum)
Patient Name: STEFFI NOVIELLO  MRN: [id number]  Epilepsy Attending: Lora Havens  Referring Physician/Provider: Dr Donnetta Simpers Date: 01/18/2021 Duration: 24.35 mins  Patient history: 87 y.o. female with PMH significant for GERD, prior stroke, Afibb on eliquis, meningioma who presents with several episodes of garbled speech lasting seconds to minutes with no loss of consciousness. EEG to evaluate for seizure  Level of alertness: Awake, asleep  AEDs during EEG study: None  Technical aspects: This EEG study was done with scalp electrodes positioned according to the 10-20 International system of electrode placement. Electrical activity was acquired at a sampling rate of 500Hz  and reviewed with a high frequency filter of 70Hz  and a low frequency filter of 1Hz . EEG data were recorded continuously and digitally stored.   Description: The posterior dominant rhythm consists of 8 Hz activity of moderate voltage (25-35 uV) seen predominantly in posterior head regions, symmetric and reactive to eye opening and eye closing. Sleep was characterized by vertex waves, sleep spindles (12 to 14 Hz), maximal frontocentral region. EEG showed intermittent generalized 3 to 6 Hz theta-delta slowing. Hyperventilation and photic stimulation were not performed.     ABNORMALITY - Intermittent slow, generalized  IMPRESSION: This study is suggestive of mild diffuse encephalopathy, nonspecific etiology. No seizures or epileptiform discharges were seen throughout the recording.   Dannia Snook Barbra Sarks

## 2021-01-18 NOTE — Discharge Instructions (Addendum)
Dr. Alferd Patee - our Neurologist has asked you to follow up with the brain specialist to discuss the small area on your brain that is being pushed and causing the speech problems. He thinks that this may be related to having small seizures - so he wants you to start Keppra twice a day. ER for worsening symptoms.  Thank you for letting us take care of you today!  Please obtain all of your results from medical records or have your doctors office obtain the results - share them with your doctor - you should be seen at your doctors office in the next 2 days. Call today to arrange your follow up. Take the medications as prescribed. Please review all of the medicines and only take them if you do not have an allergy to them. Please be aware that if you are taking birth control pills, taking other prescriptions, ESPECIALLY ANTIBIOTICS may make the birth control ineffective - if this is the case, either do not engage in sexual activity or use alternative methods of birth control such as condoms until you have finished the medicine and your family doctor says it is OK to restart them. If you are on a blood thinner such as COUMADIN, be aware that any other medicine that you take may cause the coumadin to either work too much, or not enough - you should have your coumadin level rechecked in next 7 days if this is the case.  ?  It is also a possibility that you have an allergic reaction to any of the medicines that you have been prescribed - Everybody reacts differently to medications and while MOST people have no trouble with most medicines, you may have a reaction such as nausea, vomiting, rash, swelling, shortness of breath. If this is the case, please stop taking the medicine immediately and contact your physician.   If you were given a medication in the ED such as percocet, vicodin, or morphine, be aware that these medicines are sedating and may change your ability to take care of yourself adequately for several hours  after being given this medicines - you should not drive or take care of small children if you were given this medicine in the Emergency Department or if you have been prescribed these types of medicines. ?   You should return to the ER IMMEDIATELY if you develop severe or worsening symptoms.

## 2021-01-18 NOTE — Progress Notes (Signed)
EEG complete - results pending 

## 2021-01-18 NOTE — Telephone Encounter (Signed)
That sounds good

## 2021-01-18 NOTE — Consult Note (Addendum)
NEUROLOGY CONSULTATION NOTE   Date of service: January 18, 2021 Patient Name: Christine Middleton MRN:  [id number] DOB:  12/04/1922 Reason for consult: "Episodes of garbled speech" Requesting Provider: Noemi Chapel, MD _ _ _   _ __   _ __ _ _  __ __   _ __   __ _  History of Present Illness  Christine Middleton is a 87 y.o. female with PMH significant for GERD, prior stroke, Afibb on eliquis, meningioma who presents with several episodes of garbled speech.   Started after fall in feb/march. Fell on her butt but her head did hit the kitchen cabinets and the cabinets were crooked afterwards. Patient is not very aware of the frequency of these episodes. Son reports that these have been happening atleast2-3 times a week. She would be talking and suddenly it would be word salad and it will get better in several seconds.  No EtOH use, no prior hx of significant head injury with loss of consciousness. No hx of CNS surgery or infections. She has had prior embolic appearing infarcts along with a left hippocampus infarct an small left parietal lobe meningioma.   ROS   Constitutional Denies weight loss, fever and chills.   HEENT Denies changes in vision and hearing.   Respiratory Denies SOB and cough.   CV Denies palpitations and CP   GI Denies abdominal pain, nausea, vomiting and diarrhea.   GU Denies dysuria and urinary frequency.   MSK Denies myalgia and joint pain.   Skin Denies rash and pruritus.   Neurological Denies headache and syncope.   Psychiatric Denies recent changes in mood. Denies anxiety and depression.    Past History   Past Medical History:  Diagnosis Date   Diverticulitis    Esophageal stricture    GERD (gastroesophageal reflux disease)    Hyperlipidemia    Hypertension    Shingles    Stroke ([place])    Vitamin B 12 deficiency    Past Surgical History:  Procedure Laterality Date   ABDOMINAL HYSTERECTOMY     APPENDECTOMY     COLONOSCOPY  12-09-08   per Dr. Sharlett Iles (incomplete  along with barium enema 12-10-08) with hemorrhoids  and diverticulae   COLONOSCOPY WITH PROPOFOL N/A 07/10/2019   Procedure: COLONOSCOPY WITH PROPOFOL;  Surgeon: Irving Copas., MD;  Location: [facility];  Service: Gastroenterology;  Laterality: N/A;   ESOPHAGOGASTRODUODENOSCOPY  09-2004   per Dr. Sharlett Iles with dilatation    EYE SURGERY     HEMOSTASIS CLIP PLACEMENT  07/10/2019   Procedure: HEMOSTASIS CLIP PLACEMENT;  Surgeon: Irving Copas., MD;  Location: [facility];  Service: Gastroenterology;;   POLYPECTOMY  07/10/2019   Procedure: POLYPECTOMY;  Surgeon: Irving Copas., MD;  Location: [facility];  Service: Gastroenterology;;   TONSILLECTOMY     Family History  Problem Relation Age of Onset   Heart attack Mother    Heart disease Father    Breast cancer Other    Diabetes Other    Hypertension Other    Stroke Other    Heart disease Other    Stroke Sister    Cancer Brother    Cancer Brother    Diabetes Brother    Social History   Socioeconomic History   Marital status: Widowed    Spouse name: Grayling Congress. Vantrease   Number of children: 4   Years of education: HS   Highest education level: Not on file  Occupational History   Occupation: Retired  Employer: RETIRED  Tobacco Use   Smoking status: Never   Smokeless tobacco: Never  Vaping Use   Vaping Use: Never used  Substance and Sexual Activity   Alcohol use: No    Alcohol/week: 0.0 standard drinks   Drug use: No   Sexual activity: Not on file  Other Topics Concern   Not on file  Social History Narrative   Patient lives at home with family.   Patient is right handed.   Patient has a high school education.   Caffeine Use: 1 cup daily   Social Determinants of Health   Financial Resource Strain: Low Risk    Difficulty of Paying Living Expenses: Not hard at all  Food Insecurity: Not on file  Transportation Needs: No Transportation Needs   Lack of Transportation (Medical): No   Lack of  Transportation (Non-Medical): No  Physical Activity: Not on file  Stress: Not on file  Social Connections: Not on file   Allergies  Allergen Reactions   Amoxicillin     REACTION: rash   Codeine    Lipitor [Atorvastatin]     Muscle pain, stiff joints   Lisinopril    Nitrofurantoin    Tramadol Anxiety    Sedation, worsening depression.     Medications  (Not in a hospital admission)    Vitals   Vitals:   01/17/21 2231 01/18/21 0110 01/18/21 0452 01/18/21 0830  BP: (!) 166/84 (!) 155/99 (!) 157/86 134/66  Pulse: 75 89 83 82  Resp: 17 17 15 18   Temp:  98.2 F (36.8 C)    TempSrc:      SpO2: 99% 98% 97% 98%  Weight:      Height:         Body mass index is 21.95 kg/m.  Physical Exam   General: Laying comfortably in bed; in no acute distress.  HENT: Normal oropharynx and mucosa. Normal external appearance of ears and nose.  Neck: Supple, no pain or tenderness  CV: No JVD. No peripheral edema. Pulmonary: Symmetric Chest rise. Normal respiratory effort.  Abdomen: Soft to touch, non-tender.  Ext: No cyanosis, edema, or deformity  Skin: No rash. Normal palpation of skin.   Musculoskeletal: Normal digits and nails by inspection. No clubbing.   Neurologic Examination  Mental status/Cognition: Alert, oriented to self, place, month and year, good attention.  Speech/language: Fluent, comprehension intact, object naming intact, repetition intact. Cranial nerves:   CN II Pupils equal and reactive to light, no VF deficits    CN III,IV,VI EOM intact, no gaze preference or deviation, no nystagmus    CN V normal sensation in V1, V2, and V3 segments bilaterally    CN VII no asymmetry, no nasolabial fold flattening    CN VIII normal hearing to speech    CN IX & X normal palatal elevation, no uvular deviation    CN XI 5/5 head turn and 5/5 shoulder shrug bilaterally    CN XII midline tongue protrusion    Motor:  Muscle bulk: poor, tone normal, pronator drift none tremor  none Mvmt Root Nerve  Muscle Right Left Comments  SA C5/6 Ax Deltoid     EF C5/6 Mc Biceps 5 5   EE C6/7/8 Rad Triceps 5 5   WF C6/7 Med FCR     WE C7/8 PIN ECU     F Ab C8/T1 U ADM/FDI 5 5   HF L1/2/3 Fem Illopsoas 5 5   KE L2/3/4 Fem Quad 5 5   DF  L4/5 D Peron Tib Ant 5 5   PF S1/2 Tibial Grc/Sol 5 5    Reflexes:  Right Left Comments  Pectoralis      Biceps (C5/6) 2 2   Brachioradialis (C5/6) 2 2    Triceps (C6/7) 2 2    Patellar (L3/4) 2 2    Achilles (S1)      Hoffman      Plantar     Jaw jerk    Sensation:  Light touch intact   Pin prick    Temperature    Vibration   Proprioception    Coordination/Complex Motor:  - Finger to Nose intact BL - Heel to shin intact BL - Rapid alternating movement are slowed - Gait: deferred.  Labs   CBC:  Recent Labs  Lab 01/17/21 1935  WBC 6.5  NEUTROABS 4.1  HGB 13.3  HCT 41.5  MCV 93.0  PLT 741    Basic Metabolic Panel:  Lab Results  Component Value Date   NA 139 01/17/2021   K 3.8 01/17/2021   CO2 27 01/17/2021   GLUCOSE 106 (H) 01/17/2021   BUN 11 01/17/2021   CREATININE 0.72 01/17/2021   CALCIUM 9.2 01/17/2021   GFRNONAA >60 01/17/2021   GFRAA >60 07/11/2019   Lipid Panel:  Lab Results  Component Value Date   LDLCALC 64 11/08/2020   HgbA1c:  Lab Results  Component Value Date   HGBA1C 6.1 10/22/2020   Urine Drug Screen:     Component Value Date/Time   LABOPIA NONE DETECTED 10/04/2013 2030   COCAINSCRNUR NONE DETECTED 10/04/2013 2030   LABBENZ NONE DETECTED 10/04/2013 2030   AMPHETMU NONE DETECTED 10/04/2013 2030   THCU NONE DETECTED 10/04/2013 2030   LABBARB NONE DETECTED 10/04/2013 2030    Alcohol Level     Component Value Date/Time   ETH <11 10/04/2013 0818    CT Head without contrast: 1. No acute intracranial abnormalities. 2. Old infarcts and mild chronic microvascular ischemic change. 3. Small left posterior frontal region meningioma.  MRI Brain: pending  rEEG:   pending  Impression   Christine Middleton is a 87 y.o. female with PMH significant for GERD, prior stroke, Afibb on eliquis, meningioma who presents with several episodes of garbled speech lasting seconds to minutes with no loss of consciousness. Her neurologic examination is notable for no focal deficit.  Episodes seem stereotyped on history and could potentially be focal seizures. Does have known small embolic stokes involving Left hippocampus and a known L parietal meningioma.  Primary Diagnosis:  Possible focal seizures  Recommendations  - MRI Brain without contrast - rEEG - Hold off on AEDs. Will revisit discussion regarding need for AEDs pending workup with MRI and rEEG. ______________________________________________________________________   Thank you for the opportunity to take part in the care of this patient. If you have any further questions, please contact the neurology consultation attending.  Signed,  [facility] [employer] Pager Number [phone] _ _ _   _ __   _ __ _ _  __ __   _ __   __ _

## 2021-01-18 NOTE — ED Provider Notes (Signed)
[facility] EMERGENCY DEPARTMENT Provider Note   CSN: [id number] Arrival date & time: 01/17/21  1828     History Chief Complaint  Patient presents with   Aphasia    Christine Middleton is a 87 y.o. female.  HPI  This patient is a 86 year old female, she has a history of 2 prior strokes and is currently taking Eliquis.  The son who accompanies her to the hospital today reports that while he was talking to her on the phone yesterday she developed acute onset of difficulty speaking.  He actually reports that she has had the same symptom multiple times in the past in fact he reports that this happens several times a week but yesterday the slurred speech was so intense that she could not get a word out and it seemed like she was speaking in 1 long run on symptoms.  She was seen by family members and did not have any weakness or numbness, note she did have some facial droop which she felt like that resolved as well and the son states that by the end of the conversation she was symptom-free.  She has been in the emergency department for the better part of 12 or more hours and has not had recurrence of symptoms.  The son reports that she actually had a fall within the last few months and had a fractured vertebrae in her back, she has been having some failure to thrive since that time.  Past Medical History:  Diagnosis Date   Diverticulitis    Esophageal stricture    GERD (gastroesophageal reflux disease)    Hyperlipidemia    Hypertension    Shingles    Stroke [facility])    Vitamin B 12 deficiency     Patient Active Problem List   Diagnosis Date Noted   Iron deficiency anemia 10/28/2020   Symptomatic anemia 10/22/2020   Shingles 10/22/2020   Closed compression fracture of body of L1 vertebra ([place]) 09/04/2020   Fall at home, initial encounter 09/04/2020   Acute on chronic blood loss anemia 09/04/2020   Rectal bleeding 07/08/2019   Depression with anxiety 09/26/2017   Pedal  edema 10/18/2016   TMJ (dislocation of temporomandibular joint) 09/18/2016   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   Cerebrovascular accident (CVA) ([place])    Embolic stroke ([place]) 76/73/4193   Facial droop 08/18/2016   HYPERGLYCEMIA 06/30/2010   Cerebral artery occlusion with cerebral infarction ([place]) 07/19/2009   Disorder resulting from impaired renal function 07/19/2009   Polymyalgia rheumatica ([place]) 07/19/2009   B12 deficiency 06/30/2009   Anxiety state 06/28/2009   Hereditary and idiopathic peripheral neuropathy 06/28/2009   External hemorrhoids 12/03/2008   ESOPHAGEAL STRICTURE 11/30/2008   Diverticulosis of colon 11/30/2008   GROSS HEMATURIA 11/04/2008   MYALGIA 11/04/2008   Constipation 06/24/2008   HLD (hyperlipidemia) 05/30/2007   SKIN LESION 05/30/2007   Essential hypertension 03/19/2007   GERD 03/19/2007    Past Surgical History:  Procedure Laterality Date   ABDOMINAL HYSTERECTOMY     APPENDECTOMY     COLONOSCOPY  12-09-08   per Dr. Sharlett Iles (incomplete along with barium enema 12-10-08) with hemorrhoids  and diverticulae   COLONOSCOPY WITH PROPOFOL N/A 07/10/2019   Procedure: COLONOSCOPY WITH PROPOFOL;  Surgeon: Irving Copas., MD;  Location: [facility];  Service: Gastroenterology;  Laterality: N/A;   ESOPHAGOGASTRODUODENOSCOPY  09-2004   per Dr. Sharlett Iles with dilatation    EYE SURGERY     HEMOSTASIS CLIP PLACEMENT  07/10/2019   Procedure: HEMOSTASIS  CLIP PLACEMENT;  Surgeon: Mansouraty, Telford Nab., MD;  Location: [facility];  Service: Gastroenterology;;   POLYPECTOMY  07/10/2019   Procedure: POLYPECTOMY;  Surgeon: Irving Copas., MD;  Location: [facility];  Service: Gastroenterology;;   TONSILLECTOMY       OB History   No obstetric history on file.     Family History  Problem Relation Age of Onset   Heart attack Mother    Heart disease Father    Breast cancer Other    Diabetes Other    Hypertension Other    Stroke Other    Heart disease  Other    Stroke Sister    Cancer Brother    Cancer Brother    Diabetes Brother     Social History   Tobacco Use   Smoking status: Never   Smokeless tobacco: Never  Vaping Use   Vaping Use: Never used  Substance Use Topics   Alcohol use: No    Alcohol/week: 0.0 standard drinks   Drug use: No    Home Medications Prior to Admission medications   Medication Sig Start Date End Date Taking? Authorizing Provider  levETIRAcetam (KEPPRA) 500 MG tablet Take 1 tablet (500 mg total) by mouth 2 (two) times daily. 01/18/21 02/17/21 Yes Noemi Chapel, MD  acetaminophen (TYLENOL) 325 MG tablet Take 325 mg by mouth 2 (two) times daily.    [provider]  atenolol (TENORMIN) 50 MG tablet Take 1 tablet (50 mg total) by mouth daily. Patient taking differently: Take 100 mg by mouth daily. 10/22/20   Laurey Morale, MD  docusate sodium (COLACE) 100 MG capsule Take 2 capsules (200 mg total) by mouth 2 (two) times daily. Patient taking differently: Take 100 mg by mouth daily. Taking twice daily 10/24/20   Lavina Hamman, MD  ELIQUIS 2.5 MG TABS tablet TAKE 1 TABLET BY MOUTH TWICE A DAY Patient taking differently: Take 2.5 mg by mouth 2 (two) times daily. 09/20/20   [place], Peter M, MD  ferrous sulfate 325 (65 FE) MG tablet Take 1 tablet (325 mg total) by mouth every other day. 10/26/20   Lavina Hamman, MD  hydrocortisone (ANUSOL-HC) 25 MG suppository Place 1 suppository (25 mg total) rectally 2 (two) times daily. Patient taking differently: Place 25 mg rectally 2 (two) times daily as needed for hemorrhoids. 08/26/20   Laurey Morale, MD  lidocaine (LMX) 4 % cream Apply 1 application topically daily as needed (pain).    [provider]  methocarbamol (ROBAXIN) 500 MG tablet Take 1 tablet (500 mg total) by mouth every 8 (eight) hours as needed for muscle spasms. 10/24/20   Lavina Hamman, MD  mirtazapine (REMERON) 15 MG tablet Take 0.5 tablets (7.5 mg total) by mouth at bedtime. 10/24/20    Lavina Hamman, MD  pantoprazole (PROTONIX) 40 MG tablet Take 1 tablet (40 mg total) by mouth daily. 11/08/20   Laurey Morale, MD  polycarbophil (FIBERCON) 625 MG tablet Take 625 mg by mouth daily.    [provider]  polyethylene glycol (MIRALAX / GLYCOLAX) 17 g packet Take 17 g by mouth daily. 07/12/19   Harold Hedge, MD  simvastatin (ZOCOR) 20 MG tablet TAKE 1 TABLET BY MOUTH EVERYDAY AT BEDTIME Patient taking differently: Take 20 mg by mouth every evening. 08/17/20   Laurey Morale, MD  Vitamin D, Ergocalciferol, (DRISDOL) 1.25 MG (50000 UNIT) CAPS capsule Take 1 capsule (50,000 Units total) by mouth every 7 (seven) days. 11/09/20  Laurey Morale, MD    Allergies    Amoxicillin, Codeine, Lipitor [atorvastatin], Lisinopril, Nitrofurantoin, and Tramadol  Review of Systems   Review of Systems  All other systems reviewed and are negative.  Physical Exam Updated Vital Signs BP (!) 143/83   Pulse 82   Temp 98.2 F (36.8 C)   Resp (!) 21   Ht 1.575 m (5\' 2" )   Wt 54.4 kg   SpO2 96%   BMI 21.95 kg/m   Physical Exam Vitals and nursing note reviewed.  Constitutional:      General: She is not in acute distress.    Appearance: She is well-developed.  HENT:     Head: Normocephalic and atraumatic.     Mouth/Throat:     Pharynx: No oropharyngeal exudate.  Eyes:     General: No scleral icterus.       Right eye: No discharge.        Left eye: No discharge.     Conjunctiva/sclera: Conjunctivae normal.     Pupils: Pupils are equal, round, and reactive to light.  Neck:     Thyroid: No thyromegaly.     Vascular: No JVD.  Cardiovascular:     Rate and Rhythm: Normal rate and regular rhythm.     Heart sounds: Normal heart sounds. No murmur heard.   No friction rub. No gallop.  Pulmonary:     Effort: Pulmonary effort is normal. No respiratory distress.     Breath sounds: Normal breath sounds. No wheezing or rales.  Abdominal:     General: Bowel sounds are normal. There is  no distension.     Palpations: Abdomen is soft. There is no mass.     Tenderness: There is no abdominal tenderness.  Musculoskeletal:        General: No tenderness. Normal range of motion.     Cervical back: Normal range of motion and neck supple.  Lymphadenopathy:     Cervical: No cervical adenopathy.  Skin:    General: Skin is warm and dry.     Findings: No erythema or rash.  Neurological:     Mental Status: She is alert.     Coordination: Coordination normal.     Comments: The patient has some difficulty getting around the room due to back pain but is neurologically intact in the supine position with normal strength in all 4 extremities, normal facial symmetry, cranial nerves III through XII are normal and her speech is clear and goal-directed  Psychiatric:        Behavior: Behavior normal.    ED Results / Procedures / Treatments   Labs (all labs ordered are listed, but only abnormal results are displayed) Labs Reviewed  COMPREHENSIVE METABOLIC PANEL - Abnormal; Notable for the following components:      Result Value   Glucose, Bld 106 (*)    All other components within normal limits  URINALYSIS, ROUTINE W REFLEX MICROSCOPIC - Abnormal; Notable for the following components:   Leukocytes,Ua TRACE (*)    Bacteria, UA RARE (*)    All other components within normal limits  CBC WITH DIFFERENTIAL/PLATELET  PROTIME-INR  AMMONIA  TROPONIN I (HIGH SENSITIVITY)  TROPONIN I (HIGH SENSITIVITY)    EKG EKG Interpretation  Date/Time:  Monday January 17 2021 18:47:43 EDT Ventricular Rate:  100 PR Interval:    QRS Duration: 88 QT Interval:  350 QTC Calculation: 451 R Axis:   -58 Text Interpretation: Atrial fibrillation Left axis deviation Anteroseptal infarct , age undetermined  Abnormal ECG since last tracing no significant change Confirmed by Daleen Bo [phone]) on 01/18/2021 12:20:11 AM Also confirmed by Daleen Bo [phone]), editor Hattie Perch ([id number])  on 01/18/2021  11:52:41 AM  Radiology CT Head Wo Contrast  Result Date: 01/17/2021 CLINICAL DATA: Per EMS patient from home, patient reports slurred speech started at 1700 while on the phone with her son; Hx of CVAs. Slurred speech resolved by EMS arrival at 1750; No other deficits noted at this time; No stroke deficits from past CVAs. Patient reports it feels like her speech is back to normal. EXAM: CT HEAD WITHOUT CONTRAST TECHNIQUE: Contiguous axial images were obtained from the base of the skull through the vertex without intravenous contrast. COMPARISON:  08/18/2016 there is is a stroke stroke may be seen in the the extra-axial finding pancreas rim a we did this here is here great have at FINDINGS: Brain: No evidence of acute infarction or of intracranial hemorrhage. No hydrocephalus. Ventricles are normal in configuration. There is age appropriate ventricular and sulcal enlargement. Old right axilla pleural and inferior cerebellar infarcts. Small lacunar infarct suggested along the anterior limb of the right internal capsule not evident on the prior study. This does not appear recent. Patchy areas of white matter hypoattenuation are noted consistent with mild chronic microvascular ischemic change. Calcified extra-axial mass abuts the left posterior frontal lobe, measuring 1.5 x 0.8 cm, 1.0 x 0.5 cm on the most recent prior head CT from 2018. This is consistent with a meningioma. No other extra-axial masses. No abnormal extra-axial fluid collections. Vascular: No hyperdense vessel or unexpected calcification. Skull: Normal. Negative for fracture or focal lesion. Sinuses/Orbits: Globes and orbits are unremarkable. Mild bilateral maxillary sinus mucosal thickening. Minor ethmoid and anterior sphenoid sinus mucosal thickening. Other: None. IMPRESSION: 1. No acute intracranial abnormalities. 2. Old infarcts and mild chronic microvascular ischemic change. 3. Small left posterior frontal region meningioma. Electronically  Signed   By: Lajean Manes M.D.   On: 01/17/2021 20:42   MR BRAIN WO CONTRAST  Result Date: 01/18/2021 CLINICAL DATA:  Neuro deficit, acute, stroke suspected. History of meningioma. Patient presents with several episodes of garbled speech after fall 8 in February/March. EXAM: MRI HEAD WITHOUT CONTRAST TECHNIQUE: Multiplanar, multiecho pulse sequences of the brain and surrounding structures were obtained without intravenous contrast. COMPARISON:  Prior head CT examinations 01/17/2021 and earlier. Brain MRI 08/18/2016. FINDINGS: Brain: Moderate generalized cerebral and cerebellar atrophy. Redemonstrated broad-based and dural-based mass overlying the posterolateral left frontal lobe compatible with meningioma. This now measures 1.9 x 1.1 cm in transaxial dimensions (previously 1.4 x 0.8 cm). Redemonstrated small chronic cortically based infarct within the high posterior left frontal lobe (acute at time of the prior brain MRI of 08/18/2016) (series 11, image 21 on today's exam). Redemonstrated small chronic cortically based infarct within the right occipital temporal lobes (for instance as seen on series 11, image 9). Minimal chronic hemosiderin deposition at this site. Background mild for age multifocal T2/FLAIR hyperintensity within the cerebral white matter and pons, nonspecific but compatible with chronic small vessel ischemic disease. Chronic infarcts within the left cerebellum. A 6 mm infarct within the lateral left cerebellar hemisphere is new as compared to the prior MRI. There is no acute infarct. No extra-axial fluid collection. No midline shift. Vascular: Expected proximal arterial flow voids. Skull and upper cervical spine: No focal marrow lesion. Sinuses/Orbits: Visualized orbits show no acute finding. Bilateral lens replacements. M mild right frontal, bilateral ethmoid and right greater than left sphenoid sinus mucosal  thickening. Mild-to-moderate bilateral maxillary sinus mucosal thickening.  IMPRESSION: No evidence of acute intracranial abnormality. 1.9 x 1.1 cm meningioma overlying the posterolateral left frontal lobe, slightly increased in size from the brain MRI of 08/18/2016. Mild local mass effect upon the underlying left frontal lobe without underlying vasogenic edema. Redemonstrated small chronic cortically based infarcts within the high posterior left frontal lobe and right occipital temporal lobes. Stable mild chronic small vessel ischemic disease within the cerebral white matter and pons. Chronic infarcts within the left cerebellum. A 6 mm infarct within the lateral left cerebellar hemisphere is new as compared to the prior MRI. Paranasal sinus disease, as described. Electronically Signed   By: Kellie Simmering DO   On: 01/18/2021 12:33   EEG adult  Result Date: 01/18/2021 Lora Havens, MD     01/18/2021 11:24 AM Patient Name: Christine Middleton MRN: [id number] Epilepsy Attending: Lora Havens Referring Physician/Provider: Dr Donnetta Simpers Date: 01/18/2021 Duration: 24.35 mins Patient history: 87 y.o. female with PMH significant for GERD, prior stroke, Afibb on eliquis, meningioma who presents with several episodes of garbled speech lasting seconds to minutes with no loss of consciousness. EEG to evaluate for seizure Level of alertness: Awake, asleep AEDs during EEG study: None Technical aspects: This EEG study was done with scalp electrodes positioned according to the 10-20 International system of electrode placement. Electrical activity was acquired at a sampling rate of 500Hz  and reviewed with a high frequency filter of 70Hz  and a low frequency filter of 1Hz . EEG data were recorded continuously and digitally stored. Description: The posterior dominant rhythm consists of 8 Hz activity of moderate voltage (25-35 uV) seen predominantly in posterior head regions, symmetric and reactive to eye opening and eye closing. Sleep was characterized by vertex waves, sleep spindles (12 to 14 Hz),  maximal frontocentral region. EEG showed intermittent generalized 3 to 6 Hz theta-delta slowing. Hyperventilation and photic stimulation were not performed.   ABNORMALITY - Intermittent slow, generalized IMPRESSION: This study is suggestive of mild diffuse encephalopathy, nonspecific etiology. No seizures or epileptiform discharges were seen throughout the recording. Lora Havens    Procedures Procedures   Medications Ordered in ED Medications  levETIRAcetam (KEPPRA) tablet 500 mg (has no administration in time range)    ED Course  I have reviewed the triage vital signs and the nursing notes.  Pertinent labs & imaging results that were available during my care of the patient were reviewed by me and considered in my medical decision making (see chart for details).  Clinical Course as of 01/18/21 1250  Tue Jan 18, 2021  0902 D/w Neurohospitalist who has seen the CT and considers that this may be from seizures.  Wants a routine EEG, he will come and see the patient in consultation [BM]    Clinical Course User Index [BM] Noemi Chapel, MD   MDM Rules/Calculators/A&P                          I have reviewed the patient's labs, she has a negative urinalysis, negative troponin, she has a metabolic panel which is unremarkable, CBC showing no anemia, she has an INR which is 1.1 a CT scan of the head which shows no acute findings though there are multiple old infarcts.  Obviously at the age of 65 and not having any acute symptoms and being outside the tPA window there is no need for an acute intervention however I will discussed the case with  neurology to see if they have any other alternatives in work-up or care.  Neuro has seen and recommends Keppra bid - and f/u due to meningioma which may be pushing on the speech area - NS referral made at their request.  Also, EEG neg.  Pt stable for d/c.  No new signs of ischemia on MRI.  Final Clinical Impression(s) / ED Diagnoses Final diagnoses:   Slurred speech    Rx / DC Orders ED Discharge Orders          Ordered    levETIRAcetam (KEPPRA) 500 MG tablet  2 times daily        01/18/21 1246             Noemi Chapel, MD 01/18/21 1250

## 2021-01-18 NOTE — ED Notes (Signed)
Pt transported to MRI 

## 2021-01-18 NOTE — Progress Notes (Signed)
Brief Neuro Update:  Reviewed MRI Brain without contrast with 1.9 x 1.1 cm meningioma overlying the posterolateral left frontal lobe, slightly increased in size from the brain MRI of 08/18/2016. Mild local mass effect upon the underlying left frontal lobe without underlying vasogenic edema.  Routine EEG with no epileptiform discharges.  Her presentation coupled with the MRI Brain does concern me for potential epileptic aphasia.  I would recommend starting Keppra 500mg  BID and follow up with neurosrugery to monitor the size of her meningioma.  She does not drive. Also will benefit from Neurology follow up outpatient.  Oka to discharge from Neurology standpoint.  [place] Pager Number [phone]

## 2021-01-20 ENCOUNTER — Telehealth: Payer: Self-pay | Admitting: Pharmacist

## 2021-01-20 NOTE — Chronic Care Management (AMB) (Addendum)
Chronic Care Management Pharmacy Assistant   Name: Christine Middleton  MRN: [id number] DOB: 06-13-1923  Reason for Encounter: Disease State/ Hyperlipidemia Assessment Call.    Conditions to be addressed/monitored: HLD  Recent office visits:  None.   Recent consult visits:  None.   Hospital visits:  Medication Reconciliation was completed by comparing discharge summary, patient's EMR and Pharmacy list, and upon discussion with patient.  Admitted to the hospital on 01/17/21 due to Slurred Speech. Discharge date was 01/18/21. Discharged from [facility]?Medications Started at [facility] Discharge:?? -started levETIRAcetam 500mg  2 times daily.   Medication Changes at Hospital Discharge: -Changed None.   Medications Discontinued at Hospital Discharge: -Stopped None.   Medications that remain the same after Hospital Discharge:??  -All other medications will remain the same.    Medications: Outpatient Encounter Medications as of 01/20/2021  Medication Sig Note   acetaminophen (TYLENOL) 325 MG tablet Take 325 mg by mouth 2 (two) times daily.    atenolol (TENORMIN) 50 MG tablet Take 1 tablet (50 mg total) by mouth daily. (Patient taking differently: Take 100 mg by mouth daily.) 10/22/2020: Dose change 50 mg daily as of 10/23/20   docusate sodium (COLACE) 100 MG capsule Take 2 capsules (200 mg total) by mouth 2 (two) times daily. (Patient taking differently: Take 100 mg by mouth daily. Taking twice daily)    ELIQUIS 2.5 MG TABS tablet TAKE 1 TABLET BY MOUTH TWICE A DAY (Patient taking differently: Take 2.5 mg by mouth 2 (two) times daily.)    ferrous sulfate 325 (65 FE) MG tablet Take 1 tablet (325 mg total) by mouth every other day.    hydrocortisone (ANUSOL-HC) 25 MG suppository Place 1 suppository (25 mg total) rectally 2 (two) times daily. (Patient taking differently: Place 25 mg rectally 2 (two) times daily as needed for hemorrhoids.)    levETIRAcetam (KEPPRA)  500 MG tablet Take 1 tablet (500 mg total) by mouth 2 (two) times daily.    lidocaine (LMX) 4 % cream Apply 1 application topically daily as needed (pain).    methocarbamol (ROBAXIN) 500 MG tablet Take 1 tablet (500 mg total) by mouth every 8 (eight) hours as needed for muscle spasms.    mirtazapine (REMERON) 15 MG tablet TAKE 1 TABLET BY MOUTH EVERYDAY AT BEDTIME    pantoprazole (PROTONIX) 40 MG tablet Take 1 tablet (40 mg total) by mouth daily.    polycarbophil (FIBERCON) 625 MG tablet Take 625 mg by mouth daily.    polyethylene glycol (MIRALAX / GLYCOLAX) 17 g packet Take 17 g by mouth daily.    simvastatin (ZOCOR) 20 MG tablet TAKE 1 TABLET BY MOUTH EVERYDAY AT BEDTIME (Patient taking differently: Take 20 mg by mouth every evening.)    Vitamin D, Ergocalciferol, (DRISDOL) 1.25 MG (50000 UNIT) CAPS capsule Take 1 capsule (50,000 Units total) by mouth every 7 (seven) days.    No facility-administered encounter medications on file as of 01/20/2021.   Comprehensive medication review performed; Spoke to patient regarding cholesterol  Lipid Panel    Component Value Date/Time   CHOL 153 11/08/2020 1046   TRIG 135.0 11/08/2020 1046   TRIG 92 05/28/2006 1104   HDL 61.90 11/08/2020 1046   LDLCALC 64 11/08/2020 1046   LDLDIRECT 113.3 07/18/2012 1113    10-year ASCVD risk score: The ASCVD Risk score [name] DC Jr., et al., 2013) failed to calculate for the following reasons:   The 2013 ASCVD risk score is only  valid for ages 60 to 27   The patient has a prior MI or stroke diagnosis  Current antihyperlipidemic regimen:  Simvastatin 20mg  - take one tablet every evening.  Previous antihyperlipidemic medications tried: Atorvastatin (myalgias)  ASCVD risk enhancing conditions: age >47 and HTN What recent interventions/DTPs have been made by any provider to improve Cholesterol control since last CPP Visit: None.  Any recent hospitalizations or ED visits since last visit with CPP? Yes What diet  changes have been made to improve Cholesterol?  Patients daughter Christine Middleton states that patient does not eat much due to her health declining. Patient has a meningioma pressing on the base of her frontal lobe of brain and patient is now non verbal. Patient drinks boost shakes about a half of one at a time. She will eat a little dinner in the evenings but not much else and they are trying their best to keep water in her to keep her from getting dehydrated.  What exercise is being done to improve Cholesterol?  Patient is a huge fall risk and is not able to do much activity without her walker and nothing strenuous.   Adherence Review: Does the patient have >5 day gap between last estimated fill dates? No  Notes: Spoke with Christine Middleton patients daughter who states that patient is now non verbal and she is taking all of her prescribed medcications including the Keppra now which makes her really sleepy. She stated her and her brothers take turns watching her mother with about 30 minutes to get patient to take all of her medications. Patient does not have much of an appetite.   Star Rating Drugs:  Simvastatin 20mg  -  last filled on 11/13/20 90DS at [facility] [facility] Pharmacist Assistant [phone]

## 2021-01-25 ENCOUNTER — Encounter: Payer: Self-pay | Admitting: Nurse Practitioner

## 2021-01-27 DIAGNOSIS — S32010D Wedge compression fracture of first lumbar vertebra, subsequent encounter for fracture with routine healing: Secondary | ICD-10-CM | POA: Diagnosis not present

## 2021-01-27 DIAGNOSIS — D329 Benign neoplasm of meninges, unspecified: Secondary | ICD-10-CM | POA: Diagnosis not present

## 2021-02-07 ENCOUNTER — Inpatient Hospital Stay: Payer: Medicare Other

## 2021-02-14 ENCOUNTER — Ambulatory Visit (INDEPENDENT_AMBULATORY_CARE_PROVIDER_SITE_OTHER): Payer: Medicare Other

## 2021-02-14 ENCOUNTER — Other Ambulatory Visit: Payer: Self-pay

## 2021-02-14 VITALS — BP 128/70 | HR 68 | Temp 98.0°F | Ht 65.0 in | Wt 121.0 lb

## 2021-02-14 DIAGNOSIS — Z0000 Encounter for general adult medical examination without abnormal findings: Secondary | ICD-10-CM

## 2021-02-14 NOTE — Progress Notes (Signed)
Subjective:   Christine Middleton is a 87 y.o. female who presents for Medicare Annual Initial preventive examination.  Review of Systems    N/a       Objective:    There were no vitals filed for this visit. There is no height or weight on file to calculate BMI.  Advanced Directives 01/17/2021 10/23/2020 09/04/2020 07/08/2019 07/08/2019 08/21/2016 10/04/2013  Does Patient Have a Medical Advance Directive? Yes Yes No No No Yes Patient has advance directive, copy not in chart  Type of Advance Directive [place];Living will [place] -  Does patient want to make changes to medical advance directive? No - Patient declined No - Patient declined - - - No - Patient declined No change requested  Copy of [place] in Chart? - - - - - No - copy requested -  Would patient like information on creating a medical advance directive? - - No - Patient declined No - Patient declined - - -  Pre-existing out of facility DNR order (yellow form or pink MOST form) - - - - - - No    Current Medications (verified) Outpatient Encounter Medications as of 02/14/2021  Medication Sig   acetaminophen (TYLENOL) 325 MG tablet Take 325 mg by mouth 2 (two) times daily.   atenolol (TENORMIN) 50 MG tablet Take 1 tablet (50 mg total) by mouth daily. (Patient taking differently: Take 100 mg by mouth daily.)   docusate sodium (COLACE) 100 MG capsule Take 2 capsules (200 mg total) by mouth 2 (two) times daily. (Patient taking differently: Take 100 mg by mouth daily. Taking twice daily)   ELIQUIS 2.5 MG TABS tablet TAKE 1 TABLET BY MOUTH TWICE A DAY (Patient taking differently: Take 2.5 mg by mouth 2 (two) times daily.)   ferrous sulfate 325 (65 FE) MG tablet Take 1 tablet (325 mg total) by mouth every other day.   hydrocortisone (ANUSOL-HC) 25 MG suppository Place 1 suppository (25 mg total) rectally 2 (two) times daily. (Patient taking  differently: Place 25 mg rectally 2 (two) times daily as needed for hemorrhoids.)   levETIRAcetam (KEPPRA) 500 MG tablet Take 1 tablet (500 mg total) by mouth 2 (two) times daily.   lidocaine (LMX) 4 % cream Apply 1 application topically daily as needed (pain).   methocarbamol (ROBAXIN) 500 MG tablet Take 1 tablet (500 mg total) by mouth every 8 (eight) hours as needed for muscle spasms.   mirtazapine (REMERON) 15 MG tablet TAKE 1 TABLET BY MOUTH EVERYDAY AT BEDTIME   pantoprazole (PROTONIX) 40 MG tablet Take 1 tablet (40 mg total) by mouth daily.   polycarbophil (FIBERCON) 625 MG tablet Take 625 mg by mouth daily.   polyethylene glycol (MIRALAX / GLYCOLAX) 17 g packet Take 17 g by mouth daily.   simvastatin (ZOCOR) 20 MG tablet TAKE 1 TABLET BY MOUTH EVERYDAY AT BEDTIME (Patient taking differently: Take 20 mg by mouth every evening.)   Vitamin D, Ergocalciferol, (DRISDOL) 1.25 MG (50000 UNIT) CAPS capsule Take 1 capsule (50,000 Units total) by mouth every 7 (seven) days.   No facility-administered encounter medications on file as of 02/14/2021.    Allergies (verified) Amoxicillin, Codeine, Lipitor [atorvastatin], Lisinopril, Nitrofurantoin, and Tramadol   History: Past Medical History:  Diagnosis Date   Diverticulitis    Esophageal stricture    GERD (gastroesophageal reflux disease)    Hyperlipidemia    Hypertension    Shingles  Stroke [facility])    Vitamin B 12 deficiency    Past Surgical History:  Procedure Laterality Date   ABDOMINAL HYSTERECTOMY     APPENDECTOMY     COLONOSCOPY  12-09-08   per Dr. Sharlett Iles (incomplete along with barium enema 12-10-08) with hemorrhoids  and diverticulae   COLONOSCOPY WITH PROPOFOL N/A 07/10/2019   Procedure: COLONOSCOPY WITH PROPOFOL;  Surgeon: Irving Copas., MD;  Location: [facility];  Service: Gastroenterology;  Laterality: N/A;   ESOPHAGOGASTRODUODENOSCOPY  09-2004   per Dr. Sharlett Iles with dilatation    EYE SURGERY     HEMOSTASIS  CLIP PLACEMENT  07/10/2019   Procedure: HEMOSTASIS CLIP PLACEMENT;  Surgeon: Irving Copas., MD;  Location: [facility];  Service: Gastroenterology;;   POLYPECTOMY  07/10/2019   Procedure: POLYPECTOMY;  Surgeon: Irving Copas., MD;  Location: [facility];  Service: Gastroenterology;;   TONSILLECTOMY     Family History  Problem Relation Age of Onset   Heart attack Mother    Heart disease Father    Breast cancer Other    Diabetes Other    Hypertension Other    Stroke Other    Heart disease Other    Stroke Sister    Cancer Brother    Cancer Brother    Diabetes Brother    Social History   Socioeconomic History   Marital status: Widowed    Spouse name: Grayling Congress. Zeck   Number of children: 4   Years of education: HS   Highest education level: Not on file  Occupational History   Occupation: Retired    Fish farm manager: RETIRED  Tobacco Use   Smoking status: Never   Smokeless tobacco: Never  Vaping Use   Vaping Use: Never used  Substance and Sexual Activity   Alcohol use: No    Alcohol/week: 0.0 standard drinks   Drug use: No   Sexual activity: Not on file  Other Topics Concern   Not on file  Social History Narrative   Patient lives at home with family.   Patient is right handed.   Patient has a high school education.   Caffeine Use: 1 cup daily   Social Determinants of Health   Financial Resource Strain: Low Risk    Difficulty of Paying Living Expenses: Not hard at all  Food Insecurity: Not on file  Transportation Needs: No Transportation Needs   Lack of Transportation (Medical): No   Lack of Transportation (Non-Medical): No  Physical Activity: Not on file  Stress: Not on file  Social Connections: Not on file    Tobacco Counseling Counseling given: Not Answered   Clinical Intake:                 Diabetic?no         Activities of Daily Living In your present state of health, do you have any difficulty performing the following  activities: 10/22/2020 09/06/2020  Hearing? Tempie Donning  Vision? N N  Difficulty concentrating or making decisions? N N  Walking or climbing stairs? Y N  Dressing or bathing? Y N  Doing errands, shopping? Y N  Some recent data might be hidden    Patient Care Team: Laurey Morale, MD as PCP - General [place], Peter M, MD as PCP - Cardiology (Cardiology) Germaine Pomfret, [facility] as Pharmacist (Pharmacist)  Indicate any recent Medical Services you may have received from other than [facility] providers in the past year (date may be approximate).     Assessment:   This is  a routine wellness examination for Christine Middleton.  Hearing/Vision screen No results found.  Dietary issues and exercise activities discussed:     Goals Addressed   None    Depression Screen PHQ 2/9 Scores 09/14/2020 10/02/2018 09/26/2017 07/28/2014  PHQ - 2 Score 3 1 6  0    Fall Risk Fall Risk  09/14/2020 10/02/2018 09/26/2017 07/28/2014  Falls in the past year? 1 0 No No  Number falls in past yr: 0 - - -  Injury with Fall? 1 - - -  Comment fractured vertabra - - -    FALL RISK PREVENTION PERTAINING TO THE HOME:  Any stairs in or around the home? Yes  If so, are there any without handrails? No  Home free of loose throw rugs in walkways, pet beds, electrical cords, etc? Yes  Adequate lighting in your home to reduce risk of falls? Yes   ASSISTIVE DEVICES UTILIZED TO PREVENT FALLS:  Life alert? No  Use of a cane, walker or w/c? Yes  Grab bars in the bathroom? Yes  Shower chair or bench in shower? Yes  Elevated toilet seat or a handicapped toilet? Yes   TIMED UP AND GO:  Was the test performed? Yes .  Length of time to ambulate 10 feet: wheelchair  sec.   Gait slow and steady without use of assistive device  Cognitive Function:        Immunizations Immunization History  Administered Date(s) Administered   Fluad Quad(high Dose 65+) 07/02/2019   Influenza Split 07/03/2012   Influenza Whole 04/30/2006, 05/30/2007,  04/07/2010   Influenza, High Dose Seasonal PF 05/19/2015, 05/02/2017, 04/17/2018   Influenza,inj,Quad PF,6+ Mos 05/09/2013, 07/28/2014   Influenza-Unspecified 05/26/2016, 05/02/2017   PFIZER(Purple Top)SARS-COV-2 Vaccination 12/31/2019, 01/21/2020   Pneumococcal Conjugate-13 08/05/2015   Pneumococcal Polysaccharide-23 07/31/2000, 05/30/2007   Tdap 10/02/2018    TDAP status: Up to date  Flu Vaccine status: Up to date  Pneumococcal vaccine status: Up to date  Covid-19 vaccine status: Completed vaccines  Qualifies for Shingles Vaccine? Yes   Zostavax completed No   Shingrix Completed?: No.    Education has been provided regarding the importance of this vaccine. Patient has been advised to call insurance company to determine out of pocket expense if they have not yet received this vaccine. Advised may also receive vaccine at local pharmacy or Health Dept. Verbalized acceptance and understanding.  Screening Tests Health Maintenance  Topic Date Due   Zoster Vaccines- Shingrix (1 of 2) Never done   DEXA SCAN  Never done   COVID-19 Vaccine (3 - Pfizer risk series) 02/18/2020   INFLUENZA VACCINE  02/28/2021   TETANUS/TDAP  10/01/2028   PNA vac Low Risk Adult  Completed   HPV VACCINES  Aged Out    Health Maintenance  Health Maintenance Due  Topic Date Due   Zoster Vaccines- Shingrix (1 of 2) Never done   DEXA SCAN  Never done   COVID-19 Vaccine (3 - Pfizer risk series) 02/18/2020    Colorectal cancer screening: No longer required.   Mammogram status: No longer required due to age.  Bone Density status: Completed per pateint . Results reflect: Bone density results: OSTEOPENIA. Repeat every 5 years.  Lung Cancer Screening: (Low Dose CT Chest recommended if Age 24-80 years, 30 pack-year currently smoking OR have quit w/in 15years.) does not qualify.   Lung Cancer Screening Referral: n/a  Additional Screening:  Hepatitis C Screening: does not qualify  Vision Screening:  Recommended annual ophthalmology exams for early detection of  glaucoma and other disorders of the eye. Is the patient up to date with their annual eye exam?  Yes  Who is the provider or what is the name of the office in which the patient attends annual eye exams? Dr,Grote  If pt is not established with a provider, would they like to be referred to a provider to establish care? Yes .   Dental Screening: Recommended annual dental exams for proper oral hygiene  Community Resource Referral / Chronic Care Management: CRR required this visit?  No   CCM required this visit?  No      Plan:     I have personally reviewed and noted the following in the patient's chart:   Medical and social history Use of alcohol, tobacco or illicit drugs  Current medications and supplements including opioid prescriptions.  Functional ability and status Nutritional status Physical activity Advanced directives List of other physicians Hospitalizations, surgeries, and ER visits in previous 12 months Vitals Screenings to include cognitive, depression, and falls Referrals and appointments  In addition, I have reviewed and discussed with patient certain preventive protocols, quality metrics, and best practice recommendations. A written personalized care plan for preventive services as well as general preventive health recommendations were provided to patient.     [name], LPN   04/28/2445   Nurse Notes: none

## 2021-02-14 NOTE — Patient Instructions (Signed)
Christine Middleton , Thank you for taking time to come for your Medicare Wellness Visit. I appreciate your ongoing commitment to your health goals. Please review the following plan we discussed and let me know if I can assist you in the future.   Screening recommendations/referrals: Colonoscopy: no longer required  Mammogram: no longer required  Bone Density: no longer required  Recommended yearly ophthalmology/optometry visit for glaucoma screening and checkup Recommended yearly dental visit for hygiene and checkup  Vaccinations: Influenza vaccine: due in the fall  Pneumococcal vaccine: completed series  Tdap vaccine: 10/02/2018 Shingles vaccine: declined   Advanced directives: will provide copies   Conditions/risks identified: none   Next appointment: none    Preventive Care 58 Years and Older, Female Preventive care refers to lifestyle choices and visits with your health care provider that can promote health and wellness. What does preventive care include? A yearly physical exam. This is also called an annual well check. Dental exams once or twice a year. Routine eye exams. Ask your health care provider how often you should have your eyes checked. Personal lifestyle choices, including: Daily care of your teeth and gums. Regular physical activity. Eating a healthy diet. Avoiding tobacco and drug use. Limiting alcohol use. Practicing safe sex. Taking low-dose aspirin every day. Taking vitamin and mineral supplements as recommended by your health care provider. What happens during an annual well check? The services and screenings done by your health care provider during your annual well check will depend on your age, overall health, lifestyle risk factors, and family history of disease. Counseling  Your health care provider may ask you questions about your: Alcohol use. Tobacco use. Drug use. Emotional well-being. Home and relationship well-being. Sexual activity. Eating  habits. History of falls. Memory and ability to understand (cognition). Work and work Statistician. Reproductive health. Screening  You may have the following tests or measurements: Height, weight, and BMI. Blood pressure. Lipid and cholesterol levels. These may be checked every 5 years, or more frequently if you are over 63 years old. Skin check. Lung cancer screening. You may have this screening every year starting at age 8 if you have a 30-pack-year history of smoking and currently smoke or have quit within the past 15 years. Fecal occult blood test (FOBT) of the stool. You may have this test every year starting at age 9. Flexible sigmoidoscopy or colonoscopy. You may have a sigmoidoscopy every 5 years or a colonoscopy every 10 years starting at age 93. Hepatitis C blood test. Hepatitis B blood test. Sexually transmitted disease (STD) testing. Diabetes screening. This is done by checking your blood sugar (glucose) after you have not eaten for a while (fasting). You may have this done every 1-3 years. Bone density scan. This is done to screen for osteoporosis. You may have this done starting at age 60. Mammogram. This may be done every 1-2 years. Talk to your health care provider about how often you should have regular mammograms. Talk with your health care provider about your test results, treatment options, and if necessary, the need for more tests. Vaccines  Your health care provider may recommend certain vaccines, such as: Influenza vaccine. This is recommended every year. Tetanus, diphtheria, and acellular pertussis (Tdap, Td) vaccine. You may need a Td booster every 10 years. Zoster vaccine. You may need this after age 41. Pneumococcal 13-valent conjugate (PCV13) vaccine. One dose is recommended after age 52. Pneumococcal polysaccharide (PPSV23) vaccine. One dose is recommended after age 53. Talk to your health  care provider about which screenings and vaccines you need and how  often you need them. This information is not intended to replace advice given to you by your health care provider. Make sure you discuss any questions you have with your health care provider. Document Released: 08/13/2015 Document Revised: 04/05/2016 Document Reviewed: 05/18/2015 Elsevier Interactive Patient Education  2017 [place] Prevention in the Home Falls can cause injuries. They can happen to people of all ages. There are many things you can do to make your home safe and to help prevent falls. What can I do on the outside of my home? Regularly fix the edges of walkways and driveways and fix any cracks. Remove anything that might make you trip as you walk through a door, such as a raised step or threshold. Trim any bushes or trees on the path to your home. Use bright outdoor lighting. Clear any walking paths of anything that might make someone trip, such as rocks or tools. Regularly check to see if handrails are loose or broken. Make sure that both sides of any steps have handrails. Any raised decks and porches should have guardrails on the edges. Have any leaves, snow, or ice cleared regularly. Use sand or salt on walking paths during winter. Clean up any spills in your garage right away. This includes oil or grease spills. What can I do in the bathroom? Use night lights. Install grab bars by the toilet and in the tub and shower. Do not use towel bars as grab bars. Use non-skid mats or decals in the tub or shower. If you need to sit down in the shower, use a plastic, non-slip stool. Keep the floor dry. Clean up any water that spills on the floor as soon as it happens. Remove soap buildup in the tub or shower regularly. Attach bath mats securely with double-sided non-slip rug tape. Do not have throw rugs and other things on the floor that can make you trip. What can I do in the bedroom? Use night lights. Make sure that you have a light by your bed that is easy to  reach. Do not use any sheets or blankets that are too big for your bed. They should not hang down onto the floor. Have a firm chair that has side arms. You can use this for support while you get dressed. Do not have throw rugs and other things on the floor that can make you trip. What can I do in the kitchen? Clean up any spills right away. Avoid walking on wet floors. Keep items that you use a lot in easy-to-reach places. If you need to reach something above you, use a strong step stool that has a grab bar. Keep electrical cords out of the way. Do not use floor polish or wax that makes floors slippery. If you must use wax, use non-skid floor wax. Do not have throw rugs and other things on the floor that can make you trip. What can I do with my stairs? Do not leave any items on the stairs. Make sure that there are handrails on both sides of the stairs and use them. Fix handrails that are broken or loose. Make sure that handrails are as long as the stairways. Check any carpeting to make sure that it is firmly attached to the stairs. Fix any carpet that is loose or worn. Avoid having throw rugs at the top or bottom of the stairs. If you do have throw rugs, attach them to  the floor with carpet tape. Make sure that you have a light switch at the top of the stairs and the bottom of the stairs. If you do not have them, ask someone to add them for you. What else can I do to help prevent falls? Wear shoes that: Do not have high heels. Have rubber bottoms. Are comfortable and fit you well. Are closed at the toe. Do not wear sandals. If you use a stepladder: Make sure that it is fully opened. Do not climb a closed stepladder. Make sure that both sides of the stepladder are locked into place. Ask someone to hold it for you, if possible. Clearly mark and make sure that you can see: Any grab bars or handrails. First and last steps. Where the edge of each step is. Use tools that help you move  around (mobility aids) if they are needed. These include: Canes. Walkers. Scooters. Crutches. Turn on the lights when you go into a dark area. Replace any light bulbs as soon as they burn out. Set up your furniture so you have a clear path. Avoid moving your furniture around. If any of your floors are uneven, fix them. If there are any pets around you, be aware of where they are. Review your medicines with your doctor. Some medicines can make you feel dizzy. This can increase your chance of falling. Ask your doctor what other things that you can do to help prevent falls. This information is not intended to replace advice given to you by your health care provider. Make sure you discuss any questions you have with your health care provider. Document Released: 05/13/2009 Document Revised: 12/23/2015 Document Reviewed: 08/21/2014 Elsevier Interactive Patient Education  2017 [employer].

## 2021-03-16 ENCOUNTER — Telehealth: Payer: Self-pay | Admitting: Pharmacist

## 2021-03-16 NOTE — Chronic Care Management (AMB) (Signed)
Chronic Care Management Pharmacy Assistant   Name: Christine Middleton  MRN: [id number] DOB: [birth year]  Reason for Encounter: Disease State/ General Assessment Call.   Conditions to be addressed/monitored: HTN and HLD   Recent office visits:  02/14/21 Christine Pigg LPN [facility]) - seen for annual wellness exam. No medication changes. Follow up in 1 year.  Recent consult visits:  None.  Hospital visits:  None in previous 6 months  Medications: Outpatient Encounter Medications as of 03/16/2021  Medication Sig Note   acetaminophen (TYLENOL) 325 MG tablet Take 325 mg by mouth 2 (two) times daily.    atenolol (TENORMIN) 50 MG tablet Take 1 tablet (50 mg total) by mouth daily. (Patient taking differently: Take 100 mg by mouth daily.) 10/22/2020: Dose change 50 mg daily as of 10/23/20   docusate sodium (COLACE) 100 MG capsule Take 2 capsules (200 mg total) by mouth 2 (two) times daily. (Patient taking differently: Take 100 mg by mouth daily. Taking twice daily)    ELIQUIS 2.5 MG TABS tablet TAKE 1 TABLET BY MOUTH TWICE A DAY (Patient taking differently: Take 2.5 mg by mouth 2 (two) times daily.)    ferrous sulfate 325 (65 FE) MG tablet Take 1 tablet (325 mg total) by mouth every other day.    hydrocortisone (ANUSOL-HC) 25 MG suppository Place 1 suppository (25 mg total) rectally 2 (two) times daily. (Patient taking differently: Place 25 mg rectally 2 (two) times daily as needed for hemorrhoids.)    levETIRAcetam (KEPPRA) 500 MG tablet Take 1 tablet (500 mg total) by mouth 2 (two) times daily.    lidocaine (LMX) 4 % cream Apply 1 application topically daily as needed (pain).    methocarbamol (ROBAXIN) 500 MG tablet Take 1 tablet (500 mg total) by mouth every 8 (eight) hours as needed for muscle spasms.    mirtazapine (REMERON) 15 MG tablet TAKE 1 TABLET BY MOUTH EVERYDAY AT BEDTIME    pantoprazole (PROTONIX) 40 MG tablet Take 1 tablet (40 mg total) by mouth daily.    polycarbophil  (FIBERCON) 625 MG tablet Take 625 mg by mouth daily. (Patient not taking: Reported on 02/14/2021)    polyethylene glycol (MIRALAX / GLYCOLAX) 17 g packet Take 17 g by mouth daily.    simvastatin (ZOCOR) 20 MG tablet TAKE 1 TABLET BY MOUTH EVERYDAY AT BEDTIME (Patient taking differently: Take 20 mg by mouth every evening.)    Vitamin D, Ergocalciferol, (DRISDOL) 1.25 MG (50000 UNIT) CAPS capsule Take 1 capsule (50,000 Units total) by mouth every 7 (seven) days.    No facility-administered encounter medications on file as of 03/16/2021.   Fill History: ELIQUIS 2.5 MG TABLET 12/18/2020 90   ATENOLOL 50 MG TABLET 01/15/2021 90   VITAMIN D2 1.'25MG'$ (50,000 UNIT) 01/13/2021 84   FERROUS SULFATE 325 MG TABLET 10/24/2020 120   MIRTAZAPINE 15 MG TABS 01/19/2021 30   PANTOPRAZOLE SOD DR 40 MG TAB 03/01/2021 90   SIMVASTATIN 20 MG TABLET 02/10/2021 90   LEVETIRACETAM 250 MG TABLET 03/02/2021 30   Reviewed chart prior to disease state call. Spoke with patient regarding BP  Recent Office Vitals: BP Readings from Last 3 Encounters:  02/14/21 128/70  01/18/21 124/87  11/26/20 (!) 162/85   Pulse Readings from Last 3 Encounters:  02/14/21 68  01/18/21 87  11/26/20 78    Wt Readings from Last 3 Encounters:  02/14/21 121 lb (54.9 kg)  01/17/21 120 lb (54.4 kg)  11/08/20 127 lb 12.8 oz (58 kg)  Kidney Function Lab Results  Component Value Date/Time   CREATININE 0.72 01/17/2021 07:35 PM   CREATININE 0.83 11/26/2020 12:36 PM   CREATININE 0.86 10/23/2020 02:03 AM   GFR 52.66 (L) 10/22/2020 02:39 PM   GFRNONAA >60 01/17/2021 07:35 PM   GFRNONAA >60 11/26/2020 12:36 PM   GFRAA >60 07/11/2019 06:36 AM    BMP Latest Ref Rng & Units 01/17/2021 11/26/2020 10/23/2020  Glucose 70 - 99 mg/dL 106(H) 95 115(H)  BUN 8 - 23 mg/dL '11 11 10  '$ Creatinine 0.44 - 1.00 mg/dL 0.72 0.83 0.86  Sodium 135 - 145 mmol/L 139 142 137  Potassium 3.5 - 5.1 mmol/L 3.8 3.8 3.8  Chloride 98 - 111 mmol/L 102 107 107   CO2 22 - 32 mmol/L '27 24 22  '$ Calcium 8.9 - 10.3 mg/dL 9.2 9.3 8.8(L)    Current antihypertensive regimen:  Atenolol '50mg'$  - take 1 tablet by mouth daily. How often are you checking your Blood Pressure?  Patient has not been checking her blood pressure. Current home BP readings: None to report. What recent interventions/DTPs have been made by any provider to improve Blood Pressure control since last CPP Visit: None. Any recent hospitalizations or ED visits since last visit with CPP? No  Adherence Review: Is the patient currently on ACE/ARB medication? No Does the patient have >5 day gap between last estimated fill dates? No   Comprehensive medication review performed; Spoke to patient regarding cholesterol  Lipid Panel    Component Value Date/Time   CHOL 153 11/08/2020 1046   TRIG 135.0 11/08/2020 1046   TRIG 92 05/28/2006 1104   HDL 61.90 11/08/2020 1046   LDLCALC 64 11/08/2020 1046   LDLDIRECT 113.3 07/18/2012 1113    10-year ASCVD risk score: The ASCVD Risk score [name] DC Jr., et al., 2013) failed to calculate for the following reasons:   The 2013 ASCVD risk score is only valid for ages 18 to 93   The patient has a prior MI or stroke diagnosis  Current antihyperlipidemic regimen:  Simvastatin '20mg'$  - take 1 tablet daily.  Previous antihyperlipidemic medications tried: atorvastatin (mylagias). ASCVD risk enhancing conditions: age >53 and HTN What recent interventions/DTPs have been made by any provider to improve Cholesterol control since last CPP Visit: None. Any recent hospitalizations or ED visits since last visit with CPP? No  Adherence Review: Does the patient have >5 day gap between last estimated fill dates? No  Notes: Spoke with patient and tried to review medications as prescribed. Patient knew most of her medications and reports no changes or issues from her medications currently. However, patient daughter Christine Middleton keeps up with her medications and fills patients  pill boxes a week at a time so patient could not remember every single medication she takes. Christine Middleton at work during call and could not be reached. Patient stated she is 30 and just getting older and she does not check her blood pressure at home anymore due to the fact she does not feel she needs to. I encouraged patient to try checking just once a week and write her reading down. Patient stated she will try. Patient stated she still eats whatever she wants and she is able to make meals for herself still but her son and daughter do bring her food and help her out still. Patient stated for breakfast she likes to have dry cereal, fresh fruit or a pancake. Patient does not drink coffee. For lunch today her son brought her a ham sandwich and some  pinto beans with some banana pudding. Patient can make her own dinner most of the time and she likes pizza but when she cooks she makes a meat like chicken or salmon with a vegetable like greens. Patient stated she has salmon about twice a week for her cholesterol. Patient drinks plenty of water and she does have two glasses of sweet tea everyday. Patient uses a walker and is not able to do any type of regular activity and she's not able to get outside. She is able to navigate her home with no issues with her walker. She is still able to cook and clean and she lives alone with he son and daughter rotating in visiting her and helping patient. Patient stated she has struggles with her energy level and fatigue getting worse but she knows its apart of the aging process. Patient thanked me for my call.   Care Gaps:  AWV- completed on 02/14/21 Zoster vaccines - never done Dexa scan - never done Covid-19 vaccine booster 3 - overdue since 02/18/20 Flu vaccine - due  Star Rating Drugs:  Simvastatin '20mg'$  -  last filled on 02/10/21 90DS at [place] Pharmacist Assistant [phone]

## 2021-03-29 ENCOUNTER — Telehealth: Payer: Self-pay | Admitting: Cardiology

## 2021-03-29 MED ORDER — APIXABAN 2.5 MG PO TABS: 2.5000 mg | ORAL_TABLET | Freq: Two times a day (BID) | ORAL | 1 refills | Status: DC

## 2021-03-29 NOTE — Telephone Encounter (Signed)
Prescription refill request for Eliquis received. Last office visit:MENG 11/26/19 (OVERDUE) ONE IS SCHEDULED IN THE FUTURE W/LAMBERT PA-C Scr:0.72 01/17/21 Age: 49F Weight:58KG

## 2021-03-29 NOTE — Telephone Encounter (Signed)
*  STAT* If patient is at the pharmacy, call can be transferred to refill team.   1. Which medications need to be refilled? (please list name of each medication and dose if known)  ELIQUIS 2.5 MG TABS tablet  2. Which pharmacy/location (including street and city if local pharmacy) is medication to be sent to? [id number] [facility] [address]. Do they need a 30 day or 90 day supply? 90 with refills    Patient is scheduled to see JCaron Presume10/26/22. The patient only has two weeks of medication left

## 2021-04-05 ENCOUNTER — Telehealth: Payer: Self-pay | Admitting: Pharmacist

## 2021-04-05 NOTE — Chronic Care Management (AMB) (Signed)
    Chronic Care Management Pharmacy Assistant   Name: Christine Middleton  MRN: [id number] DOB: [birth year]  Spoke with patient and rescheduled her appointment with [name] the clinical pharmacist to 06/01/21 at 3:45pm via office. Patient thanked me for my call.  Medications: Outpatient Encounter Medications as of 04/05/2021  Medication Sig Note   acetaminophen (TYLENOL) 325 MG tablet Take 325 mg by mouth 2 (two) times daily.    apixaban (ELIQUIS) 2.5 MG TABS tablet Take 1 tablet (2.5 mg total) by mouth 2 (two) times daily.    atenolol (TENORMIN) 50 MG tablet Take 1 tablet (50 mg total) by mouth daily. (Patient taking differently: Take 100 mg by mouth daily.) 10/22/2020: Dose change 50 mg daily as of 10/23/20   docusate sodium (COLACE) 100 MG capsule Take 2 capsules (200 mg total) by mouth 2 (two) times daily. (Patient taking differently: Take 100 mg by mouth daily. Taking twice daily)    ferrous sulfate 325 (65 FE) MG tablet Take 1 tablet (325 mg total) by mouth every other day.    hydrocortisone (ANUSOL-HC) 25 MG suppository Place 1 suppository (25 mg total) rectally 2 (two) times daily. (Patient taking differently: Place 25 mg rectally 2 (two) times daily as needed for hemorrhoids.)    levETIRAcetam (KEPPRA) 500 MG tablet Take 1 tablet (500 mg total) by mouth 2 (two) times daily.    lidocaine (LMX) 4 % cream Apply 1 application topically daily as needed (pain).    methocarbamol (ROBAXIN) 500 MG tablet Take 1 tablet (500 mg total) by mouth every 8 (eight) hours as needed for muscle spasms.    mirtazapine (REMERON) 15 MG tablet TAKE 1 TABLET BY MOUTH EVERYDAY AT BEDTIME    pantoprazole (PROTONIX) 40 MG tablet Take 1 tablet (40 mg total) by mouth daily.    polycarbophil (FIBERCON) 625 MG tablet Take 625 mg by mouth daily. (Patient not taking: Reported on 02/14/2021)    polyethylene glycol (MIRALAX / GLYCOLAX) 17 g packet Take 17 g by mouth daily.    simvastatin (ZOCOR) 20 MG tablet TAKE 1 TABLET BY  MOUTH EVERYDAY AT BEDTIME (Patient taking differently: Take 20 mg by mouth every evening.)    Vitamin D, Ergocalciferol, (DRISDOL) 1.25 MG (50000 UNIT) CAPS capsule Take 1 capsule (50,000 Units total) by mouth every 7 (seven) days.    No facility-administered encounter medications on file as of 04/05/2021.    Care Gaps:  AWV - completed on 02/14/21 Zoster vaccines - never done Dexa scan - never done Covid-19 vaccine booster 3 - overdue since 02/18/20 Flu vaccine - due  Star Rating Drugs:  Simvastatin '20mg'$  - last filled on 02/10/21 90DS at [place] Pharmacist Assistant [phone]

## 2021-04-06 ENCOUNTER — Ambulatory Visit: Payer: Medicare Other

## 2021-04-07 ENCOUNTER — Ambulatory Visit: Payer: Medicare Other

## 2021-05-02 ENCOUNTER — Inpatient Hospital Stay: Payer: Medicare Other | Attending: Hematology | Admitting: Hematology

## 2021-05-02 ENCOUNTER — Inpatient Hospital Stay: Payer: Medicare Other

## 2021-05-02 ENCOUNTER — Encounter: Payer: Self-pay | Admitting: Hematology

## 2021-05-02 ENCOUNTER — Other Ambulatory Visit: Payer: Self-pay

## 2021-05-02 VITALS — BP 154/76 | HR 67 | Temp 97.7°F | Resp 17 | Ht 65.0 in | Wt 125.2 lb

## 2021-05-02 DIAGNOSIS — I4891 Unspecified atrial fibrillation: Secondary | ICD-10-CM | POA: Diagnosis not present

## 2021-05-02 DIAGNOSIS — D500 Iron deficiency anemia secondary to blood loss (chronic): Secondary | ICD-10-CM

## 2021-05-02 DIAGNOSIS — D509 Iron deficiency anemia, unspecified: Secondary | ICD-10-CM | POA: Insufficient documentation

## 2021-05-02 DIAGNOSIS — Z7901 Long term (current) use of anticoagulants: Secondary | ICD-10-CM | POA: Insufficient documentation

## 2021-05-02 LAB — CBC WITH DIFFERENTIAL (CANCER CENTER ONLY)
Abs Immature Granulocytes: 0.01 10*3/uL (ref 0.00–0.07)
Basophils Absolute: 0.1 10*3/uL (ref 0.0–0.1)
Basophils Relative: 1 %
Eosinophils Absolute: 0.1 10*3/uL (ref 0.0–0.5)
Eosinophils Relative: 2 %
HCT: 38.5 % (ref 36.0–46.0)
Hemoglobin: 13 g/dL (ref 12.0–15.0)
Immature Granulocytes: 0 %
Lymphocytes Relative: 16 %
Lymphs Abs: 0.9 10*3/uL (ref 0.7–4.0)
MCH: 31.9 pg (ref 26.0–34.0)
MCHC: 33.8 g/dL (ref 30.0–36.0)
MCV: 94.6 fL (ref 80.0–100.0)
Monocytes Absolute: 0.6 10*3/uL (ref 0.1–1.0)
Monocytes Relative: 11 %
Neutro Abs: 4.2 10*3/uL (ref 1.7–7.7)
Neutrophils Relative %: 70 %
Platelet Count: 198 10*3/uL (ref 150–400)
RBC: 4.07 MIL/uL (ref 3.87–5.11)
RDW: 12.7 % (ref 11.5–15.5)
WBC Count: 5.9 10*3/uL (ref 4.0–10.5)
nRBC: 0 % (ref 0.0–0.2)

## 2021-05-02 LAB — CMP (CANCER CENTER ONLY)
ALT: 10 U/L (ref 0–44)
AST: 17 U/L (ref 15–41)
Albumin: 3.6 g/dL (ref 3.5–5.0)
Alkaline Phosphatase: 100 U/L (ref 38–126)
Anion gap: 8 (ref 5–15)
BUN: 17 mg/dL (ref 8–23)
CO2: 27 mmol/L (ref 22–32)
Calcium: 9.2 mg/dL (ref 8.9–10.3)
Chloride: 108 mmol/L (ref 98–111)
Creatinine: 0.81 mg/dL (ref 0.44–1.00)
GFR, Estimated: >60 mL/min (ref >60)
Glucose, Bld: 104 mg/dL — ABNORMAL HIGH (ref 70–99)
Potassium: 4.2 mmol/L (ref 3.5–5.1)
Sodium: 143 mmol/L (ref 135–145)
Total Bilirubin: 0.7 mg/dL (ref 0.3–1.2)
Total Protein: 6.6 g/dL (ref 6.5–8.1)

## 2021-05-02 LAB — IRON AND TIBC
Iron: 129 ug/dL (ref 41–142)
Saturation Ratios: 53 % (ref 21–57)
TIBC: 241 ug/dL (ref 236–444)
UIBC: 112 ug/dL — ABNORMAL LOW (ref 120–384)

## 2021-05-02 LAB — FERRITIN: Ferritin: 149 ng/mL (ref 11–307)

## 2021-05-02 NOTE — Progress Notes (Signed)
[place]   [phone] [phone]   Clinic Follow up Note   Patient Care Team: Laurey Morale, MD as PCP - General [place], Peter M, MD as PCP - Cardiology (Cardiology) Germaine Pomfret, [facility] as Pharmacist (Pharmacist)  Date of Service:  05/02/2021  CHIEF COMPLAINT: f/u of iron deficiency anemia  CURRENT THERAPY:  IV Venofer as needed  ASSESSMENT & PLAN:  Christine Middleton is a 87 y.o. female with   1.  Iron deficiency anemia, secondary to GI blood loss  -She has mild intermittent anemia Hg 9-12 since 2018, no other cytopenias. Primary bone marrow disorder is less likely  -B12, folate, and TSH/T3/T4 normal 10/22/20, her anemia is not related to these nutritional deficiencies or hormonal -renal function is normal lately, no CKD.  -Iron studies 10/22/20 show ferritin 6.5, 2% saturation ratio, serum iron <10, and Hg 7.5 on hospital admission. This is consistent with iron deficiency anemia likely from GI blood loss from hemorrhoids and severe diverticulosis (seen on last colonoscopy 07/2019).  -CT from previous admission 09/04/20 negative for other source of bleeding or malignancy. -s/p RBC transfusion x2 10/22/20, Hg improved to 10.9 on discharge from hospital. She started oral iron every other day 10/28/20. I recommend to take with vitamin C to promote absorption. -she received three doses of IV Venofer in 10/2020. With those and the hemorrhoid banding, her anemia has resolved. -we will continue to check her labs every 3-4 months.   2.  Bleeding hemorrhoids -Previous colonoscopies in 2006, 2010, and 07/2019 showed internal and external hemorrhoids (bleeding in 2020) and diverticulosis -She has been referred for hemorrhoidectomy in the past but did not pursue referral once rectal bleeding stopped -She reports having had injections in the past that controlled her rectal bleeding for up to 2 years -Referred back to Dr. Rush Landmark by PCP, hemorrhoids banded in  late 10/2020.   3. A. fib, h/o CVA x2, HTN, HL -She sustained 2 strokes while on Plavix, she was found to have A. Fib -On Eliquis, which will increase her risk of bleeding  -Followed by PCP Dr. Sarajane Jews   4. Osteoporotic fracture  -CT 09/04/20 shows acute L1 fracture as well as L2 and L3 compression fractures.  -evaluated by neurosurgery, procedure not recommended -wears brace occasionally     PLAN: -continue oral iron, she can try liquid iron -labs every 3 months, IV Venofer if ferritin <30 -f/u in 9 months   No problem-specific Assessment & Plan notes found for this encounter.   INTERVAL HISTORY:  Christine Middleton is here for a follow up of anemia. She was last seen by NP Lacie on 11/01/20 in consultation. She presents to the clinic accompanied by daughter Christine Middleton. Christine Middleton reports Christine Middleton is taking oral iron twice a week due to vomiting. I recommend she try liquid iron.   All other systems were reviewed with the patient and are negative.  MEDICAL HISTORY:  Past Medical History:  Diagnosis Date   Diverticulitis    Esophageal stricture    GERD (gastroesophageal reflux disease)    Hyperlipidemia    Hypertension    Shingles    Stroke ([place])    Vitamin B 12 deficiency     SURGICAL HISTORY: Past Surgical History:  Procedure Laterality Date   ABDOMINAL HYSTERECTOMY     APPENDECTOMY     COLONOSCOPY  12-09-08   per Dr. Sharlett Iles (incomplete along with barium enema 12-10-08) with hemorrhoids  and diverticulae   COLONOSCOPY WITH PROPOFOL N/A  07/10/2019   Procedure: COLONOSCOPY WITH PROPOFOL;  Surgeon: Rush Landmark Telford Nab., MD;  Location: [facility];  Service: Gastroenterology;  Laterality: N/A;   ESOPHAGOGASTRODUODENOSCOPY  09-2004   per Dr. Sharlett Iles with dilatation    EYE SURGERY     HEMOSTASIS CLIP PLACEMENT  07/10/2019   Procedure: HEMOSTASIS CLIP PLACEMENT;  Surgeon: Irving Copas., MD;  Location: [facility];  Service: Gastroenterology;;   POLYPECTOMY  07/10/2019    Procedure: POLYPECTOMY;  Surgeon: Irving Copas., MD;  Location: [facility];  Service: Gastroenterology;;   TONSILLECTOMY      I have reviewed the social history and family history with the patient and they are unchanged from previous note.  ALLERGIES:  is allergic to amoxicillin, codeine, lipitor [atorvastatin], lisinopril, nitrofurantoin, and tramadol.  MEDICATIONS:  Current Outpatient Medications  Medication Sig Dispense Refill   acetaminophen (TYLENOL) 325 MG tablet Take 325 mg by mouth 2 (two) times daily.     apixaban (ELIQUIS) 2.5 MG TABS tablet Take 1 tablet (2.5 mg total) by mouth 2 (two) times daily. 180 tablet 1   atenolol (TENORMIN) 50 MG tablet Take 1 tablet (50 mg total) by mouth daily. (Patient taking differently: Take 100 mg by mouth daily.) 180 tablet 3   docusate sodium (COLACE) 100 MG capsule Take 2 capsules (200 mg total) by mouth 2 (two) times daily. (Patient taking differently: Take 100 mg by mouth daily. Taking twice daily) 10 capsule 0   ferrous sulfate 325 (65 FE) MG tablet Take 1 tablet (325 mg total) by mouth every other day. 60 tablet 0   hydrocortisone (ANUSOL-HC) 25 MG suppository Place 1 suppository (25 mg total) rectally 2 (two) times daily. (Patient taking differently: Place 25 mg rectally 2 (two) times daily as needed for hemorrhoids.) 24 suppository 11   levETIRAcetam (KEPPRA) 500 MG tablet Take 1 tablet (500 mg total) by mouth 2 (two) times daily. 60 tablet 0   lidocaine (LMX) 4 % cream Apply 1 application topically daily as needed (pain).     methocarbamol (ROBAXIN) 500 MG tablet Take 1 tablet (500 mg total) by mouth every 8 (eight) hours as needed for muscle spasms. 30 tablet 0   mirtazapine (REMERON) 15 MG tablet TAKE 1 TABLET BY MOUTH EVERYDAY AT BEDTIME 30 tablet 0   pantoprazole (PROTONIX) 40 MG tablet Take 1 tablet (40 mg total) by mouth daily. 90 tablet 3   polycarbophil (FIBERCON) 625 MG tablet Take 625 mg by mouth daily. (Patient not  taking: Reported on 02/14/2021)     polyethylene glycol (MIRALAX / GLYCOLAX) 17 g packet Take 17 g by mouth daily. 14 each 0   simvastatin (ZOCOR) 20 MG tablet TAKE 1 TABLET BY MOUTH EVERYDAY AT BEDTIME (Patient taking differently: Take 20 mg by mouth every evening.) 90 tablet 3   Vitamin D, Ergocalciferol, (DRISDOL) 1.25 MG (50000 UNIT) CAPS capsule Take 1 capsule (50,000 Units total) by mouth every 7 (seven) days. 12 capsule 3   No current facility-administered medications for this visit.    PHYSICAL EXAMINATION: ECOG PERFORMANCE STATUS: 1 - Symptomatic but completely ambulatory  Vitals:   05/02/21 1154  BP: (!) 154/76  Pulse: 67  Resp: 17  Temp: 97.7 F (36.5 C)  SpO2: 97%   Wt Readings from Last 3 Encounters:  05/02/21 125 lb 3.2 oz (56.8 kg)  02/14/21 121 lb (54.9 kg)  01/17/21 120 lb (54.4 kg)     GENERAL:alert, no distress and comfortable SKIN: skin color normal, no rashes or significant lesions EYES:  normal, Conjunctiva are pink and non-injected, sclera clear  NEURO: alert & oriented x 3 with fluent speech  LABORATORY DATA:  I have reviewed the data as listed CBC Latest Ref Rng & Units 05/02/2021 01/17/2021 11/26/2020  WBC 4.0 - 10.5 K/uL 5.9 6.5 7.1  Hemoglobin 12.0 - 15.0 g/dL 13.0 13.3 12.9  Hematocrit 36.0 - 46.0 % 38.5 41.5 41.2  Platelets 150 - 400 K/uL 198 226 211     CMP Latest Ref Rng & Units 05/02/2021 01/17/2021 11/26/2020  Glucose 70 - 99 mg/dL 104(H) 106(H) 95  BUN 8 - 23 mg/dL 17 11 11   Creatinine 0.44 - 1.00 mg/dL 0.81 0.72 0.83  Sodium 135 - 145 mmol/L 143 139 142  Potassium 3.5 - 5.1 mmol/L 4.2 3.8 3.8  Chloride 98 - 111 mmol/L 108 102 107  CO2 22 - 32 mmol/L 27 27 24   Calcium 8.9 - 10.3 mg/dL 9.2 9.2 9.3  Total Protein 6.5 - 8.1 g/dL 6.6 6.8 7.4  Total Bilirubin 0.3 - 1.2 mg/dL 0.7 0.9 0.7  Alkaline Phos 38 - 126 U/L 100 89 126  AST 15 - 41 U/L 17 20 16   ALT 0 - 44 U/L 10 14 7       RADIOGRAPHIC STUDIES: I have personally reviewed the  radiological images as listed and agreed with the findings in the report. No results found.    No orders of the defined types were placed in this encounter.  All questions were answered. The patient knows to call the clinic with any problems, questions or concerns. No barriers to learning was detected. The total time spent in the appointment was 20 minutes.     Truitt Merle, MD 05/02/2021   I, Wilburn Mylar, am acting as scribe for Truitt Merle, MD.   I have reviewed the above documentation for accuracy and completeness, and I agree with the above.

## 2021-05-04 NOTE — Addendum Note (Signed)
Addended by: Estella Husk on: 05/04/2021 02:38 PM   Modules accepted: Orders

## 2021-05-06 NOTE — Progress Notes (Signed)
Spoke with patient via telephone and informed her that her Ferritin is above goal level and will NOT need further IV Iron.  Told pt she will need to continue taking PO Iron and will return for labs as scheduled.  Pt is HOH but verbalized understanding of instructions.  Pt requested that I contact her daughter [name] as well which I did via telephone.  Left voicemail message for [name] to return my telephone call regarding her mother's iron blood work and infusion.  Awaiting [name]'s return phone call.

## 2021-05-10 ENCOUNTER — Other Ambulatory Visit: Payer: Self-pay | Admitting: Family Medicine

## 2021-05-12 DIAGNOSIS — H04123 Dry eye syndrome of bilateral lacrimal glands: Secondary | ICD-10-CM | POA: Diagnosis not present

## 2021-05-12 DIAGNOSIS — H353132 Nonexudative age-related macular degeneration, bilateral, intermediate dry stage: Secondary | ICD-10-CM | POA: Diagnosis not present

## 2021-05-12 DIAGNOSIS — H10413 Chronic giant papillary conjunctivitis, bilateral: Secondary | ICD-10-CM | POA: Diagnosis not present

## 2021-05-12 DIAGNOSIS — Z961 Presence of intraocular lens: Secondary | ICD-10-CM | POA: Diagnosis not present

## 2021-05-25 ENCOUNTER — Ambulatory Visit: Payer: Medicare Other | Admitting: Physician Assistant

## 2021-05-31 ENCOUNTER — Telehealth: Payer: Self-pay | Admitting: Pharmacist

## 2021-05-31 NOTE — Chronic Care Management (AMB) (Signed)
    Chronic Care Management Pharmacy Assistant   Name: Christine Middleton  MRN: [id number] DOB: 04/08/1923  06/01/21 APPOINTMENT REMINDER   Patients daughter [name] was reminded to have all medications, supplements and any blood glucose and blood pressure readings available for review with [name], Pharm. D, at her office visit on 06/01/21 at 3:45pm.   Questions: Have you had any recent office visit or specialist visit outside of [place]? No.  Are there any concerns you would like to discuss during your office visit? Patients daughter [name] has some questions regarding the need for Simvastatin and keppra. [name] states that patients medication list with PCP needs to be updated.   Are you having any problems obtaining your medications? No issues at this time.  If patient has any PAP medications ask if they are having any problems getting their PAP medication or refill? Patient currently receives patient assistance for some of her medications.  Care Gaps:  AWV - completed on 02/14/21 Zoster vaccines - never done Dexa scan - never done Covid-19 vaccine booster 3 - overdue since 02/18/20 Flu vaccine - due   Star Rating Drug:  Simvastatin 20mg  - [facility]   Any gaps in medications fill history? No.  [place]  Clinical Pharmacist Assistant [phone]

## 2021-06-01 ENCOUNTER — Other Ambulatory Visit: Payer: Self-pay

## 2021-06-01 ENCOUNTER — Ambulatory Visit (INDEPENDENT_AMBULATORY_CARE_PROVIDER_SITE_OTHER): Payer: Medicare Other | Admitting: Pharmacist

## 2021-06-01 VITALS — BP 146/78

## 2021-06-01 DIAGNOSIS — Z23 Encounter for immunization: Secondary | ICD-10-CM

## 2021-06-01 DIAGNOSIS — I10 Essential (primary) hypertension: Secondary | ICD-10-CM

## 2021-06-01 DIAGNOSIS — E782 Mixed hyperlipidemia: Secondary | ICD-10-CM

## 2021-06-01 NOTE — Patient Instructions (Signed)
Hi Christine Middleton and Christine Middleton,   It was great to get to meet you both in person! Go ahead and check your blood pressures 1-2 times a week at least leading up to your appointment with cardiology. Also, don't forget to take your vitamin D with a meal and think about switching to ferrous gluconate to see if this helps with your stomach upset.  I will call you back once I have a chance to talk to Dr. Sarajane Jews.  Please reach out to me if you have any questions or need anything!  Best, Maddie  [name], PharmD, [place] [facility] [place]  Visit Information   Goals Addressed   None    Patient Care Plan: CCM Pharmacy Care Plan     Problem Identified: Problem: Hypertension, Hyperlipidemia, Atrial Fibrillation, Osteoporosis, and vitamin D deficiency, anemia, insomnia      Long-Range Goal: Patient-Specific Goal   Start Date: 06/01/2021  Expected End Date: 06/01/2022  This Visit's Progress: On track  Priority: High  Note:   Current Barriers:  Unable to independently monitor therapeutic efficacy  Pharmacist Clinical Goal(s):  Patient will achieve adherence to monitoring guidelines and medication adherence to achieve therapeutic efficacy through collaboration with PharmD and provider.   Interventions: 1:1 collaboration with Laurey Morale, MD regarding development and update of comprehensive plan of care as evidenced by provider attestation and co-signature Inter-disciplinary care team collaboration (see longitudinal plan of care) Comprehensive medication review performed; medication list updated in electronic medical record  Hypertension (BP goal <140/90) -Not ideally controlled -Current treatment: Atenolol 50 mg daily -Medications previously tried: losartan, amlodipine (low BP)  -Current home readings: 139/100, 137/86, 143/84, 141/87 140/86 -Current dietary habits: did not discuss -Current exercise habits: walks with walker -Denies  hypotensive/hypertensive symptoms -Educated on BP goals and benefits of medications for prevention of heart attack, stroke and kidney damage; Importance of home blood pressure monitoring; Proper BP monitoring technique; -Counseled to monitor BP at home weekly, document, and provide log at future appointments -Counseled on diet and exercise extensively Recommended to continue current medication Requested updated prescription for atenolol to be sent to the pharmacy.  Hyperlipidemia: (LDL goal < 70) -Controlled -Current treatment: Simvastatin 20 mg 1 tablet daily -Medications previously tried: none  -Current dietary patterns: did not discuss -Current exercise habits: did not discuss -Educated on Cholesterol goals;  -Collaborated with PCP about considering stopping simvastatin.  Atrial Fibrillation (Goal: prevent stroke and major bleeding) -Not ideally controlled -CHADSVASC: 6 -Current treatment: Rate control:  Atenolol 50 mg daily Anticoagulation: Eliquis 2.5 mg 1 tablet twice daily -Medications previously tried: none -Home BP and HR readings: pulse is running in the 70-80s range  -Counseled on increased risk of stroke due to Afib and benefits of anticoagulation for stroke prevention; -Counseled on diet and exercise extensively Recommended to continue current medication Recommended discussing with cardiology about possibly increasing atenolol based on Afib frequency.   Vitamin D deficiency (Goal: vitamin D 30-100) -Uncontrolled -Current treatment  Vitamin D 50,000 units once weekly -Medications previously tried: none  -Recommended taking with food.  GERD/Barrett's esophagus (Goal: minimize symptoms) -Controlled -Current treatment  Pantoprazole 40 mg 1 tablet daily -Medications previously tried: none  -Recommended to continue current medication Counseled on benefits of PPIs for Barrett's esophagus.  Possible seizure (Goal: prevent seizures) -Controlled -Current treatment   Levetiracetam 250 mg 1 tablet twice daily - took 125 mg BID recently -Medications previously tried: none  -Recommended discussing with neurology about stopping if not in fact  a seizure.   Anemia (Goal: prevent bleeding) -Controlled -Current treatment  Ferrous sulfate 325 mg weekly -Medications previously tried: none  -Recommended trial of ferrous gluconate to help with GI side effects.  Insomnia (Goal: improve quality and quantity of sleep) -Controlled -Current treatment  Mirtazapine 15 mg 1/2 tablet daily -Medications previously tried: none  -Recommended to continue current medication Requested updated prescription with correct directions.  Health Maintenance -Vaccine gaps: shingrix, COVID booster -Current therapy:  Miralax as needed Docusate 100 mg twice daily -Educated on Cost vs benefit of each product must be carefully weighed by individual consumer -Patient is satisfied with current therapy and denies issues -Recommended to continue current medication  Patient Goals/Self-Care Activities Patient will:  - take medications as prescribed as evidenced by patient report and record review check blood pressure weekly, document, and provide at future appointments  Follow Up Plan: The care management team will reach out to the patient again over the next 30 days.        Patient verbalizes understanding of instructions provided today and agrees to view in [place].  The pharmacy team will reach out to the patient again over the next 30 days.   Viona Gilmore, [facility]

## 2021-06-01 NOTE — Progress Notes (Signed)
Chronic Care Management Pharmacy Note  06/01/2021 Name:  Christine Middleton MRN:  [id number] DOB:  03-04-1923  Summary: Pt does not check BP routinely at home Pt inquired about stopping any medications  Recommendations/Changes made from today's visit: -Requested updated prescriptions for atenolol and mirtazapine  -Consider stopping statin due to age -Recommended taking vitamin D with food -Recommended checking BP at pulse at home regularly leading up to cardiology appt  Plan: Follow up BP assessment in 3-4 months   Subjective: Christine Middleton is an 87 y.o. year old female who is a primary patient of Laurey Morale, MD.  The CCM team was consulted for assistance with disease management and care coordination needs.    Engaged with patient face to face for follow up visit in response to provider referral for pharmacy case management and/or care coordination services.   Consent to Services:  The patient was given information about Chronic Care Management services, agreed to services, and gave verbal consent prior to initiation of services.  Please see initial visit note for detailed documentation.   Patient Care Team: Laurey Morale, MD as PCP - General [place], Peter M, MD as PCP - Cardiology (Cardiology) Viona Gilmore, [facility] as Pharmacist (Pharmacist)  Recent office visits: 02/14/21 [name] LPN  - seen for annual wellness exam. No medication changes. Follow up in 1 year.  10/29/20 [name], MD: Patient presented for chronic conditions follow up. Prescribed vitamin D weekly x 12 weeks with 3 refills.  Recent consult visits: 05/02/21 Truitt Merle, MD (oncology): Patient presented for iron deficiency anemia follow up.  Continue with liquid iron.  Hospital visits: Medication Reconciliation was completed by comparing discharge summary, patient's EMR and Pharmacy list, and upon discussion with patient.   Admitted to the hospital on 01/17/21 due to Slurred Speech. Discharge date was  01/18/21. Discharged from [facility]?Medications Started at [facility] Discharge:?? -started levETIRAcetam 594m 2 times daily.    Medication Changes at Hospital Discharge: -Changed None.    Medications Discontinued at Hospital Discharge: -Stopped None.    Medications that remain the same after Hospital Discharge:??  -All other medications will remain the same.     Objective:  Lab Results  Component Value Date   CREATININE 0.81 05/02/2021   BUN 17 05/02/2021   GFR 52.66 (L) 10/22/2020   GFRNONAA >60 05/02/2021   GFRAA >60 07/11/2019   NA 143 05/02/2021   K 4.2 05/02/2021   CALCIUM 9.2 05/02/2021   CO2 27 05/02/2021   GLUCOSE 104 (H) 05/02/2021    Lab Results  Component Value Date/Time   HGBA1C 6.1 10/22/2020 02:39 PM   HGBA1C 5.9 11/07/2019 10:45 AM   GFR 52.66 (L) 10/22/2020 02:39 PM   GFR 52.09 (L) 11/07/2019 10:45 AM    Last diabetic Eye exam: No results found for: HMDIABEYEEXA  Last diabetic Foot exam: No results found for: HMDIABFOOTEX   Lab Results  Component Value Date   CHOL 153 11/08/2020   HDL 61.90 11/08/2020   LDLCALC 64 11/08/2020   LDLDIRECT 113.3 07/18/2012   TRIG 135.0 11/08/2020   CHOLHDL 2 11/08/2020    Hepatic Function Latest Ref Rng & Units 05/02/2021 01/17/2021 11/26/2020  Total Protein 6.5 - 8.1 g/dL 6.6 6.8 7.4  Albumin 3.5 - 5.0 g/dL 3.6 3.7 3.6  AST 15 - 41 U/L _0 ALT 0 - 44 U/L _1 Alk Phosphatase 38 - 126 U/L 100 89  126  Total Bilirubin 0.3 - 1.2 mg/dL 0.7 0.9 0.7  Bilirubin, Direct 0.0 - 0.3 mg/dL - - -    Lab Results  Component Value Date/Time   TSH 0.66 10/22/2020 02:39 PM   TSH 1.35 11/07/2019 10:45 AM   FREET4 1.26 10/22/2020 02:39 PM    CBC Latest Ref Rng & Units 05/02/2021 01/17/2021 11/26/2020  WBC 4.0 - 10.5 K/uL 5.9 6.5 7.1  Hemoglobin 12.0 - 15.0 g/dL 13.0 13.3 12.9  Hematocrit 36.0 - 46.0 % 38.5 41.5 41.2  Platelets 150 - 400 K/uL 198 226 211    Lab Results  Component Value  Date/Time   VD25OH 8.70 (L) 11/08/2020 10:46 AM    Clinical ASCVD: Yes  The ASCVD Risk score (Arnett DK, et al., 2019) failed to calculate for the following reasons:   The 2019 ASCVD risk score is only valid for ages 67 to 42   The patient has a prior MI or stroke diagnosis    Depression screen [facility] 2/9 02/14/2021 02/14/2021 09/14/2020  Decreased Interest 0 0 0  Down, Depressed, Hopeless 0 0 3  PHQ - 2 Score 0 0 3    CHA2DS2/VAS Stroke Risk Points  Current as of 9 minutes ago     6 >= 2 Points: High Risk  1 - 1.99 Points: Medium Risk  0 Points: Low Risk    Last Change: N/A      Details    This score determines the patient's risk of having a stroke if the  patient has atrial fibrillation.       Points Metrics  0 Has Congestive Heart Failure:  No    Current as of 9 minutes ago  0 Has Vascular Disease:  No    Current as of 9 minutes ago  1 Has Hypertension:  Yes    Current as of 9 minutes ago  2 Age:  52    Current as of 9 minutes ago  0 Has Diabetes:  No    Current as of 9 minutes ago  2 Had Stroke:  Yes  Had TIA:  No  Had Thromboembolism:  No    Current as of 9 minutes ago  1 Female:  Yes    Current as of 9 minutes ago      Social History   Tobacco Use  Smoking Status Never  Smokeless Tobacco Never   BP Readings from Last 3 Encounters:  06/01/21 (!) 146/78  05/02/21 (!) 154/76  02/14/21 128/70   Pulse Readings from Last 3 Encounters:  05/02/21 67  02/14/21 68  01/18/21 87   Wt Readings from Last 3 Encounters:  05/02/21 125 lb 3.2 oz (56.8 kg)  02/14/21 121 lb (54.9 kg)  01/17/21 120 lb (54.4 kg)   BMI Readings from Last 3 Encounters:  05/02/21 20.83 kg/m  02/14/21 20.14 kg/m  01/17/21 21.95 kg/m    Assessment/Interventions: Review of patient past medical history, allergies, medications, health status, including review of consultants reports, laboratory and other test data, was performed as part of comprehensive evaluation and provision of chronic  care management services.   SDOH:  (Social Determinants of Health) assessments and interventions performed: Yes SDOH Interventions    Flowsheet Row Most Recent Value  SDOH Interventions   Financial Strain Interventions Intervention Not Indicated      SDOH Screenings   Alcohol Screen: Low Risk    Last Alcohol Screening Score (AUDIT): 0  Depression (PHQ2-9): Low Risk    PHQ-2 Score: 0  Financial  Resource Strain: Low Risk    Difficulty of Paying Living Expenses: Not very hard  Food Insecurity: No Food Insecurity   Worried About Charity fundraiser in the Last Year: Never true   Ran Out of Food in the Last Year: Never true  Housing: Low Risk    Last Housing Risk Score: 0  Physical Activity: Inactive   Days of Exercise per Week: 0 days   Minutes of Exercise per Session: 0 min  Social Connections: Socially Isolated   Frequency of Communication with Friends and Family: Three times a week   Frequency of Social Gatherings with Friends and Family: Three times a week   Attends Religious Services: Never   Active Member of Clubs or Organizations: No   Attends Archivist Meetings: Never   Marital Status: Widowed  Stress: No Stress Concern Present   Feeling of Stress : Not at all  Tobacco Use: Low Risk    Smoking Tobacco Use: Never   Smokeless Tobacco Use: Never   Passive Exposure: Not on file  Transportation Needs: No Transportation Needs   Lack of Transportation (Medical): No   Lack of Transportation (Non-Medical): No    CCM Care Plan  Allergies  Allergen Reactions   Amoxicillin     REACTION: rash   Codeine    Lipitor [Atorvastatin]     Muscle pain, stiff joints   Lisinopril    Nitrofurantoin    Tramadol Anxiety    Sedation, worsening depression.     Medications Reviewed Today     Reviewed by Viona Gilmore, [facility] (Pharmacist) on 06/01/21 at [place] List Status: <None>   Medication Order Taking? Sig Documenting Provider Last Dose Status Informant   acetaminophen (TYLENOL) 325 MG tablet [id number] Yes Take 325 mg by mouth 2 (two) times daily. [provider] Taking Active Child  apixaban (ELIQUIS) 2.5 MG TABS tablet [id number] Yes Take 1 tablet (2.5 mg total) by mouth 2 (two) times daily. Almyra Deforest, [place] Taking Active   atenolol (TENORMIN) 50 MG tablet [id number] Yes Take 1 tablet (50 mg total) by mouth daily.  Patient taking differently: Take 100 mg by mouth daily.   Laurey Morale, MD Taking Active            Med Note Kipp Brood, Karry Barrilleaux G   Wed Jun 01, 2021  5:04 PM)    docusate sodium (COLACE) 100 MG capsule [id number] Yes Take 100 mg by mouth 2 (two) times daily. [provider] Taking Active   ferrous sulfate 325 (65 FE) MG tablet [id number] Yes Take 1 tablet (325 mg total) by mouth every other day.  Patient taking differently: Take 325 mg by mouth 2 (two) times a week.   Lavina Hamman, MD Taking Active Self  levETIRAcetam (KEPPRA) 500 MG tablet [id number] Yes Take 1 tablet (500 mg total) by mouth 2 (two) times daily. Noemi Chapel, MD Taking Active   mirtazapine (REMERON) 15 MG tablet [id number] Yes TAKE 1 TABLET BY MOUTH EVERYDAY AT BEDTIME  Patient taking differently: Take 7.5 mg by mouth at bedtime.   Laurey Morale, MD Taking Active   pantoprazole (PROTONIX) 40 MG tablet [id number] Yes Take 1 tablet (40 mg total) by mouth daily. Laurey Morale, MD Taking Active   polyethylene glycol (MIRALAX / GLYCOLAX) 17 g packet [id number] Yes Take 17 g by mouth daily.  Patient taking differently: Take 17 g by mouth as needed.   Harold Hedge, MD Taking Active   simvastatin (  ZOCOR) 20 MG tablet [id number] Yes TAKE 1 TABLET BY MOUTH EVERYDAY AT BEDTIME  Patient taking differently: Take 20 mg by mouth every evening.   Laurey Morale, MD Taking Active   Vitamin D, Ergocalciferol, (DRISDOL) 1.25 MG (50000 UNIT) CAPS capsule [id number] Yes Take 1 capsule (50,000 Units total) by mouth every 7 (seven) days. 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   Cerebrovascular accident (CVA) ([place])    Embolic stroke ([place]) 80/32/1224   Facial droop 08/18/2016   HYPERGLYCEMIA 06/30/2010   Cerebral artery occlusion with cerebral infarction ([place]) 07/19/2009   Disorder resulting from impaired renal function 07/19/2009   Polymyalgia rheumatica ([place]) 07/19/2009   B12 deficiency 06/30/2009   Anxiety state 06/28/2009   Hereditary and idiopathic peripheral neuropathy 06/28/2009   External hemorrhoids 12/03/2008   ESOPHAGEAL STRICTURE 11/30/2008   Diverticulosis of colon 11/30/2008   GROSS HEMATURIA 11/04/2008   MYALGIA 11/04/2008   Constipation 06/24/2008   HLD (hyperlipidemia) 05/30/2007   SKIN LESION 05/30/2007   Essential hypertension 03/19/2007   GERD 03/19/2007    Immunization History  Administered Date(s) Administered   Fluad Quad(high Dose 65+) 07/02/2019, 06/01/2021   Influenza Split 07/03/2012   Influenza Whole 04/30/2006, 05/30/2007, 04/07/2010   Influenza, High Dose Seasonal PF 05/19/2015, 05/02/2017, 04/17/2018   Influenza,inj,Quad PF,6+ Mos 05/09/2013, 07/28/2014   Influenza-Unspecified 05/26/2016, 05/02/2017   PFIZER(Purple Top)SARS-COV-2 Vaccination 12/31/2019, 01/21/2020   Pneumococcal Conjugate-13 08/05/2015   Pneumococcal Polysaccharide-23 07/31/2000, 05/30/2007   Tdap 10/02/2018    Conditions to be addressed/monitored:  Hypertension, Hyperlipidemia, Atrial Fibrillation, Osteoporosis, and vitamin D deficiency, anemia,  insomnia  Conditions addressed this visit: Hypertension, hyperlipidemia  Care Plan : [place]  Updates made by Viona Gilmore, [place] since 06/01/2021 12:00 AM     Problem: Problem: Hypertension, Hyperlipidemia, Atrial Fibrillation, Osteoporosis, and vitamin D deficiency, anemia, insomnia      Long-Range Goal: Patient-Specific Goal   Start Date: 06/01/2021  Expected End Date: 06/01/2022  This Visit's Progress: On track  Priority: High  Note:   Current Barriers:  Unable to independently monitor therapeutic efficacy  Pharmacist Clinical Goal(s):  Patient will achieve adherence to monitoring guidelines and medication adherence to achieve therapeutic efficacy through collaboration with PharmD and provider.   Interventions: 1:1 collaboration with Laurey Morale, MD regarding development and update of comprehensive plan of care as evidenced by provider attestation and co-signature Inter-disciplinary care team collaboration (see longitudinal plan of care) Comprehensive medication review performed; medication list updated in electronic medical record  Hypertension (BP goal <140/90) -Not ideally controlled -Current treatment: Atenolol 50 mg daily -Medications previously tried: losartan, amlodipine (low BP)  -Current home readings: 139/100, 137/86, 143/84, 141/87 140/86 -Current dietary habits: did not discuss -Current exercise habits: walks with walker -Denies hypotensive/hypertensive symptoms -Educated on BP goals and benefits of medications for prevention of heart attack, stroke and kidney damage; Importance of home blood pressure monitoring; Proper BP monitoring technique; -Counseled to monitor BP at home  weekly, document, and provide log at future appointments -Counseled on diet and exercise extensively Recommended to continue current medication Requested updated prescription for atenolol to be sent to the pharmacy.  Hyperlipidemia: (LDL goal <  70) -Controlled -Current treatment: Simvastatin 20 mg 1 tablet daily -Medications previously tried: none  -Current dietary patterns: did not discuss -Current exercise habits: did not discuss -Educated on Cholesterol goals;  -Collaborated with PCP about considering stopping simvastatin.  Atrial Fibrillation (Goal: prevent stroke and major bleeding) -Not ideally controlled -CHADSVASC: 6 -Current treatment: Rate control:  Atenolol 50 mg daily Anticoagulation: Eliquis 2.5 mg 1 tablet twice daily -Medications previously tried: none -Home BP and HR readings: pulse is running in the 70-80s range  -Counseled on increased risk of stroke due to Afib and benefits of anticoagulation for stroke prevention; -Counseled on diet and exercise extensively Recommended to continue current medication Recommended discussing with cardiology about possibly increasing atenolol based on Afib frequency.   Vitamin D deficiency (Goal: vitamin D 30-100) -Uncontrolled -Current treatment  Vitamin D 50,000 units once weekly -Medications previously tried: none  -Recommended taking with food.  GERD/Barrett's esophagus (Goal: minimize symptoms) -Controlled -Current treatment  Pantoprazole 40 mg 1 tablet daily -Medications previously tried: none  -Recommended to continue current medication Counseled on benefits of PPIs for Barrett's esophagus.  Possible seizure (Goal: prevent seizures) -Controlled -Current treatment  Levetiracetam 250 mg 1 tablet twice daily - took 125 mg BID recently -Medications previously tried: none  -Recommended discussing with neurology about stopping if not in fact a seizure.   Anemia (Goal: prevent bleeding) -Controlled -Current treatment  Ferrous sulfate 325 mg weekly -Medications previously tried: none  -Recommended trial of ferrous gluconate to help with GI side effects.  Insomnia (Goal: improve quality and quantity of sleep) -Controlled -Current treatment  Mirtazapine  15 mg 1/2 tablet daily -Medications previously tried: none  -Recommended to continue current medication Requested updated prescription with correct directions.  Health Maintenance -Vaccine gaps: shingrix, COVID booster -Current therapy:  Miralax as needed Docusate 100 mg twice daily -Educated on Cost vs benefit of each product must be carefully weighed by individual consumer -Patient is satisfied with current therapy and denies issues -Recommended to continue current medication  Patient Goals/Self-Care Activities Patient will:  - take medications as prescribed as evidenced by patient report and record review check blood pressure weekly, document, and provide at future appointments  Follow Up Plan: The care management team will reach out to the patient again over the next 30 days.        Medication Assistance: None required.  Patient affirms current coverage meets needs.  Compliance/Adherence/Medication fill history: Care Gaps: Influenza, COVID booster, shingrix Dexa scan - never done  Star-Rating Drugs: Simvastatin 72m - last [facility] on 05/17/21 [facility]  [facility]  [id number] [place] [id number]: [phone]: [phone] pill box? Yes Pt endorses 90% compliance  We discussed: Current pharmacy is preferred with insurance plan and patient is satisfied with pharmacy services Patient decided to: Continue current medication management strategy  Care Plan and Follow Up Patient Decision:  Patient agrees to Care Plan and Follow-up.  Plan: The care management team will reach out to the patient again over the next 30 days.  MaJeni SallesPharmD, [facility] [place]

## 2021-06-06 ENCOUNTER — Other Ambulatory Visit: Payer: Self-pay

## 2021-06-06 MED ORDER — MIRTAZAPINE 15 MG PO TABS: ORAL_TABLET | ORAL | 1 refills | Status: DC

## 2021-06-06 MED ORDER — ATENOLOL 50 MG PO TABS: 50.0000 mg | ORAL_TABLET | Freq: Every day | ORAL | 1 refills | Status: DC

## 2021-06-07 ENCOUNTER — Telehealth: Payer: Self-pay | Admitting: Pharmacist

## 2021-06-07 NOTE — Telephone Encounter (Signed)
Called patient's daughter who manages patients medications to follow up on below recommendations. Patient's daughter will have patient stop taking the simvastatin and medication was removed from list.

## 2021-06-07 NOTE — Telephone Encounter (Signed)
-----   Message from Laurey Morale, MD sent at 06/06/2021 10:55 AM EST ----- Regarding: RE: Statin question Yes, please stop the Simvastatin   ----- Message ----- From: Viona Gilmore, [facility] Sent: 06/01/2021   5:19 PM EST To: Laurey Morale, MD Subject: Statin question                                Hi,  Hope you enjoyed your time off!  [name] inquired about stopping any of her medications as she feels like she is taking too many. Her daughter specifically inquired about the simvastatin. I know she has a history of strokes but given her age, would you be ok with stopping it? Her previous LDL was very well controlled so I wouldn't anticipate a huge bump up in her number if it is stopped.  Let me know what you think! Christine Middleton

## 2021-06-27 NOTE — Progress Notes (Signed)
Cardiology Office Note:    Date:  06/28/2021   ID:  Christine Middleton, DOB 07-Jan-1923, MRN [id number]  PCP:  Laurey Morale, MD [place] Cardiologist: Peter Martinique, MD   Reason for visit: Overdue 1 year follow-up  History of Present Illness:    Christine Middleton is a 87 y.o. female with a hx of  HTN, HLD, CVA and rate controlled afib.  Patient was admitted in January 2018 with CVA along with left facial droop.  Echocardiogram obtained on 08/18/2016 showed EF of 60 to 65%, no regional wall motion abnormality, grade 2 DD.  Carotid Doppler in January 2018 was negative for significant disease.  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  Cardiology service was consulted for TEE plus loop recorder, however patient declined this.  She was discharged on Plavix therapy.  However on subsequent follow-up with Dr. Sarajane Jews, she was noted to be in A. fib with controlled heart rate.  She was anticoagulated with Eliquis.  She was last admitted in December 2020 due to some bleeding hemorrhoid.  Colonoscopy also revealed diverticulosis in the rectum and a 5 mm polyp which was removed.  Eliquis was held during perioperative period however restarted 2 days after the colonoscopy.   Last saw Almyra Deforest in April 2021.  She was in normal sinus rhythm.  He recommended she move her losartan nighttime to allow for more even blood pressure control.    Today, she states she is feeling pretty well.  She complains of old age.  She lives with her youngest son.  She is still able to perform her ADLs and do some housework and occasional cooking.  Since she fell earlier this year, she now uses a walker often.  She denies chest pain, shortness of breath, PND, orthopnea, edema and palpitations.  Occasional lightheadedness.  She has had some weight loss after her fall.  She states she had low blood pressure with her prior anemia, therefore is on less blood  pressure medications now.  Before she is on losartan 100 mg daily, amlodipine 5 mg daily and atenolol 100 mg daily.  Dr. Sarajane Jews, her PCP, stated blood pressures up to 150/100 were okay for her.  They mentioned that her PCP is trying to decrease her medication list and stopped her statin.  They bring blood pressure log: BP 145/102, 140/83, 167/120, 153/107, 183/115, 156/108, 147/95, 152/107, 164/102.  Her daughter mentions that patient has been talking about death recently so she is not sure if that has increased recent blood pressures.        Past Medical History:  Diagnosis Date   Diverticulitis    Esophageal stricture    GERD (gastroesophageal reflux disease)    Hyperlipidemia    Hypertension    Shingles    Stroke [facility])    Vitamin B 12 deficiency     Past Surgical History:  Procedure Laterality Date   ABDOMINAL HYSTERECTOMY     APPENDECTOMY     COLONOSCOPY  12-09-08   per Dr. Sharlett Iles (incomplete along with barium enema 12-10-08) with hemorrhoids  and diverticulae   COLONOSCOPY WITH PROPOFOL N/A 07/10/2019   Procedure: COLONOSCOPY WITH PROPOFOL;  Surgeon: Irving Copas., MD;  Location: [facility];  Service: Gastroenterology;  Laterality: N/A;   ESOPHAGOGASTRODUODENOSCOPY  09-2004   per Dr. Sharlett Iles with dilatation    EYE SURGERY     HEMOSTASIS CLIP PLACEMENT  07/10/2019   Procedure: HEMOSTASIS  CLIP PLACEMENT;  Surgeon: Mansouraty, Telford Nab., MD;  Location: [facility];  Service: Gastroenterology;;   POLYPECTOMY  07/10/2019   Procedure: POLYPECTOMY;  Surgeon: Irving Copas., MD;  Location: [facility];  Service: Gastroenterology;;   TONSILLECTOMY      Current Medications: Current Meds  Medication Sig   atenolol (TENORMIN) 50 MG tablet Take 1 tablet (50 mg total) by mouth daily.   ferrous sulfate 325 (65 FE) MG tablet Take 1 tablet (325 mg total) by mouth every other day. (Patient taking differently: Take 325 mg by mouth 2 (two) times a week.)    levETIRAcetam (KEPPRA) 250 MG tablet Take 250 mg by mouth 2 (two) times daily. TAKING 1/2 TAB   losartan (COZAAR) 25 MG tablet Take 1 tablet (25 mg total) by mouth daily. In the Evening   mirtazapine (REMERON) 15 MG tablet Take 1/2 (7.5 mg) tablet by mouth at bedtime   pantoprazole (PROTONIX) 40 MG tablet Take 1 tablet (40 mg total) by mouth daily.   polyethylene glycol (MIRALAX / GLYCOLAX) 17 g packet Take 17 g by mouth daily. (Patient taking differently: Take 17 g by mouth as needed.)   Vitamin D, Ergocalciferol, (DRISDOL) 1.25 MG (50000 UNIT) CAPS capsule Take 1 capsule (50,000 Units total) by mouth every 7 (seven) days.   [DISCONTINUED] apixaban (ELIQUIS) 2.5 MG TABS tablet Take 1 tablet (2.5 mg total) by mouth 2 (two) times daily.   [DISCONTINUED] levETIRAcetam (KEPPRA) 500 MG tablet Take 1 tablet (500 mg total) by mouth 2 (two) times daily.     Allergies:   Amoxicillin, Codeine, Lipitor [atorvastatin], Lisinopril, Nitrofurantoin, and Tramadol   Social History   Socioeconomic History   Marital status: Widowed    Spouse name: Grayling Congress. Yasui   Number of children: 4   Years of education: HS   Highest education level: Not on file  Occupational History   Occupation: Retired    Fish farm manager: RETIRED  Tobacco Use   Smoking status: Never   Smokeless tobacco: Never  Vaping Use   Vaping Use: Never used  Substance and Sexual Activity   Alcohol use: No    Alcohol/week: 0.0 standard drinks   Drug use: No   Sexual activity: Not on file  Other Topics Concern   Not on file  Social History Narrative   Patient lives at home with family.   Patient is right handed.   Patient has a high school education.   Caffeine Use: 1 cup daily   Social Determinants of Health   Financial Resource Strain: Low Risk    Difficulty of Paying Living Expenses: Not very hard  Food Insecurity: No Food Insecurity   Worried About Charity fundraiser in the Last Year: Never true   Ran Out of Food in the Last  Year: Never true  Transportation Needs: No Transportation Needs   Lack of Transportation (Medical): No   Lack of Transportation (Non-Medical): No  Physical Activity: Inactive   Days of Exercise per Week: 0 days   Minutes of Exercise per Session: 0 min  Stress: No Stress Concern Present   Feeling of Stress : Not at all  Social Connections: Socially Isolated   Frequency of Communication with Friends and Family: Three times a week   Frequency of Social Gatherings with Friends and Family: Three times a week   Attends Religious Services: Never   Active Member of Clubs or Organizations: No   Attends Archivist Meetings: Never   Marital Status: Widowed  Family History: The patient's family history includes Breast cancer in an other family member; Cancer in her brother and brother; Diabetes in her brother and another family member; Heart attack in her mother; Heart disease in her father and another family member; Hypertension in an other family member; Stroke in her sister and another family member.  ROS:   Please see the history of present illness.     EKGs/Labs/Other Studies Reviewed:    EKG:  The ekg ordered today demonstrates atrial fibrillation, left axis deviation, anterior septal infarct (no change), heart rate 95, QRS duration 90 ms, QT 320 ms.  Recent Labs: 10/22/2020: TSH 0.66 11/08/2020: Magnesium 1.8 05/02/2021: ALT 10; BUN 17; Creatinine 0.81; Hemoglobin 13.0; Platelet Count 198; Potassium 4.2; Sodium 143   Recent Lipid Panel Lab Results  Component Value Date/Time   CHOL 153 11/08/2020 10:46 AM   TRIG 135.0 11/08/2020 10:46 AM   TRIG 92 05/28/2006 11:04 AM   HDL 61.90 11/08/2020 10:46 AM   LDLCALC 64 11/08/2020 10:46 AM   LDLDIRECT 113.3 07/18/2012 11:13 AM    Physical Exam:    VS:  BP 138/80 (BP Location: Left Arm, Patient Position: Sitting, Cuff Size: Normal)   Pulse 95   Resp 20   Ht 5\' 2"  (1.575 m)   Wt 124 lb 6.4 oz (56.4 kg)   SpO2 99%   BMI  22.75 kg/m    No data found.  Wt Readings from Last 3 Encounters:  06/28/21 124 lb 6.4 oz (56.4 kg)  05/02/21 125 lb 3.2 oz (56.8 kg)  02/14/21 121 lb (54.9 kg)     GEN:  elderly appearing, no acute distress HEENT: Normal NECK: No JVD; No carotid bruits CARDIAC: irreg irreg RESPIRATORY:  Clear to auscultation without rales, wheezing or rhonchi  ABDOMEN: Soft, non-tender, non-distended MUSCULOSKELETAL: No edema; No deformity  SKIN: Warm and dry NEUROLOGIC:  Alert and oriented PSYCHIATRIC:  Normal affect     ASSESSMENT AND PLAN   Permanent atrial fibrillation, rate controlled History of CVA (2/2 A-fib) -Continue atenolol for rate control and Eliquis for stroke prevention.  Refilled low dose Eliquis today (given age & hemorrhoid bleeding hx).  No bleeding issues recently.    Hypertension, BP elevated -Do think it is reasonable to restart losartan at 25 mg at nighttime (prev on losartan 100mg ).  Good blood pressure goal for her is 140s to 150s.  Advised to call if any significant dizziness. -Continue atenolol 50 mg daily -They mention edema with amlodipine in the past so prefer to add losartan back vs. amlodipine.  Normal renal function & K in October.   Disposition - Follow-up in 1 year.        Medication Adjustments/Labs and Tests Ordered: Current medicines are reviewed at length with the patient today.  Concerns regarding medicines are outlined above.  Orders Placed This Encounter  Procedures   EKG 12-Lead   Meds ordered this encounter  Medications   apixaban (ELIQUIS) 2.5 MG TABS tablet    Sig: Take 1 tablet (2.5 mg total) by mouth 2 (two) times daily.    Dispense:  180 tablet    Refill:  1   losartan (COZAAR) 25 MG tablet    Sig: Take 1 tablet (25 mg total) by mouth daily. In the Evening    Dispense:  90 tablet    Refill:  3    Patient Instructions  Medication Instructions:  Start Losartan 25 mg ( Take 1 Tablet Daily In The Evening). *If you need a  refill  on your cardiac medications before your next appointment, please call your pharmacy*   Lab Work: No Labs If you have labs (blood work) drawn today and your tests are completely normal, you will receive your results only by: [place] (if you have MyChart) OR A paper copy in the mail If you have any lab test that is abnormal or we need to change your treatment, we will call you to review the results.   Testing/Procedures: No Testing    Follow-Up: At [facility], you and your health needs are our priority.  As part of our continuing mission to provide you with exceptional heart care, we have created designated Provider Care Teams.  These Care Teams include your primary Cardiologist (physician) and Advanced Practice Providers (APPs -  Physician Assistants and Nurse Practitioners) who all work together to provide you with the care you need, when you need it.  We recommend signing up for the patient portal called "MyChart".  Sign up information is provided on this After Visit Summary.  MyChart is used to connect with patients for Virtual Visits (Telemedicine).  Patients are able to view lab/test results, encounter notes, upcoming appointments, etc.  Non-urgent messages can be sent to your provider as well.   To learn more about what you can do with MyChart, go to [email or web address].    Your next appointment:   1 year(s)  The format for your next appointment:   In Person  Provider:   Peter Martinique, MD     Other Instructions Blood Pressure Goal 130-140. Call office if lightheadedness occurs or worsens.    Signed, Warren Lacy, PA-C  06/28/2021 11:42 AM    [facility]

## 2021-06-28 ENCOUNTER — Ambulatory Visit: Payer: Medicare Other | Admitting: Physician Assistant

## 2021-06-28 ENCOUNTER — Other Ambulatory Visit: Payer: Self-pay

## 2021-06-28 ENCOUNTER — Encounter: Payer: Self-pay | Admitting: Physician Assistant

## 2021-06-28 VITALS — BP 138/80 | HR 95 | Resp 20 | Ht 62.0 in | Wt 124.4 lb

## 2021-06-28 DIAGNOSIS — I480 Paroxysmal atrial fibrillation: Secondary | ICD-10-CM | POA: Diagnosis not present

## 2021-06-28 DIAGNOSIS — I10 Essential (primary) hypertension: Secondary | ICD-10-CM

## 2021-06-28 MED ORDER — LOSARTAN POTASSIUM 25 MG PO TABS: 25.0000 mg | ORAL_TABLET | Freq: Every day | ORAL | 3 refills | Status: DC

## 2021-06-28 MED ORDER — APIXABAN 2.5 MG PO TABS: 2.5000 mg | ORAL_TABLET | Freq: Two times a day (BID) | ORAL | 1 refills | Status: DC

## 2021-06-28 NOTE — Patient Instructions (Signed)
Medication Instructions:  Start Losartan 25 mg ( Take 1 Tablet Daily In The Evening). *If you need a refill on your cardiac medications before your next appointment, please call your pharmacy*   Lab Work: No Labs If you have labs (blood work) drawn today and your tests are completely normal, you will receive your results only by: [place] (if you have MyChart) OR A paper copy in the mail If you have any lab test that is abnormal or we need to change your treatment, we will call you to review the results.   Testing/Procedures: No Testing    Follow-Up: At [facility], you and your health needs are our priority.  As part of our continuing mission to provide you with exceptional heart care, we have created designated Provider Care Teams.  These Care Teams include your primary Cardiologist (physician) and Advanced Practice Providers (APPs -  Physician Assistants and Nurse Practitioners) who all work together to provide you with the care you need, when you need it.  We recommend signing up for the patient portal called "MyChart".  Sign up information is provided on this After Visit Summary.  MyChart is used to connect with patients for Virtual Visits (Telemedicine).  Patients are able to view lab/test results, encounter notes, upcoming appointments, etc.  Non-urgent messages can be sent to your provider as well.   To learn more about what you can do with MyChart, go to [email or web address].    Your next appointment:   1 year(s)  The format for your next appointment:   In Person  Provider:   Peter Martinique, MD     Other Instructions Blood Pressure Goal 130-140. Call office if lightheadedness occurs or worsens.

## 2021-06-29 DIAGNOSIS — I10 Essential (primary) hypertension: Secondary | ICD-10-CM | POA: Diagnosis not present

## 2021-06-29 DIAGNOSIS — E782 Mixed hyperlipidemia: Secondary | ICD-10-CM

## 2021-07-29 ENCOUNTER — Other Ambulatory Visit: Payer: Self-pay | Admitting: *Deleted

## 2021-07-29 DIAGNOSIS — D500 Iron deficiency anemia secondary to blood loss (chronic): Secondary | ICD-10-CM

## 2021-08-02 ENCOUNTER — Other Ambulatory Visit: Payer: Self-pay

## 2021-08-02 ENCOUNTER — Inpatient Hospital Stay: Payer: Medicare Other | Attending: Hematology

## 2021-08-02 DIAGNOSIS — D509 Iron deficiency anemia, unspecified: Secondary | ICD-10-CM | POA: Diagnosis not present

## 2021-08-02 DIAGNOSIS — D500 Iron deficiency anemia secondary to blood loss (chronic): Secondary | ICD-10-CM

## 2021-08-02 LAB — CBC WITH DIFFERENTIAL (CANCER CENTER ONLY)
Abs Immature Granulocytes: 0.01 10*3/uL (ref 0.00–0.07)
Basophils Absolute: 0.1 10*3/uL (ref 0.0–0.1)
Basophils Relative: 1 %
Eosinophils Absolute: 0.3 10*3/uL (ref 0.0–0.5)
Eosinophils Relative: 5 %
HCT: 39.7 % (ref 36.0–46.0)
Hemoglobin: 13.4 g/dL (ref 12.0–15.0)
Immature Granulocytes: 0 %
Lymphocytes Relative: 16 %
Lymphs Abs: 1.1 10*3/uL (ref 0.7–4.0)
MCH: 31.7 pg (ref 26.0–34.0)
MCHC: 33.8 g/dL (ref 30.0–36.0)
MCV: 93.9 fL (ref 80.0–100.0)
Monocytes Absolute: 0.7 10*3/uL (ref 0.1–1.0)
Monocytes Relative: 11 %
Neutro Abs: 4.3 10*3/uL (ref 1.7–7.7)
Neutrophils Relative %: 67 %
Platelet Count: 214 10*3/uL (ref 150–400)
RBC: 4.23 MIL/uL (ref 3.87–5.11)
RDW: 12.6 % (ref 11.5–15.5)
WBC Count: 6.5 10*3/uL (ref 4.0–10.5)
nRBC: 0 % (ref 0.0–0.2)

## 2021-08-02 LAB — FERRITIN: Ferritin: 186 ng/mL (ref 11–307)

## 2021-08-02 LAB — IRON AND IRON BINDING CAPACITY (CC-WL,HP ONLY)
Iron: 147 ug/dL (ref 28–170)
Saturation Ratios: 52 % — ABNORMAL HIGH (ref 10.4–31.8)
TIBC: 286 ug/dL (ref 250–450)
UIBC: 139 ug/dL — ABNORMAL LOW (ref 148–442)

## 2021-09-24 ENCOUNTER — Other Ambulatory Visit: Payer: Self-pay | Admitting: Family Medicine

## 2021-10-31 ENCOUNTER — Inpatient Hospital Stay: Payer: Medicare Other

## 2021-11-09 ENCOUNTER — Encounter: Payer: Self-pay | Admitting: Family Medicine

## 2021-11-09 ENCOUNTER — Ambulatory Visit (INDEPENDENT_AMBULATORY_CARE_PROVIDER_SITE_OTHER): Payer: Medicare Other | Admitting: Family Medicine

## 2021-11-09 VITALS — BP 126/82 | HR 66 | Temp 97.7°F | Ht 62.0 in | Wt 132.0 lb

## 2021-11-09 DIAGNOSIS — I10 Essential (primary) hypertension: Secondary | ICD-10-CM | POA: Diagnosis not present

## 2021-11-09 DIAGNOSIS — R739 Hyperglycemia, unspecified: Secondary | ICD-10-CM

## 2021-11-09 DIAGNOSIS — I480 Paroxysmal atrial fibrillation: Secondary | ICD-10-CM | POA: Diagnosis not present

## 2021-11-09 DIAGNOSIS — K5900 Constipation, unspecified: Secondary | ICD-10-CM

## 2021-11-09 DIAGNOSIS — E538 Deficiency of other specified B group vitamins: Secondary | ICD-10-CM | POA: Diagnosis not present

## 2021-11-09 DIAGNOSIS — D500 Iron deficiency anemia secondary to blood loss (chronic): Secondary | ICD-10-CM

## 2021-11-09 DIAGNOSIS — F411 Generalized anxiety disorder: Secondary | ICD-10-CM | POA: Diagnosis not present

## 2021-11-09 DIAGNOSIS — I6350 Cerebral infarction due to unspecified occlusion or stenosis of unspecified cerebral artery: Secondary | ICD-10-CM | POA: Diagnosis not present

## 2021-11-09 DIAGNOSIS — E559 Vitamin D deficiency, unspecified: Secondary | ICD-10-CM

## 2021-11-09 DIAGNOSIS — M353 Polymyalgia rheumatica: Secondary | ICD-10-CM

## 2021-11-09 LAB — BASIC METABOLIC PANEL
BUN: 18 mg/dL (ref 6–23)
CO2: 30 mEq/L (ref 19–32)
Calcium: 9.8 mg/dL (ref 8.4–10.5)
Chloride: 101 mEq/L (ref 96–112)
Creatinine, Ser: 0.94 mg/dL (ref 0.40–1.20)
GFR: 50.94 mL/min — ABNORMAL LOW (ref >60.00)
Glucose, Bld: 103 mg/dL — ABNORMAL HIGH (ref 70–99)
Potassium: 4.5 mEq/L (ref 3.5–5.1)
Sodium: 140 mEq/L (ref 135–145)

## 2021-11-09 LAB — CBC WITH DIFFERENTIAL/PLATELET
Basophils Absolute: 0.1 10*3/uL (ref 0.0–0.1)
Basophils Relative: 1 % (ref 0.0–3.0)
Eosinophils Absolute: 0.4 10*3/uL (ref 0.0–0.7)
Eosinophils Relative: 4.8 % (ref 0.0–5.0)
HCT: 39.5 % (ref 36.0–46.0)
Hemoglobin: 13.4 g/dL (ref 12.0–15.0)
Lymphocytes Relative: 17.3 % (ref 12.0–46.0)
Lymphs Abs: 1.4 10*3/uL (ref 0.7–4.0)
MCHC: 34 g/dL (ref 30.0–36.0)
MCV: 95.5 fl (ref 78.0–100.0)
Monocytes Absolute: 0.8 10*3/uL (ref 0.1–1.0)
Monocytes Relative: 9.3 % (ref 3.0–12.0)
Neutro Abs: 5.6 10*3/uL (ref 1.4–7.7)
Neutrophils Relative %: 67.6 % (ref 43.0–77.0)
Platelets: 243 10*3/uL (ref 150.0–400.0)
RBC: 4.14 Mil/uL (ref 3.87–5.11)
RDW: 12.5 % (ref 11.5–15.5)
WBC: 8.3 10*3/uL (ref 4.0–10.5)

## 2021-11-09 LAB — IBC + FERRITIN
Ferritin: 166.5 ng/mL (ref 10.0–291.0)
Iron: 123 ug/dL (ref 42–145)
Saturation Ratios: 38.7 % (ref 20.0–50.0)
TIBC: 317.8 ug/dL (ref 250.0–450.0)
Transferrin: 227 mg/dL (ref 212.0–360.0)

## 2021-11-09 LAB — HEPATIC FUNCTION PANEL
ALT: 19 U/L (ref 0–35)
AST: 23 U/L (ref 0–37)
Albumin: 4.2 g/dL (ref 3.5–5.2)
Alkaline Phosphatase: 131 U/L — ABNORMAL HIGH (ref 39–117)
Bilirubin, Direct: 0.1 mg/dL (ref 0.0–0.3)
Total Bilirubin: 0.8 mg/dL (ref 0.2–1.2)
Total Protein: 7.5 g/dL (ref 6.0–8.3)

## 2021-11-09 LAB — IRON: Iron: 123 ug/dL (ref 42–145)

## 2021-11-09 LAB — LIPID PANEL
Cholesterol: 254 mg/dL — ABNORMAL HIGH (ref 0–200)
HDL: 86.3 mg/dL (ref >39.00)
LDL Cholesterol: 145 mg/dL — ABNORMAL HIGH (ref 0–99)
NonHDL: 167.97
Total CHOL/HDL Ratio: 3
Triglycerides: 117 mg/dL (ref 0.0–149.0)
VLDL: 23.4 mg/dL (ref 0.0–40.0)

## 2021-11-09 LAB — TSH: TSH: 1.39 u[IU]/mL (ref 0.35–5.50)

## 2021-11-09 LAB — HEMOGLOBIN A1C: Hgb A1c MFr Bld: 5.7 % (ref 4.6–6.5)

## 2021-11-09 LAB — VITAMIN B12: Vitamin B-12: 256 pg/mL (ref 211–911)

## 2021-11-09 MED ORDER — VITAMIN D (ERGOCALCIFEROL) 1.25 MG (50000 UNIT) PO CAPS: 50000.0000 [IU] | ORAL_CAPSULE | ORAL | 3 refills | Status: DC

## 2021-11-09 MED ORDER — MIRTAZAPINE 15 MG PO TABS: ORAL_TABLET | ORAL | 3 refills | Status: DC

## 2021-11-09 MED ORDER — PANTOPRAZOLE SODIUM 40 MG PO TBEC: 40.0000 mg | DELAYED_RELEASE_TABLET | Freq: Every day | ORAL | 3 refills | Status: DC

## 2021-11-09 NOTE — Progress Notes (Signed)
? ?Subjective:  ? ? Patient ID: Samuel Jester Leyland, female    DOB: 1922/10/20, 88 y.o.   MRN: [id number] ? ?HPI ?Here with her daughter to follow up on issues. She feels good in general. Her appetite is strong. She is in atrial fibrillation most of the time, but this does not seem to bother her. Her BP and pulse rate have been stable. No evidence of any further neurologic deficits. No bleeding per rectum.  ? ? ?Review of Systems  ?Constitutional: Negative.   ?HENT: Negative.    ?Eyes: Negative.   ?Respiratory: Negative.    ?Cardiovascular: Negative.   ?Gastrointestinal: Negative.   ?Genitourinary:  Negative for decreased urine volume, difficulty urinating, dyspareunia, dysuria, enuresis, flank pain, frequency, hematuria, pelvic pain and urgency.  ?Musculoskeletal: Negative.   ?Skin: Negative.   ?Neurological: Negative.  Negative for headaches.  ?Psychiatric/Behavioral: Negative.    ? ?   ?Objective:  ? Physical Exam ?Constitutional:   ?   General: She is not in acute distress. ?   Appearance: Normal appearance. She is well-developed.  ?   Comments: Walks with a cane   ?HENT:  ?   Head: Normocephalic and atraumatic.  ?   Right Ear: External ear normal.  ?   Left Ear: External ear normal.  ?   Nose: Nose normal.  ?   Mouth/Throat:  ?   Pharynx: No oropharyngeal exudate.  ?Eyes:  ?   General: No scleral icterus. ?   Conjunctiva/sclera: Conjunctivae normal.  ?   Pupils: Pupils are equal, round, and reactive to light.  ?Neck:  ?   Thyroid: No thyromegaly.  ?   Vascular: No JVD.  ?Cardiovascular:  ?   Rate and Rhythm: Normal rate and regular rhythm.  ?   Heart sounds: Normal heart sounds. No murmur heard. ?  No friction rub. No gallop.  ?Pulmonary:  ?   Effort: Pulmonary effort is normal. No respiratory distress.  ?   Breath sounds: Normal breath sounds. No wheezing or rales.  ?Chest:  ?   Chest wall: No tenderness.  ?Abdominal:  ?   General: Bowel sounds are normal. There is no distension.  ?   Palpations: Abdomen is soft.  There is no mass.  ?   Tenderness: There is no abdominal tenderness. There is no guarding or rebound.  ?Musculoskeletal:     ?   General: No tenderness. Normal range of motion.  ?   Cervical back: Normal range of motion and neck supple.  ?Lymphadenopathy:  ?   Cervical: No cervical adenopathy.  ?Skin: ?   General: Skin is warm and dry.  ?   Findings: No erythema or rash.  ?Neurological:  ?   Mental Status: She is alert and oriented to person, place, and time.  ?   Cranial Nerves: No cranial nerve deficit.  ?   Motor: No abnormal muscle tone.  ?   Coordination: Coordination normal.  ?   Deep Tendon Reflexes: Reflexes are normal and symmetric. Reflexes normal.  ?Psychiatric:     ?   Behavior: Behavior normal.     ?   Thought Content: Thought content normal.     ?   Judgment: Judgment normal.  ? ? ? ? ? ?   ?Assessment & Plan:  ?She is doing well in general. Her atrial fibrillation and HTN are well controlled. Her iron deficiency anemia seems to be corrected, but we will check labs today for this. We will also checking fasting lipids,  glucose, etc. We spent a total of ( 33  ) minutes reviewing records and discussing these issues.  ?[name], MD ? ? ?

## 2021-11-21 ENCOUNTER — Telehealth: Payer: Self-pay | Admitting: Pharmacist

## 2021-11-21 NOTE — Chronic Care Management (AMB) (Signed)
? ? ?Chronic Care Management ?Pharmacy Assistant  ? ?Name: Christine Middleton  MRN: [id number] DOB: 1922-09-10 ? ?Reason for Encounter: Disease State / Gerneral Assessment Call ?  ?Conditions to be addressed/monitored: ?HTN and GERD ? ?Recent office visits:  ?11/09/2021 [name] MD - Patient was seen for Paroxysmal atrial fibrillation and additional issues. Discontinued Ferrous Sulfate and Keppra. No follow up noted.  ? ?Recent consult visits:  ?06/28/2021 Caron Presume PA-C [facility]) - Patient was seen for Paroxysmal atrial fibrillation and additional issues. Started Losartan 25 mg 1 tablet every evening. Decreased Keppra to 250 mg twice daily. Discontinued Tylenol and Colase. Follow up in 1 year.  ? ?Hospital visits:  ?None ? ?Medications: ?Outpatient Encounter Medications as of 11/21/2021  ?Medication Sig  ? apixaban (ELIQUIS) 2.5 MG TABS tablet Take 1 tablet (2.5 mg total) by mouth 2 (two) times daily.  ? atenolol (TENORMIN) 50 MG tablet Take 1 tablet (50 mg total) by mouth daily.  ? losartan (COZAAR) 25 MG tablet Take 1 tablet (25 mg total) by mouth daily. In the Evening  ? mirtazapine (REMERON) 15 MG tablet Take 1/2 (7.5 mg) tablet by mouth at bedtime  ? pantoprazole (PROTONIX) 40 MG tablet Take 1 tablet (40 mg total) by mouth daily.  ? polyethylene glycol (MIRALAX / GLYCOLAX) 17 g packet Take 17 g by mouth daily. (Patient taking differently: Take 17 g by mouth as needed.)  ? Vitamin D, Ergocalciferol, (DRISDOL) 1.25 MG (50000 UNIT) CAPS capsule Take 1 capsule (50,000 Units total) by mouth every 7 (seven) days.  ? ?No facility-administered encounter medications on file as of 11/21/2021.  ?Fill History: ?ELIQUIS 2.5 MG TABS 10/01/2021 90  ? ?ATENOLOL 50 MG TABLET 07/05/2021 90  ? ?VITAMIN D2 1.'25MG'$ (50,000 UNIT) 07/04/2021 84  ? ?LOSARTAN POTASSIUM 25 MG TABS 09/17/2021 90  ? ?MIRTAZAPINE 15 MG TABLET 09/03/2021 90  ? ?PANTOPRAZOLE SOD DR 40 MG TAB 09/03/2021 90  ? ?Contacted Patrisia B Sudduth for General Review  Call ? ? ?Chart Review: ? ?Have there been any documented new, changed, or discontinued medications since last visit? Yes  On 06/28/2021 Started Losartan 25 mg 1 tablet daily .  Decreased Keppra to 250 mg twice daily. Discontinued Tylenol and Colase.  ? ?Has there been any documented recent hospitalizations or ED visits since last visit with Clinical Pharmacist? No ? ? ?Adherence Review: ? ?Does the Clinical Pharmacist Assistant have access to adherence rates? Yes ? ?Adherence rates for STAR metric medications.  ?Losartan 25 mg daily ? ?Adherence rates for medications indicated for disease state being reviewed. ? ?Does the patient have >5 day gap between last estimated fill dates for any of the above medications or other medication gaps? No ? ? ?Disease State Questions: ? ?Able to connect with Patient? Yes ? ?Did patient have any problems with their health recently? Patient denies any problems with her health. ? ?Have you had any admissions or emergency room visits or worsening of your condition(s) since last visit? Patient denies any hospital admissions or ED visits.  ? ?Have you had any visits with new specialists or providers since your last visit? Patient denies any visits with new specialists. ? ?Have you had any new health care problem(s) since your last visit? Patient denies any new health care problems.  ? ?Have you run out of any of your medications since you last spoke with clinical pharmacist? Patient denies having run out of any medicines.  ? ?Are there any medications you are not taking as  prescribed? Patient denies, she is taking all her medications.  ? ?Are you having any issues or side effects with your medications? Patient denies any side effects from her medications.  ? ?Do you have any other health concerns or questions you want to discuss with your Clinical Pharmacist before your next visit? Patient denies any health concerns, she states she is doing and feeling good.  ? ?Are there any health  concerns that you feel we can do a better job addressing? Patient denies. ? ?Are you having any problems with any of the following since the last visit:  ? ?12. Any falls since last visit? Patient denies any falls.  ? ?13. Any increased or uncontrolled pain since last visit? Patient denies any pain.  ?  ?14. Next visit Type: telephone ?      Visit with: [name], Clinical Pharmacist ?       Date:05/02/2022 ?       Time: 11:00 AM ? ?15. Additional Details? No additional details  ? ?Care Gaps: ?AWV - completed 02/14/2021 ?Last BP - 126/82 on 11/09/2021 ?Shingrix - nover done ?Dexa scan - never done ?Covid booster - overdue ? ?Star Rating Drugs: ?Losartan 25 mg - last filled 09/17/2021 90 DS at [facility] ? ?[name] CMA  ?Clinical Pharmacist Assistant ?[phone] ? ?

## 2022-01-17 ENCOUNTER — Other Ambulatory Visit: Payer: Self-pay | Admitting: Physician Assistant

## 2022-01-17 NOTE — Telephone Encounter (Signed)
Prescription refill request for Eliquis received. Indication:Afib Last office visit:11/22 Scr:0.9 Age: 88 Weight:59.9 kg  Prescription refilled

## 2022-01-19 ENCOUNTER — Telehealth: Payer: Self-pay | Admitting: Cardiology

## 2022-01-19 NOTE — Telephone Encounter (Signed)
Spoke with daughter [name] regarding elevated home BP's Reports patient noticing BP elevated this week on Tuesday evening & Wednesday evening. Unsure of exact numbers but says systolic was around 116'F Reports monitor has not been checked for accuracy Reports checking BP as soon as she sit down and not waiting 5-10 minutes Reports checking BP's before medications and checking BP's at different times Reports using left arm and same monitor each time that BP is checked Advised to start checking home BP's with consistency for about 1 week and send readings to office through mychart Advised to use the same machine at the same time of day, waiting 5-10 minutes after sitting before checking BP Advised to get monitor checked for accuracy Verbalized understanding of plan

## 2022-01-19 NOTE — Telephone Encounter (Signed)
Pt c/o medication issue:  1. Name of Medication: losartan (COZAAR) 25 MG tablet  2. How are you currently taking this medication (dosage and times per day)? Take 1 tablet (25 mg total) by mouth daily. In the Evening   3. Are you having a reaction (difficulty breathing--STAT)?   4. What is your medication issue? Patient daughter called stating that her mother's BP has increased, it's been running in the range of 158-168 over 85-95. She is wondering if the losartan needs to be adjusted.

## 2022-02-01 ENCOUNTER — Encounter: Payer: Self-pay | Admitting: Cardiology

## 2022-02-03 ENCOUNTER — Other Ambulatory Visit: Payer: Self-pay

## 2022-02-03 DIAGNOSIS — D500 Iron deficiency anemia secondary to blood loss (chronic): Secondary | ICD-10-CM

## 2022-02-05 NOTE — Progress Notes (Unsigned)
[place]   [phone] [phone]   Clinic Follow up Note   Patient Care Team: Laurey Morale, MD as PCP - General [place], Peter M, MD as PCP - Cardiology (Cardiology) Viona Gilmore, [facility] as Pharmacist (Pharmacist)  Date of Service:  02/05/2022  CHIEF COMPLAINT: f/u of iron deficiency anemia  CURRENT THERAPY:  IV Venofer as needed  ASSESSMENT & PLAN:  Christine Middleton is a 89 y.o. female with   1.  Iron deficiency anemia, secondary to GI blood loss  -She has mild intermittent anemia Hg 9-12 since 2018, no other cytopenias. Primary bone marrow disorder is less likely  -Iron studies 10/22/20 show ferritin 6.5, 2% saturation ratio, serum iron <10, and Hg 7.5 on hospital admission. This is consistent with iron deficiency anemia likely from GI blood loss from hemorrhoids and severe diverticulosis (seen on last colonoscopy 07/2019).  -she received three doses of IV Venofer in 10/2020. With those and the hemorrhoid banding, her anemia has resolved. -we will continue to check her labs every 3-4 months.   2. A. fib, h/o CVA x2, HTN, HL -She sustained 2 strokes while on Plavix, she was found to have A. Fib -On Eliquis, which will increase her risk of bleeding  -Followed by PCP Dr. Sarajane Jews   4. Osteoporotic fracture  -CT 09/04/20 shows acute L1 fracture as well as L2 and L3 compression fractures.  -evaluated by neurosurgery, procedure not recommended -wears brace occasionally     PLAN: -continue oral iron, she can try liquid iron -labs every 3 months, IV Venofer if ferritin <30 -f/u in 9 months   No problem-specific Assessment & Plan notes found for this encounter.    INTERVAL HISTORY:  Christine Middleton is here for a follow up of anemia. She was last seen by NP Lacie on 11/01/20 in consultation. She presents to the clinic accompanied by daughter Christine Middleton. Christine Middleton reports Christine Middleton is taking oral iron twice a week due to vomiting. I recommend she try liquid  iron.   All other systems were reviewed with the patient and are negative.  MEDICAL HISTORY:  Past Medical History:  Diagnosis Date   Diverticulitis    Esophageal stricture    GERD (gastroesophageal reflux disease)    Hyperlipidemia    Hypertension    Shingles    Stroke ([place])    Vitamin B 12 deficiency     SURGICAL HISTORY: Past Surgical History:  Procedure Laterality Date   ABDOMINAL HYSTERECTOMY     APPENDECTOMY     COLONOSCOPY  12-09-08   per Dr. Sharlett Iles (incomplete along with barium enema 12-10-08) with hemorrhoids  and diverticulae   COLONOSCOPY WITH PROPOFOL N/A 07/10/2019   Procedure: COLONOSCOPY WITH PROPOFOL;  Surgeon: Irving Copas., MD;  Location: [facility];  Service: Gastroenterology;  Laterality: N/A;   ESOPHAGOGASTRODUODENOSCOPY  09-2004   per Dr. Sharlett Iles with dilatation    EYE SURGERY     HEMOSTASIS CLIP PLACEMENT  07/10/2019   Procedure: HEMOSTASIS CLIP PLACEMENT;  Surgeon: Irving Copas., MD;  Location: [facility];  Service: Gastroenterology;;   POLYPECTOMY  07/10/2019   Procedure: POLYPECTOMY;  Surgeon: Irving Copas., MD;  Location: [facility];  Service: Gastroenterology;;   TONSILLECTOMY      I have reviewed the social history and family history with the patient and they are unchanged from previous note.  ALLERGIES:  is allergic to amoxicillin, codeine, lipitor [atorvastatin], lisinopril, nitrofurantoin, and tramadol.  MEDICATIONS:  Current Outpatient Medications  Medication  Sig Dispense Refill   atenolol (TENORMIN) 50 MG tablet Take 1 tablet (50 mg total) by mouth daily. 90 tablet 1   ELIQUIS 2.5 MG TABS tablet TAKE 1 TABLET BY MOUTH TWICE A DAY 180 tablet 1   losartan (COZAAR) 25 MG tablet Take 1 tablet (25 mg total) by mouth daily. In the Evening 90 tablet 3   mirtazapine (REMERON) 15 MG tablet Take 1/2 (7.5 mg) tablet by mouth at bedtime 45 tablet 3   pantoprazole (PROTONIX) 40 MG tablet Take 1 tablet (40 mg  total) by mouth daily. 90 tablet 3   polyethylene glycol (MIRALAX / GLYCOLAX) 17 g packet Take 17 g by mouth daily. (Patient taking differently: Take 17 g by mouth as needed.) 14 each 0   Vitamin D, Ergocalciferol, (DRISDOL) 1.25 MG (50000 UNIT) CAPS capsule Take 1 capsule (50,000 Units total) by mouth every 7 (seven) days. 12 capsule 3   No current facility-administered medications for this visit.    PHYSICAL EXAMINATION: ECOG PERFORMANCE STATUS: 1 - Symptomatic but completely ambulatory  There were no vitals filed for this visit.  Wt Readings from Last 3 Encounters:  11/09/21 132 lb (59.9 kg)  06/28/21 124 lb 6.4 oz (56.4 kg)  05/02/21 125 lb 3.2 oz (56.8 kg)     GENERAL:alert, no distress and comfortable SKIN: skin color normal, no rashes or significant lesions EYES: normal, Conjunctiva are pink and non-injected, sclera clear  NEURO: alert & oriented x 3 with fluent speech  LABORATORY DATA:  I have reviewed the data as listed    Latest Ref Rng & Units 11/09/2021   10:39 AM 08/02/2021   10:28 AM 05/02/2021   11:27 AM  CBC  WBC 4.0 - 10.5 K/uL 8.3  6.5  5.9   Hemoglobin 12.0 - 15.0 g/dL 13.4  13.4  13.0   Hematocrit 36.0 - 46.0 % 39.5  39.7  38.5   Platelets 150.0 - 400.0 K/uL 243.0  214  198         Latest Ref Rng & Units 11/09/2021   10:39 AM 05/02/2021   11:27 AM 01/17/2021    7:35 PM  CMP  Glucose 70 - 99 mg/dL 103  104  106   BUN 6 - 23 mg/dL '18  17  11   '$ Creatinine 0.40 - 1.20 mg/dL 0.94  0.81  0.72   Sodium 135 - 145 mEq/L 140  143  139   Potassium 3.5 - 5.1 mEq/L 4.5  4.2  3.8   Chloride 96 - 112 mEq/L 101  108  102   CO2 19 - 32 mEq/L '30  27  27   '$ Calcium 8.4 - 10.5 mg/dL 9.8  9.2  9.2   Total Protein 6.0 - 8.3 g/dL 7.5  6.6  6.8   Total Bilirubin 0.2 - 1.2 mg/dL 0.8  0.7  0.9   Alkaline Phos 39 - 117 U/L 131  100  89   AST 0 - 37 U/L '23  17  20   '$ ALT 0 - 35 U/L '19  10  14       '$ RADIOGRAPHIC STUDIES: I have personally reviewed the radiological images  as listed and agreed with the findings in the report. No results found.    No orders of the defined types were placed in this encounter.   All questions were answered. The patient knows to call the clinic with any problems, questions or concerns. No barriers to learning was detected. The total time spent in  the appointment was 20 minutes.     Truitt Merle, MD 02/05/2022

## 2022-02-06 ENCOUNTER — Inpatient Hospital Stay (HOSPITAL_BASED_OUTPATIENT_CLINIC_OR_DEPARTMENT_OTHER): Payer: Medicare Other | Admitting: Hematology

## 2022-02-06 ENCOUNTER — Other Ambulatory Visit: Payer: Self-pay

## 2022-02-06 ENCOUNTER — Inpatient Hospital Stay: Payer: Medicare Other | Attending: Hematology

## 2022-02-06 VITALS — BP 187/80 | HR 69 | Temp 97.5°F | Resp 17 | Ht 62.0 in | Wt 137.2 lb

## 2022-02-06 DIAGNOSIS — Z7902 Long term (current) use of antithrombotics/antiplatelets: Secondary | ICD-10-CM | POA: Diagnosis not present

## 2022-02-06 DIAGNOSIS — I10 Essential (primary) hypertension: Secondary | ICD-10-CM | POA: Diagnosis not present

## 2022-02-06 DIAGNOSIS — Z7901 Long term (current) use of anticoagulants: Secondary | ICD-10-CM | POA: Insufficient documentation

## 2022-02-06 DIAGNOSIS — D500 Iron deficiency anemia secondary to blood loss (chronic): Secondary | ICD-10-CM

## 2022-02-06 DIAGNOSIS — E785 Hyperlipidemia, unspecified: Secondary | ICD-10-CM | POA: Diagnosis not present

## 2022-02-06 DIAGNOSIS — Z8673 Personal history of transient ischemic attack (TIA), and cerebral infarction without residual deficits: Secondary | ICD-10-CM | POA: Diagnosis not present

## 2022-02-06 DIAGNOSIS — Z79899 Other long term (current) drug therapy: Secondary | ICD-10-CM | POA: Diagnosis not present

## 2022-02-06 DIAGNOSIS — I4891 Unspecified atrial fibrillation: Secondary | ICD-10-CM | POA: Insufficient documentation

## 2022-02-06 DIAGNOSIS — K219 Gastro-esophageal reflux disease without esophagitis: Secondary | ICD-10-CM | POA: Diagnosis not present

## 2022-02-06 DIAGNOSIS — D509 Iron deficiency anemia, unspecified: Secondary | ICD-10-CM | POA: Diagnosis not present

## 2022-02-06 LAB — CBC WITH DIFFERENTIAL (CANCER CENTER ONLY)
Abs Immature Granulocytes: 0.02 10*3/uL (ref 0.00–0.07)
Basophils Absolute: 0.1 10*3/uL (ref 0.0–0.1)
Basophils Relative: 1 %
Eosinophils Absolute: 0.3 10*3/uL (ref 0.0–0.5)
Eosinophils Relative: 3 %
HCT: 39.6 % (ref 36.0–46.0)
Hemoglobin: 13.5 g/dL (ref 12.0–15.0)
Immature Granulocytes: 0 %
Lymphocytes Relative: 13 %
Lymphs Abs: 0.9 10*3/uL (ref 0.7–4.0)
MCH: 32.1 pg (ref 26.0–34.0)
MCHC: 34.1 g/dL (ref 30.0–36.0)
MCV: 94.3 fL (ref 80.0–100.0)
Monocytes Absolute: 0.8 10*3/uL (ref 0.1–1.0)
Monocytes Relative: 11 %
Neutro Abs: 5.3 10*3/uL (ref 1.7–7.7)
Neutrophils Relative %: 72 %
Platelet Count: 238 10*3/uL (ref 150–400)
RBC: 4.2 MIL/uL (ref 3.87–5.11)
RDW: 12.6 % (ref 11.5–15.5)
WBC Count: 7.4 10*3/uL (ref 4.0–10.5)
nRBC: 0 % (ref 0.0–0.2)

## 2022-02-06 LAB — FERRITIN: Ferritin: 155 ng/mL (ref 11–307)

## 2022-02-06 LAB — IRON AND IRON BINDING CAPACITY (CC-WL,HP ONLY)
Iron: 108 ug/dL (ref 28–170)
Saturation Ratios: 37 % — ABNORMAL HIGH (ref 10.4–31.8)
TIBC: 293 ug/dL (ref 250–450)
UIBC: 185 ug/dL (ref 148–442)

## 2022-02-07 ENCOUNTER — Ambulatory Visit: Payer: Medicare Other | Admitting: Family Medicine

## 2022-02-07 ENCOUNTER — Encounter: Payer: Self-pay | Admitting: Family Medicine

## 2022-02-07 VITALS — BP 142/80 | HR 74 | Temp 98.3°F | Wt 136.1 lb

## 2022-02-07 DIAGNOSIS — M25511 Pain in right shoulder: Secondary | ICD-10-CM | POA: Diagnosis not present

## 2022-02-07 MED ORDER — METHYLPREDNISOLONE 4 MG PO TBPK: ORAL_TABLET | ORAL | 0 refills | Status: DC

## 2022-02-07 NOTE — Progress Notes (Signed)
   Subjective:    Patient ID: Christine Middleton, female    DOB: 10/23/1922, 88 y.o.   MRN: [id number]  HPI Here with her daughter to discuss a pain in the right shoulder that started about 2 weeks ago. No recent injuries or falls. She does admit to moving some heavy tables around her home recently, and she moved a heavy sewing machine from one room to another. The pain negan in the anterior shoulder and now it radiates down the upper arm. No neck pain. She gets some relief with Tylenol.    Review of Systems  Constitutional: Negative.   Respiratory: Negative.    Cardiovascular: Negative.   Musculoskeletal:  Positive for arthralgias.       Objective:   Physical Exam Constitutional:      General: She is not in acute distress.    Appearance: Normal appearance.  Cardiovascular:     Rate and Rhythm: Normal rate. Rhythm irregular.     Pulses: Normal pulses.     Heart sounds: Normal heart sounds.  Pulmonary:     Effort: Pulmonary effort is normal.     Breath sounds: Normal breath sounds.  Musculoskeletal:     Comments: The right shoulder has fairly good ROM, no crepitus. She is tender in the anterior right shoulder, especially in the subacromial area. She is tender along the right biceps muscle to the elbow. No pain with flexing the elbow against resistance. She is able to full extend the right arm without much pain   Neurological:     Mental Status: She is alert.           Assessment & Plan:  She has biceps tendonitis. I advised her to rest the arm and to avoid lifting anything heavier than a dinner plate for the next 4 weeks. She can apply ice several times a day. She is given a Medrol dose pack. Recheck as needed.  [name], MD

## 2022-02-16 ENCOUNTER — Ambulatory Visit (INDEPENDENT_AMBULATORY_CARE_PROVIDER_SITE_OTHER): Payer: Medicare Other

## 2022-02-16 VITALS — BP 137/80 | HR 74 | Ht 62.0 in | Wt 136.0 lb

## 2022-02-16 DIAGNOSIS — Z0000 Encounter for general adult medical examination without abnormal findings: Secondary | ICD-10-CM | POA: Diagnosis not present

## 2022-02-16 NOTE — Progress Notes (Signed)
Subjective:   Christine Middleton is a 88 y.o. female who presents for Medicare Annual (Subsequent) preventive examination.  Review of Systems    Virtual Visit via Telephone Note  I connected with  Christine Middleton on 02/16/22 at 10:30 AM EDT by telephone and verified that I am speaking with the correct person using two identifiers.  Location: Patient: Home Provider: Office Middleton participating in the virtual visit: patient/Nurse Health Advisor   I discussed the limitations, risks, security and privacy concerns of performing an evaluation and management service by telephone and the availability of in person appointments. The patient expressed understanding and agreed to proceed.  Interactive audio and video telecommunications were attempted between this nurse and patient, however failed, due to patient having technical difficulties OR patient did not have access to video capability.  We continued and completed visit with audio only.  Some vital signs may be absent or patient reported.   Criselda Peaches, LPN  Cardiac Risk Factors include: advanced age (>16mn, >>7women);hypertension     Objective:    Today's Vitals   02/16/22 1028  BP: 137/80  Pulse: 74  Weight: 136 lb (61.7 kg)  Height: '5\' 2"'$  (1.575 m)   Body mass index is 24.87 kg/m.     02/16/2022   10:43 AM 02/14/2021    3:37 PM 01/17/2021    7:07 PM 10/23/2020   12:00 AM 09/04/2020    2:07 PM 07/08/2019    2:57 PM 07/08/2019    9:52 AM  Advanced Directives  Does Patient Have a Medical Advance Directive? Yes Yes Yes Yes No No No  Type of [place]amed[place]of [place] will [place] will [place]    Does patient want to make changes to medical advance directive? No - Patient declined  No - Patient declined No - Patient declined     Copy of [place] Chart? Yes - validated most recent copy  scanned in chart (See row information) No - copy requested       Would patient like information on creating a medical advance directive?     No - Patient declined No - Patient declined     Current Medications (verified) Outpatient Encounter Medications as of 02/16/2022  Medication Sig   atenolol (TENORMIN) 50 MG tablet Take 1 tablet (50 mg total) by mouth daily.   ELIQUIS 2.5 MG TABS tablet TAKE 1 TABLET BY MOUTH TWICE A DAY   losartan (COZAAR) 25 MG tablet Take 1 tablet (25 mg total) by mouth daily. In the Evening   mirtazapine (REMERON) 15 MG tablet Take 1/2 (7.5 mg) tablet by mouth at bedtime   pantoprazole (PROTONIX) 40 MG tablet Take 1 tablet (40 mg total) by mouth daily.   polyethylene glycol (MIRALAX / GLYCOLAX) 17 g packet Take 17 g by mouth daily. (Patient taking differently: Take 17 g by mouth as needed.)   Vitamin D, Ergocalciferol, (DRISDOL) 1.25 MG (50000 UNIT) CAPS capsule Take 1 capsule (50,000 Units total) by mouth every 7 (seven) days.   [DISCONTINUED] methylPREDNISolone (MEDROL DOSEPAK) 4 MG TBPK tablet As directed   No facility-administered encounter medications on file as of 02/16/2022.    Allergies (verified) Amoxicillin, Codeine, Lipitor [atorvastatin], Lisinopril, Nitrofurantoin, and Tramadol   History: Past Medical History:  Diagnosis Date   Diverticulitis    Esophageal stricture    GERD (gastroesophageal reflux disease)    Hyperlipidemia  Hypertension    Shingles    Stroke [facility])    Vitamin B 12 deficiency    Past Surgical History:  Procedure Laterality Date   ABDOMINAL HYSTERECTOMY     APPENDECTOMY     COLONOSCOPY  12-09-08   per Dr. Sharlett Iles (incomplete along with barium enema 12-10-08) with hemorrhoids  and diverticulae   COLONOSCOPY WITH PROPOFOL N/A 07/10/2019   Procedure: COLONOSCOPY WITH PROPOFOL;  Surgeon: Irving Copas., MD;  Location: [facility];  Service: Gastroenterology;  Laterality: N/A;   ESOPHAGOGASTRODUODENOSCOPY  09-2004    per Dr. Sharlett Iles with dilatation    EYE SURGERY     HEMOSTASIS CLIP PLACEMENT  07/10/2019   Procedure: HEMOSTASIS CLIP PLACEMENT;  Surgeon: Irving Copas., MD;  Location: [facility];  Service: Gastroenterology;;   POLYPECTOMY  07/10/2019   Procedure: POLYPECTOMY;  Surgeon: Irving Copas., MD;  Location: [facility];  Service: Gastroenterology;;   TONSILLECTOMY     Family History  Problem Relation Age of Onset   Heart attack Mother    Heart disease Father    Breast cancer Other    Diabetes Other    Hypertension Other    Stroke Other    Heart disease Other    Stroke Sister    Cancer Brother    Cancer Brother    Diabetes Brother    Social History   Socioeconomic History   Marital status: Widowed    Spouse name: Grayling Congress. [place]   Number of children: 4   Years of education: HS   Highest education level: 12th grade  Occupational History   Occupation: Retired    Fish farm manager: RETIRED  Tobacco Use   Smoking status: Never   Smokeless tobacco: Never  Vaping Use   Vaping Use: Never used  Substance and Sexual Activity   Alcohol use: No    Alcohol/week: 0.0 standard drinks of alcohol   Drug use: No   Sexual activity: Not on file  Other Topics Concern   Not on file  Social History Narrative   Patient lives at home with family.   Patient is right handed.   Patient has a high school education.   Caffeine Use: 1 cup daily   Social Determinants of Health   Financial Resource Strain: Low Risk  (02/16/2022)   Overall Financial Resource Strain (CARDIA)    Difficulty of Paying Living Expenses: Not hard at all  Food Insecurity: No Food Insecurity (02/16/2022)   Hunger Vital Sign    Worried About Running Out of Food in the Last Year: Never true    Ran Out of Food in the Last Year: Never true  Transportation Needs: No Transportation Needs (02/16/2022)   PRAPARE - Hydrologist (Medical): No    Lack of Transportation (Non-Medical): No   Physical Activity: Inactive (02/16/2022)   Exercise Vital Sign    Days of Exercise per Week: 0 days    Minutes of Exercise per Session: 0 min  Stress: No Stress Concern Present (02/16/2022)   [place]    Feeling of Stress : Not at all  Social Connections: Moderately Integrated (02/16/2022)   Social Connection and Isolation Panel [NHANES]    Frequency of Communication with Friends and Family: More than three times a week    Frequency of Social Gatherings with Friends and Family: More than three times a week    Attends Religious Services: More than 4 times per year  Active Member of Clubs or Organizations: Yes    Attends Archivist Meetings: More than 4 times per year    Marital Status: Widowed  Recent Concern: Social Connections - Moderately Isolated (02/06/2022)   Social Connection and Isolation Panel [NHANES]    Frequency of Communication with Friends and Family: More than three times a week    Frequency of Social Gatherings with Friends and Family: Three times a week    Attends Religious Services: Never    Active Member of Clubs or Organizations: Yes    Attends Archivist Meetings: More than 4 times per year    Marital Status: Widowed    Clinical Intake: How often do you need to have someone help you when you read instructions, pamphlets, or other written materials from your doctor or pharmacy?: 1 - Never  Diabetic?  No   Activities of Daily Living    02/16/2022   10:40 AM  In your present state of health, do you have any difficulty performing the following activities:  Hearing? 1  Comment Wear hearing aids  Vision? 0  Difficulty concentrating or making decisions? 0  Walking or climbing stairs? 0  Dressing or bathing? 0  Doing errands, shopping? [place] and eating ? N  Using the Toilet? N  In the past six months, have you accidently leaked urine? N   Comment Wears depends PRN  Do you have problems with loss of bowel control? N  Managing your Medications? N  Managing your Finances? N  Housekeeping or managing your Housekeeping? N    Patient Care Team: Laurey Morale, MD as PCP - General [place], Peter M, MD as PCP - Cardiology (Cardiology) Viona Gilmore, [facility] as Pharmacist (Pharmacist)  Indicate any recent Medical Services you may have received from other than [facility] providers in the past year (date may be approximate).     Assessment:   This is a routine wellness examination for Christine Middleton.  Hearing/Vision screen Hearing Screening - Comments:: Wears hearing  aids Vision Screening - Comments:: Wears reading glasses. Followed by Dr Katy Fitch  Dietary issues and exercise activities discussed: Exercise limited by: None identified   Goals Addressed               This Visit's Progress     No current goals (pt-stated)         Depression Screen    02/16/2022   10:38 AM 02/07/2022   10:48 AM 02/14/2021    3:39 PM 02/14/2021    3:31 PM 09/14/2020    2:02 PM 10/02/2018    9:30 AM 09/26/2017   10:02 AM  PHQ 2/9 Scores  PHQ - 2 Score 0 1 0 0 '3 1 6  '$ PHQ- 9 Score 0 7         Fall Risk    02/16/2022   10:43 AM 02/07/2022   10:47 AM 02/06/2022   12:56 PM 02/14/2021    3:38 PM 09/14/2020    2:01 PM  Fall Risk   Falls in the past year? 0 0 0 0 1  Number falls in past yr: 0 0  0 0  Injury with Fall? 0 0  0 1  Comment     fractured vertabra  Risk for fall due to : No Fall Risks No Fall Risks  Impaired balance/gait   Risk for fall due to: Comment    use walker   Follow up  Falls evaluation completed  Falls evaluation completed     FALL RISK PREVENTION PERTAINING TO THE HOME:  Any stairs in or around the home? Yes  If so, are there any without handrails? No  Home free of loose throw rugs in walkways, pet beds, electrical cords, etc? Yes  Adequate lighting in your home to reduce risk of falls? Yes   ASSISTIVE DEVICES UTILIZED TO  PREVENT FALLS:  Life alert? No  Use of a cane, walker or w/c? Yes  Grab bars in the bathroom? No  Shower chair or bench in shower? Yes  Elevated toilet seat or a handicapped toilet? No   TIMED UP AND GO:  Was the test performed? No . Audio Visit Cognitive Function:     Immunizations Immunization History  Administered Date(s) Administered   Fluad Quad(high Dose 65+) 07/02/2019, 06/01/2021   Influenza Split 07/03/2012   Influenza Whole 04/30/2006, 05/30/2007, 04/07/2010   Influenza, High Dose Seasonal PF 05/19/2015, 05/02/2017, 04/17/2018   Influenza,inj,Quad PF,6+ Mos 05/09/2013, 07/28/2014   Influenza-Unspecified 05/26/2016, 05/02/2017   PFIZER(Purple Top)SARS-COV-2 Vaccination 12/31/2019, 01/21/2020   Pneumococcal Conjugate-13 08/05/2015   Pneumococcal Polysaccharide-23 07/31/2000, 05/30/2007   Tdap 10/02/2018    TDAP status: Up to date  Flu Vaccine status: Up to date  Pneumococcal vaccine status: Up to date  Covid-19 vaccine status: Completed vaccines  Qualifies for Shingles Vaccine? Yes   Zostavax completed No   Shingrix Completed?: No.    Education has been provided regarding the importance of this vaccine. Patient has been advised to call insurance company to determine out of pocket expense if they have not yet received this vaccine. Advised may also receive vaccine at local pharmacy or Health Dept. Verbalized acceptance and understanding.  Screening Tests Health Maintenance  Topic Date Due   COVID-19 Vaccine (3 - Pfizer series) 03/04/2022 (Originally 03/17/2020)   Zoster Vaccines- Shingrix (1 of 2) 05/19/2022 (Originally 07/31/1974)   DEXA SCAN  02/17/2023 (Originally 07/31/1989)   INFLUENZA VACCINE  02/28/2022   TETANUS/TDAP  10/01/2028   Pneumonia Vaccine 66+ Years old  Completed   HPV VACCINES  Aged Out    Health Maintenance  There are no preventive care reminders to display for this patient.   Colorectal cancer screening: No longer required.    Mammogram status: No longer required due to Age.    Lung Cancer Screening: (Low Dose CT Chest recommended if Age 69-80 years, 30 pack-year currently smoking OR have quit w/in 15years.) does not qualify.    Additional Screening:  Hepatitis C Screening: does not qualify; Completed   Vision Screening: Recommended annual ophthalmology exams for early detection of glaucoma and other disorders of the eye. Is the patient up to date with their annual eye exam?  Yes  Who is the provider or what is the name of the office in which the patient attends annual eye exams? Dr Katy Fitch If pt is not established with a provider, would they like to be referred to a provider to establish care? No .   Dental Screening: Recommended annual dental exams for proper oral hygiene  Community Resource Referral / Chronic Care Management:  CRR required this visit?  No   CCM required this visit?  No      Plan:     I have personally reviewed and noted the following in the patient's chart:   Medical and social history Use of alcohol, tobacco or illicit drugs  Current medications and supplements including opioid prescriptions.  Functional ability and status Nutritional status Physical activity Advanced directives List  of other physicians Hospitalizations, surgeries, and ER visits in previous 12 months Vitals Screenings to include cognitive, depression, and falls Referrals and appointments  In addition, I have reviewed and discussed with patient certain preventive protocols, quality metrics, and best practice recommendations. A written personalized care plan for preventive services as well as general preventive health recommendations were provided to patient.     Criselda Peaches, LPN   04/23/2682   Nurse Notes: None

## 2022-02-16 NOTE — Patient Instructions (Addendum)
Christine Middleton , Thank you for taking time to come for your Medicare Wellness Visit. I appreciate your ongoing commitment to your health goals. Please review the following plan we discussed and let me know if I can assist you in the future.   These are the goals we discussed:  Goals       No current goals (pt-stated)        This is a list of the screening recommended for you and due dates:  Health Maintenance  Topic Date Due   COVID-19 Vaccine (3 - Pfizer series) 03/04/2022*   Zoster (Shingles) Vaccine (1 of 2) 05/19/2022*   DEXA scan (bone density measurement)  02/17/2023*   Flu Shot  02/28/2022   Tetanus Vaccine  10/01/2028   Pneumonia Vaccine  Completed   HPV Vaccine  Aged Out  *Topic was postponed. The date shown is not the original due date.   Advanced directives: Yes  Conditions/risks identified: None  Next appointment: Follow up in one year for your annual wellness visit    Preventive Care 65 Years and Older, Female Preventive care refers to lifestyle choices and visits with your health care provider that can promote health and wellness. What does preventive care include? A yearly physical exam. This is also called an annual well check. Dental exams once or twice a year. Routine eye exams. Ask your health care provider how often you should have your eyes checked. Personal lifestyle choices, including: Daily care of your teeth and gums. Regular physical activity. Eating a healthy diet. Avoiding tobacco and drug use. Limiting alcohol use. Practicing safe sex. Taking low-dose aspirin every day. Taking vitamin and mineral supplements as recommended by your health care provider. What happens during an annual well check? The services and screenings done by your health care provider during your annual well check will depend on your age, overall health, lifestyle risk factors, and family history of disease. Counseling  Your health care provider may ask you questions about  your: Alcohol use. Tobacco use. Drug use. Emotional well-being. Home and relationship well-being. Sexual activity. Eating habits. History of falls. Memory and ability to understand (cognition). Work and work Statistician. Reproductive health. Screening  You may have the following tests or measurements: Height, weight, and BMI. Blood pressure. Lipid and cholesterol levels. These may be checked every 5 years, or more frequently if you are over 48 years old. Skin check. Lung cancer screening. You may have this screening every year starting at age 43 if you have a 30-pack-year history of smoking and currently smoke or have quit within the past 15 years. Fecal occult blood test (FOBT) of the stool. You may have this test every year starting at age 52. Flexible sigmoidoscopy or colonoscopy. You may have a sigmoidoscopy every 5 years or a colonoscopy every 10 years starting at age 25. Hepatitis C blood test. Hepatitis B blood test. Sexually transmitted disease (STD) testing. Diabetes screening. This is done by checking your blood sugar (glucose) after you have not eaten for a while (fasting). You may have this done every 1-3 years. Bone density scan. This is done to screen for osteoporosis. You may have this done starting at age 89. Mammogram. This may be done every 1-2 years. Talk to your health care provider about how often you should have regular mammograms. Talk with your health care provider about your test results, treatment options, and if necessary, the need for more tests. Vaccines  Your health care provider may recommend certain vaccines, such as:  Influenza vaccine. This is recommended every year. Tetanus, diphtheria, and acellular pertussis (Tdap, Td) vaccine. You may need a Td booster every 10 years. Zoster vaccine. You may need this after age 48. Pneumococcal 13-valent conjugate (PCV13) vaccine. One dose is recommended after age 3. Pneumococcal polysaccharide (PPSV23) vaccine.  One dose is recommended after age 63. Talk to your health care provider about which screenings and vaccines you need and how often you need them. This information is not intended to replace advice given to you by your health care provider. Make sure you discuss any questions you have with your health care provider. Document Released: 08/13/2015 Document Revised: 04/05/2016 Document Reviewed: 05/18/2015 Elsevier Interactive Patient Education  2017 [employer] Prevention in the Home Falls can cause injuries. They can happen to people of all ages. There are many things you can do to make your home safe and to help prevent falls. What can I do on the outside of my home? Regularly fix the edges of walkways and driveways and fix any cracks. Remove anything that might make you trip as you walk through a door, such as a raised step or threshold. Trim any bushes or trees on the path to your home. Use bright outdoor lighting. Clear any walking paths of anything that might make someone trip, such as rocks or tools. Regularly check to see if handrails are loose or broken. Make sure that both sides of any steps have handrails. Any raised decks and porches should have guardrails on the edges. Have any leaves, snow, or ice cleared regularly. Use sand or salt on walking paths during winter. Clean up any spills in your garage right away. This includes oil or grease spills. What can I do in the bathroom? Use night lights. Install grab bars by the toilet and in the tub and shower. Do not use towel bars as grab bars. Use non-skid mats or decals in the tub or shower. If you need to sit down in the shower, use a plastic, non-slip stool. Keep the floor dry. Clean up any water that spills on the floor as soon as it happens. Remove soap buildup in the tub or shower regularly. Attach bath mats securely with double-sided non-slip rug tape. Do not have throw rugs and other things on the floor that can make  you trip. What can I do in the bedroom? Use night lights. Make sure that you have a light by your bed that is easy to reach. Do not use any sheets or blankets that are too big for your bed. They should not hang down onto the floor. Have a firm chair that has side arms. You can use this for support while you get dressed. Do not have throw rugs and other things on the floor that can make you trip. What can I do in the kitchen? Clean up any spills right away. Avoid walking on wet floors. Keep items that you use a lot in easy-to-reach places. If you need to reach something above you, use a strong step stool that has a grab bar. Keep electrical cords out of the way. Do not use floor polish or wax that makes floors slippery. If you must use wax, use non-skid floor wax. Do not have throw rugs and other things on the floor that can make you trip. What can I do with my stairs? Do not leave any items on the stairs. Make sure that there are handrails on both sides of the stairs and use them.  Fix handrails that are broken or loose. Make sure that handrails are as long as the stairways. Check any carpeting to make sure that it is firmly attached to the stairs. Fix any carpet that is loose or worn. Avoid having throw rugs at the top or bottom of the stairs. If you do have throw rugs, attach them to the floor with carpet tape. Make sure that you have a light switch at the top of the stairs and the bottom of the stairs. If you do not have them, ask someone to add them for you. What else can I do to help prevent falls? Wear shoes that: Do not have high heels. Have rubber bottoms. Are comfortable and fit you well. Are closed at the toe. Do not wear sandals. If you use a stepladder: Make sure that it is fully opened. Do not climb a closed stepladder. Make sure that both sides of the stepladder are locked into place. Ask someone to hold it for you, if possible. Clearly mark and make sure that you can  see: Any grab bars or handrails. First and last steps. Where the edge of each step is. Use tools that help you move around (mobility aids) if they are needed. These include: Canes. Walkers. Scooters. Crutches. Turn on the lights when you go into a dark area. Replace any light bulbs as soon as they burn out. Set up your furniture so you have a clear path. Avoid moving your furniture around. If any of your floors are uneven, fix them. If there are any pets around you, be aware of where they are. Review your medicines with your doctor. Some medicines can make you feel dizzy. This can increase your chance of falling. Ask your doctor what other things that you can do to help prevent falls. This information is not intended to replace advice given to you by your health care provider. Make sure you discuss any questions you have with your health care provider. Document Released: 05/13/2009 Document Revised: 12/23/2015 Document Reviewed: 08/21/2014 Elsevier Interactive Patient Education  2017 [employer].

## 2022-02-23 ENCOUNTER — Telehealth: Payer: Self-pay | Admitting: Pharmacist

## 2022-02-23 NOTE — Chronic Care Management (AMB) (Signed)
Chronic Care Management Pharmacy Assistant   Name: Christine Middleton  MRN: [id number] DOB: 02-22-1923  Reason for Encounter: Disease State / Hypertension Assessment Call   Conditions to be addressed/monitored: HTN  Recent office visits:  02/16/2022 Rolene Arbour LPN - Medicare annual wellness exam  02/07/2022 [name] MD - Patient was seen for acute pain of right shoulder. Started Medrol dose pack. No follow up noted.   Recent consult visits:  02/06/2022 Truitt Merle MD (oncology) - Patient was seen for iron deficiency anemia due to chronic blood loss.  No medication changes.  Hospital visits:  None  Medications: Outpatient Encounter Medications as of 02/23/2022  Medication Sig   atenolol (TENORMIN) 50 MG tablet Take 1 tablet (50 mg total) by mouth daily.   ELIQUIS 2.5 MG TABS tablet TAKE 1 TABLET BY MOUTH TWICE A DAY   losartan (COZAAR) 25 MG tablet Take 1 tablet (25 mg total) by mouth daily. In the Evening   mirtazapine (REMERON) 15 MG tablet Take 1/2 (7.5 mg) tablet by mouth at bedtime   pantoprazole (PROTONIX) 40 MG tablet Take 1 tablet (40 mg total) by mouth daily.   polyethylene glycol (MIRALAX / GLYCOLAX) 17 g packet Take 17 g by mouth daily. (Patient taking differently: Take 17 g by mouth as needed.)   Vitamin D, Ergocalciferol, (DRISDOL) 1.25 MG (50000 UNIT) CAPS capsule Take 1 capsule (50,000 Units total) by mouth every 7 (seven) days.   No facility-administered encounter medications on file as of 02/23/2022.  Fill History: ATENOLOL 50 MG TABLET 07/05/2021 90   VITAMIN D2 1.'25MG'$ (50,000 UNIT) 01/17/2022 83   LOSARTAN POTASSIUM 25 MG TABS 11/24/2021 90   MIRTAZAPINE 15 MG TABLET 02/19/2022 90   PANTOPRAZOLE SOD DR 40 MG TAB 01/30/2022 90   Reviewed chart prior to disease state call. Spoke with patient regarding BP  Recent Office Vitals: BP Readings from Last 3 Encounters:  02/16/22 137/80  02/07/22 (!) 142/80  02/06/22 (!) 187/80   Pulse Readings from Last 3  Encounters:  02/16/22 74  02/07/22 74  02/06/22 69    Wt Readings from Last 3 Encounters:  02/16/22 136 lb (61.7 kg)  02/07/22 136 lb 2 oz (61.7 kg)  02/06/22 137 lb 3.2 oz (62.2 kg)     Kidney Function Lab Results  Component Value Date/Time   CREATININE 0.94 11/09/2021 10:39 AM   CREATININE 0.81 05/02/2021 11:27 AM   CREATININE 0.72 01/17/2021 07:35 PM   CREATININE 0.83 11/26/2020 12:36 PM   GFR 50.94 (L) 11/09/2021 10:39 AM   GFRNONAA >60 05/02/2021 11:27 AM   GFRAA >60 07/11/2019 06:36 AM       Latest Ref Rng & Units 11/09/2021   10:39 AM 05/02/2021   11:27 AM 01/17/2021    7:35 PM  BMP  Glucose 70 - 99 mg/dL 103  104  106   BUN 6 - 23 mg/dL '18  17  11   '$ Creatinine 0.40 - 1.20 mg/dL 0.94  0.81  0.72   Sodium 135 - 145 mEq/L 140  143  139   Potassium 3.5 - 5.1 mEq/L 4.5  4.2  3.8   Chloride 96 - 112 mEq/L 101  108  102   CO2 19 - 32 mEq/L '30  27  27   '$ Calcium 8.4 - 10.5 mg/dL 9.8  9.2  9.2     Current antihypertensive regimen:  Atenolol 50 mg daily Losartan 25 mg daily  How often are you checking your Blood Pressure? Patient has  not been checking her blood pressures very often, maybe 1-2 times per month. She states she will try and check her blood pressure more often.  Current home BP readings: Patients last two readings were 01/27/22 - 116/60 and 02/06/22 - 150/71  What recent interventions/DTPs have been made by any provider to improve Blood Pressure control since last CPP Visit: No recent interventions  Any recent hospitalizations or ED visits since last visit with CPP? No recent hospital visits.   What diet changes have been made to improve Blood Pressure Control?  Patient follows low sodium Breakfast - patient will have cheerios and grapefruit Lunch - patient will have her main meal for lunch which includes a meat and vegetable Dinner - patient will have something light if she is [place]  What exercise is being done to improve your Blood Pressure Control?   Patient will walk outside in nice weather, she does walk around the house and does all her own housework   Adherence Review: Is the patient currently on ACE/ARB medication? Yes Does the patient have >5 day gap between last estimated fill dates? No  Care Gaps: AWV - scheduled 02/21/2023 Last BP - 142/80 on 02/09/2022  Star Rating Drugs: Losartan 25 mg - last filled 11/24/2021 90 DS at [place] Pharmacist Assistant [phone]

## 2022-05-01 ENCOUNTER — Telehealth: Payer: Self-pay | Admitting: Pharmacist

## 2022-05-01 NOTE — Progress Notes (Signed)
Chronic Care Management Pharmacy Note  05/02/2022 Name:  Christine Middleton MRN:  [id number] DOB:  [birth year]  Summary: Pt had a fall last night BP has been at goal < 140/90 per home readings except today  Recommendations/Changes made from today's visit: -Recommended evaluation post fall due to use of Eliquis -Recommended continued BP monitoring at home -Recommended repeat vitamin D level  Plan: BP assessment in 4 months Follow up as needed  Subjective: Christine Middleton is an 88 y.o. year old female who is a primary patient of Laurey Morale, MD.  The CCM team was consulted for assistance with disease management and care coordination needs.    Engaged with patient by telephone for follow up visit in response to provider referral for pharmacy case management and/or care coordination services.   Consent to Services:  The patient was given information about Chronic Care Management services, agreed to services, and gave verbal consent prior to initiation of services.  Please see initial visit note for detailed documentation.   Patient Care Team: Laurey Morale, MD as PCP - General [place], Peter M, MD as PCP - Cardiology (Cardiology) Viona Gilmore, [facility] as Pharmacist (Pharmacist)  Recent office visits: 02/16/2022 Rolene Arbour LPN - Medicare annual wellness exam.   02/07/2022 [name] MD - Patient was seen for acute pain of right shoulder. Started Medrol dose pack. No follow up noted.   11/09/2021 [name] MD - Patient was seen for Paroxysmal atrial fibrillation and additional issues. Discontinued Ferrous Sulfate and Keppra. No follow up noted.   Recent consult visits: 02/06/2022 Truitt Merle MD (oncology) - Patient was seen for iron deficiency anemia due to chronic blood loss.  No medication changes.  06/28/2021 Caron Presume PA-C [facility]) - Patient was seen for Paroxysmal atrial fibrillation and additional issues. Started Losartan 25 mg 1 tablet every evening. Decreased  Keppra to 250 mg twice daily. Discontinued Tylenol and Colase. Follow up in 1 year.     Hospital visits: None in previous 6 months  Objective:  Lab Results  Component Value Date   CREATININE 0.94 11/09/2021   BUN 18 11/09/2021   GFR 50.94 (L) 11/09/2021   GFRNONAA >60 05/02/2021   GFRAA >60 07/11/2019   NA 140 11/09/2021   K 4.5 11/09/2021   CALCIUM 9.8 11/09/2021   CO2 30 11/09/2021   GLUCOSE 103 (H) 11/09/2021    Lab Results  Component Value Date/Time   HGBA1C 5.7 11/09/2021 10:39 AM   HGBA1C 6.1 10/22/2020 02:39 PM   GFR 50.94 (L) 11/09/2021 10:39 AM   GFR 52.66 (L) 10/22/2020 02:39 PM    Last diabetic Eye exam: No results found for: "HMDIABEYEEXA"  Last diabetic Foot exam: No results found for: "HMDIABFOOTEX"   Lab Results  Component Value Date   CHOL 254 (H) 11/09/2021   HDL 86.30 11/09/2021   LDLCALC 145 (H) 11/09/2021   LDLDIRECT 113.3 07/18/2012   TRIG 117.0 11/09/2021   CHOLHDL 3 11/09/2021       Latest Ref Rng & Units 11/09/2021   10:39 AM 05/02/2021   11:27 AM 01/17/2021    7:35 PM  Hepatic Function  Total Protein 6.0 - 8.3 g/dL 7.5  6.6  6.8   Albumin 3.5 - 5.2 g/dL 4.2  3.6  3.7   AST 0 - 37 U/L $Remo'23  17  20   'YHaCQ$ ALT 0 - 35 U/L $Remo'19  10  14   'CCtVg$ Alk Phosphatase 39 - 117 U/L 131  100  89   Total Bilirubin 0.2 - 1.2 mg/dL 0.8  0.7  0.9   Bilirubin, Direct 0.0 - 0.3 mg/dL 0.1       Lab Results  Component Value Date/Time   TSH 1.39 11/09/2021 10:39 AM   TSH 0.66 10/22/2020 02:39 PM   FREET4 1.26 10/22/2020 02:39 PM       Latest Ref Rng & Units 02/06/2022   10:36 AM 11/09/2021   10:39 AM 08/02/2021   10:28 AM  CBC  WBC 4.0 - 10.5 K/uL 7.4  8.3  6.5   Hemoglobin 12.0 - 15.0 g/dL 13.5  13.4  13.4   Hematocrit 36.0 - 46.0 % 39.6  39.5  39.7   Platelets 150 - 400 K/uL 238  243.0  214     Lab Results  Component Value Date/Time   VD25OH 8.70 (L) 11/08/2020 10:46 AM    Clinical ASCVD: Yes  The ASCVD Risk score (Arnett DK, et al., 2019) failed to  calculate for the following reasons:   The 2019 ASCVD risk score is only valid for ages 57 to 41   The patient has a prior MI or stroke diagnosis       02/16/2022   10:38 AM 02/07/2022   10:48 AM 02/14/2021    3:39 PM  Depression screen PHQ 2/9  Decreased Interest 0 0 0  Down, Depressed, Hopeless 0 1 0  PHQ - 2 Score 0 1 0  Altered sleeping 0 0   Tired, decreased energy 0 3   Change in appetite 0 0   Feeling bad or failure about yourself  0 0   Trouble concentrating 0 3   Moving slowly or fidgety/restless 0 0   Suicidal thoughts 0 0   PHQ-9 Score 0 7   Difficult doing work/chores Not difficult at all Somewhat difficult     CHA2DS2/VAS Stroke Risk Points  Current as of 9 minutes ago     6 >= 2 Points: High Risk  1 - 1.99 Points: Medium Risk  0 Points: Low Risk    Last Change: N/A      Details    This score determines the patient's risk of having a stroke if the  patient has atrial fibrillation.       Points Metrics  0 Has Congestive Heart Failure:  No    Current as of 9 minutes ago  0 Has Vascular Disease:  No    Current as of 9 minutes ago  1 Has Hypertension:  Yes    Current as of 9 minutes ago  2 Age:  62    Current as of 9 minutes ago  0 Has Diabetes:  No    Current as of 9 minutes ago  2 Had Stroke:  Yes  Had TIA:  No  Had Thromboembolism:  No    Current as of 9 minutes ago  1 Female:  Yes    Current as of 9 minutes ago      Social History   Tobacco Use  Smoking Status Never  Smokeless Tobacco Never   BP Readings from Last 3 Encounters:  02/16/22 137/80  02/07/22 (!) 142/80  02/06/22 (!) 187/80   Pulse Readings from Last 3 Encounters:  02/16/22 74  02/07/22 74  02/06/22 69   Wt Readings from Last 3 Encounters:  02/16/22 136 lb (61.7 kg)  02/07/22 136 lb 2 oz (61.7 kg)  02/06/22 137 lb 3.2 oz (62.2 kg)   BMI Readings from Last 3 Encounters:  02/16/22 24.87 kg/m  02/07/22 24.90 kg/m  02/06/22 25.09 kg/m    Assessment/Interventions:  Review of patient past medical history, allergies, medications, health status, including review of consultants reports, laboratory and other test data, was performed as part of comprehensive evaluation and provision of chronic care management services.   SDOH:  (Social Determinants of Health) assessments and interventions performed: Yes  (last 06/01/21) SDOH Interventions    Flowsheet Row Clinical Support from 02/16/2022 in [place] at [place] Management from 06/01/2021 in [place] at [place] from 02/14/2021 in [place] at [place] Management from 03/24/2020 in [place] at [place] Interventions Intervention Not Indicated -- Intervention Not Indicated --  Housing Interventions Intervention Not Indicated -- Intervention Not Indicated --  Transportation Interventions Intervention Not Indicated -- Intervention Not Indicated Intervention Not Indicated  Financial Strain Interventions Intervention Not Indicated Intervention Not Indicated Intervention Not Indicated Intervention Not Indicated  Physical Activity Interventions Intervention Not Indicated -- Intervention Not Indicated --  Stress Interventions Intervention Not Indicated -- Intervention Not Indicated --  Social Connections Interventions Intervention Not Indicated -- Intervention Not Indicated --      SDOH Screenings   Food Insecurity: No Food Insecurity (02/16/2022)  Housing: [place]  (02/16/2022)  Transportation Needs: No Transportation Needs (02/16/2022)  Alcohol Screen: Low Risk  (02/14/2021)  Depression (PHQ2-9): Low Risk  (02/16/2022)  Recent Concern: Depression (PHQ2-9) - Medium Risk (02/07/2022)  Financial Resource Strain: Low Risk  (02/16/2022)  Physical Activity: Inactive (02/16/2022)  Social Connections: Moderately Integrated (02/16/2022)  Recent Concern: Social Connections - Moderately Isolated (02/06/2022)   Stress: No Stress Concern Present (02/16/2022)  Tobacco Use: Low Risk  (02/16/2022)    CCM Care Plan  Allergies  Allergen Reactions   Amoxicillin     REACTION: rash   Codeine    Lipitor [Atorvastatin]     Muscle pain, stiff joints   Lisinopril    Nitrofurantoin    Tramadol Anxiety    Sedation, worsening depression.     Medications Reviewed Today     Reviewed by Viona Gilmore, [facility] (Pharmacist) on 05/02/22 at 1112  Med List Status: <None>   Medication Order Taking? Sig Documenting Provider Last Dose Status Informant  atenolol (TENORMIN) 50 MG tablet [id number]  Take 1 tablet (50 mg total) by mouth daily. Laurey Morale, MD  Active   docusate sodium (COLACE) 100 MG capsule [id number] Yes Take 100 mg by mouth 2 (two) times daily. [provider] Taking Active   ELIQUIS 2.5 MG TABS tablet [id number]  TAKE 1 TABLET BY MOUTH TWICE A DAY Warren Lacy, PA-C  Active   losartan (COZAAR) 25 MG tablet [id number]  Take 1 tablet (25 mg total) by mouth daily. In the Evening Warren Lacy, PA-C  Expired 02/07/22 2359   mirtazapine (REMERON) 15 MG tablet [id number]  Take 1/2 (7.5 mg) tablet by mouth at bedtime Laurey Morale, MD  Active   pantoprazole (PROTONIX) 40 MG tablet [id number]  Take 1 tablet (40 mg total) by mouth daily. Laurey Morale, MD  Active   polyethylene glycol (MIRALAX / GLYCOLAX) 17 g packet [id number] Yes Take 17 g by mouth daily.  Patient taking differently: Take 17 g by mouth as needed.   Harold Hedge, MD Taking Active   Vitamin D, Ergocalciferol, (DRISDOL) 1.25 MG (50000 UNIT) CAPS capsule [id number]  Take 1 capsule (50,000 Units total) by mouth every 7 (  seven) days. 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   Cerebrovascular accident (CVA) ([place])    Embolic stroke ([place]) 03/00/9233   Facial droop 08/18/2016   HYPERGLYCEMIA 06/30/2010   Cerebral artery occlusion with cerebral infarction ([place]) 07/19/2009   Disorder resulting from impaired renal function 07/19/2009   Polymyalgia rheumatica ([place]) 07/19/2009   B12 deficiency 06/30/2009   Anxiety state 06/28/2009   Hereditary and idiopathic peripheral neuropathy 06/28/2009   External hemorrhoids 12/03/2008   ESOPHAGEAL STRICTURE 11/30/2008   Diverticulosis of colon 11/30/2008   GROSS HEMATURIA 11/04/2008   MYALGIA 11/04/2008   Constipation 06/24/2008   HLD (hyperlipidemia) 05/30/2007   SKIN LESION 05/30/2007   Essential hypertension 03/19/2007   GERD 03/19/2007    Immunization History  Administered Date(s) Administered   Fluad Quad(high Dose 65+) 07/02/2019, 06/01/2021   Influenza Split 07/03/2012   Influenza Whole 04/30/2006, 05/30/2007, 04/07/2010   Influenza, High Dose Seasonal PF 05/19/2015, 05/02/2017, 04/17/2018   Influenza,inj,Quad PF,6+ Mos 05/09/2013, 07/28/2014   Influenza-Unspecified 05/26/2016, 05/02/2017   PFIZER(Purple Top)SARS-COV-2 Vaccination 12/31/2019, 01/21/2020   Pneumococcal Conjugate-13 08/05/2015   Pneumococcal Polysaccharide-23 07/31/2000, 05/30/2007   Tdap 10/02/2018   Spoke with patient's son as he was there with her today. Patient fell during the night and has a black eye now and red swollen nose. She woke up around 2 am and had a pain in her foot that caused her to fall when she put weight on it. He checked her BP and it was elevated today at 150/80.   Patient doesn't drink a lot  of water and her son encourages her to do so frequently. He is trying to avoid dehydration for her.   Conditions to be addressed/monitored:  Hypertension, Hyperlipidemia, Atrial Fibrillation, Osteoporosis, and vitamin D deficiency, anemia, insomnia  Conditions addressed this visit: Hypertension, Afib  Care Plan : CCM Pharmacy Care Plan  Updates made by Viona Gilmore, [place] since 05/02/2022 12:00 AM     Problem: Problem: Hypertension, Hyperlipidemia, Atrial Fibrillation, Osteoporosis, and vitamin D deficiency, anemia, insomnia      Long-Range Goal: Patient-Specific Goal   Start Date: 06/01/2021  Expected End Date: 06/01/2022  Recent Progress: On track  Priority: High  Note:   Current Barriers:  Unable to independently monitor therapeutic efficacy  Pharmacist Clinical Goal(s):  Patient will achieve adherence to monitoring guidelines and medication adherence to achieve therapeutic efficacy through collaboration with PharmD and provider.   Interventions: 1:1 collaboration with Laurey Morale, MD regarding development and update of comprehensive plan of care as evidenced by provider attestation and co-signature Inter-disciplinary care team collaboration (see longitudinal plan of care) Comprehensive medication review performed; medication list updated in electronic medical record  Hypertension (BP goal <140/90) -Not ideally controlled -Current treatment: Atenolol 50 mg 1 tablet daily - Appropriate, Effective, Safe, Accessible Losartan 25 mg 1 tablet daily - Appropriate, Effective, Safe, Accessible -Medications previously tried:  losartan, amlodipine (low BP)  -Current home readings: 123/79, 119/72, 120/73, 160/97, 150/80 -Current dietary habits: did not discuss -Current exercise habits: walks with walker -Denies hypotensive/hypertensive symptoms -Educated on BP goals and benefits of medications for prevention of heart attack, stroke and kidney damage; Importance of home blood  pressure monitoring; Proper BP monitoring technique; -Counseled to monitor BP at home weekly, document, and provide log at future appointments -Counseled on diet and exercise extensively Recommended to continue current medication  Hyperlipidemia: (LDL goal < 70) -Controlled -Current treatment: No medications -Medications previously tried: simvastatin -Current dietary patterns: did not discuss -Current exercise habits: did not discuss -Educated on Cholesterol goals;  -Collaborated with PCP about considering stopping simvastatin.  Atrial Fibrillation (Goal: prevent stroke and major bleeding) -Not ideally controlled -CHADSVASC: 6 -Current treatment: Rate control: atenolol 50 mg daily - Appropriate, Effective, Safe, Accessible Anticoagulation: Eliquis 2.5 mg 1 tablet twice daily - Appropriate, Effective, Safe, Accessible -Medications previously tried: none -Home BP and HR readings: pulse is running in the 70-80s range  -Counseled on increased risk of stroke due to Afib and benefits of anticoagulation for stroke prevention; -Counseled on diet and exercise extensively Recommended to continue current medication   Vitamin D deficiency (Goal: vitamin D 30-100) -Uncontrolled -Current treatment  Vitamin D 50,000 units once weekly - Appropriate, Query effective, Safe, Accessible -Medications previously tried: none  -Recommended taking with food.  GERD/Barrett's esophagus (Goal: minimize symptoms) -Controlled -Current treatment  Pantoprazole 40 mg 1 tablet daily - Appropriate, Effective, Safe, Accessible -Medications previously tried: none  -Recommended to continue current medication Counseled on benefits of PPIs for Barrett's esophagus.  Anemia (Goal: prevent bleeding) -Controlled -Current treatment  No medications -Medications previously tried: none  -Recommended trial of ferrous gluconate to help with GI side effects.  Insomnia (Goal: improve quality and quantity of  sleep) -Controlled -Current treatment  Mirtazapine 15 mg 1/2 tablet daily - Appropriate, Effective, Safe, Accessible -Medications previously tried: none  -Recommended to continue current medication  Health Maintenance -Vaccine gaps: shingrix, COVID booster, influenza -Current therapy:  Miralax as needed Docusate 100 mg twice daily -Educated on Cost vs benefit of each product must be carefully weighed by individual consumer -Patient is satisfied with current therapy and denies issues -Recommended to continue current medication  Patient Goals/Self-Care Activities Patient will:  - take medications as prescribed as evidenced by patient report and record review check blood pressure weekly, document, and provide at future appointments  Follow Up Plan: The care management team will reach out to the patient again over the next 120 days.         Medication Assistance: None required.  Patient affirms current coverage meets needs.  Compliance/Adherence/Medication fill history: Care Gaps: Influenza, COVID booster, shingrix, DEXA Last BP - 142/80 on 02/07/2022  Star-Rating Drugs: Losartan 25 mg - last [facility]  Patient's preferred pharmacy is:  [id number] - [place], [place] - [address] [place] [address] Phone: [phone] Fax: [phone]   Uses pill box? Yes Pt endorses 90% compliance  We discussed: Current pharmacy is preferred with insurance plan and patient is satisfied with pharmacy services Patient decided to: Continue current medication management strategy  Care Plan and Follow Up Patient Decision:  Patient agrees to Care Plan and Follow-up.  Plan: The care management team will reach out to the patient again over the next 120 days.  [name], PharmD, [place] Clinical Pharmacist [place] at [facility]  [phone]   

## 2022-05-01 NOTE — Chronic Care Management (AMB) (Signed)
    Chronic Care Management Pharmacy Assistant   Name: Christine Middleton  MRN: [id number] DOB: 1923/01/09  05/02/2022 APPOINTMENT REMINDER  [name]'s daughter was reminded to have all medications, supplements and any blood glucose and blood pressure readings available for review with [name], Pharm. D, at her telephone visit on 05/02/2022 at 11:00.  Care Gaps: AWV - scheduled 02/21/2023 Last BP - 142/80 on 02/07/2022 Covid - overdue Flu - due Shingrix - postponed Dexa scan - postponed  Star Rating Drug: Losartan 25 mg - last filled [facility]/25/2022 [facility]  Any gaps in medications fill history? No  [name]  Catering manager [phone]

## 2022-05-02 ENCOUNTER — Ambulatory Visit (INDEPENDENT_AMBULATORY_CARE_PROVIDER_SITE_OTHER): Payer: Medicare Other | Admitting: Pharmacist

## 2022-05-02 DIAGNOSIS — I10 Essential (primary) hypertension: Secondary | ICD-10-CM

## 2022-05-02 DIAGNOSIS — I480 Paroxysmal atrial fibrillation: Secondary | ICD-10-CM

## 2022-05-02 NOTE — Patient Instructions (Signed)
Hi [place],  It was great to catch up with you again! I hope you feel better soon. You really may want to consider getting checked out after your fall just to be on the safer side.  Please reach out to me if you have any questions or need anything!  Best, Maddie  [name], PharmD, [place] Pharmacist [place] at [place]

## 2022-05-03 ENCOUNTER — Ambulatory Visit: Payer: Medicare Other | Admitting: Family Medicine

## 2022-05-03 ENCOUNTER — Emergency Department (HOSPITAL_COMMUNITY)
Admission: EM | Admit: 2022-05-03 | Discharge: 2022-05-03 | Disposition: A | Discharge status: Home / Self Care | Payer: Medicare Other | Attending: Emergency Medicine | Admitting: Emergency Medicine

## 2022-05-03 ENCOUNTER — Emergency Department (HOSPITAL_COMMUNITY): Payer: Medicare Other

## 2022-05-03 ENCOUNTER — Encounter (HOSPITAL_COMMUNITY): Payer: Self-pay | Admitting: Emergency Medicine

## 2022-05-03 ENCOUNTER — Other Ambulatory Visit: Payer: Self-pay

## 2022-05-03 DIAGNOSIS — S022XXA Fracture of nasal bones, initial encounter for closed fracture: Secondary | ICD-10-CM | POA: Insufficient documentation

## 2022-05-03 DIAGNOSIS — K828 Other specified diseases of gallbladder: Secondary | ICD-10-CM | POA: Diagnosis not present

## 2022-05-03 DIAGNOSIS — Z7901 Long term (current) use of anticoagulants: Secondary | ICD-10-CM | POA: Diagnosis not present

## 2022-05-03 DIAGNOSIS — M25571 Pain in right ankle and joints of right foot: Secondary | ICD-10-CM | POA: Diagnosis not present

## 2022-05-03 DIAGNOSIS — S20211A Contusion of right front wall of thorax, initial encounter: Secondary | ICD-10-CM | POA: Insufficient documentation

## 2022-05-03 DIAGNOSIS — M4802 Spinal stenosis, cervical region: Secondary | ICD-10-CM | POA: Diagnosis not present

## 2022-05-03 DIAGNOSIS — K760 Fatty (change of) liver, not elsewhere classified: Secondary | ICD-10-CM | POA: Insufficient documentation

## 2022-05-03 DIAGNOSIS — Z043 Encounter for examination and observation following other accident: Secondary | ICD-10-CM | POA: Diagnosis not present

## 2022-05-03 DIAGNOSIS — R079 Chest pain, unspecified: Secondary | ICD-10-CM | POA: Diagnosis not present

## 2022-05-03 DIAGNOSIS — W19XXXA Unspecified fall, initial encounter: Secondary | ICD-10-CM

## 2022-05-03 DIAGNOSIS — Y92002 Bathroom of unspecified non-institutional (private) residence single-family (private) house as the place of occurrence of the external cause: Secondary | ICD-10-CM | POA: Insufficient documentation

## 2022-05-03 DIAGNOSIS — S0992XA Unspecified injury of nose, initial encounter: Secondary | ICD-10-CM | POA: Diagnosis present

## 2022-05-03 DIAGNOSIS — S2231XA Fracture of one rib, right side, initial encounter for closed fracture: Secondary | ICD-10-CM | POA: Diagnosis not present

## 2022-05-03 DIAGNOSIS — W010XXA Fall on same level from slipping, tripping and stumbling without subsequent striking against object, initial encounter: Secondary | ICD-10-CM | POA: Insufficient documentation

## 2022-05-03 DIAGNOSIS — I10 Essential (primary) hypertension: Secondary | ICD-10-CM | POA: Diagnosis not present

## 2022-05-03 LAB — COMPREHENSIVE METABOLIC PANEL
ALT: 26 U/L (ref 0–44)
AST: 34 U/L (ref 15–41)
Albumin: 3.8 g/dL (ref 3.5–5.0)
Alkaline Phosphatase: 109 U/L (ref 38–126)
Anion gap: 11 (ref 5–15)
BUN: 9 mg/dL (ref 8–23)
CO2: 27 mmol/L (ref 22–32)
Calcium: 9.4 mg/dL (ref 8.9–10.3)
Chloride: 101 mmol/L (ref 98–111)
Creatinine, Ser: 1.08 mg/dL — ABNORMAL HIGH (ref 0.44–1.00)
GFR, Estimated: 47 mL/min — ABNORMAL LOW (ref >60)
Glucose, Bld: 110 mg/dL — ABNORMAL HIGH (ref 70–99)
Potassium: 3.8 mmol/L (ref 3.5–5.1)
Sodium: 139 mmol/L (ref 135–145)
Total Bilirubin: 1.6 mg/dL — ABNORMAL HIGH (ref 0.3–1.2)
Total Protein: 7.6 g/dL (ref 6.5–8.1)

## 2022-05-03 LAB — CBC
HCT: 41.8 % (ref 36.0–46.0)
Hemoglobin: 13.8 g/dL (ref 12.0–15.0)
MCH: 31.6 pg (ref 26.0–34.0)
MCHC: 33 g/dL (ref 30.0–36.0)
MCV: 95.7 fL (ref 80.0–100.0)
Platelets: 248 10*3/uL (ref 150–400)
RBC: 4.37 MIL/uL (ref 3.87–5.11)
RDW: 12.4 % (ref 11.5–15.5)
WBC: 9.7 10*3/uL (ref 4.0–10.5)
nRBC: 0 % (ref 0.0–0.2)

## 2022-05-03 LAB — LIPASE, BLOOD: Lipase: 36 U/L (ref 11–51)

## 2022-05-03 MED ORDER — IOHEXOL 350 MG/ML SOLN
70.0000 mL | Freq: Once | INTRAVENOUS | Status: AC | PRN
  Administered 2022-05-03: 70 mL via INTRAVENOUS

## 2022-05-03 NOTE — Discharge Instructions (Signed)
Return for any problem.  ?

## 2022-05-03 NOTE — ED Provider Triage Note (Signed)
Emergency Medicine Provider Triage Evaluation Note  Christine Middleton , Middleton 88 y.o. female  was evaluated in triage.  Pt complains of fall.  On anticoagulation for Eliquis.  States she is getting up to go to the bathroom around 2 AM on Tuesday when she fell.  Needed help to get back into bed.  Noted today she had worsening bruising.  Diffuse swelling and ecchymosis to face.  She has bruising to her right flank with tenderness to her inferior ribs and right abdomen. Review of Systems  Positive: Fall, bruising  Negative:   Physical Exam  There were no vitals taken for this visit. Gen:   Awake, no distress   Head:  Eccymosis face, abrasions distal aspect nose Resp:  Normal effort  MSK:   Moves extremities without difficulty, soft tissue swelling about right ankle.  Mild tenderness to paraspinal cervical region. ABD:  Tenderness and bruising to right flank and abdomen     Medical Decision Making  Medically screening exam initiated at 2:01 PM.  Appropriate orders placed.  Christine Middleton was informed that the remainder of the evaluation will be completed by another provider, this initial triage assessment does not replace that evaluation, and the importance of remaining in the ED until their evaluation is complete.  Fall on thinners  Nursing aware about bed placement   Christine Tep A, PA-C 05/03/22 1403

## 2022-05-03 NOTE — ED Triage Notes (Signed)
Patient here for evaluation after tripping and falling yesterday, is on eliquis. Denies LOC. Patient is alert, oriented, and in no apparent distress. Significant bruising on face, torso, and extremities.

## 2022-05-03 NOTE — ED Notes (Signed)
Patient ambulated to the bathroom with stand by assist from this RN.

## 2022-05-03 NOTE — ED Provider Notes (Signed)
[facility] EMERGENCY DEPARTMENT Provider Note   CSN: [id number] Arrival date & time: 05/03/22  1220     History  Chief Complaint  Patient presents with   Christine Middleton is a 88 y.o. female.  88 year old female with prior medical history as detailed below presents for evaluation.  Patient reports fall yesterday morning around 2 AM.  She had gotten up to use the bathroom and then lost her balance and fell.  She has bruising to her face and nose.  She has bruising to the right ribs.  She is still able to ambulate without difficulty.  She denies loss of consciousness.  She is on Eliquis.  She is accompanied by her daughter who is at bedside.  The history is provided by the patient and medical records.       Home Medications Prior to Admission medications   Medication Sig Start Date End Date Taking? Authorizing Provider  atenolol (TENORMIN) 50 MG tablet Take 1 tablet (50 mg total) by mouth daily. 06/06/21   Laurey Morale, MD  docusate sodium (COLACE) 100 MG capsule Take 100 mg by mouth 2 (two) times daily.    [provider]  ELIQUIS 2.5 MG TABS tablet TAKE 1 TABLET BY MOUTH TWICE A DAY 01/17/22   Warren Lacy, PA-C  losartan (COZAAR) 25 MG tablet Take 1 tablet (25 mg total) by mouth daily. In the Evening 06/28/21 02/07/22  Warren Lacy, PA-C  mirtazapine (REMERON) 15 MG tablet Take 1/2 (7.5 mg) tablet by mouth at bedtime 11/09/21   Laurey Morale, MD  pantoprazole (PROTONIX) 40 MG tablet Take 1 tablet (40 mg total) by mouth daily. 11/09/21   Laurey Morale, MD  polyethylene glycol (MIRALAX / GLYCOLAX) 17 g packet Take 17 g by mouth daily. Patient taking differently: Take 17 g by mouth as needed. 07/12/19   Harold Hedge, MD  Vitamin D, Ergocalciferol, (DRISDOL) 1.25 MG (50000 UNIT) CAPS capsule Take 1 capsule (50,000 Units total) by mouth every 7 (seven) days. 11/09/21   Laurey Morale, MD      Allergies    Amoxicillin, Codeine,  Lipitor [atorvastatin], Lisinopril, Nitrofurantoin, and Tramadol    Review of Systems   Review of Systems  All other systems reviewed and are negative.   Physical Exam Updated Vital Signs BP (!) 173/84   Pulse 77   Temp 98.1 F (36.7 C) (Oral)   Resp 15   SpO2 95%  Physical Exam Vitals and nursing note reviewed.  Constitutional:      General: She is not in acute distress.    Appearance: She is well-developed.  HENT:     Head: Normocephalic.     Comments: Ecchymosis to the nose and cheeks bilaterally.    Right Ear: External ear normal.     Left Ear: External ear normal.     Nose:     Comments: Swelling and ecchymosis of the nasal bridge noted.  Concerning for likely nasal bone fracture.    Mouth/Throat:     Mouth: Mucous membranes are moist.  Eyes:     Extraocular Movements: Extraocular movements intact.     Conjunctiva/sclera: Conjunctivae normal.     Pupils: Pupils are equal, round, and reactive to light.  Cardiovascular:     Rate and Rhythm: Normal rate and regular rhythm.     Heart sounds: Normal heart sounds.  Pulmonary:     Effort: Pulmonary effort is normal. No respiratory distress.  Breath sounds: Normal breath sounds.  Abdominal:     General: There is no distension.     Palpations: Abdomen is soft.     Tenderness: There is no abdominal tenderness.  Musculoskeletal:        General: No deformity. Normal range of motion.     Cervical back: Normal range of motion and neck supple.     Comments: Ecchymosis to the right lateral ribs.  No bony abnormality noted.  No crepitus.  No bony step-off.  Skin:    General: Skin is warm and dry.  Neurological:     General: No focal deficit present.     Mental Status: She is alert and oriented to person, place, and time. Mental status is at baseline.     Comments: Ambulating well without difficulty.     ED Results / Procedures / Treatments   Labs (all labs ordered are listed, but only abnormal results are  displayed) Labs Reviewed  COMPREHENSIVE METABOLIC PANEL - Abnormal; Notable for the following components:      Result Value   Glucose, Bld 110 (*)    Creatinine, Ser 1.08 (*)    Total Bilirubin 1.6 (*)    GFR, Estimated 47 (*)    All other components within normal limits  CBC  LIPASE, BLOOD  URINALYSIS, ROUTINE W REFLEX MICROSCOPIC  CBG MONITORING, ED    EKG EKG Interpretation  Date/Time:  Wednesday May 03 2022 14:05:15 EDT Ventricular Rate:  70 PR Interval:  190 QRS Duration: 92 QT Interval:  394 QTC Calculation: 425 R Axis:   -44 Text Interpretation: Normal sinus rhythm Left axis deviation Moderate voltage criteria for LVH, may be normal variant ( R in aVL , Cornell product ) Septal infarct , age undetermined Abnormal ECG When compared with ECG of 17-Jan-2021 18:47, PREVIOUS ECG IS PRESENT Confirmed by Dene Gentry [phone]) on 05/03/2022 4:24:21 PM  Radiology CT CHEST ABDOMEN PELVIS W CONTRAST  Result Date: 05/03/2022 CLINICAL DATA:  Trauma, fall EXAM: CT CHEST, ABDOMEN, AND PELVIS WITH CONTRAST TECHNIQUE: Multidetector CT imaging of the chest, abdomen and pelvis was performed following the standard protocol during bolus administration of intravenous contrast. RADIATION DOSE REDUCTION: This exam was performed according to the departmental dose-optimization program which includes automated exposure control, adjustment of the mA and/or kV according to patient size and/or use of iterative reconstruction technique. CONTRAST:  37m OMNIPAQUE IOHEXOL 350 MG/ML SOLN COMPARISON:  Patient FINDINGS: CT CHEST FINDINGS Cardiovascular: Major vascular structures in mediastinum appear intact. Coronary artery calcifications are seen. There is ectasia of ascending thoracic aorta measuring 3.8 cm. Mediastinum/Nodes: There is no mediastinal hematoma. Lungs/Pleura: There is no focal pulmonary consolidation. There is minimal crowding of markings in the lower lung fields. There is no pleural effusion  or pneumothorax. Musculoskeletal: There is mild 20% decrease in height of body of T4 vertebra, possibly old compression. There is no demonstrable radiolucent fracture line. CT ABDOMEN PELVIS FINDINGS Hepatobiliary: There is 1.6 cm area of coarse calcifications in the posterior aspect of right lobe of liver which has not changed. There is fatty infiltration in liver. There is no dilation of bile ducts. There is mild wall thickening in gallbladder. There is no fluid around the gallbladder. Pancreas: No focal abnormalities are seen. Spleen: Unremarkable. Adrenals/Urinary Tract: Adrenals are not enlarged. There is no hydronephrosis. There is no perinephric fluid collection. There are foci of cortical thinning in the left kidney suggesting scarring from chronic pyelonephritis. Urinary bladder is unremarkable. Stomach/Bowel: Small hiatal hernia is seen.  Small bowel loops are not dilated. Appendix is not seen. There is no significant wall thickening in colon. There is no pericolic stranding scattered diverticula are seen in colon without signs of focal diverticulitis. Vascular/Lymphatic: Abdominal aorta and its major branches appear patent. There is no evidence of retroperitoneal hematoma. Reproductive: Uterus is not seen. Other: There is no ascites or pneumoperitoneum. Musculoskeletal: There is decrease in height of bodies of L1, L2 and L3 vertebrae. There is worsening of compression of body of L1 vertebra since 09/04/2020. No significant changes are noted in the compression fractures in the L2 and L3 vertebrae. IMPRESSION: No significant acute findings are seen in CT scan of chest, abdomen and pelvis. There is no evidence of mediastinal or retroperitoneal hematoma. There is no focal pulmonary contusion. There is no pleural effusion or pneumothorax. There is no demonstrable laceration in the solid organs. There is no ascites or pneumoperitoneum. Compression fractures are seen in the bodies of T4, L1, L2 and L3 vertebrae,  most likely old compressions. There is interval worsening of compression fracture of L1 vertebral body in comparison with the CT done on 09/04/2020. Other findings as described in the body of the report. Electronically Signed   By: Elmer Picker M.D.   On: 05/03/2022 20:50   CT HEAD WO CONTRAST  Result Date: 05/03/2022 CLINICAL DATA:  Facial trauma, blunt; Head trauma, minor (Age >= 65y). Fall on anticoagulant. EXAM: CT HEAD WITHOUT CONTRAST CT MAXILLOFACIAL WITHOUT CONTRAST TECHNIQUE: Multidetector CT imaging of the head and maxillofacial structures were performed using the standard protocol without intravenous contrast. Multiplanar CT image reconstructions of the maxillofacial structures were also generated. RADIATION DOSE REDUCTION: This exam was performed according to the departmental dose-optimization program which includes automated exposure control, adjustment of the mA and/or kV according to patient size and/or use of iterative reconstruction technique. COMPARISON:  Head CT 01/17/2021 and MRI 01/18/2021 FINDINGS: CT HEAD FINDINGS Brain: There is no evidence of an acute infarct, intracranial hemorrhage, midline shift, or extra-axial fluid collection. Chronic right occipital and left cerebellar infarcts are unchanged, as is a tiny chronic infarct in the high posterior left frontal lobe. A 1.8 cm partially calcified extra-axial mass over the posterolateral left frontal convexity is unchanged. Mild cerebral atrophy is within normal limits for age. Cerebral white matter hypodensities are similar to the prior CT and are nonspecific but compatible with mild chronic small vessel ischemic disease. Vascular: Calcified atherosclerosis at the skull base. No hyperdense vessel. Skull: No acute fracture. Mixed lucency and sclerosis of the skull overlying the meningioma, similar to the prior CT. Other: None. CT MAXILLOFACIAL FINDINGS Osseous: Minimally displaced bilateral nasal bone fractures. No mandibular  dislocation. Bilateral TMJ osteoarthrosis. Orbits: Bilateral cataract extraction.  No acute traumatic finding. Sinuses: Mild mucosal thickening in the right frontal and bilateral ethmoid sinuses and moderate mucosal thickening in the maxillary sinuses. No layering sinus fluid. Clear mastoid air cells. Soft tissues: Mild nasal soft tissue swelling extending into the premalar regions. Mild calcific atherosclerosis at the carotid bifurcations. IMPRESSION: 1. No evidence of acute intracranial abnormality. 2. Minimally displaced nasal bone fractures. 3. Chronic ischemia with multiple old infarcts. 4. Unchanged 1.8 cm left frontal meningioma. Electronically Signed   By: Logan Bores M.D.   On: 05/03/2022 20:42   CT Maxillofacial Wo Contrast  Result Date: 05/03/2022 CLINICAL DATA:  Facial trauma, blunt; Head trauma, minor (Age >= 65y). Fall on anticoagulant. EXAM: CT HEAD WITHOUT CONTRAST CT MAXILLOFACIAL WITHOUT CONTRAST TECHNIQUE: Multidetector CT imaging of the head and  maxillofacial structures were performed using the standard protocol without intravenous contrast. Multiplanar CT image reconstructions of the maxillofacial structures were also generated. RADIATION DOSE REDUCTION: This exam was performed according to the departmental dose-optimization program which includes automated exposure control, adjustment of the mA and/or kV according to patient size and/or use of iterative reconstruction technique. COMPARISON:  Head CT 01/17/2021 and MRI 01/18/2021 FINDINGS: CT HEAD FINDINGS Brain: There is no evidence of an acute infarct, intracranial hemorrhage, midline shift, or extra-axial fluid collection. Chronic right occipital and left cerebellar infarcts are unchanged, as is a tiny chronic infarct in the high posterior left frontal lobe. A 1.8 cm partially calcified extra-axial mass over the posterolateral left frontal convexity is unchanged. Mild cerebral atrophy is within normal limits for age. Cerebral white  matter hypodensities are similar to the prior CT and are nonspecific but compatible with mild chronic small vessel ischemic disease. Vascular: Calcified atherosclerosis at the skull base. No hyperdense vessel. Skull: No acute fracture. Mixed lucency and sclerosis of the skull overlying the meningioma, similar to the prior CT. Other: None. CT MAXILLOFACIAL FINDINGS Osseous: Minimally displaced bilateral nasal bone fractures. No mandibular dislocation. Bilateral TMJ osteoarthrosis. Orbits: Bilateral cataract extraction.  No acute traumatic finding. Sinuses: Mild mucosal thickening in the right frontal and bilateral ethmoid sinuses and moderate mucosal thickening in the maxillary sinuses. No layering sinus fluid. Clear mastoid air cells. Soft tissues: Mild nasal soft tissue swelling extending into the premalar regions. Mild calcific atherosclerosis at the carotid bifurcations. IMPRESSION: 1. No evidence of acute intracranial abnormality. 2. Minimally displaced nasal bone fractures. 3. Chronic ischemia with multiple old infarcts. 4. Unchanged 1.8 cm left frontal meningioma. Electronically Signed   By: Logan Bores M.D.   On: 05/03/2022 20:42   CT Cervical Spine Wo Contrast  Result Date: 05/03/2022 CLINICAL DATA:  Fall, trauma. EXAM: CT CERVICAL SPINE WITHOUT CONTRAST TECHNIQUE: Multidetector CT imaging of the cervical spine was performed without intravenous contrast. Multiplanar CT image reconstructions were also generated. RADIATION DOSE REDUCTION: This exam was performed according to the departmental dose-optimization program which includes automated exposure control, adjustment of the mA and/or kV according to patient size and/or use of iterative reconstruction technique. COMPARISON:  None Available. FINDINGS: Alignment: There is 2 mm of anterolisthesis at C7-T1 which is favored is degenerative. Alignment is otherwise anatomic. Skull base and vertebrae: No acute fracture. No primary bone lesion or focal  pathologic process. Soft tissues and spinal canal: No prevertebral fluid or swelling. No visible canal hematoma. Disc levels: There is mild disc space narrowing and endplate osteophyte formation throughout the cervical spine most significant at C5-C6 and C6-C7 compatible with degenerative change. There is severe left-sided neural foraminal stenosis at C5-C6 secondary to uncovertebral spurring and osteophyte formation. There is moderate right-sided neural foraminal stenosis at C3-C4 and C4-C5 secondary to facet arthropathy. No severe central canal stenosis at any level. Upper chest: Negative. Other: None. IMPRESSION: 1. No acute fracture or traumatic subluxation of the cervical spine. 2. Multilevel degenerative changes of the cervical spine with severe left-sided neural foraminal stenosis at C5-C6 and moderate right-sided neural foraminal stenosis at C3-C4 and C4-C5. Electronically Signed   By: Ronney Asters M.D.   On: 05/03/2022 20:27   DG Ankle Complete Right  Result Date: 05/03/2022 CLINICAL DATA:  Fall 2 days ago. Right ankle joint pain with swelling and bruising around the lateral malleolus and dorsal foot. EXAM: RIGHT ANKLE - COMPLETE 3+ VIEW COMPARISON:  None Available. FINDINGS: There is diffuse decreased bone mineralization.  Minimal lateral malleolar soft tissue swelling. No acute fracture is visualized. The ankle mortise is intact. Minimal lateral tibiotalar joint space narrowing. Minimal dorsal talonavicular navicular-cuneiform degenerative osteophytes. No dislocation. IMPRESSION: Minimal lateral malleolar soft tissue swelling. No acute fracture is seen. Electronically Signed   By: Yvonne Kendall M.D.   On: 05/03/2022 15:38   DG Ribs Unilateral W/Chest Right  Result Date: 05/03/2022 CLINICAL DATA:  Status post fall 2 days ago.  Chest pain. EXAM: RIGHT RIBS AND CHEST - 3+ VIEW COMPARISON:  10/04/2013 FINDINGS: Mild bilateral chronic interstitial thickening. No focal consolidation. No pleural effusion  or pneumothorax. Heart and mediastinal contours are unremarkable. No acute osseous abnormality. IMPRESSION: 1. No acute cardiopulmonary disease. 2. No acute displaced rib fracture. Electronically Signed   By: Kathreen Devoid M.D.   On: 05/03/2022 15:38   DG Pelvis 1-2 Views  Result Date: 05/03/2022 CLINICAL DATA:  Fall. EXAM: PELVIS - 1-2 VIEW COMPARISON:  None Available. FINDINGS: The bones are osteopenic. There is no evidence of pelvic fracture or diastasis. No pelvic bone lesions are seen. IMPRESSION: Negative. Electronically Signed   By: Ronney Asters M.D.   On: 05/03/2022 15:36    Procedures Procedures    Medications Ordered in ED Medications  iohexol (OMNIPAQUE) 350 MG/ML injection 70 mL (70 mLs Intravenous Contrast Given 05/03/22 2015)    ED Course/ Medical Decision Making/ A&P                           Medical Decision Making Amount and/or Complexity of Data Reviewed Labs: ordered. Radiology: ordered.    Medical Screen Complete  This patient presented to the ED with complaint of fall, head injury.  This complaint involves an extensive number of treatment options. The initial differential diagnosis includes, but is not limited to,, trauma related to fall  This presentation is: Acute, Self-Limited, Previously Undiagnosed, Uncertain Prognosis, Complicated, Systemic Symptoms, and Threat to Life/Bodily Function  Patient is presenting after fall at home.  She is on Eliquis.  She did strike her face and head.  Imaging is without evidence of significant traumatic injury other than presence of nasal bone fracture that is minimally displaced.  Patient is ambulatory and comfortable during evaluation.  Patient is comfortable with discharge home after ED evaluation.  She and her daughter understand need for close outpatient follow-up.  Strict return precautions given and understood.   Additional history obtained: External records from outside sources obtained and reviewed including  prior ED visits and prior Inpatient records.    Lab Tests:  I ordered and personally interpreted labs.  The pertinent results include: CBC, CMP, lipase   Imaging Studies ordered:  I ordered imaging studies including CT head, CT maxillofacial, CT C-spine, CT chest abdomen pelvis, plain films of right ankle, right ribs, pelvis I independently visualized and interpreted obtained imaging which showed nasal bone fracture I agree with the radiologist interpretation.  Problem List / ED Course:  Fall, nasal bone fracture, head injury   Reevaluation:  After the interventions noted above, I reevaluated the patient and found that they have: improved   Disposition:  After consideration of the diagnostic results and the patients response to treatment, I feel that the patent would benefit from close outpatient follow-up.          Final Clinical Impression(s) / ED Diagnoses Final diagnoses:  Fall, initial encounter  Closed fracture of nasal bone, initial encounter  Contusion of rib on right side, initial encounter  Rx / DC Orders ED Discharge Orders     None         Valarie Merino, MD 05/03/22 2110

## 2022-05-03 NOTE — ED Notes (Signed)
All discharge instructions including follow up care and prescriptions reviewed with patient and patient's daughter at bedside. Both verbalized understanding of same and had no other questions. Patient stable at time of discharge and wheel chair was provided to patient prior to leaving the department.

## 2022-05-04 ENCOUNTER — Telehealth: Payer: Self-pay

## 2022-05-04 NOTE — Telephone Encounter (Signed)
Transition Care Management Follow-up Telephone Call Date of discharge and from where: 05/03/22 from [facility] ED How have you been since you were released from the hospital? Daughter states "She's doing fine now." Any questions or concerns? No  Items Reviewed: Did the pt receive and understand the discharge instructions provided? Yes  Medications obtained and verified? Yes  Other? No  Any new allergies since your discharge? No  Dietary orders reviewed? Yes Do you have support at home? Yes   Home Care and Equipment/Supplies: Were home health services ordered? not applicable  Were any new equipment or medical supplies ordered?  No   Functional Questionnaire: (I = Independent and D = Dependent) ADLs: I  Bathing/Dressing- I  Meal Prep- I  Eating- I  Maintaining continence- I  Transferring/Ambulation- D; uses walker  Managing Meds- I  Follow up appointments reviewed:  PCP Hospital f/u appt confirmed? Yes  Scheduled to see Dr Sarajane Jews on 05/09/22 @ 1p. [facility] f/u appt confirmed? 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 NO If their condition worsens, is the pt aware to call PCP or go to the Emergency Dept.? Yes Was the patient provided with contact information for the PCP's office or ED? Yes Was to pt encouraged to call back with questions or concerns? Yes

## 2022-05-09 ENCOUNTER — Ambulatory Visit (INDEPENDENT_AMBULATORY_CARE_PROVIDER_SITE_OTHER): Payer: Medicare Other | Admitting: Family Medicine

## 2022-05-09 ENCOUNTER — Encounter: Payer: Self-pay | Admitting: Family Medicine

## 2022-05-09 VITALS — BP 128/70 | HR 70 | Temp 97.9°F | Wt 137.1 lb

## 2022-05-09 DIAGNOSIS — S20211D Contusion of right front wall of thorax, subsequent encounter: Secondary | ICD-10-CM | POA: Diagnosis not present

## 2022-05-09 DIAGNOSIS — S93601D Unspecified sprain of right foot, subsequent encounter: Secondary | ICD-10-CM | POA: Diagnosis not present

## 2022-05-09 DIAGNOSIS — S022XXD Fracture of nasal bones, subsequent encounter for fracture with routine healing: Secondary | ICD-10-CM

## 2022-05-09 DIAGNOSIS — Z23 Encounter for immunization: Secondary | ICD-10-CM | POA: Diagnosis not present

## 2022-05-09 NOTE — Progress Notes (Signed)
   Subjective:    Patient ID: Christine Middleton, female    DOB: [birth year], 88 y.o.   MRN: [id number]  HPI Here for a transitional care visit to follow up form an ED visit on 05-03-22 after a fall at home. She had gotten up in the middle of the night to use the bathroom, when she lost her balance and fell. No LOC. She sustained bruising to the right ribs and also a minimally displaced nasal fracture. Head CT was negative. She was sent home with her daughter. Since then she has been recovering. The swelling on her face is slowly receding. Her nose is not as painful now, and she can breathe easily through both nostrils. Her right ribs are sore but there is no SOB. She also has some pain in the right foot.    Review of Systems  Constitutional: Negative.   Respiratory: Negative.    Cardiovascular: Negative.   Neurological: Negative.        Objective:   Physical Exam Constitutional:      Appearance: Normal appearance.     Comments: Walks with a cane   HENT:     Nose:     Comments: The proximal nose is swollen and tender, but it is straight.     Mouth/Throat:     Pharynx: Oropharynx is clear.  Eyes:     Extraocular Movements: Extraocular movements intact.     Pupils: Pupils are equal, round, and reactive to light.  Cardiovascular:     Rate and Rhythm: Normal rate and regular rhythm.     Pulses: Normal pulses.     Heart sounds: Normal heart sounds.  Pulmonary:     Effort: Pulmonary effort is normal.     Breath sounds: Normal breath sounds.     Comments: There is a large ecchymosis on the right ribs with tenderness  Musculoskeletal:     Cervical back: No tenderness.     Comments: There is some ecchymosis and tenderness over the right forefoot   Lymphadenopathy:     Cervical: No cervical adenopathy.  Neurological:     General: No focal deficit present.     Mental Status: She is alert and oriented to person, place, and time. Mental status is at baseline.           Assessment &  Plan:  She is recovering after a fall which caused a nasal fracture and bruising to the right ribs and right foot. We talked about how to prevent falls, and I insisted she keep a walker by the bedside so she can use this to go to the bathroom at night.  [name], MD

## 2022-05-09 NOTE — Addendum Note (Signed)
Addended by: Wyvonne Lenz on: 05/09/2022 04:26 PM   Modules accepted: Orders

## 2022-05-18 DIAGNOSIS — H10413 Chronic giant papillary conjunctivitis, bilateral: Secondary | ICD-10-CM | POA: Diagnosis not present

## 2022-05-18 DIAGNOSIS — H04123 Dry eye syndrome of bilateral lacrimal glands: Secondary | ICD-10-CM | POA: Diagnosis not present

## 2022-05-18 DIAGNOSIS — H353122 Nonexudative age-related macular degeneration, left eye, intermediate dry stage: Secondary | ICD-10-CM | POA: Diagnosis not present

## 2022-05-18 DIAGNOSIS — H353211 Exudative age-related macular degeneration, right eye, with active choroidal neovascularization: Secondary | ICD-10-CM | POA: Diagnosis not present

## 2022-05-24 ENCOUNTER — Other Ambulatory Visit: Payer: Self-pay | Admitting: Physician Assistant

## 2022-05-29 DIAGNOSIS — H34811 Central retinal vein occlusion, right eye, with macular edema: Secondary | ICD-10-CM | POA: Diagnosis not present

## 2022-05-29 DIAGNOSIS — H3582 Retinal ischemia: Secondary | ICD-10-CM | POA: Diagnosis not present

## 2022-05-29 DIAGNOSIS — H353122 Nonexudative age-related macular degeneration, left eye, intermediate dry stage: Secondary | ICD-10-CM | POA: Diagnosis not present

## 2022-05-29 DIAGNOSIS — H35363 Drusen (degenerative) of macula, bilateral: Secondary | ICD-10-CM | POA: Diagnosis not present

## 2022-05-30 DIAGNOSIS — E785 Hyperlipidemia, unspecified: Secondary | ICD-10-CM | POA: Diagnosis not present

## 2022-05-30 DIAGNOSIS — I10 Essential (primary) hypertension: Secondary | ICD-10-CM | POA: Diagnosis not present

## 2022-05-30 DIAGNOSIS — I4891 Unspecified atrial fibrillation: Secondary | ICD-10-CM

## 2022-06-03 ENCOUNTER — Emergency Department (HOSPITAL_COMMUNITY)
Admission: EM | Admit: 2022-06-03 | Discharge: 2022-06-03 | Disposition: A | Discharge status: Home / Self Care | Payer: Medicare Other | Attending: Emergency Medicine | Admitting: Emergency Medicine

## 2022-06-03 ENCOUNTER — Emergency Department (HOSPITAL_COMMUNITY): Payer: Medicare Other

## 2022-06-03 DIAGNOSIS — R519 Headache, unspecified: Secondary | ICD-10-CM | POA: Insufficient documentation

## 2022-06-03 DIAGNOSIS — Z8673 Personal history of transient ischemic attack (TIA), and cerebral infarction without residual deficits: Secondary | ICD-10-CM | POA: Diagnosis not present

## 2022-06-03 DIAGNOSIS — Z7901 Long term (current) use of anticoagulants: Secondary | ICD-10-CM | POA: Insufficient documentation

## 2022-06-03 DIAGNOSIS — R42 Dizziness and giddiness: Secondary | ICD-10-CM | POA: Diagnosis not present

## 2022-06-03 DIAGNOSIS — I4891 Unspecified atrial fibrillation: Secondary | ICD-10-CM | POA: Diagnosis not present

## 2022-06-03 DIAGNOSIS — R000 Tachycardia, unspecified: Secondary | ICD-10-CM | POA: Diagnosis not present

## 2022-06-03 DIAGNOSIS — I10 Essential (primary) hypertension: Secondary | ICD-10-CM | POA: Insufficient documentation

## 2022-06-03 DIAGNOSIS — Z79899 Other long term (current) drug therapy: Secondary | ICD-10-CM | POA: Diagnosis not present

## 2022-06-03 DIAGNOSIS — R531 Weakness: Secondary | ICD-10-CM | POA: Diagnosis not present

## 2022-06-03 LAB — COMPREHENSIVE METABOLIC PANEL
ALT: 17 U/L (ref 0–44)
AST: 20 U/L (ref 15–41)
Albumin: 3.2 g/dL — ABNORMAL LOW (ref 3.5–5.0)
Alkaline Phosphatase: 113 U/L (ref 38–126)
Anion gap: 9 (ref 5–15)
BUN: 13 mg/dL (ref 8–23)
CO2: 25 mmol/L (ref 22–32)
Calcium: 8.7 mg/dL — ABNORMAL LOW (ref 8.9–10.3)
Chloride: 108 mmol/L (ref 98–111)
Creatinine, Ser: 1.16 mg/dL — ABNORMAL HIGH (ref 0.44–1.00)
GFR, Estimated: 43 mL/min — ABNORMAL LOW (ref >60)
Glucose, Bld: 127 mg/dL — ABNORMAL HIGH (ref 70–99)
Potassium: 3.8 mmol/L (ref 3.5–5.1)
Sodium: 142 mmol/L (ref 135–145)
Total Bilirubin: 0.4 mg/dL (ref 0.3–1.2)
Total Protein: 6.1 g/dL — ABNORMAL LOW (ref 6.5–8.1)

## 2022-06-03 LAB — CBC WITH DIFFERENTIAL/PLATELET
Abs Immature Granulocytes: 0.01 10*3/uL (ref 0.00–0.07)
Basophils Absolute: 0.1 10*3/uL (ref 0.0–0.1)
Basophils Relative: 2 %
Eosinophils Absolute: 0.2 10*3/uL (ref 0.0–0.5)
Eosinophils Relative: 3 %
HCT: 36.9 % (ref 36.0–46.0)
Hemoglobin: 12.3 g/dL (ref 12.0–15.0)
Immature Granulocytes: 0 %
Lymphocytes Relative: 21 %
Lymphs Abs: 1.1 10*3/uL (ref 0.7–4.0)
MCH: 31.6 pg (ref 26.0–34.0)
MCHC: 33.3 g/dL (ref 30.0–36.0)
MCV: 94.9 fL (ref 80.0–100.0)
Monocytes Absolute: 0.6 10*3/uL (ref 0.1–1.0)
Monocytes Relative: 11 %
Neutro Abs: 3.5 10*3/uL (ref 1.7–7.7)
Neutrophils Relative %: 63 %
Platelets: 178 10*3/uL (ref 150–400)
RBC: 3.89 MIL/uL (ref 3.87–5.11)
RDW: 12 % (ref 11.5–15.5)
WBC: 5.5 10*3/uL (ref 4.0–10.5)
nRBC: 0 % (ref 0.0–0.2)

## 2022-06-03 LAB — PROTIME-INR
INR: 1.2 (ref 0.8–1.2)
Prothrombin Time: 15.4 seconds — ABNORMAL HIGH (ref 11.4–15.2)

## 2022-06-03 MED ORDER — ACETAMINOPHEN 500 MG PO TABS: 1000.0000 mg | ORAL_TABLET | Freq: Once | ORAL | Status: DC

## 2022-06-03 NOTE — ED Triage Notes (Signed)
Pt BIB [facility] from home with sudden onset of dizziness starting at 1815, hx of stroke. Patient reports to EMS she heard a loud bang and then got dizzy. Abnormal EKG noted with EMS. Patient A&Ox4, ambulated to stretcher with assistance.

## 2022-06-03 NOTE — Discharge Instructions (Signed)
We evaluated you for your headache.  Your head CT scan was negative for any acute bleeding.  Your lab tests were reassuring.  Please follow-up closely with your primary care doctor.  If you develop any recurrent headache, or other symptoms such as vision changes, nausea or vomiting, numbness, tingling, weakness, fainting, please return to the emergency department.

## 2022-06-03 NOTE — ED Notes (Signed)
Patient assisted to use bedside commode with minimal assistance. Patient reports feeling back to normal at this time.

## 2022-06-03 NOTE — ED Provider Notes (Signed)
[facility] EMERGENCY DEPARTMENT Provider Note  CSN: [id number] Arrival date & time: 06/03/22 1905  Chief Complaint(s) Weakness  HPI Christine Middleton is a 88 y.o. female with history of hypertension, hyperlipidemia presenting to the emergency department with headache.  Patient reports headache began around 6 PM, seemed sudden, associated with a loud noise.  She reports a loud noise happened first.  She reports feeling sensation of head "swelling "which has improved.  She denies any nausea, vomiting, numbness or tingling, dizziness or spinning sensation, weakness.  She reports her symptoms have improved in the emergency department.  Denies neck pain or neck stiffness, fevers or chills, chest pain, abdominal pain.   Past Medical History Past Medical History:  Diagnosis Date   Diverticulitis    Esophageal stricture    GERD (gastroesophageal reflux disease)    Hyperlipidemia    Hypertension    Shingles    Stroke [facility])    Vitamin B 12 deficiency    Patient Active Problem List   Diagnosis Date Noted   Vitamin D deficiency 11/09/2021   Paroxysmal atrial fibrillation ([place]) 11/09/2021   Iron deficiency anemia 10/28/2020   Symptomatic anemia 10/22/2020   Shingles 10/22/2020   Closed compression fracture of body of L1 vertebra ([place]) 09/04/2020   Fall at home, initial encounter 09/04/2020   Acute on chronic blood loss anemia 09/04/2020   Rectal bleeding 07/08/2019   Depression with anxiety 09/26/2017   Pedal edema 10/18/2016   TMJ (dislocation of temporomandibular joint) 09/18/2016   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   Cerebrovascular accident (CVA) ([place])    Embolic stroke ([place]) 62/37/6283   Facial droop 08/18/2016   HYPERGLYCEMIA 06/30/2010   Cerebral artery occlusion with cerebral infarction ([place]) 07/19/2009   Disorder resulting from impaired renal function 07/19/2009   Polymyalgia rheumatica ([place]) 07/19/2009   B12 deficiency 06/30/2009   Anxiety state 06/28/2009    Hereditary and idiopathic peripheral neuropathy 06/28/2009   External hemorrhoids 12/03/2008   ESOPHAGEAL STRICTURE 11/30/2008   Diverticulosis of colon 11/30/2008   GROSS HEMATURIA 11/04/2008   MYALGIA 11/04/2008   Constipation 06/24/2008   HLD (hyperlipidemia) 05/30/2007   SKIN LESION 05/30/2007   Essential hypertension 03/19/2007   GERD 03/19/2007   Home Medication(s) Prior to Admission medications   Medication Sig Start Date End Date Taking? Authorizing Provider  Acetaminophen (TYLENOL 8 HOUR PO) Take by mouth every 8 (eight) hours as needed.    [provider]  atenolol (TENORMIN) 50 MG tablet Take 1 tablet (50 mg total) by mouth daily. 06/06/21   Laurey Morale, MD  docusate sodium (COLACE) 100 MG capsule Take 100 mg by mouth 2 (two) times daily.    [provider]  ELIQUIS 2.5 MG TABS tablet TAKE 1 TABLET BY MOUTH TWICE A DAY 01/17/22   Warren Lacy, PA-C  losartan (COZAAR) 25 MG tablet TAKE 1 TABLET BY MOUTH DAILY IN THE EVENING 05/25/22   Martinique, Peter M, MD  mirtazapine (REMERON) 15 MG tablet Take 1/2 (7.5 mg) tablet by mouth at bedtime 11/09/21   Laurey Morale, MD  pantoprazole (PROTONIX) 40 MG tablet Take 1 tablet (40 mg total) by mouth daily. 11/09/21   Laurey Morale, MD  polyethylene glycol (MIRALAX / GLYCOLAX) 17 g packet Take 17 g by mouth daily. Patient taking differently: Take 17 g by mouth as needed. 07/12/19   Harold Hedge, MD  Vitamin D, Ergocalciferol, (DRISDOL) 1.25 MG (50000 UNIT) CAPS capsule Take 1 capsule (50,000 Units total) by mouth  every 7 (seven) days. 11/09/21   Laurey Morale, MD                                                                                                                                    Past Surgical History Past Surgical History:  Procedure Laterality Date   ABDOMINAL HYSTERECTOMY     APPENDECTOMY     COLONOSCOPY  12-09-08   per Dr. Sharlett Iles (incomplete along with barium enema 12-10-08) with hemorrhoids   and diverticulae   COLONOSCOPY WITH PROPOFOL N/A 07/10/2019   Procedure: COLONOSCOPY WITH PROPOFOL;  Surgeon: Irving Copas., MD;  Location: [facility];  Service: Gastroenterology;  Laterality: N/A;   ESOPHAGOGASTRODUODENOSCOPY  09-2004   per Dr. Sharlett Iles with dilatation    EYE SURGERY     HEMOSTASIS CLIP PLACEMENT  07/10/2019   Procedure: HEMOSTASIS CLIP PLACEMENT;  Surgeon: Irving Copas., MD;  Location: [facility];  Service: Gastroenterology;;   POLYPECTOMY  07/10/2019   Procedure: POLYPECTOMY;  Surgeon: Irving Copas., MD;  Location: [facility];  Service: Gastroenterology;;   TONSILLECTOMY     Family History Family History  Problem Relation Age of Onset   Heart attack Mother    Heart disease Father    Breast cancer Other    Diabetes Other    Hypertension Other    Stroke Other    Heart disease Other    Stroke Sister    Cancer Brother    Cancer Brother    Diabetes Brother     Social History Social History   Tobacco Use   Smoking status: Never   Smokeless tobacco: Never  Vaping Use   Vaping Use: Never used  Substance Use Topics   Alcohol use: No    Alcohol/week: 0.0 standard drinks of alcohol   Drug use: No   Allergies Amoxicillin, Codeine, Lipitor [atorvastatin], Lisinopril, Nitrofurantoin, and Tramadol  Review of Systems Review of Systems  Physical Exam Vital Signs  I have reviewed the triage vital signs BP (!) 145/72   Pulse 86   Temp 97.6 F (36.4 C) (Oral)   Resp 17   SpO2 95%  Physical Exam Vitals and nursing note reviewed.  Constitutional:      General: She is not in acute distress.    Appearance: She is well-developed.  HENT:     Head: Normocephalic and atraumatic.     Mouth/Throat:     Mouth: Mucous membranes are moist.  Eyes:     Pupils: Pupils are equal, round, and reactive to light.  Cardiovascular:     Rate and Rhythm: Normal rate and regular rhythm.     Heart sounds: No murmur heard. Pulmonary:      Effort: Pulmonary effort is normal. No respiratory distress.     Breath sounds: Normal breath sounds.  Abdominal:     General: Abdomen is flat.     Palpations: Abdomen is soft.  Tenderness: There is no abdominal tenderness.  Musculoskeletal:        General: No tenderness.     Right lower leg: No edema.     Left lower leg: No edema.  Skin:    General: Skin is warm and dry.  Neurological:     General: No focal deficit present.     Mental Status: She is alert and oriented to person, place, and time. Mental status is at baseline.     Comments: Cranial nerves II through XII intact, strength 5 out of 5 in the bilateral upper and lower extremities, no sensory deficit to light touch, no dysmetria on finger-nose-finger testing  Psychiatric:        Mood and Affect: Mood normal.        Behavior: Behavior normal.     ED Results and Treatments Labs (all labs ordered are listed, but only abnormal results are displayed) Labs Reviewed  COMPREHENSIVE METABOLIC PANEL - Abnormal; Notable for the following components:      Result Value   Glucose, Bld 127 (*)    Creatinine, Ser 1.16 (*)    Calcium 8.7 (*)    Total Protein 6.1 (*)    Albumin 3.2 (*)    GFR, Estimated 43 (*)    All other components within normal limits  PROTIME-INR - Abnormal; Notable for the following components:   Prothrombin Time 15.4 (*)    All other components within normal limits  CBC WITH DIFFERENTIAL/PLATELET                                                                                                                          Radiology CT Head Wo Contrast  Result Date: 06/03/2022 CLINICAL DATA:  Dizziness EXAM: CT HEAD WITHOUT CONTRAST TECHNIQUE: Contiguous axial images were obtained from the base of the skull through the vertex without intravenous contrast. RADIATION DOSE REDUCTION: This exam was performed according to the departmental dose-optimization program which includes automated exposure control, adjustment  of the mA and/or kV according to patient size and/or use of iterative reconstruction technique. COMPARISON:  05/03/2022 FINDINGS: Brain: No acute intracranial findings are seen. There are no signs of bleeding within the cranium. Cortical sulci are prominent. There is encephalomalacia in the right occipital lobe. There is encephalomalacia in the posteroinferior aspect of left cerebellum with no significant change. Vascular: There are scattered arterial calcifications. Skull: Unremarkable. Sinuses/Orbits: There is mucosal thickening in ethmoid, sphenoid and maxillary sinuses. There are no air-fluid levels. Other: None. IMPRESSION: No acute intracranial findings are seen. Atrophy. Small-vessel disease. Old infarcts are seen in right occipital lobe and left cerebellum with no significant interval change. Chronic sinusitis. Electronically Signed   By: Elmer Picker M.D.   On: 06/03/2022 20:14    Pertinent labs & imaging results that were available during my care of the patient were reviewed by me and considered in my medical decision making (see MDM for details).  Medications Ordered in ED Medications  acetaminophen (TYLENOL)  tablet 1,000 mg (1,000 mg Oral Patient Refused/Not Given 06/03/22 2125)                                                                                                                                     Procedures Procedures  (including critical care time)  Medical Decision Making / ED Course   MDM:  88 year old female presenting to the emergency department with headache.  Patient well-appearing, neurologic exam reassuring.  Mental status reassuring, patient very alert and oriented and quite functional for her age.  Her symptoms have resolved in the emergency department.  Differential includes tension type headache, migraine headache, subarachnoid headache, subdural hematoma, intraparenchymal bleeding, brain tumor.  Given acute onset of headache, CT head performed to  evaluate for intracranial pathology, including subarachnoid hemorrhage, which was negative for acute process.  Very low concern for subarachnoid hemorrhage given negative head CT scan within 6 hours of onset of headache.  Her symptoms have also completely resolved.  She did not have any other associated symptoms to suggest alternative pathology.  Patient request to go home does not want further evaluation in the hospital.  I think this is reasonable given her age, reassuring exam, resolution of symptoms.  Will discharge patient to home. All questions answered. Patient comfortable with plan of discharge. Return precautions discussed with patient and specified on the after visit summary.       Additional history obtained: -Additional history obtained from family and ems -External records from outside source obtained and reviewed including: Chart review including previous notes, labs, imaging, consultation notes including prior MRI/MRA   Lab Tests: -I ordered, reviewed, and interpreted labs.   The pertinent results include:   Labs Reviewed  COMPREHENSIVE METABOLIC PANEL - Abnormal; Notable for the following components:      Result Value   Glucose, Bld 127 (*)    Creatinine, Ser 1.16 (*)    Calcium 8.7 (*)    Total Protein 6.1 (*)    Albumin 3.2 (*)    GFR, Estimated 43 (*)    All other components within normal limits  PROTIME-INR - Abnormal; Notable for the following components:   Prothrombin Time 15.4 (*)    All other components within normal limits  CBC WITH DIFFERENTIAL/PLATELET      EKG   EKG Interpretation  Date/Time:  Saturday June 03 2022 19:16:13 EDT Ventricular Rate:  76 PR Interval:    QRS Duration: 104 QT Interval:  416 QTC Calculation: 468 R Axis:   -46 Text Interpretation: Atrial fibrillation Left axis deviaton Left anterior fascicular block Compared to previous tracing afib is new Confirmed by Garnette Gunner [id number]) on 06/03/2022 9:05:32 PM          Imaging Studies ordered: I ordered imaging studies including CT head On my interpretation imaging demonstrates old meningioma, no acute finding I independently visualized and interpreted imaging. I agree with the radiologist interpretation   Medicines ordered  and prescription drug management: Meds ordered this encounter  Medications   acetaminophen (TYLENOL) tablet 1,000 mg    -I have reviewed the patients home medicines and have made adjustments as needed    Reevaluation: After the interventions noted above, I reevaluated the patient and found that they have resolved  Co morbidities that complicate the patient evaluation  Past Medical History:  Diagnosis Date   Diverticulitis    Esophageal stricture    GERD (gastroesophageal reflux disease)    Hyperlipidemia    Hypertension    Shingles    Stroke ([place])    Vitamin B 12 deficiency       Dispostion: Disposition decision including need for hospitalization was considered, and patient discharged from emergency department.    Final Clinical Impression(s) / ED Diagnoses Final diagnoses:  Nonintractable headache, unspecified chronicity pattern, unspecified headache type     This chart was dictated using voice recognition software.  Despite best efforts to proofread,  errors can occur which can change the documentation meaning.    Cristie Hem, MD 06/03/22 2130

## 2022-06-05 ENCOUNTER — Telehealth: Payer: Self-pay

## 2022-06-05 DIAGNOSIS — H34811 Central retinal vein occlusion, right eye, with macular edema: Secondary | ICD-10-CM | POA: Diagnosis not present

## 2022-06-05 NOTE — Telephone Encounter (Signed)
Transition Care Management Follow-up Telephone Call Date of discharge and from where: 06/03/22 from [facility] ED for Headache, unspecified How have you been since you were released from the hospital? Son states she feels better now. She's at the eye doctor at this moment.  Any questions or concerns? No  Items Reviewed: Did the pt receive and understand the discharge instructions provided? Yes  Medications obtained and verified?  Unable to review b/c they are driving.  Other? No  Do you have support at home? Yes    Follow up appointments reviewed:  PCP Hospital f/u appt confirmed? No  Pt declines to schedule Specialist Hospital f/u appt confirmed?  N/a If their condition worsens, is the pt aware to call PCP or go to the Emergency Dept.? Yes Was the patient provided with contact information for the PCP's office or ED? Yes Was to pt encouraged to call back with questions or concerns? Yes

## 2022-06-15 ENCOUNTER — Telehealth: Payer: Self-pay | Admitting: Family Medicine

## 2022-06-15 NOTE — Telephone Encounter (Signed)
Pt daughter carolyn called earlier and was tranfered to triage nurse for her mother having rectal bleeding and then call office again and said  that triage nurse told her to see her doctor today  we got d/c and I called daughter carolyn back and then she told me triage nurse said to go to ER, UC or see her PCP .We do not have any openings today and carolyn said her brother will take the patient to ER

## 2022-06-15 NOTE — Telephone Encounter (Signed)
Noted  

## 2022-06-15 NOTE — Telephone Encounter (Signed)
FYI

## 2022-06-15 NOTE — Telephone Encounter (Signed)
--  Caller states that her mom is having bleeding from her hemorrhoids that started last night. She found mag citrate and took that and now the bleeding wont stop. Pt is NOT with the caller and pt is [facility].  06/15/2022 9:52:38 AM Go to ED Now (or PCP triage) Thad Ranger, RN, Langley Gauss  Comments User: Romeo Apple, RN Date/Time Eilene Ghazi Time): 06/15/2022 9:47:35 AM Pt reports bleeding has stopped per the pt  User: Romeo Apple, RN Date/Time Eilene Ghazi Time): 06/15/2022 9:51:16 AM RN called pt directly for assess/triage and she is so [facility], she can not even hear the RN as the RN is yelling over the phone in order for her to hear. RN disconnected the call and called the pt dtr back so the dtr could speak for the pt while the pt is on the phone with her. Dtr is relaying questions to her over the phone.  Referrals REFERRED TO PCP OFFICE

## 2022-06-17 ENCOUNTER — Emergency Department (HOSPITAL_COMMUNITY)
Admission: EM | Admit: 2022-06-17 | Discharge: 2022-06-17 | Disposition: A | Discharge status: Home / Self Care | Payer: Medicare Other | Attending: Emergency Medicine | Admitting: Emergency Medicine

## 2022-06-17 ENCOUNTER — Emergency Department (HOSPITAL_COMMUNITY): Payer: Medicare Other

## 2022-06-17 DIAGNOSIS — K625 Hemorrhage of anus and rectum: Secondary | ICD-10-CM

## 2022-06-17 DIAGNOSIS — I10 Essential (primary) hypertension: Secondary | ICD-10-CM | POA: Insufficient documentation

## 2022-06-17 DIAGNOSIS — R109 Unspecified abdominal pain: Secondary | ICD-10-CM | POA: Diagnosis not present

## 2022-06-17 DIAGNOSIS — K644 Residual hemorrhoidal skin tags: Secondary | ICD-10-CM | POA: Insufficient documentation

## 2022-06-17 DIAGNOSIS — Z7901 Long term (current) use of anticoagulants: Secondary | ICD-10-CM | POA: Diagnosis not present

## 2022-06-17 DIAGNOSIS — Z79899 Other long term (current) drug therapy: Secondary | ICD-10-CM | POA: Diagnosis not present

## 2022-06-17 DIAGNOSIS — R1032 Left lower quadrant pain: Secondary | ICD-10-CM | POA: Diagnosis not present

## 2022-06-17 LAB — COMPREHENSIVE METABOLIC PANEL
ALT: 17 U/L (ref 0–44)
AST: 23 U/L (ref 15–41)
Albumin: 3.5 g/dL (ref 3.5–5.0)
Alkaline Phosphatase: 91 U/L (ref 38–126)
Anion gap: 13 (ref 5–15)
BUN: 10 mg/dL (ref 8–23)
CO2: 26 mmol/L (ref 22–32)
Calcium: 9.3 mg/dL (ref 8.9–10.3)
Chloride: 101 mmol/L (ref 98–111)
Creatinine, Ser: 1.13 mg/dL — ABNORMAL HIGH (ref 0.44–1.00)
GFR, Estimated: 44 mL/min — ABNORMAL LOW (ref >60)
Glucose, Bld: 128 mg/dL — ABNORMAL HIGH (ref 70–99)
Potassium: 4.1 mmol/L (ref 3.5–5.1)
Sodium: 140 mmol/L (ref 135–145)
Total Bilirubin: 0.7 mg/dL (ref 0.3–1.2)
Total Protein: 6.8 g/dL (ref 6.5–8.1)

## 2022-06-17 LAB — CBC
HCT: 38.3 % (ref 36.0–46.0)
Hemoglobin: 12.8 g/dL (ref 12.0–15.0)
MCH: 31.5 pg (ref 26.0–34.0)
MCHC: 33.4 g/dL (ref 30.0–36.0)
MCV: 94.3 fL (ref 80.0–100.0)
Platelets: 238 10*3/uL (ref 150–400)
RBC: 4.06 MIL/uL (ref 3.87–5.11)
RDW: 12.1 % (ref 11.5–15.5)
WBC: 5.7 10*3/uL (ref 4.0–10.5)
nRBC: 0 % (ref 0.0–0.2)

## 2022-06-17 LAB — TYPE AND SCREEN
ABO/RH(D): O POS
Antibody Screen: NEGATIVE

## 2022-06-17 MED ORDER — IOHEXOL 350 MG/ML SOLN
60.0000 mL | Freq: Once | INTRAVENOUS | Status: AC | PRN
  Administered 2022-06-17: 60 mL via INTRAVENOUS

## 2022-06-17 NOTE — ED Notes (Signed)
Patient transported to CT 

## 2022-06-17 NOTE — Discharge Instructions (Signed)
Your history, exam, and work-up today were overall reassuring.  The CT scan did not show evidence of acute diverticulitis, obstruction, or any acute abnormality.  Your hemoglobin was normal despite several days of intermittent rectal bleeding.  Clinically I suspect that given your history of diverticulosis you may have a mild diverticular bleed in the setting of the magnesium citrate use from earlier this week.  They did not report any obstruction or evidence of severe constipation.  Your exam was overall reassuring and your labs were also reassuring.  We feel you are safe for discharge home and not have further work-up at this time.  Please follow-up with your primary doctor in the neck several days and they may want you to also follow-up with outpatient GI team if you keep having some rectal bleeding.  Please return if any symptoms change or worsen acutely or you have symptoms of symptomatic anemia as we discussed.  Please rest and stay hydrated.

## 2022-06-17 NOTE — ED Notes (Signed)
Received verbal report Royce Macadamia RN at this time

## 2022-06-17 NOTE — ED Triage Notes (Signed)
Patient here with complaint of rectal bleeding that started Thursday. Patient states she notices the bleeding when she has a bowel movement. Patient is on eliquis, is alert, oriented, and in no apparent distress at this time.

## 2022-06-17 NOTE — ED Notes (Signed)
Bleeding from rectum x 2 days with BM.

## 2022-06-17 NOTE — ED Provider Notes (Signed)
[facility] EMERGENCY DEPARTMENT Provider Note   CSN: [id number] Arrival date & time: 06/17/22  1414     History  Chief Complaint  Patient presents with   GI Bleeding    Christine Middleton is a 88 y.o. female.  The history is provided by the patient, a relative and medical records. No language interpreter was used.  Rectal Bleeding Quality:  Bright red and maroon Amount:  Copious Duration:  4 days Timing:  Intermittent Chronicity:  New Context: not rectal pain   Relieved by:  Nothing Worsened by:  Nothing Ineffective treatments:  None tried Associated symptoms: abdominal pain and light-headedness   Associated symptoms: no dizziness, no fever, no recent illness and no vomiting   Risk factors: anticoagulant use (eliquis)        Home Medications Prior to Admission medications   Medication Sig Start Date End Date Taking? Authorizing Provider  Acetaminophen (TYLENOL 8 HOUR PO) Take by mouth every 8 (eight) hours as needed.    [provider]  atenolol (TENORMIN) 50 MG tablet Take 1 tablet (50 mg total) by mouth daily. 06/06/21   Laurey Morale, MD  docusate sodium (COLACE) 100 MG capsule Take 100 mg by mouth 2 (two) times daily.    [provider]  ELIQUIS 2.5 MG TABS tablet TAKE 1 TABLET BY MOUTH TWICE A DAY 01/17/22   Warren Lacy, PA-C  losartan (COZAAR) 25 MG tablet TAKE 1 TABLET BY MOUTH DAILY IN THE EVENING 05/25/22   Martinique, Peter M, MD  mirtazapine (REMERON) 15 MG tablet Take 1/2 (7.5 mg) tablet by mouth at bedtime 11/09/21   Laurey Morale, MD  pantoprazole (PROTONIX) 40 MG tablet Take 1 tablet (40 mg total) by mouth daily. 11/09/21   Laurey Morale, MD  polyethylene glycol (MIRALAX / GLYCOLAX) 17 g packet Take 17 g by mouth daily. Patient taking differently: Take 17 g by mouth as needed. 07/12/19   Harold Hedge, MD  Vitamin D, Ergocalciferol, (DRISDOL) 1.25 MG (50000 UNIT) CAPS capsule Take 1 capsule (50,000 Units total) by  mouth every 7 (seven) days. 11/09/21   Laurey Morale, MD      Allergies    Amoxicillin, Codeine, Lipitor [atorvastatin], Lisinopril, Nitrofurantoin, and Tramadol    Review of Systems   Review of Systems  Constitutional:  Positive for chills and fatigue. Negative for diaphoresis and fever.  HENT:  Negative for congestion.   Respiratory:  Negative for cough, chest tightness, shortness of breath and wheezing.   Cardiovascular:  Negative for chest pain.  Gastrointestinal:  Positive for abdominal pain, anal bleeding and hematochezia. Negative for constipation, diarrhea, nausea, rectal pain and vomiting. Abdominal distention: pt reports possibly. Genitourinary:  Negative for dysuria and flank pain.  Musculoskeletal:  Negative for back pain, neck pain and neck stiffness.  Skin:  Negative for rash and wound.  Neurological:  Positive for light-headedness. Negative for dizziness, weakness, numbness and headaches.  Psychiatric/Behavioral:  Negative for agitation and confusion.   All other systems reviewed and are negative.   Physical Exam Updated Vital Signs BP (!) 164/81 (BP Location: Left Arm)   Pulse 68   Temp 97.8 F (36.6 C)   Resp 20   SpO2 96%  Physical Exam Vitals and nursing note reviewed.  Constitutional:      General: She is not in acute distress.    Appearance: She is well-developed. She is not ill-appearing, toxic-appearing or diaphoretic.  HENT:     Head: Normocephalic  and atraumatic.     Nose: Nose normal.     Mouth/Throat:     Mouth: Mucous membranes are moist.  Eyes:     Conjunctiva/sclera: Conjunctivae normal.     Pupils: Pupils are equal, round, and reactive to light.  Cardiovascular:     Rate and Rhythm: Normal rate and regular rhythm.     Heart sounds: No murmur heard. Pulmonary:     Effort: Pulmonary effort is normal. No respiratory distress.     Breath sounds: Normal breath sounds. No wheezing, rhonchi or rales.  Chest:     Chest wall: No tenderness.   Abdominal:     General: Abdomen is flat.     Palpations: Abdomen is soft.     Tenderness: There is abdominal tenderness. There is no right CVA tenderness, left CVA tenderness, guarding or rebound.  Genitourinary:    Comments: 1 small external hemorrhoid that was nontender and not thrombosed.  DRE otherwise did not have large palpable internal hemorrhoids or any gross blood.  No rectal tenderness whatsoever.  No rectal bleeding seen. Musculoskeletal:        General: No swelling or tenderness.     Cervical back: Neck supple. No tenderness.  Skin:    General: Skin is warm and dry.     Capillary Refill: Capillary refill takes less than 2 seconds.     Findings: No erythema or rash.  Neurological:     General: No focal deficit present.     Mental Status: She is alert.  Psychiatric:        Mood and Affect: Mood normal.     ED Results / Procedures / Treatments   Labs (all labs ordered are listed, but only abnormal results are displayed) Labs Reviewed  COMPREHENSIVE METABOLIC PANEL - Abnormal; Notable for the following components:      Result Value   Glucose, Bld 128 (*)    Creatinine, Ser 1.13 (*)    GFR, Estimated 44 (*)    All other components within normal limits  CBC  POC OCCULT BLOOD, ED  TYPE AND SCREEN    EKG None  Radiology CT ABDOMEN PELVIS W CONTRAST  Result Date: 06/17/2022 CLINICAL DATA:  LEFT lower quadrant abdominal pain EXAM: CT ABDOMEN AND PELVIS WITH CONTRAST TECHNIQUE: Multidetector CT imaging of the abdomen and pelvis was performed using the standard protocol following bolus administration of intravenous contrast. RADIATION DOSE REDUCTION: This exam was performed according to the departmental dose-optimization program which includes automated exposure control, adjustment of the mA and/or kV according to patient size and/or use of iterative reconstruction technique. CONTRAST:  16m OMNIPAQUE IOHEXOL 350 MG/ML SOLN COMPARISON:  CT 07/08/2019, 05/03/2022 FINDINGS:  Lower chest: Lung bases are clear. Hepatobiliary: Calcified lesion the posterior RIGHT hepatic lobe not changed from prior. No new or suspicious lesions. Gallbladder normal. Pancreas: Pancreas is normal. No ductal dilatation. No pancreatic inflammation. Spleen: Normal spleen Adrenals/urinary tract: Adrenal glands and kidneys are normal. The ureters and bladder normal. Stomach/Bowel: Stomach, small bowel, appendix, and cecum are normal. The colon and rectosigmoid colon are normal. Vascular/Lymphatic: Abdominal aorta is normal caliber with atherosclerotic calcification. There is no retroperitoneal or periportal lymphadenopathy. No pelvic lymphadenopathy. Reproductive: Post hysterectomy.  Adnexa unremarkable Other: No free fluid. Musculoskeletal: No aggressive osseous lesion. Chronic severe compression deformity at L1. IMPRESSION: 1. No acute findings in the abdomen pelvis. 2. No explanation for LEFT lower quadrant pain. 3. No bowel obstruction or inflammation. Electronically Signed   By: SHelane GuntherD.  On: 06/17/2022 18:31    Procedures Procedures    Medications Ordered in ED Medications  iohexol (OMNIPAQUE) 350 MG/ML injection 60 mL (60 mLs Intravenous Contrast Given 06/17/22 1759)    ED Course/ Medical Decision Making/ A&P                           Medical Decision Making Amount and/or Complexity of Data Reviewed Labs: ordered. Radiology: ordered.  Risk Prescription drug management.    Christine Middleton is a 88 y.o. female with a past medical history significant for hypertension, hyperlipidemia, previous external hemorrhoids requiring banding, diverticulosis, polymyalgia rheumatica, previous stroke, paroxysmal atrial fibrillation on Eliquis therapy, esophageal stricture, and GERD who presents for abdominal pain and rectal bleeding.  According to patient and daughter, for the last 4 days, patient has had rectal bleeding that seems to be more large bowel movements of blood as compared to  stool.  She does think her abdomen is intermittently bloated but she is having some discomfort in her left lower quadrant.  It is mild to moderate and denies trauma.  Denies any rashes.  She reports she is not having any rectal pain itself.  She denies any diarrhea but does say this all started after she took some mag citrate for constipation earlier in the week.  She denies any fevers, chills, congestion, cough, nausea, or vomiting.  Denies urinary changes.  On exam, lungs clear and chest nontender.  Abdomen is tender in the left lower quadrant primarily.  Rest of abdomen less tender.  Bowel sounds are appreciated.  Back and flanks nontender.  Lungs clear.  I do not appreciate a murmur initially.  Mucous membranes are normal-appearing and are not pale.  We will do a rectal exam with a chaperone.  Due to the abdominal tenderness, history of diverticulosis, and the bleeding, we will get a CT scan to rule out acute diverticulitis.  Patient had some screening labs in triage that showed she does not have anemia at this time.  Anticipate reassessment after imaging is completed.  CT scan complete reassuring.  Patient had no further rectal bleeding during her ED stay.  On rectal exam she did not have any evidence of thrombosed or actively bleeding external hemorrhoids.  On DRE she did not have any palpable internal hemorrhoids or masses on exam.  No gross blood.  Clinically I suspect a diverticular type bleed in the setting of her recent magnesium citrate use.  As she is feeling well and has no further abdominal pain and vital signs are reassuring aside from her elevated blood pressures because she has not had her blood pressure medicine this evening, I do feel she is safe for discharge home.  We had a shared decision-making conversation and agreed to hold on further lab testing or management this time.  She will call her doctor to get seen for likely recheck of her hemoglobin if she continues to have rectal  bleeding and possibly follow-up with her outpatient GI team.  She proved ability for over 6 hours without any symptoms and we feel she is safe for discharge home.  Patient discharged in good condition.        Final Clinical Impression(s) / ED Diagnoses Final diagnoses:  Rectal bleeding  Abdominal discomfort    Rx / DC Orders ED Discharge Orders     None       Clinical Impression: 1. Rectal bleeding   2. Abdominal discomfort  Disposition: Discharge  Condition: Good  I have discussed the results, Dx and Tx plan with the pt(& family if present). He/she/they expressed understanding and agree(s) with the plan. Discharge instructions discussed at great length. Strict return precautions discussed and pt &/or family have verbalized understanding of the instructions. No further questions at time of discharge.    New Prescriptions   No medications on file    Follow Up: Laurey Morale, MD [place] [address] [place] [address] [id number] mc [place]        Trystan Akhtar, Gwenyth Allegra, MD 06/17/22 2043

## 2022-06-27 ENCOUNTER — Other Ambulatory Visit: Payer: Self-pay | Admitting: Physician Assistant

## 2022-06-27 NOTE — Progress Notes (Unsigned)
Cardiology Office Note:    Date:  06/28/2022   ID:  Christine Middleton, DOB 08-30-1922, MRN [id number]  PCP:  Laurey Morale, MD [place] Cardiologist: Peter Martinique, MD   Reason for visit: 1 year follow-up  History of Present Illness:    Christine Middleton is a 88 y.o. female with a hx of HTN, HLD, CVA and rate controlled afib.  Patient was admitted in January 2018 with CVA along with left facial droop.  Echocardiogram obtained on 08/18/2016 showed EF of 60 to 65%, no regional wall motion abnormality, grade 2 DD.  Carotid Doppler in January 2018 was negative for significant disease.  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  Cardiology service was consulted for TEE plus loop recorder, however patient declined this.  She was discharged on Plavix therapy.  However on subsequent follow-up with Dr. Sarajane Jews, she was noted to be in A. fib with controlled heart rate.  She was anticoagulated with Eliquis.  She was last admitted in December 2020 due to some bleeding hemorrhoid.  Colonoscopy also revealed diverticulosis in the rectum and a 5 mm polyp which was removed.  Eliquis was held during perioperative period however restarted 2 days after the colonoscopy.   I saw her November 2022. Her youngest son lives with her.  She brought her blood pressure logs with systolic blood pressures 242P to 160s, therefore we restarted losartan 25 mg at nighttime with goal blood pressure 140s to 150s.  She went to the ED on 06/17/22 with rectal bleeding.  ED note mentioned "Clinically I suspect a diverticular type bleed in the setting of her recent magnesium citrate use."  No gross blood.    Today, with her daughter.  They discussed recent ED visit.  Patient has chronic constipation.  She may have a small BM every 3 days.  She is starting to get back on MiraLAX regularly and admits to needing to increase her hydration.  She has scant rectal  bleeding occasionally.  She denies palpitations and chest pain.  She has some mild dyspnea after walking 200 yards from her mailbox and back.  She denies lightheadedness.  Ambulates with a cane.    Past Medical History:  Diagnosis Date   Diverticulitis    Esophageal stricture    GERD (gastroesophageal reflux disease)    Hyperlipidemia    Hypertension    Shingles    Stroke [facility])    Vitamin B 12 deficiency     Past Surgical History:  Procedure Laterality Date   ABDOMINAL HYSTERECTOMY     APPENDECTOMY     COLONOSCOPY  12-09-08   per Dr. Sharlett Iles (incomplete along with barium enema 12-10-08) with hemorrhoids  and diverticulae   COLONOSCOPY WITH PROPOFOL N/A 07/10/2019   Procedure: COLONOSCOPY WITH PROPOFOL;  Surgeon: Irving Copas., MD;  Location: [facility];  Service: Gastroenterology;  Laterality: N/A;   ESOPHAGOGASTRODUODENOSCOPY  09-2004   per Dr. Sharlett Iles with dilatation    EYE SURGERY     HEMOSTASIS CLIP PLACEMENT  07/10/2019   Procedure: HEMOSTASIS CLIP PLACEMENT;  Surgeon: Irving Copas., MD;  Location: [facility];  Service: Gastroenterology;;   POLYPECTOMY  07/10/2019   Procedure: POLYPECTOMY;  Surgeon: Irving Copas., MD;  Location: [facility] ENDOSCOPY;  Service: Gastroenterology;;   TONSILLECTOMY      Current Medications: Current Meds  Medication Sig   Acetaminophen (TYLENOL 8 HOUR PO) Take by mouth every 8 (eight) hours  as needed.   atenolol (TENORMIN) 50 MG tablet Take 1 tablet (50 mg total) by mouth daily.   docusate sodium (COLACE) 100 MG capsule Take 100 mg by mouth 2 (two) times daily.   mirtazapine (REMERON) 15 MG tablet Take 1/2 (7.5 mg) tablet by mouth at bedtime   pantoprazole (PROTONIX) 40 MG tablet Take 1 tablet (40 mg total) by mouth daily.   polyethylene glycol (MIRALAX / GLYCOLAX) 17 g packet Take 17 g by mouth daily. (Patient taking differently: Take 17 g by mouth as needed.)   Vitamin D, Ergocalciferol, (DRISDOL) 1.25 MG (50000  UNIT) CAPS capsule Take 1 capsule (50,000 Units total) by mouth every 7 (seven) days.   [DISCONTINUED] ELIQUIS 2.5 MG TABS tablet TAKE 1 TABLET BY MOUTH TWICE A DAY   [DISCONTINUED] losartan (COZAAR) 25 MG tablet TAKE 1 TABLET BY MOUTH DAILY IN THE EVENING     Allergies:   Amoxicillin, Codeine, Lipitor [atorvastatin], Lisinopril, Nitrofurantoin, and Tramadol   Social History   Socioeconomic History   Marital status: Widowed    Spouse name: Grayling Congress. [place]   Number of children: 4   Years of education: HS   Highest education level: 12th grade  Occupational History   Occupation: Retired    Fish farm manager: RETIRED  Tobacco Use   Smoking status: Never   Smokeless tobacco: Never  Vaping Use   Vaping Use: Never used  Substance and Sexual Activity   Alcohol use: No    Alcohol/week: 0.0 standard drinks of alcohol   Drug use: No   Sexual activity: Not on file  Other Topics Concern   Not on file  Social History Narrative   Patient lives at home with family.   Patient is right handed.   Patient has a high school education.   Caffeine Use: 1 cup daily   Social Determinants of Health   Financial Resource Strain: Low Risk  (02/16/2022)   Overall Financial Resource Strain (CARDIA)    Difficulty of Paying Living Expenses: Not hard at all  Food Insecurity: No Food Insecurity (02/16/2022)   Hunger Vital Sign    Worried About Running Out of Food in the Last Year: Never true    Ran Out of Food in the Last Year: Never true  Transportation Needs: No Transportation Needs (02/16/2022)   PRAPARE - Hydrologist (Medical): No    Lack of Transportation (Non-Medical): No  Physical Activity: Inactive (02/16/2022)   Exercise Vital Sign    Days of Exercise per Week: 0 days    Minutes of Exercise per Session: 0 min  Stress: No Stress Concern Present (02/16/2022)   [place]    Feeling of Stress : Not at all   Social Connections: Moderately Integrated (02/16/2022)   Social Connection and Isolation Panel [NHANES]    Frequency of Communication with Friends and Family: More than three times a week    Frequency of Social Gatherings with Friends and Family: More than three times a week    Attends Religious Services: More than 4 times per year    Active Member of [employer] or Organizations: Yes    Attends Archivist Meetings: More than 4 times per year    Marital Status: Widowed  Recent Concern: Social Connections - Moderately Isolated (02/06/2022)   Social Connection and Isolation Panel [NHANES]    Frequency of Communication with Friends and Family: More than three times a week    Frequency  of Social Gatherings with Friends and Family: Three times a week    Attends Religious Services: Never    Active Member of Clubs or Organizations: Yes    Attends Archivist Meetings: More than 4 times per year    Marital Status: Widowed     Family History: The patient's family history includes Breast cancer in an other family member; Cancer in her brother and brother; Diabetes in her brother and another family member; Heart attack in her mother; Heart disease in her father and another family member; Hypertension in an other family member; Stroke in her sister and another family member.  ROS:   Please see the history of present illness.     EKGs/Labs/Other Studies Reviewed:    Recent Labs: 11/09/2021: TSH 1.39 06/17/2022: ALT 17; BUN 10; Creatinine, Ser 1.13; Hemoglobin 12.8; Platelets 238; Potassium 4.1; Sodium 140   Recent Lipid Panel Lab Results  Component Value Date/Time   CHOL 254 (H) 11/09/2021 10:39 AM   TRIG 117.0 11/09/2021 10:39 AM   TRIG 92 05/28/2006 11:04 AM   HDL 86.30 11/09/2021 10:39 AM   LDLCALC 145 (H) 11/09/2021 10:39 AM   LDLDIRECT 113.3 07/18/2012 11:13 AM    Physical Exam:    VS:  BP 138/60 (BP Location: Left Arm, Patient Position: Sitting, Cuff Size: Normal)    Pulse 78   Ht '5\' 2"'$  (1.575 m)   Wt 140 lb (63.5 kg)   BMI 25.61 kg/m    No data found.       Wt Readings from Last 3 Encounters:  06/28/22 140 lb (63.5 kg)  05/09/22 137 lb 2 oz (62.2 kg)  02/16/22 136 lb (61.7 kg)     GEN:  Well nourished, well developed in no acute distress HEENT: Normal NECK: No JVD; No carotid bruits CARDIAC: irreg irreg RESPIRATORY:  Clear to auscultation without rales, wheezing or rhonchi  ABDOMEN: Soft, non-tender, non-distended MUSCULOSKELETAL: No edema SKIN: Warm and dry NEUROLOGIC:  Alert and oriented PSYCHIATRIC:  Normal affect     ASSESSMENT AND PLAN   Permanent atrial fibrillation, rate controlled History of CVA (2/2 A-fib) -Continue atenolol for rate control and Eliquis for stroke prevention.   -Continue lower dose Eliquis (given age & hemorrhoid bleeding hx).  I still believe the benefit outweighs the risk of Eliquis at this point.  Last Hgb 12.8.  We discussed if significant rectal bleeding especially if associated lightheadedness, weakness and fatigue - she needs urgent evaluation.  Otherwise, fiber and hydration to help constipation.   Hypertension, reasonably controlled -Systolic blood pressure runs 120s to 140s on average. -Continue atenolol and losartan.   Disposition - Follow-up in 1 year.      Medication Adjustments/Labs and Tests Ordered: Current medicines are reviewed at length with the patient today.  Concerns regarding medicines are outlined above.  No orders of the defined types were placed in this encounter.  Meds ordered this encounter  Medications   apixaban (ELIQUIS) 2.5 MG TABS tablet    Sig: Take 1 tablet (2.5 mg total) by mouth 2 (two) times daily.    Dispense:  180 tablet    Refill:  1   losartan (COZAAR) 25 MG tablet    Sig: Take 1 tablet (25 mg total) by mouth every evening.    Dispense:  90 tablet    Refill:  3    Patient Instructions  Medication Instructions:  No Changes *If you need a refill on your  cardiac medications before your next appointment, please  call your pharmacy*   Lab Work: No Labs If you have labs (blood work) drawn today and your tests are completely normal, you will receive your results only by: [place] (if you have MyChart) OR A paper copy in the mail If you have any lab test that is abnormal or we need to change your treatment, we will call you to review the results.   Testing/Procedures: No Testing    Follow-Up: At [facility], you and your health needs are our priority.  As part of our continuing mission to provide you with exceptional heart care, we have created designated Provider Care Teams.  These Care Teams include your primary Cardiologist (physician) and Advanced Practice Providers (APPs -  Physician Assistants and Nurse Practitioners) who all work together to provide you with the care you need, when you need it.  We recommend signing up for the patient portal called "MyChart".  Sign up information is provided on this After Visit Summary.  MyChart is used to connect with patients for Virtual Visits (Telemedicine).  Patients are able to view lab/test results, encounter notes, upcoming appointments, etc.  Non-urgent messages can be sent to your provider as well.   To learn more about what you can do with MyChart, go to [email or web address].    Your next appointment:   1 year(s)  The format for your next appointment:   In Person  Provider:   Peter Martinique, MD     Signed, Warren Lacy, PA-C  06/28/2022 10:58 AM    [place]

## 2022-06-27 NOTE — Telephone Encounter (Signed)
Prescription refill request for Eliquis received. Indication:afib Last office visit:11/22 Scr:1.1 Age: 88 Weight:62.2  kg  Prescription refilled

## 2022-06-28 ENCOUNTER — Encounter: Payer: Self-pay | Admitting: Physician Assistant

## 2022-06-28 ENCOUNTER — Ambulatory Visit: Payer: Medicare Other | Attending: Physician Assistant | Admitting: Physician Assistant

## 2022-06-28 VITALS — BP 138/60 | HR 78 | Ht 62.0 in | Wt 140.0 lb

## 2022-06-28 DIAGNOSIS — I480 Paroxysmal atrial fibrillation: Secondary | ICD-10-CM

## 2022-06-28 DIAGNOSIS — I10 Essential (primary) hypertension: Secondary | ICD-10-CM

## 2022-06-28 MED ORDER — LOSARTAN POTASSIUM 25 MG PO TABS: 25.0000 mg | ORAL_TABLET | Freq: Every evening | ORAL | 3 refills | Status: DC

## 2022-06-28 MED ORDER — APIXABAN 2.5 MG PO TABS: 2.5000 mg | ORAL_TABLET | Freq: Two times a day (BID) | ORAL | 1 refills | Status: DC

## 2022-06-28 NOTE — Patient Instructions (Signed)
Medication Instructions:  No Changes *If you need a refill on your cardiac medications before your next appointment, please call your pharmacy*   Lab Work: No Labs If you have labs (blood work) drawn today and your tests are completely normal, you will receive your results only by: [place] (if you have MyChart) OR A paper copy in the mail If you have any lab test that is abnormal or we need to change your treatment, we will call you to review the results.   Testing/Procedures: No Testing    Follow-Up: At [facility], you and your health needs are our priority.  As part of our continuing mission to provide you with exceptional heart care, we have created designated Provider Care Teams.  These Care Teams include your primary Cardiologist (physician) and Advanced Practice Providers (APPs -  Physician Assistants and Nurse Practitioners) who all work together to provide you with the care you need, when you need it.  We recommend signing up for the patient portal called "MyChart".  Sign up information is provided on this After Visit Summary.  MyChart is used to connect with patients for Virtual Visits (Telemedicine).  Patients are able to view lab/test results, encounter notes, upcoming appointments, etc.  Non-urgent messages can be sent to your provider as well.   To learn more about what you can do with MyChart, go to [email or web address].    Your next appointment:   1 year(s)  The format for your next appointment:   In Person  Provider:   Peter Martinique, MD

## 2022-06-29 ENCOUNTER — Encounter: Payer: Self-pay | Admitting: *Deleted

## 2022-06-29 ENCOUNTER — Telehealth: Payer: Self-pay | Admitting: *Deleted

## 2022-06-29 NOTE — Patient Instructions (Signed)
Visit Information  Thank you for taking time to visit with me today. Please don't hesitate to contact me if I can be of assistance to you.   Following are the goals we discussed today:   Goals Addressed             This Visit's Progress    COMPLETED: constipation       Care Coordination Interventions: Reviewed medications with patient and discussed adherence with medication with no needed refills Reviewed scheduled/upcoming provider appointments including sufficient transportation sources Assessed social determinant of health barriers Education provided on prevention measures for constipation and dehydration          Please call the care guide team at [phone] if you need to cancel or reschedule your appointment.   If you are experiencing a Mental Health or [place] or need someone to talk to, please call the Suicide and Crisis Lifeline: 988  Patient verbalizes understanding of instructions and care plan provided today and agrees to view in [place]. Active MyChart status and patient understanding of how to access instructions and care plan via MyChart confirmed with patient.     The patient has been provided with contact information for the care management team and has been advised to call with any health related questions or concerns.  No further follow up required: No further follow up needs  Raina Mina, RN Care Management Coordinator [place] Office [phone]

## 2022-06-29 NOTE — Patient Outreach (Signed)
  Care Coordination   Initial Visit Note   06/29/2022 Name: Christine Middleton MRN: [id number] DOB: [birth year]  Christine Middleton is a 88 y.o. year old female who sees Christine Morale, MD for primary care. I  spoke with daughter Christine Middleton(pt HOH).  What matters to the patients health and wellness today?  Constipation    Goals Addressed             This Visit's Progress    COMPLETED: constipation       Care Coordination Interventions: Reviewed medications with patient and discussed adherence with medication with no needed refills Reviewed scheduled/upcoming provider appointments including sufficient transportation sources Assessed social determinant of health barriers Education provided on prevention measures for constipation and dehydration          SDOH assessments and interventions completed:  Yes  SDOH Interventions Today    Flowsheet Row Most Recent Value  SDOH Interventions   Food Insecurity Interventions Intervention Not Indicated  Housing Interventions Intervention Not Indicated  Transportation Interventions Intervention Not Indicated  Utilities Interventions Intervention Not Indicated        Care Coordination Interventions:  Yes, provided   Follow up plan: No further intervention required.   Encounter Outcome:  Pt. Visit Completed   Christine Mina, RN Care Management Coordinator [place] Office [phone]

## 2022-07-06 DIAGNOSIS — H34811 Central retinal vein occlusion, right eye, with macular edema: Secondary | ICD-10-CM | POA: Diagnosis not present

## 2022-08-02 DIAGNOSIS — K080 Exfoliation of teeth due to systemic causes: Secondary | ICD-10-CM | POA: Diagnosis not present

## 2022-08-08 ENCOUNTER — Other Ambulatory Visit: Payer: Self-pay | Admitting: Family Medicine

## 2022-08-28 ENCOUNTER — Encounter: Payer: Self-pay | Admitting: Nurse Practitioner

## 2022-08-29 ENCOUNTER — Ambulatory Visit (INDEPENDENT_AMBULATORY_CARE_PROVIDER_SITE_OTHER): Payer: Medicare Other | Admitting: Family Medicine

## 2022-08-29 ENCOUNTER — Encounter: Payer: Self-pay | Admitting: Family Medicine

## 2022-08-29 VITALS — BP 124/70 | HR 86 | Temp 97.6°F | Wt 136.0 lb

## 2022-08-29 DIAGNOSIS — K625 Hemorrhage of anus and rectum: Secondary | ICD-10-CM | POA: Diagnosis not present

## 2022-08-29 NOTE — Progress Notes (Signed)
++++++++    Subjective:    Patient ID: Christine Middleton, female    DOB: 01-27-1923, 89 y.o.   MRN: [id number]  HPI Here with her daughter to follow up on rectal bleeding. She was in the ED on 07-09-22 for this. She denies any abdominal pain or rectal pain. She is taking Colace twice every day but she only takes Miralax sporadically. At the ED her HGB was stable at  12.8. A CT scan was unremarkable. It was felt the bleeding was coming from either hemorrhoids or diverticula. Since then she has done well excpt for a couple instances of seeing some blood in the toilet after a BM. She is taking Eliquis as prescribed.    Review of Systems  Constitutional: Negative.   Respiratory: Negative.    Cardiovascular: Negative.   Gastrointestinal:  Positive for blood in stool and constipation. Negative for abdominal distention, abdominal pain, diarrhea, nausea, rectal pain and vomiting.       Objective:   Physical Exam Constitutional:      Appearance: Normal appearance. She is not ill-appearing.     Comments: Walks with a cane   Cardiovascular:     Rate and Rhythm: Normal rate and regular rhythm.     Pulses: Normal pulses.     Heart sounds: Normal heart sounds.  Pulmonary:     Effort: Pulmonary effort is normal.     Breath sounds: Normal breath sounds.  Abdominal:     General: Abdomen is flat. Bowel sounds are normal. There is no distension.     Palpations: Abdomen is soft. There is no mass.     Tenderness: There is no abdominal tenderness. There is no guarding or rebound.     Hernia: No hernia is present.  Neurological:     Mental Status: She is alert.           Assessment & Plan:  She has some minimal rectal bleeding at times, and I reminded her that this is common for folks who take blood thinners like Eliquis. I asked her to take Colace and Miralax every day. We will see her back in 2 months to check another Hgb. We spent a total of ( 31  ) minutes reviewing records and discussing these  issues.   [name], MD

## 2022-09-04 DIAGNOSIS — H34811 Central retinal vein occlusion, right eye, with macular edema: Secondary | ICD-10-CM | POA: Diagnosis not present

## 2022-09-19 ENCOUNTER — Telehealth: Payer: Self-pay

## 2022-09-19 NOTE — Progress Notes (Signed)
Care Management & Coordination Services Pharmacy Team  Reason for Encounter: Hypertension  Contacted patient to discuss hypertension disease state. Spoke with patient on 09/19/2022   Current antihypertensive regimen:  Atenolol 50 mg daily Losartan 25 mg daily Patient verbally confirms she is taking the above medications as directed. Yes  How often are you checking your Blood Pressure? Patient states she is checking 1-2 times per month. It is always good and she doesn't feel she needs to check it more often.   she checks her blood pressure at various times  Current home BP readings:  DATE:             BP               PULSE 09/18/22 132/68  - 09/04/22 140/74  -  (aprox date, pt unsure of actual date)  Wrist or arm cuff: Patient uses a wrist cuff  OTC medications including pseudoephedrine or NSAIDs? Patient denies  Any readings above 180/100? Patient denies  What recent interventions/DTPs have been made by any provider to improve Blood Pressure control since last CPP Visit: No recent interventions noted.   Any recent hospitalizations or ED visits since last visit with CPP?  Patient was seen at [facility] ED on 06/17/2022 (7 hours) due to rectal bleeding and abdominal discomfort.  Patient was seen at [facility] ED on 06/03/2022 (2 hours) due to Nonintractable headache, unspecified chronicity pattern, unspecified headache type .  Patient was seen at [facility] ED on 05/03/2022 (7 hours) due to Fall, initial encounter, closed fracture or nasal bone and contusion of rib on right side.   What diet changes have been made to improve Blood Pressure Control?  Patient follows low sodium Breakfast - patient will have cheerios and grapefruit Lunch - patient will have her main meal for lunch which includes a meat and vegetable Dinner - patient will have something light if she is [place] Caffeine intake - patient drinks 1/2 cup daily Salt intake - patient does not  add salt  What exercise is being done to improve your Blood Pressure Control?  Patient will walk to her mailbox everyday with her cane. She states she does walk around her house during the day and will do all her own housework and laundry.   Adherence Review: Is the patient currently on ACE/ARB medication? Yes Does the patient have >5 day gap between last estimated fill dates? No  Care Gaps: AWV - completed 02/16/2022,  scheduled 02/21/2023 Shingrix - never done Covid - overdue Dexa scan - postponed   Star Rating Drug: Losartan 25 mg - last filled 08/18/2022 90 DS at [facility]   Chart Updates: Recent office visits:  08/29/2022 [name] MD - Patient was seen for rectal bleeding, no medication changes.   05/09/2022 [name] MD - Patient was seen for Closed fracture of nasal bone with routine healing, subsequent encounter and additional concerns. No medication changes.   Recent consult visits:  06/28/2022 Caron Presume PA-C - Patient was seen for Paroxysmal atrial fibrillation and essential hypertension. No medication changes.  Hospital visits:  Patient was seen at [facility] ED on 06/17/2022 (7 hours) due to rectal bleeding and abdominal discomfort.    New?Medications Started at [facility] Discharge:?? No medication started Medication Changes at Hospital Discharge: No medication changes Medications Discontinued at Hospital Discharge: No medications discontinued Medications that remain the same after Hospital Discharge:??  -All other medications will remain the same.    Patient was  seen at [facility] ED on 06/03/2022 (2 hours) due to Nonintractable headache, unspecified chronicity pattern, unspecified headache type .  New?Medications Started at [facility] Discharge:?? No medication started Medication Changes at Hospital Discharge: No medication changes Medications Discontinued at Hospital Discharge: No medications discontinued Medications that remain the  same after Hospital Discharge:??  -All other medications will remain the same.  Patient was seen at [facility] ED on 05/03/2022 (7 hours) due to Fall, initial encounter, closed fracture or nasal bone and contusion of rib on right side.    New?Medications Started at [facility] Discharge:?? No medication started Medication Changes at Hospital Discharge: No medication changes Medications Discontinued at Hospital Discharge: No medications discontinued Medications that remain the same after Hospital Discharge:??  -All other medications will remain the same.  Medications: Outpatient Encounter Medications as of 09/19/2022  Medication Sig   Acetaminophen (TYLENOL 8 HOUR PO) Take by mouth every 8 (eight) hours as needed.   apixaban (ELIQUIS) 2.5 MG TABS tablet Take 1 tablet (2.5 mg total) by mouth 2 (two) times daily.   atenolol (TENORMIN) 50 MG tablet TAKE 1 TABLET BY MOUTH EVERY DAY   docusate sodium (COLACE) 100 MG capsule Take 100 mg by mouth 2 (two) times daily.   losartan (COZAAR) 25 MG tablet Take 1 tablet (25 mg total) by mouth every evening.   mirtazapine (REMERON) 15 MG tablet Take 1/2 (7.5 mg) tablet by mouth at bedtime   pantoprazole (PROTONIX) 40 MG tablet Take 1 tablet (40 mg total) by mouth daily.   polyethylene glycol (MIRALAX / GLYCOLAX) 17 g packet Take 17 g by mouth daily. (Patient taking differently: Take 17 g by mouth as needed.)   Vitamin D, Ergocalciferol, (DRISDOL) 1.25 MG (50000 UNIT) CAPS capsule Take 1 capsule (50,000 Units total) by mouth every 7 (seven) days.   No facility-administered encounter medications on file as of 09/19/2022.  Fill History:   Dispensed Days Supply Quantity Provider Pharmacy  ELIQUIS 2.5 MG TABLET 06/27/2022 90 180 each      Dispensed Days Supply Quantity Provider Pharmacy  ATENOLOL 50 MG TABLET 08/08/2022 90 90 each      Dispensed Days Supply Quantity Provider Pharmacy  LOSARTAN POTASSIUM 25 MG TAB 08/18/2022 90 90 each       Dispensed Days Supply Quantity Provider Pharmacy  MIRTAZAPINE 15 MG TABLET 08/20/2022 90 45 each      Dispensed Days Supply Quantity Provider Pharmacy  PANTOPRAZOLE SOD DR 40 MG TAB 08/09/2022 90 90 each     Recent Office Vitals: BP Readings from Last 3 Encounters:  08/29/22 124/70  06/28/22 138/60  06/17/22 (!) 199/88   Pulse Readings from Last 3 Encounters:  08/29/22 86  06/28/22 78  06/17/22 69    Wt Readings from Last 3 Encounters:  08/29/22 136 lb (61.7 kg)  06/28/22 140 lb (63.5 kg)  05/09/22 137 lb 2 oz (62.2 kg)     Kidney Function Lab Results  Component Value Date/Time   CREATININE 1.13 (H) 06/17/2022 02:24 PM   CREATININE 1.16 (H) 06/03/2022 07:35 PM   CREATININE 0.81 05/02/2021 11:27 AM   CREATININE 0.83 11/26/2020 12:36 PM   GFR 50.94 (L) 11/09/2021 10:39 AM   GFRNONAA 44 (L) 06/17/2022 02:24 PM   GFRNONAA >60 05/02/2021 11:27 AM   GFRAA >60 07/11/2019 06:36 AM       Latest Ref Rng & Units 06/17/2022    2:24 PM 06/03/2022    7:35 PM 05/03/2022    2:35 PM  BMP  Glucose 70 - 99 mg/dL 128  127  110   BUN 8 - 23 mg/dL 10  13  9   $ Creatinine 0.44 - 1.00 mg/dL 1.13  1.16  1.08   Sodium 135 - 145 mmol/L 140  142  139   Potassium 3.5 - 5.1 mmol/L 4.1  3.8  3.8   Chloride 98 - 111 mmol/L 101  108  101   CO2 22 - 32 mmol/L 26  25  27   $ Calcium 8.9 - 10.3 mg/dL 9.3  8.7  9.4    [place] Pharmacist Assistant [phone]

## 2022-09-21 ENCOUNTER — Other Ambulatory Visit: Payer: Self-pay | Admitting: Family Medicine

## 2022-09-27 ENCOUNTER — Other Ambulatory Visit: Payer: Self-pay | Admitting: Family Medicine

## 2022-11-03 ENCOUNTER — Other Ambulatory Visit: Payer: Self-pay | Admitting: Family Medicine

## 2022-11-14 ENCOUNTER — Encounter: Payer: Self-pay | Admitting: Family Medicine

## 2022-11-14 ENCOUNTER — Ambulatory Visit (INDEPENDENT_AMBULATORY_CARE_PROVIDER_SITE_OTHER): Payer: Medicare Other | Admitting: Family Medicine

## 2022-11-14 VITALS — BP 120/78 | HR 73 | Temp 98.0°F | Ht 62.0 in | Wt 131.4 lb

## 2022-11-14 DIAGNOSIS — I10 Essential (primary) hypertension: Secondary | ICD-10-CM | POA: Diagnosis not present

## 2022-11-14 DIAGNOSIS — I480 Paroxysmal atrial fibrillation: Secondary | ICD-10-CM | POA: Diagnosis not present

## 2022-11-14 DIAGNOSIS — K625 Hemorrhage of anus and rectum: Secondary | ICD-10-CM | POA: Diagnosis not present

## 2022-11-14 DIAGNOSIS — K219 Gastro-esophageal reflux disease without esophagitis: Secondary | ICD-10-CM

## 2022-11-14 DIAGNOSIS — E559 Vitamin D deficiency, unspecified: Secondary | ICD-10-CM | POA: Diagnosis not present

## 2022-11-14 DIAGNOSIS — F418 Other specified anxiety disorders: Secondary | ICD-10-CM

## 2022-11-14 DIAGNOSIS — E538 Deficiency of other specified B group vitamins: Secondary | ICD-10-CM | POA: Diagnosis not present

## 2022-11-14 DIAGNOSIS — I6310 Cerebral infarction due to embolism of unspecified precerebral artery: Secondary | ICD-10-CM

## 2022-11-14 DIAGNOSIS — D500 Iron deficiency anemia secondary to blood loss (chronic): Secondary | ICD-10-CM | POA: Diagnosis not present

## 2022-11-14 DIAGNOSIS — R600 Localized edema: Secondary | ICD-10-CM

## 2022-11-14 DIAGNOSIS — K5900 Constipation, unspecified: Secondary | ICD-10-CM

## 2022-11-14 DIAGNOSIS — G609 Hereditary and idiopathic neuropathy, unspecified: Secondary | ICD-10-CM

## 2022-11-14 DIAGNOSIS — M353 Polymyalgia rheumatica: Secondary | ICD-10-CM | POA: Diagnosis not present

## 2022-11-14 DIAGNOSIS — R7309 Other abnormal glucose: Secondary | ICD-10-CM

## 2022-11-14 DIAGNOSIS — I639 Cerebral infarction, unspecified: Secondary | ICD-10-CM

## 2022-11-14 DIAGNOSIS — E782 Mixed hyperlipidemia: Secondary | ICD-10-CM

## 2022-11-14 LAB — LIPID PANEL
Cholesterol: 218 mg/dL — ABNORMAL HIGH (ref 0–200)
HDL: 77.7 mg/dL (ref >39.00)
LDL Cholesterol: 117 mg/dL — ABNORMAL HIGH (ref 0–99)
NonHDL: 140.21
Total CHOL/HDL Ratio: 3
Triglycerides: 118 mg/dL (ref 0.0–149.0)
VLDL: 23.6 mg/dL (ref 0.0–40.0)

## 2022-11-14 LAB — CBC WITH DIFFERENTIAL/PLATELET
Basophils Absolute: 0.1 10*3/uL (ref 0.0–0.1)
Basophils Relative: 1.4 % (ref 0.0–3.0)
Eosinophils Absolute: 0.5 10*3/uL (ref 0.0–0.7)
Eosinophils Relative: 7.8 % — ABNORMAL HIGH (ref 0.0–5.0)
HCT: 36.5 % (ref 36.0–46.0)
Hemoglobin: 12.1 g/dL (ref 12.0–15.0)
Lymphocytes Relative: 20.1 % (ref 12.0–46.0)
Lymphs Abs: 1.4 10*3/uL (ref 0.7–4.0)
MCHC: 33.3 g/dL (ref 30.0–36.0)
MCV: 89 fl (ref 78.0–100.0)
Monocytes Absolute: 0.9 10*3/uL (ref 0.1–1.0)
Monocytes Relative: 12.5 % — ABNORMAL HIGH (ref 3.0–12.0)
Neutro Abs: 4 10*3/uL (ref 1.4–7.7)
Neutrophils Relative %: 58.2 % (ref 43.0–77.0)
Platelets: 208 10*3/uL (ref 150.0–400.0)
RBC: 4.1 Mil/uL (ref 3.87–5.11)
RDW: 13.7 % (ref 11.5–15.5)
WBC: 6.9 10*3/uL (ref 4.0–10.5)

## 2022-11-14 LAB — HEPATIC FUNCTION PANEL
ALT: 10 U/L (ref 0–35)
AST: 18 U/L (ref 0–37)
Albumin: 4 g/dL (ref 3.5–5.2)
Alkaline Phosphatase: 90 U/L (ref 39–117)
Bilirubin, Direct: 0.2 mg/dL (ref 0.0–0.3)
Total Bilirubin: 0.9 mg/dL (ref 0.2–1.2)
Total Protein: 7.2 g/dL (ref 6.0–8.3)

## 2022-11-14 LAB — BASIC METABOLIC PANEL
BUN: 10 mg/dL (ref 6–23)
CO2: 25 mEq/L (ref 19–32)
Calcium: 9.5 mg/dL (ref 8.4–10.5)
Chloride: 103 mEq/L (ref 96–112)
Creatinine, Ser: 1.04 mg/dL (ref 0.40–1.20)
GFR: 44.8 mL/min — ABNORMAL LOW (ref >60.00)
Glucose, Bld: 105 mg/dL — ABNORMAL HIGH (ref 70–99)
Potassium: 4.4 mEq/L (ref 3.5–5.1)
Sodium: 138 mEq/L (ref 135–145)

## 2022-11-14 LAB — HEMOGLOBIN A1C: Hgb A1c MFr Bld: 5.9 % (ref 4.6–6.5)

## 2022-11-14 LAB — VITAMIN B12: Vitamin B-12: 261 pg/mL (ref 211–911)

## 2022-11-14 LAB — VITAMIN D 25 HYDROXY (VIT D DEFICIENCY, FRACTURES): VITD: 66.79 ng/mL (ref 30.00–100.00)

## 2022-11-14 LAB — TSH: TSH: 1.13 u[IU]/mL (ref 0.35–5.50)

## 2022-11-14 MED ORDER — MIRTAZAPINE 15 MG PO TABS: ORAL_TABLET | ORAL | 3 refills | Status: DC

## 2022-11-14 MED ORDER — LOSARTAN POTASSIUM 25 MG PO TABS: 25.0000 mg | ORAL_TABLET | Freq: Every evening | ORAL | 3 refills | Status: DC

## 2022-11-14 MED ORDER — APIXABAN 2.5 MG PO TABS: 2.5000 mg | ORAL_TABLET | Freq: Two times a day (BID) | ORAL | 3 refills | Status: DC

## 2022-11-14 NOTE — Progress Notes (Signed)
Subjective:    Patient ID: Christine Middleton, female    DOB: 08/28/1922, 89 y.o.   MRN: [id number]  HPI Here with her daughter to follow up on issues. She feels well and has no concerns. She still has some occasional light bleeding with a BM, but this has improved since she started using Miralax daily. Her BP is stable. Her GERD and PMR are stable. She sleeps well. Her appetite is good thanks to the Mirtazapine. Her depression and anxiety are well controlled.    Review of Systems  Constitutional: Negative.   HENT: Negative.    Eyes: Negative.   Respiratory: Negative.    Cardiovascular: Negative.   Gastrointestinal:  Positive for anal bleeding.  Genitourinary:  Negative for decreased urine volume, difficulty urinating, dyspareunia, dysuria, enuresis, flank pain, frequency, hematuria, pelvic pain and urgency.  Musculoskeletal: Negative.   Skin: Negative.   Neurological: Negative.  Negative for headaches.  Psychiatric/Behavioral: Negative.         Objective:   Physical Exam Constitutional:      General: She is not in acute distress.    Appearance: Normal appearance. She is well-developed.     Comments: Walks with a cane   HENT:     Head: Normocephalic and atraumatic.     Right Ear: External ear normal.     Left Ear: External ear normal.     Nose: Nose normal.     Mouth/Throat:     Pharynx: No oropharyngeal exudate.  Eyes:     General: No scleral icterus.    Conjunctiva/sclera: Conjunctivae normal.     Pupils: Pupils are equal, round, and reactive to light.  Neck:     Thyroid: No thyromegaly.     Vascular: No JVD.  Cardiovascular:     Rate and Rhythm: Normal rate and regular rhythm.     Pulses: Normal pulses.     Heart sounds: Normal heart sounds. No murmur heard.    No friction rub. No gallop.  Pulmonary:     Effort: Pulmonary effort is normal. No respiratory distress.     Breath sounds: Normal breath sounds. No wheezing or rales.  Chest:     Chest wall: No tenderness.   Abdominal:     General: Bowel sounds are normal. There is no distension.     Palpations: Abdomen is soft. There is no mass.     Tenderness: There is no abdominal tenderness. There is no guarding or rebound.  Musculoskeletal:        General: No tenderness. Normal range of motion.     Cervical back: Normal range of motion and neck supple.  Lymphadenopathy:     Cervical: No cervical adenopathy.  Skin:    General: Skin is warm and dry.     Findings: No erythema or rash.  Neurological:     Mental Status: She is alert and oriented to person, place, and time.     Cranial Nerves: No cranial nerve deficit.     Motor: No abnormal muscle tone.     Coordination: Coordination normal.     Deep Tendon Reflexes: Reflexes are normal and symmetric. Reflexes normal.  Psychiatric:        Mood and Affect: Mood normal.        Behavior: Behavior normal.        Thought Content: Thought content normal.        Judgment: Judgment normal.           Assessment & Plan:  She  is doing well as far as HTN and atrial fibrillation go. Her heart rates are stable. She remains on Eliquis. Her depression and anxiety are stable. Her rectal bleeding is minimal now. She has few symptoms of PMR lately. Her GERD is stable. We will get fasting labs to check lipids, an A1c, etc. We spent a total of ( 35  ) minutes reviewing records and discussing these issues.  Gershon Crane, MD

## 2022-11-15 DIAGNOSIS — K080 Exfoliation of teeth due to systemic causes: Secondary | ICD-10-CM | POA: Diagnosis not present

## 2022-12-19 ENCOUNTER — Emergency Department (HOSPITAL_COMMUNITY): Payer: Medicare Other

## 2022-12-19 ENCOUNTER — Inpatient Hospital Stay (HOSPITAL_COMMUNITY)
Admission: EM | Admit: 2022-12-19 | Discharge: 2022-12-23 | DRG: 394 | Disposition: A | Discharge status: Home / Self Care | Payer: Medicare Other | Attending: Internal Medicine | Admitting: Internal Medicine

## 2022-12-19 ENCOUNTER — Other Ambulatory Visit: Payer: Self-pay

## 2022-12-19 DIAGNOSIS — T45515A Adverse effect of anticoagulants, initial encounter: Secondary | ICD-10-CM | POA: Diagnosis not present

## 2022-12-19 DIAGNOSIS — K219 Gastro-esophageal reflux disease without esophagitis: Secondary | ICD-10-CM | POA: Diagnosis not present

## 2022-12-19 DIAGNOSIS — K648 Other hemorrhoids: Principal | ICD-10-CM

## 2022-12-19 DIAGNOSIS — K922 Gastrointestinal hemorrhage, unspecified: Secondary | ICD-10-CM | POA: Diagnosis not present

## 2022-12-19 DIAGNOSIS — Z8673 Personal history of transient ischemic attack (TIA), and cerebral infarction without residual deficits: Secondary | ICD-10-CM

## 2022-12-19 DIAGNOSIS — I4891 Unspecified atrial fibrillation: Secondary | ICD-10-CM | POA: Diagnosis not present

## 2022-12-19 DIAGNOSIS — Z8619 Personal history of other infectious and parasitic diseases: Secondary | ICD-10-CM

## 2022-12-19 DIAGNOSIS — K644 Residual hemorrhoidal skin tags: Secondary | ICD-10-CM | POA: Diagnosis present

## 2022-12-19 DIAGNOSIS — Z9049 Acquired absence of other specified parts of digestive tract: Secondary | ICD-10-CM

## 2022-12-19 DIAGNOSIS — K5730 Diverticulosis of large intestine without perforation or abscess without bleeding: Secondary | ICD-10-CM | POA: Diagnosis present

## 2022-12-19 DIAGNOSIS — F32A Depression, unspecified: Secondary | ICD-10-CM | POA: Diagnosis not present

## 2022-12-19 DIAGNOSIS — E8809 Other disorders of plasma-protein metabolism, not elsewhere classified: Secondary | ICD-10-CM | POA: Diagnosis not present

## 2022-12-19 DIAGNOSIS — I130 Hypertensive heart and chronic kidney disease with heart failure and stage 1 through stage 4 chronic kidney disease, or unspecified chronic kidney disease: Secondary | ICD-10-CM | POA: Diagnosis present

## 2022-12-19 DIAGNOSIS — Z803 Family history of malignant neoplasm of breast: Secondary | ICD-10-CM | POA: Diagnosis not present

## 2022-12-19 DIAGNOSIS — K625 Hemorrhage of anus and rectum: Secondary | ICD-10-CM | POA: Diagnosis not present

## 2022-12-19 DIAGNOSIS — Z823 Family history of stroke: Secondary | ICD-10-CM

## 2022-12-19 DIAGNOSIS — D649 Anemia, unspecified: Secondary | ICD-10-CM | POA: Diagnosis not present

## 2022-12-19 DIAGNOSIS — Z79899 Other long term (current) drug therapy: Secondary | ICD-10-CM

## 2022-12-19 DIAGNOSIS — K5790 Diverticulosis of intestine, part unspecified, without perforation or abscess without bleeding: Secondary | ICD-10-CM | POA: Diagnosis not present

## 2022-12-19 DIAGNOSIS — K5909 Other constipation: Secondary | ICD-10-CM | POA: Diagnosis present

## 2022-12-19 DIAGNOSIS — Z90710 Acquired absence of both cervix and uterus: Secondary | ICD-10-CM | POA: Diagnosis not present

## 2022-12-19 DIAGNOSIS — I480 Paroxysmal atrial fibrillation: Secondary | ICD-10-CM | POA: Diagnosis present

## 2022-12-19 DIAGNOSIS — Z833 Family history of diabetes mellitus: Secondary | ICD-10-CM

## 2022-12-19 DIAGNOSIS — E441 Mild protein-calorie malnutrition: Secondary | ICD-10-CM | POA: Diagnosis not present

## 2022-12-19 DIAGNOSIS — E785 Hyperlipidemia, unspecified: Secondary | ICD-10-CM | POA: Diagnosis not present

## 2022-12-19 DIAGNOSIS — Z8249 Family history of ischemic heart disease and other diseases of the circulatory system: Secondary | ICD-10-CM

## 2022-12-19 DIAGNOSIS — D62 Acute posthemorrhagic anemia: Secondary | ICD-10-CM | POA: Diagnosis present

## 2022-12-19 DIAGNOSIS — Z885 Allergy status to narcotic agent status: Secondary | ICD-10-CM

## 2022-12-19 DIAGNOSIS — Z6824 Body mass index (BMI) 24.0-24.9, adult: Secondary | ICD-10-CM

## 2022-12-19 DIAGNOSIS — I5032 Chronic diastolic (congestive) heart failure: Secondary | ICD-10-CM | POA: Diagnosis not present

## 2022-12-19 DIAGNOSIS — D6832 Hemorrhagic disorder due to extrinsic circulating anticoagulants: Secondary | ICD-10-CM | POA: Diagnosis present

## 2022-12-19 DIAGNOSIS — Z888 Allergy status to other drugs, medicaments and biological substances status: Secondary | ICD-10-CM

## 2022-12-19 DIAGNOSIS — N1832 Chronic kidney disease, stage 3b: Secondary | ICD-10-CM | POA: Diagnosis present

## 2022-12-19 DIAGNOSIS — Z7901 Long term (current) use of anticoagulants: Secondary | ICD-10-CM | POA: Diagnosis not present

## 2022-12-19 DIAGNOSIS — F419 Anxiety disorder, unspecified: Secondary | ICD-10-CM | POA: Diagnosis present

## 2022-12-19 DIAGNOSIS — Z880 Allergy status to penicillin: Secondary | ICD-10-CM

## 2022-12-19 LAB — BPAM RBC: Unit Type and Rh: 5100

## 2022-12-19 LAB — CBC WITH DIFFERENTIAL/PLATELET
Abs Immature Granulocytes: 0.01 10*3/uL (ref 0.00–0.07)
Basophils Absolute: 0.1 10*3/uL (ref 0.0–0.1)
Basophils Relative: 1 %
Eosinophils Absolute: 0.3 10*3/uL (ref 0.0–0.5)
Eosinophils Relative: 5 %
HCT: 27.3 % — ABNORMAL LOW (ref 36.0–46.0)
Hemoglobin: 8.7 g/dL — ABNORMAL LOW (ref 12.0–15.0)
Immature Granulocytes: 0 %
Lymphocytes Relative: 12 %
Lymphs Abs: 0.8 10*3/uL (ref 0.7–4.0)
MCH: 28.9 pg (ref 26.0–34.0)
MCHC: 31.9 g/dL (ref 30.0–36.0)
MCV: 90.7 fL (ref 80.0–100.0)
Monocytes Absolute: 0.7 10*3/uL (ref 0.1–1.0)
Monocytes Relative: 11 %
Neutro Abs: 4.5 10*3/uL (ref 1.7–7.7)
Neutrophils Relative %: 71 %
Platelets: 233 10*3/uL (ref 150–400)
RBC: 3.01 MIL/uL — ABNORMAL LOW (ref 3.87–5.11)
RDW: 13.5 % (ref 11.5–15.5)
WBC: 6.5 10*3/uL (ref 4.0–10.5)
nRBC: 0 % (ref 0.0–0.2)

## 2022-12-19 LAB — COMPREHENSIVE METABOLIC PANEL
ALT: 15 U/L (ref 0–44)
AST: 19 U/L (ref 15–41)
Albumin: 3.3 g/dL — ABNORMAL LOW (ref 3.5–5.0)
Alkaline Phosphatase: 95 U/L (ref 38–126)
Anion gap: 9 (ref 5–15)
BUN: 16 mg/dL (ref 8–23)
CO2: 24 mmol/L (ref 22–32)
Calcium: 8.8 mg/dL — ABNORMAL LOW (ref 8.9–10.3)
Chloride: 105 mmol/L (ref 98–111)
Creatinine, Ser: 1.21 mg/dL — ABNORMAL HIGH (ref 0.44–1.00)
GFR, Estimated: 41 mL/min — ABNORMAL LOW (ref >60)
Glucose, Bld: 118 mg/dL — ABNORMAL HIGH (ref 70–99)
Potassium: 4.2 mmol/L (ref 3.5–5.1)
Sodium: 138 mmol/L (ref 135–145)
Total Bilirubin: 0.8 mg/dL (ref 0.3–1.2)
Total Protein: 6.4 g/dL — ABNORMAL LOW (ref 6.5–8.1)

## 2022-12-19 LAB — PROTIME-INR
INR: 1.1 (ref 0.8–1.2)
Prothrombin Time: 14.9 seconds (ref 11.4–15.2)

## 2022-12-19 LAB — HEMOGLOBIN AND HEMATOCRIT, BLOOD
HCT: 30.7 % — ABNORMAL LOW (ref 36.0–46.0)
Hemoglobin: 9.8 g/dL — ABNORMAL LOW (ref 12.0–15.0)

## 2022-12-19 LAB — PREPARE RBC (CROSSMATCH)

## 2022-12-19 MED ORDER — PANTOPRAZOLE SODIUM 40 MG IV SOLR
40.0000 mg | Freq: Two times a day (BID) | INTRAVENOUS | Status: DC
  Administered 2022-12-19 – 2022-12-23 (×8): 40 mg via INTRAVENOUS
  Filled 2022-12-19 (×9): qty 10

## 2022-12-19 MED ORDER — ACETAMINOPHEN 500 MG PO TABS
500.0000 mg | ORAL_TABLET | Freq: Four times a day (QID) | ORAL | Status: DC | PRN
  Administered 2022-12-20: 500 mg via ORAL
  Filled 2022-12-19 (×2): qty 1

## 2022-12-19 MED ORDER — SODIUM CHLORIDE 0.9% IV SOLUTION: Freq: Once | INTRAVENOUS | Status: AC

## 2022-12-19 MED ORDER — PROCHLORPERAZINE EDISYLATE 10 MG/2ML IJ SOLN: 5.0000 mg | Freq: Four times a day (QID) | INTRAMUSCULAR | Status: DC | PRN

## 2022-12-19 MED ORDER — IOHEXOL 350 MG/ML SOLN
75.0000 mL | Freq: Once | INTRAVENOUS | Status: AC | PRN
  Administered 2022-12-19: 75 mL via INTRAVENOUS

## 2022-12-19 MED ORDER — PANTOPRAZOLE SODIUM 40 MG IV SOLR
40.0000 mg | Freq: Once | INTRAVENOUS | Status: AC
  Administered 2022-12-19: 40 mg via INTRAVENOUS
  Filled 2022-12-19: qty 10

## 2022-12-19 MED ORDER — MIRTAZAPINE 15 MG PO TABS
7.5000 mg | ORAL_TABLET | Freq: Every day | ORAL | Status: DC
  Administered 2022-12-19 – 2022-12-22 (×4): 7.5 mg via ORAL
  Filled 2022-12-19 (×4): qty 1

## 2022-12-19 NOTE — ED Notes (Signed)
ED TO INPATIENT HANDOFF REPORT  ED Nurse Name and Phone #: [phone]  S Name/Age/Gender Christine Middleton 89 y.o. female Room/Bed: 009C/009C  Code Status   Code Status: Full Code  Home/SNF/Other Home Patient oriented to: self, place, time, and situation Is this baseline? Yes   Triage Complete: Triage complete  Chief Complaint Rectal bleeding [K62.5]  Triage Note Patient reports persistent rectal bleeding /bloody stools for several days , she takes Eliquis . No emesis or fever .    Allergies Allergies  Allergen Reactions   Amoxicillin     REACTION: rash   Codeine    Lipitor [Atorvastatin]     Muscle pain, stiff joints   Lisinopril    Nitrofurantoin    Tramadol Anxiety    Sedation, worsening depression.     Level of Care/Admitting Diagnosis ED Disposition     ED Disposition  Admit   Condition  --   Comment  Hospital Area: [facility] [[id number]]  Level of Care: Progressive [102]  Admit to Progressive based on following criteria: GI, ENDOCRINE disease patients with GI bleeding, acute liver failure or pancreatitis, stable with diabetic ketoacidosis or thyrotoxicosis (hypothyroid) state.  May admit patient to [facility] or [facility] if equivalent level of care is available:: No  Covid Evaluation: Asymptomatic - no recent exposure (last 10 days) testing not required  Diagnosis: Rectal bleeding [217577]  Admitting Physician: Darlin Drop [[id number]]  Attending Physician: Darlin Drop [[id number]]  Certification:: I certify this patient will need inpatient services for at least 2 midnights  Estimated Length of Stay: 2          B Medical/Surgery History Past Medical History:  Diagnosis Date   Diverticulitis    Esophageal stricture    GERD (gastroesophageal reflux disease)    Hyperlipidemia    Hypertension    Shingles    Stroke (HCC)    Vitamin B 12 deficiency    Past Surgical History:  Procedure Laterality Date   ABDOMINAL HYSTERECTOMY      APPENDECTOMY     COLONOSCOPY  12-09-08   per Dr. Jarold Motto (incomplete along with barium enema 12-10-08) with hemorrhoids  and diverticulae   COLONOSCOPY WITH PROPOFOL N/A 07/10/2019   Procedure: COLONOSCOPY WITH PROPOFOL;  Surgeon: Lemar Lofty., MD;  Location: [facility];  Service: Gastroenterology;  Laterality: N/A;   ESOPHAGOGASTRODUODENOSCOPY  09-2004   per Dr. Jarold Motto with dilatation    EYE SURGERY     HEMOSTASIS CLIP PLACEMENT  07/10/2019   Procedure: HEMOSTASIS CLIP PLACEMENT;  Surgeon: Lemar Lofty., MD;  Location: [facility];  Service: Gastroenterology;;   POLYPECTOMY  07/10/2019   Procedure: POLYPECTOMY;  Surgeon: Lemar Lofty., MD;  Location: [facility];  Service: Gastroenterology;;   TONSILLECTOMY       A IV Location/Drains/Wounds Patient Lines/Drains/Airways Status     Active Line/Drains/Airways     Name Placement date Placement time Site Days   Peripheral IV 12/19/22 18 G Anterior;Distal;Right;Upper Arm 12/19/22  2005  Arm  less than 1            Intake/Output Last 24 hours No intake or output data in the 24 hours ending 12/19/22 2203  Labs/Imaging Results for orders placed or performed during the hospital encounter of 12/19/22 (from the past 48 hour(s))  Type and screen [facility]     Status: None (Preliminary result)   Collection Time: 12/19/22  7:15 PM  Result Value Ref Range   ABO/RH(D) O POS  Antibody Screen NEG    Sample Expiration 12/22/2022,2359    Unit Number [id number]    Blood Component Type RED CELLS,LR    Unit division 00    Status of Unit ISSUED    Transfusion Status OK TO TRANSFUSE    Crossmatch Result      Compatible Performed at [facility], [address] [place], [place] [address]    Unit Number [id number]    Blood Component Type RED CELLS,LR    Unit division 00    Status of Unit ALLOCATED    Transfusion Status OK TO TRANSFUSE    Crossmatch Result Compatible    CBC with Differential     Status: Abnormal   Collection Time: 12/19/22  7:22 PM  Result Value Ref Range   WBC 6.5 4.0 - 10.5 K/uL   RBC 3.01 (L) 3.87 - 5.11 MIL/uL   Hemoglobin 8.7 (L) 12.0 - 15.0 g/dL   HCT 78.4 (L) 69.6 - 29.5 %   MCV 90.7 80.0 - 100.0 fL   MCH 28.9 26.0 - 34.0 pg   MCHC 31.9 30.0 - 36.0 g/dL   RDW 28.4 13.2 - 44.0 %   Platelets 233 150 - 400 K/uL   nRBC 0.0 0.0 - 0.2 %   Neutrophils Relative % 71 %   Neutro Abs 4.5 1.7 - 7.7 K/uL   Lymphocytes Relative 12 %   Lymphs Abs 0.8 0.7 - 4.0 K/uL   Monocytes Relative 11 %   Monocytes Absolute 0.7 0.1 - 1.0 K/uL   Eosinophils Relative 5 %   Eosinophils Absolute 0.3 0.0 - 0.5 K/uL   Basophils Relative 1 %   Basophils Absolute 0.1 0.0 - 0.1 K/uL   Immature Granulocytes 0 %   Abs Immature Granulocytes 0.01 0.00 - 0.07 K/uL    Comment: Performed at [facility], [address] [place], [place] [address]  Comprehensive metabolic panel     Status: Abnormal   Collection Time: 12/19/22  7:22 PM  Result Value Ref Range   Sodium 138 135 - 145 mmol/L   Potassium 4.2 3.5 - 5.1 mmol/L   Chloride 105 98 - 111 mmol/L   CO2 24 22 - 32 mmol/L   Glucose, Bld 118 (H) 70 - 99 mg/dL    Comment: Glucose reference range applies only to samples taken after fasting for at least 8 hours.   BUN 16 8 - 23 mg/dL   Creatinine, Ser 5.36 (H) 0.44 - 1.00 mg/dL   Calcium 8.8 (L) 8.9 - 10.3 mg/dL   Total Protein 6.4 (L) 6.5 - 8.1 g/dL   Albumin 3.3 (L) 3.5 - 5.0 g/dL   AST 19 15 - 41 U/L   ALT 15 0 - 44 U/L   Alkaline Phosphatase 95 38 - 126 U/L   Total Bilirubin 0.8 0.3 - 1.2 mg/dL   GFR, Estimated 41 (L) >60 mL/min    Comment: (NOTE) Calculated using the CKD-EPI Creatinine Equation (2021)    Anion gap 9 5 - 15    Comment: Performed at [facility], [address] [place], [place] [address]  Protime-INR     Status: None   Collection Time: 12/19/22  7:22 PM  Result Value Ref Range   Prothrombin Time 14.9 11.4 - 15.2  seconds   INR 1.1 0.8 - 1.2    Comment: (NOTE) INR goal varies based on device and disease states. Performed at [facility], [address], [place], [place]  [address]   Prepare RBC (crossmatch)     Status: None   Collection Time: 12/19/22  8:35 PM  Result Value Ref Range   Order Confirmation      ORDER PROCESSED BY BLOOD BANK Performed at [facility], [address], [place], [place] [address]    CT ANGIO GI BLEED  Result Date: 12/19/2022 CLINICAL DATA:  Lower GI bleed. EXAM: CTA ABDOMEN AND PELVIS WITHOUT AND WITH CONTRAST TECHNIQUE: Multidetector CT imaging of the abdomen and pelvis was performed using the standard protocol during bolus administration of intravenous contrast. Multiplanar reconstructed images and MIPs were obtained and reviewed to evaluate the vascular anatomy. RADIATION DOSE REDUCTION: This exam was performed according to the departmental dose-optimization program which includes automated exposure control, adjustment of the mA and/or kV according to patient size and/or use of iterative reconstruction technique. CONTRAST:  75mL OMNIPAQUE IOHEXOL 350 MG/ML SOLN COMPARISON:  CT abdomen pelvis dated 06/17/2022. FINDINGS: VASCULAR Aorta: Moderate aortoiliac atherosclerotic disease. No aneurysmal dilatation or dissection. No periaortic fluid collection. Celiac: Atherosclerotic calcification of the origin of the celiac artery. The celiac trunk and its major branches appear patent. SMA: Atherosclerotic calcification of the origin of the SMA. The SMA is patent. Renals: Atherosclerotic calcification of the origins of the renal arteries. The renal arteries are patent. IMA: The IMA is patent. Inflow: Mild atherosclerotic calcification of the iliac arteries. The iliac arteries are patent. No aneurysmal dilatation or dissection. Proximal Outflow: Bilateral common femoral and visualized portions of the superficial and profunda femoral arteries are patent without evidence of  aneurysm, dissection, vasculitis or significant stenosis. Veins: The IVC is unremarkable.  No portal venous gas. Review of the MIP images confirms the above findings. NON-VASCULAR Lower chest: Partially visualized small right pleural effusion with partial compressive atelectasis of the right lower lobe. Pneumonia is not excluded clinical correlation is recommended. Hepatobiliary: Indeterminate 2 cm hypodense lesion with calcification in the right lobe of the liver, similar to prior CT. The liver is otherwise unremarkable. No biliary dilatation. The gallbladder is unremarkable. Pancreas: Unremarkable. No pancreatic ductal dilatation or surrounding inflammatory changes. Spleen: Normal in size without focal abnormality. Adrenals/Urinary Tract: The adrenal glands are unremarkable. There is no hydronephrosis or nephrolithiasis on either side. There is symmetric enhancement and excretion of contrast by both kidneys. The visualized ureters and urinary bladder appear unremarkable. Stomach/Bowel: There is sigmoid diverticulosis without active inflammatory changes. There is no bowel obstruction or active inflammation. No evidence of active GI bleed. Appendectomy. Lymphatic: No adenopathy. Reproductive: Hysterectomy.  No adnexal masses. Other: Small fat containing umbilical hernia. Musculoskeletal: Osteopenia with degenerative changes of the spine. No acute osseous pathology. IMPRESSION: 1. No acute intra-abdominal or pelvic pathology. No evidence of active GI bleed. 2. Sigmoid diverticulosis without active inflammatory changes. 3. Partially visualized small right pleural effusion with partial compressive atelectasis of the right lower lobe. Pneumonia is not excluded clinical correlation is recommended. Electronically Signed   By: Elgie Collard M.D.   On: 12/19/2022 21:01    Pending Labs Unresulted Labs (From admission, onward)     Start     Ordered   12/19/22 2153  Hemoglobin and hematocrit, blood  Now then every 6  hours,   R      12/19/22 2152            Vitals/Pain Today's Vitals   12/19/22 2136 12/19/22 2145 12/19/22 2148 12/19/22 2200  BP: (!) 142/95 (!) 150/73  (!) 149/65  Pulse: 96 (!) 41  61  Resp: 19 (!) 21  (!) 27  Temp: 98.1 F (36.7 C)     TempSrc: Oral     SpO2: 97% 97%  93%  PainSc:   0-No pain     Isolation Precautions No active isolations  Medications Medications  0.9 %  sodium chloride infusion (Manually program via Guardrails IV Fluids) (has no administration in time range)  pantoprazole (PROTONIX) injection 40 mg (40 mg Intravenous Given 12/19/22 2005)  iohexol (OMNIPAQUE) 350 MG/ML injection 75 mL (75 mLs Intravenous Contrast Given 12/19/22 2043)    Mobility walks     Focused Assessments    R Recommendations: See Admitting Provider Note  Report given to:   Additional Notes:  hemoglobin has dropped 4.0 since April 16th.

## 2022-12-19 NOTE — ED Provider Notes (Signed)
[place] EMERGENCY DEPARTMENT AT [facility] Provider Note   CSN: [id number] Arrival date & time: 12/19/22  1859     History  Chief Complaint  Patient presents with   GI Bleed    Bloody Stools    Hemlata Varnadore Wheat is a 89 y.o. female with a past medical history of CVA, paroxysmal A-fib anticoagulated with Eliquis, hypertension and B12 deficiency presenting today with concern for bloody stools.  Patient reports that she has intermittent bright red blood per rectum however she has been bleeding copiously since Friday.  She reports that it stopped on Saturday but Sunday she started to notice bright red blood in the toilet bowl and she has been wearing briefs to catch the active bleeding.  History of diverticulosis.  She denies any abdominal pain, nausea, vomiting or further concerns.  Tells me that she decided to come to the emergency department today because there was blood running down her legs and on the floor after she tried to use the restroom at 4 PM.  Reports it has been nonstop ever since  HPI     Home Medications Prior to Admission medications   Medication Sig Start Date End Date Taking? Authorizing Provider  Acetaminophen (TYLENOL 8 HOUR PO) Take by mouth every 8 (eight) hours as needed.    [provider]  apixaban (ELIQUIS) 2.5 MG TABS tablet Take 1 tablet (2.5 mg total) by mouth 2 (two) times daily. 11/14/22   Nelwyn Salisbury, MD  atenolol (TENORMIN) 50 MG tablet TAKE 1 TABLET BY MOUTH EVERY DAY 08/08/22   Nelwyn Salisbury, MD  docusate sodium (COLACE) 100 MG capsule Take 100 mg by mouth 2 (two) times daily.    [provider]  losartan (COZAAR) 25 MG tablet Take 1 tablet (25 mg total) by mouth every evening. 11/14/22   Nelwyn Salisbury, MD  mirtazapine (REMERON) 15 MG tablet Take 1/2 (7.5 mg) tablet by mouth at bedtime 11/14/22   Nelwyn Salisbury, MD  pantoprazole (PROTONIX) 40 MG tablet TAKE 1 TABLET BY MOUTH EVERY DAY 11/03/22   Nelwyn Salisbury, MD   polyethylene glycol (MIRALAX / GLYCOLAX) 17 g packet Take 17 g by mouth daily. Patient taking differently: Take 17 g by mouth as needed. 07/12/19   Jae Dire, MD      Allergies    Amoxicillin, Codeine, Lipitor [atorvastatin], Lisinopril, Nitrofurantoin, and Tramadol    Review of Systems   Review of Systems  Physical Exam Updated Vital Signs BP (!) 161/95 (BP Location: Right Arm)   Pulse (!) 113   Temp 98.7 F (37.1 C) (Oral)   Resp 18   SpO2 99%  Physical Exam Vitals and nursing note reviewed.  Constitutional:      General: She is not in acute distress.    Appearance: Normal appearance. She is not ill-appearing.  HENT:     Head: Normocephalic and atraumatic.  Eyes:     General: No scleral icterus.    Conjunctiva/sclera: Conjunctivae normal.  Pulmonary:     Effort: Pulmonary effort is normal. No respiratory distress.  Genitourinary:    Comments: Ruptured external hemorrhoid however hemorrhoids do not appear to be bleeding.  Brief with a significant amount of bleeding given that it was changed 45 minutes ago. Skin:    Findings: No rash.  Neurological:     Mental Status: She is alert.  Psychiatric:        Mood and Affect: Mood normal.     ED  Results / Procedures / Treatments   Labs (all labs ordered are listed, but only abnormal results are displayed) Labs Reviewed  CBC WITH DIFFERENTIAL/PLATELET - Abnormal; Notable for the following components:      Result Value   RBC 3.01 (*)    Hemoglobin 8.7 (*)    HCT 27.3 (*)    All other components within normal limits  COMPREHENSIVE METABOLIC PANEL - Abnormal; Notable for the following components:   Glucose, Bld 118 (*)    Creatinine, Ser 1.21 (*)    Calcium 8.8 (*)    Total Protein 6.4 (*)    Albumin 3.3 (*)    GFR, Estimated 41 (*)    All other components within normal limits  PROTIME-INR  TYPE AND SCREEN  PREPARE RBC (CROSSMATCH)    EKG None  Radiology CT ANGIO GI BLEED  Result Date:  12/19/2022 CLINICAL DATA:  Lower GI bleed. EXAM: CTA ABDOMEN AND PELVIS WITHOUT AND WITH CONTRAST TECHNIQUE: Multidetector CT imaging of the abdomen and pelvis was performed using the standard protocol during bolus administration of intravenous contrast. Multiplanar reconstructed images and MIPs were obtained and reviewed to evaluate the vascular anatomy. RADIATION DOSE REDUCTION: This exam was performed according to the departmental dose-optimization program which includes automated exposure control, adjustment of the mA and/or kV according to patient size and/or use of iterative reconstruction technique. CONTRAST:  75mL OMNIPAQUE IOHEXOL 350 MG/ML SOLN COMPARISON:  CT abdomen pelvis dated 06/17/2022. FINDINGS: VASCULAR Aorta: Moderate aortoiliac atherosclerotic disease. No aneurysmal dilatation or dissection. No periaortic fluid collection. Celiac: Atherosclerotic calcification of the origin of the celiac artery. The celiac trunk and its major branches appear patent. SMA: Atherosclerotic calcification of the origin of the SMA. The SMA is patent. Renals: Atherosclerotic calcification of the origins of the renal arteries. The renal arteries are patent. IMA: The IMA is patent. Inflow: Mild atherosclerotic calcification of the iliac arteries. The iliac arteries are patent. No aneurysmal dilatation or dissection. Proximal Outflow: Bilateral common femoral and visualized portions of the superficial and profunda femoral arteries are patent without evidence of aneurysm, dissection, vasculitis or significant stenosis. Veins: The IVC is unremarkable.  No portal venous gas. Review of the MIP images confirms the above findings. NON-VASCULAR Lower chest: Partially visualized small right pleural effusion with partial compressive atelectasis of the right lower lobe. Pneumonia is not excluded clinical correlation is recommended. Hepatobiliary: Indeterminate 2 cm hypodense lesion with calcification in the right lobe of the liver,  similar to prior CT. The liver is otherwise unremarkable. No biliary dilatation. The gallbladder is unremarkable. Pancreas: Unremarkable. No pancreatic ductal dilatation or surrounding inflammatory changes. Spleen: Normal in size without focal abnormality. Adrenals/Urinary Tract: The adrenal glands are unremarkable. There is no hydronephrosis or nephrolithiasis on either side. There is symmetric enhancement and excretion of contrast by both kidneys. The visualized ureters and urinary bladder appear unremarkable. Stomach/Bowel: There is sigmoid diverticulosis without active inflammatory changes. There is no bowel obstruction or active inflammation. No evidence of active GI bleed. Appendectomy. Lymphatic: No adenopathy. Reproductive: Hysterectomy.  No adnexal masses. Other: Small fat containing umbilical hernia. Musculoskeletal: Osteopenia with degenerative changes of the spine. No acute osseous pathology. IMPRESSION: 1. No acute intra-abdominal or pelvic pathology. No evidence of active GI bleed. 2. Sigmoid diverticulosis without active inflammatory changes. 3. Partially visualized small right pleural effusion with partial compressive atelectasis of the right lower lobe. Pneumonia is not excluded clinical correlation is recommended. Electronically Signed   By: Elgie Collard M.D.   On: 12/19/2022 21:01  Procedures .Critical Care  Performed by: Saddie Benders, PA-C Authorized by: Saddie Benders, PA-C   Critical care provider statement:    Critical care time (minutes):  30   Critical care time was exclusive of:  Separately billable procedures and treating other patients   Critical care was necessary to treat or prevent imminent or life-threatening deterioration of the following conditions:  Shock and circulatory failure   Critical care was time spent personally by me on the following activities:  Development of treatment plan with patient or surrogate, discussions with consultants, discussions  with primary provider, evaluation of patient's response to treatment, ordering and performing treatments and interventions, ordering and review of laboratory studies, ordering and review of radiographic studies, pulse oximetry, re-evaluation of patient's condition, review of old charts and obtaining history from patient or surrogate   I assumed direction of critical care for this patient from another provider in my specialty: no     Care discussed with: admitting provider      Medications Ordered in ED Medications  0.9 %  sodium chloride infusion (Manually program via Guardrails IV Fluids) (has no administration in time range)  pantoprazole (PROTONIX) injection 40 mg (40 mg Intravenous Given 12/19/22 2005)  iohexol (OMNIPAQUE) 350 MG/ML injection 75 mL (75 mLs Intravenous Contrast Given 12/19/22 2043)    ED Course/ Medical Decision Making/ A&P Clinical Course as of 12/19/22 2146  Tue Dec 19, 2022  2043 Patient agrees to blood transfusion.  No history of dementia despite her age and she is able to consent. [MR]  2129 Patient's vital signs are stable.  She is not tachycardic or tachypneic.  Denies any dizziness, lightheadedness or shortness of breath.  Well-appearing at the bedside [MR]  2131 [facility] is hesitant to admit this patient due to high risk for decompensation.  Critical care consulted.  Dr. Margo Aye with the hospitalist team reports she will come and see the patient. [MR]  2132 Spoke with Dr. Marcos Eke with critical care.  She will consult on the patient if requested by hospitalist team. [MR]    Clinical Course User Index [MR] Dayanara Sherrill, Gabriel Cirri, PA-C                             Medical Decision Making Amount and/or Complexity of Data Reviewed Labs: ordered. Radiology: ordered.  Risk Prescription drug management. Decision regarding hospitalization.   89 year old female presenting today with bleeding from the rectum.  Differential includes but is not limited to bleeding hemorrhoid,  diverticular bleed, peptic ulcer disease.  This is not an exhaustive differential.    Past Medical History / Co-morbidities / Social History: A-fib on Eliquis, diverticulosis, history of GI bleed, multiple previous strokes   Additional history: Per chart review patient was seen in November 2023 with rectal bleeding.  It was thought to be a diverticular bleed at that time.  She has a history of iron deficiency anemia at baseline as well.   Physical Exam: Pertinent physical exam findings include Bright red blood in the patient's brief, on her buttocks and in the toilet bowl  Lab Tests: I ordered, and personally interpreted labs.  The pertinent results include: Hemoglobin 8.7.,  12.1 patient 1 month ago   Imaging Studies: I ordered and independently visualized and interpreted his CT angiogram bleed and I agree with the radiologist that there are no acute findings..   Medications: I ordered medication including pantoprazole.   Consultations Obtained: I spoke with Dr.  Cunningham with [facility].  He reports that they likely will not scope this patient however will monitor the bleeding.  He will follow patient's admission however I will contact him if there is any acute drop in the hemoglobin this evening.  MDM/Disposition: This is a 89 year old female who presents today with bright red blood per rectum and bloody stools since Friday.  Reports a history of this.  Reports that it happens intermittently however for the past few days it has been nearly constant.  She is on Eliquis.  On arrival I was able to see the patient in the restroom.  She had bright red blood absorbed in her briefs as well as surrounding the toilet bowl.  She did have a ruptured external hemorrhoid however this likely is not the cause of her continuous heavy bleeding.  She has had a 3.4 point hemoglobin drop in the past 1 month.  She requires admission for management of GI bleeding. Dr. Tomasa Rand with [facility] made aware. Dr.  Marcos Eke with critical care was consulted per hospitalist request and will be available for consultation.  I discussed this case with my attending physician Dr. Deretha Emory who cosigned this note including patient's presenting symptoms, physical exam, and planned diagnostics and interventions. Attending physician stated agreement with plan or made changes to plan which were implemented.     Final Clinical Impression(s) / ED Diagnoses Final diagnoses:  Gastrointestinal hemorrhage, unspecified gastrointestinal hemorrhage type    Rx / DC Orders ED Discharge Orders     None      Admit to Dr. Alba Destine, [place] Ndiaye A, PA-C 12/19/22 9562    Vanetta Mulders, MD 12/19/22 2315

## 2022-12-19 NOTE — ED Notes (Signed)
MD Provider at bedside. 

## 2022-12-19 NOTE — H&P (Addendum)
History and Physical  Christine Middleton ZOX:[id number] DOB: 08/12/1922 DOA: 12/19/2022  Referring physician: Georgeanna Lea, PA-EDP  PCP: Nelwyn Salisbury, MD  Outpatient Specialists: GI Patient coming from: Home  Chief Complaint: Rectal bleeding    HPI: Christine Middleton is a 89 y.o. female with medical history significant for paroxysmal A-fib on Eliquis, chronic HFpEF 60-65%, history of recurrent rectal bleed, history of internal and external hemorrhoids, diverticulosis in the distal rectum and the rectosigmoid colon and in the sigmoid colon, findings seen on colonoscopy done in 2020 with GI recommendation at that time for referral to colorectal surgery to discuss options for hemorrhoid therapy if issues persist, GERD, essential hypertension, history of CVA, chronic anxiety/depression, who presented to [facility] ED from home due to persistent rectal bleeding.  Has been ongoing intermittently for the past few days however today since 4 PM she has been bleeding consistently.  Denies having any abdominal cramping.  No abdominal or rectal pain.  No nausea or vomiting.  No reported fevers or chills.  No syncopal episode reported.  She presented to the ED for further evaluation and management.  In the ED, the patient continues to have rectal bleeding.  Lab work notable for acute blood loss anemia with hemoglobin of 8.7 K from baseline of 12.1 K, 71-month ago.  2 units PRBCs have been ordered to be transfused due to ongoing GI bleed.  EDP discussed the case with [facility], Dr. Tomasa Rand, GI will see in consultation.  EDP also discussed the case with critical care medicine, Dr. Jayme Cloud, CCM will see in consultation.  Admitted by [facility], hospitalist service to progressive care unit as inpatient status.  The patient's son Christine Middleton was updated via phone.  CODE STATUS confirmed for now as full code by [employer].  If the patient's code status changes he will call the hospital and let us know.  ED Course: Temperature  98.1.  BP 149/65, pulse 61, respiration rate 27, O2 saturation 93% on room air.  Lab studies notable for hemoglobin 8.7 from baseline of 12.1.  Serum glucose 118, BUN 16, creatinine 1.21 with GFR 41.  Albumin 3.3.  Total protein 6.4.  Review of Systems: Review of systems as noted in the HPI. All other systems reviewed and are negative.   Past Medical History:  Diagnosis Date   Diverticulitis    Esophageal stricture    GERD (gastroesophageal reflux disease)    Hyperlipidemia    Hypertension    Shingles    Stroke [facility])    Vitamin B 12 deficiency    Past Surgical History:  Procedure Laterality Date   ABDOMINAL HYSTERECTOMY     APPENDECTOMY     COLONOSCOPY  12-09-08   per Dr. Jarold Motto (incomplete along with barium enema 12-10-08) with hemorrhoids  and diverticulae   COLONOSCOPY WITH PROPOFOL N/A 07/10/2019   Procedure: COLONOSCOPY WITH PROPOFOL;  Surgeon: Lemar Lofty., MD;  Location: [facility];  Service: Gastroenterology;  Laterality: N/A;   ESOPHAGOGASTRODUODENOSCOPY  09-2004   per Dr. Jarold Motto with dilatation    EYE SURGERY     HEMOSTASIS CLIP PLACEMENT  07/10/2019   Procedure: HEMOSTASIS CLIP PLACEMENT;  Surgeon: Lemar Lofty., MD;  Location: [facility];  Service: Gastroenterology;;   POLYPECTOMY  07/10/2019   Procedure: POLYPECTOMY;  Surgeon: Lemar Lofty., MD;  Location: [facility];  Service: Gastroenterology;;   TONSILLECTOMY      Social History:  reports that she has never smoked. She has never used smokeless tobacco. She reports  that she does not drink alcohol and does not use drugs.   Allergies  Allergen Reactions   Amoxicillin     REACTION: rash   Codeine    Lipitor [Atorvastatin]     Muscle pain, stiff joints   Lisinopril    Nitrofurantoin    Tramadol Anxiety    Sedation, worsening depression.     Family History  Problem Relation Age of Onset   Heart attack Mother    Heart disease Father    Breast cancer Other     Diabetes Other    Hypertension Other    Stroke Other    Heart disease Other    Stroke Sister    Cancer Brother    Cancer Brother    Diabetes Brother       Prior to Admission medications   Medication Sig Start Date End Date Taking? Authorizing Provider  Acetaminophen (TYLENOL 8 HOUR PO) Take by mouth every 8 (eight) hours as needed.    [provider]  apixaban (ELIQUIS) 2.5 MG TABS tablet Take 1 tablet (2.5 mg total) by mouth 2 (two) times daily. 11/14/22   Nelwyn Salisbury, MD  atenolol (TENORMIN) 50 MG tablet TAKE 1 TABLET BY MOUTH EVERY DAY 08/08/22   Nelwyn Salisbury, MD  docusate sodium (COLACE) 100 MG capsule Take 100 mg by mouth 2 (two) times daily.    [provider]  losartan (COZAAR) 25 MG tablet Take 1 tablet (25 mg total) by mouth every evening. 11/14/22   Nelwyn Salisbury, MD  mirtazapine (REMERON) 15 MG tablet Take 1/2 (7.5 mg) tablet by mouth at bedtime 11/14/22   Nelwyn Salisbury, MD  pantoprazole (PROTONIX) 40 MG tablet TAKE 1 TABLET BY MOUTH EVERY DAY 11/03/22   Nelwyn Salisbury, MD  polyethylene glycol (MIRALAX / GLYCOLAX) 17 g packet Take 17 g by mouth daily. Patient taking differently: Take 17 g by mouth as needed. 07/12/19   Jae Dire, MD    Physical Exam: BP (!) 142/95   Pulse 96   Temp 98.1 F (36.7 C) (Oral)   Resp 19   SpO2 97%   General: 89 y.o. year-old female well developed well nourished in no acute distress.  Alert and oriented x3. Cardiovascular: Regular rate and rhythm with no rubs or gallops.  No thyromegaly or JVD noted.  No lower extremity edema. 2/4 pulses in all 4 extremities. Respiratory: Clear to auscultation with no wheezes or rales. Good inspiratory effort. Abdomen: Soft nontender nondistended with normal bowel sounds x4 quadrants. Muskuloskeletal: No cyanosis, clubbing or edema noted bilaterally Neuro: CN II-XII intact, strength, sensation, reflexes Skin: No ulcerative lesions noted or rashes Psychiatry: Judgement and insight  appear normal. Mood is appropriate for condition and setting          Labs on Admission:  Basic Metabolic Panel: Recent Labs  Lab 12/19/22 1922  NA 138  K 4.2  CL 105  CO2 24  GLUCOSE 118*  BUN 16  CREATININE 1.21*  CALCIUM 8.8*   Liver Function Tests: Recent Labs  Lab 12/19/22 1922  AST 19  ALT 15  ALKPHOS 95  BILITOT 0.8  PROT 6.4*  ALBUMIN 3.3*   No results for input(s): "LIPASE", "AMYLASE" in the last 168 hours. No results for input(s): "AMMONIA" in the last 168 hours. CBC: Recent Labs  Lab 12/19/22 1922  WBC 6.5  NEUTROABS 4.5  HGB 8.7*  HCT 27.3*  MCV 90.7  PLT 233   Cardiac Enzymes: No results  for input(s): "CKTOTAL", "CKMB", "CKMBINDEX", "TROPONINI" in the last 168 hours.  BNP (last 3 results) No results for input(s): "BNP" in the last 8760 hours.  ProBNP (last 3 results) No results for input(s): "PROBNP" in the last 8760 hours.  CBG: No results for input(s): "GLUCAP" in the last 168 hours.  Radiological Exams on Admission: CT ANGIO GI BLEED  Result Date: 12/19/2022 CLINICAL DATA:  Lower GI bleed. EXAM: CTA ABDOMEN AND PELVIS WITHOUT AND WITH CONTRAST TECHNIQUE: Multidetector CT imaging of the abdomen and pelvis was performed using the standard protocol during bolus administration of intravenous contrast. Multiplanar reconstructed images and MIPs were obtained and reviewed to evaluate the vascular anatomy. RADIATION DOSE REDUCTION: This exam was performed according to the departmental dose-optimization program which includes automated exposure control, adjustment of the mA and/or kV according to patient size and/or use of iterative reconstruction technique. CONTRAST:  75mL OMNIPAQUE IOHEXOL 350 MG/ML SOLN COMPARISON:  CT abdomen pelvis dated 06/17/2022. FINDINGS: VASCULAR Aorta: Moderate aortoiliac atherosclerotic disease. No aneurysmal dilatation or dissection. No periaortic fluid collection. Celiac: Atherosclerotic calcification of the origin of the  celiac artery. The celiac trunk and its major branches appear patent. SMA: Atherosclerotic calcification of the origin of the SMA. The SMA is patent. Renals: Atherosclerotic calcification of the origins of the renal arteries. The renal arteries are patent. IMA: The IMA is patent. Inflow: Mild atherosclerotic calcification of the iliac arteries. The iliac arteries are patent. No aneurysmal dilatation or dissection. Proximal Outflow: Bilateral common femoral and visualized portions of the superficial and profunda femoral arteries are patent without evidence of aneurysm, dissection, vasculitis or significant stenosis. Veins: The IVC is unremarkable.  No portal venous gas. Review of the MIP images confirms the above findings. NON-VASCULAR Lower chest: Partially visualized small right pleural effusion with partial compressive atelectasis of the right lower lobe. Pneumonia is not excluded clinical correlation is recommended. Hepatobiliary: Indeterminate 2 cm hypodense lesion with calcification in the right lobe of the liver, similar to prior CT. The liver is otherwise unremarkable. No biliary dilatation. The gallbladder is unremarkable. Pancreas: Unremarkable. No pancreatic ductal dilatation or surrounding inflammatory changes. Spleen: Normal in size without focal abnormality. Adrenals/Urinary Tract: The adrenal glands are unremarkable. There is no hydronephrosis or nephrolithiasis on either side. There is symmetric enhancement and excretion of contrast by both kidneys. The visualized ureters and urinary bladder appear unremarkable. Stomach/Bowel: There is sigmoid diverticulosis without active inflammatory changes. There is no bowel obstruction or active inflammation. No evidence of active GI bleed. Appendectomy. Lymphatic: No adenopathy. Reproductive: Hysterectomy.  No adnexal masses. Other: Small fat containing umbilical hernia. Musculoskeletal: Osteopenia with degenerative changes of the spine. No acute osseous  pathology. IMPRESSION: 1. No acute intra-abdominal or pelvic pathology. No evidence of active GI bleed. 2. Sigmoid diverticulosis without active inflammatory changes. 3. Partially visualized small right pleural effusion with partial compressive atelectasis of the right lower lobe. Pneumonia is not excluded clinical correlation is recommended. Electronically Signed   By: Elgie Collard M.D.   On: 12/19/2022 21:01    EKG: I independently viewed the EKG done and my findings are as followed: None available at the time of this visit.  Assessment/Plan Present on Admission:  Rectal bleeding  Principal Problem:   Rectal bleeding  Painless rectal bleeding, unclear etiology History of internal and external bleeding hemorrhoids seen on colonoscopy in 2020 At that time recommendations were for referral to colorectal surgery to discuss for hemorrhoid therapy if issues persist. [facility] has been consulted by EDP.  CCM also consulted to assist with the management. Maintain hemoglobin above 8.0 K and closely monitor vital signs. 2 units PRBCs ordered to be transfused in the ED Maintain MAP greater than 65 Care order placed for nursing staff to alert MD immediately for recurrent rectal bleeding and hypotension. Closely monitor in stepdown/progressive care unit. Serial H&H every 6 hours N.p.o. except sips and meds, until seen by GI.  Acute blood loss anemia secondary to above Hemoglobin 8.7 K on presentation from baseline of 12.1 K Follow H&H Transfuse hemoglobin less than 8 K Transfuse when indicated Closely monitor vital signs and maintain MAP greater than 65  GERD Prior to admission on po Protonix 40 mg daily Will start IV Protonix 40 mg BID until upper GI bleed can be ruled out.  Paroxysmal A-fib on Eliquis Continue to hold off home Eliquis DOAC was not reversed at this time, not hypotensive, not hemodynamically unstable at this time. Reverse DOAC if the patient becomes hemodynamically  unstable. GI to see in consultation, resume Eliquis when okay with GI.  CKD 3B Appears to be at her baseline creatinine 1.2 with GFR 41 Avoid nephrotoxic agents, dehydration and hypotension. Monitor urine output and repeat chemistry panel in the AM  Hypoalbuminemia Albumin 3.3 Monitor.  Essential hypertension Hold off home oral antihypertensives, atenolol and losartan, to avoid hypotension Closely monitor vital signs.  Chronic anxiety/depression Resume home Remeron.  Chronic HFpEF 60-65% Last 2D echo done on 08/18/2016 revealed LVEF 60 to 65% with grade 2 diastolic dysfunction. Hold off home beta-blocker and ARB to avoid hypotension Start strict I's and O's and daily weight   DVT prophylaxis: SCDs, home Eliquis on hold due to ongoing rectal bleeding.  Code Status: Full code.  Family Communication: Updated the patient's son Christine Middleton via phone on 12/19/2022  Disposition Plan: Admit to progressive care unit  Consults called: GI and CCM consulted by EDP.  Admission status: Inpatient status.   Status is: Inpatient The patient requires at least 2 midnights for further evaluation and treatment of present condition.   Darlin Drop MD [facility] Pager [phone]  If 7PM-7AM, please contact night-coverage [email or web address] Password [facility]  12/19/2022, 10:01 PM

## 2022-12-19 NOTE — ED Triage Notes (Signed)
Patient reports persistent rectal bleeding /bloody stools for several days , she takes Eliquis . No emesis or fever .

## 2022-12-20 DIAGNOSIS — K5790 Diverticulosis of intestine, part unspecified, without perforation or abscess without bleeding: Secondary | ICD-10-CM

## 2022-12-20 DIAGNOSIS — Z7901 Long term (current) use of anticoagulants: Secondary | ICD-10-CM | POA: Diagnosis not present

## 2022-12-20 DIAGNOSIS — K648 Other hemorrhoids: Secondary | ICD-10-CM

## 2022-12-20 DIAGNOSIS — K625 Hemorrhage of anus and rectum: Secondary | ICD-10-CM | POA: Diagnosis not present

## 2022-12-20 DIAGNOSIS — I4891 Unspecified atrial fibrillation: Secondary | ICD-10-CM

## 2022-12-20 DIAGNOSIS — D649 Anemia, unspecified: Secondary | ICD-10-CM

## 2022-12-20 LAB — HEMOGLOBIN AND HEMATOCRIT, BLOOD
HCT: 31.6 % — ABNORMAL LOW (ref 36.0–46.0)
HCT: 29.5 % — ABNORMAL LOW (ref 36.0–46.0)
Hemoglobin: 10.4 g/dL — ABNORMAL LOW (ref 12.0–15.0)
Hemoglobin: 9.7 g/dL — ABNORMAL LOW (ref 12.0–15.0)

## 2022-12-20 LAB — BPAM RBC
Blood Product Expiration Date: 202406122359
Unit Type and Rh: 5100

## 2022-12-20 MED ORDER — POLYETHYLENE GLYCOL 3350 17 G PO PACK: 17.0000 g | PACK | Freq: Every day | ORAL | Status: DC | PRN

## 2022-12-20 MED ORDER — HYDROCORTISONE ACETATE 25 MG RE SUPP
25.0000 mg | Freq: Two times a day (BID) | RECTAL | Status: DC
  Administered 2022-12-20 – 2022-12-22 (×6): 25 mg via RECTAL
  Filled 2022-12-20 (×7): qty 1

## 2022-12-20 MED ORDER — METOPROLOL TARTRATE 50 MG PO TABS
50.0000 mg | ORAL_TABLET | Freq: Two times a day (BID) | ORAL | Status: DC
  Administered 2022-12-20 – 2022-12-23 (×7): 50 mg via ORAL
  Filled 2022-12-20 (×7): qty 1

## 2022-12-20 MED ORDER — DOCUSATE SODIUM 100 MG PO CAPS
100.0000 mg | ORAL_CAPSULE | Freq: Two times a day (BID) | ORAL | Status: DC
  Administered 2022-12-20 (×2): 100 mg via ORAL
  Filled 2022-12-20 (×2): qty 1

## 2022-12-20 MED ORDER — KCL IN DEXTROSE-NACL 10-5-0.45 MEQ/L-%-% IV SOLN
INTRAVENOUS | Status: DC
  Filled 2022-12-20 (×2): qty 1000

## 2022-12-20 NOTE — Progress Notes (Signed)
[facility] night cross cover note:   I was notified by RN that H&H following transfusion of 1 unit PRBC has improved from 8.7-9.8.  Patient currently asymptomatic, with heart rates in the 80s and most recent blood pressure 149/65.  No additional evidence of active bleed reported.  GI has already been consulted on this patient.  At this time, there is an order to transfusion additional 1 unit PRBC.  will hold off on transfusion of this 2nd unit prbc for now given an even better than anticipated improvement in hgb from 8.7 to 9.8 with one unit prbc, stable VS, asymptomatic patient and not e/o active additional bleed.    Newton Pigg, DO Hospitalist

## 2022-12-20 NOTE — Evaluation (Signed)
Physical Therapy Evaluation Patient Details Name: Christine Middleton MRN: [id number] DOB: [birth year] Today's Date: 12/20/2022  History of Present Illness  89 y.o. female 89 y.o. female who presented from home due to persistent rectal bleeding on 5/21. PMH: paroxysmal A-fib on Eliquis, chronic HFpEF 60-65%, history of recurrent rectal bleed, history of internal and external hemorrhoids, diverticulosis in the distal rectum and the rectosigmoid colon and in the sigmoid colon, findings seen on colonoscopy done in 2020 with GI recommendation at that time for referral to colorectal surgery to discuss options for hemorrhoid therapy if issues persist, GERD, essential hypertension, history of CVA, chronic anxiety/depression,   Clinical Impression  Pt admitted with above. Pt pleasant and cooperative and near baseline. Pt able to ambulate 120' with R HHA. Pt's son present and reports they can provide 24/7 supervision. Pt reports feeling weaker than normal but much better than when she came in. Acute PT to cont to follow to minimize deconditioning. Will have mobility team work with patient as well.       Recommendations for follow up therapy are one component of a multi-disciplinary discharge planning process, led by the attending physician.  Recommendations may be updated based on patient status, additional functional criteria and insurance authorization.  Follow Up Recommendations       Assistance Recommended at Discharge Frequent or constant Supervision/Assistance (initially 24/7 supervision, son reports between children they can arrange)  Patient can return home with the following  A little help with walking and/or transfers;A little help with bathing/dressing/bathroom;Help with stairs or ramp for entrance    Equipment Recommendations None recommended by PT (has recommended DME)  Recommendations for Other Services       Functional Status Assessment Patient has had a recent decline in their functional  status and demonstrates the ability to make significant improvements in function in a reasonable and predictable amount of time.     Precautions / Restrictions Precautions Precautions: Fall Precaution Comments: active rectal bleeding Restrictions Weight Bearing Restrictions: No      Mobility  Bed Mobility Overal bed mobility: Modified Independent             General bed mobility comments: HOB elevated, increased time    Transfers Overall transfer level: Needs assistance Equipment used: None Transfers: Sit to/from Stand Sit to Stand: Min guard           General transfer comment: min guard for safety, denies dizziness    Ambulation/Gait Ambulation/Gait assistance: Min assist Gait Distance (Feet): 120 Feet Assistive device: 1 person hand held assist Gait Pattern/deviations: Step-to pattern, Decreased stride length, Trunk flexed Gait velocity: appropriate for age     General Gait Details: pt with fluid gait pattern and age appropriate cadence. pt reports "I don't feel as strong as I'd like to be." Pt denies dizziness. no episode of LOB, benefits from R HHA  Stairs            Wheelchair Mobility    Modified Rankin (Stroke Patients Only)       Balance Overall balance assessment: Mild deficits observed, not formally tested                                           Pertinent Vitals/Pain Pain Assessment Pain Assessment: No/denies pain    Home Living Family/patient expects to be discharged to:: Private residence Living Arrangements: Children Available Help at Discharge: Family;Available 24  hours/day Type of Home: House Home Access: Ramped entrance       Home Layout: Multi-level;Able to live on main level with bedroom/bathroom Home Equipment: Rolling Walker (2 wheels);Rollator (4 wheels);BSC/3in1;Cane - single point;Tub bench Additional Comments: pt lives with son who works part time but has other children that come over daily     Prior Function Prior Level of Function : Independent/Modified Independent             Mobility Comments: uses SPC when going outside, doesn't drive ADLs Comments: indep, son does cooking and household chores     Hand Dominance   Dominant Hand: Right    Extremity/Trunk Assessment   Upper Extremity Assessment Upper Extremity Assessment: Generalized weakness    Lower Extremity Assessment Lower Extremity Assessment: Generalized weakness    Cervical / Trunk Assessment Cervical / Trunk Assessment: Kyphotic  Communication   Communication: HOH  Cognition Arousal/Alertness: Awake/alert Behavior During Therapy: WFL for tasks assessed/performed Overall Cognitive Status: Within Functional Limits for tasks assessed                                 General Comments: very appropriate for age, noted mild forgetfulness        General Comments General comments (skin integrity, edema, etc.): HR up to 148bpm max during amb, mild SOB, aware pt with afib, RN also made aware. SpO2 >92% on RA,    Exercises     Assessment/Plan    PT Assessment Patient needs continued PT services  PT Problem List Decreased strength;Decreased range of motion;Decreased activity tolerance;Decreased balance;Decreased mobility;Decreased coordination       PT Treatment Interventions Gait training;DME instruction;Functional mobility training;Therapeutic activities;Therapeutic exercise;Balance training;Neuromuscular re-education    PT Goals (Current goals can be found in the Care Plan section)  Acute Rehab PT Goals Patient Stated Goal: home today PT Goal Formulation: With patient Time For Goal Achievement: 01/03/23 Potential to Achieve Goals: Good    Frequency Min 2X/week     Co-evaluation               AM-PAC PT "6 Clicks" Mobility  Outcome Measure Help needed turning from your back to your side while in a flat bed without using bedrails?: None Help needed moving from lying on  your back to sitting on the side of a flat bed without using bedrails?: None Help needed moving to and from a bed to a chair (including a wheelchair)?: A Little Help needed standing up from a chair using your arms (e.g., wheelchair or bedside chair)?: A Little Help needed to walk in hospital room?: A Little Help needed climbing 3-5 steps with a railing? : A Little 6 Click Score: 20    End of Session Equipment Utilized During Treatment: Gait belt Activity Tolerance: Patient tolerated treatment well Patient left: in chair;with call bell/phone within reach;with chair alarm set;with nursing/sitter in room;with family/visitor present Nurse Communication: Mobility status (HR and SPo2) PT Visit Diagnosis: Unsteadiness on feet (R26.81);Repeated falls (R29.6);Difficulty in walking, not elsewhere classified (R26.2)    Time: 0912-0928 PT Time Calculation (min) (ACUTE ONLY): 16 min   Charges:   PT Evaluation $PT Eval Moderate Complexity: 1 Mod          Lewis Shock, PT, DPT Acute Rehabilitation Services Secure chat preferred Office #: [phone]   [name] 12/20/2022, 11:15 AM

## 2022-12-20 NOTE — Evaluation (Signed)
Occupational Therapy Evaluation Patient Details Name: Christine Middleton MRN: [id number] DOB: [birth year] Today's Date: 12/20/2022   History of Present Illness 89 y.o. female 89 y.o. female who presented from home due to persistent rectal bleeding on 5/21. PMH: paroxysmal A-fib on Eliquis, chronic HFpEF 60-65%, history of recurrent rectal bleed, history of internal and external hemorrhoids, diverticulosis in the distal rectum and the rectosigmoid colon and in the sigmoid colon, findings seen on colonoscopy done in 2020 with GI recommendation at that time for referral to colorectal surgery to discuss options for hemorrhoid therapy if issues persist, GERD, essential hypertension, history of CVA, chronic anxiety/depression,   Clinical Impression   PTA, pt lived with son who assisted with community IADL and home making tasks. Upon eval, pt presents with decr balance, activity tolerance, respiratory status. Pt observed with shallow breathing during activity with RR up to 37 at times; very receptive to deep and pursed lip breathing strategies but poor self maintenance after initial education requiring cues. Pt performing oral care and toileting during session. Engaged in use of IS and encouraged to use 2x/hour while in acute. Will continue to follow to optimize return to ADL but do not suspect need for follow up therapy after discharge.      Recommendations for follow up therapy are one component of a multi-disciplinary discharge planning process, led by the attending physician.  Recommendations may be updated based on patient status, additional functional criteria and insurance authorization.   Assistance Recommended at Discharge Intermittent Supervision/Assistance  Patient can return home with the following A little help with walking and/or transfers;A little help with bathing/dressing/bathroom;Assist for transportation;Help with stairs or ramp for entrance    Functional Status Assessment  Patient has had a  recent decline in their functional status and demonstrates the ability to make significant improvements in function in a reasonable and predictable amount of time.  Equipment Recommendations  None recommended by OT    Recommendations for Other Services       Precautions / Restrictions Precautions Precautions: Fall Precaution Comments: active rectal bleeding Restrictions Weight Bearing Restrictions: No      Mobility Bed Mobility Overal bed mobility: Modified Independent             General bed mobility comments: HOB elevated, increased time    Transfers Overall transfer level: Needs assistance Equipment used: None Transfers: Sit to/from Stand Sit to Stand: Min guard           General transfer comment: min guard for safety, denies dizziness      Balance Overall balance assessment: Mild deficits observed, not formally tested                                         ADL either performed or assessed with clinical judgement   ADL Overall ADL's : Needs assistance/impaired Eating/Feeding: Independent;Sitting   Grooming: Oral care;Wash/dry hands;Supervision/safety;Standing   Upper Body Bathing: Set up;Sitting   Lower Body Bathing: Min guard;Sit to/from stand   Upper Body Dressing : Set up;Sitting   Lower Body Dressing: Min guard;Sit to/from stand   Toilet Transfer: Min guard;Ambulation   Toileting- Clothing Manipulation and Hygiene: Supervision/safety;Sitting/lateral lean       Functional mobility during ADLs: Min guard General ADL Comments: HR up to 120s; and RR up to 37 at max; coached in deep breathing and use of IS to promote deeper less shallow breaths  Vision Baseline Vision/History: 1 Wears glasses Ability to See in Adequate Light: 0 Adequate Patient Visual Report: No change from baseline Vision Assessment?: No apparent visual deficits Additional Comments: WFL for tasks assessed     Perception     Praxis      Pertinent  Vitals/Pain Pain Assessment Pain Assessment: No/denies pain     Hand Dominance Right   Extremity/Trunk Assessment Upper Extremity Assessment Upper Extremity Assessment: Generalized weakness   Lower Extremity Assessment Lower Extremity Assessment: Generalized weakness   Cervical / Trunk Assessment Cervical / Trunk Assessment: Kyphotic   Communication Communication Communication: HOH   Cognition Arousal/Alertness: Awake/alert Behavior During Therapy: WFL for tasks assessed/performed Overall Cognitive Status: Within Functional Limits for tasks assessed                                 General Comments: very appropriate for age, noted mild forgetfulness     General Comments       Exercises Exercises: Other exercises Other Exercises Other Exercises: IS 5x with pt reaching up to 1000   Shoulder Instructions      Home Living Family/patient expects to be discharged to:: Private residence Living Arrangements: Children Available Help at Discharge: Family;Available 24 hours/day Type of Home: House Home Access: Ramped entrance     Home Layout: Multi-level;Able to live on main level with bedroom/bathroom     Bathroom Shower/Tub: Chief Strategy Officer: Standard     Home Equipment: Agricultural consultant (2 wheels);Rollator (4 wheels);BSC/3in1;Cane - single point;Tub bench   Additional Comments: pt lives with son who works part time but has other children that come over daily      Prior Functioning/Environment Prior Level of Function : Independent/Modified Independent             Mobility Comments: uses SPC when going outside, doesn't drive ADLs Comments: indep, pt reports she helps with some cooking and household chores        OT Problem List: Decreased strength;Decreased activity tolerance;Impaired balance (sitting and/or standing)      OT Treatment/Interventions: Self-care/ADL training;Therapeutic exercise;DME and/or AE  instruction;Balance training;Patient/family education;Therapeutic activities    OT Goals(Current goals can be found in the care plan section) Acute Rehab OT Goals Patient Stated Goal: go home OT Goal Formulation: With patient Time For Goal Achievement: 01/03/23 Potential to Achieve Goals: Good  OT Frequency: Min 2X/week    Co-evaluation              AM-PAC OT "6 Clicks" Daily Activity     Outcome Measure Help from another person eating meals?: None Help from another person taking care of personal grooming?: A Little Help from another person toileting, which includes using toliet, bedpan, or urinal?: A Little Help from another person bathing (including washing, rinsing, drying)?: A Little Help from another person to put on and taking off regular upper body clothing?: A Little Help from another person to put on and taking off regular lower body clothing?: A Little 6 Click Score: 19   End of Session Equipment Utilized During Treatment: Gait belt Nurse Communication: Mobility status  Activity Tolerance: Patient tolerated treatment well Patient left: in bed;with call bell/phone within reach;with bed alarm set  OT Visit Diagnosis: Unsteadiness on feet (R26.81);Muscle weakness (generalized) (M62.81)                Time: 4696-2952 OT Time Calculation (min): 30 min Charges:  OT General Charges $OT  Visit: 1 Visit OT Evaluation $OT Eval Low Complexity: 1 Low OT Treatments $Self Care/Home Management : 8-22 mins  Tyler Deis, OTR/L [facility] Acute Rehabilitation Office: [phone]   Myrla Halsted 12/20/2022, 5:24 PM

## 2022-12-20 NOTE — TOC CM/SW Note (Signed)
Transition of Care [facility]) - Inpatient Brief Assessment   Patient Details  Name: Christine Middleton MRN: [id number] Date of Birth: 17-Jul-1923  [facility] CM/SW Contact:    Gordy Clement, RN Phone Number: 12/20/2022, 1:02 PM   Clinical Narrative:  Patient came from home. No PT OT recommendations for therapy or any DME. Will DC to home  Anticipate no tOC needs    Transition of Care Asessment: Insurance and Status: Insurance coverage has been reviewed Patient has primary care physician: Yes Home environment has been reviewed: Safe to dc to home when medically stable Prior level of function:: Independent Prior/Current Home Services: No current home services Social Determinants of Health Reivew: SDOH reviewed no interventions necessary Readmission risk has been reviewed: Yes (13%) Transition of care needs: no transition of care needs at this time

## 2022-12-20 NOTE — Consult Note (Signed)
Consultation  Referring Provider:  [facility]  Primary Care Physician:  Nelwyn Salisbury, MD Primary Gastroenterologist:  Dr. Meridee Score       Reason for Consultation:    Rectal bleeding  LOS: 1 day          HPI:   Leslieanne Volner Klapper is a 89 y.o. female with past medical history significant for paroxysmal A-fib on Eliquis, chronic HFpEF, history of recurrent rectal bleeding with history of internal and external hemorrhoids.  Diverticulosis, presents for evaluation of rectal bleeding.  Patient has history of rectal bleeding and hemorrhoids.  Colonoscopy with Dr. Irish Lack Roddy 07/2019 showed hemorrhoids (grade 3), diverticulosis in distal rectum/rectosigmoid/sigmoid colon.  5 mm polyp.  Mucosal wrent in the distal rectum, clips placed.  At that time referral was placed to colorectal surgery to discuss options for hemorrhoid therapy if issues persisted.  Patient reports she has had banding sometime in the past.  Patient states she has had intermittent rectal bleeding multiple times a week ongoing for many years.  Struggles with constipation and takes MiraLAX daily.  States typically she will have about 2 episodes of bleeding a day about every other day.  More recently, she states over the last 4 days she has had an increased amount of bright red blood in the toilet and on tissue paper.  States typically her bleeding will stop after a day or 2, however, this time it persisted which prompted her to come to the ED.  Has not had any further bleeding since yesterday 5/21 around 3 PM.  But also has not felt the need to have a bowel movement since then.  Denies abdominal pain.  Denies weight loss, nausea, vomiting.  CTA was negative for active bleeding, showed diverticulosis in sigmoid colon.  Per EDP physical exam showed ruptured external hemorrhoid (nonbleeding).  Undergarment with significant amount of bleeding that was changed 45 minutes prior to exam.  Hgb one month ago was 12.1.  Upon arrival Hgb was  8.7.  And now 10.4 s/p 1 unit PRBCs.  BUN 16, creatinine 1.21, GFR 41.  INR 1.1 patient is on Eliquis and feels as though her last dose was yesterday morning (5/21 AM)   Past Medical History:  Diagnosis Date   Diverticulitis    Esophageal stricture    GERD (gastroesophageal reflux disease)    Hyperlipidemia    Hypertension    Shingles    Stroke (HCC)    Vitamin B 12 deficiency     Surgical History:  She  has a past surgical history that includes Appendectomy; Abdominal hysterectomy; Eye surgery; Tonsillectomy; Colonoscopy (12-09-08); Esophagogastroduodenoscopy (09-2004); Colonoscopy with propofol (N/A, 07/10/2019); polypectomy (07/10/2019); and Hemostasis clip placement (07/10/2019). Family History:  Her family history includes Breast cancer in an other family member; Cancer in her brother and brother; Diabetes in her brother and another family member; Heart attack in her mother; Heart disease in her father and another family member; Hypertension in an other family member; Stroke in her sister and another family member. Social History:   reports that she has never smoked. She has never used smokeless tobacco. She reports that she does not drink alcohol and does not use drugs.  Prior to Admission medications   Medication Sig Start Date End Date Taking? Authorizing Provider  Acetaminophen (TYLENOL 8 HOUR PO) Take by mouth every 8 (eight) hours as needed.    [provider]  apixaban (ELIQUIS) 2.5 MG TABS tablet Take 1 tablet (2.5 mg total)  by mouth 2 (two) times daily. 11/14/22   Nelwyn Salisbury, MD  atenolol (TENORMIN) 50 MG tablet TAKE 1 TABLET BY MOUTH EVERY DAY 08/08/22   Nelwyn Salisbury, MD  docusate sodium (COLACE) 100 MG capsule Take 100 mg by mouth 2 (two) times daily.    [provider]  losartan (COZAAR) 25 MG tablet Take 1 tablet (25 mg total) by mouth every evening. 11/14/22   Nelwyn Salisbury, MD  mirtazapine (REMERON) 15 MG tablet Take 1/2 (7.5 mg) tablet by mouth at  bedtime 11/14/22   Nelwyn Salisbury, MD  pantoprazole (PROTONIX) 40 MG tablet TAKE 1 TABLET BY MOUTH EVERY DAY 11/03/22   Nelwyn Salisbury, MD  polyethylene glycol (MIRALAX / GLYCOLAX) 17 g packet Take 17 g by mouth daily. Patient taking differently: Take 17 g by mouth as needed. 07/12/19   Jae Dire, MD    Current Facility-Administered Medications  Medication Dose Route Frequency Provider Last Rate Last Admin   acetaminophen (TYLENOL) tablet 500 mg  500 mg Oral Q6H PRN Dow Adolph N, DO       dextrose 5 % and 0.45 % NaCl with KCl 10 mEq/L infusion   Intravenous Continuous Leroy Sea, MD 50 mL/hr at 12/20/22 0932 New Bag at 12/20/22 0932   docusate sodium (COLACE) capsule 100 mg  100 mg Oral BID Leroy Sea, MD   100 mg at 12/20/22 0928   metoprolol tartrate (LOPRESSOR) tablet 50 mg  50 mg Oral BID Leroy Sea, MD   50 mg at 12/20/22 1610   mirtazapine (REMERON) tablet 7.5 mg  7.5 mg Oral QHS Dow Adolph N, DO   7.5 mg at 12/19/22 2312   pantoprazole (PROTONIX) injection 40 mg  40 mg Intravenous BID Dow Adolph N, DO   40 mg at 12/20/22 9604   polyethylene glycol (MIRALAX / GLYCOLAX) packet 17 g  17 g Oral Daily PRN Leroy Sea, MD       prochlorperazine (COMPAZINE) injection 5 mg  5 mg Intravenous Q6H PRN Dow Adolph N, DO        Allergies as of 12/19/2022 - Reviewed 12/19/2022  Allergen Reaction Noted   Amoxicillin  06/24/2008   Codeine  10/14/2014   Lipitor [atorvastatin]  10/18/2016   Lisinopril  03/19/2007   Nitrofurantoin  03/19/2007   Tramadol Anxiety 10/14/2020    Review of Systems  Constitutional:  Negative for chills, fever and weight loss.  HENT:  Negative for hearing loss and tinnitus.   Eyes:  Negative for blurred vision and double vision.  Respiratory:  Negative for cough, hemoptysis and shortness of breath.   Cardiovascular:  Negative for chest pain and palpitations.  Gastrointestinal:  Positive for blood in stool. Negative for abdominal  pain, constipation, diarrhea, heartburn, melena, nausea and vomiting.  Genitourinary:  Negative for dysuria and urgency.  Musculoskeletal:  Negative for myalgias and neck pain.  Skin:  Negative for itching and rash.  Neurological:  Negative for seizures and loss of consciousness.  Psychiatric/Behavioral:  Negative for depression and suicidal ideas.        Physical Exam:  Vital signs in last 24 hours: Temp:  [97.8 F (36.6 C)-98.7 F (37.1 C)] 97.8 F (36.6 C) (05/22 0800) Pulse Rate:  [41-113] 96 (05/22 0800) Resp:  [18-27] 18 (05/22 0800) BP: (127-161)/(65-95) 146/84 (05/22 0800) SpO2:  [93 %-99 %] 97 % (05/22 0800) Last BM Date : 12/19/22 Last BM recorded by nurses in past 5 days No  data recorded  Physical Exam Constitutional:      Appearance: She is not ill-appearing.     Comments: Frail elderly female  HENT:     Nose: Nose normal.     Mouth/Throat:     Mouth: Mucous membranes are moist.     Pharynx: Oropharynx is clear.  Eyes:     Extraocular Movements: Extraocular movements intact.     Conjunctiva/sclera: Conjunctivae normal.  Cardiovascular:     Rate and Rhythm: Normal rate. Rhythm irregular.  Pulmonary:     Effort: Pulmonary effort is normal. No respiratory distress.     Breath sounds: Normal breath sounds.  Abdominal:     General: Abdomen is flat. Bowel sounds are normal. There is no distension.     Palpations: Abdomen is soft. There is no mass.     Tenderness: There is no abdominal tenderness. There is no guarding or rebound.     Hernia: No hernia is present.  Musculoskeletal:        General: No swelling. Normal range of motion.     Cervical back: Normal range of motion and neck supple.  Skin:    General: Skin is warm and dry.     Coloration: Skin is not jaundiced.  Neurological:     General: No focal deficit present.     Mental Status: She is oriented to person, place, and time.  Psychiatric:        Mood and Affect: Mood normal.        Behavior:  Behavior normal.        Thought Content: Thought content normal.        Judgment: Judgment normal.      LAB RESULTS: Recent Labs    12/19/22 1922 12/19/22 2254 12/20/22 0403  WBC 6.5  --   --   HGB 8.7* 9.8* 10.4*  HCT 27.3* 30.7* 31.6*  PLT 233  --   --    BMET Recent Labs    12/19/22 1922  NA 138  K 4.2  CL 105  CO2 24  GLUCOSE 118*  BUN 16  CREATININE 1.21*  CALCIUM 8.8*   LFT Recent Labs    12/19/22 1922  PROT 6.4*  ALBUMIN 3.3*  AST 19  ALT 15  ALKPHOS 95  BILITOT 0.8   PT/INR Recent Labs    12/19/22 1922  LABPROT 14.9  INR 1.1    STUDIES: CT ANGIO GI BLEED  Result Date: 12/19/2022 CLINICAL DATA:  Lower GI bleed. EXAM: CTA ABDOMEN AND PELVIS WITHOUT AND WITH CONTRAST TECHNIQUE: Multidetector CT imaging of the abdomen and pelvis was performed using the standard protocol during bolus administration of intravenous contrast. Multiplanar reconstructed images and MIPs were obtained and reviewed to evaluate the vascular anatomy. RADIATION DOSE REDUCTION: This exam was performed according to the departmental dose-optimization program which includes automated exposure control, adjustment of the mA and/or kV according to patient size and/or use of iterative reconstruction technique. CONTRAST:  75mL OMNIPAQUE IOHEXOL 350 MG/ML SOLN COMPARISON:  CT abdomen pelvis dated 06/17/2022. FINDINGS: VASCULAR Aorta: Moderate aortoiliac atherosclerotic disease. No aneurysmal dilatation or dissection. No periaortic fluid collection. Celiac: Atherosclerotic calcification of the origin of the celiac artery. The celiac trunk and its major branches appear patent. SMA: Atherosclerotic calcification of the origin of the SMA. The SMA is patent. Renals: Atherosclerotic calcification of the origins of the renal arteries. The renal arteries are patent. IMA: The IMA is patent. Inflow: Mild atherosclerotic calcification of the iliac arteries. The iliac arteries are  patent. No aneurysmal  dilatation or dissection. Proximal Outflow: Bilateral common femoral and visualized portions of the superficial and profunda femoral arteries are patent without evidence of aneurysm, dissection, vasculitis or significant stenosis. Veins: The IVC is unremarkable.  No portal venous gas. Review of the MIP images confirms the above findings. NON-VASCULAR Lower chest: Partially visualized small right pleural effusion with partial compressive atelectasis of the right lower lobe. Pneumonia is not excluded clinical correlation is recommended. Hepatobiliary: Indeterminate 2 cm hypodense lesion with calcification in the right lobe of the liver, similar to prior CT. The liver is otherwise unremarkable. No biliary dilatation. The gallbladder is unremarkable. Pancreas: Unremarkable. No pancreatic ductal dilatation or surrounding inflammatory changes. Spleen: Normal in size without focal abnormality. Adrenals/Urinary Tract: The adrenal glands are unremarkable. There is no hydronephrosis or nephrolithiasis on either side. There is symmetric enhancement and excretion of contrast by both kidneys. The visualized ureters and urinary bladder appear unremarkable. Stomach/Bowel: There is sigmoid diverticulosis without active inflammatory changes. There is no bowel obstruction or active inflammation. No evidence of active GI bleed. Appendectomy. Lymphatic: No adenopathy. Reproductive: Hysterectomy.  No adnexal masses. Other: Small fat containing umbilical hernia. Musculoskeletal: Osteopenia with degenerative changes of the spine. No acute osseous pathology. IMPRESSION: 1. No acute intra-abdominal or pelvic pathology. No evidence of active GI bleed. 2. Sigmoid diverticulosis without active inflammatory changes. 3. Partially visualized small right pleural effusion with partial compressive atelectasis of the right lower lobe. Pneumonia is not excluded clinical correlation is recommended. Electronically Signed   By: Elgie Collard M.D.    On: 12/19/2022 21:01      Impression    Rectal bleeding -Colonoscopy 2020 with history of diverticulosis and hemorrhoids -Hgb 10.4 -BUN 16, creatinine 1.21, GFR 41 -CTA negative for active bleeding, shows diverticulosis Suspicious for diverticular bleed versus hemorrhoidal bleed.  Extensive history of intermittent rectal bleeding for many years, acutely worsened.  EDP PE showed ruptured hemorrhoid (though no active bleeding). CTA was negative.  Currently no further bleeding.  With age and comorbidities and recent colonoscopy, would prefer to avoid procedures in this patient if possible  A-fib on Eliquis -Last dose 5/21 AM   Plan   -If recurrent large-volume hematochezia in a short time span, recommend repeat CTA.  If positive recommend IR embolization -Continue to hold Eliquis -Continue close monitoring -Consider consulting surgical team if recurrent bleeding as well for evaluation of hemorrhoids refractory to banding -Consider trial of hydrocortisone for hemorrhoids -Continue daily CBC and transfuse as needed to maintain HGB > 7  -Can continue home MiraLAX to prevent worsening constipation  Thank you for your kind consultation, we will continue to follow.   Emarie Paul Leanna Sato  12/20/2022, 10:24 AM

## 2022-12-20 NOTE — Progress Notes (Signed)
PROGRESS NOTE                                                                                                                                                                                                             Patient Demographics:    Christine Middleton, is a 89 y.o. female, DOB - 1923/06/04, ZOX:[id number]  Outpatient Primary MD for the patient is Nelwyn Salisbury, MD    LOS - 1  Admit date - 12/19/2022    Chief Complaint  Patient presents with   GI Bleed    Bloody Stools       Brief Narrative (HPI from H&P)   89 y.o. female with medical history significant for paroxysmal A-fib on Eliquis, chronic HFpEF 60-65%, history of recurrent rectal bleed, history of internal and external hemorrhoids, diverticulosis in the distal rectum and the rectosigmoid colon and in the sigmoid colon, findings seen on colonoscopy done in 2020 with GI recommendation at that time for referral to colorectal surgery to discuss options for hemorrhoid therapy if issues persist, GERD, essential hypertension, history of CVA, chronic anxiety/depression, who presented to [facility] ED from home due to persistent rectal bleeding.  CT scan of the abdomen pelvis in the ER suggested some diverticulosis.  GI was consulted and she was admitted to the hospital for blood per rectum workup.   Subjective:    Christine Middleton today has, No headache, No chest pain, No abdominal pain - No Nausea, No new weakness tingling or numbness, no SOB, still minimal amounts of blood in her stool   Assessment  & Plan :   Acute lower GI bleed.  Painless in the setting of patient being on Eliquis.  Suspicious for diverticular bleed, CT scan shows diverticulosis, on bowel rest, IV fluids, Eliquis on hold, monitor CBC, transfuse as needed goal hemoglobin around 8, patient agreeable for transfusion if needed.  GI on board.  GERD.  PPI.   Paroxysmal atrial fibrillation.  [place] vasc 2 score for  greater than 3.  Eliquis on hold, low-dose beta-blocker added.  Essential hypertension.  Low-dose beta-blocker.  Chronic diastolic dysfunction EF 60%.  Currently compensated.  Low-dose beta-blocker.    Weakness, deconditioning, mild protein calorie malnutrition.  PT, OT, protein supplements once taking oral diet.  CKD 3A.  Creatinine close to baseline.  Monitor.  Chronic anxiety and depression.  Remeron.      Condition - Extremely Guarded  Family Communication  :    Daughter [phone]  - 12/20/22 message left at 8.53 am  Son Ramon Dredge [phone] updated 12/20/22   Code Status :  Full  Consults  :  GI  PUD Prophylaxis : PPI   Procedures  :     CTA - 1. No acute intra-abdominal or pelvic pathology. No evidence of active GI bleed. 2. Sigmoid diverticulosis without active inflammatory changes. 3. Partially visualized small right pleural effusion with partial compressive atelectasis of the right lower lobe. Pneumonia is not excluded clinical correlation is recommended.      Disposition Plan  :    Status is: Inpatient  DVT Prophylaxis  :    SCDs Start: 12/19/22 2202   Lab Results  Component Value Date   PLT 233 12/19/2022    Diet :  Diet Order             Diet NPO time specified Except for: Sips with Meds  Diet effective now                    Inpatient Medications  Scheduled Meds:  docusate sodium  100 mg Oral BID   mirtazapine  7.5 mg Oral QHS   pantoprazole (PROTONIX) IV  40 mg Intravenous BID   Continuous Infusions:  dextrose 5 % and 0.45 % NaCl with KCl 10 mEq/L     PRN Meds:.acetaminophen, polyethylene glycol, prochlorperazine  Antibiotics  :    Anti-infectives (From admission, onward)    None         Objective:   Vitals:   12/19/22 2145 12/19/22 2200 12/19/22 2305 12/20/22 0759  BP: (!) 150/73 (!) 149/65 (!) 156/73   Pulse: (!) 41 61 77   Resp: (!) 21 (!) 27 18   Temp:   97.9 F (36.6 C)   TempSrc:   Oral Oral  SpO2: 97%  93% 95%     Wt Readings from Last 3 Encounters:  11/14/22 59.6 kg  08/29/22 61.7 kg  06/28/22 63.5 kg    No intake or output data in the 24 hours ending 12/20/22 0854   Physical Exam  Awake Alert, No new F.N deficits, Normal affect [place].AT,PERRAL Supple Neck, No JVD,   Symmetrical Chest wall movement, Good air movement bilaterally, CTAB RRR,No Gallops,Rubs or new Murmurs,  +ve B.Sounds, Abd Soft, No tenderness,   No Cyanosis, Clubbing or edema     RN pressure injury documentation:      Data Review:    Recent Labs  Lab 12/19/22 1922 12/19/22 2254 12/20/22 0403  WBC 6.5  --   --   HGB 8.7* 9.8* 10.4*  HCT 27.3* 30.7* 31.6*  PLT 233  --   --   MCV 90.7  --   --   MCH 28.9  --   --   MCHC 31.9  --   --   RDW 13.5  --   --   LYMPHSABS 0.8  --   --   MONOABS 0.7  --   --   EOSABS 0.3  --   --   BASOSABS 0.1  --   --     Recent Labs  Lab 12/19/22 1922  NA 138  K 4.2  CL 105  CO2 24  ANIONGAP 9  GLUCOSE 118*  BUN 16  CREATININE 1.21*  AST 19  ALT 15  ALKPHOS 95  BILITOT 0.8  ALBUMIN 3.3*  INR  1.1  CALCIUM 8.8*      Recent Labs  Lab 12/19/22 1922  INR 1.1  CALCIUM 8.8*    Recent Labs  Lab 12/19/22 1922  WBC 6.5  PLT 233  CREATININE 1.21*    ------------------------------------------------------------------------------------------------------------------ Lab Results  Component Value Date   CHOL 218 (H) 11/14/2022   HDL 77.70 11/14/2022   LDLCALC 117 (H) 11/14/2022   LDLDIRECT 113.3 07/18/2012   TRIG 118.0 11/14/2022   CHOLHDL 3 11/14/2022    Lab Results  Component Value Date   HGBA1C 5.9 11/14/2022    No results for input(s): "TSH", "T4TOTAL", "T3FREE", "THYROIDAB" in the last 72 hours.  Invalid input(s): "FREET3" ------------------------------------------------------------------------------------------------------------------ Cardiac Enzymes No results for input(s): "CKMB", "TROPONINI", "MYOGLOBIN" in the last 168  hours.  Invalid input(s): "CK"  Micro Results No results found for this or any previous visit (from the past 240 hour(s)).  Radiology Reports CT ANGIO GI BLEED  Result Date: 12/19/2022 CLINICAL DATA:  Lower GI bleed. EXAM: CTA ABDOMEN AND PELVIS WITHOUT AND WITH CONTRAST TECHNIQUE: Multidetector CT imaging of the abdomen and pelvis was performed using the standard protocol during bolus administration of intravenous contrast. Multiplanar reconstructed images and MIPs were obtained and reviewed to evaluate the vascular anatomy. RADIATION DOSE REDUCTION: This exam was performed according to the departmental dose-optimization program which includes automated exposure control, adjustment of the mA and/or kV according to patient size and/or use of iterative reconstruction technique. CONTRAST:  75mL OMNIPAQUE IOHEXOL 350 MG/ML SOLN COMPARISON:  CT abdomen pelvis dated 06/17/2022. FINDINGS: VASCULAR Aorta: Moderate aortoiliac atherosclerotic disease. No aneurysmal dilatation or dissection. No periaortic fluid collection. Celiac: Atherosclerotic calcification of the origin of the celiac artery. The celiac trunk and its major branches appear patent. SMA: Atherosclerotic calcification of the origin of the SMA. The SMA is patent. Renals: Atherosclerotic calcification of the origins of the renal arteries. The renal arteries are patent. IMA: The IMA is patent. Inflow: Mild atherosclerotic calcification of the iliac arteries. The iliac arteries are patent. No aneurysmal dilatation or dissection. Proximal Outflow: Bilateral common femoral and visualized portions of the superficial and profunda femoral arteries are patent without evidence of aneurysm, dissection, vasculitis or significant stenosis. Veins: The IVC is unremarkable.  No portal venous gas. Review of the MIP images confirms the above findings. NON-VASCULAR Lower chest: Partially visualized small right pleural effusion with partial compressive atelectasis of the  right lower lobe. Pneumonia is not excluded clinical correlation is recommended. Hepatobiliary: Indeterminate 2 cm hypodense lesion with calcification in the right lobe of the liver, similar to prior CT. The liver is otherwise unremarkable. No biliary dilatation. The gallbladder is unremarkable. Pancreas: Unremarkable. No pancreatic ductal dilatation or surrounding inflammatory changes. Spleen: Normal in size without focal abnormality. Adrenals/Urinary Tract: The adrenal glands are unremarkable. There is no hydronephrosis or nephrolithiasis on either side. There is symmetric enhancement and excretion of contrast by both kidneys. The visualized ureters and urinary bladder appear unremarkable. Stomach/Bowel: There is sigmoid diverticulosis without active inflammatory changes. There is no bowel obstruction or active inflammation. No evidence of active GI bleed. Appendectomy. Lymphatic: No adenopathy. Reproductive: Hysterectomy.  No adnexal masses. Other: Small fat containing umbilical hernia. Musculoskeletal: Osteopenia with degenerative changes of the spine. No acute osseous pathology. IMPRESSION: 1. No acute intra-abdominal or pelvic pathology. No evidence of active GI bleed. 2. Sigmoid diverticulosis without active inflammatory changes. 3. Partially visualized small right pleural effusion with partial compressive atelectasis of the right lower lobe. Pneumonia is not excluded clinical correlation is recommended. Electronically  Signed   By: Elgie Collard M.D.   On: 12/19/2022 21:01      Signature  -   Susa Raring M.D on 12/20/2022 at 8:54 AM   -  To page go to [email or web address]

## 2022-12-21 DIAGNOSIS — K648 Other hemorrhoids: Secondary | ICD-10-CM

## 2022-12-21 DIAGNOSIS — Z7901 Long term (current) use of anticoagulants: Secondary | ICD-10-CM | POA: Diagnosis not present

## 2022-12-21 DIAGNOSIS — D649 Anemia, unspecified: Secondary | ICD-10-CM | POA: Diagnosis not present

## 2022-12-21 DIAGNOSIS — K625 Hemorrhage of anus and rectum: Secondary | ICD-10-CM | POA: Diagnosis not present

## 2022-12-21 LAB — CBC WITH DIFFERENTIAL/PLATELET
Abs Immature Granulocytes: 0.03 10*3/uL (ref 0.00–0.07)
Basophils Absolute: 0.1 10*3/uL (ref 0.0–0.1)
Basophils Relative: 1 %
Eosinophils Absolute: 0 10*3/uL (ref 0.0–0.5)
Eosinophils Relative: 0 %
HCT: 30.2 % — ABNORMAL LOW (ref 36.0–46.0)
Hemoglobin: 10 g/dL — ABNORMAL LOW (ref 12.0–15.0)
Immature Granulocytes: 0 %
Lymphocytes Relative: 8 %
Lymphs Abs: 0.7 10*3/uL (ref 0.7–4.0)
MCH: 28.9 pg (ref 26.0–34.0)
MCHC: 33.1 g/dL (ref 30.0–36.0)
MCV: 87.3 fL (ref 80.0–100.0)
Monocytes Absolute: 0.7 10*3/uL (ref 0.1–1.0)
Monocytes Relative: 9 %
Neutro Abs: 7.1 10*3/uL (ref 1.7–7.7)
Neutrophils Relative %: 82 %
Platelets: 200 10*3/uL (ref 150–400)
RBC: 3.46 MIL/uL — ABNORMAL LOW (ref 3.87–5.11)
RDW: 13.5 % (ref 11.5–15.5)
WBC: 8.6 10*3/uL (ref 4.0–10.5)
nRBC: 0 % (ref 0.0–0.2)

## 2022-12-21 LAB — BASIC METABOLIC PANEL
Anion gap: 10 (ref 5–15)
BUN: 14 mg/dL (ref 8–23)
CO2: 22 mmol/L (ref 22–32)
Calcium: 8.6 mg/dL — ABNORMAL LOW (ref 8.9–10.3)
Chloride: 107 mmol/L (ref 98–111)
Creatinine, Ser: 1.16 mg/dL — ABNORMAL HIGH (ref 0.44–1.00)
GFR, Estimated: 43 mL/min — ABNORMAL LOW (ref >60)
Glucose, Bld: 136 mg/dL — ABNORMAL HIGH (ref 70–99)
Potassium: 4.1 mmol/L (ref 3.5–5.1)
Sodium: 139 mmol/L (ref 135–145)

## 2022-12-21 LAB — MAGNESIUM: Magnesium: 1.9 mg/dL (ref 1.7–2.4)

## 2022-12-21 LAB — BRAIN NATRIURETIC PEPTIDE: B Natriuretic Peptide: 295.1 pg/mL — ABNORMAL HIGH (ref 0.0–100.0)

## 2022-12-21 MED ORDER — DOCUSATE SODIUM 100 MG PO CAPS
200.0000 mg | ORAL_CAPSULE | Freq: Two times a day (BID) | ORAL | Status: DC
  Administered 2022-12-21 – 2022-12-23 (×5): 200 mg via ORAL
  Filled 2022-12-21 (×5): qty 2

## 2022-12-21 MED ORDER — POLYETHYLENE GLYCOL 3350 17 G PO PACK
17.0000 g | PACK | Freq: Every day | ORAL | Status: DC
  Administered 2022-12-21 – 2022-12-23 (×3): 17 g via ORAL
  Filled 2022-12-21 (×2): qty 1

## 2022-12-21 NOTE — Progress Notes (Signed)
Mobility Specialist Progress Note   12/21/22 1053  Mobility  Activity Ambulated with assistance in hallway  Level of Assistance Contact guard assist, steadying assist  Assistive Device Other (Comment) (HHA)  Distance Ambulated (ft) 370 ft  Activity Response Tolerated well  Mobility Referral Yes  $Mobility charge 1 Mobility  Mobility Specialist Start Time (ACUTE ONLY) 1035  Mobility Specialist Stop Time (ACUTE ONLY) 1050  Mobility Specialist Time Calculation (min) (ACUTE ONLY) 15 min   Received pt in chair w/o complaint and agreeable. Able to stand w/ supervision but CGA during  ambulation for safety, pt using railing today d/t mild fatigue. Able to finish session w/o fault, returned back to chair w/ needs met.   Frederico Hamman Mobility Specialist Please contact via SecureChat or  Rehab office at [phone]

## 2022-12-21 NOTE — Progress Notes (Signed)
Progress Note   Subjective  PAtient has not had any further bleeding since I saw her yesterday PM. Denies rectal pain. Eating okay. Hgb stable.   Objective   Vital signs in last 24 hours: Temp:  [97.7 F (36.5 C)-98.1 F (36.7 C)] 98.1 F (36.7 C) (05/22 1723) Pulse Rate:  [82] 82 (05/22 2224) BP: (98-154)/(74-81) 154/74 (05/22 2224) Weight:  [61.5 kg] 61.5 kg (05/23 0300) Last BM Date : 12/19/22 General:    white female in NAD Neurologic:  Alert and oriented,  grossly normal neurologically. Psych:  Cooperative. Normal mood and affect.  Intake/Output from previous day: No intake/output data recorded. Intake/Output this shift: No intake/output data recorded.  Lab Results: Recent Labs    12/19/22 1922 12/19/22 2254 12/20/22 0403 12/20/22 1521 12/21/22 0211  WBC 6.5  --   --   --  8.6  HGB 8.7*   < > 10.4* 9.7* 10.0*  HCT 27.3*   < > 31.6* 29.5* 30.2*  PLT 233  --   --   --  200   < > = values in this interval not displayed.   BMET Recent Labs    12/19/22 1922 12/21/22 0211  NA 138 139  K 4.2 4.1  CL 105 107  CO2 24 22  GLUCOSE 118* 136*  BUN 16 14  CREATININE 1.21* 1.16*  CALCIUM 8.8* 8.6*   LFT Recent Labs    12/19/22 1922  PROT 6.4*  ALBUMIN 3.3*  AST 19  ALT 15  ALKPHOS 95  BILITOT 0.8   PT/INR Recent Labs    12/19/22 1922  LABPROT 14.9  INR 1.1    Studies/Results: CT ANGIO GI BLEED  Result Date: 12/19/2022 CLINICAL DATA:  Lower GI bleed. EXAM: CTA ABDOMEN AND PELVIS WITHOUT AND WITH CONTRAST TECHNIQUE: Multidetector CT imaging of the abdomen and pelvis was performed using the standard protocol during bolus administration of intravenous contrast. Multiplanar reconstructed images and MIPs were obtained and reviewed to evaluate the vascular anatomy. RADIATION DOSE REDUCTION: This exam was performed according to the departmental dose-optimization program which includes automated exposure control, adjustment of the mA and/or kV  according to patient size and/or use of iterative reconstruction technique. CONTRAST:  75mL OMNIPAQUE IOHEXOL 350 MG/ML SOLN COMPARISON:  CT abdomen pelvis dated 06/17/2022. FINDINGS: VASCULAR Aorta: Moderate aortoiliac atherosclerotic disease. No aneurysmal dilatation or dissection. No periaortic fluid collection. Celiac: Atherosclerotic calcification of the origin of the celiac artery. The celiac trunk and its major branches appear patent. SMA: Atherosclerotic calcification of the origin of the SMA. The SMA is patent. Renals: Atherosclerotic calcification of the origins of the renal arteries. The renal arteries are patent. IMA: The IMA is patent. Inflow: Mild atherosclerotic calcification of the iliac arteries. The iliac arteries are patent. No aneurysmal dilatation or dissection. Proximal Outflow: Bilateral common femoral and visualized portions of the superficial and profunda femoral arteries are patent without evidence of aneurysm, dissection, vasculitis or significant stenosis. Veins: The IVC is unremarkable.  No portal venous gas. Review of the MIP images confirms the above findings. NON-VASCULAR Lower chest: Partially visualized small right pleural effusion with partial compressive atelectasis of the right lower lobe. Pneumonia is not excluded clinical correlation is recommended. Hepatobiliary: Indeterminate 2 cm hypodense lesion with calcification in the right lobe of the liver, similar to prior CT. The liver is otherwise unremarkable. No biliary dilatation. The gallbladder is unremarkable. Pancreas: Unremarkable. No pancreatic ductal dilatation or surrounding inflammatory changes. Spleen: Normal in size without focal  abnormality. Adrenals/Urinary Tract: The adrenal glands are unremarkable. There is no hydronephrosis or nephrolithiasis on either side. There is symmetric enhancement and excretion of contrast by both kidneys. The visualized ureters and urinary bladder appear unremarkable. Stomach/Bowel: There  is sigmoid diverticulosis without active inflammatory changes. There is no bowel obstruction or active inflammation. No evidence of active GI bleed. Appendectomy. Lymphatic: No adenopathy. Reproductive: Hysterectomy.  No adnexal masses. Other: Small fat containing umbilical hernia. Musculoskeletal: Osteopenia with degenerative changes of the spine. No acute osseous pathology. IMPRESSION: 1. No acute intra-abdominal or pelvic pathology. No evidence of active GI bleed. 2. Sigmoid diverticulosis without active inflammatory changes. 3. Partially visualized small right pleural effusion with partial compressive atelectasis of the right lower lobe. Pneumonia is not excluded clinical correlation is recommended. Electronically Signed   By: Elgie Collard M.D.   On: 12/19/2022 21:01       Assessment / Plan:    89 y/o female here with the following:  Rectal bleeding secondary to internal hemorrhoids Anemia Anticoagulated  Intermittent bleeding from hemorrhoids over time, recent severe worsening in the setting of Eliquis leading to hospitalization for anemia. Started on Anusol, Eliquis held, no further bleeding and Hgb stable.   Discussed options with her. Very important to avoid constipation and keep stools soft, she needs to take her Miralax daily.   Regarding hemorrhoidal management - discussed options of hemorrhoid banding to provide more definitive therapy versus continued topical therapies with Anusol or Calmol. We discussed risks / benefits of each of these. I do think hemorrhoid banding is reasonable, we discussed what that entailed and risks for bleeding 2 weeks post banding. Following discussion she really wants to avoid anything intervention and declines banding.  As such, would continue course of Anusol for one week and then use Calmol 4 suppositories PRN. Continue Miralax. Would hold Eliquis a few more days and if no further bleeding then cautiously resume. If she has significant bleeding in the  future she may re-consider hemorrhoid banding. She understands and agrees with the plan. Spoke about long term Eliquis use with her and Dr. Thedore Mins - will hold for a short course but long term if she has continued bleeding anemia and does not tolerate it will either need an intervention on her hemorrhoids or need to stop the anticoagulation.  PLAN: - offered hemorrhoid banding as inpatient, she declines as above - continue Anusol suppository course for one week - continue Miralax daily and titrate as needed  to keep stools soft - can use Calmol4 suppositories OTC as outpatient PRN - hold Eliquis a few more days and then resume  Call with questions, we will sign off for now.  Harlin Rain, MD [facility]

## 2022-12-21 NOTE — Progress Notes (Addendum)
PROGRESS NOTE                                                                                                                                                                                                             Patient Demographics:    Christine Middleton, is a 89 y.o. female, DOB - May 28, 1923, ZOX:[id number]  Outpatient Primary MD for the patient is Nelwyn Salisbury, MD    LOS - 2  Admit date - 12/19/2022    Chief Complaint  Patient presents with   GI Bleed    Bloody Stools       Brief Narrative (HPI from H&P)   89 y.o. female with medical history significant for paroxysmal A-fib on Eliquis, chronic HFpEF 60-65%, history of recurrent rectal bleed, history of internal and external hemorrhoids, diverticulosis in the distal rectum and the rectosigmoid colon and in the sigmoid colon, findings seen on colonoscopy done in 2020 with GI recommendation at that time for referral to colorectal surgery to discuss options for hemorrhoid therapy if issues persist, GERD, essential hypertension, history of CVA, chronic anxiety/depression, who presented to [facility] ED from home due to persistent rectal bleeding.  CT scan of the abdomen pelvis in the ER suggested some diverticulosis.  GI was consulted and she was admitted to the hospital for blood per rectum workup.   Subjective:   Patient in bed, appears comfortable, denies any headache, no fever, no chest pain or pressure, no shortness of breath , no abdominal pain. No focal weakness.  No further blood in stool.   Assessment  & Plan :   Acute lower GI bleed.  Painless in the setting of patient being on Eliquis.  Suspicious for diverticular versus hemorrhoidal bleed, CT scan shows diverticulosis, Eliquis on hold, hydrocortisone rectal suppository, monitor CBC which is staying stable, transfuse as needed goal hemoglobin around 8, patient agreeable for transfusion if needed.  GI on board.  If no further  bleed likely resume Eliquis in the morning and thereafter discharge home.  GERD.  PPI.   Paroxysmal atrial fibrillation.  [place] vasc 2 score for greater than 3.  Eliquis on hold, low-dose beta-blocker added.  Essential hypertension.  Low-dose beta-blocker.  Chronic diastolic dysfunction EF 60%.  Currently compensated.  Low-dose beta-blocker.    Weakness, deconditioning, mild protein calorie malnutrition.  PT, OT, protein supplements once  taking oral diet.  CKD 3A.  Creatinine close to baseline.  Monitor.  Chronic anxiety and depression.  Remeron.      Condition - Extremely Guarded  Family Communication  :    Daughter [phone]  - 12/20/22 message left at 8.53 am  Son Ramon Dredge [phone],  updated 12/20/22, 12/21/2022 message left at 7:30 AM  Son Gerlene Burdock [phone] - updated 12/21/22   Code Status :  Full  Consults  :  GI  PUD Prophylaxis : PPI   Procedures  :     CTA - 1. No acute intra-abdominal or pelvic pathology. No evidence of active GI bleed. 2. Sigmoid diverticulosis without active inflammatory changes. 3. Partially visualized small right pleural effusion with partial compressive atelectasis of the right lower lobe. Pneumonia is not excluded clinical correlation is recommended.      Disposition Plan  :    Status is: Inpatient  DVT Prophylaxis  :    SCDs Start: 12/19/22 2202   Lab Results  Component Value Date   PLT 200 12/21/2022    Diet :  Diet Order             Diet regular Room service appropriate? Yes; Fluid consistency: Thin  Diet effective now                    Inpatient Medications  Scheduled Meds:  docusate sodium  100 mg Oral BID   hydrocortisone  25 mg Rectal BID   metoprolol tartrate  50 mg Oral BID   mirtazapine  7.5 mg Oral QHS   pantoprazole (PROTONIX) IV  40 mg Intravenous BID   Continuous Infusions:  dextrose 5 % and 0.45 % NaCl with KCl 10 mEq/L 50 mL/hr at 12/20/22 0932   PRN Meds:.acetaminophen, polyethylene  glycol, prochlorperazine  Antibiotics  :    Anti-infectives (From admission, onward)    None         Objective:   Vitals:   12/20/22 1245 12/20/22 1723 12/20/22 2224 12/21/22 0300  BP: 120/81 98/80 (!) 154/74   Pulse:   82   Resp:      Temp: 97.7 F (36.5 C) 98.1 F (36.7 C)    TempSrc: Oral Oral    SpO2:      Weight:    61.5 kg    Wt Readings from Last 3 Encounters:  12/21/22 61.5 kg  11/14/22 59.6 kg  08/29/22 61.7 kg    No intake or output data in the 24 hours ending 12/21/22 0729   Physical Exam  Awake Alert, No new F.N deficits, Normal affect [place].AT,PERRAL Supple Neck, No JVD,   Symmetrical Chest wall movement, Good air movement bilaterally, CTAB RRR,No Gallops,Rubs or new Murmurs,  +ve B.Sounds, Abd Soft, No tenderness,   No Cyanosis, Clubbing or edema         Data Review:    Recent Labs  Lab 12/19/22 1922 12/19/22 2254 12/20/22 0403 12/20/22 1521 12/21/22 0211  WBC 6.5  --   --   --  8.6  HGB 8.7* 9.8* 10.4* 9.7* 10.0*  HCT 27.3* 30.7* 31.6* 29.5* 30.2*  PLT 233  --   --   --  200  MCV 90.7  --   --   --  87.3  MCH 28.9  --   --   --  28.9  MCHC 31.9  --   --   --  33.1  RDW 13.5  --   --   --  13.5  LYMPHSABS 0.8  --   --   --  0.7  MONOABS 0.7  --   --   --  0.7  EOSABS 0.3  --   --   --  0.0  BASOSABS 0.1  --   --   --  0.1    Recent Labs  Lab 12/19/22 1922 12/21/22 0211  NA 138 139  K 4.2 4.1  CL 105 107  CO2 24 22  ANIONGAP 9 10  GLUCOSE 118* 136*  BUN 16 14  CREATININE 1.21* 1.16*  AST 19  --   ALT 15  --   ALKPHOS 95  --   BILITOT 0.8  --   ALBUMIN 3.3*  --   INR 1.1  --   BNP  --  295.1*  MG  --  1.9  CALCIUM 8.8* 8.6*      Recent Labs  Lab 12/19/22 1922 12/21/22 0211  INR 1.1  --   BNP  --  295.1*  MG  --  1.9  CALCIUM 8.8* 8.6*    Recent Labs  Lab 12/19/22 1922 12/21/22 0211  WBC 6.5 8.6  PLT 233 200  CREATININE 1.21* 1.16*     ------------------------------------------------------------------------------------------------------------------ Lab Results  Component Value Date   CHOL 218 (H) 11/14/2022   HDL 77.70 11/14/2022   LDLCALC 117 (H) 11/14/2022   LDLDIRECT 113.3 07/18/2012   TRIG 118.0 11/14/2022   CHOLHDL 3 11/14/2022    Lab Results  Component Value Date   HGBA1C 5.9 11/14/2022    No results for input(s): "TSH", "T4TOTAL", "T3FREE", "THYROIDAB" in the last 72 hours.  Invalid input(s): "FREET3" ------------------------------------------------------------------------------------------------------------------ Cardiac Enzymes No results for input(s): "CKMB", "TROPONINI", "MYOGLOBIN" in the last 168 hours.  Invalid input(s): "CK"  Micro Results No results found for this or any previous visit (from the past 240 hour(s)).  Radiology Reports CT ANGIO GI BLEED  Result Date: 12/19/2022 CLINICAL DATA:  Lower GI bleed. EXAM: CTA ABDOMEN AND PELVIS WITHOUT AND WITH CONTRAST TECHNIQUE: Multidetector CT imaging of the abdomen and pelvis was performed using the standard protocol during bolus administration of intravenous contrast. Multiplanar reconstructed images and MIPs were obtained and reviewed to evaluate the vascular anatomy. RADIATION DOSE REDUCTION: This exam was performed according to the departmental dose-optimization program which includes automated exposure control, adjustment of the mA and/or kV according to patient size and/or use of iterative reconstruction technique. CONTRAST:  75mL OMNIPAQUE IOHEXOL 350 MG/ML SOLN COMPARISON:  CT abdomen pelvis dated 06/17/2022. FINDINGS: VASCULAR Aorta: Moderate aortoiliac atherosclerotic disease. No aneurysmal dilatation or dissection. No periaortic fluid collection. Celiac: Atherosclerotic calcification of the origin of the celiac artery. The celiac trunk and its major branches appear patent. SMA: Atherosclerotic calcification of the origin of the SMA. The  SMA is patent. Renals: Atherosclerotic calcification of the origins of the renal arteries. The renal arteries are patent. IMA: The IMA is patent. Inflow: Mild atherosclerotic calcification of the iliac arteries. The iliac arteries are patent. No aneurysmal dilatation or dissection. Proximal Outflow: Bilateral common femoral and visualized portions of the superficial and profunda femoral arteries are patent without evidence of aneurysm, dissection, vasculitis or significant stenosis. Veins: The IVC is unremarkable.  No portal venous gas. Review of the MIP images confirms the above findings. NON-VASCULAR Lower chest: Partially visualized small right pleural effusion with partial compressive atelectasis of the right lower lobe. Pneumonia is not excluded clinical correlation is recommended. Hepatobiliary: Indeterminate 2 cm hypodense lesion with calcification in the right lobe of the liver,  similar to prior CT. The liver is otherwise unremarkable. No biliary dilatation. The gallbladder is unremarkable. Pancreas: Unremarkable. No pancreatic ductal dilatation or surrounding inflammatory changes. Spleen: Normal in size without focal abnormality. Adrenals/Urinary Tract: The adrenal glands are unremarkable. There is no hydronephrosis or nephrolithiasis on either side. There is symmetric enhancement and excretion of contrast by both kidneys. The visualized ureters and urinary bladder appear unremarkable. Stomach/Bowel: There is sigmoid diverticulosis without active inflammatory changes. There is no bowel obstruction or active inflammation. No evidence of active GI bleed. Appendectomy. Lymphatic: No adenopathy. Reproductive: Hysterectomy.  No adnexal masses. Other: Small fat containing umbilical hernia. Musculoskeletal: Osteopenia with degenerative changes of the spine. No acute osseous pathology. IMPRESSION: 1. No acute intra-abdominal or pelvic pathology. No evidence of active GI bleed. 2. Sigmoid diverticulosis without  active inflammatory changes. 3. Partially visualized small right pleural effusion with partial compressive atelectasis of the right lower lobe. Pneumonia is not excluded clinical correlation is recommended. Electronically Signed   By: Elgie Collard M.D.   On: 12/19/2022 21:01      Signature  -   Susa Raring M.D on 12/21/2022 at 7:29 AM   -  To page go to [email or web address]

## 2022-12-22 DIAGNOSIS — K625 Hemorrhage of anus and rectum: Secondary | ICD-10-CM | POA: Diagnosis not present

## 2022-12-22 LAB — CBC
HCT: 30.8 % — ABNORMAL LOW (ref 36.0–46.0)
Hemoglobin: 10 g/dL — ABNORMAL LOW (ref 12.0–15.0)
MCH: 28.7 pg (ref 26.0–34.0)
MCHC: 32.5 g/dL (ref 30.0–36.0)
MCV: 88.3 fL (ref 80.0–100.0)
Platelets: 229 10*3/uL (ref 150–400)
RBC: 3.49 MIL/uL — ABNORMAL LOW (ref 3.87–5.11)
RDW: 13.8 % (ref 11.5–15.5)
WBC: 6.8 10*3/uL (ref 4.0–10.5)
nRBC: 0 % (ref 0.0–0.2)

## 2022-12-22 LAB — CBC WITH DIFFERENTIAL/PLATELET
Abs Immature Granulocytes: 0.02 10*3/uL (ref 0.00–0.07)
Basophils Absolute: 0.1 10*3/uL (ref 0.0–0.1)
Basophils Relative: 1 %
Eosinophils Absolute: 0 10*3/uL (ref 0.0–0.5)
Eosinophils Relative: 0 %
HCT: 27.1 % — ABNORMAL LOW (ref 36.0–46.0)
Hemoglobin: 9 g/dL — ABNORMAL LOW (ref 12.0–15.0)
Immature Granulocytes: 0 %
Lymphocytes Relative: 12 %
Lymphs Abs: 0.8 10*3/uL (ref 0.7–4.0)
MCH: 29.2 pg (ref 26.0–34.0)
MCHC: 33.2 g/dL (ref 30.0–36.0)
MCV: 88 fL (ref 80.0–100.0)
Monocytes Absolute: 0.8 10*3/uL (ref 0.1–1.0)
Monocytes Relative: 12 %
Neutro Abs: 5 10*3/uL (ref 1.7–7.7)
Neutrophils Relative %: 75 %
Platelets: 205 10*3/uL (ref 150–400)
RBC: 3.08 MIL/uL — ABNORMAL LOW (ref 3.87–5.11)
RDW: 13.7 % (ref 11.5–15.5)
WBC: 6.6 10*3/uL (ref 4.0–10.5)
nRBC: 0 % (ref 0.0–0.2)

## 2022-12-22 LAB — BASIC METABOLIC PANEL
Anion gap: 9 (ref 5–15)
BUN: 10 mg/dL (ref 8–23)
CO2: 22 mmol/L (ref 22–32)
Calcium: 8.1 mg/dL — ABNORMAL LOW (ref 8.9–10.3)
Chloride: 105 mmol/L (ref 98–111)
Creatinine, Ser: 0.97 mg/dL (ref 0.44–1.00)
GFR, Estimated: 53 mL/min — ABNORMAL LOW (ref >60)
Glucose, Bld: 117 mg/dL — ABNORMAL HIGH (ref 70–99)
Potassium: 3.5 mmol/L (ref 3.5–5.1)
Sodium: 136 mmol/L (ref 135–145)

## 2022-12-22 LAB — MAGNESIUM: Magnesium: 2 mg/dL (ref 1.7–2.4)

## 2022-12-22 LAB — BRAIN NATRIURETIC PEPTIDE: B Natriuretic Peptide: 276.3 pg/mL — ABNORMAL HIGH (ref 0.0–100.0)

## 2022-12-22 MED ORDER — POTASSIUM CHLORIDE CRYS ER 20 MEQ PO TBCR
40.0000 meq | EXTENDED_RELEASE_TABLET | Freq: Once | ORAL | Status: AC
  Administered 2022-12-22: 40 meq via ORAL

## 2022-12-22 NOTE — Progress Notes (Signed)
PROGRESS NOTE                                                                                                                                                                                                             Patient Demographics:    Christine Middleton, is a 89 y.o. female, DOB - [birth year], MVH:[id number]  Outpatient Primary MD for the patient is Nelwyn Salisbury, MD    LOS - 3  Admit date - 12/19/2022    Chief Complaint  Patient presents with   GI Bleed    Bloody Stools       Brief Narrative (HPI from H&P)   89 y.o. female with medical history significant for paroxysmal A-fib on Eliquis, chronic HFpEF 60-65%, history of recurrent rectal bleed, history of internal and external hemorrhoids, diverticulosis in the distal rectum and the rectosigmoid colon and in the sigmoid colon, findings seen on colonoscopy done in 2020 with GI recommendation at that time for referral to colorectal surgery to discuss options for hemorrhoid therapy if issues persist, GERD, essential hypertension, history of CVA, chronic anxiety/depression, who presented to [facility] ED from home due to persistent rectal bleeding.  CT scan of the abdomen pelvis in the ER suggested some diverticulosis.  GI was consulted and she was admitted to the hospital for blood per rectum workup.   Subjective:   Patient in bed, appears comfortable, denies any headache, no fever, no chest pain or pressure, no shortness of breath , no abdominal pain. No new focal weakness.  Some blood in stool last night x 2.    Assessment  & Plan :   Acute lower GI bleed.  Painless in the setting of patient being on Eliquis.  Suspicious for diverticular versus hemorrhoidal bleed, CT scan shows diverticulosis, Eliquis on hold, hydrocortisone rectal suppository, monitor CBC which is staying stable, transfuse as needed goal hemoglobin around 8, patient agreeable for transfusion if needed.  Continue to  monitor CBC, had some bleeding night of 12/21/2022, case discussed with GI.  Hold Eliquis for 2 weeks thereafter outpatient GI follow-up.  Patient wanted to hold off on hemorrhoidal banding if possible, if no bleed then likely discharge on 12/23/2022, if rebleeds then hemorrhoidal banding.  GERD.  PPI.   Paroxysmal atrial fibrillation.  [place] vasc 2 score for greater than 3.  Eliquis on hold, low-dose  beta-blocker added.  Essential hypertension.  Low-dose beta-blocker.  Chronic diastolic dysfunction EF 60%.  Currently compensated.  Low-dose beta-blocker.    Weakness, deconditioning, mild protein calorie malnutrition.  PT, OT, protein supplements once taking oral diet.  CKD 3A.  Creatinine close to baseline.  Monitor.  Chronic anxiety and depression.  Remeron.      Condition - Extremely Guarded  Family Communication  :    Daughter [phone]  - 12/20/22 message left at 8.53 am  Son Ramon Dredge [phone],  updated 12/20/22, 12/21/2022 message left at 7:30 AM, 12/22/2022  Orbie Hurst [phone] - updated 12/21/22   Code Status :  Full  Consults  :  GI  PUD Prophylaxis : PPI   Procedures  :     CTA - 1. No acute intra-abdominal or pelvic pathology. No evidence of active GI bleed. 2. Sigmoid diverticulosis without active inflammatory changes. 3. Partially visualized small right pleural effusion with partial compressive atelectasis of the right lower lobe. Pneumonia is not excluded clinical correlation is recommended.      Disposition Plan  :    Status is: Inpatient  DVT Prophylaxis  :    SCDs Start: 12/19/22 2202   Lab Results  Component Value Date   PLT 205 12/22/2022    Diet :  Diet Order             Diet regular Room service appropriate? Yes; Fluid consistency: Thin  Diet effective now                    Inpatient Medications  Scheduled Meds:  docusate sodium  200 mg Oral BID   hydrocortisone  25 mg Rectal BID   metoprolol tartrate  50 mg Oral BID    mirtazapine  7.5 mg Oral QHS   pantoprazole (PROTONIX) IV  40 mg Intravenous BID   polyethylene glycol  17 g Oral Daily   Continuous Infusions:   PRN Meds:.acetaminophen, prochlorperazine  Antibiotics  :    Anti-infectives (From admission, onward)    None         Objective:   Vitals:   12/21/22 2006 12/22/22 0000 12/22/22 0411 12/22/22 0843  BP: 134/80 114/84 (!) 166/84 136/66  Pulse: 75 85 82 91  Resp: 20 20 20    Temp: 98.3 F (36.8 C) 98.2 F (36.8 C) 98.1 F (36.7 C)   TempSrc: Oral Oral Oral   SpO2: 99%     Weight:        Wt Readings from Last 3 Encounters:  12/21/22 61.5 kg  11/14/22 59.6 kg  08/29/22 61.7 kg    No intake or output data in the 24 hours ending 12/22/22 0932   Physical Exam  Awake Alert, No new F.N deficits, Normal affect [place].AT,PERRAL Supple Neck, No JVD,   Symmetrical Chest wall movement, Good air movement bilaterally, CTAB RRR,No Gallops,Rubs or new Murmurs,  +ve B.Sounds, Abd Soft, No tenderness,   No Cyanosis, Clubbing or edema         Data Review:    Recent Labs  Lab 12/19/22 1922 12/19/22 2254 12/20/22 0403 12/20/22 1521 12/21/22 0211 12/22/22 0410  WBC 6.5  --   --   --  8.6 6.6  HGB 8.7* 9.8* 10.4* 9.7* 10.0* 9.0*  HCT 27.3* 30.7* 31.6* 29.5* 30.2* 27.1*  PLT 233  --   --   --  200 205  MCV 90.7  --   --   --  87.3 88.0  MCH 28.9  --   --   --  28.9 29.2  MCHC 31.9  --   --   --  33.1 33.2  RDW 13.5  --   --   --  13.5 13.7  LYMPHSABS 0.8  --   --   --  0.7 0.8  MONOABS 0.7  --   --   --  0.7 0.8  EOSABS 0.3  --   --   --  0.0 0.0  BASOSABS 0.1  --   --   --  0.1 0.1    Recent Labs  Lab 12/19/22 1922 12/21/22 0211 12/22/22 0410  NA 138 139 136  K 4.2 4.1 3.5  CL 105 107 105  CO2 24 22 22   ANIONGAP 9 10 9   GLUCOSE 118* 136* 117*  BUN 16 14 10   CREATININE 1.21* 1.16* 0.97  AST 19  --   --   ALT 15  --   --   ALKPHOS 95  --   --   BILITOT 0.8  --   --   ALBUMIN 3.3*  --   --   INR 1.1  --   --    BNP  --  295.1* 276.3*  MG  --  1.9 2.0  CALCIUM 8.8* 8.6* 8.1*      Recent Labs  Lab 12/19/22 1922 12/21/22 0211 12/22/22 0410  INR 1.1  --   --   BNP  --  295.1* 276.3*  MG  --  1.9 2.0  CALCIUM 8.8* 8.6* 8.1*    Recent Labs  Lab 12/19/22 1922 12/21/22 0211 12/22/22 0410  WBC 6.5 8.6 6.6  PLT 233 200 205  CREATININE 1.21* 1.16* 0.97    ------------------------------------------------------------------------------------------------------------------ Lab Results  Component Value Date   CHOL 218 (H) 11/14/2022   HDL 77.70 11/14/2022   LDLCALC 117 (H) 11/14/2022   LDLDIRECT 113.3 07/18/2012   TRIG 118.0 11/14/2022   CHOLHDL 3 11/14/2022    Lab Results  Component Value Date   HGBA1C 5.9 11/14/2022    Radiology Reports CT ANGIO GI BLEED  Result Date: 12/19/2022 CLINICAL DATA:  Lower GI bleed. EXAM: CTA ABDOMEN AND PELVIS WITHOUT AND WITH CONTRAST TECHNIQUE: Multidetector CT imaging of the abdomen and pelvis was performed using the standard protocol during bolus administration of intravenous contrast. Multiplanar reconstructed images and MIPs were obtained and reviewed to evaluate the vascular anatomy. RADIATION DOSE REDUCTION: This exam was performed according to the departmental dose-optimization program which includes automated exposure control, adjustment of the mA and/or kV according to patient size and/or use of iterative reconstruction technique. CONTRAST:  75mL OMNIPAQUE IOHEXOL 350 MG/ML SOLN COMPARISON:  CT abdomen pelvis dated 06/17/2022. FINDINGS: VASCULAR Aorta: Moderate aortoiliac atherosclerotic disease. No aneurysmal dilatation or dissection. No periaortic fluid collection. Celiac: Atherosclerotic calcification of the origin of the celiac artery. The celiac trunk and its major branches appear patent. SMA: Atherosclerotic calcification of the origin of the SMA. The SMA is patent. Renals: Atherosclerotic calcification of the origins of the renal arteries. The  renal arteries are patent. IMA: The IMA is patent. Inflow: Mild atherosclerotic calcification of the iliac arteries. The iliac arteries are patent. No aneurysmal dilatation or dissection. Proximal Outflow: Bilateral common femoral and visualized portions of the superficial and profunda femoral arteries are patent without evidence of aneurysm, dissection, vasculitis or significant stenosis. Veins: The IVC is unremarkable.  No portal venous gas. Review of the MIP images confirms the above findings. NON-VASCULAR Lower chest: Partially visualized small right pleural effusion with partial compressive atelectasis of the right  lower lobe. Pneumonia is not excluded clinical correlation is recommended. Hepatobiliary: Indeterminate 2 cm hypodense lesion with calcification in the right lobe of the liver, similar to prior CT. The liver is otherwise unremarkable. No biliary dilatation. The gallbladder is unremarkable. Pancreas: Unremarkable. No pancreatic ductal dilatation or surrounding inflammatory changes. Spleen: Normal in size without focal abnormality. Adrenals/Urinary Tract: The adrenal glands are unremarkable. There is no hydronephrosis or nephrolithiasis on either side. There is symmetric enhancement and excretion of contrast by both kidneys. The visualized ureters and urinary bladder appear unremarkable. Stomach/Bowel: There is sigmoid diverticulosis without active inflammatory changes. There is no bowel obstruction or active inflammation. No evidence of active GI bleed. Appendectomy. Lymphatic: No adenopathy. Reproductive: Hysterectomy.  No adnexal masses. Other: Small fat containing umbilical hernia. Musculoskeletal: Osteopenia with degenerative changes of the spine. No acute osseous pathology. IMPRESSION: 1. No acute intra-abdominal or pelvic pathology. No evidence of active GI bleed. 2. Sigmoid diverticulosis without active inflammatory changes. 3. Partially visualized small right pleural effusion with partial  compressive atelectasis of the right lower lobe. Pneumonia is not excluded clinical correlation is recommended. Electronically Signed   By: Elgie Collard M.D.   On: 12/19/2022 21:01      Signature  -   Susa Raring M.D on 12/22/2022 at 9:32 AM   -  To page go to [email or web address]

## 2022-12-22 NOTE — Plan of Care (Signed)

## 2022-12-22 NOTE — Progress Notes (Signed)
Occupational Therapy Treatment Patient Details Name: Christine Middleton MRN: [id number] DOB: 10-26-1922 Today's Date: 12/22/2022   History of present illness 89 y.o. female 89 y.o. female who presented from home due to persistent rectal bleeding on 5/21. PMH: paroxysmal A-fib on Eliquis, chronic HFpEF 60-65%, history of recurrent rectal bleed, history of internal and external hemorrhoids, diverticulosis in the distal rectum and the rectosigmoid colon and in the sigmoid colon, findings seen on colonoscopy done in 2020 with GI recommendation at that time for referral to colorectal surgery to discuss options for hemorrhoid therapy if issues persist, GERD, essential hypertension, history of CVA, chronic anxiety/depression,   OT comments  Pt tearful and making way from [facility] to bed on arrival. Pt with bloody stool and upset because she does not want plans for home tomorrow to be delayed. Provided emotional encouragement and engaged in relaxation strategies. After calm state, engaged in continued education regarding use of IS, and pt with greater technique this session. Will continue to follow.    Recommendations for follow up therapy are one component of a multi-disciplinary discharge planning process, led by the attending physician.  Recommendations may be updated based on patient status, additional functional criteria and insurance authorization.    Assistance Recommended at Discharge Intermittent Supervision/Assistance  Patient can return home with the following  A little help with walking and/or transfers;A little help with bathing/dressing/bathroom;Assist for transportation;Help with stairs or ramp for entrance   Equipment Recommendations  None recommended by OT    Recommendations for Other Services      Precautions / Restrictions Precautions Precautions: Fall Restrictions Weight Bearing Restrictions: No       Mobility Bed Mobility Overal bed mobility: Modified Independent                   Transfers                         Balance                                           ADL either performed or assessed with clinical judgement   ADL Overall ADL's : Needs assistance/impaired                         Toilet Transfer: Min Manufacturing systems engineer Details (indicate cue type and reason): On BSC and getting back to bed on arrival           General ADL Comments: Pt max tearful on arrival due to anticipated dc tomorrow if no more bloody stools and had just obswerved stool to be bloody. Mod encouragement and engagement in coping strategies. After calmed continued education regarding use of IS and pt with greater compliance and technique this sessionj    Extremity/Trunk Assessment Upper Extremity Assessment Upper Extremity Assessment: Generalized weakness   Lower Extremity Assessment Lower Extremity Assessment: Generalized weakness        Vision       Perception     Praxis      Cognition Arousal/Alertness: Awake/alert Behavior During Therapy: WFL for tasks assessed/performed Overall Cognitive Status: Within Functional Limits for tasks assessed                                 General Comments: very  appropriate for age, noted mild forgetfulness        Exercises      Shoulder Instructions       General Comments HR up to 109bom with gait    Pertinent Vitals/ Pain       Pain Assessment Pain Assessment: No/denies pain  Home Living                                          Prior Functioning/Environment              Frequency  Min 2X/week        Progress Toward Goals  OT Goals(current goals can now be found in the care plan section)  Progress towards OT goals: Progressing toward goals  Acute Rehab OT Goals Patient Stated Goal: go home OT Goal Formulation: With patient Time For Goal Achievement: 01/03/23 Potential to Achieve Goals: Good ADL Goals Pt  Will Perform Grooming: with modified independence;standing Pt Will Perform Lower Body Dressing: with modified independence;sit to/from stand Pt Will Transfer to Toilet: with modified independence;ambulating Additional ADL Goal #1: Pt will demonstrate appropriate use of incentive spirometer without cues Additional ADL Goal #2: Pt will perform 3 consecutive ADL in standing with VSS  Plan Discharge plan remains appropriate;Frequency remains appropriate    Co-evaluation                 AM-PAC OT "6 Clicks" Daily Activity     Outcome Measure   Help from another person eating meals?: None Help from another person taking care of personal grooming?: A Little Help from another person toileting, which includes using toliet, bedpan, or urinal?: A Little Help from another person bathing (including washing, rinsing, drying)?: A Little Help from another person to put on and taking off regular upper body clothing?: A Little Help from another person to put on and taking off regular lower body clothing?: A Little 6 Click Score: 19    End of Session    OT Visit Diagnosis: Unsteadiness on feet (R26.81);Muscle weakness (generalized) (M62.81)   Activity Tolerance Patient tolerated treatment well   Patient Left in bed;with call bell/phone within reach;with bed alarm set   Nurse Communication Mobility status        Time: 1610-9604 OT Time Calculation (min): 12 min  Charges: OT General Charges $OT Visit: 1 Visit OT Treatments $Self Care/Home Management : 8-22 mins  Tyler Deis, OTR/L [facility] Acute Rehabilitation Office: [phone]   Myrla Halsted 12/22/2022, 6:58 PM

## 2022-12-22 NOTE — Progress Notes (Signed)
Physical Therapy Treatment Patient Details Name: Christine Middleton MRN: [id number] DOB: 27-Dec-1922 Today's Date: 12/22/2022   History of Present Illness 89 y.o. female 89 y.o. female who presented from home due to persistent rectal bleeding on 5/21. PMH: paroxysmal A-fib on Eliquis, chronic HFpEF 60-65%, history of recurrent rectal bleed, history of internal and external hemorrhoids, diverticulosis in the distal rectum and the rectosigmoid colon and in the sigmoid colon, findings seen on colonoscopy done in 2020 with GI recommendation at that time for referral to colorectal surgery to discuss options for hemorrhoid therapy if issues persist, GERD, essential hypertension, history of CVA, chronic anxiety/depression,    PT Comments    Pt greeted supine in bed and agreeable to session with continued progress towards acute goals. Pt requiring grossly min guard for bed mobility and transfers and light min A during gait for steadying assist as pt benefiting from HHA on R with pt reaching for rail support intermittently. Pt with mild DOE, increasing with talking while ambulating, resolving with standing rest and emphasis on breathing techniques. Pt continues to benefit from skilled PT services to progress toward functional mobility goals.     Recommendations for follow up therapy are one component of a multi-disciplinary discharge planning process, led by the attending physician.  Recommendations may be updated based on patient status, additional functional criteria and insurance authorization.  Follow Up Recommendations       Assistance Recommended at Discharge Frequent or constant Supervision/Assistance (initially 24/7 supervision, son reports between children they can arrange)  Patient can return home with the following A little help with walking and/or transfers;A little help with bathing/dressing/bathroom;Help with stairs or ramp for entrance   Equipment Recommendations  None recommended by PT (has  recommended DME)    Recommendations for Other Services       Precautions / Restrictions Precautions Precautions: Fall Restrictions Weight Bearing Restrictions: No     Mobility  Bed Mobility Overal bed mobility: Modified Independent             General bed mobility comments: HOB elevated, increased time    Transfers Overall transfer level: Needs assistance Equipment used: None Transfers: Sit to/from Stand Sit to Stand: Min guard           General transfer comment: min guard for safety, denies dizziness    Ambulation/Gait Ambulation/Gait assistance: Min assist Gait Distance (Feet): 350 Feet Assistive device: 1 person hand held assist Gait Pattern/deviations: Decreased stride length, Trunk flexed, Step-through pattern Gait velocity: appropriate for age     General Gait Details: pt with fluid gait pattern and age appropriate cadence. pt reports, Pt denies dizziness. no episode of LOB, benefits from R HHA as pt reaching for rail in hall for support. pt with mild DOE espcially with walking and talking resolving with standing rest and periods of not talking and walking   Stairs             Wheelchair Mobility    Modified Rankin (Stroke Patients Only)       Balance Overall balance assessment: Mild deficits observed, not formally tested                                          Cognition Arousal/Alertness: Awake/alert Behavior During Therapy: WFL for tasks assessed/performed Overall Cognitive Status: Within Functional Limits for tasks assessed  General Comments: very appropriate for age, noted mild forgetfulness        Exercises      General Comments General comments (skin integrity, edema, etc.): HR up to 109bom with gait      Pertinent Vitals/Pain Pain Assessment Pain Assessment: No/denies pain    Home Living                          Prior Function             PT Goals (current goals can now be found in the care plan section) Acute Rehab PT Goals PT Goal Formulation: With patient Time For Goal Achievement: 01/03/23 Progress towards PT goals: Progressing toward goals    Frequency    Min 2X/week      PT Plan      Co-evaluation              AM-PAC PT "6 Clicks" Mobility   Outcome Measure  Help needed turning from your back to your side while in a flat bed without using bedrails?: None Help needed moving from lying on your back to sitting on the side of a flat bed without using bedrails?: None Help needed moving to and from a bed to a chair (including a wheelchair)?: A Little Help needed standing up from a chair using your arms (e.g., wheelchair or bedside chair)?: A Little Help needed to walk in hospital room?: A Little Help needed climbing 3-5 steps with a railing? : A Little 6 Click Score: 20    End of Session Equipment Utilized During Treatment: Gait belt Activity Tolerance: Patient tolerated treatment well Patient left: with call bell/phone within reach;in bed;with bed alarm set Nurse Communication: Mobility status PT Visit Diagnosis: Unsteadiness on feet (R26.81);Repeated falls (R29.6);Difficulty in walking, not elsewhere classified (R26.2)     Time: 7829-5621 PT Time Calculation (min) (ACUTE ONLY): 16 min  Charges:  $Gait Training: 8-22 mins                     Alayha Babineaux R. PTA Acute Rehabilitation Services Office: [phone]   Catalina Antigua 12/22/2022, 3:50 PM

## 2022-12-22 NOTE — Care Management Important Message (Signed)
Important Message  Patient Details  Name: Christine Middleton MRN: [id number] Date of Birth: May 16, 1923   Medicare Important Message Given:  Yes     Brealyn Baril Stefan Church 12/22/2022, 3:39 PM

## 2022-12-23 DIAGNOSIS — K625 Hemorrhage of anus and rectum: Secondary | ICD-10-CM | POA: Diagnosis not present

## 2022-12-23 LAB — TYPE AND SCREEN
ABO/RH(D): O POS
Antibody Screen: NEGATIVE
Unit division: 0
Unit division: 0

## 2022-12-23 LAB — CBC WITH DIFFERENTIAL/PLATELET
Abs Immature Granulocytes: 0.02 10*3/uL (ref 0.00–0.07)
Basophils Absolute: 0.1 10*3/uL (ref 0.0–0.1)
Basophils Relative: 1 %
Eosinophils Absolute: 0.2 10*3/uL (ref 0.0–0.5)
Eosinophils Relative: 3 %
HCT: 27.4 % — ABNORMAL LOW (ref 36.0–46.0)
Hemoglobin: 8.9 g/dL — ABNORMAL LOW (ref 12.0–15.0)
Immature Granulocytes: 0 %
Lymphocytes Relative: 12 %
Lymphs Abs: 0.9 10*3/uL (ref 0.7–4.0)
MCH: 28.4 pg (ref 26.0–34.0)
MCHC: 32.5 g/dL (ref 30.0–36.0)
MCV: 87.5 fL (ref 80.0–100.0)
Monocytes Absolute: 0.8 10*3/uL (ref 0.1–1.0)
Monocytes Relative: 11 %
Neutro Abs: 5.2 10*3/uL (ref 1.7–7.7)
Neutrophils Relative %: 73 %
Platelets: 189 10*3/uL (ref 150–400)
RBC: 3.13 MIL/uL — ABNORMAL LOW (ref 3.87–5.11)
RDW: 13.7 % (ref 11.5–15.5)
WBC: 7.1 10*3/uL (ref 4.0–10.5)
nRBC: 0 % (ref 0.0–0.2)

## 2022-12-23 LAB — BPAM RBC
Blood Product Expiration Date: 202406122359
ISSUE DATE / TIME: 202405212102

## 2022-12-23 MED ORDER — HYDROCORTISONE ACETATE 25 MG RE SUPP: 25.0000 mg | Freq: Two times a day (BID) | RECTAL | 0 refills | Status: DC

## 2022-12-23 MED ORDER — DOCUSATE SODIUM 100 MG PO CAPS: 200.0000 mg | ORAL_CAPSULE | Freq: Two times a day (BID) | ORAL | 1 refills | Status: DC

## 2022-12-23 MED ORDER — APIXABAN 2.5 MG PO TABS: 2.5000 mg | ORAL_TABLET | Freq: Two times a day (BID) | ORAL | 3 refills | Status: DC

## 2022-12-23 NOTE — Progress Notes (Signed)
Pt had small bowel movement with a large amount of bright red blood in BSC. Pt denies pain, weakness/dizziness.

## 2022-12-23 NOTE — Discharge Instructions (Signed)
Follow with Primary MD Nelwyn Salisbury, MD and your gastroenterologist in 7 days, do not take your Coumadin for another 10 days.  Get CBC, CMP -  checked next visit with your primary MD   Activity: As tolerated with Full fall precautions use walker/cane & assistance as needed  Disposition Home   Diet: Heart Healthy    Special Instructions: If you have smoked or chewed Tobacco  in the last 2 yrs please stop smoking, stop any regular Alcohol  and or any Recreational drug use.  On your next visit with your primary care physician please Get Medicines reviewed and adjusted.  Please request your Prim.MD to go over all Hospital Tests and Procedure/Radiological results at the follow up, please get all Hospital records sent to your Prim MD by signing hospital release before you go home.  If you experience worsening of your admission symptoms, develop shortness of breath, life threatening emergency, suicidal or homicidal thoughts you must seek medical attention immediately by calling 911 or calling your MD immediately  if symptoms less severe.  You Must read complete instructions/literature along with all the possible adverse reactions/side effects for all the Medicines you take and that have been prescribed to you. Take any new Medicines after you have completely understood and accpet all the possible adverse reactions/side effects.

## 2022-12-23 NOTE — Progress Notes (Signed)
Nsg Discharge Note  Admit Date:  12/19/2022 Discharge date: 12/23/2022   Christine Middleton to be D/C'd Home per MD order.  AVS completed. Patient/caregiver able to verbalize understanding.  Discharge Medication: Allergies as of 12/23/2022       Reactions   Amoxicillin    REACTION: rash   Codeine    Lipitor [atorvastatin]    Muscle pain, stiff joints   Lisinopril    Nitrofurantoin    Tramadol Anxiety   Sedation, worsening depression.         Medication List     TAKE these medications    acetaminophen 650 MG CR tablet Commonly known as: TYLENOL Take 650 mg by mouth every 8 (eight) hours as needed for pain.   apixaban 2.5 MG Tabs tablet Commonly known as: Eliquis Take 1 tablet (2.5 mg total) by mouth 2 (two) times daily. Start taking on: January 02, 2023 What changed: These instructions start on January 02, 2023. If you are unsure what to do until then, ask your doctor or other care provider.   atenolol 50 MG tablet Commonly known as: TENORMIN TAKE 1 TABLET BY MOUTH EVERY DAY   docusate sodium 100 MG capsule Commonly known as: COLACE Take 2 capsules (200 mg total) by mouth 2 (two) times daily. What changed: how much to take   hydrocortisone 25 MG suppository Commonly known as: ANUSOL-HC Place 1 suppository (25 mg total) rectally 2 (two) times daily.   losartan 25 MG tablet Commonly known as: COZAAR Take 1 tablet (25 mg total) by mouth every evening.   mirtazapine 15 MG tablet Commonly known as: REMERON Take 1/2 (7.5 mg) tablet by mouth at bedtime   pantoprazole 40 MG tablet Commonly known as: PROTONIX TAKE 1 TABLET BY MOUTH EVERY DAY   polyethylene glycol 17 g packet Commonly known as: MIRALAX / GLYCOLAX Take 17 g by mouth daily. What changed:  when to take this reasons to take this        Discharge Assessment: Vitals:   12/23/22 0800 12/23/22 0816  BP: (!) 148/75 (!) 148/75  Pulse:  96  Resp: 18   Temp: 98.7 F (37.1 C)   SpO2:     Skin clean, dry  and intact without evidence of skin break down, no evidence of skin tears noted. IV catheter discontinued intact. Site without signs and symptoms of complications - no redness or edema noted at insertion site, patient denies c/o pain - only slight tenderness at site.  Dressing with slight pressure applied.  D/c Instructions-Education: Discharge instructions given to patient/family with verbalized understanding. D/c education completed with patient/family including follow up instructions, medication list, d/c activities limitations if indicated, with other d/c instructions as indicated by MD - patient able to verbalize understanding, all questions fully answered. Patient instructed to return to ED, call 911, or call MD for any changes in condition.  Patient escorted via WC, and D/C home via private auto.  Kizzie Bane, RN 12/23/2022 10:40 AM

## 2022-12-23 NOTE — Discharge Summary (Signed)
Christine Middleton ZOX:[id number] DOB: 10/30/1922 DOA: 12/19/2022  PCP: Nelwyn Salisbury, MD  Admit date: 12/19/2022  Discharge date: 12/23/2022  Admitted From: Home   Disposition:  Home   Recommendations for Outpatient Follow-up:   Follow up with PCP in 1-2 weeks  PCP Please obtain BMP/CBC, 2 view CXR in 1week,  (see Discharge instructions)   PCP Please follow up on the following pending results:    Home Health: None   Equipment/Devices: None  Consultations: GI Discharge Condition: Stable    CODE STATUS: Full    Diet Recommendation: Heart Healthy     Chief Complaint  Patient presents with   GI Bleed    Bloody Stools     Brief history of present illness from the day of admission and additional interim summary    89 y.o. female with medical history significant for paroxysmal A-fib on Eliquis, chronic HFpEF 60-65%, history of recurrent rectal bleed, history of internal and external hemorrhoids, diverticulosis in the distal rectum and the rectosigmoid colon and in the sigmoid colon, findings seen on colonoscopy done in 2020 with GI recommendation at that time for referral to colorectal surgery to discuss options for hemorrhoid therapy if issues persist, GERD, essential hypertension, history of CVA, chronic anxiety/depression, who presented to [facility] ED from home due to persistent rectal bleeding.   CT scan of the abdomen pelvis in the ER suggested some diverticulosis.  GI was consulted and she was admitted to the hospital for blood per rectum workup.                                                                 Hospital Course   Acute lower GI bleed.  Painless in the setting of patient being on Eliquis.  Suspicious for diverticular versus hemorrhoidal bleed, CT scan shows diverticulosis, course was held after discussions  with GI, clinically this appears more consistent with a hemorrhoidal bleed.  Bleeding is now very mild, patient was offered hemorrhoidal banding by GI but she wanted to wait and watch.  She has been placed on stool softener, per discussion with GI her Eliquis will be held for a total of 2 weeks this was discussed with patient who agrees with the plan, son also updated.  If bleeding reoccurs then outpatient hemorrhoidal banding is being considered, H&H stable patient will be discharged home on bowel regimen with close outpatient follow-up with PCP and gastroenterology.  GERD.  PPI.   Paroxysmal atrial fibrillation.  [place] vasc 2 score for greater than 3.  Eliquis on hold, low-dose beta-blocker added.   Essential hypertension.  Low-dose beta-blocker.   Chronic diastolic dysfunction EF 60%.  Currently compensated.  Low-dose beta-blocker.     Weakness, deconditioning, mild protein calorie malnutrition.  PT, OT, protein supplements once taking oral diet.   CKD  3A.  Creatinine close to baseline.  Monitor.   Chronic anxiety and depression.  Remeron.    Discharge diagnosis     Principal Problem:   Rectal bleeding Active Problems:   Acute post-hemorrhagic anemia   Long term current use of anticoagulant therapy   Internal hemorrhoids    Discharge instructions    Discharge Instructions     Diet - low sodium heart healthy   Complete by: As directed    Discharge instructions   Complete by: As directed    Follow with Primary MD Nelwyn Salisbury, MD and your gastroenterologist in 7 days, do not take your Coumadin for another 10 days.  Get CBC, CMP -  checked next visit with your primary MD   Activity: As tolerated with Full fall precautions use walker/cane & assistance as needed  Disposition Home   Diet: Heart Healthy    Special Instructions: If you have smoked or chewed Tobacco  in the last 2 yrs please stop smoking, stop any regular Alcohol  and or any Recreational drug use.  On your  next visit with your primary care physician please Get Medicines reviewed and adjusted.  Please request your Prim.MD to go over all Hospital Tests and Procedure/Radiological results at the follow up, please get all Hospital records sent to your Prim MD by signing hospital release before you go home.  If you experience worsening of your admission symptoms, develop shortness of breath, life threatening emergency, suicidal or homicidal thoughts you must seek medical attention immediately by calling 911 or calling your MD immediately  if symptoms less severe.  You Must read complete instructions/literature along with all the possible adverse reactions/side effects for all the Medicines you take and that have been prescribed to you. Take any new Medicines after you have completely understood and accpet all the possible adverse reactions/side effects.   Increase activity slowly   Complete by: As directed        Discharge Medications   Allergies as of 12/23/2022       Reactions   Amoxicillin    REACTION: rash   Codeine    Lipitor [atorvastatin]    Muscle pain, stiff joints   Lisinopril    Nitrofurantoin    Tramadol Anxiety   Sedation, worsening depression.         Medication List     TAKE these medications    acetaminophen 650 MG CR tablet Commonly known as: TYLENOL Take 650 mg by mouth every 8 (eight) hours as needed for pain.   apixaban 2.5 MG Tabs tablet Commonly known as: Eliquis Take 1 tablet (2.5 mg total) by mouth 2 (two) times daily. Start taking on: January 02, 2023 What changed: These instructions start on January 02, 2023. If you are unsure what to do until then, ask your doctor or other care provider.   atenolol 50 MG tablet Commonly known as: TENORMIN TAKE 1 TABLET BY MOUTH EVERY DAY   docusate sodium 100 MG capsule Commonly known as: COLACE Take 2 capsules (200 mg total) by mouth 2 (two) times daily. What changed: how much to take   hydrocortisone 25 MG  suppository Commonly known as: ANUSOL-HC Place 1 suppository (25 mg total) rectally 2 (two) times daily.   losartan 25 MG tablet Commonly known as: COZAAR Take 1 tablet (25 mg total) by mouth every evening.   mirtazapine 15 MG tablet Commonly known as: REMERON Take 1/2 (7.5 mg) tablet by mouth at bedtime   pantoprazole 40 MG tablet  Commonly known as: PROTONIX TAKE 1 TABLET BY MOUTH EVERY DAY   polyethylene glycol 17 g packet Commonly known as: MIRALAX / GLYCOLAX Take 17 g by mouth daily. What changed:  when to take this reasons to take this         Follow-up Information     Nelwyn Salisbury, MD. Schedule an appointment as soon as possible for a visit in 1 week(s).   Specialty: Family Medicine Contact information: [address] [place] [address] [phone]         Benancio Deeds, MD. Schedule an appointment as soon as possible for a visit in 1 week(s).   Specialty: Gastroenterology Contact information: [address] [place] [address] [phone]                 Major procedures and Radiology Reports - PLEASE review detailed and final reports thoroughly  -       CT ANGIO GI BLEED  Result Date: 12/19/2022 CLINICAL DATA:  Lower GI bleed. EXAM: CTA ABDOMEN AND PELVIS WITHOUT AND WITH CONTRAST TECHNIQUE: Multidetector CT imaging of the abdomen and pelvis was performed using the standard protocol during bolus administration of intravenous contrast. Multiplanar reconstructed images and MIPs were obtained and reviewed to evaluate the vascular anatomy. RADIATION DOSE REDUCTION: This exam was performed according to the departmental dose-optimization program which includes automated exposure control, adjustment of the mA and/or kV according to patient size and/or use of iterative reconstruction technique. CONTRAST:  75mL OMNIPAQUE IOHEXOL 350 MG/ML SOLN COMPARISON:  CT abdomen pelvis dated 06/17/2022. FINDINGS: VASCULAR Aorta: Moderate  aortoiliac atherosclerotic disease. No aneurysmal dilatation or dissection. No periaortic fluid collection. Celiac: Atherosclerotic calcification of the origin of the celiac artery. The celiac trunk and its major branches appear patent. SMA: Atherosclerotic calcification of the origin of the SMA. The SMA is patent. Renals: Atherosclerotic calcification of the origins of the renal arteries. The renal arteries are patent. IMA: The IMA is patent. Inflow: Mild atherosclerotic calcification of the iliac arteries. The iliac arteries are patent. No aneurysmal dilatation or dissection. Proximal Outflow: Bilateral common femoral and visualized portions of the superficial and profunda femoral arteries are patent without evidence of aneurysm, dissection, vasculitis or significant stenosis. Veins: The IVC is unremarkable.  No portal venous gas. Review of the MIP images confirms the above findings. NON-VASCULAR Lower chest: Partially visualized small right pleural effusion with partial compressive atelectasis of the right lower lobe. Pneumonia is not excluded clinical correlation is recommended. Hepatobiliary: Indeterminate 2 cm hypodense lesion with calcification in the right lobe of the liver, similar to prior CT. The liver is otherwise unremarkable. No biliary dilatation. The gallbladder is unremarkable. Pancreas: Unremarkable. No pancreatic ductal dilatation or surrounding inflammatory changes. Spleen: Normal in size without focal abnormality. Adrenals/Urinary Tract: The adrenal glands are unremarkable. There is no hydronephrosis or nephrolithiasis on either side. There is symmetric enhancement and excretion of contrast by both kidneys. The visualized ureters and urinary bladder appear unremarkable. Stomach/Bowel: There is sigmoid diverticulosis without active inflammatory changes. There is no bowel obstruction or active inflammation. No evidence of active GI bleed. Appendectomy. Lymphatic: No adenopathy. Reproductive:  Hysterectomy.  No adnexal masses. Other: Small fat containing umbilical hernia. Musculoskeletal: Osteopenia with degenerative changes of the spine. No acute osseous pathology. IMPRESSION: 1. No acute intra-abdominal or pelvic pathology. No evidence of active GI bleed. 2. Sigmoid diverticulosis without active inflammatory changes. 3. Partially visualized small right pleural effusion with partial compressive atelectasis of the right  lower lobe. Pneumonia is not excluded clinical correlation is recommended. Electronically Signed   By: Elgie Collard M.D.   On: 12/19/2022 21:01    Micro Results    No results found for this or any previous visit (from the past 240 hour(s)).  Today   Subjective    Christine Middleton today has no headache,no chest abdominal pain,no new weakness tingling or numbness, feels much better wants to go home today.    Objective   Blood pressure (!) 148/75, pulse 96, temperature 98.3 F (36.8 C), temperature source Oral, resp. rate 20, weight 61.5 kg, SpO2 98 %.  No intake or output data in the 24 hours ending 12/23/22 0853  Exam  Awake Alert, No new F.N deficits,    Gleed.AT,PERRAL Supple Neck,   Symmetrical Chest wall movement, Good air movement bilaterally, CTAB RRR,No Gallops,   +ve B.Sounds, Abd Soft, Non tender,  No Cyanosis, Clubbing or edema    Data Review   Recent Labs  Lab 12/19/22 1922 12/19/22 2254 12/20/22 1521 12/21/22 0211 12/22/22 0410 12/22/22 1123 12/23/22 0236  WBC 6.5  --   --  8.6 6.6 6.8 7.1  HGB 8.7*   < > 9.7* 10.0* 9.0* 10.0* 8.9*  HCT 27.3*   < > 29.5* 30.2* 27.1* 30.8* 27.4*  PLT 233  --   --  200 205 229 189  MCV 90.7  --   --  87.3 88.0 88.3 87.5  MCH 28.9  --   --  28.9 29.2 28.7 28.4  MCHC 31.9  --   --  33.1 33.2 32.5 32.5  RDW 13.5  --   --  13.5 13.7 13.8 13.7  LYMPHSABS 0.8  --   --  0.7 0.8  --  0.9  MONOABS 0.7  --   --  0.7 0.8  --  0.8  EOSABS 0.3  --   --  0.0 0.0  --  0.2  BASOSABS 0.1  --   --  0.1 0.1  --  0.1    < > = values in this interval not displayed.    Recent Labs  Lab 12/19/22 1922 12/21/22 0211 12/22/22 0410  NA 138 139 136  K 4.2 4.1 3.5  CL 105 107 105  CO2 24 22 22   ANIONGAP 9 10 9   GLUCOSE 118* 136* 117*  BUN 16 14 10   CREATININE 1.21* 1.16* 0.97  AST 19  --   --   ALT 15  --   --   ALKPHOS 95  --   --   BILITOT 0.8  --   --   ALBUMIN 3.3*  --   --   INR 1.1  --   --   BNP  --  295.1* 276.3*  MG  --  1.9 2.0  CALCIUM 8.8* 8.6* 8.1*    Total Time in preparing paper work, data evaluation and todays exam - 35 minutes  Signature  -    Susa Raring M.D on 12/23/2022 at 8:53 AM   -  To page go to [email or web address]

## 2022-12-26 ENCOUNTER — Telehealth: Payer: Self-pay

## 2022-12-26 ENCOUNTER — Ambulatory Visit: Payer: Self-pay | Admitting: *Deleted

## 2022-12-26 NOTE — Chronic Care Management (AMB) (Signed)
   12/26/2022  Christine Middleton 11-27-1922 [id number]   Patient is not participating in Chronic Care Management (CCM) services, enrollment status changed to previously enrolled.  Irving Shows [facility], BSN RN Case Manager [phone]

## 2022-12-26 NOTE — Transitions of Care (Post Inpatient/ED Visit) (Signed)
12/26/2022  Name: Christine Middleton MRN: [id number] DOB: [birth year]  Today's TOC FU Call Status: Today's TOC FU Call Status:: Successful TOC FU Call Competed TOC FU Call Complete Date: 12/26/22  Transition Care Management Follow-up Telephone Call Date of Discharge: 12/23/22 Discharge Facility: [facility]) Type of Discharge: Inpatient Admission Primary Inpatient Discharge Diagnosis:: "GI hemorrhage" How have you been since you were released from the hospital?: Better (Spoke w/ son-Edward-states pt "doing very well-she is back to her normal activities and routine." Appetite fair-drinking nutiritonal supplements. She has had a BM since returning home and it was normal.) Any questions or concerns?: No  Items Reviewed: Did you receive and understand the discharge instructions provided?: Yes Medications obtained,verified, and reconciled?: Yes (Medications Reviewed) Any new allergies since your discharge?: No Dietary orders reviewed?: Yes Type of Diet Ordered:: low salt/heart healthy Do you have support at home?: Yes People in Home: child(ren), adult Name of Support/Comfort Primary Source: Ramon Dredge, other son and daughter helps out as well  Medications Reviewed Today: Medications Reviewed Today     Reviewed by Charlyn Minerva, RN (Registered Nurse) on 12/26/22 at 1349  Med List Status: <None>   Medication Order Taking? Sig Documenting Provider Last Dose Status Informant  acetaminophen (TYLENOL) 650 MG CR tablet [id number] Yes Take 650 mg by mouth every 8 (eight) hours as needed for pain. [provider] Taking Active Self, Family Member, Pharmacy Records  apixaban (ELIQUIS) 2.5 MG TABS tablet [id number]  Take 1 tablet (2.5 mg total) by mouth 2 (two) times daily. Leroy Sea, MD  Active   atenolol (TENORMIN) 50 MG tablet [id number] Yes TAKE 1 TABLET BY MOUTH EVERY DAY Nelwyn Salisbury, MD Taking Active Self, Family Member, Pharmacy Records  docusate sodium (COLACE) 100  MG capsule [id number] Yes Take 2 capsules (200 mg total) by mouth 2 (two) times daily. Leroy Sea, MD Taking Active   hydrocortisone (ANUSOL-HC) 25 MG suppository [id number] Yes Place 1 suppository (25 mg total) rectally 2 (two) times daily. Leroy Sea, MD Taking Active   losartan (COZAAR) 25 MG tablet [id number] Yes Take 1 tablet (25 mg total) by mouth every evening. Nelwyn Salisbury, MD Taking Active Self, Family Member, Pharmacy Records  mirtazapine (REMERON) 15 MG tablet [id number] Yes Take 1/2 (7.5 mg) tablet by mouth at bedtime Nelwyn Salisbury, MD Taking Active Self, Family Member, Pharmacy Records  pantoprazole (PROTONIX) 40 MG tablet [id number] Yes TAKE 1 TABLET BY MOUTH EVERY DAY Nelwyn Salisbury, MD Taking Active Self, Family Member, Pharmacy Records  polyethylene glycol (MIRALAX / GLYCOLAX) 17 g packet [id number] Yes Take 17 g by mouth daily.  Patient taking differently: Take 17 g by mouth as needed.   Jae Dire, MD Taking Active Self, Family Member, Pharmacy Records            Home Care and Equipment/Supplies: Were Home Health Services Ordered?: NA Any new equipment or medical supplies ordered?: NA  Functional Questionnaire: Do you need assistance with bathing/showering or dressing?: No Do you need assistance with meal preparation?: No Do you need assistance with eating?: No Do you have difficulty maintaining continence: Yes Do you need assistance with getting out of bed/getting out of a chair/moving?: No Do you have difficulty managing or taking your medications?: No  Follow up appointments reviewed: PCP Follow-up appointment confirmed?: No (Son states that is other brother and sister handles making pt's appts and he will advise them to call office and make an appt) MD Provider  Line [phone] Given: No Specialist Hospital Follow-up appointment confirmed?: No Reason Specialist Follow-Up Not Confirmed: Patient has Specialist Provider Number and will Call  for Appointment (Son states that is other brother and sister handles making pt's appts and he will advise them to call office and make an appt) Do you need transportation to your follow-up appointment?: No (son confirms that family able to get pt to appts) Do you understand care options if your condition(s) worsen?: Yes-patient verbalized understanding  SDOH Interventions Today    Flowsheet Row Most Recent Value  SDOH Interventions   Food Insecurity Interventions Intervention Not Indicated      TOC Interventions Today    Flowsheet Row Most Recent Value  TOC Interventions   TOC Interventions Discussed/Reviewed TOC Interventions Discussed      Interventions Today    Flowsheet Row Most Recent Value  General Interventions   General Interventions Discussed/Reviewed General Interventions Discussed, Doctor Visits  Doctor Visits Discussed/Reviewed Doctor Visits Discussed, PCP, Specialist  PCP/Specialist Visits Compliance with follow-up visit  Education Interventions   Education Provided Provided Education  Provided Verbal Education On Nutrition, When to see the doctor, Medication, Other  Nutrition Interventions   Nutrition Discussed/Reviewed Nutrition Discussed, Adding fruits and vegetables, Decreasing salt  Pharmacy Interventions   Pharmacy Dicussed/Reviewed Pharmacy Topics Discussed, Medications and their functions  Safety Interventions   Safety Discussed/Reviewed Safety Discussed, Home Safety  Home Safety Assistive Devices  [son states pt very active-walks to [place] a couple of times per day- goes up & down stairs on her own-she uses walker]       Antionette Fairy, RN,BSN,CCM [facility] Care Management Care Management Community Coordinator Direct Phone: [phone] Toll Free: [phone] Fax: [phone]

## 2022-12-27 ENCOUNTER — Encounter: Payer: Self-pay | Admitting: Gastroenterology

## 2023-01-03 ENCOUNTER — Telehealth: Payer: Self-pay

## 2023-01-03 NOTE — Progress Notes (Signed)
Care Management & Coordination Services Pharmacy Team  Reason for Encounter: Hypertension  Contacted patient to discuss hypertension disease state. Spoke with patient on 01/03/2023     Current antihypertensive regimen:  Atenolol 50 mg daily Losartan 25 mg daily Patient verbally confirms she is taking the above medications as directed. Yes  How often are you checking your Blood Pressure? Patient states she is currently not checking blood pressures. They have always been good in the past and she feels there is no need to keep checking.  What recent interventions/DTPs have been made by any provider to improve Blood Pressure control since last CPP Visit: No recent interventions  Any recent hospitalizations or ED visits since last visit with CPP? Admitted to [facility] on 12/19/2022 due to rectal bleeding. Discharge date was 12/23/2022.   What diet changes have been made to improve Blood Pressure Control?  Patient follows low sodium Breakfast - cheerios and grapefruit Lunch - meal for lunch which includes a meat and vegetable Dinner - something light if she is [place] Caffeine intake - patient drinks 1/2 cup daily Salt intake - patient does not add salt   What exercise is being done to improve your Blood Pressure Control?  Patient will walk to her mailbox everyday with her cane. She states she does walk around her house during the day and will do all her own housework and laundry. However, she is feeling more tired these days.  Adherence Review: Is the patient currently on ACE/ARB medication? Yes Does the patient have >5 day gap between last estimated fill dates? No  Care Gaps: AWV - completed 02/16/2022 Dexa scan - postponed Covid - postponed Shingrix - postponed   Star Rating Drug: Losartan 25 mg - last filled 11/18/2022 90 DS at [facility]  Chart Updates: Recent office visits:  11/14/2022 Gershon Crane MD - Patient was seen for Paroxysmal atrial fibrillation and  additonal concerns. Discontinued Vit D.   Recent consult visits:  None  Hospital visits:  Admitted to [facility] on 12/19/2022 due to rectal bleeding. Discharge date was 12/23/2022.    New?Medications Started at [facility] Discharge:?? hydrocortisone (ANUSOL-HC) Medication Changes at Hospital Discharge: apixaban (Eliquis) docusate sodium (COLACE) Medications Discontinued at Hospital Discharge: None Medications that remain the same after Hospital Discharge:??  -All other medications will remain the same.    Medications: Outpatient Encounter Medications as of 01/03/2023  Medication Sig   acetaminophen (TYLENOL) 650 MG CR tablet Take 650 mg by mouth every 8 (eight) hours as needed for pain.   apixaban (ELIQUIS) 2.5 MG TABS tablet Take 1 tablet (2.5 mg total) by mouth 2 (two) times daily.   atenolol (TENORMIN) 50 MG tablet TAKE 1 TABLET BY MOUTH EVERY DAY   docusate sodium (COLACE) 100 MG capsule Take 2 capsules (200 mg total) by mouth 2 (two) times daily.   hydrocortisone (ANUSOL-HC) 25 MG suppository Place 1 suppository (25 mg total) rectally 2 (two) times daily.   losartan (COZAAR) 25 MG tablet Take 1 tablet (25 mg total) by mouth every evening.   mirtazapine (REMERON) 15 MG tablet Take 1/2 (7.5 mg) tablet by mouth at bedtime   pantoprazole (PROTONIX) 40 MG tablet TAKE 1 TABLET BY MOUTH EVERY DAY   polyethylene glycol (MIRALAX / GLYCOLAX) 17 g packet Take 17 g by mouth daily. (Patient taking differently: Take 17 g by mouth as needed.)   No facility-administered encounter medications on file as of 01/03/2023.  Fill History:  Dispensed Days Supply Quantity Provider  Pharmacy  ELIQUIS 2.5 MG TABS 12/28/2022 90 180 tablet      Dispensed Days Supply Quantity Provider Pharmacy  ATENOLOL 50 MG TABLET 11/04/2022 90 90 each      Dispensed Days Supply Quantity Provider Pharmacy  DOCUSATE SODIUM 100 MG SOFTGEL 12/23/2022 15 60 each      Dispensed Days Supply Quantity Provider  Pharmacy  HYDROCORTISONE AC 25 MG SUPP 12/23/2022 30 60 each      Dispensed Days Supply Quantity Provider Pharmacy  LOSARTAN POTASSIUM 25 MG TAB 11/18/2022 90 90 each      Dispensed Days Supply Quantity Provider Pharmacy  MIRTAZAPINE 15 MG TABLET 11/14/2022 90 45 each      Dispensed Days Supply Quantity Provider Pharmacy  PANTOPRAZOLE SOD DR 40 MG TAB 11/03/2022 90 90 each     Recent Office Vitals: BP Readings from Last 3 Encounters:  12/23/22 (!) 148/75  11/14/22 120/78  08/29/22 124/70   Pulse Readings from Last 3 Encounters:  12/23/22 96  11/14/22 73  08/29/22 86    Wt Readings from Last 3 Encounters:  12/21/22 135 lb 9.3 oz (61.5 kg)  11/14/22 131 lb 6.4 oz (59.6 kg)  08/29/22 136 lb (61.7 kg)     Kidney Function Lab Results  Component Value Date/Time   CREATININE 0.97 12/22/2022 04:10 AM   CREATININE 1.16 (H) 12/21/2022 02:11 AM   CREATININE 0.81 05/02/2021 11:27 AM   CREATININE 0.83 11/26/2020 12:36 PM   GFR 44.80 (L) 11/14/2022 11:38 AM   GFRNONAA 53 (L) 12/22/2022 04:10 AM   GFRNONAA >60 05/02/2021 11:27 AM   GFRAA >60 07/11/2019 06:36 AM       Latest Ref Rng & Units 12/22/2022    4:10 AM 12/21/2022    2:11 AM 12/19/2022    7:22 PM  BMP  Glucose 70 - 99 mg/dL 161  096  045   BUN 8 - 23 mg/dL 10  14  16    Creatinine 0.44 - 1.00 mg/dL 4.09  8.11  9.14   Sodium 135 - 145 mmol/L 136  139  138   Potassium 3.5 - 5.1 mmol/L 3.5  4.1  4.2   Chloride 98 - 111 mmol/L 105  107  105   CO2 22 - 32 mmol/L 22  22  24    Calcium 8.9 - 10.3 mg/dL 8.1  8.6  8.8     Inetta Fermo [facility]  Clinical Pharmacist Assistant [phone]

## 2023-01-05 ENCOUNTER — Ambulatory Visit (INDEPENDENT_AMBULATORY_CARE_PROVIDER_SITE_OTHER): Payer: Medicare Other | Admitting: Family Medicine

## 2023-01-05 ENCOUNTER — Encounter: Payer: Self-pay | Admitting: Family Medicine

## 2023-01-05 VITALS — BP 124/60 | HR 62 | Temp 97.9°F | Wt 130.8 lb

## 2023-01-05 DIAGNOSIS — Z7901 Long term (current) use of anticoagulants: Secondary | ICD-10-CM

## 2023-01-05 DIAGNOSIS — K625 Hemorrhage of anus and rectum: Secondary | ICD-10-CM | POA: Diagnosis not present

## 2023-01-05 DIAGNOSIS — D62 Acute posthemorrhagic anemia: Secondary | ICD-10-CM | POA: Diagnosis not present

## 2023-01-05 DIAGNOSIS — I480 Paroxysmal atrial fibrillation: Secondary | ICD-10-CM

## 2023-01-05 LAB — CBC WITH DIFFERENTIAL/PLATELET
Basophils Absolute: 0.1 10*3/uL (ref 0.0–0.1)
Basophils Relative: 1 % (ref 0.0–3.0)
Eosinophils Absolute: 0.1 10*3/uL (ref 0.0–0.7)
Eosinophils Relative: 1.8 % (ref 0.0–5.0)
HCT: 26.1 % — ABNORMAL LOW (ref 36.0–46.0)
Hemoglobin: 8.6 g/dL — ABNORMAL LOW (ref 12.0–15.0)
Lymphocytes Relative: 10.6 % — ABNORMAL LOW (ref 12.0–46.0)
Lymphs Abs: 0.6 10*3/uL — ABNORMAL LOW (ref 0.7–4.0)
MCHC: 32.9 g/dL (ref 30.0–36.0)
MCV: 88.2 fl (ref 78.0–100.0)
Monocytes Absolute: 0.9 10*3/uL (ref 0.1–1.0)
Monocytes Relative: 14 % — ABNORMAL HIGH (ref 3.0–12.0)
Neutro Abs: 4.4 10*3/uL (ref 1.4–7.7)
Neutrophils Relative %: 72.6 % (ref 43.0–77.0)
Platelets: 316 10*3/uL (ref 150.0–400.0)
RBC: 2.96 Mil/uL — ABNORMAL LOW (ref 3.87–5.11)
RDW: 14.4 % (ref 11.5–15.5)
WBC: 6.1 10*3/uL (ref 4.0–10.5)

## 2023-01-05 LAB — BASIC METABOLIC PANEL
BUN: 21 mg/dL (ref 6–23)
CO2: 24 mEq/L (ref 19–32)
Calcium: 8.9 mg/dL (ref 8.4–10.5)
Chloride: 102 mEq/L (ref 96–112)
Creatinine, Ser: 1.07 mg/dL (ref 0.40–1.20)
GFR: 43.25 mL/min — ABNORMAL LOW (ref >60.00)
Glucose, Bld: 123 mg/dL — ABNORMAL HIGH (ref 70–99)
Potassium: 4.1 mEq/L (ref 3.5–5.1)
Sodium: 138 mEq/L (ref 135–145)

## 2023-01-05 NOTE — Progress Notes (Signed)
   Subjective:    Patient ID: Christine Middleton, female    DOB: 1923-02-10, 89 y.o.   MRN: [id number]  HPI Here with her daughter to follow up a hospital stay from 12-19-22 to 12-23-22 for rectal bleeding. She has known internal hemorrhoids, and she has had intermittent painless rectal bleeding since she was started on Eliquis. Prior to this admission, however, she was passing more blood than usual. No chest pain or SOB. On admission her Hgb was 8.7, so she was admitted. She was transfused one unit of PRBC, and the Hgb transiently went up to 10.0. By the time of DC this was back down to 8.9. GI was consulted and they recommended that the hemorrhoids be banded, but Christine Middleton declined the procedure. Since getting home she has had 2 episodes of light rectal bleeding. She is taking Colace 100 m 2 tabs (200 mg) BID. She was told to hold the Eliquis for 2 weeks, and now she had been back on this for 4 days. Her renal status was stable, with a creatinine of 0.97 and a GFR of 53. Her daughter says she has been a little more confused than before since she got home.    Review of Systems  Constitutional: Negative.   Respiratory: Negative.    Cardiovascular: Negative.   Gastrointestinal:  Positive for anal bleeding. Negative for abdominal distention, abdominal pain, blood in stool, constipation, diarrhea, nausea, rectal pain and vomiting.  Genitourinary: Negative.        Objective:   Physical Exam Constitutional:      Appearance: Normal appearance.     Comments: Walks with a cane   Cardiovascular:     Rate and Rhythm: Normal rate. Rhythm irregular.     Pulses: Normal pulses.     Heart sounds: Normal heart sounds.  Pulmonary:     Effort: Pulmonary effort is normal.     Breath sounds: Normal breath sounds.  Abdominal:     General: Abdomen is flat. Bowel sounds are normal. There is no distension.     Palpations: Abdomen is soft. There is no mass.     Tenderness: There is no abdominal tenderness. There is no  right CVA tenderness, left CVA tenderness, guarding or rebound.     Hernia: No hernia is present.  Musculoskeletal:     Right lower leg: No edema.     Left lower leg: No edema.  Neurological:     Mental Status: She is alert.           Assessment & Plan:  She is following up on lower GI bleeding. She seems to be stable for now. We will check another CBC today. She is scheduled to see GI on 04-12-23. Her HTN and atrial fibrillation are stable. We spent a total of (35   ) minutes reviewing records and discussing these issues.  Christine Crane, MD

## 2023-01-10 ENCOUNTER — Other Ambulatory Visit: Payer: Self-pay

## 2023-01-10 MED ORDER — IRON (FERROUS SULFATE) 325 (65 FE) MG PO TABS: 325.0000 mg | ORAL_TABLET | Freq: Every day | ORAL | 3 refills | Status: DC

## 2023-01-12 ENCOUNTER — Encounter: Payer: Self-pay | Admitting: Family Medicine

## 2023-01-12 DIAGNOSIS — D62 Acute posthemorrhagic anemia: Secondary | ICD-10-CM

## 2023-01-12 NOTE — Telephone Encounter (Signed)
Spoke with pt daughter regarding pt lab results

## 2023-02-03 ENCOUNTER — Other Ambulatory Visit: Payer: Self-pay | Admitting: Family Medicine

## 2023-02-06 ENCOUNTER — Other Ambulatory Visit (INDEPENDENT_AMBULATORY_CARE_PROVIDER_SITE_OTHER): Payer: Medicare Other

## 2023-02-06 DIAGNOSIS — D62 Acute posthemorrhagic anemia: Secondary | ICD-10-CM | POA: Diagnosis not present

## 2023-02-06 LAB — CBC WITH DIFFERENTIAL/PLATELET
Basophils Absolute: 0.1 10*3/uL (ref 0.0–0.1)
Basophils Relative: 2.1 % (ref 0.0–3.0)
Eosinophils Absolute: 0.4 10*3/uL (ref 0.0–0.7)
Eosinophils Relative: 6.5 % — ABNORMAL HIGH (ref 0.0–5.0)
HCT: 34.9 % — ABNORMAL LOW (ref 36.0–46.0)
Hemoglobin: 11 g/dL — ABNORMAL LOW (ref 12.0–15.0)
Lymphocytes Relative: 15.7 % (ref 12.0–46.0)
Lymphs Abs: 1 10*3/uL (ref 0.7–4.0)
MCHC: 31.4 g/dL (ref 30.0–36.0)
MCV: 88 fl (ref 78.0–100.0)
Monocytes Absolute: 0.8 10*3/uL (ref 0.1–1.0)
Monocytes Relative: 12.9 % — ABNORMAL HIGH (ref 3.0–12.0)
Neutro Abs: 3.9 10*3/uL (ref 1.4–7.7)
Neutrophils Relative %: 62.8 % (ref 43.0–77.0)
Platelets: 225 10*3/uL (ref 150.0–400.0)
RBC: 3.97 Mil/uL (ref 3.87–5.11)
RDW: 21.8 % — ABNORMAL HIGH (ref 11.5–15.5)
WBC: 6.1 10*3/uL (ref 4.0–10.5)

## 2023-02-06 NOTE — Telephone Encounter (Signed)
Patient is requesting a refill on Atenolol.  Pharmacy- [facility]

## 2023-03-15 ENCOUNTER — Emergency Department (HOSPITAL_COMMUNITY): Payer: Medicare Other

## 2023-03-15 ENCOUNTER — Inpatient Hospital Stay (HOSPITAL_COMMUNITY)
Admission: EM | Admit: 2023-03-15 | Discharge: 2023-03-20 | DRG: 481 | Disposition: A | Discharge status: Skilled Nursing Facility | Payer: Medicare Other | Attending: Family Medicine | Admitting: Family Medicine

## 2023-03-15 ENCOUNTER — Encounter (HOSPITAL_COMMUNITY): Payer: Self-pay

## 2023-03-15 DIAGNOSIS — E872 Acidosis, unspecified: Secondary | ICD-10-CM | POA: Diagnosis present

## 2023-03-15 DIAGNOSIS — Z7401 Bed confinement status: Secondary | ICD-10-CM | POA: Diagnosis not present

## 2023-03-15 DIAGNOSIS — I129 Hypertensive chronic kidney disease with stage 1 through stage 4 chronic kidney disease, or unspecified chronic kidney disease: Secondary | ICD-10-CM | POA: Diagnosis present

## 2023-03-15 DIAGNOSIS — S72001A Fracture of unspecified part of neck of right femur, initial encounter for closed fracture: Secondary | ICD-10-CM | POA: Diagnosis not present

## 2023-03-15 DIAGNOSIS — Y92007 Garden or yard of unspecified non-institutional (private) residence as the place of occurrence of the external cause: Secondary | ICD-10-CM | POA: Diagnosis not present

## 2023-03-15 DIAGNOSIS — E785 Hyperlipidemia, unspecified: Secondary | ICD-10-CM | POA: Diagnosis present

## 2023-03-15 DIAGNOSIS — Z885 Allergy status to narcotic agent status: Secondary | ICD-10-CM

## 2023-03-15 DIAGNOSIS — N289 Disorder of kidney and ureter, unspecified: Secondary | ICD-10-CM | POA: Diagnosis not present

## 2023-03-15 DIAGNOSIS — M85851 Other specified disorders of bone density and structure, right thigh: Secondary | ICD-10-CM | POA: Diagnosis not present

## 2023-03-15 DIAGNOSIS — W010XXA Fall on same level from slipping, tripping and stumbling without subsequent striking against object, initial encounter: Secondary | ICD-10-CM | POA: Diagnosis present

## 2023-03-15 DIAGNOSIS — F05 Delirium due to known physiological condition: Secondary | ICD-10-CM | POA: Diagnosis not present

## 2023-03-15 DIAGNOSIS — N1832 Chronic kidney disease, stage 3b: Secondary | ICD-10-CM | POA: Diagnosis present

## 2023-03-15 DIAGNOSIS — K219 Gastro-esophageal reflux disease without esophagitis: Secondary | ICD-10-CM | POA: Diagnosis not present

## 2023-03-15 DIAGNOSIS — R278 Other lack of coordination: Secondary | ICD-10-CM | POA: Diagnosis not present

## 2023-03-15 DIAGNOSIS — D509 Iron deficiency anemia, unspecified: Secondary | ICD-10-CM | POA: Diagnosis present

## 2023-03-15 DIAGNOSIS — D649 Anemia, unspecified: Secondary | ICD-10-CM | POA: Diagnosis not present

## 2023-03-15 DIAGNOSIS — I739 Peripheral vascular disease, unspecified: Secondary | ICD-10-CM | POA: Diagnosis not present

## 2023-03-15 DIAGNOSIS — Z8673 Personal history of transient ischemic attack (TIA), and cerebral infarction without residual deficits: Secondary | ICD-10-CM

## 2023-03-15 DIAGNOSIS — S72141A Displaced intertrochanteric fracture of right femur, initial encounter for closed fracture: Secondary | ICD-10-CM | POA: Diagnosis not present

## 2023-03-15 DIAGNOSIS — R9431 Abnormal electrocardiogram [ECG] [EKG]: Secondary | ICD-10-CM | POA: Diagnosis not present

## 2023-03-15 DIAGNOSIS — R739 Hyperglycemia, unspecified: Secondary | ICD-10-CM | POA: Diagnosis present

## 2023-03-15 DIAGNOSIS — Z803 Family history of malignant neoplasm of breast: Secondary | ICD-10-CM

## 2023-03-15 DIAGNOSIS — S72141D Displaced intertrochanteric fracture of right femur, subsequent encounter for closed fracture with routine healing: Secondary | ICD-10-CM | POA: Diagnosis not present

## 2023-03-15 DIAGNOSIS — I6782 Cerebral ischemia: Secondary | ICD-10-CM | POA: Diagnosis not present

## 2023-03-15 DIAGNOSIS — M1711 Unilateral primary osteoarthritis, right knee: Secondary | ICD-10-CM | POA: Diagnosis not present

## 2023-03-15 DIAGNOSIS — S72144A Nondisplaced intertrochanteric fracture of right femur, initial encounter for closed fracture: Secondary | ICD-10-CM | POA: Diagnosis not present

## 2023-03-15 DIAGNOSIS — E538 Deficiency of other specified B group vitamins: Secondary | ICD-10-CM | POA: Diagnosis not present

## 2023-03-15 DIAGNOSIS — D696 Thrombocytopenia, unspecified: Secondary | ICD-10-CM | POA: Diagnosis not present

## 2023-03-15 DIAGNOSIS — I10 Essential (primary) hypertension: Secondary | ICD-10-CM | POA: Diagnosis not present

## 2023-03-15 DIAGNOSIS — Z881 Allergy status to other antibiotic agents status: Secondary | ICD-10-CM | POA: Diagnosis not present

## 2023-03-15 DIAGNOSIS — R531 Weakness: Secondary | ICD-10-CM | POA: Diagnosis not present

## 2023-03-15 DIAGNOSIS — I517 Cardiomegaly: Secondary | ICD-10-CM | POA: Diagnosis not present

## 2023-03-15 DIAGNOSIS — Z8249 Family history of ischemic heart disease and other diseases of the circulatory system: Secondary | ICD-10-CM | POA: Diagnosis not present

## 2023-03-15 DIAGNOSIS — Z833 Family history of diabetes mellitus: Secondary | ICD-10-CM

## 2023-03-15 DIAGNOSIS — D62 Acute posthemorrhagic anemia: Secondary | ICD-10-CM | POA: Diagnosis not present

## 2023-03-15 DIAGNOSIS — R2689 Other abnormalities of gait and mobility: Secondary | ICD-10-CM | POA: Diagnosis not present

## 2023-03-15 DIAGNOSIS — I6529 Occlusion and stenosis of unspecified carotid artery: Secondary | ICD-10-CM | POA: Diagnosis not present

## 2023-03-15 DIAGNOSIS — N179 Acute kidney failure, unspecified: Secondary | ICD-10-CM | POA: Diagnosis not present

## 2023-03-15 DIAGNOSIS — Z823 Family history of stroke: Secondary | ICD-10-CM

## 2023-03-15 DIAGNOSIS — Z90710 Acquired absence of both cervix and uterus: Secondary | ICD-10-CM | POA: Diagnosis not present

## 2023-03-15 DIAGNOSIS — I480 Paroxysmal atrial fibrillation: Secondary | ICD-10-CM | POA: Diagnosis not present

## 2023-03-15 DIAGNOSIS — J929 Pleural plaque without asbestos: Secondary | ICD-10-CM | POA: Diagnosis not present

## 2023-03-15 DIAGNOSIS — W19XXXA Unspecified fall, initial encounter: Secondary | ICD-10-CM | POA: Diagnosis not present

## 2023-03-15 DIAGNOSIS — Z9181 History of falling: Secondary | ICD-10-CM | POA: Diagnosis not present

## 2023-03-15 DIAGNOSIS — Z7901 Long term (current) use of anticoagulants: Secondary | ICD-10-CM

## 2023-03-15 DIAGNOSIS — Z888 Allergy status to other drugs, medicaments and biological substances status: Secondary | ICD-10-CM

## 2023-03-15 DIAGNOSIS — Z79899 Other long term (current) drug therapy: Secondary | ICD-10-CM | POA: Diagnosis not present

## 2023-03-15 DIAGNOSIS — M25559 Pain in unspecified hip: Secondary | ICD-10-CM | POA: Diagnosis not present

## 2023-03-15 DIAGNOSIS — S72144D Nondisplaced intertrochanteric fracture of right femur, subsequent encounter for closed fracture with routine healing: Secondary | ICD-10-CM | POA: Diagnosis not present

## 2023-03-15 DIAGNOSIS — F418 Other specified anxiety disorders: Secondary | ICD-10-CM | POA: Diagnosis not present

## 2023-03-15 DIAGNOSIS — M6281 Muscle weakness (generalized): Secondary | ICD-10-CM | POA: Diagnosis not present

## 2023-03-15 DIAGNOSIS — Z01818 Encounter for other preprocedural examination: Secondary | ICD-10-CM | POA: Diagnosis not present

## 2023-03-15 DIAGNOSIS — G319 Degenerative disease of nervous system, unspecified: Secondary | ICD-10-CM | POA: Diagnosis not present

## 2023-03-15 DIAGNOSIS — M1611 Unilateral primary osteoarthritis, right hip: Secondary | ICD-10-CM | POA: Diagnosis not present

## 2023-03-15 DIAGNOSIS — I700 Atherosclerosis of aorta: Secondary | ICD-10-CM | POA: Diagnosis not present

## 2023-03-15 HISTORY — DX: Encounter for other specified aftercare: Z51.89

## 2023-03-15 LAB — CBC WITH DIFFERENTIAL/PLATELET
Abs Immature Granulocytes: 0.08 10*3/uL — ABNORMAL HIGH (ref 0.00–0.07)
Basophils Absolute: 0 10*3/uL (ref 0.0–0.1)
Basophils Relative: 0 %
Eosinophils Absolute: 0 10*3/uL (ref 0.0–0.5)
Eosinophils Relative: 0 %
HCT: 34.2 % — ABNORMAL LOW (ref 36.0–46.0)
Hemoglobin: 10.9 g/dL — ABNORMAL LOW (ref 12.0–15.0)
Immature Granulocytes: 1 %
Lymphocytes Relative: 3 %
Lymphs Abs: 0.5 10*3/uL — ABNORMAL LOW (ref 0.7–4.0)
MCH: 28.4 pg (ref 26.0–34.0)
MCHC: 31.9 g/dL (ref 30.0–36.0)
MCV: 89.1 fL (ref 80.0–100.0)
Monocytes Absolute: 1 10*3/uL (ref 0.1–1.0)
Monocytes Relative: 7 %
Neutro Abs: 13.8 10*3/uL — ABNORMAL HIGH (ref 1.7–7.7)
Neutrophils Relative %: 89 %
Platelets: 201 10*3/uL (ref 150–400)
RBC: 3.84 MIL/uL — ABNORMAL LOW (ref 3.87–5.11)
RDW: 18.1 % — ABNORMAL HIGH (ref 11.5–15.5)
WBC: 15.4 10*3/uL — ABNORMAL HIGH (ref 4.0–10.5)
nRBC: 0 % (ref 0.0–0.2)

## 2023-03-15 LAB — BASIC METABOLIC PANEL WITH GFR
Anion gap: 16 — ABNORMAL HIGH (ref 5–15)
BUN: 22 mg/dL (ref 8–23)
CO2: 21 mmol/L — ABNORMAL LOW (ref 22–32)
Calcium: 9.2 mg/dL (ref 8.9–10.3)
Chloride: 100 mmol/L (ref 98–111)
Creatinine, Ser: 1.22 mg/dL — ABNORMAL HIGH (ref 0.44–1.00)
GFR, Estimated: 40 mL/min — ABNORMAL LOW
Glucose, Bld: 207 mg/dL — ABNORMAL HIGH (ref 70–99)
Potassium: 4.4 mmol/L (ref 3.5–5.1)
Sodium: 137 mmol/L (ref 135–145)

## 2023-03-15 LAB — PROTIME-INR
INR: 1.3 — ABNORMAL HIGH (ref 0.8–1.2)
Prothrombin Time: 16.4 s — ABNORMAL HIGH (ref 11.4–15.2)

## 2023-03-15 MED ORDER — ONDANSETRON HCL 4 MG/2ML IJ SOLN: 4.0000 mg | Freq: Once | INTRAMUSCULAR | Status: AC

## 2023-03-15 MED ORDER — FENTANYL CITRATE PF 50 MCG/ML IJ SOSY
50.0000 ug | PREFILLED_SYRINGE | INTRAMUSCULAR | Status: AC | PRN
  Administered 2023-03-15 – 2023-03-16 (×2): 50 ug via INTRAVENOUS
  Filled 2023-03-15 (×2): qty 1

## 2023-03-15 MED ORDER — ONDANSETRON HCL 4 MG/2ML IJ SOLN
INTRAMUSCULAR | Status: AC
  Administered 2023-03-15: 4 mg via INTRAVENOUS
  Filled 2023-03-15: qty 2

## 2023-03-15 MED ORDER — SODIUM CHLORIDE 0.9 % IV SOLN: INTRAVENOUS | Status: DC

## 2023-03-15 NOTE — ED Notes (Signed)
Patient transported to CT 

## 2023-03-15 NOTE — ED Notes (Signed)
Patient transported to X-ray 

## 2023-03-15 NOTE — ED Triage Notes (Addendum)
Patient brought in via EMS, fell while trimming bushes at 3pm son found her at 9pm, Negative LOC, possible poison Ivy, HX Afib, denies neck back pain, patient on blood thinners  BP 178/80

## 2023-03-15 NOTE — ED Provider Notes (Signed)
Care assumed from Dr. Charm Barges, patient with right hip intertrochanteric fracture, anticoagulated on apixaban waiting official reading of x-rays and CT scans as well as labs before admitting.  CT scans of head and cervical spine show no acute injury, chest x-ray shows no acute process, hip x-ray shows intertrochanteric fracture of right hip.  Have independently viewed the images, and agree with radiologist interpretation.  I reviewed and interpreted her laboratory tests, and my interpretation is mild renal insufficiency but creatinine not significantly changed from baseline, stable anemia.  I have discussed case with Dr. Christell Constant of orthopedics who states that he will see the patient in consultation, surgery not likely to be done until late tomorrow.  I have discussed case with Dr. Emmit Pomfret of [facility], who agrees to admit the patient.  Results for orders placed or performed during the hospital encounter of 03/15/23  Basic metabolic panel  Result Value Ref Range   Sodium 137 135 - 145 mmol/L   Potassium 4.4 3.5 - 5.1 mmol/L   Chloride 100 98 - 111 mmol/L   CO2 21 (L) 22 - 32 mmol/L   Glucose, Bld 207 (H) 70 - 99 mg/dL   BUN 22 8 - 23 mg/dL   Creatinine, Ser 4.09 (H) 0.44 - 1.00 mg/dL   Calcium 9.2 8.9 - 81.1 mg/dL   GFR, Estimated 40 (L) >60 mL/min   Anion gap 16 (H) 5 - 15  CBC with Differential  Result Value Ref Range   WBC 15.4 (H) 4.0 - 10.5 K/uL   RBC 3.84 (L) 3.87 - 5.11 MIL/uL   Hemoglobin 10.9 (L) 12.0 - 15.0 g/dL   HCT 91.4 (L) 78.2 - 95.6 %   MCV 89.1 80.0 - 100.0 fL   MCH 28.4 26.0 - 34.0 pg   MCHC 31.9 30.0 - 36.0 g/dL   RDW 21.3 (H) 08.6 - 57.8 %   Platelets 201 150 - 400 K/uL   nRBC 0.0 0.0 - 0.2 %   Neutrophils Relative % 89 %   Neutro Abs 13.8 (H) 1.7 - 7.7 K/uL   Lymphocytes Relative 3 %   Lymphs Abs 0.5 (L) 0.7 - 4.0 K/uL   Monocytes Relative 7 %   Monocytes Absolute 1.0 0.1 - 1.0 K/uL   Eosinophils Relative 0 %   Eosinophils Absolute 0.0 0.0 - 0.5 K/uL    Basophils Relative 0 %   Basophils Absolute 0.0 0.0 - 0.1 K/uL   Immature Granulocytes 1 %   Abs Immature Granulocytes 0.08 (H) 0.00 - 0.07 K/uL  Protime-INR  Result Value Ref Range   Prothrombin Time 16.4 (H) 11.4 - 15.2 seconds   INR 1.3 (H) 0.8 - 1.2  Type and screen  Result Value Ref Range   ABO/RH(D) PENDING    Antibody Screen PENDING    Sample Expiration      03/18/2023,2359 Performed at [facility], [address], [place], [place] [address]    CT HEAD WO CONTRAST  Result Date: 03/15/2023 CLINICAL DATA:  Head trauma fall, hip fracture EXAM: CT HEAD WITHOUT CONTRAST CT CERVICAL SPINE WITHOUT CONTRAST TECHNIQUE: Multidetector CT imaging of the head and cervical spine was performed following the standard protocol without intravenous contrast. Multiplanar CT image reconstructions of the cervical spine were also generated. RADIATION DOSE REDUCTION: This exam was performed according to the departmental dose-optimization program which includes automated exposure control, adjustment of the mA and/or kV according to patient size and/or use of iterative reconstruction technique. COMPARISON:  CT brain 06/03/2022 FINDINGS:  CT HEAD FINDINGS Brain: No acute territorial infarction or hemorrhage is visualized. Stable partially calcified 1.9 cm left convexity extra-axial mass most likely representing calcified hemangioma. Atrophy and chronic small vessel ischemic changes of the white matter. Small chronic infarct high left frontal vertex. 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 Stable ventricle size Vascular: No hyperdense vessels.  Carotid vascular calcification Skull: Normal. Negative for fracture or focal lesion. Sinuses/Orbits: Mucosal thickening in the sinuses Other: None CT CERVICAL SPINE FINDINGS Alignment: No subluxation.  Facet alignment within normal limits. Skull base and vertebrae: No acute fracture. No primary bone lesion or focal pathologic process. Soft tissues and spinal  canal: No prevertebral fluid or swelling. No visible canal hematoma. Disc levels: Moderate disc space narrowing and degenerative change C4 through C7. Facet degenerative changes at multiple levels with foraminal narrowing Upper chest: Negative. Other: None IMPRESSION: 1. No CT evidence for acute intracranial abnormality. Atrophy and chronic small vessel ischemic changes of the white matter. Chronic infarcts as above. 2. Stable 1.9 cm calcified left convexity meningioma. 3. Degenerative changes of the cervical spine. No acute osseous abnormality. Electronically Signed   By: Jasmine Pang M.D.   On: 03/15/2023 23:14   CT CERVICAL SPINE WO CONTRAST  Result Date: 03/15/2023 CLINICAL DATA:  Head trauma fall, hip fracture EXAM: CT HEAD WITHOUT CONTRAST CT CERVICAL SPINE WITHOUT CONTRAST TECHNIQUE: Multidetector CT imaging of the head and cervical spine was performed following the standard protocol without intravenous contrast. Multiplanar CT image reconstructions of the cervical spine were also generated. RADIATION DOSE REDUCTION: This exam was performed according to the departmental dose-optimization program which includes automated exposure control, adjustment of the mA and/or kV according to patient size and/or use of iterative reconstruction technique. COMPARISON:  CT brain 06/03/2022 FINDINGS: CT HEAD FINDINGS Brain: No acute territorial infarction or hemorrhage is visualized. Stable partially calcified 1.9 cm left convexity extra-axial mass most likely representing calcified hemangioma. Atrophy and chronic small vessel ischemic changes of the white matter. Small chronic infarct high left frontal vertex. 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 Stable ventricle size Vascular: No hyperdense vessels.  Carotid vascular calcification Skull: Normal. Negative for fracture or focal lesion. Sinuses/Orbits: Mucosal thickening in the sinuses Other: None CT CERVICAL SPINE FINDINGS Alignment: No subluxation.   Facet alignment within normal limits. Skull base and vertebrae: No acute fracture. No primary bone lesion or focal pathologic process. Soft tissues and spinal canal: No prevertebral fluid or swelling. No visible canal hematoma. Disc levels: Moderate disc space narrowing and degenerative change C4 through C7. Facet degenerative changes at multiple levels with foraminal narrowing Upper chest: Negative. Other: None IMPRESSION: 1. No CT evidence for acute intracranial abnormality. Atrophy and chronic small vessel ischemic changes of the white matter. Chronic infarcts as above. 2. Stable 1.9 cm calcified left convexity meningioma. 3. Degenerative changes of the cervical spine. No acute osseous abnormality. Electronically Signed   By: Jasmine Pang M.D.   On: 03/15/2023 23:14   DG Hip Unilat With Pelvis 2-3 Views Right  Result Date: 03/15/2023 CLINICAL DATA:  Fall with hip pain EXAM: DG HIP (WITH OR WITHOUT PELVIS) 2-3V RIGHT COMPARISON:  05/03/2022 FINDINGS: SI joints show mild degenerative change. Pubic symphysis and rami are intact. Acute comminuted and displaced right intertrochanteric fracture. No femoral head dislocation IMPRESSION: Acute comminuted and displaced right intertrochanteric fracture. Electronically Signed   By: Jasmine Pang M.D.   On: 03/15/2023 23:07   DG Chest Port 1 View  Result Date: 03/15/2023 CLINICAL DATA:  Fall with hip pain EXAM: PORTABLE CHEST 1 VIEW COMPARISON:  05/03/2022 FINDINGS: Rounded metallic density over the left neck. No acute airspace disease or pleural effusion. Mild diffuse coarse chronic appearing interstitial opacity. Borderline cardiomegaly with aortic atherosclerosis. Apical pleural thickening. No pneumothorax IMPRESSION: No active disease. Chronic appearing interstitial changes. Round metallic density over left neck, correlate with direct inspection Electronically Signed   By: Jasmine Pang M.D.   On: 03/15/2023 23:06      Dione Booze, MD 03/16/23 0025

## 2023-03-15 NOTE — ED Provider Notes (Signed)
[place] EMERGENCY DEPARTMENT AT [facility] Provider Note   CSN: [id number] Arrival date & time: 03/15/23  2215     History  No chief complaint on file.   Christine Middleton is a 89 y.o. female.  She is brought in by EMS as a level 2 trauma activation fall on blood thinners.  She was reportedly doing outside yard work when she fell around 3 PM.  It sounds like she might of laid on the ground until family found her around 9 PM.  She is complaining of severe right hip pain.  She denies any loss of consciousness.  She is on blood thinners for A-fib.  The history is provided by the patient and the EMS personnel.  Fall This is a new problem. The current episode started 6 to 12 hours ago. The problem has not changed since onset.Pertinent negatives include no chest pain, no abdominal pain, no headaches and no shortness of breath. The symptoms are aggravated by bending and twisting. Nothing relieves the symptoms. She has tried rest for the symptoms. The treatment provided no relief.       Home Medications Prior to Admission medications   Medication Sig Start Date End Date Taking? Authorizing Provider  Iron, Ferrous Sulfate, 325 (65 Fe) MG TABS Take 325 mg by mouth daily. 01/10/23   Nelwyn Salisbury, MD  acetaminophen (TYLENOL) 650 MG CR tablet Take 650 mg by mouth every 8 (eight) hours as needed for pain.    [provider]  apixaban (ELIQUIS) 2.5 MG TABS tablet Take 1 tablet (2.5 mg total) by mouth 2 (two) times daily. 01/02/23   Leroy Sea, MD  atenolol (TENORMIN) 50 MG tablet TAKE 1 TABLET BY MOUTH EVERY DAY 02/06/23   Nelwyn Salisbury, MD  docusate sodium (COLACE) 100 MG capsule Take 2 capsules (200 mg total) by mouth 2 (two) times daily. 12/23/22   Leroy Sea, MD  hydrocortisone (ANUSOL-HC) 25 MG suppository Place 1 suppository (25 mg total) rectally 2 (two) times daily. 12/23/22   Leroy Sea, MD  losartan (COZAAR) 25 MG tablet Take 1 tablet (25 mg total) by  mouth every evening. 11/14/22   Nelwyn Salisbury, MD  mirtazapine (REMERON) 15 MG tablet Take 1/2 (7.5 mg) tablet by mouth at bedtime 11/14/22   Nelwyn Salisbury, MD  pantoprazole (PROTONIX) 40 MG tablet TAKE 1 TABLET BY MOUTH EVERY DAY 02/06/23   Nelwyn Salisbury, MD  polyethylene glycol (MIRALAX / GLYCOLAX) 17 g packet Take 17 g by mouth daily. Patient taking differently: Take 17 g by mouth as needed. 07/12/19   Jae Dire, MD  VITAMIN D PO Take 100 Units by mouth.    [provider]      Allergies    Amoxicillin, Codeine, Lipitor [atorvastatin], Lisinopril, Nitrofurantoin, and Tramadol    Review of Systems   Review of Systems  Constitutional:  Negative for fever.  Eyes:  Negative for visual disturbance.  Respiratory:  Negative for shortness of breath.   Cardiovascular:  Negative for chest pain.  Gastrointestinal:  Negative for abdominal pain.  Musculoskeletal:  Negative for back pain and neck pain.  Neurological:  Negative for headaches.    Physical Exam Updated Vital Signs BP (!) 121/48 (BP Location: Right Arm)   Pulse 71   Temp 98.3 F (36.8 C) (Oral)   Resp 18   Ht 5\' 2"  (1.575 m)   Wt 61.7 kg   SpO2 94%   BMI 24.88  kg/m  Physical Exam Vitals and nursing note reviewed.  Constitutional:      General: She is not in acute distress.    Appearance: Normal appearance. She is well-developed.  HENT:     Head: Normocephalic and atraumatic.  Eyes:     Conjunctiva/sclera: Conjunctivae normal.  Neck:     Comments: In cervical collar trach midline Cardiovascular:     Rate and Rhythm: Normal rate and regular rhythm.     Heart sounds: No murmur heard. Pulmonary:     Effort: Pulmonary effort is normal. No respiratory distress.     Breath sounds: Normal breath sounds.  Abdominal:     Palpations: Abdomen is soft.     Tenderness: There is no abdominal tenderness. There is no guarding or rebound.  Musculoskeletal:        General: Tenderness and signs of injury present.      Cervical back: No tenderness.     Comments: Upper extremity full range of motion without any pain or limitations.  Right lower extremity is shortened and externally rotated, tender at her right hip and unable to range of motion due to pain.  Knee and ankle appear nontender.  Left lower extremity full range of motion without any pain or limitations.  Skin:    General: Skin is warm and dry.     Capillary Refill: Capillary refill takes less than 2 seconds.  Neurological:     General: No focal deficit present.     Mental Status: She is alert.     Sensory: No sensory deficit.     Motor: No weakness.     ED Results / Procedures / Treatments   Labs (all labs ordered are listed, but only abnormal results are displayed) Labs Reviewed  BASIC METABOLIC PANEL - Abnormal; Notable for the following components:      Result Value   CO2 21 (*)    Glucose, Bld 207 (*)    Creatinine, Ser 1.22 (*)    GFR, Estimated 40 (*)    Anion gap 16 (*)    All other components within normal limits  CBC WITH DIFFERENTIAL/PLATELET - Abnormal; Notable for the following components:   WBC 15.4 (*)    RBC 3.84 (*)    Hemoglobin 10.9 (*)    HCT 34.2 (*)    RDW 18.1 (*)    Neutro Abs 13.8 (*)    Lymphs Abs 0.5 (*)    Abs Immature Granulocytes 0.08 (*)    All other components within normal limits  PROTIME-INR - Abnormal; Notable for the following components:   Prothrombin Time 16.4 (*)    INR 1.3 (*)    All other components within normal limits  GLUCOSE, CAPILLARY - Abnormal; Notable for the following components:   Glucose-Capillary 168 (*)    All other components within normal limits  GLUCOSE, CAPILLARY - Abnormal; Notable for the following components:   Glucose-Capillary 121 (*)    All other components within normal limits  GLUCOSE, CAPILLARY - Abnormal; Notable for the following components:   Glucose-Capillary 105 (*)    All other components within normal limits  MRSA NEXT GEN BY PCR, NASAL  HEMOGLOBIN  A1C  HEPARIN LEVEL (UNFRACTIONATED)  APTT  CBC  BASIC METABOLIC PANEL  TYPE AND SCREEN    EKG EKG Interpretation Date/Time:  Thursday March 15 2023 22:58:19 EDT Ventricular Rate:  90 PR Interval:  209 QRS Duration:  100 QT Interval:  379 QTC Calculation: 464 R Axis:   -44  Text Interpretation: Sinus rhythm Atrial premature complex Left anterior fascicular block Probable lateral infarct, old Confirmed by Meridee Score [phone]) on 03/15/2023 11:00:10 PM  Radiology DG Knee 1-2 Views Right  Result Date: 03/16/2023 CLINICAL DATA:  Level 2 trauma, hip fracture. EXAM: RIGHT KNEE - 1-2 VIEW COMPARISON:  None. FINDINGS: No acute fracture or dislocation. Mild degenerative changes are present in the medial and patellofemoral compartments. No joint effusion. Vascular calcifications are noted in the soft tissues. IMPRESSION: No acute fracture or dislocation. Electronically Signed   By: Thornell Sartorius M.D.   On: 03/16/2023 00:47   CT HEAD WO CONTRAST  Result Date: 03/15/2023 CLINICAL DATA:  Head trauma fall, hip fracture EXAM: CT HEAD WITHOUT CONTRAST CT CERVICAL SPINE WITHOUT CONTRAST TECHNIQUE: Multidetector CT imaging of the head and cervical spine was performed following the standard protocol without intravenous contrast. Multiplanar CT image reconstructions of the cervical spine were also generated. RADIATION DOSE REDUCTION: This exam was performed according to the departmental dose-optimization program which includes automated exposure control, adjustment of the mA and/or kV according to patient size and/or use of iterative reconstruction technique. COMPARISON:  CT brain 06/03/2022 FINDINGS: CT HEAD FINDINGS Brain: No acute territorial infarction or hemorrhage is visualized. Stable partially calcified 1.9 cm left convexity extra-axial mass most likely representing calcified hemangioma. Atrophy and chronic small vessel ischemic changes of the white matter. Small chronic infarct high left frontal  vertex. 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 Stable ventricle size Vascular: No hyperdense vessels.  Carotid vascular calcification Skull: Normal. Negative for fracture or focal lesion. Sinuses/Orbits: Mucosal thickening in the sinuses Other: None CT CERVICAL SPINE FINDINGS Alignment: No subluxation.  Facet alignment within normal limits. Skull base and vertebrae: No acute fracture. No primary bone lesion or focal pathologic process. Soft tissues and spinal canal: No prevertebral fluid or swelling. No visible canal hematoma. Disc levels: Moderate disc space narrowing and degenerative change C4 through C7. Facet degenerative changes at multiple levels with foraminal narrowing Upper chest: Negative. Other: None IMPRESSION: 1. No CT evidence for acute intracranial abnormality. Atrophy and chronic small vessel ischemic changes of the white matter. Chronic infarcts as above. 2. Stable 1.9 cm calcified left convexity meningioma. 3. Degenerative changes of the cervical spine. No acute osseous abnormality. Electronically Signed   By: Jasmine Pang M.D.   On: 03/15/2023 23:14   CT CERVICAL SPINE WO CONTRAST  Result Date: 03/15/2023 CLINICAL DATA:  Head trauma fall, hip fracture EXAM: CT HEAD WITHOUT CONTRAST CT CERVICAL SPINE WITHOUT CONTRAST TECHNIQUE: Multidetector CT imaging of the head and cervical spine was performed following the standard protocol without intravenous contrast. Multiplanar CT image reconstructions of the cervical spine were also generated. RADIATION DOSE REDUCTION: This exam was performed according to the departmental dose-optimization program which includes automated exposure control, adjustment of the mA and/or kV according to patient size and/or use of iterative reconstruction technique. COMPARISON:  CT brain 06/03/2022 FINDINGS: CT HEAD FINDINGS Brain: No acute territorial infarction or hemorrhage is visualized. Stable partially calcified 1.9 cm left convexity extra-axial mass  most likely representing calcified hemangioma. Atrophy and chronic small vessel ischemic changes of the white matter. Small chronic infarct high left frontal vertex. 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 Stable ventricle size Vascular: No hyperdense vessels.  Carotid vascular calcification Skull: Normal. Negative for fracture or focal lesion. Sinuses/Orbits: Mucosal thickening in the sinuses Other: None CT CERVICAL SPINE FINDINGS Alignment: No subluxation.  Facet alignment within normal limits. Skull base and vertebrae: No acute fracture.  No primary bone lesion or focal pathologic process. Soft tissues and spinal canal: No prevertebral fluid or swelling. No visible canal hematoma. Disc levels: Moderate disc space narrowing and degenerative change C4 through C7. Facet degenerative changes at multiple levels with foraminal narrowing Upper chest: Negative. Other: None IMPRESSION: 1. No CT evidence for acute intracranial abnormality. Atrophy and chronic small vessel ischemic changes of the white matter. Chronic infarcts as above. 2. Stable 1.9 cm calcified left convexity meningioma. 3. Degenerative changes of the cervical spine. No acute osseous abnormality. Electronically Signed   By: Jasmine Pang M.D.   On: 03/15/2023 23:14   DG Hip Unilat With Pelvis 2-3 Views Right  Result Date: 03/15/2023 CLINICAL DATA:  Fall with hip pain EXAM: DG HIP (WITH OR WITHOUT PELVIS) 2-3V RIGHT COMPARISON:  05/03/2022 FINDINGS: SI joints show mild degenerative change. Pubic symphysis and rami are intact. Acute comminuted and displaced right intertrochanteric fracture. No femoral head dislocation IMPRESSION: Acute comminuted and displaced right intertrochanteric fracture. Electronically Signed   By: Jasmine Pang M.D.   On: 03/15/2023 23:07   DG Chest Port 1 View  Result Date: 03/15/2023 CLINICAL DATA:  Fall with hip pain EXAM: PORTABLE CHEST 1 VIEW COMPARISON:  05/03/2022 FINDINGS: Rounded metallic density over  the left neck. No acute airspace disease or pleural effusion. Mild diffuse coarse chronic appearing interstitial opacity. Borderline cardiomegaly with aortic atherosclerosis. Apical pleural thickening. No pneumothorax IMPRESSION: No active disease. Chronic appearing interstitial changes. Round metallic density over left neck, correlate with direct inspection Electronically Signed   By: Jasmine Pang M.D.   On: 03/15/2023 23:06    Procedures Procedures    Medications Ordered in ED Medications  0.9 %  sodium chloride infusion (0 mLs Intravenous Hold 03/16/23 0137)  atenolol (TENORMIN) tablet 50 mg (50 mg Oral Given 03/16/23 0956)  losartan (COZAAR) tablet 25 mg (has no administration in time range)  mirtazapine (REMERON) tablet 7.5 mg (has no administration in time range)  pantoprazole (PROTONIX) EC tablet 40 mg (40 mg Oral Not Given 03/16/23 0855)  HYDROcodone-acetaminophen (NORCO/VICODIN) 5-325 MG per tablet 1-2 tablet (2 tablets Oral Given 03/16/23 0957)  senna-docusate (Senokot-S) tablet 1 tablet (has no administration in time range)  bisacodyl (DULCOLAX) EC tablet 5 mg (has no administration in time range)  magnesium citrate solution 1 Bottle (has no administration in time range)  lactated ringers infusion ( Intravenous New Bag/Given 03/16/23 0111)  heparin ADULT infusion 100 units/mL (25000 units/286mL) (0 Units/hr Intravenous Stopped 03/16/23 0500)  ferrous sulfate tablet 325 mg (325 mg Oral Not Given 03/16/23 0855)  tranexamic acid (CYKLOKAPRON) IVPB 1,000 mg (has no administration in time range)  ceFAZolin (ANCEF) IVPB 2g/100 mL premix (has no administration in time range)  feeding supplement (ENSURE ENLIVE / ENSURE PLUS) liquid 237 mL (has no administration in time range)  multivitamin with minerals tablet 1 tablet (has no administration in time range)  fentaNYL (SUBLIMAZE) injection 50 mcg (50 mcg Intravenous Given 03/16/23 0151)  ondansetron (ZOFRAN) injection 4 mg (4 mg Intravenous Given  03/15/23 2251)    ED Course/ Medical Decision Making/ A&P Clinical Course as of 03/16/23 1224  Thu Mar 15, 2023  2228 Patient's chest x-ray does not show any acute traumatic findings.  Patient's pelvis and right hip are x-ray shows an intertrochanteric fracture on the right. [MB]    Clinical Course User Index [MB] Terrilee Files, MD  Medical Decision Making Amount and/or Complexity of Data Reviewed Labs: ordered. Radiology: ordered.  Risk Prescription drug management. Decision regarding hospitalization.   This patient complains of fall right hip pain; this involves an extensive number of treatment Options and is a complaint that carries with it a high risk of complications and morbidity. The differential includes fracture, contusion, dislocation, bleed  I ordered, reviewed and interpreted labs, which included CBC with elevated white count low hemoglobin, stable from prior, chemistries with slightly elevated creatinine, I ordered medication IV pain medicine and fluids and reviewed PMP when indicated. I ordered imaging studies which included chest pelvis right hip x-ray, CT head and cervical spine and I independently    visualized and interpreted imaging which showed intertrochanteric fracture on right Additional history obtained from EMS Previous records obtained and reviewed in epic no recent admissions I consulted orthopedics Dr. Christell Constant and discussed lab and imaging findings and discussed disposition.  Cardiac monitoring reviewed, sinus rhythm Social determinants considered, no significant barriers Critical Interventions: None  After the interventions stated above, I reevaluated the patient and found patient to be neurovascularly intact Admission and further testing considered, patient's care signed out to Dr. Preston Fleeting to follow-up on final readings of imaging and will need discussion with orthopedics and medicine for admission.  Patient updated on  status.  No family at bedside.         Final Clinical Impression(s) / ED Diagnoses Final diagnoses:  Closed intertrochanteric fracture of right hip, initial encounter (HCC)  Normochromic normocytic anemia  Renal insufficiency  Chronic anticoagulation    Rx / DC Orders ED Discharge Orders     None         Terrilee Files, MD 03/16/23 1227

## 2023-03-15 NOTE — Plan of Care (Signed)
Orthopedic Plan of Care Note  Patient has a right intertrochanteric hip fracture that will require operative stabilization. Admit to medicine for optimization prior to surgery. NPO starting at 2am. Hold anticoagulation in anticipation of surgery. Non weight bearing right lower extremity. Full consult note to follow in the morning.  London Sheer, MD Orthopedic Surgeon

## 2023-03-16 ENCOUNTER — Emergency Department (HOSPITAL_COMMUNITY): Payer: Medicare Other

## 2023-03-16 ENCOUNTER — Inpatient Hospital Stay (HOSPITAL_COMMUNITY): Payer: Medicare Other

## 2023-03-16 DIAGNOSIS — I480 Paroxysmal atrial fibrillation: Secondary | ICD-10-CM | POA: Diagnosis present

## 2023-03-16 DIAGNOSIS — I10 Essential (primary) hypertension: Secondary | ICD-10-CM | POA: Diagnosis not present

## 2023-03-16 DIAGNOSIS — Z90710 Acquired absence of both cervix and uterus: Secondary | ICD-10-CM | POA: Diagnosis not present

## 2023-03-16 DIAGNOSIS — N179 Acute kidney failure, unspecified: Secondary | ICD-10-CM | POA: Diagnosis present

## 2023-03-16 DIAGNOSIS — Z803 Family history of malignant neoplasm of breast: Secondary | ICD-10-CM | POA: Diagnosis not present

## 2023-03-16 DIAGNOSIS — Z8249 Family history of ischemic heart disease and other diseases of the circulatory system: Secondary | ICD-10-CM | POA: Diagnosis not present

## 2023-03-16 DIAGNOSIS — E872 Acidosis, unspecified: Secondary | ICD-10-CM | POA: Diagnosis present

## 2023-03-16 DIAGNOSIS — Z79899 Other long term (current) drug therapy: Secondary | ICD-10-CM | POA: Diagnosis not present

## 2023-03-16 DIAGNOSIS — N289 Disorder of kidney and ureter, unspecified: Secondary | ICD-10-CM | POA: Diagnosis not present

## 2023-03-16 DIAGNOSIS — F418 Other specified anxiety disorders: Secondary | ICD-10-CM | POA: Diagnosis not present

## 2023-03-16 DIAGNOSIS — Z7901 Long term (current) use of anticoagulants: Secondary | ICD-10-CM | POA: Diagnosis not present

## 2023-03-16 DIAGNOSIS — S72144D Nondisplaced intertrochanteric fracture of right femur, subsequent encounter for closed fracture with routine healing: Secondary | ICD-10-CM | POA: Diagnosis not present

## 2023-03-16 DIAGNOSIS — Z01818 Encounter for other preprocedural examination: Secondary | ICD-10-CM | POA: Diagnosis not present

## 2023-03-16 DIAGNOSIS — D696 Thrombocytopenia, unspecified: Secondary | ICD-10-CM | POA: Diagnosis not present

## 2023-03-16 DIAGNOSIS — N1832 Chronic kidney disease, stage 3b: Secondary | ICD-10-CM | POA: Diagnosis present

## 2023-03-16 DIAGNOSIS — Z881 Allergy status to other antibiotic agents status: Secondary | ICD-10-CM | POA: Diagnosis not present

## 2023-03-16 DIAGNOSIS — S72141A Displaced intertrochanteric fracture of right femur, initial encounter for closed fracture: Secondary | ICD-10-CM | POA: Diagnosis present

## 2023-03-16 DIAGNOSIS — Z9181 History of falling: Secondary | ICD-10-CM | POA: Diagnosis not present

## 2023-03-16 DIAGNOSIS — E785 Hyperlipidemia, unspecified: Secondary | ICD-10-CM | POA: Diagnosis present

## 2023-03-16 DIAGNOSIS — F05 Delirium due to known physiological condition: Secondary | ICD-10-CM | POA: Diagnosis not present

## 2023-03-16 DIAGNOSIS — Z8673 Personal history of transient ischemic attack (TIA), and cerebral infarction without residual deficits: Secondary | ICD-10-CM | POA: Diagnosis not present

## 2023-03-16 DIAGNOSIS — W010XXA Fall on same level from slipping, tripping and stumbling without subsequent striking against object, initial encounter: Secondary | ICD-10-CM | POA: Diagnosis present

## 2023-03-16 DIAGNOSIS — D62 Acute posthemorrhagic anemia: Secondary | ICD-10-CM | POA: Diagnosis not present

## 2023-03-16 DIAGNOSIS — Y92007 Garden or yard of unspecified non-institutional (private) residence as the place of occurrence of the external cause: Secondary | ICD-10-CM | POA: Diagnosis not present

## 2023-03-16 DIAGNOSIS — I129 Hypertensive chronic kidney disease with stage 1 through stage 4 chronic kidney disease, or unspecified chronic kidney disease: Secondary | ICD-10-CM | POA: Diagnosis present

## 2023-03-16 DIAGNOSIS — D509 Iron deficiency anemia, unspecified: Secondary | ICD-10-CM | POA: Diagnosis present

## 2023-03-16 DIAGNOSIS — M6281 Muscle weakness (generalized): Secondary | ICD-10-CM | POA: Diagnosis not present

## 2023-03-16 DIAGNOSIS — Z833 Family history of diabetes mellitus: Secondary | ICD-10-CM | POA: Diagnosis not present

## 2023-03-16 DIAGNOSIS — E538 Deficiency of other specified B group vitamins: Secondary | ICD-10-CM | POA: Diagnosis present

## 2023-03-16 DIAGNOSIS — Z885 Allergy status to narcotic agent status: Secondary | ICD-10-CM | POA: Diagnosis not present

## 2023-03-16 DIAGNOSIS — R739 Hyperglycemia, unspecified: Secondary | ICD-10-CM | POA: Diagnosis present

## 2023-03-16 DIAGNOSIS — K219 Gastro-esophageal reflux disease without esophagitis: Secondary | ICD-10-CM | POA: Diagnosis present

## 2023-03-16 DIAGNOSIS — Z823 Family history of stroke: Secondary | ICD-10-CM | POA: Diagnosis not present

## 2023-03-16 DIAGNOSIS — R278 Other lack of coordination: Secondary | ICD-10-CM | POA: Diagnosis not present

## 2023-03-16 LAB — MRSA NEXT GEN BY PCR, NASAL: MRSA by PCR Next Gen: NOT DETECTED

## 2023-03-16 LAB — GLUCOSE, CAPILLARY
Glucose-Capillary: 168 mg/dL — ABNORMAL HIGH (ref 70–99)
Glucose-Capillary: 121 mg/dL — ABNORMAL HIGH (ref 70–99)
Glucose-Capillary: 105 mg/dL — ABNORMAL HIGH (ref 70–99)
Glucose-Capillary: 123 mg/dL — ABNORMAL HIGH (ref 70–99)
Glucose-Capillary: 141 mg/dL — ABNORMAL HIGH (ref 70–99)

## 2023-03-16 LAB — CBC
HCT: 29.9 % — ABNORMAL LOW (ref 36.0–46.0)
Hemoglobin: 9.7 g/dL — ABNORMAL LOW (ref 12.0–15.0)
MCH: 28.6 pg (ref 26.0–34.0)
MCHC: 32.4 g/dL (ref 30.0–36.0)
MCV: 88.2 fL (ref 80.0–100.0)
Platelets: 177 10*3/uL (ref 150–400)
RBC: 3.39 MIL/uL — ABNORMAL LOW (ref 3.87–5.11)
RDW: 18.2 % — ABNORMAL HIGH (ref 11.5–15.5)
WBC: 10.4 10*3/uL (ref 4.0–10.5)
nRBC: 0 % (ref 0.0–0.2)

## 2023-03-16 LAB — BASIC METABOLIC PANEL
Anion gap: 10 (ref 5–15)
BUN: 19 mg/dL (ref 8–23)
CO2: 25 mmol/L (ref 22–32)
Calcium: 8.5 mg/dL — ABNORMAL LOW (ref 8.9–10.3)
Chloride: 102 mmol/L (ref 98–111)
Creatinine, Ser: 1.15 mg/dL — ABNORMAL HIGH (ref 0.44–1.00)
GFR, Estimated: 43 mL/min — ABNORMAL LOW (ref >60)
Glucose, Bld: 129 mg/dL — ABNORMAL HIGH (ref 70–99)
Potassium: 3.6 mmol/L (ref 3.5–5.1)
Sodium: 137 mmol/L (ref 135–145)

## 2023-03-16 LAB — ECHOCARDIOGRAM COMPLETE
AR max vel: 1.6 cm2
AV Area VTI: 1.53 cm2
AV Area mean vel: 1.44 cm2
AV Mean grad: 7 mmHg
AV Peak grad: 11.6 mmHg
Ao pk vel: 1.71 m/s
Area-P 1/2: 3.6 cm2
Height: 62 in
MV M vel: 4.08 m/s
MV Peak grad: 66.6 mmHg
P 1/2 time: 421 msec
S' Lateral: 2.6 cm
Weight: 2176.38 oz

## 2023-03-16 LAB — TYPE AND SCREEN
ABO/RH(D): O POS
Antibody Screen: NEGATIVE

## 2023-03-16 LAB — HEMOGLOBIN A1C
Hgb A1c MFr Bld: 5.4 % (ref 4.8–5.6)
Mean Plasma Glucose: 108.28 mg/dL

## 2023-03-16 LAB — HEPARIN LEVEL (UNFRACTIONATED): Heparin Unfractionated: 0.73 [IU]/mL — ABNORMAL HIGH (ref 0.30–0.70)

## 2023-03-16 LAB — APTT: aPTT: 32 s (ref 24–36)

## 2023-03-16 MED ORDER — TRANEXAMIC ACID-NACL 1000-0.7 MG/100ML-% IV SOLN
1000.0000 mg | INTRAVENOUS | Status: AC
  Administered 2023-03-17: 1000 mg via INTRAVENOUS

## 2023-03-16 MED ORDER — LACTATED RINGERS IV SOLN: INTRAVENOUS | Status: DC

## 2023-03-16 MED ORDER — MAGNESIUM CITRATE PO SOLN: 1.0000 | Freq: Once | ORAL | Status: DC | PRN

## 2023-03-16 MED ORDER — LOSARTAN POTASSIUM 50 MG PO TABS
25.0000 mg | ORAL_TABLET | Freq: Every evening | ORAL | Status: DC
  Administered 2023-03-16 – 2023-03-19 (×4): 25 mg via ORAL
  Filled 2023-03-16 (×4): qty 1

## 2023-03-16 MED ORDER — BISACODYL 5 MG PO TBEC
5.0000 mg | DELAYED_RELEASE_TABLET | Freq: Every day | ORAL | Status: DC | PRN
  Administered 2023-03-19 – 2023-03-20 (×2): 5 mg via ORAL
  Filled 2023-03-16 (×2): qty 1

## 2023-03-16 MED ORDER — FERROUS SULFATE 325 (65 FE) MG PO TABS
325.0000 mg | ORAL_TABLET | Freq: Every day | ORAL | Status: DC
  Administered 2023-03-18 – 2023-03-20 (×3): 325 mg via ORAL
  Filled 2023-03-16 (×4): qty 1

## 2023-03-16 MED ORDER — ENSURE ENLIVE PO LIQD
237.0000 mL | Freq: Two times a day (BID) | ORAL | Status: DC
  Administered 2023-03-16 – 2023-03-19 (×5): 237 mL via ORAL

## 2023-03-16 MED ORDER — CEFAZOLIN SODIUM-DEXTROSE 2-4 GM/100ML-% IV SOLN: 2.0000 g | INTRAVENOUS | Status: DC

## 2023-03-16 MED ORDER — PANTOPRAZOLE SODIUM 40 MG PO TBEC
40.0000 mg | DELAYED_RELEASE_TABLET | Freq: Every day | ORAL | Status: DC
  Administered 2023-03-17 – 2023-03-20 (×4): 40 mg via ORAL
  Filled 2023-03-16 (×4): qty 1

## 2023-03-16 MED ORDER — SENNOSIDES-DOCUSATE SODIUM 8.6-50 MG PO TABS: 1.0000 | ORAL_TABLET | Freq: Every evening | ORAL | Status: DC | PRN

## 2023-03-16 MED ORDER — IRON (FERROUS SULFATE) 325 (65 FE) MG PO TABS: 325.0000 mg | ORAL_TABLET | Freq: Every day | ORAL | Status: DC

## 2023-03-16 MED ORDER — ADULT MULTIVITAMIN W/MINERALS CH
1.0000 | ORAL_TABLET | Freq: Every day | ORAL | Status: DC
  Administered 2023-03-16 – 2023-03-18 (×2): 1
  Filled 2023-03-16 (×3): qty 1

## 2023-03-16 MED ORDER — MIRTAZAPINE 15 MG PO TABS
7.5000 mg | ORAL_TABLET | Freq: Every day | ORAL | Status: DC
  Administered 2023-03-16 – 2023-03-19 (×4): 7.5 mg via ORAL
  Filled 2023-03-16 (×4): qty 1

## 2023-03-16 MED ORDER — HEPARIN (PORCINE) 25000 UT/250ML-% IV SOLN
850.0000 [IU]/h | INTRAVENOUS | Status: DC
  Administered 2023-03-16: 850 [IU]/h via INTRAVENOUS
  Filled 2023-03-16: qty 250

## 2023-03-16 MED ORDER — ATENOLOL 50 MG PO TABS
50.0000 mg | ORAL_TABLET | Freq: Every day | ORAL | Status: DC
  Administered 2023-03-16 – 2023-03-20 (×5): 50 mg via ORAL
  Filled 2023-03-16 (×5): qty 1

## 2023-03-16 MED ORDER — HYDROCODONE-ACETAMINOPHEN 5-325 MG PO TABS
1.0000 | ORAL_TABLET | Freq: Four times a day (QID) | ORAL | Status: DC | PRN
  Administered 2023-03-16 (×3): 2 via ORAL
  Filled 2023-03-16 (×3): qty 2

## 2023-03-16 NOTE — Plan of Care (Signed)
Surgery Pended for tomorrow

## 2023-03-16 NOTE — Progress Notes (Signed)
Initial Nutrition Assessment  DOCUMENTATION CODES:   Not applicable  INTERVENTION:   - Continue Regular diet order  - Ensure Enlive po BID, each supplement provides 350 kcal and 20 grams of protein  - MVI with minerals daily  NUTRITION DIAGNOSIS:   Increased nutrient needs related to hip fracture as evidenced by estimated needs.  GOAL:   Patient will meet greater than or equal to 90% of their needs  MONITOR:   PO intake, Supplement acceptance, Weight trends  REASON FOR ASSESSMENT:   Consult Hip fracture protocol  ASSESSMENT:   89 year old female who presented to the ED on 8/15 after a fall. PMH of diverticulitis, GERD, HTN, HLD, CVA, atrial fibrillation, vitamin B12 deficiency. Pt admitted with R intertochanteric hip fracture.  Noted plan for operative stabilization tomorrow. Per discussion with Attending MD and with Orthopedics MD, pt is okay for a diet today. Regular diet has been ordered.  Spoke with pt and son at bedside. Pt receiving breakfast at time of RD visit which included oatmeal and coffee. Pt shares that she does not eat a lot and does not have a big appetite at baseline. Pt typically eats 3 meals daily. Breakfast includes a small bowl of cheerios with milk and half a cup of coffee lunch. Lunch is usually cooked by her son and may include meatloaf, green beans, and turnip greens or stir fry. Supper is something small like another bowl of cereal or leftovers.  Pt reports that her UBW is 136 lbs. She states that at one point, she was hospitalized and not eating and lost weight down to 125 lbs. She reports that she has been able to gain weight back now to 136 lbs. Reviewed weight history in chart. Pt's weight has fluctuated between 59-63.5 kg over the last 13 months with no real trends noted. Current weight is up slightly compared to weight from June 2024.  Pt willing to consume oral nutrition supplements. She states that she drinks a chocolate nutrition drink at  home but can usually only finish half of it at a time. Discussed increased nutrient needs related to hip fracture and post-op healing. Pt and son expressed understanding.  Pt with some mild fat and muscle depletions. Suspect these are related to advanced age rather than true malnutrition. Will continue to monitor weight trends, PO intake, and NFPE.  Medications reviewed and include: ferrous sulfate, remeron 7.5 mg daily, protonix IVF: LR @ 75 ml/hr  Labs reviewed. CBG's: 121-168  NUTRITION - FOCUSED PHYSICAL EXAM:  Flowsheet Row Most Recent Value  Orbital Region Mild depletion  Upper Arm Region No depletion  Thoracic and Lumbar Region No depletion  Buccal Region Mild depletion  Temple Region Mild depletion  Clavicle Bone Region Mild depletion  Clavicle and Acromion Bone Region Mild depletion  Scapular Bone Region No depletion  Dorsal Hand No depletion  Patellar Region Mild depletion  Anterior Thigh Region Mild depletion  Posterior Calf Region Mild depletion  Edema (RD Assessment) None  Hair Reviewed  Eyes Reviewed  Mouth Reviewed  Skin Reviewed  Nails Reviewed    Diet Order:   Diet Order             Diet NPO time specified  Diet effective midnight           Diet regular Room service appropriate? Yes with Assist; Fluid consistency: Thin  Diet effective now                   EDUCATION NEEDS:  Education needs have been addressed  Skin:  Skin Assessment: Reviewed RN Assessment  Last BM:  03/15/23  Height:   Ht Readings from Last 1 Encounters:  03/15/23 5\' 2"  (1.575 m)    Weight:   Wt Readings from Last 1 Encounters:  03/15/23 61.7 kg    Ideal Body Weight:  50 kg  BMI:  Body mass index is 24.88 kg/m.  Estimated Nutritional Needs:   Kcal:  1500-1700  Protein:  75-85 grams  Fluid:  1.5-1.7 L    Mertie Clause, MS, RD, LDN Registered Dietitian II Please see [facility] for contact information.

## 2023-03-16 NOTE — H&P (Signed)
History and Physical   [facility] [place] @ [facility] Admission History and Physical [employer], D.O.    Patient Name: Christine Middleton MR#: [id number] Date of Birth: Dec 24, 1922 Date of Admission: 03/15/2023  Referring MD/NP/PA: Dr. Preston Fleeting Primary Care Physician: Nelwyn Salisbury, MD  Chief Complaint:  Chief Complaint  Patient presents with   Fall    HPI: Christine Middleton is a 89 y.o. female with a known history of diverticulitis, GERD, hypertension, hyperlipidemia, CVA, atrial fibrillation on Eliquis and B12 deficiency presents to the emergency department for evaluation of hip pain status post fall.  Patient was in a usual state of health until this afternoon she was trimming bushes outside in her yard when she tripped and fell.  She does not know how long she was there before her family found her around 9 PM.  She complains of mild right hip pain that is better after medication in the emergency department.  Patient lives at home with her adult son.  She is functionally independent, does not attempt to climb stairs.  Patient denies fevers/chills, weakness, dizziness, chest pain, shortness of breath, N/V/C/D, abdominal pain, dysuria/frequency, changes in mental status.    Otherwise there has been no change in status. Patient has been taking medication as prescribed and there has been no recent change in medication or diet.  No recent antibiotics.  There has been no recent illness, hospitalizations, travel or sick contacts.    EMS/ED Course: Patient received Zofran, fentanyl. Medical admission has been requested for further management of right intertrochanteric hip fracture.  Review of Systems:  CONSTITUTIONAL: No fever/chills, fatigue, weakness, weight gain/loss, headache. EYES: No blurry or double vision. ENT: No tinnitus, postnasal drip, redness or soreness of the oropharynx. RESPIRATORY: No cough, dyspnea, wheeze.  No hemoptysis.  CARDIOVASCULAR: No chest pain, palpitations,  syncope, orthopnea. No lower extremity edema.  GASTROINTESTINAL: No nausea, vomiting, abdominal pain, diarrhea, constipation.  No hematemesis, melena or hematochezia. GENITOURINARY: No dysuria, frequency, hematuria. ENDOCRINE: No polyuria or nocturia. No heat or cold intolerance. HEMATOLOGY: No anemia, bruising, bleeding. INTEGUMENTARY: No rashes, ulcers, lesions. MUSCULOSKELETAL: Right hip pain per HPI white female no arthritis, gout. NEUROLOGIC: No numbness, tingling, ataxia, seizure-type activity, weakness. PSYCHIATRIC: No anxiety, depression, insomnia.   Past Medical History:  Diagnosis Date   Blood transfusion without reported diagnosis    Diverticulitis    Esophageal stricture    GERD (gastroesophageal reflux disease)    Hyperlipidemia    Hypertension    Shingles    Stroke (HCC)    Vitamin B 12 deficiency     Past Surgical History:  Procedure Laterality Date   ABDOMINAL HYSTERECTOMY     APPENDECTOMY     COLONOSCOPY  12-09-08   per Dr. Jarold Motto (incomplete along with barium enema 12-10-08) with hemorrhoids  and diverticulae   COLONOSCOPY WITH PROPOFOL N/A 07/10/2019   Procedure: COLONOSCOPY WITH PROPOFOL;  Surgeon: Lemar Lofty., MD;  Location: [facility];  Service: Gastroenterology;  Laterality: N/A;   ESOPHAGOGASTRODUODENOSCOPY  09-2004   per Dr. Jarold Motto with dilatation    EYE SURGERY     HEMOSTASIS CLIP PLACEMENT  07/10/2019   Procedure: HEMOSTASIS CLIP PLACEMENT;  Surgeon: Lemar Lofty., MD;  Location: [facility];  Service: Gastroenterology;;   POLYPECTOMY  07/10/2019   Procedure: POLYPECTOMY;  Surgeon: Lemar Lofty., MD;  Location: [facility];  Service: Gastroenterology;;   TONSILLECTOMY       reports that she has never smoked. She has never used smokeless tobacco. She reports  that she does not drink alcohol and does not use drugs.  Allergies  Allergen Reactions   Amoxicillin     REACTION: rash   Codeine    Lipitor  [Atorvastatin]     Muscle pain, stiff joints   Lisinopril    Nitrofurantoin    Tramadol Anxiety    Sedation, worsening depression.     Family History  Problem Relation Age of Onset   Heart attack Mother    Heart disease Father    Breast cancer Other    Diabetes Other    Hypertension Other    Stroke Other    Heart disease Other    Stroke Sister    Cancer Brother    Cancer Brother    Diabetes Brother     Prior to Admission medications   Medication Sig Start Date End Date Taking? Authorizing Provider  Iron, Ferrous Sulfate, 325 (65 Fe) MG TABS Take 325 mg by mouth daily. 01/10/23   Nelwyn Salisbury, MD  acetaminophen (TYLENOL) 650 MG CR tablet Take 650 mg by mouth every 8 (eight) hours as needed for pain.    [provider]  apixaban (ELIQUIS) 2.5 MG TABS tablet Take 1 tablet (2.5 mg total) by mouth 2 (two) times daily. 01/02/23   Leroy Sea, MD  atenolol (TENORMIN) 50 MG tablet TAKE 1 TABLET BY MOUTH EVERY DAY 02/06/23   Nelwyn Salisbury, MD  docusate sodium (COLACE) 100 MG capsule Take 2 capsules (200 mg total) by mouth 2 (two) times daily. 12/23/22   Leroy Sea, MD  hydrocortisone (ANUSOL-HC) 25 MG suppository Place 1 suppository (25 mg total) rectally 2 (two) times daily. 12/23/22   Leroy Sea, MD  losartan (COZAAR) 25 MG tablet Take 1 tablet (25 mg total) by mouth every evening. 11/14/22   Nelwyn Salisbury, MD  mirtazapine (REMERON) 15 MG tablet Take 1/2 (7.5 mg) tablet by mouth at bedtime 11/14/22   Nelwyn Salisbury, MD  pantoprazole (PROTONIX) 40 MG tablet TAKE 1 TABLET BY MOUTH EVERY DAY 02/06/23   Nelwyn Salisbury, MD  polyethylene glycol (MIRALAX / GLYCOLAX) 17 g packet Take 17 g by mouth daily. Patient taking differently: Take 17 g by mouth as needed. 07/12/19   Jae Dire, MD  VITAMIN D PO Take 100 Units by mouth.    [provider]    Physical Exam: Vitals:   03/15/23 2240 03/15/23 2241 03/15/23 2300 03/15/23 2310  BP:   (!) 146/81 (!) 146/81   Pulse:   90 89  Resp:   20 14  Temp:      TempSrc:      SpO2:  97% 98% 97%  Weight: 61.7 kg     Height: 5\' 2"  (1.575 m)       GENERAL: 89 y.o.-year-old white female patient, well-developed, well-nourished lying in the bed in no acute distress.  Pleasant and cooperative.   HEENT: Cervical collar in place head atraumatic, normocephalic. Pupils equal. Mucus membranes dry NECK: Supple. No JVD. CHEST: Normal breath sounds bilaterally. No wheezing, rales, rhonchi or crackles. No use of accessory muscles of respiration.  No reproducible chest wall tenderness.  CARDIOVASCULAR: S1, S2 normal. No murmurs, rubs, or gallops. Cap refill <2 seconds. Pulses intact distally.  ABDOMEN: Soft, nondistended, nontender. No rebound, guarding, rigidity. Normoactive bowel sounds present in all four quadrants.  EXTREMITIES: Right lower extremity is shortened and externally rotated leg is splinted no pedal edema, cyanosis, or clubbing. No calf tenderness or Homan's  sign.  NEUROLOGIC: The patient is alert and oriented x 3. Cranial nerves II through XII are grossly intact with no focal sensorimotor deficit. PSYCHIATRIC:  Normal affect, mood, thought content. SKIN: Warm, dry, and intact without obvious rash, lesion, or ulcer.    Labs on Admission:  CBC: Recent Labs  Lab 03/15/23 2248  WBC 15.4*  NEUTROABS 13.8*  HGB 10.9*  HCT 34.2*  MCV 89.1  PLT 201   Basic Metabolic Panel: Recent Labs  Lab 03/15/23 2248  NA 137  K 4.4  CL 100  CO2 21*  GLUCOSE 207*  BUN 22  CREATININE 1.22*  CALCIUM 9.2   GFR: Estimated Creatinine Clearance: 22.2 mL/min (A) (by C-G formula based on SCr of 1.22 mg/dL (H)). Liver Function Tests: No results for input(s): "AST", "ALT", "ALKPHOS", "BILITOT", "PROT", "ALBUMIN" in the last 168 hours. No results for input(s): "LIPASE", "AMYLASE" in the last 168 hours. No results for input(s): "AMMONIA" in the last 168 hours. Coagulation Profile: Recent Labs  Lab 03/15/23 2248   INR 1.3*   Cardiac Enzymes: No results for input(s): "CKTOTAL", "CKMB", "CKMBINDEX", "TROPONINI" in the last 168 hours. BNP (last 3 results) No results for input(s): "PROBNP" in the last 8760 hours. HbA1C: No results for input(s): "HGBA1C" in the last 72 hours. CBG: No results for input(s): "GLUCAP" in the last 168 hours. Lipid Profile: No results for input(s): "CHOL", "HDL", "LDLCALC", "TRIG", "CHOLHDL", "LDLDIRECT" in the last 72 hours. Thyroid Function Tests: No results for input(s): "TSH", "T4TOTAL", "FREET4", "T3FREE", "THYROIDAB" in the last 72 hours. Anemia Panel: No results for input(s): "VITAMINB12", "FOLATE", "FERRITIN", "TIBC", "IRON", "RETICCTPCT" in the last 72 hours. Urine analysis:    Component Value Date/Time   COLORURINE YELLOW 01/17/2021 1937   APPEARANCEUR CLEAR 01/17/2021 1937   LABSPEC 1.006 01/17/2021 1937   PHURINE 7.0 01/17/2021 1937   GLUCOSEU NEGATIVE 01/17/2021 1937   HGBUR NEGATIVE 01/17/2021 1937   HGBUR negative 08/31/2009 0927   BILIRUBINUR NEGATIVE 01/17/2021 1937   BILIRUBINUR neg 10/02/2018 1058   KETONESUR NEGATIVE 01/17/2021 1937   PROTEINUR NEGATIVE 01/17/2021 1937   UROBILINOGEN 0.2 10/02/2018 1058   UROBILINOGEN 0.2 10/04/2013 2030   NITRITE NEGATIVE 01/17/2021 1937   LEUKOCYTESUR TRACE (A) 01/17/2021 1937   Sepsis Labs: @LABRCNTIP (procalcitonin:4,lacticidven:4) )No results found for this or any previous visit (from the past 240 hour(s)).   Radiological Exams on Admission: CT HEAD WO CONTRAST  Result Date: 03/15/2023 CLINICAL DATA:  Head trauma fall, hip fracture EXAM: CT HEAD WITHOUT CONTRAST CT CERVICAL SPINE WITHOUT CONTRAST TECHNIQUE: Multidetector CT imaging of the head and cervical spine was performed following the standard protocol without intravenous contrast. Multiplanar CT image reconstructions of the cervical spine were also generated. RADIATION DOSE REDUCTION: This exam was performed according to the departmental  dose-optimization program which includes automated exposure control, adjustment of the mA and/or kV according to patient size and/or use of iterative reconstruction technique. COMPARISON:  CT brain 06/03/2022 FINDINGS: CT HEAD FINDINGS Brain: No acute territorial infarction or hemorrhage is visualized. Stable partially calcified 1.9 cm left convexity extra-axial mass most likely representing calcified hemangioma. Atrophy and chronic small vessel ischemic changes of the white matter. Small chronic infarct high left frontal vertex. 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 Stable ventricle size Vascular: No hyperdense vessels.  Carotid vascular calcification Skull: Normal. Negative for fracture or focal lesion. Sinuses/Orbits: Mucosal thickening in the sinuses Other: None CT CERVICAL SPINE FINDINGS Alignment: No subluxation.  Facet alignment within normal limits. Skull base and vertebrae: No  acute fracture. No primary bone lesion or focal pathologic process. Soft tissues and spinal canal: No prevertebral fluid or swelling. No visible canal hematoma. Disc levels: Moderate disc space narrowing and degenerative change C4 through C7. Facet degenerative changes at multiple levels with foraminal narrowing Upper chest: Negative. Other: None IMPRESSION: 1. No CT evidence for acute intracranial abnormality. Atrophy and chronic small vessel ischemic changes of the white matter. Chronic infarcts as above. 2. Stable 1.9 cm calcified left convexity meningioma. 3. Degenerative changes of the cervical spine. No acute osseous abnormality. Electronically Signed   By: Jasmine Pang M.D.   On: 03/15/2023 23:14   CT CERVICAL SPINE WO CONTRAST  Result Date: 03/15/2023 CLINICAL DATA:  Head trauma fall, hip fracture EXAM: CT HEAD WITHOUT CONTRAST CT CERVICAL SPINE WITHOUT CONTRAST TECHNIQUE: Multidetector CT imaging of the head and cervical spine was performed following the standard protocol without intravenous contrast.  Multiplanar CT image reconstructions of the cervical spine were also generated. RADIATION DOSE REDUCTION: This exam was performed according to the departmental dose-optimization program which includes automated exposure control, adjustment of the mA and/or kV according to patient size and/or use of iterative reconstruction technique. COMPARISON:  CT brain 06/03/2022 FINDINGS: CT HEAD FINDINGS Brain: No acute territorial infarction or hemorrhage is visualized. Stable partially calcified 1.9 cm left convexity extra-axial mass most likely representing calcified hemangioma. Atrophy and chronic small vessel ischemic changes of the white matter. Small chronic infarct high left frontal vertex. 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 Stable ventricle size Vascular: No hyperdense vessels.  Carotid vascular calcification Skull: Normal. Negative for fracture or focal lesion. Sinuses/Orbits: Mucosal thickening in the sinuses Other: None CT CERVICAL SPINE FINDINGS Alignment: No subluxation.  Facet alignment within normal limits. Skull base and vertebrae: No acute fracture. No primary bone lesion or focal pathologic process. Soft tissues and spinal canal: No prevertebral fluid or swelling. No visible canal hematoma. Disc levels: Moderate disc space narrowing and degenerative change C4 through C7. Facet degenerative changes at multiple levels with foraminal narrowing Upper chest: Negative. Other: None IMPRESSION: 1. No CT evidence for acute intracranial abnormality. Atrophy and chronic small vessel ischemic changes of the white matter. Chronic infarcts as above. 2. Stable 1.9 cm calcified left convexity meningioma. 3. Degenerative changes of the cervical spine. No acute osseous abnormality. Electronically Signed   By: Jasmine Pang M.D.   On: 03/15/2023 23:14   DG Hip Unilat With Pelvis 2-3 Views Right  Result Date: 03/15/2023 CLINICAL DATA:  Fall with hip pain EXAM: DG HIP (WITH OR WITHOUT PELVIS) 2-3V RIGHT  COMPARISON:  05/03/2022 FINDINGS: SI joints show mild degenerative change. Pubic symphysis and rami are intact. Acute comminuted and displaced right intertrochanteric fracture. No femoral head dislocation IMPRESSION: Acute comminuted and displaced right intertrochanteric fracture. Electronically Signed   By: Jasmine Pang M.D.   On: 03/15/2023 23:07   DG Chest Port 1 View  Result Date: 03/15/2023 CLINICAL DATA:  Fall with hip pain EXAM: PORTABLE CHEST 1 VIEW COMPARISON:  05/03/2022 FINDINGS: Rounded metallic density over the left neck. No acute airspace disease or pleural effusion. Mild diffuse coarse chronic appearing interstitial opacity. Borderline cardiomegaly with aortic atherosclerosis. Apical pleural thickening. No pneumothorax IMPRESSION: No active disease. Chronic appearing interstitial changes. Round metallic density over left neck, correlate with direct inspection Electronically Signed   By: Jasmine Pang M.D.   On: 03/15/2023 23:06    EKG: Normal sinus rhythm at 90 bpm with normal axis and left anterior hemiblock nonspecific ST-T wave  changes.   Assessment/Plan  This is a 89 y.o. female with a history of iron deficiency anemia diverticulitis, GERD, hypertension, hyperlipidemia, CVA, atrial fibrillation on Eliquis and B12 deficiency  now being admitted with:  #.  Right intertrochanteric hip fracture - Admit inpatient - Surgical management per orthopedics - N.p.o. - Hold anticoagulants - Check echo - PT and social work for assistance with discharge planning  #. Acute kidney injury  - IV fluids and repeat BMP in AM.  - Avoid nephrotoxic medications - Bladder scan and place foley catheter if evidence of urinary retention  #.  Hyperglycemia without history of diabetes - Check A1c - Accu-Cheks every 4 hours   #. History of atrial fibrillation - Continue atenolol -Hold Eliquis  #. History of GERD - Continue Protonix  #. History of iron deficiency anemia - Continue ferrous  sulfate  Admission status: Inpatient IV Fluids: LR Diet/Nutrition: N.p.o. after 2 AM Consults called: Orthopedics Dr. Christell Constant consulted DVT Px: SCDs and early ambulation. Code Status: Full Code  Disposition Plan: To be determined  All the records are reviewed and case discussed with ED provider. Management plans discussed with the patient and/or family who express understanding and agree with plan of care.  [employer] D.O. on 03/16/2023 at 12:42 AM CC: Primary care physician; Nelwyn Salisbury, MD   03/16/2023, 12:42 AM

## 2023-03-16 NOTE — ED Notes (Signed)
ED TO INPATIENT HANDOFF REPORT  ED Nurse Name and Phone #: [phone]  S Name/Age/Gender Christine Middleton 89 y.o. female Room/Bed: 026C/026C  Code Status   Code Status: Full Code  Home/SNF/Other Home Patient oriented to: self, place, time, and situation Is this baseline? Yes   Triage Complete: Triage complete  Chief Complaint Closed intertrochanteric fracture of hip, right, initial encounter [facility]) [S72.141A]  Triage Note Patient brought in via EMS, fell while trimming bushes at 3pm son found her at 9pm, Negative LOC, possible poison Ivy, HX Afib, denies neck back pain, patient on blood thinners  BP 178/80    Allergies Allergies  Allergen Reactions   Amoxicillin     REACTION: rash   Codeine    Lipitor [Atorvastatin]     Muscle pain, stiff joints   Lisinopril    Nitrofurantoin    Tramadol Anxiety    Sedation, worsening depression.     Level of Care/Admitting Diagnosis ED Disposition     ED Disposition  Admit   Condition  --   Comment  Hospital Area: [facility] [[id number]]  Level of Care: Med-Surg [16]  May admit patient to [facility] or [facility] if equivalent level of care is available:: No  Covid Evaluation: Asymptomatic - no recent exposure (last 10 days) testing not required  Diagnosis: Closed intertrochanteric fracture of hip, right, initial encounter [facility]) [[id number]]  Admitting Physician: Tonye Royalty [[id number]]  Attending Physician: Janann Colonel  Certification:: I certify this patient will need inpatient services for at least 2 midnights  Expected Medical Readiness: 03/18/2023          B Medical/Surgery History Past Medical History:  Diagnosis Date   Blood transfusion without reported diagnosis    Diverticulitis    Esophageal stricture    GERD (gastroesophageal reflux disease)    Hyperlipidemia    Hypertension    Shingles    Stroke (HCC)    Vitamin B 12 deficiency    Past Surgical History:   Procedure Laterality Date   ABDOMINAL HYSTERECTOMY     APPENDECTOMY     COLONOSCOPY  12-09-08   per Dr. Jarold Motto (incomplete along with barium enema 12-10-08) with hemorrhoids  and diverticulae   COLONOSCOPY WITH PROPOFOL N/A 07/10/2019   Procedure: COLONOSCOPY WITH PROPOFOL;  Surgeon: Lemar Lofty., MD;  Location: [facility];  Service: Gastroenterology;  Laterality: N/A;   ESOPHAGOGASTRODUODENOSCOPY  09-2004   per Dr. Jarold Motto with dilatation    EYE SURGERY     HEMOSTASIS CLIP PLACEMENT  07/10/2019   Procedure: HEMOSTASIS CLIP PLACEMENT;  Surgeon: Lemar Lofty., MD;  Location: [facility];  Service: Gastroenterology;;   POLYPECTOMY  07/10/2019   Procedure: POLYPECTOMY;  Surgeon: Lemar Lofty., MD;  Location: [facility];  Service: Gastroenterology;;   TONSILLECTOMY       A IV Location/Drains/Wounds Patient Lines/Drains/Airways Status     Active Line/Drains/Airways     Name Placement date Placement time Site Days   Peripheral IV 03/15/23 18 G Anterior;Left;Proximal Forearm 03/15/23  2211  Forearm  1   Peripheral IV 03/16/23 20 G Right Antecubital 03/16/23  0152  Antecubital  less than 1            Intake/Output Last 24 hours No intake or output data in the 24 hours ending 03/16/23 0247  Labs/Imaging Results for orders placed or performed during the hospital encounter of 03/15/23 (from the past 48 hour(s))  Basic metabolic panel     Status: Abnormal   Collection  Time: 03/15/23 10:48 PM  Result Value Ref Range   Sodium 137 135 - 145 mmol/L   Potassium 4.4 3.5 - 5.1 mmol/L   Chloride 100 98 - 111 mmol/L   CO2 21 (L) 22 - 32 mmol/L   Glucose, Bld 207 (H) 70 - 99 mg/dL    Comment: Glucose reference range applies only to samples taken after fasting for at least 8 hours.   BUN 22 8 - 23 mg/dL   Creatinine, Ser 7.82 (H) 0.44 - 1.00 mg/dL   Calcium 9.2 8.9 - 95.6 mg/dL   GFR, Estimated 40 (L) >60 mL/min    Comment: (NOTE) Calculated using  the CKD-EPI Creatinine Equation (2021)    Anion gap 16 (H) 5 - 15    Comment: Performed at [facility], [address] [place], [place] [address]  CBC with Differential     Status: Abnormal   Collection Time: 03/15/23 10:48 PM  Result Value Ref Range   WBC 15.4 (H) 4.0 - 10.5 K/uL   RBC 3.84 (L) 3.87 - 5.11 MIL/uL   Hemoglobin 10.9 (L) 12.0 - 15.0 g/dL   HCT 65.7 (L) 84.6 - 96.2 %   MCV 89.1 80.0 - 100.0 fL   MCH 28.4 26.0 - 34.0 pg   MCHC 31.9 30.0 - 36.0 g/dL   RDW 95.2 (H) 84.1 - 32.4 %   Platelets 201 150 - 400 K/uL   nRBC 0.0 0.0 - 0.2 %   Neutrophils Relative % 89 %   Neutro Abs 13.8 (H) 1.7 - 7.7 K/uL   Lymphocytes Relative 3 %   Lymphs Abs 0.5 (L) 0.7 - 4.0 K/uL   Monocytes Relative 7 %   Monocytes Absolute 1.0 0.1 - 1.0 K/uL   Eosinophils Relative 0 %   Eosinophils Absolute 0.0 0.0 - 0.5 K/uL   Basophils Relative 0 %   Basophils Absolute 0.0 0.0 - 0.1 K/uL   Immature Granulocytes 1 %   Abs Immature Granulocytes 0.08 (H) 0.00 - 0.07 K/uL    Comment: Performed at [facility], [address], [place], [place] [address]  Protime-INR     Status: Abnormal   Collection Time: 03/15/23 10:48 PM  Result Value Ref Range   Prothrombin Time 16.4 (H) 11.4 - 15.2 seconds   INR 1.3 (H) 0.8 - 1.2    Comment: (NOTE) INR goal varies based on device and disease states. Performed at [facility], [address], [place], [place] [address]   Type and screen     Status: None   Collection Time: 03/15/23 11:24 PM  Result Value Ref Range   ABO/RH(D) O POS    Antibody Screen NEG    Sample Expiration      03/18/2023,2359 Performed at [facility], [address], [place], [place] [address]    DG Knee 1-2 Views Right  Result Date: 03/16/2023 CLINICAL DATA:  Level 2 trauma, hip fracture. EXAM: RIGHT KNEE - 1-2 VIEW COMPARISON:  None. FINDINGS: No acute fracture or dislocation. Mild degenerative changes are present in the medial and patellofemoral  compartments. No joint effusion. Vascular calcifications are noted in the soft tissues. IMPRESSION: No acute fracture or dislocation. Electronically Signed   By: Thornell Sartorius M.D.   On: 03/16/2023 00:47   CT HEAD WO CONTRAST  Result Date: 03/15/2023 CLINICAL DATA:  Head trauma fall, hip fracture EXAM: CT HEAD WITHOUT CONTRAST CT CERVICAL SPINE WITHOUT CONTRAST TECHNIQUE: Multidetector CT imaging of the head  and cervical spine was performed following the standard protocol without intravenous contrast. Multiplanar CT image reconstructions of the cervical spine were also generated. RADIATION DOSE REDUCTION: This exam was performed according to the departmental dose-optimization program which includes automated exposure control, adjustment of the mA and/or kV according to patient size and/or use of iterative reconstruction technique. COMPARISON:  CT brain 06/03/2022 FINDINGS: CT HEAD FINDINGS Brain: No acute territorial infarction or hemorrhage is visualized. Stable partially calcified 1.9 cm left convexity extra-axial mass most likely representing calcified hemangioma. Atrophy and chronic small vessel ischemic changes of the white matter. Small chronic infarct high left frontal vertex. 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 Stable ventricle size Vascular: No hyperdense vessels.  Carotid vascular calcification Skull: Normal. Negative for fracture or focal lesion. Sinuses/Orbits: Mucosal thickening in the sinuses Other: None CT CERVICAL SPINE FINDINGS Alignment: No subluxation.  Facet alignment within normal limits. Skull base and vertebrae: No acute fracture. No primary bone lesion or focal pathologic process. Soft tissues and spinal canal: No prevertebral fluid or swelling. No visible canal hematoma. Disc levels: Moderate disc space narrowing and degenerative change C4 through C7. Facet degenerative changes at multiple levels with foraminal narrowing Upper chest: Negative. Other: None IMPRESSION: 1.  No CT evidence for acute intracranial abnormality. Atrophy and chronic small vessel ischemic changes of the white matter. Chronic infarcts as above. 2. Stable 1.9 cm calcified left convexity meningioma. 3. Degenerative changes of the cervical spine. No acute osseous abnormality. Electronically Signed   By: Jasmine Pang M.D.   On: 03/15/2023 23:14   CT CERVICAL SPINE WO CONTRAST  Result Date: 03/15/2023 CLINICAL DATA:  Head trauma fall, hip fracture EXAM: CT HEAD WITHOUT CONTRAST CT CERVICAL SPINE WITHOUT CONTRAST TECHNIQUE: Multidetector CT imaging of the head and cervical spine was performed following the standard protocol without intravenous contrast. Multiplanar CT image reconstructions of the cervical spine were also generated. RADIATION DOSE REDUCTION: This exam was performed according to the departmental dose-optimization program which includes automated exposure control, adjustment of the mA and/or kV according to patient size and/or use of iterative reconstruction technique. COMPARISON:  CT brain 06/03/2022 FINDINGS: CT HEAD FINDINGS Brain: No acute territorial infarction or hemorrhage is visualized. Stable partially calcified 1.9 cm left convexity extra-axial mass most likely representing calcified hemangioma. Atrophy and chronic small vessel ischemic changes of the white matter. Small chronic infarct high left frontal vertex. 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 Stable ventricle size Vascular: No hyperdense vessels.  Carotid vascular calcification Skull: Normal. Negative for fracture or focal lesion. Sinuses/Orbits: Mucosal thickening in the sinuses Other: None CT CERVICAL SPINE FINDINGS Alignment: No subluxation.  Facet alignment within normal limits. Skull base and vertebrae: No acute fracture. No primary bone lesion or focal pathologic process. Soft tissues and spinal canal: No prevertebral fluid or swelling. No visible canal hematoma. Disc levels: Moderate disc space narrowing and  degenerative change C4 through C7. Facet degenerative changes at multiple levels with foraminal narrowing Upper chest: Negative. Other: None IMPRESSION: 1. No CT evidence for acute intracranial abnormality. Atrophy and chronic small vessel ischemic changes of the white matter. Chronic infarcts as above. 2. Stable 1.9 cm calcified left convexity meningioma. 3. Degenerative changes of the cervical spine. No acute osseous abnormality. Electronically Signed   By: Jasmine Pang M.D.   On: 03/15/2023 23:14   DG Hip Unilat With Pelvis 2-3 Views Right  Result Date: 03/15/2023 CLINICAL DATA:  Fall with hip pain EXAM: DG HIP (WITH OR WITHOUT PELVIS) 2-3V RIGHT  COMPARISON:  05/03/2022 FINDINGS: SI joints show mild degenerative change. Pubic symphysis and rami are intact. Acute comminuted and displaced right intertrochanteric fracture. No femoral head dislocation IMPRESSION: Acute comminuted and displaced right intertrochanteric fracture. Electronically Signed   By: Jasmine Pang M.D.   On: 03/15/2023 23:07   DG Chest Port 1 View  Result Date: 03/15/2023 CLINICAL DATA:  Fall with hip pain EXAM: PORTABLE CHEST 1 VIEW COMPARISON:  05/03/2022 FINDINGS: Rounded metallic density over the left neck. No acute airspace disease or pleural effusion. Mild diffuse coarse chronic appearing interstitial opacity. Borderline cardiomegaly with aortic atherosclerosis. Apical pleural thickening. No pneumothorax IMPRESSION: No active disease. Chronic appearing interstitial changes. Round metallic density over left neck, correlate with direct inspection Electronically Signed   By: Jasmine Pang M.D.   On: 03/15/2023 23:06    Pending Labs Unresulted Labs (From admission, onward)     Start     Ordered   03/16/23 1000  Heparin level (unfractionated)  Once-Timed,   TIMED        03/16/23 0109   03/16/23 1000  APTT  Once-Timed,   TIMED        03/16/23 0109   03/16/23 0500  Heparin level (unfractionated)  Daily,   R      03/16/23 0109    03/16/23 0500  APTT  Daily,   R      03/16/23 0109   Signed and Held  Hemoglobin A1c  Once,   R        Signed and Held            Vitals/Pain Today's Vitals   03/15/23 2241 03/15/23 2300 03/15/23 2310 03/15/23 2315  BP:  (!) 146/81 (!) 146/81 (!) 151/78  Pulse:  90 89 89  Resp:  20 14 18   Temp:      TempSrc:      SpO2: 97% 98% 97% 98%  Weight:      Height:      PainSc:        Isolation Precautions No active isolations  Medications Medications  0.9 %  sodium chloride infusion (0 mLs Intravenous Hold 03/16/23 0137)  lactated ringers infusion ( Intravenous New Bag/Given 03/16/23 0111)  heparin ADULT infusion 100 units/mL (25000 units/224mL) (850 Units/hr Intravenous New Bag/Given 03/16/23 0157)  fentaNYL (SUBLIMAZE) injection 50 mcg (50 mcg Intravenous Given 03/16/23 0151)  ondansetron (ZOFRAN) injection 4 mg (4 mg Intravenous Given 03/15/23 2251)    Mobility walks   Normally walks, patient has right side hip fracture   Focused Assessments   Closed intertrochanteric fracture of right hip   R Recommendations: See Admitting Provider Note  Report given to:   Additional Notes:

## 2023-03-16 NOTE — Progress Notes (Signed)
ANTICOAGULATION CONSULT NOTE - Initial Consult  Pharmacy Consult for heparin Indication: atrial fibrillation - bridging while awaiting surgery  Allergies  Allergen Reactions   Amoxicillin     REACTION: rash   Codeine    Lipitor [Atorvastatin]     Muscle pain, stiff joints   Lisinopril    Nitrofurantoin    Tramadol Anxiety    Sedation, worsening depression.     Patient Measurements: Height: 5\' 2"  (157.5 cm) Weight: 61.7 kg (136 lb 0.4 oz) IBW/kg (Calculated) : 50.1 Heparin Dosing Weight: 61.7 kg   Vital Signs: Temp: 98.3 F (36.8 C) (08/16 1218) Temp Source: Oral (08/16 1218) BP: 121/48 (08/16 1218) Pulse Rate: 71 (08/16 1218)  Labs: Recent Labs    03/15/23 2248 03/16/23 1424  HGB 10.9* 9.7*  HCT 34.2* 29.9*  PLT 201 177  APTT  --  32  LABPROT 16.4*  --   INR 1.3*  --   HEPARINUNFRC  --  0.73*  CREATININE 1.22* 1.15*    Estimated Creatinine Clearance: 23.6 mL/min (A) (by C-G formula based on SCr of 1.15 mg/dL (H)).   Medical History: Past Medical History:  Diagnosis Date   Blood transfusion without reported diagnosis    Diverticulitis    Esophageal stricture    GERD (gastroesophageal reflux disease)    Hyperlipidemia    Hypertension    Shingles    Stroke (HCC)    Vitamin B 12 deficiency     Medications:  Scheduled:   atenolol  50 mg Oral Daily   feeding supplement  237 mL Oral BID BM   ferrous sulfate  325 mg Oral Q breakfast   losartan  25 mg Oral QPM   mirtazapine  7.5 mg Oral QHS   multivitamin with minerals  1 tablet Per Tube Daily   pantoprazole  40 mg Oral Daily    Assessment: 89 yof presenting with hip pain s/p fall. On apixaban PTA for hx Afib (LD 8/15 ~1300). Head CT neg. Imaging is showing R intertrochanteric fx.   Hgb 10.9, plt 201. INR 1.3 - qualitatively increased by apixaban. Given recent DOAC, will monitor both aPTT and heparin level until correlate.   8/16 PM update:  Heparin paused at 0500 in anticipation of surgery today  but was rescheduled for 8/17.    Goal of Therapy:  Heparin level 0.3-0.7 units/ml aPTT 66-102 seconds Monitor platelets by anticoagulation protocol: Yes   Plan:  Restart heparin infusion at 850 units/hr on 8/16 No level ordered (will hold @ 0100 prior to surgery, ~6h pre-op) F/u level after surgery 8/17 Monitor daily aPTT/HL until correlate, CBC, and for s/sx of bleeding   Trixie Rude, PharmD Clinical Pharmacist 03/16/2023  3:27 PM  Please check AMION for all [facility] Pharmacy phone numbers After 10:00 PM, call Main Pharmacy [phone]

## 2023-03-16 NOTE — Progress Notes (Signed)
Progress Note   Patient: Christine Middleton ZOX:[id number] DOB: 1922-08-05 DOA: 03/15/2023     0 DOS: the patient was seen and examined on 03/16/2023    Subjective:  Patient seen and examined at bedside this morning With a right hip fracture currently pending surgery Tells me pain is improving with pain medication Denies nausea vomiting chest pain or abdominal pain   Brief hospital course: From HPI "Christine Middleton is a 89 y.o. female with a known history of diverticulitis, GERD, hypertension, hyperlipidemia, CVA, atrial fibrillation on Eliquis and B12 deficiency presents to the emergency department for evaluation of hip pain status post fall.  Patient was in a usual state of health until this afternoon she was trimming bushes outside in her yard when she tripped and fell.  She does not know how long she was there before her family found her around 9 PM.  She complains of mild right hip pain that is better after medication in the emergency department.   Patient lives at home with her adult son.  She is functionally independent, does not attempt to climb stairs.   Patient denies fevers/chills, weakness, dizziness, chest pain, shortness of breath, N/V/C/D, abdominal pain, dysuria/frequency, changes in mental status.    Otherwise there has been no change in status. Patient has been taking medication as prescribed and there has been no recent change in medication or diet.  No recent antibiotics.  There has been no recent illness, hospitalizations, travel or sick contacts.  "  Assessment and Plan: Right intertrochanteric hip fracture Plan of care discussed with orthopedic surgeon Surgery is being planned tentatively for tomorrow Continue current pain management Follow-up on echocardiogram - PT and social work for assistance with discharge planning   #. Acute kidney injury  Avoid nephrotoxic agents Continue current IV fluid We will continue to monitor renal function Bladder scan and place foley  catheter if evidence of urinary retention   #.  Hyperglycemia without history of diabetes - Follow-up on hemoglobin A1c Monitor glucose level closely   #. History of atrial fibrillation - Continue atenolol - Currently holding Eliquis as surgery is being planned We will continue heparin drip   #. History of GERD -Continue Protonix   #. History of iron deficiency anemia -Continue ferrous sulfate   Admission status: Inpatient IV Fluids: LR Diet/Nutrition: N.p.o. after 2 AM Consults called: Orthopedics Dr. Christell Constant consulted DVT Px: SCDs and early ambulation. Code Status: Full Code  Disposition Plan: To be determined           Physical Exam: GENERAL: Elderly female laying in bed in no acute distress CHEST: Normal breath sounds bilaterally. No wheezing, rales, rhonchi or crackles. No use of accessory muscles of respiration.  No reproducible chest wall tenderness.  CARDIOVASCULAR: S1-S2 present ABDOMEN: Soft nondistended nontender.  EXTREMITIES: Right lower extremity is shortened and externally rotated leg is splinted no pedal edema, cyanosis, or clubbing. No calf tenderness or Homan's sign.  NEUROLOGIC: The patient is alert and oriented x 3. Cranial nerves II through XII are grossly intact with no focal sensorimotor deficit. PSYCHIATRIC:  Normal affect, mood, thought content. SKIN: Warm, dry, and intact without obvious rash, lesion, or ulcer.  Vitals:   03/15/23 2315 03/16/23 0323 03/16/23 0840 03/16/23 1218  BP: (!) 151/78 (!) 173/86 (!) 141/59 (!) 121/48  Pulse: 89 89 78 71  Resp: 18 18 18 18   Temp:  97.7 F (36.5 C) 97.7 F (36.5 C) 98.3 F (36.8 C)  TempSrc:  Oral Oral Oral  SpO2: 98% 97% 99% 94%  Weight:      Height:        Data Reviewed: Have personally reviewed patient's x-ray of the right hip that showed acute comminuted intertrochanteric fracture I have also reviewed patient's CBC as well as CMP and results are shown below    Latest Ref Rng & Units  03/16/2023    2:24 PM 03/15/2023   10:48 PM 02/06/2023   10:27 AM  CBC  WBC 4.0 - 10.5 K/uL 10.4  15.4  6.1   Hemoglobin 12.0 - 15.0 g/dL 9.7  16.1  09.6   Hematocrit 36.0 - 46.0 % 29.9  34.2  34.9   Platelets 150 - 400 K/uL 177  201  225.0        Latest Ref Rng & Units 03/16/2023    2:24 PM 03/15/2023   10:48 PM 01/05/2023   11:58 AM  CMP  Glucose 70 - 99 mg/dL 045  409  811   BUN 8 - 23 mg/dL 19  22  21    Creatinine 0.44 - 1.00 mg/dL 9.14  7.82  9.56   Sodium 135 - 145 mmol/L 137  137  138   Potassium 3.5 - 5.1 mmol/L 3.6  4.4  4.1   Chloride 98 - 111 mmol/L 102  100  102   CO2 22 - 32 mmol/L 25  21  24    Calcium 8.9 - 10.3 mg/dL 8.5  9.2  8.9     Family Communication: None present at bedside at this time    Author: Loyce Dys, MD 03/16/2023 5:40 PM  For on call review [email or web address].

## 2023-03-16 NOTE — Progress Notes (Addendum)
Hold heparin 6 hours prior to surgery per pharmacy. No time scheduled for surgery yet. Pharmacy informed.

## 2023-03-16 NOTE — Consult Note (Signed)
Orthopedic Surgery Consult Note  Assessment: Patient is a 89 y.o. female with right intertrochanteric femur fracture   Plan: -Planning for operative stabilization tomorrow (8/16) -Okay for diet today, NPO at midnight -DVT ppx: will use aspirin 81mg  BID -Antibiotics: ancef on call to OR -TXA on call to OR -Weight bearing status: NWB RLE -PT evaluate and treat post-operatively -Pain control -Dispo: pending completion of operative plans   Discussed recommendation for operative intervention in the form of cephalomedullary rodding. Explained the risks of this procedure included, but were not limited to: nonunion, malunion, hardware failure, infection, bleeding, stiffness, fracture below the rod, need for additional procedures, deep vein thrombosis, pulmonary embolism, and death. The benefits of this procedure would be to promote fracture healing by providing stability and to allow for early mobilization. The alternatives of this surgery would be to treat the fracture with immobilization with or without traction or to do no intervention. The patient's questions were answered to her satisfaction. After this discussion, patient elected to proceed with surgery. Informed consent was obtained.   ___________________________________________________________________________   Chief complaint: right hip pain  History:  Patient is a 89 y.o. female who had a fall at home yesterday. Was found down. Unable to weight bear. Was brought to the emergency department last night. Was admitted to a medicine service last night. She has been having right hip and thigh pain since the fall. No pain elsewhere. Denies paresthesias and numbness.    Past Medical History:  Diagnosis Date   Blood transfusion without reported diagnosis    Diverticulitis    Esophageal stricture    GERD (gastroesophageal reflux disease)    Hyperlipidemia    Hypertension    Shingles    Stroke (HCC)    Vitamin B 12 deficiency      Allergies: amoxicillin, codeine, lisinopril, lipitor, nitrofurantoin, tramadol   Past Surgical History:  Procedure Laterality Date   ABDOMINAL HYSTERECTOMY     APPENDECTOMY     COLONOSCOPY  12-09-08   per Dr. Jarold Motto (incomplete along with barium enema 12-10-08) with hemorrhoids  and diverticulae   COLONOSCOPY WITH PROPOFOL N/A 07/10/2019   Procedure: COLONOSCOPY WITH PROPOFOL;  Surgeon: Lemar Lofty., MD;  Location: [facility];  Service: Gastroenterology;  Laterality: N/A;   ESOPHAGOGASTRODUODENOSCOPY  09-2004   per Dr. Jarold Motto with dilatation    EYE SURGERY     HEMOSTASIS CLIP PLACEMENT  07/10/2019   Procedure: HEMOSTASIS CLIP PLACEMENT;  Surgeon: Lemar Lofty., MD;  Location: [facility];  Service: Gastroenterology;;   POLYPECTOMY  07/10/2019   Procedure: POLYPECTOMY;  Surgeon: Lemar Lofty., MD;  Location: [facility];  Service: Gastroenterology;;   TONSILLECTOMY       Social History   Tobacco Use   Smoking status: Never   Smokeless tobacco: Never  Substance Use Topics   Alcohol use: No    Alcohol/week: 0.0 standard drinks of alcohol    Family history: -reviewed and not pertinent to intertrochanteric femur fracture   Physical Exam:  General: no acute distress, appears stated age Neurologic: alert, answering questions appropriately, following commands Cardiovascular: regular rate, no cyanosis Respiratory: unlabored breathing on room air, symmetric chest rise Psychiatric: appropriate affect, normal cadence to speech  MSK:   -Bilateral upper extremities  No tenderness to palpation over extremity, no gross deformity, no open wounds Fires deltoid, biceps, triceps, wrist extensors, wrist flexors, finger extensors, finger flexors  AIN/PIN/IO intact  Palpable radial pulse  Sensation intact to light touch in median/ulnar/radial/axillary nerve distributions  Hand warm  and well perfused  -Left lower extremity  No tenderness to  palpation over extremity, no gross deformity, no pain with log roll Fires hip flexors, quadriceps, hamstrings, tibialis anterior, gastrocnemius and soleus, extensor hallucis longus Plantarflexes and dorsiflexes toes Sensation intact to light touch in sural, saphenous, tibial, deep peroneal, and superficial peroneal nerve distributions Foot warm and well perfused  -Right lower extremity  No tenderness to palpation over extremity, except around the hip. Pain with log roll. No open wounds Fires quadriceps, hamstrings, tibialis anterior, gastrocnemius and soleus, extensor hallucis longus Does not fire hip flexors due to pain Plantarflexes and dorsiflexes toes Sensation intact to light touch in sural, saphenous, tibial, deep peroneal, and superficial peroneal nerve distributions Foot warm and well perfused  Imaging: XR of the right hip from 03/15/2023 was independently reviewed and interpreted, showing a comminuted and displaced intertrochanteric femur fracture. Varus alignment. No other fractures seen.    Patient name: Christine Middleton Patient MRN: [id number] Date: 03/16/23

## 2023-03-16 NOTE — Progress Notes (Signed)
  Echocardiogram 2D Echocardiogram has been performed.  [name] 03/16/2023, 2:12 PM

## 2023-03-16 NOTE — Progress Notes (Signed)
ANTICOAGULATION CONSULT NOTE - Initial Consult  Pharmacy Consult for heparin Indication: atrial fibrillation - bridging while awaiting surgery  Allergies  Allergen Reactions   Amoxicillin     REACTION: rash   Codeine    Lipitor [Atorvastatin]     Muscle pain, stiff joints   Lisinopril    Nitrofurantoin    Tramadol Anxiety    Sedation, worsening depression.     Patient Measurements: Height: 5\' 2"  (157.5 cm) Weight: 61.7 kg (136 lb 0.4 oz) IBW/kg (Calculated) : 50.1 Heparin Dosing Weight: 61.7 kg   Vital Signs: Temp: 97.7 F (36.5 C) (08/15 2238) Temp Source: Oral (08/15 2238) BP: 146/81 (08/15 2310) Pulse Rate: 89 (08/15 2310)  Labs: Recent Labs    03/15/23 2248  HGB 10.9*  HCT 34.2*  PLT 201  LABPROT 16.4*  INR 1.3*  CREATININE 1.22*    Estimated Creatinine Clearance: 22.2 mL/min (A) (by C-G formula based on SCr of 1.22 mg/dL (H)).   Medical History: Past Medical History:  Diagnosis Date   Blood transfusion without reported diagnosis    Diverticulitis    Esophageal stricture    GERD (gastroesophageal reflux disease)    Hyperlipidemia    Hypertension    Shingles    Stroke (HCC)    Vitamin B 12 deficiency     Medications:  Scheduled:   Assessment: 29 yof presenting with hip pain s/p fall. On apixaban PTA for hx Afib (LD 8/15 ~1300). Head CT neg. Imaging is showing R intertrochanteric fx.   Hgb 10.9, plt 201. INR 1.3 - qualitatively increased by apixaban. Given recent DOAC, will monitor both aPTT and heparin level until correlate.   Goal of Therapy:  Heparin level 0.3-0.7 units/ml aPTT 66-102 seconds Monitor platelets by anticoagulation protocol: Yes   Plan:  Start heparin infusion at 850 units/hr on 8/16 Order heparin level/aPTT in 8 hr Monitor daily aPTT/HL until correlate, CBC, and for s/sx of bleeding  F/u timing of surgery - heparin will need held 6 hr pre-op  Thank you for allowing pharmacy to participate in this patient's  care,  Sherron Monday, PharmD, BCCCP Clinical Pharmacist  Phone: [phone] 03/16/2023 1:04 AM  Please check AMION for all [facility] Pharmacy phone numbers After 10:00 PM, call Main Pharmacy [phone]

## 2023-03-16 NOTE — TOC CAGE-AID Note (Signed)
Transition of Care [facility]) - CAGE-AID Screening  Patient Details  Name: Christine Middleton MRN: [id number] Date of Birth: 14-Aug-1922  Clinical Narrative:  Patient denies any alcohol or drug use. No need for substance abuse resources at this time.  CAGE-AID Screening:    Have You Ever Felt You Ought to Cut Down on Your Drinking or Drug Use?: No Have People Annoyed You By Critizing Your Drinking Or Drug Use?: No Have You Felt Bad Or Guilty About Your Drinking Or Drug Use?: No Have You Ever Had a Drink or Used Drugs First Thing In The Morning to Steady Your Nerves or to Get Rid of a Hangover?: No CAGE-AID Score: 0  Substance Abuse Education Offered: No

## 2023-03-16 NOTE — Progress Notes (Signed)
Patient has arrived on unit. Pt is alert and oriented but in pain. Vital signs are stable.

## 2023-03-16 NOTE — Progress Notes (Signed)
PT Cancellation Note  Patient Details Name: Christine Middleton MRN: [id number] DOB: [birth year]   Cancelled Treatment:    Reason Eval/Treat Not Completed: (P) Active bedrest order;Medical issues which prohibited therapy Pt is awaiting surgery this afternoon. PT will follow back for Evaluation after surgery.  Leveda Kendrix B. Beverely Risen PT, DPT Acute Rehabilitation Services Please use secure chat or  Call Office [phone]    Elon Alas [facility] 03/16/2023, 9:26 AM

## 2023-03-17 ENCOUNTER — Inpatient Hospital Stay (HOSPITAL_COMMUNITY): Payer: Medicare Other | Admitting: Anesthesiology

## 2023-03-17 ENCOUNTER — Inpatient Hospital Stay (HOSPITAL_COMMUNITY): Payer: Medicare Other

## 2023-03-17 ENCOUNTER — Encounter (HOSPITAL_COMMUNITY): Payer: Self-pay | Admitting: Family Medicine

## 2023-03-17 ENCOUNTER — Other Ambulatory Visit: Payer: Self-pay

## 2023-03-17 ENCOUNTER — Encounter (HOSPITAL_COMMUNITY)
Admission: EM | Disposition: A | Discharge status: Skilled Nursing Facility | Payer: Self-pay | Source: Home / Self Care | Attending: Family Medicine

## 2023-03-17 DIAGNOSIS — S72141A Displaced intertrochanteric fracture of right femur, initial encounter for closed fracture: Secondary | ICD-10-CM | POA: Diagnosis not present

## 2023-03-17 DIAGNOSIS — E785 Hyperlipidemia, unspecified: Secondary | ICD-10-CM

## 2023-03-17 DIAGNOSIS — I10 Essential (primary) hypertension: Secondary | ICD-10-CM

## 2023-03-17 DIAGNOSIS — F418 Other specified anxiety disorders: Secondary | ICD-10-CM

## 2023-03-17 HISTORY — PX: INTRAMEDULLARY (IM) NAIL INTERTROCHANTERIC: SHX5875

## 2023-03-17 LAB — BASIC METABOLIC PANEL
Anion gap: 11 (ref 5–15)
BUN: 19 mg/dL (ref 8–23)
CO2: 24 mmol/L (ref 22–32)
Calcium: 8.9 mg/dL (ref 8.9–10.3)
Chloride: 102 mmol/L (ref 98–111)
Creatinine, Ser: 0.97 mg/dL (ref 0.44–1.00)
GFR, Estimated: 53 mL/min — ABNORMAL LOW (ref >60)
Glucose, Bld: 132 mg/dL — ABNORMAL HIGH (ref 70–99)
Potassium: 3.7 mmol/L (ref 3.5–5.1)
Sodium: 137 mmol/L (ref 135–145)

## 2023-03-17 LAB — CBC
HCT: 32.2 % — ABNORMAL LOW (ref 36.0–46.0)
Hemoglobin: 9.9 g/dL — ABNORMAL LOW (ref 12.0–15.0)
MCH: 28.8 pg (ref 26.0–34.0)
MCHC: 30.7 g/dL (ref 30.0–36.0)
MCV: 93.6 fL (ref 80.0–100.0)
Platelets: 168 10*3/uL (ref 150–400)
RBC: 3.44 MIL/uL — ABNORMAL LOW (ref 3.87–5.11)
RDW: 18.7 % — ABNORMAL HIGH (ref 11.5–15.5)
WBC: 10.9 10*3/uL — ABNORMAL HIGH (ref 4.0–10.5)
nRBC: 0 % (ref 0.0–0.2)

## 2023-03-17 LAB — HEPARIN LEVEL (UNFRACTIONATED): Heparin Unfractionated: 0.98 [IU]/mL — ABNORMAL HIGH (ref 0.30–0.70)

## 2023-03-17 LAB — APTT: aPTT: 106 s — ABNORMAL HIGH (ref 24–36)

## 2023-03-17 LAB — GLUCOSE, CAPILLARY
Glucose-Capillary: 112 mg/dL — ABNORMAL HIGH (ref 70–99)
Glucose-Capillary: 122 mg/dL — ABNORMAL HIGH (ref 70–99)
Glucose-Capillary: 151 mg/dL — ABNORMAL HIGH (ref 70–99)

## 2023-03-17 SURGERY — FIXATION, FRACTURE, INTERTROCHANTERIC, WITH INTRAMEDULLARY ROD
Anesthesia: General | Laterality: Right

## 2023-03-17 MED ORDER — TRANEXAMIC ACID-NACL 1000-0.7 MG/100ML-% IV SOLN: 1000.0000 mg | Freq: Once | INTRAVENOUS | Status: DC

## 2023-03-17 MED ORDER — ONDANSETRON HCL 4 MG/2ML IJ SOLN: 4.0000 mg | Freq: Once | INTRAMUSCULAR | Status: DC | PRN

## 2023-03-17 MED ORDER — LIDOCAINE 2% (20 MG/ML) 5 ML SYRINGE
INTRAMUSCULAR | Status: DC | PRN
  Administered 2023-03-17: 60 mg via INTRAVENOUS

## 2023-03-17 MED ORDER — FENTANYL CITRATE (PF) 250 MCG/5ML IJ SOLN
INTRAMUSCULAR | Status: AC
  Filled 2023-03-17: qty 5

## 2023-03-17 MED ORDER — FENTANYL CITRATE (PF) 100 MCG/2ML IJ SOLN
INTRAMUSCULAR | Status: AC
  Filled 2023-03-17: qty 2

## 2023-03-17 MED ORDER — DEXAMETHASONE SODIUM PHOSPHATE 10 MG/ML IJ SOLN
INTRAMUSCULAR | Status: DC | PRN
  Administered 2023-03-17: 10 mg via INTRAVENOUS

## 2023-03-17 MED ORDER — ASPIRIN 81 MG PO TBEC
81.0000 mg | DELAYED_RELEASE_TABLET | Freq: Two times a day (BID) | ORAL | Status: DC
  Administered 2023-03-17: 81 mg via ORAL
  Filled 2023-03-17: qty 1

## 2023-03-17 MED ORDER — ORAL CARE MOUTH RINSE: 15.0000 mL | Freq: Once | OROMUCOSAL | Status: AC

## 2023-03-17 MED ORDER — OXYCODONE HCL 5 MG PO TABS
2.5000 mg | ORAL_TABLET | ORAL | Status: DC | PRN
  Administered 2023-03-17 – 2023-03-18 (×2): 5 mg via ORAL
  Filled 2023-03-17 (×2): qty 1

## 2023-03-17 MED ORDER — PHENYLEPHRINE 80 MCG/ML (10ML) SYRINGE FOR IV PUSH (FOR BLOOD PRESSURE SUPPORT)
PREFILLED_SYRINGE | INTRAVENOUS | Status: DC | PRN
  Administered 2023-03-17 (×3): 80 ug via INTRAVENOUS
  Administered 2023-03-17: 40 ug via INTRAVENOUS
  Administered 2023-03-17 (×2): 80 ug via INTRAVENOUS

## 2023-03-17 MED ORDER — LABETALOL HCL 5 MG/ML IV SOLN
INTRAVENOUS | Status: AC
  Filled 2023-03-17: qty 4

## 2023-03-17 MED ORDER — CHLORHEXIDINE GLUCONATE 0.12 % MT SOLN: 15.0000 mL | Freq: Once | OROMUCOSAL | Status: AC

## 2023-03-17 MED ORDER — ACETAMINOPHEN 325 MG PO TABS
650.0000 mg | ORAL_TABLET | Freq: Three times a day (TID) | ORAL | Status: DC
  Administered 2023-03-17 – 2023-03-20 (×7): 650 mg via ORAL
  Filled 2023-03-17 (×8): qty 2

## 2023-03-17 MED ORDER — SUGAMMADEX SODIUM 200 MG/2ML IV SOLN
INTRAVENOUS | Status: DC | PRN
  Administered 2023-03-17: 200 mg via INTRAVENOUS

## 2023-03-17 MED ORDER — PROPOFOL 10 MG/ML IV BOLUS
INTRAVENOUS | Status: AC
  Filled 2023-03-17: qty 20

## 2023-03-17 MED ORDER — PROPOFOL 10 MG/ML IV BOLUS
INTRAVENOUS | Status: DC | PRN
Start: 2023-03-17 — End: 2023-03-17
  Administered 2023-03-17: 60 mg via INTRAVENOUS

## 2023-03-17 MED ORDER — CHLORHEXIDINE GLUCONATE 0.12 % MT SOLN
OROMUCOSAL | Status: AC
  Administered 2023-03-17: 15 mL via OROMUCOSAL
  Filled 2023-03-17: qty 15

## 2023-03-17 MED ORDER — TRANEXAMIC ACID-NACL 1000-0.7 MG/100ML-% IV SOLN
INTRAVENOUS | Status: AC
  Filled 2023-03-17: qty 100

## 2023-03-17 MED ORDER — ONDANSETRON HCL 4 MG/2ML IJ SOLN
INTRAMUSCULAR | Status: DC | PRN
Start: 2023-03-17 — End: 2023-03-17
  Administered 2023-03-17: 4 mg via INTRAVENOUS

## 2023-03-17 MED ORDER — ROCURONIUM BROMIDE 10 MG/ML (PF) SYRINGE
PREFILLED_SYRINGE | INTRAVENOUS | Status: DC | PRN
  Administered 2023-03-17: 50 mg via INTRAVENOUS

## 2023-03-17 MED ORDER — LACTATED RINGERS IV SOLN: INTRAVENOUS | Status: DC

## 2023-03-17 MED ORDER — 0.9 % SODIUM CHLORIDE (POUR BTL) OPTIME
TOPICAL | Status: DC | PRN
  Administered 2023-03-17: 1000 mL

## 2023-03-17 MED ORDER — APIXABAN 2.5 MG PO TABS
2.5000 mg | ORAL_TABLET | Freq: Two times a day (BID) | ORAL | Status: DC
  Administered 2023-03-17 – 2023-03-20 (×6): 2.5 mg via ORAL
  Filled 2023-03-17 (×6): qty 1

## 2023-03-17 MED ORDER — ACETAMINOPHEN 10 MG/ML IV SOLN
INTRAVENOUS | Status: AC
  Filled 2023-03-17: qty 100

## 2023-03-17 MED ORDER — ACETAMINOPHEN 10 MG/ML IV SOLN
INTRAVENOUS | Status: DC | PRN
  Administered 2023-03-17: 1000 mg via INTRAVENOUS

## 2023-03-17 MED ORDER — FENTANYL CITRATE (PF) 250 MCG/5ML IJ SOLN
INTRAMUSCULAR | Status: DC | PRN
  Administered 2023-03-17: 50 ug via INTRAVENOUS

## 2023-03-17 MED ORDER — FENTANYL CITRATE (PF) 100 MCG/2ML IJ SOLN
25.0000 ug | INTRAMUSCULAR | Status: DC | PRN
  Administered 2023-03-17: 50 ug via INTRAVENOUS

## 2023-03-17 MED ORDER — CEFAZOLIN SODIUM-DEXTROSE 2-4 GM/100ML-% IV SOLN
2.0000 g | Freq: Three times a day (TID) | INTRAVENOUS | Status: AC
  Administered 2023-03-17 – 2023-03-18 (×3): 2 g via INTRAVENOUS
  Filled 2023-03-17 (×3): qty 100

## 2023-03-17 MED ORDER — LABETALOL HCL 5 MG/ML IV SOLN
10.0000 mg | Freq: Once | INTRAVENOUS | Status: AC
  Administered 2023-03-17: 10 mg via INTRAVENOUS

## 2023-03-17 MED ORDER — CEFAZOLIN SODIUM-DEXTROSE 2-3 GM-%(50ML) IV SOLR
INTRAVENOUS | Status: DC | PRN
Start: 2023-03-17 — End: 2023-03-17
  Administered 2023-03-17: 2 g via INTRAVENOUS

## 2023-03-17 MED ORDER — CEFAZOLIN SODIUM-DEXTROSE 2-4 GM/100ML-% IV SOLN
INTRAVENOUS | Status: AC
  Filled 2023-03-17: qty 100

## 2023-03-17 SURGICAL SUPPLY — 39 items
ADH SKN CLS LQ APL DERMABOND (GAUZE/BANDAGES/DRESSINGS) ×1
BIT DRILL INTERTAN LAG SCREW (BIT) IMPLANT
BIT DRILL LONG 4.0 (BIT) IMPLANT
BNDG COHESIVE 6X5 TAN NS LF (GAUZE/BANDAGES/DRESSINGS) ×1 IMPLANT
CANISTER SUCT 3000ML PPV (MISCELLANEOUS) ×1 IMPLANT
COVER PERINEAL POST (MISCELLANEOUS) ×1 IMPLANT
COVER SURGICAL LIGHT HANDLE (MISCELLANEOUS) ×1 IMPLANT
DERMABOND ADVANCED .7 DNX6 (GAUZE/BANDAGES/DRESSINGS) IMPLANT
DRAPE C-ARM 42X72 X-RAY (DRAPES) ×1 IMPLANT
DRAPE STERI IOBAN 125X83 (DRAPES) ×1 IMPLANT
DRILL BIT LONG 4.0 (BIT) ×1
DRSG TEGADERM 4X4.5 CHG (GAUZE/BANDAGES/DRESSINGS) IMPLANT
DURAPREP 26ML APPLICATOR (WOUND CARE) ×1 IMPLANT
ELECT REM PT RETURN 9FT ADLT (ELECTROSURGICAL) ×1
ELECTRODE REM PT RTRN 9FT ADLT (ELECTROSURGICAL) ×1 IMPLANT
GAUZE PAD ABD 8X10 STRL (GAUZE/BANDAGES/DRESSINGS) ×1 IMPLANT
GAUZE SPONGE 4X4 12PLY STRL (GAUZE/BANDAGES/DRESSINGS) ×1 IMPLANT
GAUZE XEROFORM 1X8 LF (GAUZE/BANDAGES/DRESSINGS) ×1 IMPLANT
GLOVE BIOGEL PI IND STRL 8 (GLOVE) ×1 IMPLANT
GLOVE PI ORTHO PRO STRL 7.5 (GLOVE) ×1 IMPLANT
GOWN STRL REUS W/TWL XL LVL3 (GOWN DISPOSABLE) ×1 IMPLANT
GUIDE PIN 3.2X343 (PIN) ×2
GUIDE PIN 3.2X343MM (PIN) ×2
GUIDE ROD 3.0 (MISCELLANEOUS) ×1
KIT BASIN OR (CUSTOM PROCEDURE TRAY) ×1 IMPLANT
KIT TURNOVER KIT B (KITS) ×1 IMPLANT
MANIFOLD NEPTUNE II (INSTRUMENTS) ×1 IMPLANT
NAIL TRIGEN INTERTAN 10X18CM (Nail) IMPLANT
NS IRRIG 1000ML POUR BTL (IV SOLUTION) ×1 IMPLANT
PACK GENERAL/GYN (CUSTOM PROCEDURE TRAY) ×1 IMPLANT
PAD ARMBOARD 7.5X6 YLW CONV (MISCELLANEOUS) ×1 IMPLANT
PIN GUIDE 3.2X343MM (PIN) IMPLANT
ROD GUIDE 3.0 (MISCELLANEOUS) IMPLANT
SCREW LAG COMPR KIT 90/85 (Screw) IMPLANT
SCREW TRIGEN LOW PROF 5.0X27.5 (Screw) IMPLANT
SUT VIC AB 0 CT1 18XCR BRD 8 (SUTURE) ×1 IMPLANT
SUT VIC AB 0 CT1 8-18 (SUTURE) ×1
SUT VIC AB 2-0 CT1 18 (SUTURE) ×1 IMPLANT
WATER STERILE IRR 1000ML POUR (IV SOLUTION) ×1 IMPLANT

## 2023-03-17 NOTE — H&P (Signed)
Orthopedic Surgery H&P Update  Patient's history and physical reviewed - no updates at this time. Has a right intertrochanteric femur fracture. Planning for fixation today. Spoke with patient and her son Ramon Dredge) over the phone for consent.  Risks of surgery were covered again, patient and then son on the phone elected to continue with planned procedure Written consent verified Ramon Dredge) Site marked Hold anticoagulation in anticipation of surgery Ancef and TXA on call to OR To OR when ready  Willia Craze, MD Orthopedic Surgeon

## 2023-03-17 NOTE — Progress Notes (Addendum)
Orthopedic Surgery Progress Note   Assessment: Patient is a 89 y.o. female with right intertrochanteric femur fracture s/p CMN   Plan: -Operative plans: complete -Diet: regular -Antibiotics: ancef x2 post-op doses -TXA x1 post-op dose -Weight bearing status: as tolerated -PT evaluate and treat -Pain control -Dispo: per primary  ___________________________________________________________________________  Subjective: No acute events since surgery. Recovering in PACU. Pain well controlled.    Physical Exam:  General: no acute distress, appears stated age Neurologic: drowsy, following commands with repeat instruction Respiratory: unlabored breathing on supplemental O2  MSK:   -Right lower extremity  Dressings over hip c/d/i EHL./TA/GSC intact Plantarflexes and dorsiflexes toes Sensation intact to light touch in sural, saphenous, tibial, deep peroneal, and superficial peroneal nerve distributions Foot warm and well perfused   Patient name: Christine Middleton Patient MRN: [id number] Date: 03/17/23

## 2023-03-17 NOTE — Anesthesia Procedure Notes (Signed)
Procedure Name: Intubation Date/Time: 03/17/2023 7:37 AM  Performed by: Macie Burows, CRNAPre-anesthesia Checklist: Patient identified, Emergency Drugs available, Suction available and Patient being monitored Patient Re-evaluated:Patient Re-evaluated prior to induction Oxygen Delivery Method: Circle system utilized Preoxygenation: Pre-oxygenation with 100% oxygen Induction Type: IV induction Ventilation: Mask ventilation without difficulty Laryngoscope Size: Glidescope and 3 Grade View: Grade I Tube type: Oral Tube size: 7.0 mm Number of attempts: 1 Airway Equipment and Method: Rigid stylet and Video-laryngoscopy Placement Confirmation: ETT inserted through vocal cords under direct vision, positive ETCO2 and breath sounds checked- equal and bilateral Secured at: 23 cm Tube secured with: Tape Dental Injury: Teeth and Oropharynx as per pre-operative assessment

## 2023-03-17 NOTE — Plan of Care (Signed)

## 2023-03-17 NOTE — Anesthesia Postprocedure Evaluation (Signed)
Anesthesia Post Note  Patient: Christine Middleton  Procedure(s) Performed: INTRAMEDULLARY (IM) NAIL INTERTROCHANTERIC (Right)     Patient location during evaluation: PACU Anesthesia Type: General Level of consciousness: awake and alert Pain management: pain level controlled Vital Signs Assessment: post-procedure vital signs reviewed and stable Respiratory status: spontaneous breathing, nonlabored ventilation, respiratory function stable and patient connected to nasal cannula oxygen Cardiovascular status: blood pressure returned to baseline and stable Postop Assessment: no apparent nausea or vomiting Anesthetic complications: no   No notable events documented.  Last Vitals:  Vitals:   03/17/23 1015 03/17/23 1038  BP: 134/78 132/78  Pulse: 80 79  Resp: 19 20  Temp: (!) 36.4 C 36.7 C  SpO2: 100% 98%    Last Pain:  Vitals:   03/17/23 1203  TempSrc:   PainSc: 3                  Collene Schlichter

## 2023-03-17 NOTE — Progress Notes (Signed)
ANTICOAGULATION CONSULT NOTE  Pharmacy Consult for heparin Indication: atrial fibrillation - bridging while awaiting surgery  Allergies  Allergen Reactions   Amoxicillin     REACTION: rash   Codeine    Lipitor [Atorvastatin]     Muscle pain, stiff joints   Lisinopril    Nitrofurantoin    Tramadol Anxiety    Sedation, worsening depression.     Patient Measurements: Height: 5\' 2"  (157.5 cm) Weight: 61.7 kg (136 lb 0.4 oz) IBW/kg (Calculated) : 50.1 Heparin Dosing Weight: 61.7 kg   Vital Signs: Temp: 98.2 F (36.8 C) (08/16 2006) Temp Source: Oral (08/16 2006) BP: 135/61 (08/16 2006) Pulse Rate: 81 (08/16 2006)  Labs: Recent Labs    03/15/23 2248 03/16/23 1424 03/17/23 0154  HGB 10.9* 9.7* 9.9*  HCT 34.2* 29.9* 32.2*  PLT 201 177 168  APTT  --  32 106*  LABPROT 16.4*  --   --   INR 1.3*  --   --   HEPARINUNFRC  --  0.73* 0.98*  CREATININE 1.22* 1.15* 0.97    Estimated Creatinine Clearance: 28 mL/min (by C-G formula based on SCr of 0.97 mg/dL).   Medical History: Past Medical History:  Diagnosis Date   Blood transfusion without reported diagnosis    Diverticulitis    Esophageal stricture    GERD (gastroesophageal reflux disease)    Hyperlipidemia    Hypertension    Shingles    Stroke (HCC)    Vitamin B 12 deficiency     Medications:  Scheduled:   atenolol  50 mg Oral Daily   feeding supplement  237 mL Oral BID BM   ferrous sulfate  325 mg Oral Q breakfast   losartan  25 mg Oral QPM   mirtazapine  7.5 mg Oral QHS   multivitamin with minerals  1 tablet Per Tube Daily   pantoprazole  40 mg Oral Daily    Assessment: 89 yof presenting with hip pain s/p fall. On apixaban PTA for hx Afib (LD 8/15 ~1300). Head CT neg. Imaging is showing R intertrochanteric fx.   Given recent DOAC, will monitor both aPTT and heparin level until correlate.   Heparin level elevated as expected with recent DOAC, aPTT also elevated at 106, on heparin infusion at 850  units/hr. Hgb 9.9, plt 168. No s/sx of bleeding or infusion issues. Heparin now stopped d/t need for surgery.  Goal of Therapy:  Heparin level 0.3-0.7 units/ml aPTT 66-102 seconds Monitor platelets by anticoagulation protocol: Yes   Plan:  Heparin off for surgery Follow up after procedure for [facility] plan  Thank you for allowing pharmacy to participate in this patient's care,  Sherron Monday, PharmD, BCCCP Clinical Pharmacist  Phone: [phone] 03/17/2023 3:47 AM  Please check AMION for all [facility] Pharmacy phone numbers After 10:00 PM, call Main Pharmacy [phone]

## 2023-03-17 NOTE — Transfer of Care (Addendum)
Immediate Anesthesia Transfer of Care Note  Patient: Christine Middleton  Procedure(s) Performed: INTRAMEDULLARY (IM) NAIL INTERTROCHANTERIC (Right)  Patient Location: PACU  Anesthesia Type:General  Level of Consciousness: awake and alert   Airway & Oxygen Therapy: Patient Spontanous Breathing and Patient connected to nasal cannula oxygen  Post-op Assessment: Report given to RN and Post -op Vital signs reviewed and stable  Post vital signs: Anesthesiologist aware of blood pressure  Last Vitals:  Vitals Value Taken Time  BP 190/111 03/17/23 0931  Temp 36.3 C 03/17/23 0930  Pulse 78 03/17/23 0934  Resp 21 03/17/23 0934  SpO2 95 % 03/17/23 0934  Vitals shown include unfiled device data.  Last Pain:  Vitals:   03/17/23 0642  TempSrc: Oral  PainSc:          Complications: No notable events documented.

## 2023-03-17 NOTE — Progress Notes (Signed)
Progress Note   Patient: Christine Middleton QMV:[id number] DOB: 04-May-1923 DOA: 03/15/2023     1 DOS: the patient was seen and examined on 03/17/2023      Subjective:  Patient seen and examined at bedside this morning Taking for surgical repair today by orthopedic surgeon Denies nausea vomiting abdominal pain   Brief hospital course: From HPI "Christine Middleton is a 89 y.o. female with a known history of diverticulitis, GERD, hypertension, hyperlipidemia, CVA, atrial fibrillation on Eliquis and B12 deficiency presents to the emergency department for evaluation of hip pain status post fall.  Patient was in a usual state of health until this afternoon she was trimming bushes outside in her yard when she tripped and fell.  She does not know how long she was there before her family found her around 9 PM.  She complains of mild right hip pain that is better after medication in the emergency department.   Patient lives at home with her adult son.  She is functionally independent, does not attempt to climb stairs.   Patient denies fevers/chills, weakness, dizziness, chest pain, shortness of breath, N/V/C/D, abdominal pain, dysuria/frequency, changes in mental status.    Otherwise there has been no change in status. Patient has been taking medication as prescribed and there has been no recent change in medication or diet.  No recent antibiotics.  There has been no recent illness, hospitalizations, travel or sick contacts.  "   Assessment and Plan: Right intertrochanteric hip fracture Plan of care discussed with orthopedic surgeon Patient underwent surgical repair today Continue current pain management Echo shows EF 65 to 70% Continue PT OT  Transition of care manager on board Patient's family's wishes is to get patient to a facility for rehab   #. Acute kidney injury  Avoid nephrotoxic agents Continue current IV fluid Monitor renal function closely Bladder scan and place foley catheter if evidence of  urinary retention   #.  Hyperglycemia without history of diabetes A1c 5.4 This confirms patient without having history of diabetes   #. History of atrial fibrillation - Continue atenolol -Resume Eliquis once cleared by surgery   #. History of GERD -Continue Protonix   #. History of iron deficiency anemia -Continue ferrous sulfate   Admission status: Inpatient IV Fluids: LR Diet/Nutrition: N.p.o. after 2 AM Consults called: Orthopedics Dr. Christell Constant consulted DVT Px: SCDs and early ambulation. Code Status: Full Code  Disposition Plan: To be determined     Physical Exam: GENERAL: Elderly female laying in bed in no acute distress CHEST: Clear to auscultation CARDIOVASCULAR: S1-S2 present ABDOMEN: Soft nondistended nontender.  EXTREMITIES: Right lower extremity is shortened and externally rotated leg is splinted no pedal edema, cyanosis, or clubbing. No calf tenderness or Homan's sign.  NEUROLOGIC: The patient is alert and oriented x 3. Cranial nerves II through XII are grossly intact with no focal sensorimotor deficit. PSYCHIATRIC:  Normal affect, mood, thought content. SKIN: Warm, dry, and intact without obvious rash, lesion, or ulcer.    Data Reviewed: I have personally reviewed the patient's CBC as well as BMP with results as shown below I have also reviewed orthopedic surgeon documentation as well as PT OT documentation    Latest Ref Rng & Units 03/17/2023    1:54 AM 03/16/2023    2:24 PM 03/15/2023   10:48 PM  CBC  WBC 4.0 - 10.5 K/uL 10.9  10.4  15.4   Hemoglobin 12.0 - 15.0 g/dL 9.9  9.7  28.4   Hematocrit 36.0 -  46.0 % 32.2  29.9  34.2   Platelets 150 - 400 K/uL 168  177  201        Latest Ref Rng & Units 03/17/2023    1:54 AM 03/16/2023    2:24 PM 03/15/2023   10:48 PM  BMP  Glucose 70 - 99 mg/dL 161  096  045   BUN 8 - 23 mg/dL 19  19  22    Creatinine 0.44 - 1.00 mg/dL 4.09  8.11  9.14   Sodium 135 - 145 mmol/L 137  137  137   Potassium 3.5 - 5.1 mmol/L 3.7   3.6  4.4   Chloride 98 - 111 mmol/L 102  102  100   CO2 22 - 32 mmol/L 24  25  21    Calcium 8.9 - 10.3 mg/dL 8.9  8.5  9.2      Vitals:   03/17/23 0945 03/17/23 1000 03/17/23 1015 03/17/23 1038  BP: (!) 199/89 116/84 134/78 132/78  Pulse: 79 83 80 79  Resp: 13 17 19 20   Temp:   (!) 97.5 F (36.4 C) 98 F (36.7 C)  TempSrc:      SpO2: 94% 100% 100% 98%  Weight:      Height:         Author: Loyce Dys, MD 03/17/2023 4:44 PM  For on call review [email or web address].

## 2023-03-17 NOTE — Progress Notes (Signed)
PT Cancellation Note  Patient Details Name: Christine Middleton MRN: [id number] DOB: Sep 05, 1922   Cancelled Treatment:    Reason Eval/Treat Not Completed: Patient at procedure or test/unavailable (Pt in OR.) PT to return as able, as appropriate to complete PT eval.  Lewis Shock, PT, DPT Acute Rehabilitation Services Secure chat preferred Office #: [phone]    [name] 03/17/2023, 7:21 AM

## 2023-03-17 NOTE — Op Note (Signed)
Orthopedic Surgery Operative Report   Procedure: Right intertrochanteric fracture intramedullary rodding   Modifier: none   Date of procedure: 03/17/2023   Patient name: Christine Middleton  MRN: [id number] DOB: [birth year]   Surgeon: Willia Craze, MD Assistant: None Pre-operative diagnosis: right intertrochanteric hip fracture Post-operative diagnosis: same as above Findings: right intertrochanteric femur fracture   Specimens: none Anesthesia: general EBL: 50cc Complications: none Pre-incision antibiotic: ancef TXA given prior to incision as well   Implants:  Implant Name Type Inv. Item Serial No. Manufacturer Lot No. LRB No. Used Action  NAIL Ot[employer] [id number] - [id number] Nail NAIL [employer] [id number] Right 1 Implanted  SCREW LAG COMPR KIT 90/85 - [id number] Screw SCREW LAG COMPR KIT 90/85  [facility] [employer] [id number] Right 1 Implanted      Indication for procedure: Patient is a 89 year old female who presented to the ER after a fall at home. The patient had right hip pain and x-rays revealed a intertrochanteric femur fracture. The patient was admitted to a medicine service with orthopedics consulted. I met the patient and discussed the fracture. I recommended operative management in the form of intramedullary rodding to stabilize the fracture and allow for mobilization. Explained the risks of this procedure included, but were not limited to: nonunion, malunion, fixation failure, infection, bleeding, stiffness, need for additional procedures, deep vein thrombosis, pulmonary embolism, MI, arrhythmia, and death. The alternatives of this surgery would be to treat the fracture with immobilization or to perform no intervention. After our discussion, patient elected to proceed with surgery. Since the patient had waned in terms of mental status on the morning of surgery, I called her son, Christine Middleton, on the morning of surgery and had the same  conversation as above. He elected to proceed on behalf of the patient. I also explained to him that the risk of mortality with hip fractures is 15% in this hospitalization and 30% at one year.    Procedure Description: The patient was met in the pre-operative holding area. The patient's identity and consent were verified. The operative site was marked by myself. The patient was brought back to the operating room. General anesthesia was induced and an endotracheal tube was placed by the anesthesia staff. The patient was transferred to the [facility] table. All bony prominences were well padded. Traction was applied and reduction was attempted with manipulation. Fluoroscopy confirmed a satisfactory reduction. The surgical area was cleansed with alcohol. Ancef and TXA were administered by anesthesia. The patient's skin was then prepped and draped in a standard, sterile fashion. A time out was performed that identified the patient, the procedure, and the operative site. All team members agreed with what was stated in the time out.    An incision was made just proximal and inferior to the greater trochanter. The incision was taken sharply down through the fascia. A guide pin was inserted into the wound onto the top of the greater trochanter. Fluoroscopy was used to place the guide pin at the starting point at the tip of the greater trochanter and in line with the middle of the femoral neck. The wire was then advanced to a point just past the lesser trochanter. A soft tissue sleeve was advanced over the wire onto the greater trochanter. An entry reamer was used to open the proximal femoral canal under fluoroscopic guidance. The pin and reamer were removed. A long guide wire was placed down the femoral canal. A 10x180 nail was  advanced over the guidewire under fluoroscopic guidance. The guidewire was removed.    An incision was made sharply through the skin, dermis, and fascia over the lateral thigh in the area where the  lag screws would be inserted. The lag screw targeter was placed through the jig onto the lateral femoral cortex. A guide wire was advanced through the lag screw targeter into the femoral head under fluoroscopic guidance. It was found to be in acceptable position on the AP and lateral views. The length of the lag screw was estimated off of the guide wire. A 90mm screw was selected. The inferior lag screw was drilled through the guide. The derotation device was placed through the targeter. The proximal lag screw hole was then drilled over the guide wire. The screw was inserted over the wire under fluoroscopic guidance. The derotation bar was removed and the inferior lag screw was inserted. AP and lateral fluoroscopic images confirmed satisfactory position of the screws in the femoral head.   An incision was made over the distal interlocking screw of the nail. Incision was taken sharply down through the skin, dermis, and fascia. A targeter was placed through the jig onto the lateral femoral cortex. A drill was used to drill the femur bicortically through the nail. The length of the screw was estimated off the drill. A 27.2mm screw was selected and inserted through the femur and distal hole of the nail. The jig was removed from the nail. Final AP and lateral fluoroscopic images confirmed satisfactory reduction and position of the fixation.    The wounds were copiously irrigated with sterile saline. The fascia was closed with 0 vicryl. The deep dermal layer was closed with 2-0 vicryl. The skin was closed with staples. Dressings were applied. All counts were correct at the end of the case. Patient was transferred back to a hospital bed. The patient was awakened from anesthesia and brought back to the post-anesthesia care unit in stable condition.     Post-operative plan: The patient will recover in the post-anesthesia care unit and then go to the floor on the medicine service. The patient will receive two  post-operative doses of ancef. The patient will get ASA 81mg  BID for DVT ppx. The patient will be weight bearing as tolerated. The patient will work with physical therapy. The patient's disposition will be determined by the medicine service.       Willia Craze, MD Orthopedic Surgeon

## 2023-03-17 NOTE — Discharge Instructions (Addendum)
Orthopedic Surgery Discharge Instructions  Patient name: Christine Middleton Fracture: right intertrochanteric femur fracture Procedure Performed: right hip cephalomedullary nail Date of Surgery: 03/17/2023 Surgeon: Willia Craze, MD  Activity: You are allowed to put as much weight on your leg as you would like. You can walk as much as you would like. You can perform household activities such as cleaning dishes, doing laundry, vacuuming, etc.  Incision Care: Your incision site has a dressing over it. That dressing should remain in place and dry at all times for a total of one week after surgery. After one week, you can remove the dressing. Underneath the dressing, you will find skin staples. You should leave these staples in place. They will be taken out in the office when the wound has healed. Do not pick, rub, or scrub at them. Do not put cream or lotion over the surgical area. After one week and once the dressing is off, it is okay to let soap and water run over your incision. Again, do not pick, scrub, or rub at the staples when bathing. Do not submerge (e.g., take a bath, swim, go in a hot tub, etc.) until six weeks after surgery. There may be some bloody drainage from the incision into the dressing after surgery. This is normal. You do not need to replace the dressing. Continue to leave it in place for the one week as instructed above. Should the dressing become saturated with blood or drainage, please call the office for further instructions.   Medications: You have been prescribed oxycodone. This is a narcotic pain medication and should only be taken as prescribed. You should not drink alcohol or operate heavy machinery (including driving) while taking this medication. The oxycodone can cause constipation as a side effect. For that reason, you have been prescribed senna and miralax. These are both laxatives. You do not need to take this medication if you develop diarrhea. Should you remain constipated  even while taking the senna and miralax, please use the miralax twice daily. Tylenol has been prescribed to be taken every 8 hours, which will give you additional pain relief.   You have been prescribed aspirin as a blood thinner. This medication is to be taken to prevent blood clots. Take 81 milligrams twice daily. You should refrain from using other blood thinners (warfarin, apixaban, plavix, xarelto, etc.) while using the aspirin. You will need to take this medication for a total of 6 weeks after your surgery.   You should not use over-the-counter NSAIDs (ibuprofen, Aleve, Celebrex, naproxen, meloxicam, etc.) for pain relief because aspirin is a similar medication. There can be side effects including but not limited to kidney injury and ulcers if you take these type of medications with the aspirin.  In order to set expectations for opioid prescriptions, you will only be prescribed opioids for a total of six weeks after surgery and, at two-weeks after surgery, your opioid prescription will start to tapered (decreased dosage and number of pills). If you have ongoing need for opioid medication six weeks after surgery, you will be referred to pain management. If you are already established with a provider that is giving you opioid medications, you should schedule an appointment with them for six weeks after surgery if you feel you are going to need another prescription. State law only allows for opioid prescriptions one week at a time. If you are running out of opioid medication near the end of the week, please call the office during business hours before  running out so I can send you another prescription.    Diet: You are safe to resume your regular diet after surgery.   Reasons to Call the Office After Surgery: You should feel free to call the office with any concerns or questions you have in the post-operative period, but you should definitely notify the office if you develop: -shortness of breath,  chest pain, or trouble breathing -excessive bleeding, drainage, redness, or swelling around the surgical site -fevers, chills, or pain that is getting worse with each passing day -persistent nausea or vomiting -new weakness in your leg, new or worsening numbness or tingling in your right leg -other concerns about your surgery  Follow Up Appointments: You have a follow up appointment with Dr. Christell Constant on 04/11/2023 at 1:45pm. At that visit, he will remove your staples and take x-rays of your hip to look at the fracture.   Office Information:  -Willia Craze, MD -Phone number: [phone] -Address: [address]       [place], [place] [address]

## 2023-03-17 NOTE — Anesthesia Preprocedure Evaluation (Addendum)
Anesthesia Evaluation  Patient identified by MRN, date of birth, ID band Patient awake    Reviewed: Allergy & Precautions, NPO status , Patient's Chart, lab work & pertinent test results, reviewed documented beta blocker date and time   Airway Mallampati: III  TM Distance: <3 FB Neck ROM: Full    Dental  (+) Dental Advisory Given, Poor Dentition   Pulmonary neg pulmonary ROS   Pulmonary exam normal breath sounds clear to auscultation       Cardiovascular hypertension, Pt. on home beta blockers and Pt. on medications Normal cardiovascular exam Rhythm:Regular Rate:Normal  Echo 03/16/23: 1. Left ventricular ejection fraction, by estimation, is 65 to 70%. The  left ventricle has normal function. The left ventricle has no regional  wall motion abnormalities. Left ventricular diastolic parameters are  consistent with Grade II diastolic  dysfunction (pseudonormalization). Elevated left atrial pressure.   2. Right ventricular systolic function is normal. The right ventricular  size is normal.   3. Left atrial size was moderately dilated.   4. Mild mitral valve regurgitation.   5. The aortic valve is tricuspid. Aortic valve regurgitation is mild.   6. The inferior vena cava is normal in size with greater than 50%  respiratory variability, suggesting right atrial pressure of 3 mmHg.     Neuro/Psych  PSYCHIATRIC DISORDERS Anxiety Depression     Neuromuscular disease CVA    GI/Hepatic Neg liver ROS,GERD  Medicated,,  Endo/Other  negative endocrine ROS    Renal/GU negative Renal ROS     Musculoskeletal Left hip Fracture   Abdominal   Peds  Hematology  (+) Blood dyscrasia (Eliquis), anemia   Anesthesia Other Findings Day of surgery medications reviewed with the patient.  Reproductive/Obstetrics                             Anesthesia Physical Anesthesia Plan  ASA: 3  Anesthesia Plan: General    Post-op Pain Management: Ofirmev IV (intra-op)*   Induction: Intravenous  PONV Risk Score and Plan: 3 and Dexamethasone and Ondansetron  Airway Management Planned: Oral ETT  Additional Equipment:   Intra-op Plan:   Post-operative Plan: Extubation in OR  Informed Consent: I have reviewed the patients History and Physical, chart, labs and discussed the procedure including the risks, benefits and alternatives for the proposed anesthesia with the patient or authorized representative who has indicated his/her understanding and acceptance.     Dental advisory given  Plan Discussed with: CRNA  Anesthesia Plan Comments:         Anesthesia Quick Evaluation

## 2023-03-17 NOTE — Progress Notes (Signed)
Orthopedic Surgery Progress Note   Assessment: Patient is a 89 y.o. female with right intertrochanteric femur fracture   Plan: -To OT today for operative fixation -Spoke with son, Ramon Dredge, today about surgery including the risks, benefits, and alternatives. After this discussion, he elected to proceed -Diet: NPO for procedure -Antibiotics: ancef on call to OR -TXA to OR as well -Weight bearing status: NWB RLE -PT evaluate and treat -Pain control -Dispo: pending completion of operative plans  ___________________________________________________________________________  Subjective: Was more delirious overnight. No events overnight. Pain well controlled. Understands fixing broken bone today but could not give more details about the plan.    Physical Exam:  General: no acute distress, appears stated age Neurologic: alert, following commands, confused at times Respiratory: unlabored breathing on room air, symmetric chest rise Psychiatric: appropriate affect, normal cadence to speech  MSK:   -Right lower extremity  Leg short when compared to contralateral side, pain with log roll EHL./TA/GSC intact Plantarflexes and dorsiflexes toes Sensation intact to light touch in sural, saphenous, tibial, deep peroneal, and superficial peroneal nerve distributions Foot warm and well perfused   Patient name: Christine Middleton Patient MRN: [id number] Date: 03/17/23

## 2023-03-18 DIAGNOSIS — S72141A Displaced intertrochanteric fracture of right femur, initial encounter for closed fracture: Secondary | ICD-10-CM | POA: Diagnosis not present

## 2023-03-18 LAB — BASIC METABOLIC PANEL
Anion gap: 11 (ref 5–15)
BUN: 14 mg/dL (ref 8–23)
CO2: 19 mmol/L — ABNORMAL LOW (ref 22–32)
Calcium: 8 mg/dL — ABNORMAL LOW (ref 8.9–10.3)
Chloride: 107 mmol/L (ref 98–111)
Creatinine, Ser: 1.03 mg/dL — ABNORMAL HIGH (ref 0.44–1.00)
GFR, Estimated: 49 mL/min — ABNORMAL LOW (ref >60)
Glucose, Bld: 114 mg/dL — ABNORMAL HIGH (ref 70–99)
Potassium: 4.1 mmol/L (ref 3.5–5.1)
Sodium: 137 mmol/L (ref 135–145)

## 2023-03-18 LAB — GLUCOSE, CAPILLARY
Glucose-Capillary: 105 mg/dL — ABNORMAL HIGH (ref 70–99)
Glucose-Capillary: 107 mg/dL — ABNORMAL HIGH (ref 70–99)
Glucose-Capillary: 125 mg/dL — ABNORMAL HIGH (ref 70–99)
Glucose-Capillary: 125 mg/dL — ABNORMAL HIGH (ref 70–99)
Glucose-Capillary: 139 mg/dL — ABNORMAL HIGH (ref 70–99)
Glucose-Capillary: 99 mg/dL (ref 70–99)

## 2023-03-18 LAB — CBC
HCT: 26.6 % — ABNORMAL LOW (ref 36.0–46.0)
Hemoglobin: 8.8 g/dL — ABNORMAL LOW (ref 12.0–15.0)
MCH: 29.3 pg (ref 26.0–34.0)
MCHC: 33.1 g/dL (ref 30.0–36.0)
MCV: 88.7 fL (ref 80.0–100.0)
Platelets: 111 10*3/uL — ABNORMAL LOW (ref 150–400)
RBC: 3 MIL/uL — ABNORMAL LOW (ref 3.87–5.11)
RDW: 18.6 % — ABNORMAL HIGH (ref 11.5–15.5)
WBC: 12 10*3/uL — ABNORMAL HIGH (ref 4.0–10.5)
nRBC: 0 % (ref 0.0–0.2)

## 2023-03-18 MED ORDER — ADULT MULTIVITAMIN W/MINERALS CH
1.0000 | ORAL_TABLET | Freq: Every day | ORAL | Status: DC
  Administered 2023-03-19 – 2023-03-20 (×2): 1 via ORAL
  Filled 2023-03-18 (×2): qty 1

## 2023-03-18 MED ORDER — VITAMIN B-12 100 MCG PO TABS
100.0000 ug | ORAL_TABLET | Freq: Every day | ORAL | Status: DC
  Administered 2023-03-18 – 2023-03-20 (×3): 100 ug via ORAL
  Filled 2023-03-18 (×3): qty 1

## 2023-03-18 MED ORDER — SENNOSIDES-DOCUSATE SODIUM 8.6-50 MG PO TABS: 1.0000 | ORAL_TABLET | Freq: Two times a day (BID) | ORAL | 0 refills | Status: AC

## 2023-03-18 MED ORDER — SENNOSIDES-DOCUSATE SODIUM 8.6-50 MG PO TABS
1.0000 | ORAL_TABLET | Freq: Every day | ORAL | Status: DC
  Administered 2023-03-18 – 2023-03-19 (×2): 1 via ORAL
  Filled 2023-03-18 (×2): qty 1

## 2023-03-18 MED ORDER — POLYETHYLENE GLYCOL 3350 17 G PO PACK: 17.0000 g | PACK | Freq: Every day | ORAL | 0 refills | Status: AC

## 2023-03-18 MED ORDER — ACETAMINOPHEN 325 MG PO TABS: 650.0000 mg | ORAL_TABLET | Freq: Three times a day (TID) | ORAL | 0 refills | Status: AC

## 2023-03-18 MED ORDER — OXYCODONE HCL 5 MG PO TABS: 2.5000 mg | ORAL_TABLET | ORAL | 0 refills | Status: AC | PRN

## 2023-03-18 MED ORDER — POLYETHYLENE GLYCOL 3350 17 G PO PACK: 17.0000 g | PACK | Freq: Every day | ORAL | Status: DC

## 2023-03-18 NOTE — Evaluation (Addendum)
Physical Therapy Evaluation Patient Details Name: Christine Middleton MRN: [id number] DOB: [birth year] Today's Date: 03/18/2023  History of Present Illness  The pt is a 89 yo female presenting 8/15 after a fall at home resulting in a R hip fx s/p IM nail 8/17. PMH includes: diverticulitis, GERD, HTN, HLD, CVA, and afib on Eliquis.   Clinical Impression  Pt in bed upon arrival of PT, agreeable to evaluation at this time. Prior to admission the pt was independent with mobility without use of DME, reports she does live with her son but he works part time and she is typically able to manage home alone while he is out. The pt presents today with significant confusion regarding time, situation (insisting she had a stroke Thursday that led to fall), but also aware she is getting her time confused. The pt required modA to complete bed mobility and sit-stand transfers with use of RW, was limited to ~64ft ambulation in the room due to fatigue and pain. Will continue to benefit from skilled PT acutely to progress functional strength, stability, and activity tolerance, but anticipate will need additional skilled PT at inpatient rehab <3 hours/day after d/c to facilitate return to maximal independence prior to return home.     If plan is discharge home, recommend the following: A lot of help with walking and/or transfers;A lot of help with bathing/dressing/bathroom;Assistance with cooking/housework;Direct supervision/assist for medications management;Direct supervision/assist for financial management;Assist for transportation;Help with stairs or ramp for entrance   Can travel by private vehicle   No    Equipment Recommendations Rolling walker (2 wheels)  Recommendations for Other Services  OT consult    Functional Status Assessment Patient has had a recent decline in their functional status and demonstrates the ability to make significant improvements in function in a reasonable and predictable amount of time.      Precautions / Restrictions Precautions Precautions: Fall Precaution Comments: pt admitted after a fall Restrictions Weight Bearing Restrictions: Yes RLE Weight Bearing: Weight bearing as tolerated      Mobility  Bed Mobility Overal bed mobility: Needs Assistance Bed Mobility: Rolling, Sidelying to Sit Rolling: Min assist Sidelying to sit: Mod assist, HOB elevated, Used rails       General bed mobility comments: modA to move LE to EOB and elevate trunk, pt holding bed rails to assist    Transfers Overall transfer level: Needs assistance Equipment used: Rolling walker (2 wheels) Transfers: Sit to/from Stand, Bed to chair/wheelchair/BSC Sit to Stand: Mod assist   Step pivot transfers: Mod assist       General transfer comment: modA to rise slowly, minimal wt through RLE. completed x2    Ambulation/Gait Ambulation/Gait assistance: Min assist Gait Distance (Feet): 4 Feet Assistive device: Rolling walker (2 wheels) Gait Pattern/deviations: Step-to pattern, Decreased stance time - right, Decreased stride length, Decreased weight shift to right, Shuffle Gait velocity: decreased Gait velocity interpretation: <1.31 ft/sec, indicative of household ambulator Pre-gait activities: standing wt shift and marching General Gait Details: significant trunk flexed and minimal clearance. minimal wt shift to RLE, but no overt buckling     Balance Overall balance assessment: Needs assistance Sitting-balance support: Single extremity supported, Feet supported Sitting balance-Leahy Scale: Fair     Standing balance support: Bilateral upper extremity supported, During functional activity Standing balance-Leahy Scale: Poor Standing balance comment: dependent on BUE support  Pertinent Vitals/Pain Pain Assessment Pain Assessment: Faces Faces Pain Scale: Hurts little more Pain Location: R hip with movement Pain Descriptors / Indicators:  Discomfort, Aching Pain Intervention(s): Limited activity within patient's tolerance, Monitored during session, Repositioned    Home Living Family/patient expects to be discharged to:: Private residence Living Arrangements: Children Available Help at Discharge: Family;Available 24 hours/day Type of Home: House Home Access: Ramped entrance       Home Layout: Multi-level;Able to live on main level with bedroom/bathroom Home Equipment: Rolling Walker (2 wheels);Rollator (4 wheels);BSC/3in1;Cane - single point;Tub bench Additional Comments: pt lives with son who works part time but has other children that come over daily    Prior Function Prior Level of Function : Independent/Modified Independent             Mobility Comments: uses SPC when going outside, doesn't drive ADLs Comments: indep, pt reports she helps with some cooking and household chores. family assists with medicines (pill box)     Extremity/Trunk Assessment   Upper Extremity Assessment Upper Extremity Assessment: Generalized weakness    Lower Extremity Assessment Lower Extremity Assessment: RLE deficits/detail;LLE deficits/detail RLE Deficits / Details: pain to ROM at hip and knee, able to move toes and denies change in sensation. grossly 3+/5 RLE: Unable to fully assess due to pain RLE Sensation: WNL RLE Coordination: decreased gross motor LLE Deficits / Details: grossly 4+/5 to MMT, no change in sensation. LLE Sensation: WNL    Cervical / Trunk Assessment Cervical / Trunk Assessment: Kyphotic  Communication   Communication Communication: No apparent difficulties Cueing Techniques: Verbal cues;Tactile cues;Gestural cues  Cognition Arousal: Alert Behavior During Therapy: WFL for tasks assessed/performed Overall Cognitive Status: No family/caregiver present to determine baseline cognitive functioning                                 General Comments: pt disoriented to time, confusing PT with  family members, aware she fell and broke her hip but with poor insight to timing, states she also had a stroke. able to follow simple cues but needs instructions for all mobility        General Comments General comments (skin integrity, edema, etc.): VSS on RA    Exercises General Exercises - Lower Extremity Ankle Circles/Pumps: AROM, Both, 10 reps, Supine Heel Slides: AAROM, Both, 10 reps, Supine Hip ABduction/ADduction: AAROM, Both, 10 reps, Supine   Assessment/Plan    PT Assessment Patient needs continued PT services  PT Problem List Decreased strength;Decreased range of motion;Decreased activity tolerance;Decreased balance;Decreased mobility;Decreased coordination;Decreased cognition;Decreased safety awareness;Pain       PT Treatment Interventions DME instruction;Stair training;Gait training;Functional mobility training;Therapeutic activities;Therapeutic exercise;Balance training;Patient/family education    PT Goals (Current goals can be found in the Care Plan section)  Acute Rehab PT Goals Patient Stated Goal: return home PT Goal Formulation: With patient Time For Goal Achievement: 04/01/23 Potential to Achieve Goals: Good    Frequency Min 1X/week        AM-PAC PT "6 Clicks" Mobility  Outcome Measure Help needed turning from your back to your side while in a flat bed without using bedrails?: A Little Help needed moving from lying on your back to sitting on the side of a flat bed without using bedrails?: A Little Help needed moving to and from a bed to a chair (including a wheelchair)?: A Lot Help needed standing up from a chair using your arms (e.g., wheelchair or bedside chair)?:  A Lot Help needed to walk in hospital room?: A Lot Help needed climbing 3-5 steps with a railing? : A Lot 6 Click Score: 14    End of Session Equipment Utilized During Treatment: Gait belt Activity Tolerance: Patient tolerated treatment well Patient left: in chair;with call bell/phone  within reach;with chair alarm set Nurse Communication: Mobility status PT Visit Diagnosis: Other abnormalities of gait and mobility (R26.89);Muscle weakness (generalized) (M62.81);Pain Pain - Right/Left: Right Pain - part of body: Hip    Time: 0825-0902 PT Time Calculation (min) (ACUTE ONLY): 37 min   Charges:   PT Evaluation $PT Eval Low Complexity: 1 Low PT Treatments $Gait Training: 8-22 mins PT General Charges $$ ACUTE PT VISIT: 1 Visit         Vickki Muff, PT, DPT   Acute Rehabilitation Department Office [phone] Secure Chat Communication Preferred  Ronnie Derby 03/18/2023, 11:21 AM

## 2023-03-18 NOTE — Progress Notes (Signed)
[facility] PROGRESS NOTE  Christine Middleton (DOB: [birth year]) UEA:[id number] PCP: Nelwyn Salisbury, MD  Brief Narrative: Christine Middleton is a 89 y.o. female with a history of PAF on eliquis, HTN, HLD, CVA, B12 deficiency, and GERD who presented to the ED on 03/15/2023 after tripping while trimming hedges. She fell on her right hip and had pain, inability to walk. Right intertrochanteric right hip fracture was confirmed by XR. She was admitted and had intramedullary nail placed by Dr. Christell Constant 8/17. Postoperative course complicated by acute delirium and anemia not requiring transfusion.   Subjective: Patient is pleasantly, talkatively confused, sees her cat in the room, etc. She recalls events leading to hospitalization. Her daughter Suzan Garibaldi, who is her HCPOA, is at the bedside this afternoon. Patient has no pain. Required assistance to get to chair. She is eating/drinking fine.   Objective: BP (!) 157/66 (BP Location: Right Arm)   Pulse 75   Temp 97.7 F (36.5 C)   Resp 18   Ht 5\' 2"  (1.575 m)   Wt 61.7 kg   SpO2 96%   BMI 24.88 kg/m   Gen: Elderly, pleasant female in no distress Pulm: Clear, nonlabored  CV: Regular without MRG or pitting edema GI: Soft, NT, ND, +BS  Neuro: Alert, talkative, interactive, incompletely oriented. No focal motor deficits throughout. Ext: Warm, no deformities.  Skin: R thigh mildly tender to palpation distally. No ecchymoses, compartment soft.    Assessment & Plan: Fall at home with closed right intertrochanteric femur fracture: now s/p IM nail 8/17 by Dr. Christell Constant.  - Continue PT/OT > planning SNF rehabilitation - VTE ppx with eliquis.  - WBAT - Pain control, multimodal, minimize narcotics, continue bowel regimen - Vitamin D level a few months ago was 66.79.   Acute delirium:  - Supportive care, counseling to family. Delirium precautions  Paroxysmal atrial fibrillation: NSR on admission ECG, HR remains regular.  - Continue atenolol - Continue  eliquis  HTN:  - Continued on losartan, atenolol.  GERD:  - PPI  ABLA on chronic iron deficiency anemia: Normocytic indices. Her baseline hgb appears to run 8-10 this year. Received TXA.  - Repeat H/H in AM, transfusion threshold without symptoms is likely 7g/dl.  - Restart iron with bowel regimen.  Thrombocytopenia: No history of this. - Monitor in AM.   Stage IIIb CKD with NAGMA:  - SCr near her chronic baseline. Aim to avoid nephrotoxins.  - Now that she's eating, will DC IVF to avoid overload.  - Monitor in AM.   History of vitamin B 12 deficiency: Last value was 261.  - Continue supplementation  Hyperglycemia: No diabetes, HbA1c 5.4%.   Tyrone Nine, MD [facility] [email or web address] 03/18/2023, 1:37 PM

## 2023-03-18 NOTE — Progress Notes (Signed)
Orthopedic Surgery Progress Note   Assessment: Patient is a 89 y.o. female with right intertrochanteric femur fracture s/p CMN   Plan: -Operative plans: complete -Diet: regular -Antibiotics: ancef x2 post-op doses -DVT ppx: home eliquis -Weight bearing status: as tolerated -PT evaluate and treat -Pain control -Dispo: per primary  ___________________________________________________________________________  Subjective: No acute events overnight. Having pain around the hip. Feels tylenol is controlling the pain. Has not tried getting out of bed yet.    Physical Exam:  General: no acute distress, appears stated age Neurologic: alert, answering questions appropriately, following commands Respiratory: unlabored breathing on room air  MSK:   -Right lower extremity  Dressings over hip c/d/i EHL./TA/GSC intact Plantarflexes and dorsiflexes toes Sensation intact to light touch in sural, saphenous, tibial, deep peroneal, and superficial peroneal nerve distributions Foot warm and well perfused   Patient name: Christine Middleton Patient MRN: [id number] Date: 03/18/23

## 2023-03-19 DIAGNOSIS — S72141A Displaced intertrochanteric fracture of right femur, initial encounter for closed fracture: Secondary | ICD-10-CM | POA: Diagnosis not present

## 2023-03-19 LAB — BASIC METABOLIC PANEL
Anion gap: 9 (ref 5–15)
BUN: 13 mg/dL (ref 8–23)
CO2: 21 mmol/L — ABNORMAL LOW (ref 22–32)
Calcium: 8.5 mg/dL — ABNORMAL LOW (ref 8.9–10.3)
Chloride: 107 mmol/L (ref 98–111)
Creatinine, Ser: 0.76 mg/dL (ref 0.44–1.00)
GFR, Estimated: >60 mL/min (ref >60)
Glucose, Bld: 107 mg/dL — ABNORMAL HIGH (ref 70–99)
Potassium: 3.7 mmol/L (ref 3.5–5.1)
Sodium: 137 mmol/L (ref 135–145)

## 2023-03-19 LAB — CBC
HCT: 28.1 % — ABNORMAL LOW (ref 36.0–46.0)
Hemoglobin: 9.1 g/dL — ABNORMAL LOW (ref 12.0–15.0)
MCH: 29.8 pg (ref 26.0–34.0)
MCHC: 32.4 g/dL (ref 30.0–36.0)
MCV: 92.1 fL (ref 80.0–100.0)
Platelets: 173 10*3/uL (ref 150–400)
RBC: 3.05 MIL/uL — ABNORMAL LOW (ref 3.87–5.11)
RDW: 18.8 % — ABNORMAL HIGH (ref 11.5–15.5)
WBC: 9.5 10*3/uL (ref 4.0–10.5)
nRBC: 0 % (ref 0.0–0.2)

## 2023-03-19 LAB — GLUCOSE, CAPILLARY
Glucose-Capillary: 101 mg/dL — ABNORMAL HIGH (ref 70–99)
Glucose-Capillary: 134 mg/dL — ABNORMAL HIGH (ref 70–99)

## 2023-03-19 MED ORDER — SENNOSIDES-DOCUSATE SODIUM 8.6-50 MG PO TABS
1.0000 | ORAL_TABLET | Freq: Two times a day (BID) | ORAL | Status: DC
  Administered 2023-03-19 – 2023-03-20 (×2): 1 via ORAL
  Filled 2023-03-19 (×2): qty 1

## 2023-03-19 NOTE — Care Management Important Message (Signed)
Important Message  Patient Details  Name: Christine Middleton MRN: [id number] Date of Birth: 07-May-1923   Medicare Important Message Given:  Yes     Sherilyn Banker 03/19/2023, 3:05 PM

## 2023-03-19 NOTE — TOC Initial Note (Addendum)
Transition of Care [facility]) - Initial/Assessment Note    Patient Details  Name: Christine Middleton MRN: [id number] Date of Birth: 10-08-1922  Transition of [facility]) CM/SW Contact:    Lorri Frederick, LCSW Phone Number: 03/19/2023, 11:10 AM  Clinical Narrative:    Pt oriented x1-2 per epic, but able to participate in conversation.  Pt son Gerlene Burdock also present.  Permission given to speak with all three of pt children: Lanny Cramp, daughter Eber Jones.  CSW discussed PT recommendation for SNF.  They are agreeable.  Medicare choice document provided.  Richard does not want referral sent to [facility].  First choice would be [facility].  Permission given to send out referral in hub.  Pt from home with son Ramon Dredge, no current services.            Referral sent out in hub.  CSW reached out to Qu[facility] to review.    1145: CSW has multiple bed offers, Richard no longer in the room.  CSW spoke with daughter Eber Jones, bed offers provided.  CSW assisted her with accessing medicare.gov website to look at ratings.  She will discuss with her brothers and call back with SNF choice.   1230: TC [name]: would like to accept offer at [facility].  CSW confirmed with Michelle/Ashton that they can receive pt today.  Auth request submitted in [employer].    PASSR received: [id number]      Expected Discharge Plan: Skilled Nursing Facility Barriers to Discharge: SNF Pending bed offer   Patient Goals and CMS Choice Patient states their goals for this hospitalization and ongoing recovery are:: back to normal activities CMS Medicare.gov Compare Post Acute Care list provided to:: Patient Choice offered to / list presented to : Patient, Adult Children (son Gerlene Burdock)      Expected Discharge Plan and Services In-house Referral: Clinical Social Work   Post Acute Care Choice: Skilled Nursing Facility Living arrangements for the past 2 months: Single Family Home                                       Prior Living Arrangements/Services Living arrangements for the past 2 months: Single Family Home Lives with:: Adult Children (lives with son Ramon Dredge) Patient language and need for interpreter reviewed:: Yes Do you feel safe going back to the place where you live?: Yes      Need for Family Participation in Patient Care: Yes (Comment) Care giver support system in place?: Yes (comment) Current home services: Other (comment) (none) Criminal Activity/Legal Involvement Pertinent to Current Situation/Hospitalization: No - Comment as needed  Activities of Daily Living      Permission Sought/Granted Permission sought to share information with : Family Supports Permission granted to share information with : Yes, Verbal Permission Granted  Share Information with NAME: sons Ramon Dredge and Gerlene Burdock, daughter Eber Jones  Permission granted to share info w AGENCY: SNF        Emotional Assessment Appearance:: Appears stated age Attitude/Demeanor/Rapport: Engaged Affect (typically observed): Appropriate, Pleasant Orientation: : Oriented to Self, Oriented to Place      Admission diagnosis:  Chronic anticoagulation [Z79.01] Renal insufficiency [N28.9] Normochromic normocytic anemia [D64.9] Closed intertrochanteric fracture of right hip, initial encounter (HCC) [S72.141A] Closed intertrochanteric fracture of hip, right, initial encounter [facility]) [S72.141A] Patient Active Problem List   Diagnosis Date Noted   Closed intertrochanteric fracture of right hip, initial encounter (HCC) 03/16/2023   Closed intertrochanteric  fracture of hip, right, initial encounter (HCC) 03/16/2023   Internal hemorrhoids 12/21/2022   Long term current use of anticoagulant therapy 12/20/2022   Vitamin D deficiency 11/09/2021   Paroxysmal atrial fibrillation (HCC) 11/09/2021   Iron deficiency anemia 10/28/2020   Symptomatic anemia 10/22/2020   Shingles 10/22/2020   Closed compression fracture of body of L1 vertebra (HCC)  09/04/2020   Fall at home, initial encounter 09/04/2020   Acute post-hemorrhagic anemia 09/04/2020   Rectal bleeding 07/08/2019   Depression with anxiety 09/26/2017   Pedal edema 10/18/2016   TMJ (dislocation of temporomandibular joint) 09/18/2016   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   Cerebrovascular accident (CVA) (HCC)    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/19/2016   Facial droop 08/18/2016   HYPERGLYCEMIA 06/30/2010   Cerebral artery occlusion with cerebral infarction (HCC) 07/19/2009   Renal insufficiency 07/19/2009   Polymyalgia rheumatica (HCC) 07/19/2009   B12 deficiency 06/30/2009   Hereditary and idiopathic peripheral neuropathy 06/28/2009   External hemorrhoids 12/03/2008   ESOPHAGEAL STRICTURE 11/30/2008   Diverticulosis of colon 11/30/2008   GROSS HEMATURIA 11/04/2008   MYALGIA 11/04/2008   Constipation 06/24/2008   HLD (hyperlipidemia) 05/30/2007   SKIN LESION 05/30/2007   Essential hypertension 03/19/2007   GERD 03/19/2007   PCP:  Nelwyn Salisbury, MD Pharmacy:   [id number] [place], [place] - [address] [place] [address] Phone: [phone] Fax: [phone]     Social Determinants of Health (SDOH) Social History: SDOH Screenings   Food Insecurity: No Food Insecurity (12/26/2022)  Housing: Low Risk  (06/29/2022)  Transportation Needs: No Transportation Needs (12/26/2022)  Utilities: Not At Risk (06/29/2022)  Alcohol Screen: Low Risk  (02/14/2021)  Depression (PHQ2-9): Low Risk  (01/05/2023)  Financial Resource Strain: Low Risk  (02/16/2022)  Physical Activity: Inactive (02/16/2022)  Social Connections: Moderately Integrated (02/16/2022)  Recent Concern: Social Connections - Moderately Isolated (02/06/2022)  Stress: No Stress Concern Present (02/16/2022)  Tobacco Use: Low Risk  (03/17/2023)   SDOH Interventions:     Readmission Risk Interventions     No data to display

## 2023-03-19 NOTE — Progress Notes (Signed)
[facility] PROGRESS NOTE  Christine Middleton (DOB: 1922/12/12) ZOX:[id number] PCP: Nelwyn Salisbury, MD  Brief Narrative: Christine Middleton is a 89 y.o. female with a history of PAF on eliquis, HTN, HLD, CVA, B12 deficiency, and GERD who presented to the ED on 03/15/2023 after tripping while trimming hedges. She fell on her right hip and had pain, inability to walk. Right intertrochanteric right hip fracture was confirmed by XR. She was admitted and had intramedullary nail placed by Dr. Christell Constant 8/17. Postoperative course complicated by acute delirium and anemia not requiring transfusion.   Subjective: Remains talkatively and pleasantly confused. No complaints of pain or other issues.   Objective: BP (!) 164/75 (BP Location: Left Arm)   Pulse 96   Temp (!) 97.4 F (36.3 C)   Resp 15   Ht 5\' 2"  (1.575 m)   Wt 61.7 kg   SpO2 93%   BMI 24.88 kg/m   Gen: No distress, elderly Pulm: Clear, nonlabored  CV: RRR, no MRG or pitting edema GI: Soft, NT, ND, +BS  Neuro: Alert and disoriented, moves all extremities, follows commands without focal deficits. Ext: Warm, no deformities Skin: R lateral thigh incisions with c/d/I dressings and no surrounding erythema or induration or ecchymoses.   Assessment & Plan: Fall at home with closed right intertrochanteric femur fracture: now s/p IM nail 8/17 by Dr. Christell Constant.  - Continue PT/OT > planning SNF rehabilitation - VTE ppx with eliquis.  - WBAT - Pain control, multimodal, minimize narcotics, continue bowel regimen - Vitamin D level a few months ago was 66.79.   Acute delirium:  - Supportive care, counseling to family. Delirium precautions  Paroxysmal atrial fibrillation: NSR on admission ECG, HR remains regular.  - Continue atenolol - Continue eliquis  HTN:  - Continued on losartan, atenolol.   GERD:  - PPI  ABLA on chronic iron deficiency anemia: Normocytic indices. Her baseline hgb appears to run 8-10 this year. Received TXA.  Hgb settling at  9.1g/dl, no ongoing bleeding.  - Restarted iron with bowel regimen.  Thrombocytopenia: Resolved spontaneously.  Stage IIIb CKD with NAGMA:  - SCr near her chronic baseline. Aim to avoid nephrotoxins.   History of vitamin B 12 deficiency: Last value was 261.  - Continue supplementation  Hyperglycemia: No diabetes, HbA1c 5.4%.   Tyrone Nine, MD [facility] [email or web address] 03/19/2023, 10:49 AM

## 2023-03-19 NOTE — Evaluation (Signed)
Occupational Therapy Evaluation Patient Details Name: Christine Middleton MRN: [id number] DOB: [birth year] Today's Date: 03/19/2023   History of Present Illness The pt is a 89 yo female presenting 8/15 after a fall at home resulting in a R hip fx s/p IM nail 8/17. PMH includes: diverticulitis, GERD, HTN, HLD, CVA, and afib on Eliquis.   Clinical Impression   Pt typically walks with a cane outside and is mod I in self care and light meal prep and housekeeping. Presents with impaired cognition (improved from post surgery), R hip pain, generalized weakness and decreased standing balance. She requires moderate assistance for bed mobility and transfers to [facility] and to recliner. She requires min to max assist for ADLs. Patient will benefit from continued inpatient follow up therapy, <3 hours/day.       If plan is discharge home, recommend the following: A lot of help with walking and/or transfers;A lot of help with bathing/dressing/bathroom;Assistance with cooking/housework;Assistance with feeding;Direct supervision/assist for medications management;Direct supervision/assist for financial management;Assist for transportation;Help with stairs or ramp for entrance    Functional Status Assessment  Patient has had a recent decline in their functional status and demonstrates the ability to make significant improvements in function in a reasonable and predictable amount of time.  Equipment Recommendations  Other (comment) (defer to next venue)    Recommendations for Other Services       Precautions / Restrictions Precautions Precautions: Fall Precaution Comments: pt admitted after a fall Restrictions Weight Bearing Restrictions: Yes RLE Weight Bearing: Weight bearing as tolerated      Mobility Bed Mobility Overal bed mobility: Needs Assistance Bed Mobility: Supine to Sit     Supine to sit: Mod assist     General bed mobility comments: HOB up, cues to use rail, assist for R LE and for hips to EOB  with bed pad    Transfers Overall transfer level: Needs assistance Equipment used: Rolling walker (2 wheels), 1 person hand held assist Transfers: Sit to/from Stand, Bed to chair/wheelchair/BSC Sit to Stand: Mod assist     Step pivot transfers: Mod assist     General transfer comment: x 2, assist to rise and steady      Balance Overall balance assessment: Needs assistance   Sitting balance-Leahy Scale: Fair     Standing balance support: Bilateral upper extremity supported, During functional activity Standing balance-Leahy Scale: Poor Standing balance comment: dependent on BUE support and at least min assist                           ADL either performed or assessed with clinical judgement   ADL Overall ADL's : Needs assistance/impaired Eating/Feeding: Set up;Sitting   Grooming: Wash/dry face;Wash/dry hands;Sitting;Set up   Upper Body Bathing: Minimal assistance;Sitting   Lower Body Bathing: Maximal assistance;Sit to/from stand   Upper Body Dressing : Minimal assistance;Sitting   Lower Body Dressing: Maximal assistance;Sit to/from stand   Toilet Transfer: Moderate assistance;Stand-pivot;BSC/3in1   Toileting- Clothing Manipulation and Hygiene: Total assistance;Sit to/from stand               Vision Baseline Vision/History: 1 Wears glasses Ability to See in Adequate Light: 0 Adequate Patient Visual Report: No change from baseline       Perception         Praxis         Pertinent Vitals/Pain Pain Assessment Pain Assessment: Faces Faces Pain Scale: Hurts even more Pain Location: R hip with movement Pain  Descriptors / Indicators: Discomfort, Aching Pain Intervention(s): Monitored during session, Repositioned, Ice applied     Extremity/Trunk Assessment Upper Extremity Assessment Upper Extremity Assessment: Generalized weakness   Lower Extremity Assessment Lower Extremity Assessment: Defer to PT evaluation   Cervical / Trunk  Assessment Cervical / Trunk Assessment: Kyphotic   Communication Communication Communication: Hearing impairment Cueing Techniques: Verbal cues;Tactile cues   Cognition Arousal: Alert Behavior During Therapy: WFL for tasks assessed/performed Overall Cognitive Status: Impaired/Different from baseline Area of Impairment: Memory, Problem solving                     Memory: Decreased short-term memory       Problem Solving: Slow processing, Decreased initiation General Comments: per son, pt is much better, but not at baseline     General Comments       Exercises     Shoulder Instructions      Home Living Family/patient expects to be discharged to:: Private residence Living Arrangements: Children Available Help at Discharge: Family;Available PRN/intermittently Type of Home: House Home Access: Ramped entrance     Home Layout: Multi-level;Able to live on main level with bedroom/bathroom     Bathroom Shower/Tub: Chief Strategy Officer: Standard     Home Equipment: Agricultural consultant (2 wheels);Rollator (4 wheels);BSC/3in1;Cane - single point;Tub bench   Additional Comments: pt lives with son who works part time but has other children that come over daily      Prior Functioning/Environment Prior Level of Function : Independent/Modified Independent             Mobility Comments: uses SPC when going outside, doesn't drive ADLs Comments: indep, pt reports she helps with some cooking and household chores. family assists with medicines (pill box)        OT Problem List: Decreased strength;Impaired balance (sitting and/or standing);Pain;Decreased cognition;Decreased knowledge of use of DME or AE      OT Treatment/Interventions: Self-care/ADL training;DME and/or AE instruction;Balance training;Patient/family education;Cognitive remediation/compensation    OT Goals(Current goals can be found in the care plan section) Acute Rehab OT Goals OT Goal  Formulation: With patient/family Time For Goal Achievement: 04/02/23 Potential to Achieve Goals: Good  OT Frequency: Min 1X/week    Co-evaluation              AM-PAC OT "6 Clicks" Daily Activity     Outcome Measure Help from another person eating meals?: A Little Help from another person taking care of personal grooming?: A Little Help from another person toileting, which includes using toliet, bedpan, or urinal?: Total Help from another person bathing (including washing, rinsing, drying)?: A Lot Help from another person to put on and taking off regular upper body clothing?: A Little Help from another person to put on and taking off regular lower body clothing?: A Lot 6 Click Score: 14   End of Session Equipment Utilized During Treatment: Gait belt;Rolling walker (2 wheels) Nurse Communication: Mobility status  Activity Tolerance: Patient tolerated treatment well Patient left: in chair;with call bell/phone within reach;with chair alarm set;with family/visitor present  OT Visit Diagnosis: Unsteadiness on feet (R26.81);Other abnormalities of gait and mobility (R26.89);Pain;Muscle weakness (generalized) (M62.81);Other symptoms and signs involving cognitive function                Time: 2952-8413 OT Time Calculation (min): 33 min Charges:  OT General Charges $OT Visit: 1 Visit OT Evaluation $OT Eval Moderate Complexity: 1 Mod OT Treatments $Self Care/Home Management : 8-22 mins  Raynelle Fanning  M, OTR/L Acute Rehabilitation Services Office: [phone]  Evern Bio 03/19/2023, 10:00 AM

## 2023-03-19 NOTE — Plan of Care (Signed)

## 2023-03-19 NOTE — Progress Notes (Signed)
RE:  Christine Middleton       Date of Birth: 04/13/1923      Date:  03/19/23        To Whom It May Concern:  Please be advised that the above-named patient will require a short-term nursing home stay - anticipated 30 days or less for rehabilitation and strengthening.  The plan is for return home.                 MD signature                Date

## 2023-03-19 NOTE — [place] FL2 (Signed)
[place] MEDICAID FL2 LEVEL OF CARE FORM     IDENTIFICATION  Patient Name: Christine Middleton Birthdate: [birth year] Sex: female Admission Date (Current Location): 03/15/2023  [facility] and [place] Number:  Producer, television/film/video and Address:  [facility], [address], [place], [place] [address]      Provider Number: [id number]  Attending Physician Name and Address:  Tyrone Nine, MD  Relative Name and Phone Number:  Clark,[name] Daughter   [phone]    Current Level of Care: Hospital Recommended Level of Care: Skilled Nursing Facility Prior Approval Number:    Date Approved/Denied:   PASRR Number:    Discharge Plan: SNF    Current Diagnoses: Patient Active Problem List   Diagnosis Date Noted   Closed intertrochanteric fracture of right hip, initial encounter (HCC) 03/16/2023   Closed intertrochanteric fracture of hip, right, initial encounter (HCC) 03/16/2023   Internal hemorrhoids 12/21/2022   Long term current use of anticoagulant therapy 12/20/2022   Vitamin D deficiency 11/09/2021   Paroxysmal atrial fibrillation (HCC) 11/09/2021   Iron deficiency anemia 10/28/2020   Symptomatic anemia 10/22/2020   Shingles 10/22/2020   Closed compression fracture of body of L1 vertebra (HCC) 09/04/2020   Fall at home, initial encounter 09/04/2020   Acute post-hemorrhagic anemia 09/04/2020   Rectal bleeding 07/08/2019   Depression with anxiety 09/26/2017   Pedal edema 10/18/2016   TMJ (dislocation of temporomandibular joint) 09/18/2016   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   Cerebrovascular accident (CVA) (HCC)    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/19/2016   Facial droop 08/18/2016   HYPERGLYCEMIA 06/30/2010   Cerebral artery occlusion with cerebral infarction (HCC) 07/19/2009   Renal insufficiency 07/19/2009   Polymyalgia rheumatica (HCC) 07/19/2009   B12 deficiency 06/30/2009   Hereditary and idiopathic peripheral neuropathy 06/28/2009   External hemorrhoids  12/03/2008   ESOPHAGEAL STRICTURE 11/30/2008   Diverticulosis of colon 11/30/2008   GROSS HEMATURIA 11/04/2008   MYALGIA 11/04/2008   Constipation 06/24/2008   HLD (hyperlipidemia) 05/30/2007   SKIN LESION 05/30/2007   Essential hypertension 03/19/2007   GERD 03/19/2007    Orientation RESPIRATION BLADDER Height & Weight     Self, Place  Normal Incontinent Weight: 136 lb 0.4 oz (61.7 kg) Height:  5\' 2"  (157.5 cm)  BEHAVIORAL SYMPTOMS/MOOD NEUROLOGICAL BOWEL NUTRITION STATUS      Continent Diet (see discharge summary)  AMBULATORY STATUS COMMUNICATION OF NEEDS Skin   Limited Assist Verbally Surgical wounds                       Personal Care Assistance Level of Assistance  Bathing, Feeding, Dressing Bathing Assistance: Maximum assistance Feeding assistance: Limited assistance Dressing Assistance: Maximum assistance     Functional Limitations Info  Sight, Hearing, Speech Sight Info: Adequate Hearing Info: Impaired Speech Info: Adequate    SPECIAL CARE FACTORS FREQUENCY  PT (By licensed PT), OT (By licensed OT)     PT Frequency: 5x week OT Frequency: 5x week            Contractures Contractures Info: Not present    Additional Factors Info  Code Status, Allergies Code Status Info: full Allergies Info: Codeine, Lipitor (Atorvastatin), Macrobid (Nitrofurantoin), Zestril (Lisinopril), Amoxil (Amoxicillin), Ultram (Tramadol)           Current Medications (03/19/2023):  This is the current hospital active medication list Current Facility-Administered Medications  Medication Dose Route Frequency Provider Last Rate Last Admin   acetaminophen (TYLENOL) tablet 650 mg  650  mg Oral Q8H London Sheer, MD   650 mg at 03/18/23 2159   apixaban (ELIQUIS) tablet 2.5 mg  2.5 mg Oral BID London Sheer, MD   2.5 mg at 03/19/23 0850   atenolol (TENORMIN) tablet 50 mg  50 mg Oral Daily London Sheer, MD   50 mg at 03/19/23 0850   bisacodyl (DULCOLAX) EC tablet 5 mg  5  mg Oral Daily PRN London Sheer, MD       feeding supplement (ENSURE ENLIVE / ENSURE PLUS) liquid 237 mL  237 mL Oral BID BM London Sheer, MD   237 mL at 03/19/23 0851   ferrous sulfate tablet 325 mg  325 mg Oral Q breakfast London Sheer, MD   325 mg at 03/19/23 0850   losartan (COZAAR) tablet 25 mg  25 mg Oral QPM London Sheer, MD   25 mg at 03/18/23 1803   magnesium citrate solution 1 Bottle  1 Bottle Oral Once PRN London Sheer, MD       mirtazapine (REMERON) tablet 7.5 mg  7.5 mg Oral QHS London Sheer, MD   7.5 mg at 03/18/23 2158   multivitamin with minerals tablet 1 tablet  1 tablet Oral Daily Tyrone Nine, MD   1 tablet at 03/19/23 8657   oxyCODONE (Oxy IR/ROXICODONE) immediate release tablet 2.5-5 mg  2.5-5 mg Oral Q4H PRN London Sheer, MD   5 mg at 03/18/23 1354   pantoprazole (PROTONIX) EC tablet 40 mg  40 mg Oral Daily London Sheer, MD   40 mg at 03/19/23 0850   [START ON 03/26/2023] polyethylene glycol (MIRALAX / GLYCOLAX) packet 17 g  17 g Oral Daily London Sheer, MD       senna-docusate (Senokot-S) tablet 1 tablet  1 tablet Oral BID Tyrone Nine, MD       tranexamic acid (CYKLOKAPRON) IVPB 1,000 mg  1,000 mg Intravenous Once London Sheer, MD       vitamin B-12 (CYANOCOBALAMIN) tablet 100 mcg  100 mcg Oral Daily Tyrone Nine, MD   100 mcg at 03/19/23 8469     Discharge Medications: Please see discharge summary for a list of discharge medications.  Relevant Imaging Results:  Relevant Lab Results:   Additional Information SSN: [id number]  Lorri Frederick, LCSW

## 2023-03-20 DIAGNOSIS — R319 Hematuria, unspecified: Secondary | ICD-10-CM | POA: Diagnosis not present

## 2023-03-20 DIAGNOSIS — K649 Unspecified hemorrhoids: Secondary | ICD-10-CM | POA: Diagnosis not present

## 2023-03-20 DIAGNOSIS — R531 Weakness: Secondary | ICD-10-CM | POA: Diagnosis not present

## 2023-03-20 DIAGNOSIS — I10 Essential (primary) hypertension: Secondary | ICD-10-CM | POA: Diagnosis not present

## 2023-03-20 DIAGNOSIS — D649 Anemia, unspecified: Secondary | ICD-10-CM | POA: Diagnosis not present

## 2023-03-20 DIAGNOSIS — N390 Urinary tract infection, site not specified: Secondary | ICD-10-CM | POA: Diagnosis not present

## 2023-03-20 DIAGNOSIS — S72141A Displaced intertrochanteric fracture of right femur, initial encounter for closed fracture: Secondary | ICD-10-CM | POA: Diagnosis not present

## 2023-03-20 DIAGNOSIS — R300 Dysuria: Secondary | ICD-10-CM | POA: Diagnosis not present

## 2023-03-20 DIAGNOSIS — K219 Gastro-esophageal reflux disease without esophagitis: Secondary | ICD-10-CM | POA: Diagnosis not present

## 2023-03-20 DIAGNOSIS — R2689 Other abnormalities of gait and mobility: Secondary | ICD-10-CM | POA: Diagnosis not present

## 2023-03-20 DIAGNOSIS — Z79899 Other long term (current) drug therapy: Secondary | ICD-10-CM | POA: Diagnosis not present

## 2023-03-20 DIAGNOSIS — I480 Paroxysmal atrial fibrillation: Secondary | ICD-10-CM | POA: Diagnosis not present

## 2023-03-20 DIAGNOSIS — K625 Hemorrhage of anus and rectum: Secondary | ICD-10-CM | POA: Diagnosis not present

## 2023-03-20 DIAGNOSIS — Z7401 Bed confinement status: Secondary | ICD-10-CM | POA: Diagnosis not present

## 2023-03-20 DIAGNOSIS — R58 Hemorrhage, not elsewhere classified: Secondary | ICD-10-CM | POA: Diagnosis not present

## 2023-03-20 DIAGNOSIS — S72144D Nondisplaced intertrochanteric fracture of right femur, subsequent encounter for closed fracture with routine healing: Secondary | ICD-10-CM | POA: Diagnosis not present

## 2023-03-20 DIAGNOSIS — S79929A Unspecified injury of unspecified thigh, initial encounter: Secondary | ICD-10-CM | POA: Diagnosis not present

## 2023-03-20 DIAGNOSIS — S728X1A Other fracture of right femur, initial encounter for closed fracture: Secondary | ICD-10-CM | POA: Diagnosis not present

## 2023-03-20 DIAGNOSIS — M6281 Muscle weakness (generalized): Secondary | ICD-10-CM | POA: Diagnosis not present

## 2023-03-20 DIAGNOSIS — Z7901 Long term (current) use of anticoagulants: Secondary | ICD-10-CM | POA: Diagnosis not present

## 2023-03-20 DIAGNOSIS — E119 Type 2 diabetes mellitus without complications: Secondary | ICD-10-CM | POA: Diagnosis not present

## 2023-03-20 DIAGNOSIS — S72141D Displaced intertrochanteric fracture of right femur, subsequent encounter for closed fracture with routine healing: Secondary | ICD-10-CM | POA: Diagnosis not present

## 2023-03-20 DIAGNOSIS — Z9181 History of falling: Secondary | ICD-10-CM | POA: Diagnosis not present

## 2023-03-20 DIAGNOSIS — K922 Gastrointestinal hemorrhage, unspecified: Secondary | ICD-10-CM | POA: Diagnosis not present

## 2023-03-20 DIAGNOSIS — R278 Other lack of coordination: Secondary | ICD-10-CM | POA: Diagnosis not present

## 2023-03-20 DIAGNOSIS — R7989 Other specified abnormal findings of blood chemistry: Secondary | ICD-10-CM | POA: Diagnosis not present

## 2023-03-20 LAB — GLUCOSE, CAPILLARY
Glucose-Capillary: 106 mg/dL — ABNORMAL HIGH (ref 70–99)
Glucose-Capillary: 119 mg/dL — ABNORMAL HIGH (ref 70–99)
Glucose-Capillary: 130 mg/dL — ABNORMAL HIGH (ref 70–99)

## 2023-03-20 MED ORDER — CYANOCOBALAMIN 100 MCG PO TABS: 100.0000 ug | ORAL_TABLET | Freq: Every day | ORAL | Status: DC

## 2023-03-20 MED ORDER — ENSURE ENLIVE PO LIQD: 237.0000 mL | Freq: Two times a day (BID) | ORAL | Status: DC

## 2023-03-20 NOTE — Progress Notes (Signed)
Physical Therapy Treatment Patient Details Name: Christine Middleton MRN: [id number] DOB: [birth year] Today's Date: 03/20/2023   History of Present Illness The pt is a 89 yo female presenting 8/15 after a fall at home resulting in a R hip fx s/p IM nail 8/17. PMH includes: diverticulitis, GERD, HTN, HLD, CVA, and afib on Eliquis.    PT Comments  Pt supine in bed saturated in urine and whimpers when L LE is gently moved to remove blankets. Pt slightly disoriented and unaware that [place] no longer in place. PT provided min-modA for rolling for pericare and mod A for coming to seated EoB. Daughter in room as PT changing gown. Pt obviously happy to see daughter and less painful with movement. Pt able to stand with min A and ambulate in room until L LE becomes more painful with weightbearing. Pt to discharge to SNF this morning.     If plan is discharge home, recommend the following: A lot of help with walking and/or transfers;A lot of help with bathing/dressing/bathroom;Assistance with cooking/housework;Direct supervision/assist for medications management;Direct supervision/assist for financial management;Assist for transportation;Help with stairs or ramp for entrance   Can travel by private vehicle     No  Equipment Recommendations  Rolling walker (2 wheels)    Recommendations for Other Services OT consult     Precautions / Restrictions Precautions Precautions: Fall Precaution Comments: pt admitted after a fall Restrictions Weight Bearing Restrictions: Yes RLE Weight Bearing: Weight bearing as tolerated     Mobility  Bed Mobility Overal bed mobility: Needs Assistance Bed Mobility: Rolling, Sidelying to Sit Rolling: Mod assist Sidelying to sit: Mod assist, HOB elevated, Used rails       General bed mobility comments: pt found to be incontinent of urine and is saturated, requires modA for rolling for pericare, with HoB elevated and modA for managment of LE off bed pt able to push up into  seated    Transfers Overall transfer level: Needs assistance Equipment used: Rolling walker (2 wheels) Transfers: Sit to/from Stand, Bed to chair/wheelchair/BSC Sit to Stand: Min assist           General transfer comment: vc for hand placement and scooting hips to EoB, pt able to power up without assist and only min A for steadying at RW    Ambulation/Gait Ambulation/Gait assistance: Contact guard assist Gait Distance (Feet): 8 Feet Assistive device: Rolling walker (2 wheels) Gait Pattern/deviations: Step-to pattern, Decreased stance time - right, Decreased stride length, Decreased weight shift to right, Shuffle Gait velocity: decreased Gait velocity interpretation: <1.31 ft/sec, indicative of household ambulator   General Gait Details: vc for upright posture and looking up and out, able to ambulate towards window and turn around and come back to recliner, gait more antalgic with distance.         Balance Overall balance assessment: Needs assistance Sitting-balance support: Single extremity supported, Feet supported Sitting balance-Leahy Scale: Fair     Standing balance support: Bilateral upper extremity supported, During functional activity Standing balance-Leahy Scale: Poor Standing balance comment: dependent on BUE support                            Cognition Arousal: Alert Behavior During Therapy: WFL for tasks assessed/performed Overall Cognitive Status: History of cognitive impairments - at baseline  General Comments: decrease memory of hospitalization, initial states she is not allowed to move due to her broken hip           General Comments General comments (skin integrity, edema, etc.): VSS on RA, daughter present as pt being cleaned up      Pertinent Vitals/Pain Pain Assessment Pain Assessment: Faces Faces Pain Scale: Hurts whole lot (2 after up and walking) Pain Location: R LE movement in  bed, wince and crying with initial movement but once up and moving only mild pain with weightbearing Pain Descriptors / Indicators: Aching, Crying, Moaning, Sore, Grimacing, Guarding Pain Intervention(s): Limited activity within patient's tolerance, Monitored during session, Repositioned     PT Goals (current goals can now be found in the care plan section) Acute Rehab PT Goals Patient Stated Goal: return home PT Goal Formulation: With patient Time For Goal Achievement: 04/01/23 Potential to Achieve Goals: Good Progress towards PT goals: Progressing toward goals    Frequency    Min 1X/week       AM-PAC PT "6 Clicks" Mobility   Outcome Measure  Help needed turning from your back to your side while in a flat bed without using bedrails?: A Little Help needed moving from lying on your back to sitting on the side of a flat bed without using bedrails?: A Lot Help needed moving to and from a bed to a chair (including a wheelchair)?: A Lot Help needed standing up from a chair using your arms (e.g., wheelchair or bedside chair)?: A Lot Help needed to walk in hospital room?: A Lot Help needed climbing 3-5 steps with a railing? : A Lot 6 Click Score: 13    End of Session Equipment Utilized During Treatment: Gait belt Activity Tolerance: Patient tolerated treatment well Patient left: in chair;with call bell/phone within reach;with chair alarm set;with family/visitor present Nurse Communication: Mobility status PT Visit Diagnosis: Other abnormalities of gait and mobility (R26.89);Muscle weakness (generalized) (M62.81);Pain Pain - Right/Left: Right Pain - part of body: Hip     Time: 8295-6213 PT Time Calculation (min) (ACUTE ONLY): 36 min  Charges:    $Gait Training: 8-22 mins $Therapeutic Activity: 8-22 mins PT General Charges $$ ACUTE PT VISIT: 1 Visit                     Bricia Taher B. Beverely Risen PT, DPT Acute Rehabilitation Services Please use secure chat or  Call Office  [phone]    Elon Alas [facility] 03/20/2023, 11:29 AM

## 2023-03-20 NOTE — Plan of Care (Signed)
Handoff given to [facility]. No further questions. Patient IV removed, family at bedside. Will continue to monitor.   Problem: Education: Goal: Knowledge of General Education information will improve Description: Including pain rating scale, medication(s)/side effects and non-pharmacologic comfort measures 03/20/2023 1029 by Evern Core, RN Outcome: Adequate for Discharge 03/20/2023 0750 by Evern Core, RN Outcome: Progressing   Problem: Health Behavior/Discharge Planning: Goal: Ability to manage health-related needs will improve Outcome: Adequate for Discharge   Problem: Clinical Measurements: Goal: Ability to maintain clinical measurements within normal limits will improve Outcome: Adequate for Discharge Goal: Will remain free from infection Outcome: Adequate for Discharge Goal: Diagnostic test results will improve Outcome: Adequate for Discharge Goal: Respiratory complications will improve Outcome: Adequate for Discharge Goal: Cardiovascular complication will be avoided Outcome: Adequate for Discharge   Problem: Activity: Goal: Risk for activity intolerance will decrease 03/20/2023 1029 by Evern Core, RN Outcome: Adequate for Discharge 03/20/2023 0750 by Evern Core, RN Outcome: Progressing   Problem: Nutrition: Goal: Adequate nutrition will be maintained Outcome: Adequate for Discharge   Problem: Coping: Goal: Level of anxiety will decrease Outcome: Adequate for Discharge   Problem: Elimination: Goal: Will not experience complications related to bowel motility Outcome: Adequate for Discharge Goal: Will not experience complications related to urinary retention Outcome: Adequate for Discharge   Problem: Pain Managment: Goal: General experience of comfort will improve 03/20/2023 1029 by Evern Core, RN Outcome: Adequate for Discharge 03/20/2023 0750 by Evern Core, RN Outcome: Progressing   Problem: Safety: Goal: Ability to remain  free from injury will improve 03/20/2023 1029 by Evern Core, RN Outcome: Adequate for Discharge 03/20/2023 0750 by Evern Core, RN Outcome: Progressing   Problem: Skin Integrity: Goal: Risk for impaired skin integrity will decrease 03/20/2023 1029 by Evern Core, RN Outcome: Adequate for Discharge 03/20/2023 0750 by Evern Core, RN Outcome: Progressing   Problem: Increased Nutrient Needs (NI-5.1) Goal: Food and/or nutrient delivery Description: Individualized approach for food/nutrient provision. Outcome: Adequate for Discharge   Problem: Acute Rehab PT Goals(only PT should resolve) Goal: Pt Will Go Supine/Side To Sit Outcome: Adequate for Discharge Goal: Patient Will Perform Sitting Balance Outcome: Adequate for Discharge Goal: Patient Will Transfer Sit To/From Stand Outcome: Adequate for Discharge Goal: Pt Will Transfer Bed To Chair/Chair To Bed Outcome: Adequate for Discharge Goal: Pt Will Ambulate Outcome: Adequate for Discharge Goal: Pt Will Go Up/Down Stairs Outcome: Adequate for Discharge

## 2023-03-20 NOTE — TOC Transition Note (Signed)
Transition of Care [facility]) - CM/SW Discharge Note   Patient Details  Name: Christine Middleton MRN: [id number] Date of Birth: Jun 29, 1923  [facility] CM/SW Contact:  Lorri Frederick, LCSW Phone Number: 03/20/2023, 10:15 AM   Clinical Narrative:  Pt discharging to [facility].  RN call report to  [phone].     Final next level of care: Skilled Nursing Facility Barriers to Discharge: Barriers Resolved   Patient Goals and CMS Choice CMS Medicare.gov Compare Post Acute Care list provided to:: Patient Choice offered to / list presented to : Patient, Adult Children (son Gerlene Burdock)  Discharge Placement                Patient chooses bed at: [facility] Patient to be transferred to facility by: [facility] Name of family member notified: daughter Eber Jones in room Patient and family notified of of transfer: 03/20/23  Discharge Plan and Services Additional resources added to the After Visit Summary for   In-house Referral: Clinical Social Work   Post Acute Care Choice: Skilled Nursing Facility                               Social Determinants of Health (SDOH) Interventions SDOH Screenings   Food Insecurity: No Food Insecurity (12/26/2022)  Housing: Low Risk  (06/29/2022)  Transportation Needs: No Transportation Needs (12/26/2022)  Utilities: Not At Risk (06/29/2022)  Alcohol Screen: Low Risk  (02/14/2021)  Depression (PHQ2-9): Low Risk  (01/05/2023)  Financial Resource Strain: Low Risk  (02/16/2022)  Physical Activity: Inactive (02/16/2022)  Social Connections: Moderately Integrated (02/16/2022)  Recent Concern: Social Connections - Moderately Isolated (02/06/2022)  Stress: No Stress Concern Present (02/16/2022)  Tobacco Use: Low Risk  (03/17/2023)     Readmission Risk Interventions     No data to display

## 2023-03-20 NOTE — Plan of Care (Signed)

## 2023-03-20 NOTE — Discharge Summary (Signed)
Physician Discharge Summary   Patient: Christine Middleton MRN: [id number] DOB: Feb 24, 1923  Admit date:     03/15/2023  Discharge date: 03/20/23  Discharge Physician: Tyrone Nine   PCP: Nelwyn Salisbury, MD   Recommendations at discharge:  Follow up with PCP in 1-2 weeks. Monitor BMP, CBC in the next week at SNF.  If confusion persists, consider formal cognitive testing, though current suspicion is acute hospital/postoperative delirium.  Follow up with orthopedics in 2 weeks, Dr. Christell Constant.   Discharge Diagnoses: Principal Problem:   Closed intertrochanteric fracture of hip, right, initial encounter [facility]) Active Problems:   Renal insufficiency   Closed intertrochanteric fracture of right hip, initial encounter [facility])  Hospital Course: Christine Middleton is a 89 y.o. female with a history of PAF on eliquis, HTN, HLD, CVA, B12 deficiency, and GERD who presented to the ED on 03/15/2023 after tripping while trimming hedges. She fell on her right hip and had pain, inability to walk. Right intertrochanteric right hip fracture was confirmed by XR. She was admitted and had intramedullary nail placed by Dr. Christell Constant 8/17. Postoperative course complicated by acute delirium and anemia not requiring transfusion. We plan to discharge to SNF for rehabilitation. Please see details below.  Assessment and Plan: Fall at home with closed right intertrochanteric femur fracture: now s/p IM nail 8/17 by Dr. Christell Constant.  - Continue PT/OT > planning SNF rehabilitation - VTE ppx with her home eliquis.  - WBAT - Pain control, multimodal, minimize narcotics, continue bowel regimen - Vitamin D level a few months ago was 66.79.    Acute delirium:  - Supportive care, counseling to family. Delirium precautions. No focal deficits   Paroxysmal atrial fibrillation: NSR on admission ECG, HR remains regular.  - Continue atenolol - Continue eliquis   HTN:  - Continued on losartan, atenolol.    GERD:  - PPI   ABLA on chronic iron  deficiency anemia: Normocytic indices. Her baseline hgb appears to run 8-10 this year. Received TXA.  Hgb settled at 9.1g/dl, no ongoing bleeding.  - Ok to continue DOAC. - Restarted iron with bowel regimen.   Thrombocytopenia: Resolved spontaneously.   Stage IIIb CKD with NAGMA:  - SCr near her chronic baseline. Aim to avoid nephrotoxins.    History of vitamin B 12 deficiency: Last value was 261.  - Continue supplementation   Hyperglycemia: No diabetes, HbA1c 5.4%.   Consultants: Orthopedics Procedures performed:  03/17/23 INTRAMEDULLARY (IM) NAIL INTERTROCHANTERIC London Sheer, MD  Disposition: Skilled nursing facility Diet recommendation: Regular DISCHARGE MEDICATION: Allergies as of 03/20/2023       Reactions   Codeine Other (See Comments)   Unknown reaction   Lipitor [atorvastatin] Other (See Comments)   Myalgias  Joint stiffness   Macrobid [nitrofurantoin] Other (See Comments)   Unknown reaction   Zestril [lisinopril] Other (See Comments)   Unknown reaction   Amoxil [amoxicillin] Rash   Ultram [tramadol] Anxiety, Other (See Comments)   Sedation Worsening depression        Medication List     STOP taking these medications    acetaminophen 650 MG CR tablet Commonly known as: TYLENOL Replaced by: acetaminophen 325 MG tablet       TAKE these medications    acetaminophen 325 MG tablet Commonly known as: TYLENOL Take 2 tablets (650 mg total) by mouth every 8 (eight) hours for 21 days. Replaces: acetaminophen 650 MG CR tablet   apixaban 2.5 MG Tabs tablet Commonly known as: Eliquis Take 1  tablet (2.5 mg total) by mouth 2 (two) times daily.   atenolol 50 MG tablet Commonly known as: TENORMIN TAKE 1 TABLET BY MOUTH EVERY DAY   cyanocobalamin 100 MCG tablet Take 1 tablet (100 mcg total) by mouth daily.   docusate sodium 100 MG capsule Commonly known as: COLACE Take 2 capsules (200 mg total) by mouth 2 (two) times daily. What changed:  how much  to take when to take this   feeding supplement Liqd Take 237 mLs by mouth 2 (two) times daily between meals.   Iron (Ferrous Sulfate) 325 (65 Fe) MG Tabs Take 325 mg by mouth daily. What changed: when to take this   losartan 25 MG tablet Commonly known as: COZAAR Take 1 tablet (25 mg total) by mouth every evening.   mirtazapine 15 MG tablet Commonly known as: REMERON Take 1/2 (7.5 mg) tablet by mouth at bedtime   oxyCODONE 5 MG immediate release tablet Commonly known as: Oxy IR/ROXICODONE Take 0.5-1 tablets (2.5-5 mg total) by mouth every 4 (four) hours as needed for up to 7 days for moderate pain or severe pain.   pantoprazole 40 MG tablet Commonly known as: PROTONIX TAKE 1 TABLET BY MOUTH EVERY DAY What changed: when to take this   polyethylene glycol 17 g packet Commonly known as: MIRALAX / GLYCOLAX Take 17 g by mouth daily for 14 days. Start taking on: March 26, 2023 What changed: These instructions start on March 26, 2023. If you are unsure what to do until then, ask your doctor or other care provider.   senna-docusate 8.6-50 MG tablet Commonly known as: Senokot-S Take 1 tablet by mouth 2 (two) times daily for 14 days.   VITAMIN D-3 PO Take 1 capsule by mouth every evening.        Follow-up Information     Nelwyn Salisbury, MD Follow up.   Specialty: Family Medicine Contact information: [address] [place] [address] [phone]                Discharge Exam: Filed Weights   03/15/23 2232 03/15/23 2240 03/17/23 0642  Weight: 61.7 kg 61.7 kg 61.7 kg  BP (!) 133/59 (BP Location: Right Arm)   Pulse 78   Temp 97.9 F (36.6 C)   Resp 17   Ht 5\' 2"  (1.575 m)   Wt 61.7 kg   SpO2 93%   BMI 24.88 kg/m   No distress, pleasantly interactive, incompletely oriented Clear, nonlabored No edema No active bleeding on thigh dressings, no surrounding ecchymoses. Appropriately mildly tender to palpation over surgical site  Condition at  discharge: stable  The results of significant diagnostics from this hospitalization (including imaging, microbiology, ancillary and laboratory) are listed below for reference.   Imaging Studies: DG FEMUR, MIN 2 VIEWS RIGHT  Result Date: 03/17/2023 CLINICAL DATA:  Status post right hip ORIF EXAM: RIGHT FEMUR 2 VIEWS COMPARISON:  None Available. FINDINGS: Right intertrochanteric fracture transfixed with a intramedullary nail and interlocking femoral neck screws in near anatomic alignment. Postsurgical changes in the surrounding soft tissues. Generalized osteopenia. No other acute fracture or dislocation. Peripheral vascular atherosclerotic disease. IMPRESSION: 1. Right intertrochanteric fracture transfixed with a intramedullary nail and interlocking femoral neck screws in near anatomic alignment. Electronically Signed   By: Elige Ko M.D.   On: 03/17/2023 13:06   DG HIP UNILAT WITH PELVIS 2-3 VIEWS RIGHT  Result Date: 03/17/2023 CLINICAL DATA:  Right hip ORIF EXAM: DG HIP (WITH OR WITHOUT PELVIS) 2-3V RIGHT  COMPARISON:  None Available. FINDINGS: Multiple intraoperative fluoroscopic spot images are provided. Right intertrochanteric fracture transfixed with a intramedullary nail and 2 interlocking cannulated femoral neck screws in satisfactory position. FLUOROSCOPY TIME:  Radiation Exposure Index: 12.195 mGy IMPRESSION: 1. Right intertrochanteric fracture transfixed with a intramedullary nail and 2 interlocking cannulated femoral neck screws in satisfactory position. Electronically Signed   By: Elige Ko M.D.   On: 03/17/2023 10:33   ECHOCARDIOGRAM COMPLETE  Result Date: 03/16/2023    ECHOCARDIOGRAM REPORT   Patient Name:   PHILOMENA BER [facility] Date of Exam: 03/16/2023 Medical Rec #:  [id number]     Height:       62.0 in Accession #:    [id number]    Weight:       136.0 lb Date of Birth:  [birth year]      BSA:          1.623 m Patient Age:    89 years      BP:           121/48 mmHg Patient Gender: F              HR:           72 bpm. Exam Location:  Inpatient Procedure: 2D Echo, Cardiac Doppler and Color Doppler Indications:    Preoperative evaluation  History:        Patient has prior history of Echocardiogram examinations, most                 recent 08/18/2016. Stroke, Arrythmias:Atrial Fibrillation; Risk                 Factors:Hypertension and Dyslipidemia.  Sonographer:    [name] Referring Phys: [id number] ALEXIS HUGELMEYER IMPRESSIONS  1. Left ventricular ejection fraction, by estimation, is 65 to 70%. The left ventricle has normal function. The left ventricle has no regional wall motion abnormalities. Left ventricular diastolic parameters are consistent with Grade II diastolic dysfunction (pseudonormalization). Elevated left atrial pressure.  2. Right ventricular systolic function is normal. The right ventricular size is normal.  3. Left atrial size was moderately dilated.  4. Mild mitral valve regurgitation.  5. The aortic valve is tricuspid. Aortic valve regurgitation is mild.  6. The inferior vena cava is normal in size with greater than 50% respiratory variability, suggesting right atrial pressure of 3 mmHg. FINDINGS  Left Ventricle: Left ventricular ejection fraction, by estimation, is 65 to 70%. The left ventricle has normal function. The left ventricle has no regional wall motion abnormalities. The left ventricular internal cavity size was normal in size. There is  no left ventricular hypertrophy. Left ventricular diastolic parameters are consistent with Grade II diastolic dysfunction (pseudonormalization). Elevated left atrial pressure. Right Ventricle: The right ventricular size is normal. Right vetricular wall thickness was not assessed. Right ventricular systolic function is normal. Left Atrium: Left atrial size was moderately dilated. Right Atrium: Right atrial size was normal in size. Pericardium: There is no evidence of pericardial effusion. Mitral Valve: There is mild thickening of the mitral  valve leaflet(s). Mild mitral annular calcification. Mild mitral valve regurgitation. Tricuspid Valve: The tricuspid valve is normal in structure. Tricuspid valve regurgitation is mild. Aortic Valve: The aortic valve is tricuspid. Aortic valve regurgitation is mild. Aortic regurgitation PHT measures 421 msec. Aortic valve mean gradient measures 7.0 mmHg. Aortic valve peak gradient measures 11.6 mmHg. Aortic valve area, by VTI measures 1.53 cm. Pulmonic Valve: The pulmonic valve was normal in structure. Pulmonic valve regurgitation is  mild. Aorta: The aortic root and ascending aorta are structurally normal, with no evidence of dilitation. Venous: The inferior vena cava is normal in size with greater than 50% respiratory variability, suggesting right atrial pressure of 3 mmHg. IAS/Shunts: No atrial level shunt detected by color flow Doppler.  LEFT VENTRICLE PLAX 2D LVIDd:         4.00 cm   Diastology LVIDs:         2.60 cm   LV e' medial:    5.22 cm/s LV PW:         1.00 cm   LV E/e' medial:  19.0 LV IVS:        1.00 cm   LV e' lateral:   6.64 cm/s LVOT diam:     1.80 cm   LV E/e' lateral: 14.9 LV SV:         57 LV SV Index:   35 LVOT Area:     2.54 cm  RIGHT VENTRICLE             IVC RV S prime:     13.40 cm/s  IVC diam: 1.20 cm TAPSE (M-mode): 1.7 cm LEFT ATRIUM           Index        RIGHT ATRIUM           Index LA diam:      3.40 cm 2.10 cm/m   RA Area:     15.40 cm LA Vol (A2C): 66.3 ml 40.86 ml/m  RA Volume:   31.30 ml  19.29 ml/m LA Vol (A4C): 67.8 ml 41.78 ml/m  AORTIC VALVE AV Area (Vmax):    1.60 cm AV Area (Vmean):   1.44 cm AV Area (VTI):     1.53 cm AV Vmax:           170.50 cm/s AV Vmean:          123.000 cm/s AV VTI:            0.371 m AV Peak Grad:      11.6 mmHg AV Mean Grad:      7.0 mmHg LVOT Vmax:         107.33 cm/s LVOT Vmean:        69.700 cm/s LVOT VTI:          0.224 m LVOT/AV VTI ratio: 0.60 AI PHT:            421 msec  AORTA Ao Root diam: 3.20 cm Ao Asc diam:  3.80 cm MITRAL VALVE                TRICUSPID VALVE MV Area (PHT): 3.60 cm    TR Peak grad:   26.6 mmHg MV Decel Time: 211 msec    TR Vmax:        258.00 cm/s MR Peak grad: 66.6 mmHg MR Vmax:      408.00 cm/s  SHUNTS MV E velocity: 99.00 cm/s  Systemic VTI:  0.22 m MV A velocity: 91.70 cm/s  Systemic Diam: 1.80 cm MV E/A ratio:  1.08 Dietrich Pates MD Electronically signed by Dietrich Pates MD Signature Date/Time: 03/16/2023/3:57:57 PM    Final    DG Knee 1-2 Views Right  Result Date: 03/16/2023 CLINICAL DATA:  Level 2 trauma, hip fracture. EXAM: RIGHT KNEE - 1-2 VIEW COMPARISON:  None. FINDINGS: No acute fracture or dislocation. Mild degenerative changes are present in the medial and patellofemoral compartments. No joint effusion. Vascular calcifications are noted  in the soft tissues. IMPRESSION: No acute fracture or dislocation. Electronically Signed   By: Thornell Sartorius M.D.   On: 03/16/2023 00:47   CT HEAD WO CONTRAST  Result Date: 03/15/2023 CLINICAL DATA:  Head trauma fall, hip fracture EXAM: CT HEAD WITHOUT CONTRAST CT CERVICAL SPINE WITHOUT CONTRAST TECHNIQUE: Multidetector CT imaging of the head and cervical spine was performed following the standard protocol without intravenous contrast. Multiplanar CT image reconstructions of the cervical spine were also generated. RADIATION DOSE REDUCTION: This exam was performed according to the departmental dose-optimization program which includes automated exposure control, adjustment of the mA and/or kV according to patient size and/or use of iterative reconstruction technique. COMPARISON:  CT brain 06/03/2022 FINDINGS: CT HEAD FINDINGS Brain: No acute territorial infarction or hemorrhage is visualized. Stable partially calcified 1.9 cm left convexity extra-axial mass most likely representing calcified hemangioma. Atrophy and chronic small vessel ischemic changes of the white matter. Small chronic infarct high left frontal vertex. 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 Stable  ventricle size Vascular: No hyperdense vessels.  Carotid vascular calcification Skull: Normal. Negative for fracture or focal lesion. Sinuses/Orbits: Mucosal thickening in the sinuses Other: None CT CERVICAL SPINE FINDINGS Alignment: No subluxation.  Facet alignment within normal limits. Skull base and vertebrae: No acute fracture. No primary bone lesion or focal pathologic process. Soft tissues and spinal canal: No prevertebral fluid or swelling. No visible canal hematoma. Disc levels: Moderate disc space narrowing and degenerative change C4 through C7. Facet degenerative changes at multiple levels with foraminal narrowing Upper chest: Negative. Other: None IMPRESSION: 1. No CT evidence for acute intracranial abnormality. Atrophy and chronic small vessel ischemic changes of the white matter. Chronic infarcts as above. 2. Stable 1.9 cm calcified left convexity meningioma. 3. Degenerative changes of the cervical spine. No acute osseous abnormality. Electronically Signed   By: Jasmine Pang M.D.   On: 03/15/2023 23:14   CT CERVICAL SPINE WO CONTRAST  Result Date: 03/15/2023 CLINICAL DATA:  Head trauma fall, hip fracture EXAM: CT HEAD WITHOUT CONTRAST CT CERVICAL SPINE WITHOUT CONTRAST TECHNIQUE: Multidetector CT imaging of the head and cervical spine was performed following the standard protocol without intravenous contrast. Multiplanar CT image reconstructions of the cervical spine were also generated. RADIATION DOSE REDUCTION: This exam was performed according to the departmental dose-optimization program which includes automated exposure control, adjustment of the mA and/or kV according to patient size and/or use of iterative reconstruction technique. COMPARISON:  CT brain 06/03/2022 FINDINGS: CT HEAD FINDINGS Brain: No acute territorial infarction or hemorrhage is visualized. Stable partially calcified 1.9 cm left convexity extra-axial mass most likely representing calcified hemangioma. Atrophy and chronic  small vessel ischemic changes of the white matter. Small chronic infarct high left frontal vertex. 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 Stable ventricle size Vascular: No hyperdense vessels.  Carotid vascular calcification Skull: Normal. Negative for fracture or focal lesion. Sinuses/Orbits: Mucosal thickening in the sinuses Other: None CT CERVICAL SPINE FINDINGS Alignment: No subluxation.  Facet alignment within normal limits. Skull base and vertebrae: No acute fracture. No primary bone lesion or focal pathologic process. Soft tissues and spinal canal: No prevertebral fluid or swelling. No visible canal hematoma. Disc levels: Moderate disc space narrowing and degenerative change C4 through C7. Facet degenerative changes at multiple levels with foraminal narrowing Upper chest: Negative. Other: None IMPRESSION: 1. No CT evidence for acute intracranial abnormality. Atrophy and chronic small vessel ischemic changes of the white matter. Chronic infarcts as above. 2. Stable 1.9 cm  calcified left convexity meningioma. 3. Degenerative changes of the cervical spine. No acute osseous abnormality. Electronically Signed   By: Jasmine Pang M.D.   On: 03/15/2023 23:14   DG Hip Unilat With Pelvis 2-3 Views Right  Result Date: 03/15/2023 CLINICAL DATA:  Fall with hip pain EXAM: DG HIP (WITH OR WITHOUT PELVIS) 2-3V RIGHT COMPARISON:  05/03/2022 FINDINGS: SI joints show mild degenerative change. Pubic symphysis and rami are intact. Acute comminuted and displaced right intertrochanteric fracture. No femoral head dislocation IMPRESSION: Acute comminuted and displaced right intertrochanteric fracture. Electronically Signed   By: Jasmine Pang M.D.   On: 03/15/2023 23:07   DG Chest Port 1 View  Result Date: 03/15/2023 CLINICAL DATA:  Fall with hip pain EXAM: PORTABLE CHEST 1 VIEW COMPARISON:  05/03/2022 FINDINGS: Rounded metallic density over the left neck. No acute airspace disease or pleural effusion. Mild  diffuse coarse chronic appearing interstitial opacity. Borderline cardiomegaly with aortic atherosclerosis. Apical pleural thickening. No pneumothorax IMPRESSION: No active disease. Chronic appearing interstitial changes. Round metallic density over left neck, correlate with direct inspection Electronically Signed   By: Jasmine Pang M.D.   On: 03/15/2023 23:06    Microbiology: Results for orders placed or performed during the hospital encounter of 03/15/23  MRSA Next Gen by PCR, Nasal     Status: None   Collection Time: 03/16/23  9:03 AM   Specimen: Nasal Mucosa; Nasal Swab  Result Value Ref Range Status   MRSA by PCR Next Gen NOT DETECTED NOT DETECTED Final    Comment: (NOTE) The GeneXpert MRSA Assay (FDA approved for NASAL specimens only), is one component of a comprehensive MRSA colonization surveillance program. It is not intended to diagnose MRSA infection nor to guide or monitor treatment for MRSA infections. Test performance is not FDA approved in patients less than 40 years old. Performed at [facility], [address] [place], [place] [address]     Labs: CBC: Recent Labs  Lab 03/15/23 2248 03/16/23 1424 03/17/23 0154 03/18/23 0420 03/19/23 0323  WBC 15.4* 10.4 10.9* 12.0* 9.5  NEUTROABS 13.8*  --   --   --   --   HGB 10.9* 9.7* 9.9* 8.8* 9.1*  HCT 34.2* 29.9* 32.2* 26.6* 28.1*  MCV 89.1 88.2 93.6 88.7 92.1  PLT 201 177 168 111* 173   Basic Metabolic Panel: Recent Labs  Lab 03/15/23 2248 03/16/23 1424 03/17/23 0154 03/18/23 0420 03/19/23 0323  NA 137 137 137 137 137  K 4.4 3.6 3.7 4.1 3.7  CL 100 102 102 107 107  CO2 21* 25 24 19* 21*  GLUCOSE 207* 129* 132* 114* 107*  BUN 22 19 19 14 13   CREATININE 1.22* 1.15* 0.97 1.03* 0.76  CALCIUM 9.2 8.5* 8.9 8.0* 8.5*   Liver Function Tests: No results for input(s): "AST", "ALT", "ALKPHOS", "BILITOT", "PROT", "ALBUMIN" in the last 168 hours. CBG: Recent Labs  Lab 03/19/23 0411 03/19/23 2003  03/20/23 0004 03/20/23 0350 03/20/23 0730  GLUCAP 101* 134* 130* 119* 106*    Discharge time spent: greater than 30 minutes.  Signed: Tyrone Nine, MD [facility] 03/20/2023

## 2023-03-20 NOTE — TOC Progression Note (Addendum)
Transition of Care [facility]) - Progression Note    Patient Details  Name: Christine Middleton MRN: [id number] Date of Birth: 1922/11/03  Transition of Care [facility]) CM/SW Contact  Lorri Frederick, LCSW Phone Number: 03/20/2023, 8:37 AM  Clinical Narrative:   Auth approved in [place]: [id number], 3 days: 8/19-8/21.  MD informed.  CSW confirmed with Michelle/Ashton that they can receive pt today.    Expected Discharge Plan: Skilled Nursing Facility Barriers to Discharge: SNF Pending bed offer  Expected Discharge Plan and Services In-house Referral: Clinical Social Work   Post Acute Care Choice: Skilled Nursing Facility Living arrangements for the past 2 months: Single Family Home Expected Discharge Date: 03/20/23                                     Social Determinants of Health (SDOH) Interventions SDOH Screenings   Food Insecurity: No Food Insecurity (12/26/2022)  Housing: Low Risk  (06/29/2022)  Transportation Needs: No Transportation Needs (12/26/2022)  Utilities: Not At Risk (06/29/2022)  Alcohol Screen: Low Risk  (02/14/2021)  Depression (PHQ2-9): Low Risk  (01/05/2023)  Financial Resource Strain: Low Risk  (02/16/2022)  Physical Activity: Inactive (02/16/2022)  Social Connections: Moderately Integrated (02/16/2022)  Recent Concern: Social Connections - Moderately Isolated (02/06/2022)  Stress: No Stress Concern Present (02/16/2022)  Tobacco Use: Low Risk  (03/17/2023)    Readmission Risk Interventions     No data to display

## 2023-03-20 NOTE — Progress Notes (Signed)
Orthopedic Surgery Progress Note   Assessment: Patient is a 89 y.o. female with right intertrochanteric femur fracture s/p CMN   Plan: -Operative plans: complete -Diet: regular -Antibiotics: ancef x2 post-op doses -DVT ppx: home eliquis -Weight bearing status: as tolerated -PT evaluate and treat -Pain control -Dispo: SNF  ___________________________________________________________________________  Subjective: No acute events overnight. Feels hip pain is getting better. No complaints this morning.    Physical Exam:  General: no acute distress, appears stated age Neurologic: alert, answering questions appropriately, following commands Respiratory: unlabored breathing on room air  MSK:   -Right lower extremity  Dressings with small amount (quarter size) of dry blood at top incision otherwise c/d/i EHL./TA/GSC intact Plantarflexes and dorsiflexes toes Sensation intact to light touch in sural, saphenous, tibial, deep peroneal, and superficial peroneal nerve distributions Foot warm and well perfused   Patient name: Christine Middleton Patient MRN: [id number] Date: 03/20/23

## 2023-03-21 ENCOUNTER — Encounter (HOSPITAL_COMMUNITY): Payer: Self-pay | Admitting: Orthopedic Surgery

## 2023-03-21 DIAGNOSIS — M6281 Muscle weakness (generalized): Secondary | ICD-10-CM | POA: Diagnosis not present

## 2023-03-21 DIAGNOSIS — I480 Paroxysmal atrial fibrillation: Secondary | ICD-10-CM | POA: Diagnosis not present

## 2023-03-21 DIAGNOSIS — S728X1A Other fracture of right femur, initial encounter for closed fracture: Secondary | ICD-10-CM | POA: Diagnosis not present

## 2023-03-21 DIAGNOSIS — D649 Anemia, unspecified: Secondary | ICD-10-CM | POA: Diagnosis not present

## 2023-03-23 DIAGNOSIS — S728X1A Other fracture of right femur, initial encounter for closed fracture: Secondary | ICD-10-CM | POA: Diagnosis not present

## 2023-03-23 DIAGNOSIS — M6281 Muscle weakness (generalized): Secondary | ICD-10-CM | POA: Diagnosis not present

## 2023-03-23 DIAGNOSIS — D649 Anemia, unspecified: Secondary | ICD-10-CM | POA: Diagnosis not present

## 2023-03-23 DIAGNOSIS — R278 Other lack of coordination: Secondary | ICD-10-CM | POA: Diagnosis not present

## 2023-03-23 DIAGNOSIS — I480 Paroxysmal atrial fibrillation: Secondary | ICD-10-CM | POA: Diagnosis not present

## 2023-03-23 DIAGNOSIS — Z9181 History of falling: Secondary | ICD-10-CM | POA: Diagnosis not present

## 2023-03-23 DIAGNOSIS — S72144D Nondisplaced intertrochanteric fracture of right femur, subsequent encounter for closed fracture with routine healing: Secondary | ICD-10-CM | POA: Diagnosis not present

## 2023-03-26 ENCOUNTER — Other Ambulatory Visit: Payer: Self-pay

## 2023-03-26 ENCOUNTER — Encounter (HOSPITAL_COMMUNITY): Payer: Self-pay

## 2023-03-26 ENCOUNTER — Emergency Department (HOSPITAL_COMMUNITY)
Admission: EM | Admit: 2023-03-26 | Discharge: 2023-03-26 | Disposition: A | Discharge status: Home / Self Care | Payer: Medicare Other | Attending: Emergency Medicine | Admitting: Emergency Medicine

## 2023-03-26 DIAGNOSIS — K649 Unspecified hemorrhoids: Secondary | ICD-10-CM | POA: Diagnosis not present

## 2023-03-26 DIAGNOSIS — Z7901 Long term (current) use of anticoagulants: Secondary | ICD-10-CM | POA: Insufficient documentation

## 2023-03-26 DIAGNOSIS — S79929A Unspecified injury of unspecified thigh, initial encounter: Secondary | ICD-10-CM | POA: Diagnosis not present

## 2023-03-26 DIAGNOSIS — R58 Hemorrhage, not elsewhere classified: Secondary | ICD-10-CM | POA: Diagnosis not present

## 2023-03-26 DIAGNOSIS — K922 Gastrointestinal hemorrhage, unspecified: Secondary | ICD-10-CM | POA: Diagnosis not present

## 2023-03-26 DIAGNOSIS — Z79899 Other long term (current) drug therapy: Secondary | ICD-10-CM | POA: Diagnosis not present

## 2023-03-26 DIAGNOSIS — I10 Essential (primary) hypertension: Secondary | ICD-10-CM | POA: Insufficient documentation

## 2023-03-26 DIAGNOSIS — K625 Hemorrhage of anus and rectum: Secondary | ICD-10-CM | POA: Insufficient documentation

## 2023-03-26 DIAGNOSIS — D649 Anemia, unspecified: Secondary | ICD-10-CM | POA: Diagnosis not present

## 2023-03-26 LAB — COMPREHENSIVE METABOLIC PANEL
ALT: 16 U/L (ref 0–44)
AST: 21 U/L (ref 15–41)
Albumin: 3 g/dL — ABNORMAL LOW (ref 3.5–5.0)
Alkaline Phosphatase: 150 U/L — ABNORMAL HIGH (ref 38–126)
Anion gap: 9 (ref 5–15)
BUN: 14 mg/dL (ref 8–23)
CO2: 24 mmol/L (ref 22–32)
Calcium: 9.1 mg/dL (ref 8.9–10.3)
Chloride: 106 mmol/L (ref 98–111)
Creatinine, Ser: 1.12 mg/dL — ABNORMAL HIGH (ref 0.44–1.00)
GFR, Estimated: 44 mL/min — ABNORMAL LOW (ref >60)
Glucose, Bld: 114 mg/dL — ABNORMAL HIGH (ref 70–99)
Potassium: 4 mmol/L (ref 3.5–5.1)
Sodium: 139 mmol/L (ref 135–145)
Total Bilirubin: 1 mg/dL (ref 0.3–1.2)
Total Protein: 6.5 g/dL (ref 6.5–8.1)

## 2023-03-26 LAB — POC OCCULT BLOOD, ED: Fecal Occult Bld: POSITIVE — AB

## 2023-03-26 LAB — LIPASE, BLOOD: Lipase: 33 U/L (ref 11–51)

## 2023-03-26 LAB — CBC WITH DIFFERENTIAL/PLATELET
Abs Immature Granulocytes: 0.08 10*3/uL — ABNORMAL HIGH (ref 0.00–0.07)
Basophils Absolute: 0.1 10*3/uL (ref 0.0–0.1)
Basophils Relative: 1 %
Eosinophils Absolute: 0 10*3/uL (ref 0.0–0.5)
Eosinophils Relative: 0 %
HCT: 29.4 % — ABNORMAL LOW (ref 36.0–46.0)
Hemoglobin: 9.3 g/dL — ABNORMAL LOW (ref 12.0–15.0)
Immature Granulocytes: 1 %
Lymphocytes Relative: 12 %
Lymphs Abs: 1 10*3/uL (ref 0.7–4.0)
MCH: 30.3 pg (ref 26.0–34.0)
MCHC: 31.6 g/dL (ref 30.0–36.0)
MCV: 95.8 fL (ref 80.0–100.0)
Monocytes Absolute: 0.8 10*3/uL (ref 0.1–1.0)
Monocytes Relative: 10 %
Neutro Abs: 5.9 10*3/uL (ref 1.7–7.7)
Neutrophils Relative %: 76 %
Platelets: 385 10*3/uL (ref 150–400)
RBC: 3.07 MIL/uL — ABNORMAL LOW (ref 3.87–5.11)
RDW: 20.5 % — ABNORMAL HIGH (ref 11.5–15.5)
WBC: 7.8 10*3/uL (ref 4.0–10.5)
nRBC: 0 % (ref 0.0–0.2)

## 2023-03-26 LAB — TYPE AND SCREEN
ABO/RH(D): O POS
Antibody Screen: NEGATIVE

## 2023-03-26 NOTE — Discharge Instructions (Signed)
Your bleeding is likely due to hemorrhoidal bleed likely exacerbated by blood thinner medication.  However please continue with current medication and follow-up closely with your GI specialist for further management.  If your condition worsen please return.  Your hemoglobin is stable today.

## 2023-03-26 NOTE — ED Provider Notes (Signed)
[place] EMERGENCY DEPARTMENT AT [facility] Provider Note   CSN: [id number] Arrival date & time: 03/26/23  1232     History  Chief Complaint  Patient presents with   Rectal Bleeding    Artesia Dunmore Limbach is a 89 y.o. female.  The history is provided by the patient and medical records. No language interpreter was used.  Rectal Bleeding    89 year old female with significant history of hemorrhoid, GERD, prior stroke currently on Eliquis, hypertension, recent hip fracture brought here via EMS from [facility] rehab with concerns of rectal bleeding.  Patient is a hard of hearing history was a bit difficult to obtain.  Patient states she has history of rectal bleeding in the past that stopped for period of time however while at the rehab facility today she is noticing some blood in the stool.  The staff noticed blood in the stool and was concerned prompting this ER visit.  Patient otherwise denies having any lightheadedness dizziness no abdominal pain no back pain no rectal pain as she denies having constipation.  She mention rectal bleeding is likely coming from her hemorrhoid she has had this in the past.  She is on Eliquis.  She broke her right hip and is at a rehab facility.  Home Medications Prior to Admission medications   Medication Sig Start Date End Date Taking? Authorizing Provider  acetaminophen (TYLENOL) 325 MG tablet Take 2 tablets (650 mg total) by mouth every 8 (eight) hours for 21 days. 03/18/23 04/08/23  London Sheer, MD  apixaban (ELIQUIS) 2.5 MG TABS tablet Take 1 tablet (2.5 mg total) by mouth 2 (two) times daily. 01/02/23   Leroy Sea, MD  atenolol (TENORMIN) 50 MG tablet TAKE 1 TABLET BY MOUTH EVERY DAY 02/06/23   Nelwyn Salisbury, MD  Cholecalciferol (VITAMIN D-3 PO) Take 1 capsule by mouth every evening.    [provider]  docusate sodium (COLACE) 100 MG capsule Take 2 capsules (200 mg total) by mouth 2 (two) times daily. Patient taking  differently: Take 100 mg by mouth 3 (three) times daily. 12/23/22   Leroy Sea, MD  feeding supplement (ENSURE ENLIVE / ENSURE PLUS) LIQD Take 237 mLs by mouth 2 (two) times daily between meals. 03/20/23   Tyrone Nine, MD  Iron, Ferrous Sulfate, 325 (65 Fe) MG TABS Take 325 mg by mouth daily. Patient taking differently: Take 325 mg by mouth daily with lunch. 01/10/23   Nelwyn Salisbury, MD  losartan (COZAAR) 25 MG tablet Take 1 tablet (25 mg total) by mouth every evening. 11/14/22   Nelwyn Salisbury, MD  mirtazapine (REMERON) 15 MG tablet Take 1/2 (7.5 mg) tablet by mouth at bedtime 11/14/22   Nelwyn Salisbury, MD  pantoprazole (PROTONIX) 40 MG tablet TAKE 1 TABLET BY MOUTH EVERY DAY Patient taking differently: Take 40 mg by mouth daily before breakfast. 02/06/23   Nelwyn Salisbury, MD  polyethylene glycol (MIRALAX / GLYCOLAX) 17 g packet Take 17 g by mouth daily for 14 days. 03/26/23 04/09/23  London Sheer, MD  senna-docusate (SENOKOT-S) 8.6-50 MG tablet Take 1 tablet by mouth 2 (two) times daily for 14 days. 03/18/23 04/01/23  London Sheer, MD  vitamin B-12 100 MCG tablet Take 1 tablet (100 mcg total) by mouth daily. 03/20/23   Tyrone Nine, MD      Allergies    Codeine, Lipitor [atorvastatin], Macrobid [nitrofurantoin], Zestril [lisinopril], Amoxil [amoxicillin], and Ultram [tramadol]  Review of Systems   Review of Systems  Gastrointestinal:  Positive for hematochezia.  All other systems reviewed and are negative.   Physical Exam Updated Vital Signs BP 137/66   Pulse 77   Temp 97.8 F (36.6 C) (Oral)   Resp 16   SpO2 98%  Physical Exam Vitals and nursing note reviewed.  Constitutional:      General: She is not in acute distress.    Appearance: She is well-developed.  HENT:     Head: Atraumatic.  Eyes:     Conjunctiva/sclera: Conjunctivae normal.  Cardiovascular:     Rate and Rhythm: Normal rate and regular rhythm.     Pulses: Normal pulses.     Heart sounds: Normal heart  sounds.  Pulmonary:     Effort: Pulmonary effort is normal.  Abdominal:     Palpations: Abdomen is soft.     Tenderness: There is no abdominal tenderness.  Genitourinary:    Comments: Chaperone present during exam.  Nonthrombosed external hemorrhoid noted.  Bright red blood noted in rectum.  No anal fissure or rectal tear noted.  Normal rectal tone. Musculoskeletal:     Cervical back: Neck supple.     Comments: Normal-appearing surgical site at the right hip without signs of infection.  Patient able to range her hip.  Skin:    Findings: No rash.  Neurological:     Mental Status: She is alert.  Psychiatric:        Mood and Affect: Mood normal.     ED Results / Procedures / Treatments   Labs (all labs ordered are listed, but only abnormal results are displayed) Labs Reviewed  CBC WITH DIFFERENTIAL/PLATELET - Abnormal; Notable for the following components:      Result Value   RBC 3.07 (*)    Hemoglobin 9.3 (*)    HCT 29.4 (*)    RDW 20.5 (*)    Abs Immature Granulocytes 0.08 (*)    All other components within normal limits  COMPREHENSIVE METABOLIC PANEL - Abnormal; Notable for the following components:   Glucose, Bld 114 (*)    Creatinine, Ser 1.12 (*)    Albumin 3.0 (*)    Alkaline Phosphatase 150 (*)    GFR, Estimated 44 (*)    All other components within normal limits  POC OCCULT BLOOD, ED - Abnormal; Notable for the following components:   Fecal Occult Bld POSITIVE (*)    All other components within normal limits  LIPASE, BLOOD  URINALYSIS, ROUTINE W REFLEX MICROSCOPIC  TYPE AND SCREEN    EKG None  Radiology No results found.  Procedures Procedures    Medications Ordered in ED Medications - No data to display  ED Course/ Medical Decision Making/ A&P                                 Medical Decision Making Amount and/or Complexity of Data Reviewed Labs: ordered.   BP 137/66   Pulse 77   Temp 97.8 F (36.6 C) (Oral)   Resp 16   SpO2 98%   29:5  PM 89 year old female with significant history of hemorrhoid, GERD, prior stroke currently on Eliquis, hypertension, recent hip fracture brought here via EMS from [facility] rehab with concerns of rectal bleeding.  Patient is a hard of hearing history was a bit difficult to obtain.  Patient states she has history of rectal bleeding in the past that stopped for  period of time however while at the rehab facility today she is noticing some blood in the stool.  The staff noticed blood in the stool and was concerned prompting this ER visit.  Patient otherwise denies having any lightheadedness dizziness no abdominal pain no back pain no rectal pain as she denies having constipation.  She mention rectal bleeding is likely coming from her hemorrhoid she has had this in the past.  She is on Eliquis.  She broke her right hip and is at a rehab facility.  On exam this is an elderly female lying comfortably in bed appears to be in no acute discomfort.  Heart is irregularly irregular lungs are clear abdomen soft nontender rectal exam showing nonthrombosed hemorrhoid with bright red blood per rectum.  No rectal tenderness or stool impaction  -Labs ordered, independently viewed and interpreted by me.  Labs remarkable for fecal occult positive.  -The patient was maintained on a cardiac monitor.  I personally viewed and interpreted the cardiac monitored which showed an underlying rhythm of: NSR -Imaging including abd/pelvis CT considered but not performed as pt without abd pain, doubt perforation.   -This patient presents to the ED for concern of rectal bleeding, this involves an extensive number of treatment options, and is a complaint that carries with it a high risk of complications and morbidity.  The differential diagnosis includes hemorrhoidal bleed, AVM, malignancy, colitis, diverticular bleed -Co morbidities that complicate the patient evaluation includes stroke, on blood thinner, HTN, GERD -Treatment includes  monitoring -Reevaluation of the patient after these medicines showed that the patient stayed the same -PCP office notes or outside notes reviewed -Discussion with specialist attending Dr. Rodena Medin -Escalation to admission/observation considered: patients feels much better, is comfortable with discharge, and will follow up with PCP -Prescription medication considered, patient comfortable with continue with current treatment -Social Determinant of Health considered which includes lack of mobility  I discussed care with patient's daughter over the phone.  They are aware of plan.  Will discharge patient back to the facility.  She will continue with her blood thinner medication after we discussed risk versus benefit however if condition worsen she should return to the ED for reassessment.        Final Clinical Impression(s) / ED Diagnoses Final diagnoses:  Bleeding hemorrhoid    Rx / DC Orders ED Discharge Orders     None         Fayrene Helper, PA-C 03/26/23 1513    Wynetta Fines, MD 03/26/23 [phone]

## 2023-03-26 NOTE — ED Notes (Signed)
Family updated as to patient's status and plan for her to return to facility. Daughter, Eber Jones, updated and is comfortable with plan.

## 2023-03-26 NOTE — ED Triage Notes (Signed)
Patient BIB [facility] from [facility] for rectal bleeding since being in rehab, patient reports hx of hemorrhoids prior to admission. A&Ox4, in rehab for recent hip fx. VSS, only reports pain to replaced hip.

## 2023-03-28 DIAGNOSIS — K219 Gastro-esophageal reflux disease without esophagitis: Secondary | ICD-10-CM | POA: Diagnosis not present

## 2023-03-28 DIAGNOSIS — M6281 Muscle weakness (generalized): Secondary | ICD-10-CM | POA: Diagnosis not present

## 2023-03-28 DIAGNOSIS — S728X1A Other fracture of right femur, initial encounter for closed fracture: Secondary | ICD-10-CM | POA: Diagnosis not present

## 2023-03-28 DIAGNOSIS — D649 Anemia, unspecified: Secondary | ICD-10-CM | POA: Diagnosis not present

## 2023-03-29 DIAGNOSIS — I480 Paroxysmal atrial fibrillation: Secondary | ICD-10-CM | POA: Diagnosis not present

## 2023-03-29 DIAGNOSIS — D649 Anemia, unspecified: Secondary | ICD-10-CM | POA: Diagnosis not present

## 2023-03-29 DIAGNOSIS — S728X1A Other fracture of right femur, initial encounter for closed fracture: Secondary | ICD-10-CM | POA: Diagnosis not present

## 2023-03-29 DIAGNOSIS — K922 Gastrointestinal hemorrhage, unspecified: Secondary | ICD-10-CM | POA: Diagnosis not present

## 2023-03-30 DIAGNOSIS — M6281 Muscle weakness (generalized): Secondary | ICD-10-CM | POA: Diagnosis not present

## 2023-03-30 DIAGNOSIS — D649 Anemia, unspecified: Secondary | ICD-10-CM | POA: Diagnosis not present

## 2023-03-30 DIAGNOSIS — S728X1A Other fracture of right femur, initial encounter for closed fracture: Secondary | ICD-10-CM | POA: Diagnosis not present

## 2023-03-30 DIAGNOSIS — S72144D Nondisplaced intertrochanteric fracture of right femur, subsequent encounter for closed fracture with routine healing: Secondary | ICD-10-CM | POA: Diagnosis not present

## 2023-03-30 DIAGNOSIS — R278 Other lack of coordination: Secondary | ICD-10-CM | POA: Diagnosis not present

## 2023-03-30 DIAGNOSIS — I480 Paroxysmal atrial fibrillation: Secondary | ICD-10-CM | POA: Diagnosis not present

## 2023-03-30 DIAGNOSIS — Z9181 History of falling: Secondary | ICD-10-CM | POA: Diagnosis not present

## 2023-04-03 DIAGNOSIS — R300 Dysuria: Secondary | ICD-10-CM | POA: Diagnosis not present

## 2023-04-03 DIAGNOSIS — S728X1A Other fracture of right femur, initial encounter for closed fracture: Secondary | ICD-10-CM | POA: Diagnosis not present

## 2023-04-03 DIAGNOSIS — S72144D Nondisplaced intertrochanteric fracture of right femur, subsequent encounter for closed fracture with routine healing: Secondary | ICD-10-CM | POA: Diagnosis not present

## 2023-04-03 DIAGNOSIS — R278 Other lack of coordination: Secondary | ICD-10-CM | POA: Diagnosis not present

## 2023-04-03 DIAGNOSIS — Z9181 History of falling: Secondary | ICD-10-CM | POA: Diagnosis not present

## 2023-04-03 DIAGNOSIS — I480 Paroxysmal atrial fibrillation: Secondary | ICD-10-CM | POA: Diagnosis not present

## 2023-04-03 DIAGNOSIS — M6281 Muscle weakness (generalized): Secondary | ICD-10-CM | POA: Diagnosis not present

## 2023-04-06 DIAGNOSIS — Z9181 History of falling: Secondary | ICD-10-CM | POA: Diagnosis not present

## 2023-04-06 DIAGNOSIS — S728X1A Other fracture of right femur, initial encounter for closed fracture: Secondary | ICD-10-CM | POA: Diagnosis not present

## 2023-04-06 DIAGNOSIS — R300 Dysuria: Secondary | ICD-10-CM | POA: Diagnosis not present

## 2023-04-06 DIAGNOSIS — M6281 Muscle weakness (generalized): Secondary | ICD-10-CM | POA: Diagnosis not present

## 2023-04-06 DIAGNOSIS — R278 Other lack of coordination: Secondary | ICD-10-CM | POA: Diagnosis not present

## 2023-04-06 DIAGNOSIS — I480 Paroxysmal atrial fibrillation: Secondary | ICD-10-CM | POA: Diagnosis not present

## 2023-04-06 DIAGNOSIS — S72144D Nondisplaced intertrochanteric fracture of right femur, subsequent encounter for closed fracture with routine healing: Secondary | ICD-10-CM | POA: Diagnosis not present

## 2023-04-08 ENCOUNTER — Other Ambulatory Visit: Payer: Self-pay | Admitting: Family Medicine

## 2023-04-11 ENCOUNTER — Ambulatory Visit: Payer: Medicare Other | Admitting: Orthopedic Surgery

## 2023-04-11 ENCOUNTER — Other Ambulatory Visit (INDEPENDENT_AMBULATORY_CARE_PROVIDER_SITE_OTHER): Payer: Medicare Other

## 2023-04-11 DIAGNOSIS — S72001D Fracture of unspecified part of neck of right femur, subsequent encounter for closed fracture with routine healing: Secondary | ICD-10-CM | POA: Diagnosis not present

## 2023-04-11 DIAGNOSIS — S72141A Displaced intertrochanteric fracture of right femur, initial encounter for closed fracture: Secondary | ICD-10-CM

## 2023-04-11 DIAGNOSIS — R262 Difficulty in walking, not elsewhere classified: Secondary | ICD-10-CM | POA: Diagnosis not present

## 2023-04-11 DIAGNOSIS — R278 Other lack of coordination: Secondary | ICD-10-CM | POA: Diagnosis not present

## 2023-04-11 DIAGNOSIS — M6281 Muscle weakness (generalized): Secondary | ICD-10-CM | POA: Diagnosis not present

## 2023-04-11 NOTE — Progress Notes (Signed)
Orthopedic Surgery Post-operative Office Visit  Procedure: right hip cephalomedullary rodding Date of Surgery: 03/17/2023 (~3 weeks post-op)  Assessment: Patient is a 89 y.o. who is doing as expected after right hip fracture cephalomedullary fixation   Plan: -Operative plans complete -Staples removed in office today -Okay to let soap/water run over the incision -Weight bearing as tolerated, encouraged ambulation with walker -Pain management: tylenol -Return to office in 4 weeks, x-rays needed at next visit: AP/lateral right femur  ___________________________________________________________________________   Subjective: Patient is now back at home. She has home health PT scheduled to start Monday. She has been ambulating shorter distances (<63ft) with a walker. She is only using tylenol 650mg  TID for pain control. Has not noticed any redness or drainage from her incisions.   Objective:  General: no acute distress, appropriate affect Neurologic: alert, answering questions appropriately, following commands Respiratory: unlabored breathing on room air Skin: incisions are well approximated with no erythema, induration, active/expressible drainage  MSK (spine):  -Strength exam      Left  Right  EHL    5/5  5/5 TA    5/5  5/5 GSC    5/5  5/5 Knee extension  5/5  5/5 Hip flexion   5/5  5/5  -Sensory exam    Sensation intact to light touch in L3-S1 nerve distributions of bilateral lower extremities  Imaging: X-rays of the right femur taken 04/11/2023 were independently reviewed and interpreted, showing cephalomedullary rod in appropriate position. Fracture alignment maintained. No lucency around the screws. No new fractures seen.    Patient name: Christine Middleton Patient MRN: [id number] Date of visit: 04/11/23

## 2023-04-12 ENCOUNTER — Encounter: Payer: Self-pay | Admitting: Gastroenterology

## 2023-04-12 ENCOUNTER — Other Ambulatory Visit: Payer: Medicare Other

## 2023-04-12 ENCOUNTER — Ambulatory Visit: Payer: Medicare Other | Admitting: Gastroenterology

## 2023-04-12 ENCOUNTER — Telehealth: Payer: Self-pay | Admitting: Family Medicine

## 2023-04-12 VITALS — BP 122/70 | HR 74 | Ht 62.0 in | Wt 128.0 lb

## 2023-04-12 DIAGNOSIS — K649 Unspecified hemorrhoids: Secondary | ICD-10-CM

## 2023-04-12 DIAGNOSIS — N1832 Chronic kidney disease, stage 3b: Secondary | ICD-10-CM | POA: Diagnosis not present

## 2023-04-12 DIAGNOSIS — S72141D Displaced intertrochanteric fracture of right femur, subsequent encounter for closed fracture with routine healing: Secondary | ICD-10-CM | POA: Diagnosis not present

## 2023-04-12 DIAGNOSIS — K625 Hemorrhage of anus and rectum: Secondary | ICD-10-CM

## 2023-04-12 DIAGNOSIS — M6281 Muscle weakness (generalized): Secondary | ICD-10-CM | POA: Diagnosis not present

## 2023-04-12 DIAGNOSIS — R410 Disorientation, unspecified: Secondary | ICD-10-CM | POA: Diagnosis not present

## 2023-04-12 DIAGNOSIS — Z7901 Long term (current) use of anticoagulants: Secondary | ICD-10-CM

## 2023-04-12 DIAGNOSIS — I129 Hypertensive chronic kidney disease with stage 1 through stage 4 chronic kidney disease, or unspecified chronic kidney disease: Secondary | ICD-10-CM | POA: Diagnosis not present

## 2023-04-12 DIAGNOSIS — D696 Thrombocytopenia, unspecified: Secondary | ICD-10-CM | POA: Diagnosis not present

## 2023-04-12 DIAGNOSIS — K219 Gastro-esophageal reflux disease without esophagitis: Secondary | ICD-10-CM | POA: Diagnosis not present

## 2023-04-12 DIAGNOSIS — D649 Anemia, unspecified: Secondary | ICD-10-CM

## 2023-04-12 DIAGNOSIS — E559 Vitamin D deficiency, unspecified: Secondary | ICD-10-CM | POA: Diagnosis not present

## 2023-04-12 DIAGNOSIS — D62 Acute posthemorrhagic anemia: Secondary | ICD-10-CM | POA: Diagnosis not present

## 2023-04-12 DIAGNOSIS — D631 Anemia in chronic kidney disease: Secondary | ICD-10-CM | POA: Diagnosis not present

## 2023-04-12 DIAGNOSIS — E538 Deficiency of other specified B group vitamins: Secondary | ICD-10-CM | POA: Diagnosis not present

## 2023-04-12 DIAGNOSIS — I480 Paroxysmal atrial fibrillation: Secondary | ICD-10-CM | POA: Diagnosis not present

## 2023-04-12 DIAGNOSIS — E785 Hyperlipidemia, unspecified: Secondary | ICD-10-CM | POA: Diagnosis not present

## 2023-04-12 DIAGNOSIS — R4181 Age-related cognitive decline: Secondary | ICD-10-CM | POA: Diagnosis not present

## 2023-04-12 DIAGNOSIS — R739 Hyperglycemia, unspecified: Secondary | ICD-10-CM | POA: Diagnosis not present

## 2023-04-12 NOTE — Telephone Encounter (Signed)
Leelee rn with [facility] is calling to report pt had SN  visit and would  need  PT and OT evaluation also pt is having sleeping issues and leelee would like ok to give pt OTC melatonin 3 mg or 5 mg. Please advise

## 2023-04-12 NOTE — Telephone Encounter (Signed)
Spoke with York Pellant with [facility] advised that Dr Clent Ridges approved the orders requested. Verbalized understanding

## 2023-04-12 NOTE — Progress Notes (Signed)
HPI :  89 year old female with a history of A-fib on Eliquis, history of CHF, hemorrhoids with intermittent rectal bleeding, here for follow-up visit to discuss options in regards to rectal bleeding and anemia.   Recall she has had history of rectal bleeding with hemorrhoids in the past.  She had a colonoscopy with Dr. Meridee Score in December 2020 showing grade 3 hemorrhoids and diverticulosis with 1 small polyp.  She had worsening anemia this past May with rectal bleeding that led to hospitalization She had a CTA was negative for active bleeding, showed diverticulosis in sigmoid colon.  I talked with her patient and the family at the time, we discussed consideration for hemorrhoid banding versus medical therapy.  They elected for medical therapy at the time with Anusol as needed, MiraLAX daily to keep stools soft.  They held Eliquis for a few days at the time.    She had been doing okay and stable with intermittent bleeding over time.  She unfortunately fractured her hip in August and was admitted and had surgery.  She has been recovering from that.  She went to rehab for this, daughter states she was not getting her bowel regimen routinely and had some worsening bleeding.  Unclear exactly how much blood she was passing, her hemoglobin had drifted to the eights, she had an ED visit for this and they sent her home.  Her last hemoglobin was on August 26 at 9.3 and had been rising from a low of 8.8 at the end of August.  She recently came home from rehab.  She is accompanied by her daughter Rayfield Citizen today.  They have been giving her MiraLAX every day as well as Colace a few times a day.  She is passing soft brown stool and denies any constipation.  She did have some bleeding noted this past Sunday but has not had bleeding since then.  Patient is hoping to avoid any invasive intervention if she can if at all possible.  They do endorse trial of hemorrhoid banding a few years ago that did seem to help when  she had it done.  She remains off Eliquis, she does have a history of stroke, they are seeing Dr. Clent Ridges next week to discuss if they want to resume this or not.    Past Medical History:  Diagnosis Date   Blood transfusion without reported diagnosis    Diverticulitis    Esophageal stricture    GERD (gastroesophageal reflux disease)    Hyperlipidemia    Hypertension    Shingles    Stroke (HCC)    Vitamin B 12 deficiency      Past Surgical History:  Procedure Laterality Date   ABDOMINAL HYSTERECTOMY     APPENDECTOMY     COLONOSCOPY  12-09-08   per Dr. Jarold Motto (incomplete along with barium enema 12-10-08) with hemorrhoids  and diverticulae   COLONOSCOPY WITH PROPOFOL N/A 07/10/2019   Procedure: COLONOSCOPY WITH PROPOFOL;  Surgeon: Lemar Lofty., MD;  Location: [facility];  Service: Gastroenterology;  Laterality: N/A;   ESOPHAGOGASTRODUODENOSCOPY  09-2004   per Dr. Jarold Motto with dilatation    EYE SURGERY     HEMOSTASIS CLIP PLACEMENT  07/10/2019   Procedure: HEMOSTASIS CLIP PLACEMENT;  Surgeon: Lemar Lofty., MD;  Location: [facility];  Service: Gastroenterology;;   INTRAMEDULLARY (IM) NAIL INTERTROCHANTERIC Right 03/17/2023   Procedure: INTRAMEDULLARY (IM) NAIL INTERTROCHANTERIC;  Surgeon: London Sheer, MD;  Location: [facility] OR;  Service: Orthopedics;  Laterality: Right;   POLYPECTOMY  07/10/2019   Procedure: POLYPECTOMY;  Surgeon: Lemar Lofty., MD;  Location: [facility];  Service: Gastroenterology;;   TONSILLECTOMY     Family History  Problem Relation Age of Onset   Heart attack Mother    Heart disease Father    Stroke Sister    Cancer Brother    Cancer Brother    Diabetes Brother    Breast cancer Other    Diabetes Other    Hypertension Other    Stroke Other    Heart disease Other    Colon cancer Neg Hx    Stomach cancer Neg Hx    Esophageal cancer Neg Hx    Social History   Tobacco Use   Smoking status: Never   Smokeless  tobacco: Never  Vaping Use   Vaping status: Never Used  Substance Use Topics   Alcohol use: No    Alcohol/week: 0.0 standard drinks of alcohol   Drug use: No   Current Outpatient Medications  Medication Sig Dispense Refill   atenolol (TENORMIN) 50 MG tablet TAKE 1 TABLET BY MOUTH EVERY DAY 90 tablet 1   cholecalciferol (VITAMIN D3) 25 MCG (1000 UNIT) tablet Take 1,000 Units by mouth every evening.     docusate sodium (COLACE) 100 MG capsule Take 2 capsules (200 mg total) by mouth 2 (two) times daily. 60 capsule 1   Iron, Ferrous Sulfate, 325 (65 Fe) MG TABS Take 325 mg by mouth daily. 90 tablet 3   lactose free nutrition (BOOST) LIQD Take 237 mLs by mouth 3 (three) times daily between meals.     losartan (COZAAR) 25 MG tablet Take 1 tablet (25 mg total) by mouth every evening. 90 tablet 3   mirtazapine (REMERON) 15 MG tablet Take 1/2 (7.5 mg) tablet by mouth at bedtime (Patient taking differently: Take 7.5 mg by mouth every evening.) 45 tablet 3   pantoprazole (PROTONIX) 40 MG tablet TAKE 1 TABLET BY MOUTH EVERY DAY 90 tablet 0   vitamin B-12 100 MCG tablet Take 1 tablet (100 mcg total) by mouth daily.     apixaban (ELIQUIS) 2.5 MG TABS tablet Take 1 tablet (2.5 mg total) by mouth 2 (two) times daily. (Patient not taking: Reported on 03/26/2023) 180 tablet 3   oxyCODONE (OXY IR/ROXICODONE) 5 MG immediate release tablet Take 2.5-5 mg by mouth every 4 (four) hours as needed for severe pain or moderate pain.     No current facility-administered medications for this visit.   Allergies  Allergen Reactions   Codeine Other (See Comments)    Unknown reaction   Lipitor [Atorvastatin] Other (See Comments)    Myalgias  Joint stiffness   Macrobid [Nitrofurantoin] Other (See Comments)    Unknown reaction   Zestril [Lisinopril] Other (See Comments)    Unknown reaction   Amoxil [Amoxicillin] Rash   Ultram [Tramadol] Anxiety and Other (See Comments)    Sedation Worsening depression      Review of Systems: All systems reviewed and negative except where noted in HPI.      Latest Ref Rng & Units 03/26/2023    1:11 PM 03/19/2023    3:23 AM 03/18/2023    4:20 AM  CBC  WBC 4.0 - 10.5 K/uL 7.8  9.5  12.0   Hemoglobin 12.0 - 15.0 g/dL 9.3  9.1  8.8   Hematocrit 36.0 - 46.0 % 29.4  28.1  26.6   Platelets 150 - 400 K/uL 385  173  111      Physical Exam:  BP 122/70   Pulse 74   Ht 5\' 2"  (1.575 m)   Wt 128 lb (58.1 kg)   BMI 23.41 kg/m  Constitutional: Pleasant, female in no acute distress, sitting in wheelchair Neurological: Alert and oriented to person place and time. Psychiatric: Normal mood and affect. Behavior is normal.   ASSESSMENT: 89 y.o. female here for assessment of the following  1. Rectal bleeding   2. Hemorrhoids, unspecified hemorrhoid type   3. Anemia, unspecified type   4. Anticoagulated    Patient with ongoing intermittent rectal bleeding associated with anemia while anticoagulated on Eliquis.  She has had a prior evaluation for this with colonoscopy,  and etiology is hemorrhoids.  With conservative measures of a bowel regimen and Anusol her symptoms had been minimized but got worse when she broke her hip, at rehab was not on her bowel regimen and had some worsening bleeding.  Led to recent ED visit for hemoglobin had drifted to 8 but has since been rising.  Now she is fortunately back at home with her daughter.  She is on a good bowel regimen and bleeding seems to be improving.  She remains off Eliquis for now.  Discussed how aggressive we want to be with the patient and her daughter Eber Jones, in regards to interventional therapy to the hemorrhoids.  If she needs escalation of therapy for hemorrhoids it would likely be hemorrhoid banding. She is not a good surgical candidate. We discussed what it is, risks for her namely would be increased risk for severe bleeding roughly 2 weeks after banding, risk is less than 1:1000 in general, would be higher  risk if she is resumed on Eliquis.  Right now they would like to hold off and we will check her hemoglobin to make sure this is rising appropriately if her hemoglobin is stable and rising off Eliquis they would prefer to hold off on banding.  If it is downtrending despite being off Eliquis and continued bleeding then I think we may need to really consider doing this.  They are in agreement.  Will await her hemoglobin.  In the interim continue MiraLAX daily and Colace a few times daily.  I gave them some Calmol 4 samples to use, suppository twice daily at baseline.  If she needs to use Anusol she can added on as needed but I would prefer not to use Anusol daily indefinitely.  Discussed risks of doing that.  The patient's daughter Eber Jones will keep in touch with me on her course.  They will speak with Dr. Clent Ridges about whether or not they want to resume anticoagulation or not.  If they do resume it and she has continued bleeding, we may need to consider hemorrhoid banding either on or off Eliquis.  They understand risk of stroke being off Eliquis versus risk of bleeding and anemia with hospitalization on the Eliquis..  This is a difficult situation, patient and her daughter understand both sides of this and for now wish to continue supportive measures and hold off on banding if possible.  PLAN: - continue Miralax and colace daily - keep stools soft - will give her Calmol4 suppository samples today - use daily to BID - can get over the counter otherwise - Anusol PRN but not daily - Hgb today - make sure stable - consideration for banding pending her course. May need to consider if worsening anemia despite being off Eliquis - seeing PCP next week to determine if resuming anticoagulation or not. She is hoping to  avoid banding if possible. - will await labs and follow up decision and discuss with her daughter Eber Jones - I asked her contact me for follow up in a few days.   Harlin Rain, MD [facility]  Gastroenterology

## 2023-04-12 NOTE — Patient Instructions (Signed)
Your provider has requested that you go to the basement level for lab work before leaving today. Press "B" on the elevator. The lab is located at the first door on the left as you exit the elevator.   Continue Miralax and Colace daily.   You have been given Calmol4 suppositories to use daily, up to twice daily. This can be purchased over the counter.   Apply Anusol as needed, not every day.  Consider hemorrhoid bandings.  Due to recent changes in healthcare laws, you may see the results of your imaging and laboratory studies on MyChart before your provider has had a chance to review them.  We understand that in some cases there may be results that are confusing or concerning to you. Not all laboratory results come back in the same time frame and the provider may be waiting for multiple results in order to interpret others.  Please give [place] 48 hours in order for your provider to thoroughly review all the results before contacting the office for clarification of your results.   The [place] [facility] providers would like to encourage you to use [facility] to communicate with providers for non-urgent requests or questions.  Due to long hold times on the telephone, sending your provider a message by [facility] may be a faster and more efficient way to get a response.  Please allow 48 business hours for a response.  Please remember that this is for non-urgent requests.   Thank you for entrusting me with your care and for choosing [facility], Dr. Ileene Patrick

## 2023-04-12 NOTE — Telephone Encounter (Signed)
Let her try OTC melatonin 5 mg at bedtime for sleep. Also please set up OT and PT

## 2023-04-13 ENCOUNTER — Telehealth: Payer: Self-pay | Admitting: Family Medicine

## 2023-04-13 DIAGNOSIS — M6281 Muscle weakness (generalized): Secondary | ICD-10-CM | POA: Diagnosis not present

## 2023-04-13 DIAGNOSIS — S72141D Displaced intertrochanteric fracture of right femur, subsequent encounter for closed fracture with routine healing: Secondary | ICD-10-CM | POA: Diagnosis not present

## 2023-04-13 DIAGNOSIS — E538 Deficiency of other specified B group vitamins: Secondary | ICD-10-CM | POA: Diagnosis not present

## 2023-04-13 DIAGNOSIS — E785 Hyperlipidemia, unspecified: Secondary | ICD-10-CM | POA: Diagnosis not present

## 2023-04-13 DIAGNOSIS — D631 Anemia in chronic kidney disease: Secondary | ICD-10-CM | POA: Diagnosis not present

## 2023-04-13 DIAGNOSIS — R739 Hyperglycemia, unspecified: Secondary | ICD-10-CM | POA: Diagnosis not present

## 2023-04-13 DIAGNOSIS — I480 Paroxysmal atrial fibrillation: Secondary | ICD-10-CM | POA: Diagnosis not present

## 2023-04-13 DIAGNOSIS — I129 Hypertensive chronic kidney disease with stage 1 through stage 4 chronic kidney disease, or unspecified chronic kidney disease: Secondary | ICD-10-CM | POA: Diagnosis not present

## 2023-04-13 DIAGNOSIS — K219 Gastro-esophageal reflux disease without esophagitis: Secondary | ICD-10-CM | POA: Diagnosis not present

## 2023-04-13 DIAGNOSIS — D696 Thrombocytopenia, unspecified: Secondary | ICD-10-CM | POA: Diagnosis not present

## 2023-04-13 DIAGNOSIS — E559 Vitamin D deficiency, unspecified: Secondary | ICD-10-CM | POA: Diagnosis not present

## 2023-04-13 DIAGNOSIS — N1832 Chronic kidney disease, stage 3b: Secondary | ICD-10-CM | POA: Diagnosis not present

## 2023-04-13 DIAGNOSIS — R4181 Age-related cognitive decline: Secondary | ICD-10-CM | POA: Diagnosis not present

## 2023-04-13 DIAGNOSIS — R410 Disorientation, unspecified: Secondary | ICD-10-CM | POA: Diagnosis not present

## 2023-04-13 DIAGNOSIS — K649 Unspecified hemorrhoids: Secondary | ICD-10-CM | POA: Diagnosis not present

## 2023-04-13 DIAGNOSIS — D62 Acute posthemorrhagic anemia: Secondary | ICD-10-CM | POA: Diagnosis not present

## 2023-04-13 NOTE — Telephone Encounter (Signed)
Alexis OT with [facility] is calling and need VO 1x4

## 2023-04-16 ENCOUNTER — Ambulatory Visit (INDEPENDENT_AMBULATORY_CARE_PROVIDER_SITE_OTHER): Payer: Medicare Other | Admitting: Family Medicine

## 2023-04-16 ENCOUNTER — Encounter: Payer: Self-pay | Admitting: Family Medicine

## 2023-04-16 VITALS — BP 110/60 | HR 65 | Temp 97.8°F | Wt 128.0 lb

## 2023-04-16 DIAGNOSIS — K219 Gastro-esophageal reflux disease without esophagitis: Secondary | ICD-10-CM | POA: Diagnosis not present

## 2023-04-16 DIAGNOSIS — R739 Hyperglycemia, unspecified: Secondary | ICD-10-CM | POA: Diagnosis not present

## 2023-04-16 DIAGNOSIS — I129 Hypertensive chronic kidney disease with stage 1 through stage 4 chronic kidney disease, or unspecified chronic kidney disease: Secondary | ICD-10-CM | POA: Diagnosis not present

## 2023-04-16 DIAGNOSIS — E785 Hyperlipidemia, unspecified: Secondary | ICD-10-CM | POA: Diagnosis not present

## 2023-04-16 DIAGNOSIS — D631 Anemia in chronic kidney disease: Secondary | ICD-10-CM | POA: Diagnosis not present

## 2023-04-16 DIAGNOSIS — E538 Deficiency of other specified B group vitamins: Secondary | ICD-10-CM

## 2023-04-16 DIAGNOSIS — I480 Paroxysmal atrial fibrillation: Secondary | ICD-10-CM | POA: Diagnosis not present

## 2023-04-16 DIAGNOSIS — S72141D Displaced intertrochanteric fracture of right femur, subsequent encounter for closed fracture with routine healing: Secondary | ICD-10-CM

## 2023-04-16 DIAGNOSIS — M6281 Muscle weakness (generalized): Secondary | ICD-10-CM | POA: Diagnosis not present

## 2023-04-16 DIAGNOSIS — D696 Thrombocytopenia, unspecified: Secondary | ICD-10-CM | POA: Diagnosis not present

## 2023-04-16 DIAGNOSIS — D62 Acute posthemorrhagic anemia: Secondary | ICD-10-CM | POA: Diagnosis not present

## 2023-04-16 DIAGNOSIS — K625 Hemorrhage of anus and rectum: Secondary | ICD-10-CM | POA: Diagnosis not present

## 2023-04-16 DIAGNOSIS — R410 Disorientation, unspecified: Secondary | ICD-10-CM | POA: Diagnosis not present

## 2023-04-16 DIAGNOSIS — K649 Unspecified hemorrhoids: Secondary | ICD-10-CM | POA: Diagnosis not present

## 2023-04-16 DIAGNOSIS — R4181 Age-related cognitive decline: Secondary | ICD-10-CM | POA: Diagnosis not present

## 2023-04-16 DIAGNOSIS — S72141A Displaced intertrochanteric fracture of right femur, initial encounter for closed fracture: Secondary | ICD-10-CM

## 2023-04-16 DIAGNOSIS — N1832 Chronic kidney disease, stage 3b: Secondary | ICD-10-CM | POA: Diagnosis not present

## 2023-04-16 DIAGNOSIS — E559 Vitamin D deficiency, unspecified: Secondary | ICD-10-CM | POA: Diagnosis not present

## 2023-04-16 LAB — CBC WITH DIFFERENTIAL/PLATELET
Basophils Absolute: 0.1 10*3/uL (ref 0.0–0.1)
Basophils Relative: 1.2 % (ref 0.0–3.0)
Eosinophils Absolute: 0.3 10*3/uL (ref 0.0–0.7)
Eosinophils Relative: 4.5 % (ref 0.0–5.0)
HCT: 31.5 % — ABNORMAL LOW (ref 36.0–46.0)
Hemoglobin: 10.1 g/dL — ABNORMAL LOW (ref 12.0–15.0)
Lymphocytes Relative: 16.4 % (ref 12.0–46.0)
Lymphs Abs: 1 10*3/uL (ref 0.7–4.0)
MCHC: 32.1 g/dL (ref 30.0–36.0)
MCV: 95.9 fl (ref 78.0–100.0)
Monocytes Absolute: 0.8 10*3/uL (ref 0.1–1.0)
Monocytes Relative: 12.1 % — ABNORMAL HIGH (ref 3.0–12.0)
Neutro Abs: 4.1 10*3/uL (ref 1.4–7.7)
Neutrophils Relative %: 65.8 % (ref 43.0–77.0)
Platelets: 283 10*3/uL (ref 150.0–400.0)
RBC: 3.29 Mil/uL — ABNORMAL LOW (ref 3.87–5.11)
RDW: 15.5 % (ref 11.5–15.5)
WBC: 6.3 10*3/uL (ref 4.0–10.5)

## 2023-04-16 LAB — VITAMIN B12: Vitamin B-12: 381 pg/mL (ref 211–911)

## 2023-04-16 NOTE — Progress Notes (Signed)
Subjective:    Patient ID: Christine Middleton, female    DOB: [birth year], 89 y.o.   MRN: [id number]  HPI Here with her daughter to follow up a hospital stay from 03-15-23 to 03-20-23 and then a rehab stay at [facility] until she went home on 04-08-23. She was admitted with an intertrochanteric fracture to the right hip, and an intramedullary nail was placed. She was anemic, and her Hgb dropped to 8.1 at one point. She was transfused and this came up to 10.9 at DC. Her creatinine was stable at 1.12 with a GFR of 44. In the middle of all this on 03-26-23 she was taken to the ED for bleeding from the rectum. This stopped on its own, and she has had none since then. She was taken off Eliquis in the hospital due to the risk of future falls. Today she has fairly good pain control. She can walk from one room to another with a walker and with assistance, but she mostly uses a wheelchair. Her appetite is good. She has no complaints today.    Review of Systems  Constitutional:  Positive for fatigue.  Respiratory: Negative.    Cardiovascular: Negative.   Gastrointestinal: Negative.   Genitourinary: Negative.        Objective:   Physical Exam Constitutional:      Comments: In a wheelchair   Cardiovascular:     Rate and Rhythm: Normal rate and regular rhythm.     Pulses: Normal pulses.     Heart sounds: Normal heart sounds.  Pulmonary:     Effort: Pulmonary effort is normal.     Breath sounds: Normal breath sounds.  Musculoskeletal:     Right lower leg: No edema.     Left lower leg: No edema.  Neurological:     Mental Status: She is alert and oriented to person, place, and time. Mental status is at baseline.  Psychiatric:        Mood and Affect: Mood normal.           Assessment & Plan:  She is recovering well from a right hip fracture. She will continue to use a wheelchair or a walker with assistance for the time being. We will check a CBC and a B12 level today. We spent a total of (  35 ) minutes  reviewing records and discussing these issues.  Gershon Crane, MD

## 2023-04-17 DIAGNOSIS — K649 Unspecified hemorrhoids: Secondary | ICD-10-CM | POA: Diagnosis not present

## 2023-04-17 DIAGNOSIS — E785 Hyperlipidemia, unspecified: Secondary | ICD-10-CM | POA: Diagnosis not present

## 2023-04-17 DIAGNOSIS — D696 Thrombocytopenia, unspecified: Secondary | ICD-10-CM | POA: Diagnosis not present

## 2023-04-17 DIAGNOSIS — D62 Acute posthemorrhagic anemia: Secondary | ICD-10-CM | POA: Diagnosis not present

## 2023-04-17 DIAGNOSIS — E559 Vitamin D deficiency, unspecified: Secondary | ICD-10-CM | POA: Diagnosis not present

## 2023-04-17 DIAGNOSIS — M6281 Muscle weakness (generalized): Secondary | ICD-10-CM | POA: Diagnosis not present

## 2023-04-17 DIAGNOSIS — I129 Hypertensive chronic kidney disease with stage 1 through stage 4 chronic kidney disease, or unspecified chronic kidney disease: Secondary | ICD-10-CM | POA: Diagnosis not present

## 2023-04-17 DIAGNOSIS — S72141D Displaced intertrochanteric fracture of right femur, subsequent encounter for closed fracture with routine healing: Secondary | ICD-10-CM | POA: Diagnosis not present

## 2023-04-17 DIAGNOSIS — R4181 Age-related cognitive decline: Secondary | ICD-10-CM | POA: Diagnosis not present

## 2023-04-17 DIAGNOSIS — R739 Hyperglycemia, unspecified: Secondary | ICD-10-CM | POA: Diagnosis not present

## 2023-04-17 DIAGNOSIS — I480 Paroxysmal atrial fibrillation: Secondary | ICD-10-CM | POA: Diagnosis not present

## 2023-04-17 DIAGNOSIS — E538 Deficiency of other specified B group vitamins: Secondary | ICD-10-CM | POA: Diagnosis not present

## 2023-04-17 DIAGNOSIS — N1832 Chronic kidney disease, stage 3b: Secondary | ICD-10-CM | POA: Diagnosis not present

## 2023-04-17 DIAGNOSIS — K219 Gastro-esophageal reflux disease without esophagitis: Secondary | ICD-10-CM | POA: Diagnosis not present

## 2023-04-17 DIAGNOSIS — D631 Anemia in chronic kidney disease: Secondary | ICD-10-CM | POA: Diagnosis not present

## 2023-04-17 DIAGNOSIS — R410 Disorientation, unspecified: Secondary | ICD-10-CM | POA: Diagnosis not present

## 2023-04-18 DIAGNOSIS — K649 Unspecified hemorrhoids: Secondary | ICD-10-CM | POA: Diagnosis not present

## 2023-04-18 DIAGNOSIS — R739 Hyperglycemia, unspecified: Secondary | ICD-10-CM | POA: Diagnosis not present

## 2023-04-18 DIAGNOSIS — I129 Hypertensive chronic kidney disease with stage 1 through stage 4 chronic kidney disease, or unspecified chronic kidney disease: Secondary | ICD-10-CM | POA: Diagnosis not present

## 2023-04-18 DIAGNOSIS — N1832 Chronic kidney disease, stage 3b: Secondary | ICD-10-CM | POA: Diagnosis not present

## 2023-04-18 DIAGNOSIS — D62 Acute posthemorrhagic anemia: Secondary | ICD-10-CM | POA: Diagnosis not present

## 2023-04-18 DIAGNOSIS — K219 Gastro-esophageal reflux disease without esophagitis: Secondary | ICD-10-CM | POA: Diagnosis not present

## 2023-04-18 DIAGNOSIS — D631 Anemia in chronic kidney disease: Secondary | ICD-10-CM | POA: Diagnosis not present

## 2023-04-18 DIAGNOSIS — R4181 Age-related cognitive decline: Secondary | ICD-10-CM | POA: Diagnosis not present

## 2023-04-18 DIAGNOSIS — E538 Deficiency of other specified B group vitamins: Secondary | ICD-10-CM | POA: Diagnosis not present

## 2023-04-18 DIAGNOSIS — E559 Vitamin D deficiency, unspecified: Secondary | ICD-10-CM | POA: Diagnosis not present

## 2023-04-18 DIAGNOSIS — D696 Thrombocytopenia, unspecified: Secondary | ICD-10-CM | POA: Diagnosis not present

## 2023-04-18 DIAGNOSIS — M6281 Muscle weakness (generalized): Secondary | ICD-10-CM | POA: Diagnosis not present

## 2023-04-18 DIAGNOSIS — I480 Paroxysmal atrial fibrillation: Secondary | ICD-10-CM | POA: Diagnosis not present

## 2023-04-18 DIAGNOSIS — E785 Hyperlipidemia, unspecified: Secondary | ICD-10-CM | POA: Diagnosis not present

## 2023-04-18 DIAGNOSIS — R410 Disorientation, unspecified: Secondary | ICD-10-CM | POA: Diagnosis not present

## 2023-04-18 DIAGNOSIS — S72141D Displaced intertrochanteric fracture of right femur, subsequent encounter for closed fracture with routine healing: Secondary | ICD-10-CM | POA: Diagnosis not present

## 2023-04-18 NOTE — Addendum Note (Signed)
Addended by: Johnella Moloney on: 04/18/2023 11:36 AM   Modules accepted: Orders

## 2023-04-19 DIAGNOSIS — N1832 Chronic kidney disease, stage 3b: Secondary | ICD-10-CM | POA: Diagnosis not present

## 2023-04-19 DIAGNOSIS — E785 Hyperlipidemia, unspecified: Secondary | ICD-10-CM | POA: Diagnosis not present

## 2023-04-19 DIAGNOSIS — R410 Disorientation, unspecified: Secondary | ICD-10-CM | POA: Diagnosis not present

## 2023-04-19 DIAGNOSIS — D62 Acute posthemorrhagic anemia: Secondary | ICD-10-CM | POA: Diagnosis not present

## 2023-04-19 DIAGNOSIS — M6281 Muscle weakness (generalized): Secondary | ICD-10-CM | POA: Diagnosis not present

## 2023-04-19 DIAGNOSIS — D696 Thrombocytopenia, unspecified: Secondary | ICD-10-CM | POA: Diagnosis not present

## 2023-04-19 DIAGNOSIS — S72141D Displaced intertrochanteric fracture of right femur, subsequent encounter for closed fracture with routine healing: Secondary | ICD-10-CM | POA: Diagnosis not present

## 2023-04-19 DIAGNOSIS — D631 Anemia in chronic kidney disease: Secondary | ICD-10-CM | POA: Diagnosis not present

## 2023-04-19 DIAGNOSIS — E559 Vitamin D deficiency, unspecified: Secondary | ICD-10-CM | POA: Diagnosis not present

## 2023-04-19 DIAGNOSIS — E538 Deficiency of other specified B group vitamins: Secondary | ICD-10-CM | POA: Diagnosis not present

## 2023-04-19 DIAGNOSIS — I480 Paroxysmal atrial fibrillation: Secondary | ICD-10-CM | POA: Diagnosis not present

## 2023-04-19 DIAGNOSIS — R4181 Age-related cognitive decline: Secondary | ICD-10-CM | POA: Diagnosis not present

## 2023-04-19 DIAGNOSIS — R739 Hyperglycemia, unspecified: Secondary | ICD-10-CM | POA: Diagnosis not present

## 2023-04-19 DIAGNOSIS — K219 Gastro-esophageal reflux disease without esophagitis: Secondary | ICD-10-CM | POA: Diagnosis not present

## 2023-04-19 DIAGNOSIS — K649 Unspecified hemorrhoids: Secondary | ICD-10-CM | POA: Diagnosis not present

## 2023-04-19 DIAGNOSIS — I129 Hypertensive chronic kidney disease with stage 1 through stage 4 chronic kidney disease, or unspecified chronic kidney disease: Secondary | ICD-10-CM | POA: Diagnosis not present

## 2023-04-20 DIAGNOSIS — R739 Hyperglycemia, unspecified: Secondary | ICD-10-CM

## 2023-04-20 DIAGNOSIS — D631 Anemia in chronic kidney disease: Secondary | ICD-10-CM

## 2023-04-20 DIAGNOSIS — R410 Disorientation, unspecified: Secondary | ICD-10-CM | POA: Diagnosis not present

## 2023-04-20 DIAGNOSIS — D62 Acute posthemorrhagic anemia: Secondary | ICD-10-CM | POA: Diagnosis not present

## 2023-04-20 DIAGNOSIS — I480 Paroxysmal atrial fibrillation: Secondary | ICD-10-CM | POA: Diagnosis not present

## 2023-04-20 DIAGNOSIS — E785 Hyperlipidemia, unspecified: Secondary | ICD-10-CM

## 2023-04-20 DIAGNOSIS — D696 Thrombocytopenia, unspecified: Secondary | ICD-10-CM

## 2023-04-20 DIAGNOSIS — R4181 Age-related cognitive decline: Secondary | ICD-10-CM

## 2023-04-20 DIAGNOSIS — E559 Vitamin D deficiency, unspecified: Secondary | ICD-10-CM

## 2023-04-20 DIAGNOSIS — N1832 Chronic kidney disease, stage 3b: Secondary | ICD-10-CM

## 2023-04-20 DIAGNOSIS — S72141D Displaced intertrochanteric fracture of right femur, subsequent encounter for closed fracture with routine healing: Secondary | ICD-10-CM | POA: Diagnosis not present

## 2023-04-20 DIAGNOSIS — I129 Hypertensive chronic kidney disease with stage 1 through stage 4 chronic kidney disease, or unspecified chronic kidney disease: Secondary | ICD-10-CM

## 2023-04-23 DIAGNOSIS — D696 Thrombocytopenia, unspecified: Secondary | ICD-10-CM | POA: Diagnosis not present

## 2023-04-23 DIAGNOSIS — E785 Hyperlipidemia, unspecified: Secondary | ICD-10-CM | POA: Diagnosis not present

## 2023-04-23 DIAGNOSIS — R739 Hyperglycemia, unspecified: Secondary | ICD-10-CM | POA: Diagnosis not present

## 2023-04-23 DIAGNOSIS — E559 Vitamin D deficiency, unspecified: Secondary | ICD-10-CM | POA: Diagnosis not present

## 2023-04-23 DIAGNOSIS — R4181 Age-related cognitive decline: Secondary | ICD-10-CM | POA: Diagnosis not present

## 2023-04-23 DIAGNOSIS — I129 Hypertensive chronic kidney disease with stage 1 through stage 4 chronic kidney disease, or unspecified chronic kidney disease: Secondary | ICD-10-CM | POA: Diagnosis not present

## 2023-04-23 DIAGNOSIS — E538 Deficiency of other specified B group vitamins: Secondary | ICD-10-CM | POA: Diagnosis not present

## 2023-04-23 DIAGNOSIS — K219 Gastro-esophageal reflux disease without esophagitis: Secondary | ICD-10-CM | POA: Diagnosis not present

## 2023-04-23 DIAGNOSIS — M6281 Muscle weakness (generalized): Secondary | ICD-10-CM | POA: Diagnosis not present

## 2023-04-23 DIAGNOSIS — N1832 Chronic kidney disease, stage 3b: Secondary | ICD-10-CM | POA: Diagnosis not present

## 2023-04-23 DIAGNOSIS — D631 Anemia in chronic kidney disease: Secondary | ICD-10-CM | POA: Diagnosis not present

## 2023-04-23 DIAGNOSIS — S72141D Displaced intertrochanteric fracture of right femur, subsequent encounter for closed fracture with routine healing: Secondary | ICD-10-CM | POA: Diagnosis not present

## 2023-04-23 DIAGNOSIS — R410 Disorientation, unspecified: Secondary | ICD-10-CM | POA: Diagnosis not present

## 2023-04-23 DIAGNOSIS — I480 Paroxysmal atrial fibrillation: Secondary | ICD-10-CM | POA: Diagnosis not present

## 2023-04-23 DIAGNOSIS — K649 Unspecified hemorrhoids: Secondary | ICD-10-CM | POA: Diagnosis not present

## 2023-04-23 DIAGNOSIS — D62 Acute posthemorrhagic anemia: Secondary | ICD-10-CM | POA: Diagnosis not present

## 2023-04-24 DIAGNOSIS — I480 Paroxysmal atrial fibrillation: Secondary | ICD-10-CM | POA: Diagnosis not present

## 2023-04-24 DIAGNOSIS — E559 Vitamin D deficiency, unspecified: Secondary | ICD-10-CM | POA: Diagnosis not present

## 2023-04-24 DIAGNOSIS — M6281 Muscle weakness (generalized): Secondary | ICD-10-CM | POA: Diagnosis not present

## 2023-04-24 DIAGNOSIS — D62 Acute posthemorrhagic anemia: Secondary | ICD-10-CM | POA: Diagnosis not present

## 2023-04-24 DIAGNOSIS — R4181 Age-related cognitive decline: Secondary | ICD-10-CM | POA: Diagnosis not present

## 2023-04-24 DIAGNOSIS — K649 Unspecified hemorrhoids: Secondary | ICD-10-CM | POA: Diagnosis not present

## 2023-04-24 DIAGNOSIS — N1832 Chronic kidney disease, stage 3b: Secondary | ICD-10-CM | POA: Diagnosis not present

## 2023-04-24 DIAGNOSIS — I129 Hypertensive chronic kidney disease with stage 1 through stage 4 chronic kidney disease, or unspecified chronic kidney disease: Secondary | ICD-10-CM | POA: Diagnosis not present

## 2023-04-24 DIAGNOSIS — R410 Disorientation, unspecified: Secondary | ICD-10-CM | POA: Diagnosis not present

## 2023-04-24 DIAGNOSIS — K219 Gastro-esophageal reflux disease without esophagitis: Secondary | ICD-10-CM | POA: Diagnosis not present

## 2023-04-24 DIAGNOSIS — D631 Anemia in chronic kidney disease: Secondary | ICD-10-CM | POA: Diagnosis not present

## 2023-04-24 DIAGNOSIS — E538 Deficiency of other specified B group vitamins: Secondary | ICD-10-CM | POA: Diagnosis not present

## 2023-04-24 DIAGNOSIS — R739 Hyperglycemia, unspecified: Secondary | ICD-10-CM | POA: Diagnosis not present

## 2023-04-24 DIAGNOSIS — D696 Thrombocytopenia, unspecified: Secondary | ICD-10-CM | POA: Diagnosis not present

## 2023-04-24 DIAGNOSIS — E785 Hyperlipidemia, unspecified: Secondary | ICD-10-CM | POA: Diagnosis not present

## 2023-04-24 DIAGNOSIS — S72141D Displaced intertrochanteric fracture of right femur, subsequent encounter for closed fracture with routine healing: Secondary | ICD-10-CM | POA: Diagnosis not present

## 2023-04-25 DIAGNOSIS — E559 Vitamin D deficiency, unspecified: Secondary | ICD-10-CM | POA: Diagnosis not present

## 2023-04-25 DIAGNOSIS — E538 Deficiency of other specified B group vitamins: Secondary | ICD-10-CM | POA: Diagnosis not present

## 2023-04-25 DIAGNOSIS — S72141D Displaced intertrochanteric fracture of right femur, subsequent encounter for closed fracture with routine healing: Secondary | ICD-10-CM | POA: Diagnosis not present

## 2023-04-25 DIAGNOSIS — I129 Hypertensive chronic kidney disease with stage 1 through stage 4 chronic kidney disease, or unspecified chronic kidney disease: Secondary | ICD-10-CM | POA: Diagnosis not present

## 2023-04-25 DIAGNOSIS — I480 Paroxysmal atrial fibrillation: Secondary | ICD-10-CM | POA: Diagnosis not present

## 2023-04-25 DIAGNOSIS — E785 Hyperlipidemia, unspecified: Secondary | ICD-10-CM | POA: Diagnosis not present

## 2023-04-25 DIAGNOSIS — R4181 Age-related cognitive decline: Secondary | ICD-10-CM | POA: Diagnosis not present

## 2023-04-25 DIAGNOSIS — D62 Acute posthemorrhagic anemia: Secondary | ICD-10-CM | POA: Diagnosis not present

## 2023-04-25 DIAGNOSIS — N1832 Chronic kidney disease, stage 3b: Secondary | ICD-10-CM | POA: Diagnosis not present

## 2023-04-25 DIAGNOSIS — R739 Hyperglycemia, unspecified: Secondary | ICD-10-CM | POA: Diagnosis not present

## 2023-04-25 DIAGNOSIS — K219 Gastro-esophageal reflux disease without esophagitis: Secondary | ICD-10-CM | POA: Diagnosis not present

## 2023-04-25 DIAGNOSIS — K649 Unspecified hemorrhoids: Secondary | ICD-10-CM | POA: Diagnosis not present

## 2023-04-25 DIAGNOSIS — D631 Anemia in chronic kidney disease: Secondary | ICD-10-CM | POA: Diagnosis not present

## 2023-04-25 DIAGNOSIS — M6281 Muscle weakness (generalized): Secondary | ICD-10-CM | POA: Diagnosis not present

## 2023-04-25 DIAGNOSIS — R410 Disorientation, unspecified: Secondary | ICD-10-CM | POA: Diagnosis not present

## 2023-04-25 DIAGNOSIS — D696 Thrombocytopenia, unspecified: Secondary | ICD-10-CM | POA: Diagnosis not present

## 2023-04-30 DIAGNOSIS — S72141D Displaced intertrochanteric fracture of right femur, subsequent encounter for closed fracture with routine healing: Secondary | ICD-10-CM | POA: Diagnosis not present

## 2023-04-30 DIAGNOSIS — D62 Acute posthemorrhagic anemia: Secondary | ICD-10-CM | POA: Diagnosis not present

## 2023-04-30 DIAGNOSIS — I129 Hypertensive chronic kidney disease with stage 1 through stage 4 chronic kidney disease, or unspecified chronic kidney disease: Secondary | ICD-10-CM | POA: Diagnosis not present

## 2023-04-30 DIAGNOSIS — R739 Hyperglycemia, unspecified: Secondary | ICD-10-CM | POA: Diagnosis not present

## 2023-04-30 DIAGNOSIS — E538 Deficiency of other specified B group vitamins: Secondary | ICD-10-CM | POA: Diagnosis not present

## 2023-04-30 DIAGNOSIS — M6281 Muscle weakness (generalized): Secondary | ICD-10-CM | POA: Diagnosis not present

## 2023-04-30 DIAGNOSIS — E785 Hyperlipidemia, unspecified: Secondary | ICD-10-CM | POA: Diagnosis not present

## 2023-04-30 DIAGNOSIS — D696 Thrombocytopenia, unspecified: Secondary | ICD-10-CM | POA: Diagnosis not present

## 2023-04-30 DIAGNOSIS — K649 Unspecified hemorrhoids: Secondary | ICD-10-CM | POA: Diagnosis not present

## 2023-04-30 DIAGNOSIS — R4181 Age-related cognitive decline: Secondary | ICD-10-CM | POA: Diagnosis not present

## 2023-04-30 DIAGNOSIS — R410 Disorientation, unspecified: Secondary | ICD-10-CM | POA: Diagnosis not present

## 2023-04-30 DIAGNOSIS — D631 Anemia in chronic kidney disease: Secondary | ICD-10-CM | POA: Diagnosis not present

## 2023-04-30 DIAGNOSIS — E559 Vitamin D deficiency, unspecified: Secondary | ICD-10-CM | POA: Diagnosis not present

## 2023-04-30 DIAGNOSIS — N1832 Chronic kidney disease, stage 3b: Secondary | ICD-10-CM | POA: Diagnosis not present

## 2023-04-30 DIAGNOSIS — K219 Gastro-esophageal reflux disease without esophagitis: Secondary | ICD-10-CM | POA: Diagnosis not present

## 2023-04-30 DIAGNOSIS — I480 Paroxysmal atrial fibrillation: Secondary | ICD-10-CM | POA: Diagnosis not present

## 2023-05-02 DIAGNOSIS — E785 Hyperlipidemia, unspecified: Secondary | ICD-10-CM | POA: Diagnosis not present

## 2023-05-02 DIAGNOSIS — R410 Disorientation, unspecified: Secondary | ICD-10-CM | POA: Diagnosis not present

## 2023-05-02 DIAGNOSIS — N1832 Chronic kidney disease, stage 3b: Secondary | ICD-10-CM | POA: Diagnosis not present

## 2023-05-02 DIAGNOSIS — M6281 Muscle weakness (generalized): Secondary | ICD-10-CM | POA: Diagnosis not present

## 2023-05-02 DIAGNOSIS — I480 Paroxysmal atrial fibrillation: Secondary | ICD-10-CM | POA: Diagnosis not present

## 2023-05-02 DIAGNOSIS — E538 Deficiency of other specified B group vitamins: Secondary | ICD-10-CM | POA: Diagnosis not present

## 2023-05-02 DIAGNOSIS — R4181 Age-related cognitive decline: Secondary | ICD-10-CM | POA: Diagnosis not present

## 2023-05-02 DIAGNOSIS — K649 Unspecified hemorrhoids: Secondary | ICD-10-CM | POA: Diagnosis not present

## 2023-05-02 DIAGNOSIS — K219 Gastro-esophageal reflux disease without esophagitis: Secondary | ICD-10-CM | POA: Diagnosis not present

## 2023-05-02 DIAGNOSIS — I129 Hypertensive chronic kidney disease with stage 1 through stage 4 chronic kidney disease, or unspecified chronic kidney disease: Secondary | ICD-10-CM | POA: Diagnosis not present

## 2023-05-02 DIAGNOSIS — D631 Anemia in chronic kidney disease: Secondary | ICD-10-CM | POA: Diagnosis not present

## 2023-05-02 DIAGNOSIS — E559 Vitamin D deficiency, unspecified: Secondary | ICD-10-CM | POA: Diagnosis not present

## 2023-05-02 DIAGNOSIS — S72141D Displaced intertrochanteric fracture of right femur, subsequent encounter for closed fracture with routine healing: Secondary | ICD-10-CM | POA: Diagnosis not present

## 2023-05-02 DIAGNOSIS — D62 Acute posthemorrhagic anemia: Secondary | ICD-10-CM | POA: Diagnosis not present

## 2023-05-02 DIAGNOSIS — R739 Hyperglycemia, unspecified: Secondary | ICD-10-CM | POA: Diagnosis not present

## 2023-05-02 DIAGNOSIS — D696 Thrombocytopenia, unspecified: Secondary | ICD-10-CM | POA: Diagnosis not present

## 2023-05-09 DIAGNOSIS — E559 Vitamin D deficiency, unspecified: Secondary | ICD-10-CM | POA: Diagnosis not present

## 2023-05-09 DIAGNOSIS — R4181 Age-related cognitive decline: Secondary | ICD-10-CM | POA: Diagnosis not present

## 2023-05-09 DIAGNOSIS — E785 Hyperlipidemia, unspecified: Secondary | ICD-10-CM | POA: Diagnosis not present

## 2023-05-09 DIAGNOSIS — S72141D Displaced intertrochanteric fracture of right femur, subsequent encounter for closed fracture with routine healing: Secondary | ICD-10-CM | POA: Diagnosis not present

## 2023-05-09 DIAGNOSIS — M6281 Muscle weakness (generalized): Secondary | ICD-10-CM | POA: Diagnosis not present

## 2023-05-09 DIAGNOSIS — D631 Anemia in chronic kidney disease: Secondary | ICD-10-CM | POA: Diagnosis not present

## 2023-05-09 DIAGNOSIS — K649 Unspecified hemorrhoids: Secondary | ICD-10-CM | POA: Diagnosis not present

## 2023-05-09 DIAGNOSIS — R410 Disorientation, unspecified: Secondary | ICD-10-CM | POA: Diagnosis not present

## 2023-05-09 DIAGNOSIS — I129 Hypertensive chronic kidney disease with stage 1 through stage 4 chronic kidney disease, or unspecified chronic kidney disease: Secondary | ICD-10-CM | POA: Diagnosis not present

## 2023-05-09 DIAGNOSIS — R739 Hyperglycemia, unspecified: Secondary | ICD-10-CM | POA: Diagnosis not present

## 2023-05-09 DIAGNOSIS — D62 Acute posthemorrhagic anemia: Secondary | ICD-10-CM | POA: Diagnosis not present

## 2023-05-09 DIAGNOSIS — E538 Deficiency of other specified B group vitamins: Secondary | ICD-10-CM | POA: Diagnosis not present

## 2023-05-09 DIAGNOSIS — D696 Thrombocytopenia, unspecified: Secondary | ICD-10-CM | POA: Diagnosis not present

## 2023-05-09 DIAGNOSIS — I480 Paroxysmal atrial fibrillation: Secondary | ICD-10-CM | POA: Diagnosis not present

## 2023-05-09 DIAGNOSIS — N1832 Chronic kidney disease, stage 3b: Secondary | ICD-10-CM | POA: Diagnosis not present

## 2023-05-09 DIAGNOSIS — K219 Gastro-esophageal reflux disease without esophagitis: Secondary | ICD-10-CM | POA: Diagnosis not present

## 2023-05-11 ENCOUNTER — Ambulatory Visit: Payer: Medicare Other | Admitting: Orthopedic Surgery

## 2023-05-11 ENCOUNTER — Other Ambulatory Visit (INDEPENDENT_AMBULATORY_CARE_PROVIDER_SITE_OTHER): Payer: Medicare Other

## 2023-05-11 DIAGNOSIS — S72141A Displaced intertrochanteric fracture of right femur, initial encounter for closed fracture: Secondary | ICD-10-CM | POA: Diagnosis not present

## 2023-05-11 NOTE — Progress Notes (Signed)
Orthopedic Surgery Post-operative Office Visit   Procedure: right hip cephalomedullary rodding Date of Surgery: 03/17/2023 (~7 weeks post-op)   Assessment: Patient is a 89 y.o. who is doing well after right hip fracture cephalomedullary rod fixation     Plan: -Operative plans complete -Okay to submerge wound -Weight bearing as tolerated, encouraged ambulation with walker -Continue to work with PT and do home exercises -Pain management: tylenol -Return to office in 6 weeks, x-rays needed at next visit: AP/lateral right femur   ___________________________________________________________________________     Subjective: Patient has been doing well since she was last seen.  She is at home with her son.  She has been ambulating with a walker.  She is doing home health PT.  She is not having any hip pain.  She does not have any pain in her hip when walking either.  Has not noticed any redness or drainage around her incisions.   Objective:   General: no acute distress, appropriate affect Neurologic: alert, answering questions appropriately, following commands Respiratory: unlabored breathing on room air Skin: incisions are well healed with no erythema, induration, active/expressible drainage   MSK: -RLE: EHL/TA/GSC intact, SILT in s/s/dp/sp/t nerve distributions, foot warm and well perfused, no pain with log roll   Imaging: X-rays of the right femur taken 05/11/2023 were independently reviewed and interpreted, showing cephalomedullary rod in appropriate position. Fracture alignment maintained. No lucency around the screws. Lag screws appear in good position with no cut out seen. No new fractures seen.      Patient name: Marialy Urbanczyk [place] Patient MRN: [id number] Date of visit: 05/11/23

## 2023-05-16 DIAGNOSIS — I129 Hypertensive chronic kidney disease with stage 1 through stage 4 chronic kidney disease, or unspecified chronic kidney disease: Secondary | ICD-10-CM | POA: Diagnosis not present

## 2023-05-16 DIAGNOSIS — M6281 Muscle weakness (generalized): Secondary | ICD-10-CM | POA: Diagnosis not present

## 2023-05-16 DIAGNOSIS — R410 Disorientation, unspecified: Secondary | ICD-10-CM | POA: Diagnosis not present

## 2023-05-16 DIAGNOSIS — K219 Gastro-esophageal reflux disease without esophagitis: Secondary | ICD-10-CM | POA: Diagnosis not present

## 2023-05-16 DIAGNOSIS — K649 Unspecified hemorrhoids: Secondary | ICD-10-CM | POA: Diagnosis not present

## 2023-05-16 DIAGNOSIS — I480 Paroxysmal atrial fibrillation: Secondary | ICD-10-CM | POA: Diagnosis not present

## 2023-05-16 DIAGNOSIS — D696 Thrombocytopenia, unspecified: Secondary | ICD-10-CM | POA: Diagnosis not present

## 2023-05-16 DIAGNOSIS — N1832 Chronic kidney disease, stage 3b: Secondary | ICD-10-CM | POA: Diagnosis not present

## 2023-05-16 DIAGNOSIS — R4181 Age-related cognitive decline: Secondary | ICD-10-CM | POA: Diagnosis not present

## 2023-05-16 DIAGNOSIS — R739 Hyperglycemia, unspecified: Secondary | ICD-10-CM | POA: Diagnosis not present

## 2023-05-16 DIAGNOSIS — E538 Deficiency of other specified B group vitamins: Secondary | ICD-10-CM | POA: Diagnosis not present

## 2023-05-16 DIAGNOSIS — E785 Hyperlipidemia, unspecified: Secondary | ICD-10-CM | POA: Diagnosis not present

## 2023-05-16 DIAGNOSIS — D62 Acute posthemorrhagic anemia: Secondary | ICD-10-CM | POA: Diagnosis not present

## 2023-05-16 DIAGNOSIS — E559 Vitamin D deficiency, unspecified: Secondary | ICD-10-CM | POA: Diagnosis not present

## 2023-05-16 DIAGNOSIS — S72141D Displaced intertrochanteric fracture of right femur, subsequent encounter for closed fracture with routine healing: Secondary | ICD-10-CM | POA: Diagnosis not present

## 2023-05-16 DIAGNOSIS — D631 Anemia in chronic kidney disease: Secondary | ICD-10-CM | POA: Diagnosis not present

## 2023-05-21 DIAGNOSIS — E559 Vitamin D deficiency, unspecified: Secondary | ICD-10-CM | POA: Diagnosis not present

## 2023-05-21 DIAGNOSIS — D696 Thrombocytopenia, unspecified: Secondary | ICD-10-CM | POA: Diagnosis not present

## 2023-05-21 DIAGNOSIS — S72141D Displaced intertrochanteric fracture of right femur, subsequent encounter for closed fracture with routine healing: Secondary | ICD-10-CM | POA: Diagnosis not present

## 2023-05-21 DIAGNOSIS — R4181 Age-related cognitive decline: Secondary | ICD-10-CM | POA: Diagnosis not present

## 2023-05-21 DIAGNOSIS — N1832 Chronic kidney disease, stage 3b: Secondary | ICD-10-CM | POA: Diagnosis not present

## 2023-05-21 DIAGNOSIS — D631 Anemia in chronic kidney disease: Secondary | ICD-10-CM | POA: Diagnosis not present

## 2023-05-21 DIAGNOSIS — R410 Disorientation, unspecified: Secondary | ICD-10-CM | POA: Diagnosis not present

## 2023-05-21 DIAGNOSIS — M6281 Muscle weakness (generalized): Secondary | ICD-10-CM | POA: Diagnosis not present

## 2023-05-21 DIAGNOSIS — I129 Hypertensive chronic kidney disease with stage 1 through stage 4 chronic kidney disease, or unspecified chronic kidney disease: Secondary | ICD-10-CM | POA: Diagnosis not present

## 2023-05-21 DIAGNOSIS — K649 Unspecified hemorrhoids: Secondary | ICD-10-CM | POA: Diagnosis not present

## 2023-05-21 DIAGNOSIS — E538 Deficiency of other specified B group vitamins: Secondary | ICD-10-CM | POA: Diagnosis not present

## 2023-05-21 DIAGNOSIS — I480 Paroxysmal atrial fibrillation: Secondary | ICD-10-CM | POA: Diagnosis not present

## 2023-05-21 DIAGNOSIS — E785 Hyperlipidemia, unspecified: Secondary | ICD-10-CM | POA: Diagnosis not present

## 2023-05-21 DIAGNOSIS — D62 Acute posthemorrhagic anemia: Secondary | ICD-10-CM | POA: Diagnosis not present

## 2023-05-21 DIAGNOSIS — K219 Gastro-esophageal reflux disease without esophagitis: Secondary | ICD-10-CM | POA: Diagnosis not present

## 2023-05-21 DIAGNOSIS — R739 Hyperglycemia, unspecified: Secondary | ICD-10-CM | POA: Diagnosis not present

## 2023-06-01 DIAGNOSIS — S72001D Fracture of unspecified part of neck of right femur, subsequent encounter for closed fracture with routine healing: Secondary | ICD-10-CM | POA: Diagnosis not present

## 2023-06-01 DIAGNOSIS — R278 Other lack of coordination: Secondary | ICD-10-CM | POA: Diagnosis not present

## 2023-06-01 DIAGNOSIS — R262 Difficulty in walking, not elsewhere classified: Secondary | ICD-10-CM | POA: Diagnosis not present

## 2023-06-01 DIAGNOSIS — M6281 Muscle weakness (generalized): Secondary | ICD-10-CM | POA: Diagnosis not present

## 2023-06-05 ENCOUNTER — Encounter: Payer: Self-pay | Admitting: Nurse Practitioner

## 2023-06-27 ENCOUNTER — Ambulatory Visit (INDEPENDENT_AMBULATORY_CARE_PROVIDER_SITE_OTHER): Payer: Medicare Other

## 2023-06-27 VITALS — Ht 62.0 in | Wt 128.0 lb

## 2023-06-27 DIAGNOSIS — Z0000 Encounter for general adult medical examination without abnormal findings: Secondary | ICD-10-CM

## 2023-06-27 NOTE — Progress Notes (Signed)
Subjective:   Christine Middleton is a 89 y.o. female who presents for Medicare Annual (Subsequent) preventive examination.  Visit Complete: Virtual I connected with  Christine Middleton on 06/27/23 by a audio enabled telemedicine application and verified that I am speaking with the correct person using two identifiers.  Patient Location: Home  Provider Location: Home Office  I discussed the limitations of evaluation and management by telemedicine. The patient expressed understanding and agreed to proceed.  Vital Signs: Because this visit was a virtual/telehealth visit, some criteria may be missing or patient reported. Any vitals not documented were not able to be obtained and vitals that have been documented are patient reported.    Cardiac Risk Factors include: advanced age (>33men, >32 women);hypertension     Objective:    Today's Vitals   06/27/23 1120  Weight: 128 lb (58.1 kg)  Height: 5\' 2"  (1.575 m)   Body mass index is 23.41 kg/m.     06/27/2023   11:30 AM 03/26/2023   12:47 PM 12/19/2022    7:08 PM 05/03/2022    5:13 PM 02/16/2022   10:43 AM 02/14/2021    3:37 PM 01/17/2021    7:07 PM  Advanced Directives  Does Patient Have a Medical Advance Directive? Yes Yes No Yes Yes Yes Yes  Type of Estate agent of [place];Living will Out of facility DNR (pink MOST or yellow form)  Out of facility DNR (pink MOST or yellow form) Healthcare Power of [place];Living will Healthcare Power of [place];Living will Healthcare Power of [place];Living will  Does patient want to make changes to medical advance directive? No - Patient declined   No - Patient declined No - Patient declined  No - Patient declined  Copy of Healthcare Power of Attorney in Chart? Yes - validated most recent copy scanned in chart (See row information)    Yes - validated most recent copy scanned in chart (See row information) No - copy requested     Current Medications (verified) Outpatient  Encounter Medications as of 06/27/2023  Medication Sig   atenolol (TENORMIN) 50 MG tablet TAKE 1 TABLET BY MOUTH EVERY DAY   cholecalciferol (VITAMIN D3) 25 MCG (1000 UNIT) tablet Take 1,000 Units by mouth every evening.   docusate sodium (COLACE) 100 MG capsule Take 2 capsules (200 mg total) by mouth 2 (two) times daily.   Iron, Ferrous Sulfate, 325 (65 Fe) MG TABS Take 325 mg by mouth daily.   lactose free nutrition (BOOST) LIQD Take 237 mLs by mouth 3 (three) times daily between meals.   losartan (COZAAR) 25 MG tablet Take 1 tablet (25 mg total) by mouth every evening.   mirtazapine (REMERON) 15 MG tablet Take 1/2 (7.5 mg) tablet by mouth at bedtime (Patient taking differently: Take 7.5 mg by mouth every evening.)   pantoprazole (PROTONIX) 40 MG tablet TAKE 1 TABLET BY MOUTH EVERY DAY   No facility-administered encounter medications on file as of 06/27/2023.    Allergies (verified) Codeine, Lipitor [atorvastatin], Macrobid [nitrofurantoin], Zestril [lisinopril], Amoxil [amoxicillin], and Ultram [tramadol]   History: Past Medical History:  Diagnosis Date   Blood transfusion without reported diagnosis    Diverticulitis    Esophageal stricture    GERD (gastroesophageal reflux disease)    Hyperlipidemia    Hypertension    Shingles    Stroke (HCC)    Vitamin B 12 deficiency    Past Surgical History:  Procedure Laterality Date   ABDOMINAL HYSTERECTOMY     APPENDECTOMY  COLONOSCOPY  12-09-08   per Dr. Jarold Motto (incomplete along with barium enema 12-10-08) with hemorrhoids  and diverticulae   COLONOSCOPY WITH PROPOFOL N/A 07/10/2019   Procedure: COLONOSCOPY WITH PROPOFOL;  Surgeon: Meridee Score Netty Starring., MD;  Location: [facility];  Service: Gastroenterology;  Laterality: N/A;   ESOPHAGOGASTRODUODENOSCOPY  09-2004   per Dr. Jarold Motto with dilatation    EYE SURGERY     HEMOSTASIS CLIP PLACEMENT  07/10/2019   Procedure: HEMOSTASIS CLIP PLACEMENT;  Surgeon: Lemar Lofty., MD;  Location: [facility];  Service: Gastroenterology;;   INTRAMEDULLARY (IM) NAIL INTERTROCHANTERIC Right 03/17/2023   Procedure: INTRAMEDULLARY (IM) NAIL INTERTROCHANTERIC;  Surgeon: London Sheer, MD;  Location: [facility] OR;  Service: Orthopedics;  Laterality: Right;   POLYPECTOMY  07/10/2019   Procedure: POLYPECTOMY;  Surgeon: Mansouraty, Netty Starring., MD;  Location: [facility];  Service: Gastroenterology;;   TONSILLECTOMY     Family History  Problem Relation Age of Onset   Heart attack Mother    Heart disease Father    Stroke Sister    Cancer Brother    Cancer Brother    Diabetes Brother    Breast cancer Other    Diabetes Other    Hypertension Other    Stroke Other    Heart disease Other    Colon cancer Neg Hx    Stomach cancer Neg Hx    Esophageal cancer Neg Hx    Social History   Socioeconomic History   Marital status: Widowed    Spouse name: Elmon Else. Lippy   Number of children: 4   Years of education: HS   Highest education level: 12th grade  Occupational History   Occupation: Retired    Associate Professor: RETIRED   Occupation: retired  Tobacco Use   Smoking status: Never   Smokeless tobacco: Never  Vaping Use   Vaping status: Never Used  Substance and Sexual Activity   Alcohol use: No    Alcohol/week: 0.0 standard drinks of alcohol   Drug use: No   Sexual activity: Not on file  Other Topics Concern   Not on file  Social History Narrative   Patient lives at home with family.   Patient is right handed.   Patient has a high school education.   Caffeine Use: 1 cup daily   Social Determinants of Health   Financial Resource Strain: Low Risk  (06/27/2023)   Overall Financial Resource Strain (CARDIA)    Difficulty of Paying Living Expenses: Not hard at all  Food Insecurity: No Food Insecurity (06/27/2023)   Hunger Vital Sign    Worried About Running Out of Food in the Last Year: Never true    Ran Out of Food in the Last Year: Never true  Transportation  Needs: No Transportation Needs (06/27/2023)   PRAPARE - Administrator, Civil Service (Medical): No    Lack of Transportation (Non-Medical): No  Physical Activity: Insufficiently Active (06/27/2023)   Exercise Vital Sign    Days of Exercise per Week: 7 days    Minutes of Exercise per Session: 20 min  Stress: No Stress Concern Present (06/27/2023)   [employer] of Occupational Health - Occupational Stress Questionnaire    Feeling of Stress : Not at all  Social Connections: Moderately Integrated (06/27/2023)   Social Connection and Isolation Panel [NHANES]    Frequency of Communication with Friends and Family: More than three times a week    Frequency of Social Gatherings with Friends and Family: More than three times  a week    Attends Religious Services: More than 4 times per year    Active Member of Clubs or Organizations: Yes    Attends Banker Meetings: More than 4 times per year    Marital Status: Widowed    Tobacco Counseling Counseling given: Not Answered   Clinical Intake:  Pre-visit preparation completed: Yes  Pain : No/denies pain     BMI - recorded: 23.41 Nutritional Status: BMI of 19-24  Normal Nutritional Risks: None Diabetes: No  How often do you need to have someone help you when you read instructions, pamphlets, or other written materials from your doctor or pharmacy?: 1 - Never  Interpreter Needed?: No  Information entered by :: Theresa Mulligan LPN   Activities of Daily Living    06/27/2023   11:27 AM  In your present state of health, do you have any difficulty performing the following activities:  Hearing? 1  Comment Wears hearing aids  Vision? 0  Difficulty concentrating or making decisions? 0  Walking or climbing stairs? 1  Comment Uses Walker and [employer] or bathing? 0  Doing errands, shopping? 0  Preparing Food and eating ? N  Using the Toilet? N  In the past six months, have you accidently leaked  urine? N  Do you have problems with loss of bowel control? N  Managing your Medications? N  Managing your Finances? N  Housekeeping or managing your Housekeeping? N    Patient Care Team: Nelwyn Salisbury, MD as PCP - General [place], Peter M, MD as PCP - Cardiology (Cardiology) Verner Chol, [facility] (Inactive) as Pharmacist (Pharmacist)  Indicate any recent Medical Services you may have received from other than [facility] providers in the past year (date may be approximate).     Assessment:   This is a routine wellness examination for Christine Middleton.  Hearing/Vision screen Hearing Screening - Comments:: Wears hearing aids Vision Screening - Comments:: Wears rx glasses - up to date with routine eye exams with  Dr Dione Booze   Goals Addressed               This Visit's Progress     No current goals (pt-stated)         Depression Screen    06/27/2023   11:25 AM 04/16/2023    2:02 PM 01/05/2023   11:18 AM 02/16/2022   10:38 AM 02/07/2022   10:48 AM 02/14/2021    3:39 PM 02/14/2021    3:31 PM  PHQ 2/9 Scores  PHQ - 2 Score 0 0 0 0 1 0 0  PHQ- 9 Score  0 0 0 7      Fall Risk    06/27/2023   11:28 AM 04/16/2023    1:59 PM 01/05/2023   11:17 AM 05/09/2022    1:20 PM 02/16/2022   10:43 AM  Fall Risk   Falls in the past year? 1 1 0 1 0  Number falls in past yr: 0 1 0 0 0  Injury with Fall? 1 1 0 1 0  Comment Fx: Rt Leg, Followed by medical attention Hip  injury     Risk for fall due to : No Fall Risks History of fall(s) No Fall Risks Impaired balance/gait No Fall Risks  Follow up Falls prevention discussed Falls evaluation completed Falls evaluation completed Falls evaluation completed;Falls prevention discussed;Follow up appointment     MEDICARE RISK AT HOME: Medicare Risk at Home Any stairs in or around the home?: Yes  If so, are there any without handrails?: No Home free of loose throw rugs in walkways, pet beds, electrical cords, etc?: Yes Adequate lighting in your home to reduce risk of  falls?: Yes Life alert?: Yes Use of a cane, walker or w/c?: Yes Grab bars in the bathroom?: Yes Shower chair or bench in shower?: Yes Elevated toilet seat or a handicapped toilet?: Yes  TIMED UP AND GO:  Was the test performed?  No    Cognitive Function:        06/27/2023   11:30 AM 02/16/2022   10:44 AM  6CIT Screen  What Year? 0 points 0 points  What month? 0 points 0 points  What time? 0 points 0 points  Count back from 20 2 points 0 points  Months in reverse 2 points 0 points  Repeat phrase 2 points 2 points  Total Score 6 points 2 points    Immunizations Immunization History  Administered Date(s) Administered   Fluad Quad(high Dose 65+) 07/02/2019, 06/01/2021, 05/09/2022   Influenza Split 07/03/2012   Influenza Whole 04/30/2006, 05/30/2007, 04/07/2010   Influenza, High Dose Seasonal PF 05/19/2015, 05/02/2017, 04/17/2018   Influenza,inj,Quad PF,6+ Mos 05/09/2013, 07/28/2014   Influenza-Unspecified 05/26/2016, 05/02/2017   PFIZER(Purple Top)SARS-COV-2 Vaccination 12/31/2019, 01/21/2020   Pneumococcal Conjugate-13 08/05/2015   Pneumococcal Polysaccharide-23 07/31/2000, 05/30/2007   Tdap 10/02/2018    TDAP status: Up to date  Flu Vaccine status: Due, Education has been provided regarding the importance of this vaccine. Advised may receive this vaccine at local pharmacy or Health Dept. Aware to provide a copy of the vaccination record if obtained from local pharmacy or Health Dept. Verbalized acceptance and understanding.  Pneumococcal vaccine status: Up to date  Covid-19 vaccine status: Declined, Education has been provided regarding the importance of this vaccine but patient still declined. Advised may receive this vaccine at local pharmacy or Health Dept.or vaccine clinic. Aware to provide a copy of the vaccination record if obtained from local pharmacy or Health Dept. Verbalized acceptance and understanding.  Qualifies for Shingles Vaccine? Yes   Zostavax  completed No   Shingrix Completed?: No.    Education has been provided regarding the importance of this vaccine. Patient has been advised to call insurance company to determine out of pocket expense if they have not yet received this vaccine. Advised may also receive vaccine at local pharmacy or Health Dept. Verbalized acceptance and understanding.  Screening Tests Health Maintenance  Topic Date Due   DEXA SCAN  Never done   INFLUENZA VACCINE  03/01/2023   COVID-19 Vaccine (3 - 2023-24 season) 04/01/2023   Zoster Vaccines- Shingrix (1 of 2) 11/28/2023 (Originally 07/31/1972)   Medicare Annual Wellness (AWV)  06/26/2024   DTaP/Tdap/Td (2 - Td or Tdap) 10/01/2028   Pneumonia Vaccine 37+ Years old  Completed   HPV VACCINES  Aged Out    Health Maintenance  Health Maintenance Due  Topic Date Due   DEXA SCAN  Never done   INFLUENZA VACCINE  03/01/2023   COVID-19 Vaccine (3 - 2023-24 season) 04/01/2023        Bone Density status: Ordered Deferred. Pt provided with contact info and advised to call to schedule appt.    Additional Screening:    Vision Screening: Recommended annual ophthalmology exams for early detection of glaucoma and other disorders of the eye. Is the patient up to date with their annual eye exam?  Yes  Who is the provider or what is the name of the office in which the  patient attends annual eye exams? Dr Dione Booze If pt is not established with a provider, would they like to be referred to a provider to establish care? No .   Dental Screening: Recommended annual dental exams for proper oral hygiene    Community Resource Referral / Chronic Care Management: CRR required this visit?  No   CCM required this visit?  No     Plan:     I have personally reviewed and noted the following in the patient's chart:   Medical and social history Use of alcohol, tobacco or illicit drugs  Current medications and supplements including opioid prescriptions. Patient is not  currently taking opioid prescriptions. Functional ability and status Nutritional status Physical activity Advanced directives List of other physicians Hospitalizations, surgeries, and ER visits in previous 12 months Vitals Screenings to include cognitive, depression, and falls Referrals and appointments  In addition, I have reviewed and discussed with patient certain preventive protocols, quality metrics, and best practice recommendations. A written personalized care plan for preventive services as well as general preventive health recommendations were provided to patient.     Tillie Rung, LPN   [id number]/05/[id number]   After Visit Summary: (MyChart) Due to this being a telephonic visit, the after visit summary with patients personalized plan was offered to patient via MyChart   Nurse Notes: None

## 2023-06-27 NOTE — Patient Instructions (Addendum)
Ms. Northway , Thank you for taking time to come for your Medicare Wellness Visit. I appreciate your ongoing commitment to your health goals. Please review the following plan we discussed and let me know if I can assist you in the future.   Referrals/Orders/Follow-Ups/Clinician Recommendations:   This is a list of the screening recommended for you and due dates:  Health Maintenance  Topic Date Due   DEXA scan (bone density measurement)  Never done   Flu Shot  03/01/2023   COVID-19 Vaccine (3 - 2023-24 season) 04/01/2023   Zoster (Shingles) Vaccine (1 of 2) 11/28/2023*   Medicare Annual Wellness Visit  06/26/2024   DTaP/Tdap/Td vaccine (2 - Td or Tdap) 10/01/2028   Pneumonia Vaccine  Completed   HPV Vaccine  Aged Out  *Topic was postponed. The date shown is not the original due date.    Advanced directives: (In Chart) A copy of your advanced directives are scanned into your chart should your provider ever need it.  Next Medicare Annual Wellness Visit scheduled for next year: Yes

## 2023-07-05 ENCOUNTER — Ambulatory Visit: Payer: Medicare Other | Admitting: Orthopedic Surgery

## 2023-07-05 ENCOUNTER — Other Ambulatory Visit (INDEPENDENT_AMBULATORY_CARE_PROVIDER_SITE_OTHER): Payer: Medicare Other

## 2023-07-05 DIAGNOSIS — S72141A Displaced intertrochanteric fracture of right femur, initial encounter for closed fracture: Secondary | ICD-10-CM | POA: Diagnosis not present

## 2023-07-05 NOTE — Progress Notes (Signed)
Orthopedic Surgery Post-operative Office Visit   Procedure: right hip cephalomedullary rodding Date of Surgery: 03/17/2023 (~3 months post-op)   Assessment: Patient is a 89 y.o. who is doing well after right hip fracture cephalomedullary rod fixation     Plan: -Operative plans complete -No specific precautions, activity as tolerated -Pain management: tylenol -Return to office in 3 months, x-rays needed at next visit: AP/lateral right femur   ___________________________________________________________________________     Subjective: Patient is doing well since surgery.  She is ambulating with a walker.  She has hip pain if she walks for a long distance but no hip pain when walking around the house.  She is satisfied with her surgical outcome thus far.   Objective:   General: no acute distress, appropriate affect Neurologic: alert, answering questions appropriately, following commands Respiratory: unlabored breathing on room air Skin: incisions are well healed   MSK: -RLE: EHL/TA/GSC intact, SILT in s/s/dp/sp/t nerve distributions, foot warm and well perfused, no pain with log roll   Imaging: X-rays of the right femur taken 07/05/2023 were independently reviewed and interpreted, showing cephalomedullary rod in appropriate position.  Fracture is reduced.  Lag screws are well centered within the femoral head.  No lucency seen around the screws.  No new fracture noted.     Patient name: Christine Middleton Patient MRN: [id number] Date of visit: 07/05/23

## 2023-07-11 DIAGNOSIS — R262 Difficulty in walking, not elsewhere classified: Secondary | ICD-10-CM | POA: Diagnosis not present

## 2023-07-11 DIAGNOSIS — M6281 Muscle weakness (generalized): Secondary | ICD-10-CM | POA: Diagnosis not present

## 2023-07-11 DIAGNOSIS — S72001D Fracture of unspecified part of neck of right femur, subsequent encounter for closed fracture with routine healing: Secondary | ICD-10-CM | POA: Diagnosis not present

## 2023-07-11 DIAGNOSIS — R278 Other lack of coordination: Secondary | ICD-10-CM | POA: Diagnosis not present

## 2023-07-23 ENCOUNTER — Emergency Department (HOSPITAL_COMMUNITY): Payer: Medicare Other

## 2023-07-23 ENCOUNTER — Inpatient Hospital Stay (HOSPITAL_COMMUNITY)
Admission: EM | Admit: 2023-07-23 | Discharge: 2023-07-28 | DRG: 445 | Disposition: A | Discharge status: Home Health | Payer: Medicare Other | Attending: Internal Medicine | Admitting: Internal Medicine

## 2023-07-23 ENCOUNTER — Encounter (HOSPITAL_COMMUNITY): Payer: Self-pay

## 2023-07-23 ENCOUNTER — Other Ambulatory Visit: Payer: Self-pay

## 2023-07-23 DIAGNOSIS — Z8249 Family history of ischemic heart disease and other diseases of the circulatory system: Secondary | ICD-10-CM

## 2023-07-23 DIAGNOSIS — Z79899 Other long term (current) drug therapy: Secondary | ICD-10-CM

## 2023-07-23 DIAGNOSIS — K810 Acute cholecystitis: Principal | ICD-10-CM | POA: Diagnosis present

## 2023-07-23 DIAGNOSIS — R945 Abnormal results of liver function studies: Secondary | ICD-10-CM | POA: Diagnosis not present

## 2023-07-23 DIAGNOSIS — Z823 Family history of stroke: Secondary | ICD-10-CM

## 2023-07-23 DIAGNOSIS — I10 Essential (primary) hypertension: Secondary | ICD-10-CM | POA: Diagnosis present

## 2023-07-23 DIAGNOSIS — Z881 Allergy status to other antibiotic agents status: Secondary | ICD-10-CM | POA: Diagnosis not present

## 2023-07-23 DIAGNOSIS — R1111 Vomiting without nausea: Secondary | ICD-10-CM | POA: Diagnosis not present

## 2023-07-23 DIAGNOSIS — Z23 Encounter for immunization: Secondary | ICD-10-CM

## 2023-07-23 DIAGNOSIS — F32A Depression, unspecified: Secondary | ICD-10-CM | POA: Diagnosis not present

## 2023-07-23 DIAGNOSIS — Z885 Allergy status to narcotic agent status: Secondary | ICD-10-CM

## 2023-07-23 DIAGNOSIS — K219 Gastro-esophageal reflux disease without esophagitis: Secondary | ICD-10-CM | POA: Diagnosis present

## 2023-07-23 DIAGNOSIS — R1011 Right upper quadrant pain: Principal | ICD-10-CM

## 2023-07-23 DIAGNOSIS — Z515 Encounter for palliative care: Secondary | ICD-10-CM

## 2023-07-23 DIAGNOSIS — G4700 Insomnia, unspecified: Secondary | ICD-10-CM | POA: Diagnosis present

## 2023-07-23 DIAGNOSIS — K625 Hemorrhage of anus and rectum: Secondary | ICD-10-CM | POA: Diagnosis not present

## 2023-07-23 DIAGNOSIS — K649 Unspecified hemorrhoids: Secondary | ICD-10-CM | POA: Diagnosis present

## 2023-07-23 DIAGNOSIS — Z8673 Personal history of transient ischemic attack (TIA), and cerebral infarction without residual deficits: Secondary | ICD-10-CM | POA: Diagnosis not present

## 2023-07-23 DIAGNOSIS — D649 Anemia, unspecified: Secondary | ICD-10-CM | POA: Diagnosis present

## 2023-07-23 DIAGNOSIS — K819 Cholecystitis, unspecified: Secondary | ICD-10-CM | POA: Diagnosis not present

## 2023-07-23 DIAGNOSIS — Z888 Allergy status to other drugs, medicaments and biological substances status: Secondary | ICD-10-CM | POA: Diagnosis not present

## 2023-07-23 DIAGNOSIS — E871 Hypo-osmolality and hyponatremia: Secondary | ICD-10-CM | POA: Diagnosis not present

## 2023-07-23 DIAGNOSIS — K921 Melena: Secondary | ICD-10-CM | POA: Diagnosis not present

## 2023-07-23 DIAGNOSIS — K8020 Calculus of gallbladder without cholecystitis without obstruction: Secondary | ICD-10-CM | POA: Diagnosis not present

## 2023-07-23 DIAGNOSIS — I480 Paroxysmal atrial fibrillation: Secondary | ICD-10-CM | POA: Diagnosis not present

## 2023-07-23 DIAGNOSIS — E876 Hypokalemia: Secondary | ICD-10-CM | POA: Diagnosis not present

## 2023-07-23 DIAGNOSIS — Z809 Family history of malignant neoplasm, unspecified: Secondary | ICD-10-CM

## 2023-07-23 DIAGNOSIS — Z803 Family history of malignant neoplasm of breast: Secondary | ICD-10-CM

## 2023-07-23 DIAGNOSIS — Z66 Do not resuscitate: Secondary | ICD-10-CM | POA: Diagnosis not present

## 2023-07-23 DIAGNOSIS — K449 Diaphragmatic hernia without obstruction or gangrene: Secondary | ICD-10-CM | POA: Diagnosis not present

## 2023-07-23 DIAGNOSIS — K828 Other specified diseases of gallbladder: Secondary | ICD-10-CM | POA: Diagnosis not present

## 2023-07-23 DIAGNOSIS — R7989 Other specified abnormal findings of blood chemistry: Secondary | ICD-10-CM

## 2023-07-23 DIAGNOSIS — K5730 Diverticulosis of large intestine without perforation or abscess without bleeding: Secondary | ICD-10-CM | POA: Diagnosis not present

## 2023-07-23 DIAGNOSIS — R1084 Generalized abdominal pain: Secondary | ICD-10-CM | POA: Diagnosis not present

## 2023-07-23 DIAGNOSIS — E785 Hyperlipidemia, unspecified: Secondary | ICD-10-CM | POA: Diagnosis present

## 2023-07-23 DIAGNOSIS — Z880 Allergy status to penicillin: Secondary | ICD-10-CM

## 2023-07-23 DIAGNOSIS — R160 Hepatomegaly, not elsewhere classified: Secondary | ICD-10-CM | POA: Diagnosis not present

## 2023-07-23 DIAGNOSIS — Z833 Family history of diabetes mellitus: Secondary | ICD-10-CM

## 2023-07-23 LAB — COMPREHENSIVE METABOLIC PANEL
ALT: 107 U/L — ABNORMAL HIGH (ref 0–44)
AST: 265 U/L — ABNORMAL HIGH (ref 15–41)
Albumin: 2.9 g/dL — ABNORMAL LOW (ref 3.5–5.0)
Alkaline Phosphatase: 213 U/L — ABNORMAL HIGH (ref 38–126)
Anion gap: 12 (ref 5–15)
BUN: 11 mg/dL (ref 8–23)
CO2: 23 mmol/L (ref 22–32)
Calcium: 8.7 mg/dL — ABNORMAL LOW (ref 8.9–10.3)
Chloride: 101 mmol/L (ref 98–111)
Creatinine, Ser: 1.06 mg/dL — ABNORMAL HIGH (ref 0.44–1.00)
GFR, Estimated: 47 mL/min — ABNORMAL LOW (ref >60)
Glucose, Bld: 119 mg/dL — ABNORMAL HIGH (ref 70–99)
Potassium: 3.4 mmol/L — ABNORMAL LOW (ref 3.5–5.1)
Sodium: 136 mmol/L (ref 135–145)
Total Bilirubin: 1.6 mg/dL — ABNORMAL HIGH (ref <1.2)
Total Protein: 6.3 g/dL — ABNORMAL LOW (ref 6.5–8.1)

## 2023-07-23 LAB — CBC WITH DIFFERENTIAL/PLATELET
Abs Immature Granulocytes: 0.07 10*3/uL (ref 0.00–0.07)
Basophils Absolute: 0 10*3/uL (ref 0.0–0.1)
Basophils Relative: 0 %
Eosinophils Absolute: 0 10*3/uL (ref 0.0–0.5)
Eosinophils Relative: 0 %
HCT: 37.6 % (ref 36.0–46.0)
Hemoglobin: 12.2 g/dL (ref 12.0–15.0)
Immature Granulocytes: 1 %
Lymphocytes Relative: 2 %
Lymphs Abs: 0.2 10*3/uL — ABNORMAL LOW (ref 0.7–4.0)
MCH: 29.8 pg (ref 26.0–34.0)
MCHC: 32.4 g/dL (ref 30.0–36.0)
MCV: 91.7 fL (ref 80.0–100.0)
Monocytes Absolute: 0.5 10*3/uL (ref 0.1–1.0)
Monocytes Relative: 4 %
Neutro Abs: 10.9 10*3/uL — ABNORMAL HIGH (ref 1.7–7.7)
Neutrophils Relative %: 93 %
Platelets: 190 10*3/uL (ref 150–400)
RBC: 4.1 MIL/uL (ref 3.87–5.11)
RDW: 12.3 % (ref 11.5–15.5)
WBC: 11.7 10*3/uL — ABNORMAL HIGH (ref 4.0–10.5)
nRBC: 0 % (ref 0.0–0.2)

## 2023-07-23 LAB — LACTIC ACID, PLASMA
Lactic Acid, Venous: 3.2 mmol/L (ref 0.5–1.9)
Lactic Acid, Venous: 3.2 mmol/L (ref 0.5–1.9)
Lactic Acid, Venous: 2.1 mmol/L (ref 0.5–1.9)
Lactic Acid, Venous: 3.3 mmol/L (ref 0.5–1.9)

## 2023-07-23 LAB — MAGNESIUM: Magnesium: 1.4 mg/dL — ABNORMAL LOW (ref 1.7–2.4)

## 2023-07-23 LAB — VITAMIN D 25 HYDROXY (VIT D DEFICIENCY, FRACTURES): Vit D, 25-Hydroxy: 56.46 ng/mL (ref 30–100)

## 2023-07-23 LAB — TROPONIN I (HIGH SENSITIVITY)
Troponin I (High Sensitivity): 16 ng/L (ref <18)
Troponin I (High Sensitivity): 19 ng/L — ABNORMAL HIGH (ref <18)

## 2023-07-23 LAB — PHOSPHORUS: Phosphorus: 1.9 mg/dL — ABNORMAL LOW (ref 2.5–4.6)

## 2023-07-23 LAB — LIPASE, BLOOD: Lipase: 38 U/L (ref 11–51)

## 2023-07-23 LAB — VITAMIN B12: Vitamin B-12: 1251 pg/mL — ABNORMAL HIGH (ref 180–914)

## 2023-07-23 LAB — TSH: TSH: 0.887 u[IU]/mL (ref 0.350–4.500)

## 2023-07-23 LAB — POC OCCULT BLOOD, ED: Fecal Occult Bld: NEGATIVE

## 2023-07-23 MED ORDER — POTASSIUM PHOSPHATES 15 MMOLE/5ML IV SOLN
30.0000 mmol | Freq: Once | INTRAVENOUS | Status: AC
  Administered 2023-07-23: 30 mmol via INTRAVENOUS
  Filled 2023-07-23: qty 10

## 2023-07-23 MED ORDER — DOCUSATE SODIUM 100 MG PO CAPS
200.0000 mg | ORAL_CAPSULE | Freq: Two times a day (BID) | ORAL | Status: DC
  Administered 2023-07-23 – 2023-07-28 (×9): 200 mg via ORAL
  Filled 2023-07-23 (×10): qty 2

## 2023-07-23 MED ORDER — INFLUENZA VAC A&B SURF ANT ADJ 0.5 ML IM SUSY
0.5000 mL | PREFILLED_SYRINGE | INTRAMUSCULAR | Status: AC
  Administered 2023-07-24: 0.5 mL via INTRAMUSCULAR
  Filled 2023-07-23: qty 0.5

## 2023-07-23 MED ORDER — ENOXAPARIN SODIUM 30 MG/0.3ML IJ SOSY
30.0000 mg | PREFILLED_SYRINGE | INTRAMUSCULAR | Status: DC
  Administered 2023-07-23: 30 mg via SUBCUTANEOUS
  Filled 2023-07-23: qty 0.3

## 2023-07-23 MED ORDER — SENNOSIDES-DOCUSATE SODIUM 8.6-50 MG PO TABS: 1.0000 | ORAL_TABLET | Freq: Every evening | ORAL | Status: DC | PRN

## 2023-07-23 MED ORDER — ACETAMINOPHEN 325 MG PO TABS
650.0000 mg | ORAL_TABLET | Freq: Four times a day (QID) | ORAL | Status: DC | PRN
  Administered 2023-07-25 (×2): 650 mg via ORAL
  Filled 2023-07-23 (×2): qty 2

## 2023-07-23 MED ORDER — MIRTAZAPINE 15 MG PO TABS
7.5000 mg | ORAL_TABLET | Freq: Every evening | ORAL | Status: DC
  Administered 2023-07-23 – 2023-07-27 (×5): 7.5 mg via ORAL
  Filled 2023-07-23 (×5): qty 1

## 2023-07-23 MED ORDER — LACTATED RINGERS IV SOLN: INTRAVENOUS | Status: AC

## 2023-07-23 MED ORDER — SODIUM CHLORIDE 0.9 % IV SOLN
2.0000 g | INTRAVENOUS | Status: DC
  Administered 2023-07-24 – 2023-07-27 (×4): 2 g via INTRAVENOUS
  Filled 2023-07-23 (×4): qty 20

## 2023-07-23 MED ORDER — ONDANSETRON HCL 4 MG PO TABS
4.0000 mg | ORAL_TABLET | Freq: Four times a day (QID) | ORAL | Status: DC | PRN
  Administered 2023-07-25: 4 mg via ORAL
  Filled 2023-07-23: qty 1

## 2023-07-23 MED ORDER — METRONIDAZOLE 500 MG/100ML IV SOLN
500.0000 mg | Freq: Two times a day (BID) | INTRAVENOUS | Status: DC
  Administered 2023-07-23 – 2023-07-27 (×9): 500 mg via INTRAVENOUS
  Filled 2023-07-23 (×9): qty 100

## 2023-07-23 MED ORDER — ONDANSETRON HCL 4 MG/2ML IJ SOLN: 4.0000 mg | Freq: Four times a day (QID) | INTRAMUSCULAR | Status: DC | PRN

## 2023-07-23 MED ORDER — POTASSIUM PHOSPHATES 15 MMOLE/5ML IV SOLN
30.0000 mmol | Freq: Once | INTRAVENOUS | Status: DC
  Filled 2023-07-23: qty 10

## 2023-07-23 MED ORDER — PANTOPRAZOLE SODIUM 40 MG PO TBEC
40.0000 mg | DELAYED_RELEASE_TABLET | Freq: Every day | ORAL | Status: DC
  Administered 2023-07-23 – 2023-07-28 (×6): 40 mg via ORAL
  Filled 2023-07-23 (×6): qty 1

## 2023-07-23 MED ORDER — ACETAMINOPHEN 650 MG RE SUPP: 650.0000 mg | Freq: Four times a day (QID) | RECTAL | Status: DC | PRN

## 2023-07-23 MED ORDER — LOSARTAN POTASSIUM 50 MG PO TABS
25.0000 mg | ORAL_TABLET | Freq: Every evening | ORAL | Status: DC
  Administered 2023-07-23 – 2023-07-27 (×5): 25 mg via ORAL
  Filled 2023-07-23 (×5): qty 1

## 2023-07-23 MED ORDER — SODIUM CHLORIDE 0.9 % IV SOLN
1.0000 g | Freq: Once | INTRAVENOUS | Status: AC
Start: 2023-07-23 — End: 2023-07-23
  Administered 2023-07-23: 1 g via INTRAVENOUS
  Filled 2023-07-23: qty 10

## 2023-07-23 MED ORDER — MAGNESIUM SULFATE 2 GM/50ML IV SOLN
2.0000 g | Freq: Once | INTRAVENOUS | Status: AC
  Administered 2023-07-23: 2 g via INTRAVENOUS
  Filled 2023-07-23: qty 50

## 2023-07-23 MED ORDER — IOHEXOL 350 MG/ML SOLN
60.0000 mL | Freq: Once | INTRAVENOUS | Status: AC | PRN
  Administered 2023-07-23: 60 mL via INTRAVENOUS

## 2023-07-23 MED ORDER — METRONIDAZOLE 500 MG/100ML IV SOLN
500.0000 mg | Freq: Once | INTRAVENOUS | Status: AC
  Administered 2023-07-23: 500 mg via INTRAVENOUS
  Filled 2023-07-23: qty 100

## 2023-07-23 MED ORDER — ONDANSETRON HCL 4 MG/2ML IJ SOLN
4.0000 mg | Freq: Once | INTRAMUSCULAR | Status: AC
  Administered 2023-07-23: 4 mg via INTRAVENOUS
  Filled 2023-07-23: qty 2

## 2023-07-23 MED ORDER — POTASSIUM CHLORIDE CRYS ER 20 MEQ PO TBCR
40.0000 meq | EXTENDED_RELEASE_TABLET | Freq: Once | ORAL | Status: AC
  Administered 2023-07-23: 40 meq via ORAL
  Filled 2023-07-23: qty 2

## 2023-07-23 MED ORDER — BOOST PO LIQD
237.0000 mL | Freq: Three times a day (TID) | ORAL | Status: DC
  Administered 2023-07-23 – 2023-07-28 (×9): 237 mL via ORAL
  Filled 2023-07-23 (×18): qty 237

## 2023-07-23 MED ORDER — LACTATED RINGERS IV BOLUS
1000.0000 mL | Freq: Once | INTRAVENOUS | Status: AC
  Administered 2023-07-23: 1000 mL via INTRAVENOUS

## 2023-07-23 MED ORDER — ATENOLOL 50 MG PO TABS
50.0000 mg | ORAL_TABLET | Freq: Every day | ORAL | Status: DC
  Administered 2023-07-23 – 2023-07-28 (×6): 50 mg via ORAL
  Filled 2023-07-23 (×2): qty 1
  Filled 2023-07-23: qty 2
  Filled 2023-07-23 (×3): qty 1

## 2023-07-23 MED ORDER — VITAMIN D 25 MCG (1000 UNIT) PO TABS
1000.0000 [IU] | ORAL_TABLET | Freq: Every evening | ORAL | Status: DC
  Administered 2023-07-23 – 2023-07-27 (×5): 1000 [IU] via ORAL
  Filled 2023-07-23 (×5): qty 1

## 2023-07-23 MED ORDER — HYDROMORPHONE HCL 1 MG/ML IJ SOLN
0.5000 mg | Freq: Four times a day (QID) | INTRAMUSCULAR | Status: DC | PRN
  Administered 2023-07-24 – 2023-07-25 (×2): 0.5 mg via INTRAVENOUS
  Filled 2023-07-23 (×2): qty 0.5

## 2023-07-23 NOTE — Plan of Care (Signed)

## 2023-07-23 NOTE — ED Notes (Signed)
Date and time results received: 07/23/23 0549 (use smartphrase ".now" to insert current time)  Test: Lactic Acid  Critical Value: 3.2  Name of Provider Notified: Randel Books MD  Orders Received? Or Actions Taken?:  MD to place new orders

## 2023-07-23 NOTE — ED Triage Notes (Signed)
Pt BIB [facility] from home d/t Right sided ABD pain with N/V that started tonight.  Pt is guarded - son called after she was banging on floor to wake him.

## 2023-07-23 NOTE — ED Notes (Signed)
ED TO INPATIENT HANDOFF REPORT  ED Nurse Name and Phone #:  [place] [phone]  S Name/Age/Gender Christine Middleton 89 y.o. female Room/Bed: 006C/006C  Code Status   Code Status: Limited: Do not attempt resuscitation (DNR) -DNR-LIMITED -Do Not Intubate/DNI   Home/SNF/Other Home Patient oriented to: self, place, and situation Is this baseline? Yes   Triage Complete: Triage complete  Chief Complaint Cholecystitis [K81.9]  Triage Note Pt BIB [facility] from home d/t Right sided ABD pain with N/V that started tonight.  Pt is guarded - son called after she was banging on floor to wake him.     Allergies Allergies  Allergen Reactions   Codeine Other (See Comments)    Unknown reaction   Lipitor [Atorvastatin] Other (See Comments)    Myalgias  Joint stiffness   Macrobid [Nitrofurantoin] Other (See Comments)    Unknown reaction   Zestril [Lisinopril] Other (See Comments)    Unknown reaction   Amoxil [Amoxicillin] Rash   Ultram [Tramadol] Anxiety and Other (See Comments)    Sedation Worsening depression    Level of Care/Admitting Diagnosis ED Disposition     ED Disposition  Admit   Condition  --   Comment  Hospital Area: [facility] [[id number]]  Level of Care: Telemetry Medical [104]  May admit patient to [facility] or [facility] if equivalent level of care is available:: No  Covid Evaluation: Asymptomatic - no recent exposure (last 10 days) testing not required  Diagnosis: Cholecystitis [742595]  Admitting Physician: Steffanie Rainwater [[id number]]  Attending Physician: Steffanie Rainwater [[id number]]  Certification:: I certify this patient will need inpatient services for at least 2 midnights  Expected Medical Readiness: 07/25/2023          B Medical/Surgery History Past Medical History:  Diagnosis Date   Blood transfusion without reported diagnosis    Diverticulitis    Esophageal stricture    GERD (gastroesophageal reflux disease)    Hyperlipidemia     Hypertension    Shingles    Stroke (HCC)    Vitamin B 12 deficiency    Past Surgical History:  Procedure Laterality Date   ABDOMINAL HYSTERECTOMY     APPENDECTOMY     COLONOSCOPY  12-09-08   per Dr. Jarold Motto (incomplete along with barium enema 12-10-08) with hemorrhoids  and diverticulae   COLONOSCOPY WITH PROPOFOL N/A 07/10/2019   Procedure: COLONOSCOPY WITH PROPOFOL;  Surgeon: Lemar Lofty., MD;  Location: [facility];  Service: Gastroenterology;  Laterality: N/A;   ESOPHAGOGASTRODUODENOSCOPY  09-2004   per Dr. Jarold Motto with dilatation    EYE SURGERY     HEMOSTASIS CLIP PLACEMENT  07/10/2019   Procedure: HEMOSTASIS CLIP PLACEMENT;  Surgeon: Lemar Lofty., MD;  Location: [facility];  Service: Gastroenterology;;   INTRAMEDULLARY (IM) NAIL INTERTROCHANTERIC Right 03/17/2023   Procedure: INTRAMEDULLARY (IM) NAIL INTERTROCHANTERIC;  Surgeon: London Sheer, MD;  Location: [facility] OR;  Service: Orthopedics;  Laterality: Right;   POLYPECTOMY  07/10/2019   Procedure: POLYPECTOMY;  Surgeon: Mansouraty, Netty Starring., MD;  Location: [facility];  Service: Gastroenterology;;   TONSILLECTOMY       A IV Location/Drains/Wounds Patient Lines/Drains/Airways Status     Active Line/Drains/Airways     Name Placement date Placement time Site Days   Peripheral IV 07/23/23 22 G 1" Anterior;Left Hand 07/23/23  0320  Hand  less than 1   Peripheral IV 07/23/23 20 G Right Antecubital 07/23/23  0509  Antecubital  less than 1  Intake/Output Last 24 hours  Intake/Output Summary (Last 24 hours) at 07/23/2023 1006 Last data filed at 07/23/2023 0651 Gross per 24 hour  Intake 1000 ml  Output --  Net 1000 ml    Labs/Imaging Results for orders placed or performed during the hospital encounter of 07/23/23 (from the past 48 hours)  CBC with Differential     Status: Abnormal   Collection Time: 07/23/23  4:02 AM  Result Value Ref Range   WBC 11.7 (H) 4.0 - 10.5 K/uL    RBC 4.10 3.87 - 5.11 MIL/uL   Hemoglobin 12.2 12.0 - 15.0 g/dL   HCT 28.4 13.2 - 44.0 %   MCV 91.7 80.0 - 100.0 fL   MCH 29.8 26.0 - 34.0 pg   MCHC 32.4 30.0 - 36.0 g/dL   RDW 10.2 72.5 - 36.6 %   Platelets 190 150 - 400 K/uL   nRBC 0.0 0.0 - 0.2 %   Neutrophils Relative % 93 %   Neutro Abs 10.9 (H) 1.7 - 7.7 K/uL   Lymphocytes Relative 2 %   Lymphs Abs 0.2 (L) 0.7 - 4.0 K/uL   Monocytes Relative 4 %   Monocytes Absolute 0.5 0.1 - 1.0 K/uL   Eosinophils Relative 0 %   Eosinophils Absolute 0.0 0.0 - 0.5 K/uL   Basophils Relative 0 %   Basophils Absolute 0.0 0.0 - 0.1 K/uL   Immature Granulocytes 1 %   Abs Immature Granulocytes 0.07 0.00 - 0.07 K/uL    Comment: Performed at [facility], [address] [place], [place] [address]  Comprehensive metabolic panel     Status: Abnormal   Collection Time: 07/23/23  4:02 AM  Result Value Ref Range   Sodium 136 135 - 145 mmol/L   Potassium 3.4 (L) 3.5 - 5.1 mmol/L   Chloride 101 98 - 111 mmol/L   CO2 23 22 - 32 mmol/L   Glucose, Bld 119 (H) 70 - 99 mg/dL    Comment: Glucose reference range applies only to samples taken after fasting for at least 8 hours.   BUN 11 8 - 23 mg/dL   Creatinine, Ser 7.42 (H) 0.44 - 1.00 mg/dL   Calcium 8.7 (L) 8.9 - 10.3 mg/dL   Total Protein 6.3 (L) 6.5 - 8.1 g/dL   Albumin 2.9 (L) 3.5 - 5.0 g/dL   AST 595 (H) 15 - 41 U/L   ALT 107 (H) 0 - 44 U/L   Alkaline Phosphatase 213 (H) 38 - 126 U/L   Total Bilirubin 1.6 (H) <1.2 mg/dL   GFR, Estimated 47 (L) >60 mL/min    Comment: (NOTE) Calculated using the CKD-EPI Creatinine Equation (2021)    Anion gap 12 5 - 15    Comment: Performed at [facility], [address] [place], [place] [address]  Lipase, blood     Status: None   Collection Time: 07/23/23  4:02 AM  Result Value Ref Range   Lipase 38 11 - 51 U/L    Comment: Performed at [facility], [address], [place], [place] [address]  Troponin I (High Sensitivity)     Status: None    Collection Time: 07/23/23  4:02 AM  Result Value Ref Range   Troponin I (High Sensitivity) 16 <18 ng/L    Comment: (NOTE) Elevated high sensitivity troponin I (hsTnI) values and significant  changes across serial measurements may suggest ACS but many other  chronic and acute conditions are known to elevate hsTnI  results.  Refer to the "Links" section for chest pain algorithms and additional  guidance. Performed at [facility], [address] [place], [place] [address]   Lactic acid, plasma     Status: Abnormal   Collection Time: 07/23/23  4:03 AM  Result Value Ref Range   Lactic Acid, Venous 3.2 (HH) 0.5 - 1.9 mmol/L    Comment: CRITICAL RESULT CALLED TO, READ BACK BY AND VERIFIED WITH Henriette Combs RN @ [phone] 07/23/23 JBUTLER Performed at [facility], [address] [place], [place] [address]   POC occult blood, ED     Status: None   Collection Time: 07/23/23  4:43 AM  Result Value Ref Range   Fecal Occult Bld NEGATIVE NEGATIVE  Lactic acid, plasma     Status: Abnormal   Collection Time: 07/23/23  6:42 AM  Result Value Ref Range   Lactic Acid, Venous 3.2 (HH) 0.5 - 1.9 mmol/L    Comment: CRITICAL RESULT CALLED TO, READ BACK BY AND VERIFIED WITH S.PAUL,RN 0739 07/23/23 CLARK,S Performed at [facility], [address], [place], [place] [address]   Troponin I (High Sensitivity)     Status: Abnormal   Collection Time: 07/23/23  6:42 AM  Result Value Ref Range   Troponin I (High Sensitivity) 19 (H) <18 ng/L    Comment: (NOTE) Elevated high sensitivity troponin I (hsTnI) values and significant  changes across serial measurements may suggest ACS but many other  chronic and acute conditions are known to elevate hsTnI results.  Refer to the "Links" section for chest pain algorithms and additional  guidance. Performed at [facility], [address], [place], [place] [address]    US Abdomen Limited RUQ (LIVER/GB) Result Date: 07/23/2023 CLINICAL DATA:   Right upper quadrant pain EXAM: ULTRASOUND ABDOMEN LIMITED RIGHT UPPER QUADRANT COMPARISON:  Abdominal CT from earlier today FINDINGS: Gallbladder: There is pericholecystic fluid but no focal tenderness or detected calculi. Common bile duct: Diameter: 5 mm Liver: Solitary, known partially shadowing mass in the right lobe liver, present for multiple years by CT, favoring a benign process such as hyalinized hemangioma. Portal vein is patent on color Doppler imaging with normal direction of blood flow towards the liver. IMPRESSION: Pericholecystic fluid, presumably reactive as no detected calculi or sonographic Murphy sign. Electronically Signed   By: Tiburcio Pea M.D.   On: 07/23/2023 07:44   CT ABDOMEN PELVIS W CONTRAST Result Date: 07/23/2023 CLINICAL DATA:  Right-sided abdominal pain with nausea and vomiting that started tonight. EXAM: CT ABDOMEN AND PELVIS WITH CONTRAST TECHNIQUE: Multidetector CT imaging of the abdomen and pelvis was performed using the standard protocol following bolus administration of intravenous contrast. RADIATION DOSE REDUCTION: This exam was performed according to the departmental dose-optimization program which includes automated exposure control, adjustment of the mA and/or kV according to patient size and/or use of iterative reconstruction technique. CONTRAST:  60mL OMNIPAQUE IOHEXOL 350 MG/ML SOLN COMPARISON:  12/19/2022 FINDINGS: Lower chest: Borderline interlobular septal thickening at the lung bases. Stable mild GE junction esophageal thickening favoring hiatal hernia rather than acute inflammation. Hepatobiliary: Dystrophic calcification within a 17 mm right liver mass, possibly hyalinized hemangioma given multiyear stability, still stable.Indistinct gallbladder wall with possible faint calcification of internal calculi. No biliary dilatation or calcified choledocholithiasis. Pancreas: Unremarkable. Spleen: Lobulation of the capsule attributed to scarring. Adrenals/Urinary  Tract: Negative adrenals. No hydronephrosis or stone. Left renal cortical thinning which is patchy in from scarring. Milder right renal cortical scarring.  Unremarkable bladder. Stomach/Bowel: No obstruction. No visible bowel inflammation. Distal colonic diverticulosis and moderate stool. Vascular/Lymphatic: No acute vascular abnormality. Extensive atheromatous calcification of the aorta and iliacs. No mass or adenopathy. Reproductive:Hysterectomy. Other: No ascites or pneumoperitoneum. Musculoskeletal: Proximal right femur fracture repair without complicating features. L1-L3 remote compression fractures with advanced height loss at L1 where there has been interval healing of the fracture cleft containing gas. IMPRESSION: 1. Borderline thickening of the gallbladder which could contain calculi, correlate for cholecystitis symptoms. 2. Chronic findings are stable from prior and described above. Electronically Signed   By: Tiburcio Pea M.D.   On: 07/23/2023 06:44    Pending Labs Unresulted Labs (From admission, onward)     Start     Ordered   07/23/23 1001  Magnesium  Once,   R        07/23/23 1000   07/23/23 1001  Phosphorus  Once,   R        07/23/23 1000   07/23/23 1001  TSH  Once,   R        07/23/23 1000   07/23/23 1000  Lactic acid, plasma  (Lactic Acid)  STAT Now then every 3 hours,   R (with STAT occurrences)      07/23/23 1000   07/23/23 0402  Urinalysis, Routine w reflex microscopic -Urine, Clean Catch  Once,   URGENT       Question:  Specimen Source  Answer:  Urine, Clean Catch   07/23/23 0402            Vitals/Pain Today's Vitals   07/23/23 0600 07/23/23 0745 07/23/23 0800 07/23/23 0849  BP: (!) 148/70 130/67 126/61 131/69  Pulse: (!) 101 (!) 105 (!) 105 85  Resp: 13 (!) 22 19 (!) 25  Temp:    98.3 F (36.8 C)  TempSrc:    Oral  SpO2: 95% 97% 96% 97%  Weight:      Height:      PainSc:    0-No pain    Isolation Precautions No active  isolations  Medications Medications  enoxaparin (LOVENOX) injection 30 mg (has no administration in time range)  acetaminophen (TYLENOL) tablet 650 mg (has no administration in time range)    Or  acetaminophen (TYLENOL) suppository 650 mg (has no administration in time range)  senna-docusate (Senokot-S) tablet 1 tablet (has no administration in time range)  ondansetron (ZOFRAN) tablet 4 mg (has no administration in time range)    Or  ondansetron (ZOFRAN) injection 4 mg (has no administration in time range)  atenolol (TENORMIN) tablet 50 mg (has no administration in time range)  docusate sodium (COLACE) capsule 200 mg (has no administration in time range)  cholecalciferol (VITAMIN D3) 25 MCG (1000 UNIT) tablet 1,000 Units (has no administration in time range)  lactose free nutrition (Boost) liquid 237 mL (has no administration in time range)  losartan (COZAAR) tablet 25 mg (has no administration in time range)  mirtazapine (REMERON) tablet 7.5 mg (has no administration in time range)  pantoprazole (PROTONIX) EC tablet 40 mg (has no administration in time range)  ondansetron (ZOFRAN) injection 4 mg (4 mg Intravenous Given 07/23/23 0517)  lactated ringers bolus 1,000 mL (0 mLs Intravenous Stopped 07/23/23 0651)  iohexol (OMNIPAQUE) 350 MG/ML injection 60 mL (60 mLs Intravenous Contrast Given 07/23/23 0618)  cefTRIAXone (ROCEPHIN) 1 g in sodium chloride 0.9 % 100 mL IVPB (0 g Intravenous Stopped 07/23/23 0741)  metroNIDAZOLE (FLAGYL) IVPB 500 mg (0 mg Intravenous Stopped 07/23/23 0741)  lactated ringers bolus 1,000 mL (0 mLs Intravenous Stopped 07/23/23 0900)    Mobility walks     Focused Assessments Cardiac Assessment Handoff:  Cardiac Rhythm: Atrial fibrillation Lab Results  Component Value Date   CKTOTAL 58 07/10/2009   CKMB 1.1 07/10/2009   TROPONINI 0.04        NO INDICATION OF MYOCARDIAL INJURY. 07/10/2009   No results found for: "DDIMER" Does the Patient currently have  chest pain? No   , Neuro Assessment Handoff:  Swallow screen pass? Yes  Cardiac Rhythm: Atrial fibrillation       Neuro Assessment:   Neuro Checks:      Has TPA been given? No If patient is a Neuro Trauma and patient is going to OR before floor call report to 4N Charge nurse: [phone] or [phone]  , Pulmonary Assessment Handoff:  Lung sounds:   O2 Device: Room Air      R Recommendations: See Admitting Provider Note  Report given to:   Additional Notes:  walks w/a walker

## 2023-07-23 NOTE — Consult Note (Signed)
Christine Middleton 1923-07-03  [id number].    Requesting MD: Rancour Chief Complaint/Reason for Consult: Cholecystitis   HPI:  89 y/o F w/ a hx of HTN, HLD, and prior stroke who reportedly presented with a 1 day of abdominal pain and diarrhea.  CT showed borderline GB wall thickening and follow up US showed pericholecystic fluid without evidence of gallstones.   WBC 12.  Tbili 1.6, Alk phos 213, AST 265, ALT 107.    On exam, patient resting comfortably.  She is very tearful and reports that she does not wish to have surgery or any other medical care.  She wants to go home. She denies current abdominal pain.  She lives with her son but states that she does most ADL herself but has been limited since a hip fracture in August.    ROS: Review of Systems  Constitutional: Negative.   HENT: Negative.    Eyes: Negative.   Respiratory: Negative.    Cardiovascular: Negative.   Gastrointestinal: Negative.   Genitourinary: Negative.   Musculoskeletal:  Positive for joint pain.  Skin: Negative.   Neurological: Negative.   Endo/Heme/Allergies: Negative.   Psychiatric/Behavioral: Negative.      Family History  Problem Relation Age of Onset   Heart attack Mother    Heart disease Father    Stroke Sister    Cancer Brother    Cancer Brother    Diabetes Brother    Breast cancer Other    Diabetes Other    Hypertension Other    Stroke Other    Heart disease Other    Colon cancer Neg Hx    Stomach cancer Neg Hx    Esophageal cancer Neg Hx     Past Medical History:  Diagnosis Date   Blood transfusion without reported diagnosis    Diverticulitis    Esophageal stricture    GERD (gastroesophageal reflux disease)    Hyperlipidemia    Hypertension    Shingles    Stroke (HCC)    Vitamin B 12 deficiency     Past Surgical History:  Procedure Laterality Date   ABDOMINAL HYSTERECTOMY     APPENDECTOMY     COLONOSCOPY  12-09-08   per Dr. Jarold Motto (incomplete along with barium enema  12-10-08) with hemorrhoids  and diverticulae   COLONOSCOPY WITH PROPOFOL N/A 07/10/2019   Procedure: COLONOSCOPY WITH PROPOFOL;  Surgeon: Lemar Lofty., MD;  Location: [facility];  Service: Gastroenterology;  Laterality: N/A;   ESOPHAGOGASTRODUODENOSCOPY  09-2004   per Dr. Jarold Motto with dilatation    EYE SURGERY     HEMOSTASIS CLIP PLACEMENT  07/10/2019   Procedure: HEMOSTASIS CLIP PLACEMENT;  Surgeon: Lemar Lofty., MD;  Location: [facility];  Service: Gastroenterology;;   INTRAMEDULLARY (IM) NAIL INTERTROCHANTERIC Right 03/17/2023   Procedure: INTRAMEDULLARY (IM) NAIL INTERTROCHANTERIC;  Surgeon: London Sheer, MD;  Location: [facility];  Service: Orthopedics;  Laterality: Right;   POLYPECTOMY  07/10/2019   Procedure: POLYPECTOMY;  Surgeon: Mansouraty, Netty Starring., MD;  Location: [facility];  Service: Gastroenterology;;   TONSILLECTOMY      Social History:  reports that she has never smoked. She has never used smokeless tobacco. She reports that she does not drink alcohol and does not use drugs.  Allergies:  Allergies  Allergen Reactions   Codeine Other (See Comments)    Unknown reaction   Lipitor [Atorvastatin] Other (See Comments)    Myalgias  Joint stiffness   Macrobid [Nitrofurantoin] Other (See Comments)  Unknown reaction   Zestril [Lisinopril] Other (See Comments)    Unknown reaction   Amoxil [Amoxicillin] Rash   Ultram [Tramadol] Anxiety and Other (See Comments)    Sedation Worsening depression    (Not in a hospital admission)   Physical Exam: Blood pressure 130/67, pulse (!) 105, temperature 98.4 F (36.9 C), resp. rate (!) 22, height 5\' 2"  (1.575 m), weight 58.1 kg, SpO2 97%. Gen: elderly female, tearful, NAD Abd: soft, non-distended, non-TTP  Results for orders placed or performed during the hospital encounter of 07/23/23 (from the past 48 hours)  CBC with Differential     Status: Abnormal   Collection Time: 07/23/23  4:02 AM  Result  Value Ref Range   WBC 11.7 (H) 4.0 - 10.5 K/uL   RBC 4.10 3.87 - 5.11 MIL/uL   Hemoglobin 12.2 12.0 - 15.0 g/dL   HCT 78.2 95.6 - 21.3 %   MCV 91.7 80.0 - 100.0 fL   MCH 29.8 26.0 - 34.0 pg   MCHC 32.4 30.0 - 36.0 g/dL   RDW 08.6 57.8 - 46.9 %   Platelets 190 150 - 400 K/uL   nRBC 0.0 0.0 - 0.2 %   Neutrophils Relative % 93 %   Neutro Abs 10.9 (H) 1.7 - 7.7 K/uL   Lymphocytes Relative 2 %   Lymphs Abs 0.2 (L) 0.7 - 4.0 K/uL   Monocytes Relative 4 %   Monocytes Absolute 0.5 0.1 - 1.0 K/uL   Eosinophils Relative 0 %   Eosinophils Absolute 0.0 0.0 - 0.5 K/uL   Basophils Relative 0 %   Basophils Absolute 0.0 0.0 - 0.1 K/uL   Immature Granulocytes 1 %   Abs Immature Granulocytes 0.07 0.00 - 0.07 K/uL    Comment: Performed at [facility], [address] [place], [place] [address]  Comprehensive metabolic panel     Status: Abnormal   Collection Time: 07/23/23  4:02 AM  Result Value Ref Range   Sodium 136 135 - 145 mmol/L   Potassium 3.4 (L) 3.5 - 5.1 mmol/L   Chloride 101 98 - 111 mmol/L   CO2 23 22 - 32 mmol/L   Glucose, Bld 119 (H) 70 - 99 mg/dL    Comment: Glucose reference range applies only to samples taken after fasting for at least 8 hours.   BUN 11 8 - 23 mg/dL   Creatinine, Ser 8.41 (H) 0.44 - 1.00 mg/dL   Calcium 8.7 (L) 8.9 - 10.3 mg/dL   Total Protein 6.3 (L) 6.5 - 8.1 g/dL   Albumin 2.9 (L) 3.5 - 5.0 g/dL   AST 324 (H) 15 - 41 U/L   ALT 107 (H) 0 - 44 U/L   Alkaline Phosphatase 213 (H) 38 - 126 U/L   Total Bilirubin 1.6 (H) <1.2 mg/dL   GFR, Estimated 47 (L) >60 mL/min    Comment: (NOTE) Calculated using the CKD-EPI Creatinine Equation (2021)    Anion gap 12 5 - 15    Comment: Performed at [facility], [address], [place], [place] [address]  Lipase, blood     Status: None   Collection Time: 07/23/23  4:02 AM  Result Value Ref Range   Lipase 38 11 - 51 U/L    Comment: Performed at [facility], [address] [place], [place]  [address]  Troponin I (High Sensitivity)     Status: None   Collection Time: 07/23/23  4:02 AM  Result Value Ref Range  Troponin I (High Sensitivity) 16 <18 ng/L    Comment: (NOTE) Elevated high sensitivity troponin I (hsTnI) values and significant  changes across serial measurements may suggest ACS but many other  chronic and acute conditions are known to elevate hsTnI results.  Refer to the "Links" section for chest pain algorithms and additional  guidance. Performed at [facility], [address] [place], [place] [address]   Lactic acid, plasma     Status: Abnormal   Collection Time: 07/23/23  4:03 AM  Result Value Ref Range   Lactic Acid, Venous 3.2 (HH) 0.5 - 1.9 mmol/L    Comment: CRITICAL RESULT CALLED TO, READ BACK BY AND VERIFIED WITH Henriette Combs RN @ [phone] 07/23/23 JBUTLER Performed at [facility], [address], [place], [place] [address]   POC occult blood, ED     Status: None   Collection Time: 07/23/23  4:43 AM  Result Value Ref Range   Fecal Occult Bld NEGATIVE NEGATIVE  Lactic acid, plasma     Status: Abnormal   Collection Time: 07/23/23  6:42 AM  Result Value Ref Range   Lactic Acid, Venous 3.2 (HH) 0.5 - 1.9 mmol/L    Comment: CRITICAL RESULT CALLED TO, READ BACK BY AND VERIFIED WITH S.PAUL,RN 0739 07/23/23 CLARK,S Performed at [facility], [address], [place], [place] [address]   Troponin I (High Sensitivity)     Status: Abnormal   Collection Time: 07/23/23  6:42 AM  Result Value Ref Range   Troponin I (High Sensitivity) 19 (H) <18 ng/L    Comment: (NOTE) Elevated high sensitivity troponin I (hsTnI) values and significant  changes across serial measurements may suggest ACS but many other  chronic and acute conditions are known to elevate hsTnI results.  Refer to the "Links" section for chest pain algorithms and additional  guidance. Performed at [facility], [address], [place], [place] [address]    US Abdomen Limited  RUQ (LIVER/GB) Result Date: 07/23/2023 CLINICAL DATA:  Right upper quadrant pain EXAM: ULTRASOUND ABDOMEN LIMITED RIGHT UPPER QUADRANT COMPARISON:  Abdominal CT from earlier today FINDINGS: Gallbladder: There is pericholecystic fluid but no focal tenderness or detected calculi. Common bile duct: Diameter: 5 mm Liver: Solitary, known partially shadowing mass in the right lobe liver, present for multiple years by CT, favoring a benign process such as hyalinized hemangioma. Portal vein is patent on color Doppler imaging with normal direction of blood flow towards the liver. IMPRESSION: Pericholecystic fluid, presumably reactive as no detected calculi or sonographic Murphy sign. Electronically Signed   By: Tiburcio Pea M.D.   On: 07/23/2023 07:44   CT ABDOMEN PELVIS W CONTRAST Result Date: 07/23/2023 CLINICAL DATA:  Right-sided abdominal pain with nausea and vomiting that started tonight. EXAM: CT ABDOMEN AND PELVIS WITH CONTRAST TECHNIQUE: Multidetector CT imaging of the abdomen and pelvis was performed using the standard protocol following bolus administration of intravenous contrast. RADIATION DOSE REDUCTION: This exam was performed according to the departmental dose-optimization program which includes automated exposure control, adjustment of the mA and/or kV according to patient size and/or use of iterative reconstruction technique. CONTRAST:  60mL OMNIPAQUE IOHEXOL 350 MG/ML SOLN COMPARISON:  12/19/2022 FINDINGS: Lower chest: Borderline interlobular septal thickening at the lung bases. Stable mild GE junction esophageal thickening favoring hiatal hernia rather than acute inflammation. Hepatobiliary: Dystrophic calcification within a 17 mm right liver mass, possibly hyalinized hemangioma given multiyear stability, still stable.Indistinct gallbladder wall with possible faint calcification of  internal calculi. No biliary dilatation or calcified choledocholithiasis. Pancreas: Unremarkable. Spleen: Lobulation  of the capsule attributed to scarring. Adrenals/Urinary Tract: Negative adrenals. No hydronephrosis or stone. Left renal cortical thinning which is patchy in from scarring. Milder right renal cortical scarring. Unremarkable bladder. Stomach/Bowel: No obstruction. No visible bowel inflammation. Distal colonic diverticulosis and moderate stool. Vascular/Lymphatic: No acute vascular abnormality. Extensive atheromatous calcification of the aorta and iliacs. No mass or adenopathy. Reproductive:Hysterectomy. Other: No ascites or pneumoperitoneum. Musculoskeletal: Proximal right femur fracture repair without complicating features. L1-L3 remote compression fractures with advanced height loss at L1 where there has been interval healing of the fracture cleft containing gas. IMPRESSION: 1. Borderline thickening of the gallbladder which could contain calculi, correlate for cholecystitis symptoms. 2. Chronic findings are stable from prior and described above. Electronically Signed   By: Tiburcio Pea M.D.   On: 07/23/2023 06:44    Assessment/Plan 89 y/o F presenting with abdominal pain and has imaging showing GB wall thickening and fluid without clear evidence of cholelithiasis.    - During the interview, the patient was adamant that she does not want surgery.   - She does have some signs of GB disease on imaging and her Tbili and LFTs are elevated, but there is no urgent indication for surgery. Would recommend engaging patient and her family regarding goals of care.  In the meantime, it would be reasonable to treat her with abx for possible cholecystitis and trend her LFTs.  [facility] 07/23/2023, 7:58 AM Please see Amion for pager number during day hours 7:00am-4:30pm or 7:00am -11:30am on weekends

## 2023-07-23 NOTE — ED Provider Notes (Signed)
[place] EMERGENCY DEPARTMENT AT [facility] Provider Note   CSN: [id number] Arrival date & time: 07/23/23  4132     History  Chief Complaint  Patient presents with   Abdominal Pain    Christine Middleton is a 89 y.o. female.  Patient with a history of hyperlipidemia, hypertension, stroke, previous right hip fracture in August presents with abdominal pain and vomiting that woke her up from sleep.  States she went to bed around 7 PM feeling well but woke up feeling sick about 10 PM with right-sided lower abdominal pain and multiple episodes of vomiting.  Believes she has had loose stools as well.  No blood in emesis or blood in the stool.  No pain with urination or blood in the urine.  No fever.  No chest pain or shortness of breath.  Denies previous abdominal surgeries.  States she has been regular with her bowels after taking MiraLAX at home and her last bowel movement was 2 days ago.  The history is provided by the patient.  Abdominal Pain Associated symptoms: diarrhea, nausea and vomiting   Associated symptoms: no chest pain, no cough, no dysuria, no fever, no hematuria and no shortness of breath        Home Medications Prior to Admission medications   Medication Sig Start Date End Date Taking? Authorizing Provider  atenolol (TENORMIN) 50 MG tablet TAKE 1 TABLET BY MOUTH EVERY DAY 02/06/23   Nelwyn Salisbury, MD  cholecalciferol (VITAMIN D3) 25 MCG (1000 UNIT) tablet Take 1,000 Units by mouth every evening.    [provider]  docusate sodium (COLACE) 100 MG capsule Take 2 capsules (200 mg total) by mouth 2 (two) times daily. 12/23/22   Leroy Sea, MD  Iron, Ferrous Sulfate, 325 (65 Fe) MG TABS Take 325 mg by mouth daily. 01/10/23   Nelwyn Salisbury, MD  lactose free nutrition (BOOST) LIQD Take 237 mLs by mouth 3 (three) times daily between meals.    [provider]  losartan (COZAAR) 25 MG tablet Take 1 tablet (25 mg total) by mouth every evening.  11/14/22   Nelwyn Salisbury, MD  mirtazapine (REMERON) 15 MG tablet Take 1/2 (7.5 mg) tablet by mouth at bedtime Patient taking differently: Take 7.5 mg by mouth every evening. 11/14/22   Nelwyn Salisbury, MD  pantoprazole (PROTONIX) 40 MG tablet TAKE 1 TABLET BY MOUTH EVERY DAY 04/10/23   Nelwyn Salisbury, MD      Allergies    Codeine, Lipitor [atorvastatin], Macrobid [nitrofurantoin], Zestril [lisinopril], Amoxil [amoxicillin], and Ultram [tramadol]    Review of Systems   Review of Systems  Constitutional:  Positive for activity change and appetite change. Negative for fever.  HENT:  Negative for congestion and rhinorrhea.   Respiratory:  Negative for cough, chest tightness and shortness of breath.   Cardiovascular:  Negative for chest pain.  Gastrointestinal:  Positive for abdominal pain, diarrhea, nausea and vomiting.  Genitourinary:  Negative for dysuria and hematuria.  Musculoskeletal:  Negative for arthralgias and myalgias.  Skin:  Negative for rash.  Neurological:  Negative for dizziness, weakness and headaches.   all other systems are negative except as noted in the HPI and PMH.    Physical Exam Updated Vital Signs BP (!) 150/75   Pulse 98   Temp 98.4 F (36.9 C)   Resp 18   Ht 5\' 2"  (1.575 m)   Wt 58.1 kg   SpO2 95%   BMI 23.43 kg/m  Physical Exam Vitals and nursing note reviewed.  Constitutional:      General: She is not in acute distress.    Appearance: She is well-developed. She is not ill-appearing.  HENT:     Head: Normocephalic and atraumatic.     Mouth/Throat:     Pharynx: No oropharyngeal exudate.  Eyes:     Conjunctiva/sclera: Conjunctivae normal.     Pupils: Pupils are equal, round, and reactive to light.  Neck:     Comments: No meningismus. Cardiovascular:     Rate and Rhythm: Normal rate and regular rhythm.     Heart sounds: Normal heart sounds. No murmur heard. Pulmonary:     Effort: Pulmonary effort is normal. No respiratory distress.     Breath  sounds: Normal breath sounds.  Chest:     Chest wall: No tenderness.  Abdominal:     Palpations: Abdomen is soft.     Tenderness: There is abdominal tenderness. There is guarding. There is no rebound.     Comments: Right lower quadrant tenderness and periumbilical tenderness with voluntary guarding.  No rebound.  Genitourinary:    Comments: Chaperone Lisa PA-C. Brown stool, nonbloody, no fecal impaction Musculoskeletal:        General: No tenderness. Normal range of motion.     Cervical back: Normal range of motion and neck supple.  Skin:    General: Skin is warm.  Neurological:     General: No focal deficit present.     Mental Status: She is alert and oriented to person, place, and time. Mental status is at baseline.     Cranial Nerves: No cranial nerve deficit.     Motor: No abnormal muscle tone.     Coordination: Coordination normal.     Comments:  5/5 strength throughout. CN 2-12 intact.Equal grip strength.   Psychiatric:        Behavior: Behavior normal.     ED Results / Procedures / Treatments   Labs (all labs ordered are listed, but only abnormal results are displayed) Labs Reviewed  CBC WITH DIFFERENTIAL/PLATELET - Abnormal; Notable for the following components:      Result Value   WBC 11.7 (*)    Neutro Abs 10.9 (*)    Lymphs Abs 0.2 (*)    All other components within normal limits  COMPREHENSIVE METABOLIC PANEL - Abnormal; Notable for the following components:   Potassium 3.4 (*)    Glucose, Bld 119 (*)    Creatinine, Ser 1.06 (*)    Calcium 8.7 (*)    Total Protein 6.3 (*)    Albumin 2.9 (*)    AST 265 (*)    ALT 107 (*)    Alkaline Phosphatase 213 (*)    Total Bilirubin 1.6 (*)    GFR, Estimated 47 (*)    All other components within normal limits  LACTIC ACID, PLASMA - Abnormal; Notable for the following components:   Lactic Acid, Venous 3.2 (*)    All other components within normal limits  LIPASE, BLOOD  URINALYSIS, ROUTINE W REFLEX MICROSCOPIC   LACTIC ACID, PLASMA  POC OCCULT BLOOD, ED  TROPONIN I (HIGH SENSITIVITY)  TROPONIN I (HIGH SENSITIVITY)    EKG EKG Interpretation Date/Time:  Monday July 23 2023 04:38:20 EST Ventricular Rate:  98 PR Interval:  213 QRS Duration:  110 QT Interval:  354 QTC Calculation: 452 R Axis:   -51  Text Interpretation: Sinus rhythm Borderline prolonged PR interval Left anterior fascicular block LVH with secondary repolarization abnormality  Anterior infarct, old No significant change was found Confirmed by Glynn Octave [phone]) on 07/23/2023 5:20:06 AM  Radiology CT ABDOMEN PELVIS W CONTRAST Result Date: 07/23/2023 CLINICAL DATA:  Right-sided abdominal pain with nausea and vomiting that started tonight. EXAM: CT ABDOMEN AND PELVIS WITH CONTRAST TECHNIQUE: Multidetector CT imaging of the abdomen and pelvis was performed using the standard protocol following bolus administration of intravenous contrast. RADIATION DOSE REDUCTION: This exam was performed according to the departmental dose-optimization program which includes automated exposure control, adjustment of the mA and/or kV according to patient size and/or use of iterative reconstruction technique. CONTRAST:  60mL OMNIPAQUE IOHEXOL 350 MG/ML SOLN COMPARISON:  12/19/2022 FINDINGS: Lower chest: Borderline interlobular septal thickening at the lung bases. Stable mild GE junction esophageal thickening favoring hiatal hernia rather than acute inflammation. Hepatobiliary: Dystrophic calcification within a 17 mm right liver mass, possibly hyalinized hemangioma given multiyear stability, still stable.Indistinct gallbladder wall with possible faint calcification of internal calculi. No biliary dilatation or calcified choledocholithiasis. Pancreas: Unremarkable. Spleen: Lobulation of the capsule attributed to scarring. Adrenals/Urinary Tract: Negative adrenals. No hydronephrosis or stone. Left renal cortical thinning which is patchy in from scarring.  Milder right renal cortical scarring. Unremarkable bladder. Stomach/Bowel: No obstruction. No visible bowel inflammation. Distal colonic diverticulosis and moderate stool. Vascular/Lymphatic: No acute vascular abnormality. Extensive atheromatous calcification of the aorta and iliacs. No mass or adenopathy. Reproductive:Hysterectomy. Other: No ascites or pneumoperitoneum. Musculoskeletal: Proximal right femur fracture repair without complicating features. L1-L3 remote compression fractures with advanced height loss at L1 where there has been interval healing of the fracture cleft containing gas. IMPRESSION: 1. Borderline thickening of the gallbladder which could contain calculi, correlate for cholecystitis symptoms. 2. Chronic findings are stable from prior and described above. Electronically Signed   By: Tiburcio Pea M.D.   On: 07/23/2023 06:44    Procedures Procedures    Medications Ordered in ED Medications  ondansetron (ZOFRAN) injection 4 mg (has no administration in time range)    ED Course/ Medical Decision Making/ A&P                                 Medical Decision Making Amount and/or Complexity of Data Reviewed Labs: ordered. Decision-making details documented in ED Course. Radiology: ordered and independent interpretation performed. Decision-making details documented in ED Course. ECG/medicine tests: ordered and independent interpretation performed. Decision-making details documented in ED Course.  Risk Prescription drug management. Decision regarding hospitalization.  Right-sided abdominal pain with vomiting and diarrhea.  Stable vital signs.  IV fluids given, pain and nausea medications given will obtain labs and CT scan to evaluate for surgical pathology.  Lactate elevated 3.2.  No significant leukocytosis.  Mild transaminitis with normal bilirubin and normal lipase.  CT scan is remarkable for thickening of the gallbladder with likely gallstones.  Will obtain  ultrasound.  Will initiate broad-spectrum intra-abdominal antibiotics.  Will plan observation admission to the hospital as well as surgical consultation.  Patient states that her age she would not want to have surgery.  She may be a candidate for cholecystostomy tube if she is deemed to have cholecystitis.  D/w surgery Dr. Dossie Der.  Agrees with IV antibiotics and medical admission and they will consult.  Right upper quadrant ultrasound pending.  Patient given broad-spectrum antibiotics after cultures are obtained.  She does have a penicillin allergy so she was given Rocephin and Flagyl.  Admission discussed with Dr. Kirke Corin.  Final Clinical Impression(s) / ED Diagnoses Final diagnoses:  RUQ pain  Elevated LFTs    Rx / DC Orders ED Discharge Orders     None         Pamela Maddy, Jeannett Senior, MD 07/23/23 [phone]

## 2023-07-23 NOTE — ED Notes (Signed)
PT not oriented to time

## 2023-07-23 NOTE — ED Notes (Signed)
Admitting

## 2023-07-23 NOTE — H&P (Addendum)
History and Physical    Patient: Christine Middleton ZOX:[id number] DOB: [birth year] DOA: 07/23/2023 DOS: the patient was seen and examined on 07/23/2023 PCP: Nelwyn Salisbury, MD  Patient coming from: Home  Chief Complaint:  Chief Complaint  Patient presents with   Abdominal Pain   HPI: Christine Middleton is a 89 y.o. female with medical history significant of HTN, GERD, HLD, CVA, recent right hip fracture in August, paroxysmal A-fib, and IDA who presents to the ED for evaluation of right-sided abdominal pain with nausea and vomiting. Patient was in her usual state of health until last night when she felt sick around 10 PM with some right-sided abdominal pain. She had multiple emesis and 1 episode of watery stool but denies any hemoptysis. She also denies any bloody stools, dysuria, hematuria, fevers, chills, shortness of breath or chest pain. During my evaluation, patient was adamant about not being cut. States she has been very limited since her hip fracture in August. She does not want any surgeries and would prefer to die peacefully. She is concerned about being a burden on her children and states "I am ready to die". She reiterated that she does not want to be resuscitated if she codes.  ED course: Initial vitals with temp 98.4, RR 18, HR 98, BP 150/75, SpO2 95% on room air. Labs show WBC 11.7, Hgb 12.2, platelet 190, sodium 136, K+ 3.4, creatinine 1.06, albumin 2.9, AST/ALT 265/107, alk phos 213, bilirubin 1.6, lipase 38, troponin 16, lactic acid 3.2->3.2 CT A/P shows borderline thickening of the gallbladder with likely calculi RUQ U/S shows pericholecystic fluid but no detected calculi or sonographic Murphy sign Patient received IV Rocephin, IV metronidazole, IV LR 1 L bolus and IV Zofran 4 mg x 1 General surgery was consulted for evaluation TRH was consulted for admission  Review of Systems: As mentioned in the history of present illness. All other systems reviewed and are negative. Past Medical  History:  Diagnosis Date   Blood transfusion without reported diagnosis    Diverticulitis    Esophageal stricture    GERD (gastroesophageal reflux disease)    Hyperlipidemia    Hypertension    Shingles    Stroke (HCC)    Vitamin B 12 deficiency    Past Surgical History:  Procedure Laterality Date   ABDOMINAL HYSTERECTOMY     APPENDECTOMY     COLONOSCOPY  12-09-08   per Dr. Jarold Motto (incomplete along with barium enema 12-10-08) with hemorrhoids  and diverticulae   COLONOSCOPY WITH PROPOFOL N/A 07/10/2019   Procedure: COLONOSCOPY WITH PROPOFOL;  Surgeon: Lemar Lofty., MD;  Location: [facility];  Service: Gastroenterology;  Laterality: N/A;   ESOPHAGOGASTRODUODENOSCOPY  09-2004   per Dr. Jarold Motto with dilatation    EYE SURGERY     HEMOSTASIS CLIP PLACEMENT  07/10/2019   Procedure: HEMOSTASIS CLIP PLACEMENT;  Surgeon: Lemar Lofty., MD;  Location: [facility];  Service: Gastroenterology;;   INTRAMEDULLARY (IM) NAIL INTERTROCHANTERIC Right 03/17/2023   Procedure: INTRAMEDULLARY (IM) NAIL INTERTROCHANTERIC;  Surgeon: London Sheer, MD;  Location: [facility];  Service: Orthopedics;  Laterality: Right;   POLYPECTOMY  07/10/2019   Procedure: POLYPECTOMY;  Surgeon: Mansouraty, Netty Starring., MD;  Location: [facility];  Service: Gastroenterology;;   TONSILLECTOMY     Social History:  reports that she has never smoked. She has never used smokeless tobacco. She reports that she does not drink alcohol and does not use drugs.  Allergies  Allergen Reactions   Codeine Other (See Comments)  Unknown reaction   Lipitor [Atorvastatin] Other (See Comments)    Myalgias  Joint stiffness   Macrobid [Nitrofurantoin] Other (See Comments)    Unknown reaction   Zestril [Lisinopril] Other (See Comments)    Unknown reaction   Amoxil [Amoxicillin] Rash   Ultram [Tramadol] Anxiety and Other (See Comments)    Sedation Worsening depression    Family History  Problem Relation Age  of Onset   Heart attack Mother    Heart disease Father    Stroke Sister    Cancer Brother    Cancer Brother    Diabetes Brother    Breast cancer Other    Diabetes Other    Hypertension Other    Stroke Other    Heart disease Other    Colon cancer Neg Hx    Stomach cancer Neg Hx    Esophageal cancer Neg Hx     Prior to Admission medications   Medication Sig Start Date End Date Taking? Authorizing Provider  atenolol (TENORMIN) 50 MG tablet TAKE 1 TABLET BY MOUTH EVERY DAY 02/06/23   Nelwyn Salisbury, MD  cholecalciferol (VITAMIN D3) 25 MCG (1000 UNIT) tablet Take 1,000 Units by mouth every evening.    [provider]  docusate sodium (COLACE) 100 MG capsule Take 2 capsules (200 mg total) by mouth 2 (two) times daily. 12/23/22   Leroy Sea, MD  Iron, Ferrous Sulfate, 325 (65 Fe) MG TABS Take 325 mg by mouth daily. 01/10/23   Nelwyn Salisbury, MD  lactose free nutrition (BOOST) LIQD Take 237 mLs by mouth 3 (three) times daily between meals.    [provider]  losartan (COZAAR) 25 MG tablet Take 1 tablet (25 mg total) by mouth every evening. 11/14/22   Nelwyn Salisbury, MD  mirtazapine (REMERON) 15 MG tablet Take 1/2 (7.5 mg) tablet by mouth at bedtime Patient taking differently: Take 7.5 mg by mouth every evening. 11/14/22   Nelwyn Salisbury, MD  pantoprazole (PROTONIX) 40 MG tablet TAKE 1 TABLET BY MOUTH EVERY DAY 04/10/23   Nelwyn Salisbury, MD    Physical Exam: Vitals:   07/23/23 0515 07/23/23 0530 07/23/23 0545 07/23/23 0600  BP: (!) 143/66 (!) 149/66 (!) 148/68 (!) 148/70  Pulse: 100 (!) 102 (!) 102 (!) 101  Resp: 15 18 (!) 0 13  Temp:      SpO2: 96% 95% 95% 95%  Weight:      Height:       General: Pleasant, chronically ill-appearing elderly woman laying in bed. No acute distress. HEENT: [place]/AT. Anicteric sclera.  Dry mucous membrane. CV: Tachycardia. Irregularly irregular rhythm. No murmurs, rubs, or gallops. No LE edema Pulmonary: Lungs CTAB. Normal effort. No  wheezing or rales. Abdominal: Soft, nondistended. Mild tenderness to palpation of the RUQ. No guarding or rebound. Normal bowel sounds. Extremities: Palpable radial and DP pulses. Normal ROM. Skin: Warm and dry. No obvious rash or lesions. Neuro: A&Ox3. Moves all extremities. Normal sensation to light touch. No focal deficit. Psych: Depressed mood. Emotional and teary  Data Reviewed: Labs show WBC 11.7, Hgb 12.2, platelet 190, sodium 136, K+ 3.4, creatinine 1.06, albumin 2.9, AST/ALT 265/107, alk phos 213, bilirubin 1.6, lipase 38, troponin 16, lactic acid 3.2->3.2->2.1 Magnesium 1.4, phosphorus 1.9, TSH 0.9, vitamin D 56.46, vitamin B12 1251 Initial EKG shows sinus rhythm with LAFB but repeat EKGs shows sinus tach then A-fib with HR 131 CT A/P shows borderline thickening of the gallbladder with likely calculi RUQ U/S shows pericholecystic  fluid but no detected calculi or sonographic Murphy sign  Assessment and Plan: Christine Middleton is a 89 y.o. female with medical history significant of HTN, GERD, HLD, CVA, recent right hip fracture in August, paroxysmal A-fib, and IDA who presents to the ED for evaluation of right-sided abdominal pain with nausea and vomiting and admitted for cholecystitis.  # Cholecystitis Elderly patient presenting with right upper quadrant pain, nausea and vomiting and found to have mild thickening of the gallbladder and pericholecystic fluid on imaging but without any identifiable calculi. She is afebrile but has mild leukocytosis, transaminitis, elevated alk phos and bili. Findings likely secondary to acalculous cholecystitis. Patient NOT interested in surgery. Cholecystostomy tube will be an option however unclear if patient wants this. Lactic acid trending down s/p 2 L IVF.  -General Surgery following, appreciate recs -Continue IV vancomycin and Flagyl -Continue IV LR at 100 cc/h -As needed IV Dilaudid for pain -Trend CBC, fever curve, lactic acid -Palliative care  consulted to discuss goals of care  # Paroxysmal A-fib Patient with history of paroxysmal A-fib not on anticoagulation. She has been in normal sinus and initial EKG showed normal sinus however patient became tachycardic and transition into A-fib in the ED. Recent EKG shows A-fib with HR of 131. A-fib likely in the setting of acute abdominal infection and electrolyte abnormalities. Heart rate improved to the 80s with IV fluids and resuming home beta-blocker. -Telemetry -Continue atenolol  # Electrolyte abnormalities Patient with hypokalemia, hypophosphatemia and hypomagnesemia with K+ 3.4, Phos 1.9 and mag 1.4, respectively. -Repleting -Follow-up repeat K+, Phos and mag  # HTN Hypertensive on arrival but BP now stable with SBP in the 110s to 120s. -Continue losartan and atenolol  # GERD -Protonix  # Insomnia # Depression -Continue mirtazapine  # GOC conversation Patient very emotional during encounter. She reports a decline in health after recent hip surgery in August. She does not want any abdominal surgery and does not want anyone to cut her. States she prefers to go home to die peacefully.  She wants to be DNR. Spoke with daughter on the phone about plan to get palliative care involved for further discussion about her goals of care and possible assistance in the outpatient. -Palliative care consulted, appreciate assistance    Advance Care Planning:   Code Status: Limited: Do not attempt resuscitation (DNR) -DNR-LIMITED -Do Not Intubate/DNI    Consults: General Surgery, palliative care  Family Communication: Discussed admission with daughter on the phone  Severity of Illness: The appropriate patient status for this patient is INPATIENT. Inpatient status is judged to be reasonable and necessary in order to provide the required intensity of service to ensure the patient's safety. The patient's presenting symptoms, physical exam findings, and initial radiographic and laboratory data  in the context of their chronic comorbidities is felt to place them at high risk for further clinical deterioration. Furthermore, it is not anticipated that the patient will be medically stable for discharge from the hospital within 2 midnights of admission.   * I certify that at the point of admission it is my clinical judgment that the patient will require inpatient hospital care spanning beyond 2 midnights from the point of admission due to high intensity of service, high risk for further deterioration and high frequency of surveillance required.*  Author: Steffanie Rainwater, MD 07/23/2023 7:39 AM  For on call review [email or web address].

## 2023-07-23 NOTE — ED Notes (Signed)
Dr. Kirke Corin at bedside, made aware of PT's request for DNR, Dr. Kirke Corin stated that PT is already a DNR per PT records, and will amend our charts.

## 2023-07-24 ENCOUNTER — Inpatient Hospital Stay (HOSPITAL_COMMUNITY): Payer: Medicare Other

## 2023-07-24 ENCOUNTER — Encounter (HOSPITAL_COMMUNITY): Payer: Self-pay | Admitting: Student

## 2023-07-24 DIAGNOSIS — I480 Paroxysmal atrial fibrillation: Secondary | ICD-10-CM | POA: Diagnosis not present

## 2023-07-24 DIAGNOSIS — K625 Hemorrhage of anus and rectum: Secondary | ICD-10-CM | POA: Diagnosis not present

## 2023-07-24 DIAGNOSIS — K819 Cholecystitis, unspecified: Secondary | ICD-10-CM

## 2023-07-24 DIAGNOSIS — R7989 Other specified abnormal findings of blood chemistry: Secondary | ICD-10-CM

## 2023-07-24 DIAGNOSIS — D649 Anemia, unspecified: Secondary | ICD-10-CM

## 2023-07-24 DIAGNOSIS — Z515 Encounter for palliative care: Secondary | ICD-10-CM | POA: Diagnosis not present

## 2023-07-24 DIAGNOSIS — R1011 Right upper quadrant pain: Secondary | ICD-10-CM | POA: Diagnosis not present

## 2023-07-24 DIAGNOSIS — Z66 Do not resuscitate: Secondary | ICD-10-CM

## 2023-07-24 HISTORY — PX: IR PERC CHOLECYSTOSTOMY: IMG2326

## 2023-07-24 LAB — TYPE AND SCREEN
ABO/RH(D): O POS
Antibody Screen: NEGATIVE

## 2023-07-24 LAB — CBC
HCT: 31.9 % — ABNORMAL LOW (ref 36.0–46.0)
HCT: 30.4 % — ABNORMAL LOW (ref 36.0–46.0)
Hemoglobin: 10.5 g/dL — ABNORMAL LOW (ref 12.0–15.0)
Hemoglobin: 10.3 g/dL — ABNORMAL LOW (ref 12.0–15.0)
MCH: 30.1 pg (ref 26.0–34.0)
MCH: 30.2 pg (ref 26.0–34.0)
MCHC: 32.9 g/dL (ref 30.0–36.0)
MCHC: 33.9 g/dL (ref 30.0–36.0)
MCV: 91.4 fL (ref 80.0–100.0)
MCV: 89.1 fL (ref 80.0–100.0)
Platelets: 193 10*3/uL (ref 150–400)
Platelets: 193 10*3/uL (ref 150–400)
RBC: 3.49 MIL/uL — ABNORMAL LOW (ref 3.87–5.11)
RBC: 3.41 MIL/uL — ABNORMAL LOW (ref 3.87–5.11)
RDW: 13 % (ref 11.5–15.5)
RDW: 13 % (ref 11.5–15.5)
WBC: 12.2 10*3/uL — ABNORMAL HIGH (ref 4.0–10.5)
WBC: 12.2 10*3/uL — ABNORMAL HIGH (ref 4.0–10.5)
nRBC: 0 % (ref 0.0–0.2)
nRBC: 0 % (ref 0.0–0.2)

## 2023-07-24 LAB — COMPREHENSIVE METABOLIC PANEL
ALT: 124 U/L — ABNORMAL HIGH (ref 0–44)
AST: 135 U/L — ABNORMAL HIGH (ref 15–41)
Albumin: 2.5 g/dL — ABNORMAL LOW (ref 3.5–5.0)
Alkaline Phosphatase: 160 U/L — ABNORMAL HIGH (ref 38–126)
Anion gap: 9 (ref 5–15)
BUN: 12 mg/dL (ref 8–23)
CO2: 21 mmol/L — ABNORMAL LOW (ref 22–32)
Calcium: 8.1 mg/dL — ABNORMAL LOW (ref 8.9–10.3)
Chloride: 107 mmol/L (ref 98–111)
Creatinine, Ser: 1.03 mg/dL — ABNORMAL HIGH (ref 0.44–1.00)
GFR, Estimated: 49 mL/min — ABNORMAL LOW (ref >60)
Glucose, Bld: 111 mg/dL — ABNORMAL HIGH (ref 70–99)
Potassium: 4.1 mmol/L (ref 3.5–5.1)
Sodium: 137 mmol/L (ref 135–145)
Total Bilirubin: 4 mg/dL — ABNORMAL HIGH (ref <1.2)
Total Protein: 5.4 g/dL — ABNORMAL LOW (ref 6.5–8.1)

## 2023-07-24 LAB — PHOSPHORUS: Phosphorus: 4 mg/dL (ref 2.5–4.6)

## 2023-07-24 LAB — PROTIME-INR
INR: 1.2 (ref 0.8–1.2)
Prothrombin Time: 15.1 s (ref 11.4–15.2)

## 2023-07-24 LAB — MAGNESIUM: Magnesium: 2.1 mg/dL (ref 1.7–2.4)

## 2023-07-24 MED ORDER — HYDROCORTISONE ACETATE 25 MG RE SUPP
25.0000 mg | Freq: Two times a day (BID) | RECTAL | Status: DC
  Administered 2023-07-24 – 2023-07-27 (×8): 25 mg via RECTAL
  Filled 2023-07-24 (×10): qty 1

## 2023-07-24 MED ORDER — FENTANYL CITRATE (PF) 100 MCG/2ML IJ SOLN
INTRAMUSCULAR | Status: AC | PRN
  Administered 2023-07-24 (×2): 12.5 ug via INTRAVENOUS

## 2023-07-24 MED ORDER — MEPERIDINE HCL 25 MG/ML IJ SOLN
INTRAMUSCULAR | Status: AC | PRN
  Administered 2023-07-24: 12.5 mg via INTRAVENOUS

## 2023-07-24 MED ORDER — LIDOCAINE-EPINEPHRINE 1 %-1:100000 IJ SOLN
INTRAMUSCULAR | Status: AC
  Filled 2023-07-24: qty 1

## 2023-07-24 MED ORDER — MIDAZOLAM HCL 2 MG/2ML IJ SOLN
INTRAMUSCULAR | Status: AC
  Filled 2023-07-24: qty 2

## 2023-07-24 MED ORDER — MIDAZOLAM HCL 2 MG/2ML IJ SOLN
INTRAMUSCULAR | Status: AC | PRN
  Administered 2023-07-24: .5 mg via INTRAVENOUS

## 2023-07-24 MED ORDER — MEPERIDINE HCL 25 MG/ML IJ SOLN
INTRAMUSCULAR | Status: AC
  Filled 2023-07-24: qty 1

## 2023-07-24 MED ORDER — LIDOCAINE HCL (PF) 1 % IJ SOLN
20.0000 mL | Freq: Once | INTRAMUSCULAR | Status: DC
  Filled 2023-07-24: qty 20

## 2023-07-24 MED ORDER — IOHEXOL 300 MG/ML  SOLN
50.0000 mL | Freq: Once | INTRAMUSCULAR | Status: AC | PRN
  Administered 2023-07-24: 20 mL

## 2023-07-24 MED ORDER — FENTANYL CITRATE (PF) 100 MCG/2ML IJ SOLN
INTRAMUSCULAR | Status: AC
  Filled 2023-07-24: qty 2

## 2023-07-24 NOTE — Consult Note (Signed)
Consultation Note Date: 07/24/2023   Patient Name: Christine Middleton  DOB: 03/17/1923  MRN: [id number]  Age / Sex: 89 y.o., female  PCP: Nelwyn Salisbury, MD Referring Physician: Osvaldo Shipper, MD  Reason for Consultation: Establishing goals of care  HPI/Patient Profile: 89 y.o. female   admitted on 07/23/2023 with   medical history significant of HTN, GERD, HLD, CVA, recent right hip fracture in August, paroxysmal A-fib, and IDA who presents to the ED for evaluation of right-sided abdominal pain with nausea and vomiting.   Patient was in her usual state of health until last night when she felt sick around 10 PM with some right-sided abdominal pain. She had multiple emesis and 1 episode of watery stool but denies any hemoptysis. She also denies any bloody stools, dysuria, hematuria, fevers, chills, shortness of breath or chest pain.   According to medical records yesterday patient was verbalizing desire for no further workup and transitioning to a comfort approach allowing for natural death however today her goals of care have changed.  Patient and family face treatment option decision advanced directive decisions and anticipatory care needs     Clinical Assessment and Goals of Care:  This NP Lorinda Creed reviewed medical records, received report from team, assessed the patient and then meet at the patient's bedside  to discuss diagnosis, prognosis, GOC, EOL wishes disposition and options.  No family at bedside    Concept of Palliative Care was introduced as specialized medical care for people and their families living with serious illness.  If focuses on providing relief from the symptoms and stress of a serious illness.  The goal is to improve quality of life for both the patient and the family. Values and goals of care important to patient and family were attempted to be elicited.  Education offered on  patient's current medical situation, she acknowledges her advanced age, and reports intermittent rectal bleeding for many months.  She tells me that her PCP Dr. Clent Ridges has evaluated this.     She acknowledges that yesterday she was in the place of "just leave me alone and let me go" however today after further contemplation she is open to and hopeful for consideration for cholecystostomy drain for management of Colicystitis.  Surgical  consult in process.      I was able to speak to her son/Edward Snader, they live in the same home.  He is able to verify that his sister Valerie Salts is documented H POA, and reinforces that all 3 siblings work together to support their mother and help her navigate healthcare decisions.  Education offered today regarding  the importance of continued conversation with family and their  medical providers regarding overall plan of care and treatment options,  ensuring decisions are within the context of the patients values and GOCs.  It will be important to have f/u family meetings over the course of this hospitalization, decisions will be dependent on outcomes   PMT will continue to support holistically.    Questions and concerns  addressed.  Patient/family   encouraged to call with questions or concerns.      Patient does have documented [facility] and H POA naming her daughter/Caroline Clark.  SUMMARY OF RECOMMENDATIONS    Code Status/Advance Care Planning: Limited code   Symptom Management:  Per attending  Palliative Prophylaxis:  Delirium Protocol and Frequent Pain Assessment  Additional Recommendations (Limitations, Scope, Preferences): Have of  Psycho-social/Spiritual:  Desire for further Chaplaincy support: Of follow-up Additional Recommendations: Education on Hospice  Prognosis:  Unable to determine  Discharge Planning: To Be Determined      Primary Diagnoses: Present on Admission:  Cholecystitis   I have reviewed the medical  record, interviewed the patient and family, and examined the patient. The following aspects are pertinent.  Past Medical History:  Diagnosis Date   Blood transfusion without reported diagnosis    Diverticulitis    Esophageal stricture    GERD (gastroesophageal reflux disease)    Hyperlipidemia    Hypertension    Shingles    Stroke (HCC)    Vitamin B 12 deficiency    Social History   Socioeconomic History   Marital status: Widowed    Spouse name: Elmon Else. Gullo   Number of children: 4   Years of education: HS   Highest education level: 12th grade  Occupational History   Occupation: Retired    Associate Professor: RETIRED   Occupation: retired  Tobacco Use   Smoking status: Never   Smokeless tobacco: Never  Vaping Use   Vaping status: Never Used  Substance and Sexual Activity   Alcohol use: No    Alcohol/week: 0.0 standard drinks of alcohol   Drug use: No   Sexual activity: Not on file  Other Topics Concern   Not on file  Social History Narrative   Patient lives at home with family.   Patient is right handed.   Patient has a high school education.   Caffeine Use: 1 cup daily   Social Drivers of Health   Financial Resource Strain: Low Risk  (06/27/2023)   Overall Financial Resource Strain (CARDIA)    Difficulty of Paying Living Expenses: Not hard at all  Food Insecurity: No Food Insecurity (07/23/2023)   Hunger Vital Sign    Worried About Running Out of Food in the Last Year: Never true    Ran Out of Food in the Last Year: Never true  Transportation Needs: No Transportation Needs (07/23/2023)   PRAPARE - Administrator, Civil Service (Medical): No    Lack of Transportation (Non-Medical): No  Physical Activity: Insufficiently Active (06/27/2023)   Exercise Vital Sign    Days of Exercise per Week: 7 days    Minutes of Exercise per Session: 20 min  Stress: No Stress Concern Present (06/27/2023)   [employer] of Occupational Health - Occupational Stress  Questionnaire    Feeling of Stress : Not at all  Social Connections: Moderately Integrated (06/27/2023)   Social Connection and Isolation Panel [NHANES]    Frequency of Communication with Friends and Family: More than three times a week    Frequency of Social Gatherings with Friends and Family: More than three times a week    Attends Religious Services: More than 4 times per year    Active Member of [employer] or Organizations: Yes    Attends Banker Meetings: More than 4 times per year    Marital Status: Widowed   Family History  Problem Relation Age of Onset   Heart attack  Mother    Heart disease Father    Stroke Sister    Cancer Brother    Cancer Brother    Diabetes Brother    Breast cancer Other    Diabetes Other    Hypertension Other    Stroke Other    Heart disease Other    Colon cancer Neg Hx    Stomach cancer Neg Hx    Esophageal cancer Neg Hx    Scheduled Meds:  atenolol  50 mg Oral Daily   cholecalciferol  1,000 Units Oral QPM   docusate sodium  200 mg Oral BID   hydrocortisone  25 mg Rectal BID   lactose free nutrition  237 mL Oral TID BM   losartan  25 mg Oral QPM   mirtazapine  7.5 mg Oral QPM   pantoprazole  40 mg Oral Daily   Continuous Infusions:  cefTRIAXone (ROCEPHIN)  IV     metronidazole 500 mg (07/24/23 0850)   PRN Meds:.acetaminophen **OR** acetaminophen, HYDROmorphone (DILAUDID) injection, ondansetron **OR** ondansetron (ZOFRAN) IV, senna-docusate Medications Prior to Admission:  Prior to Admission medications   Medication Sig Start Date End Date Taking? Authorizing Provider  atenolol (TENORMIN) 50 MG tablet TAKE 1 TABLET BY MOUTH EVERY DAY 02/06/23  Yes Nelwyn Salisbury, MD  cholecalciferol (VITAMIN D3) 25 MCG (1000 UNIT) tablet Take 1,000 Units by mouth every evening.   Yes [provider]  docusate sodium (COLACE) 100 MG capsule Take 2 capsules (200 mg total) by mouth 2 (two) times daily. 12/23/22  Yes Leroy Sea, MD  Iron,  Ferrous Sulfate, 325 (65 Fe) MG TABS Take 325 mg by mouth daily. 01/10/23  Yes Nelwyn Salisbury, MD  losartan (COZAAR) 25 MG tablet Take 1 tablet (25 mg total) by mouth every evening. 11/14/22  Yes Nelwyn Salisbury, MD  mirtazapine (REMERON) 15 MG tablet Take 1/2 (7.5 mg) tablet by mouth at bedtime Patient taking differently: Take 7.5 mg by mouth every evening. 11/14/22  Yes Nelwyn Salisbury, MD  pantoprazole (PROTONIX) 40 MG tablet TAKE 1 TABLET BY MOUTH EVERY DAY 04/10/23  Yes Nelwyn Salisbury, MD  lactose free nutrition (BOOST) LIQD Take 237 mLs by mouth 3 (three) times daily between meals.    [provider]   Allergies  Allergen Reactions   Codeine Other (See Comments)    Unknown reaction   Lipitor [Atorvastatin] Other (See Comments)    Myalgias  Joint stiffness   Macrobid [Nitrofurantoin] Other (See Comments)    Unknown reaction   Zestril [Lisinopril] Other (See Comments)    Unknown reaction   Amoxil [Amoxicillin] Rash   Ultram [Tramadol] Anxiety and Other (See Comments)    Sedation Worsening depression   Review of Systems  Genitourinary:        Intermittent rectal bleeding "for quite some time"  Neurological:  Positive for weakness.    Physical Exam Constitutional:      Comments: Patient denies pain or discomfort currently  Cardiovascular:     Rate and Rhythm: Normal rate.  Pulmonary:     Effort: Pulmonary effort is normal.  Skin:    General: Skin is warm and dry.  Neurological:     Mental Status: She is alert and oriented to person, place, and time.     Vital Signs: BP (!) 150/67 (BP Location: Left Arm)   Pulse 72   Temp 98.5 F (36.9 C) (Oral)   Resp 18   Ht 5\' 2"  (1.575 m)   Wt 58.1  kg   SpO2 92%   BMI 23.43 kg/m  Pain Scale: 0-10   Pain Score: 0-No pain   SpO2: SpO2: 92 % O2 Device:SpO2: 92 % O2 Flow Rate: .O2 Flow Rate (L/min): 2 L/min  IO: Intake/output summary:  Intake/Output Summary (Last 24 hours) at 07/24/2023 1038 Last data filed at  07/24/2023 0300 Gross per 24 hour  Intake 350 ml  Output --  Net 350 ml    LBM: Last BM Date : 07/23/23 Baseline Weight: Weight: 58.1 kg Most recent weight: Weight: 58.1 kg     Palliative Assessment/Data:  40 %     PTA 70 %     Time:  75 minutes  Discussed with Dr. Rito Ehrlich  Signed by: Lorinda Creed, NP   Please contact Palliative Medicine Team phone at [phone] for questions and concerns.  For individual provider: See Loretha Stapler

## 2023-07-24 NOTE — Consult Note (Addendum)
Chief Complaint: Patient was seen in consultation today for perc chole placement  Chief Complaint  Patient presents with   Abdominal Pain   at the request of Trixie Deis PA-C  Referring Physician(s): Trixie Deis PA-C  Supervising Physician: Roanna Banning  Patient Status: [facility] - In-pt  History of Present Illness: Christine Middleton is a 89 y.o. female with PMHs of HTN, HLD, eso stricture, CVA, recent right hip fracture in August, paroxysmal A-fib, and IDA who presented to ED with RUQ pain and N/V, work up concerning for acute cholecystitis. IR was consulted for eprc chole placement.  In ED patient was hemodynamically stable, labs showed leukocytosis 11.7 and elevated  LFT. General surgery was consulted, patient was adamant that she does not want surgery. Repeat lab today showed worsening leukocytosis and LFT with T bili of 4 (1.6 yesterday,) perc chole placement was recommended to the patient which she decided to proceed. Case reviewed and approved by Dr. Milford Cage.   Patient seen in room, son at bedside.  Currently she has no complaints, denies abd pain, N/V, fever or chills.  At first she was hesitant to proceed with the procedure but ultimately provided consent for the perc chole placement.  She was notified that the tube will have to remain in place for at least 6 weeks, and there is a chance that it may need to stay in place indefinitely. Both patient and son verbalized understanding.   Patient currently has DNR order in place. Discussion with the patient and family regarding wishes.  The original DNR order is maintained and prior treatment limitations are upheld.   Past Medical History:  Diagnosis Date   Blood transfusion without reported diagnosis    Diverticulitis    Esophageal stricture    GERD (gastroesophageal reflux disease)    Hyperlipidemia    Hypertension    Shingles    Stroke (HCC)    Vitamin B 12 deficiency     Past Surgical History:  Procedure  Laterality Date   ABDOMINAL HYSTERECTOMY     APPENDECTOMY     COLONOSCOPY  12-09-08   per Dr. Jarold Motto (incomplete along with barium enema 12-10-08) with hemorrhoids  and diverticulae   COLONOSCOPY WITH PROPOFOL N/A 07/10/2019   Procedure: COLONOSCOPY WITH PROPOFOL;  Surgeon: Lemar Lofty., MD;  Location: [facility];  Service: Gastroenterology;  Laterality: N/A;   ESOPHAGOGASTRODUODENOSCOPY  09-2004   per Dr. Jarold Motto with dilatation    EYE SURGERY     HEMOSTASIS CLIP PLACEMENT  07/10/2019   Procedure: HEMOSTASIS CLIP PLACEMENT;  Surgeon: Lemar Lofty., MD;  Location: [facility];  Service: Gastroenterology;;   INTRAMEDULLARY (IM) NAIL INTERTROCHANTERIC Right 03/17/2023   Procedure: INTRAMEDULLARY (IM) NAIL INTERTROCHANTERIC;  Surgeon: London Sheer, MD;  Location: [facility] OR;  Service: Orthopedics;  Laterality: Right;   POLYPECTOMY  07/10/2019   Procedure: POLYPECTOMY;  Surgeon: Mansouraty, Netty Starring., MD;  Location: [facility];  Service: Gastroenterology;;   TONSILLECTOMY      Allergies: Codeine, Lipitor [atorvastatin], Macrobid [nitrofurantoin], Zestril [lisinopril], Amoxil [amoxicillin], and Ultram [tramadol]  Medications: Prior to Admission medications   Medication Sig Start Date End Date Taking? Authorizing Provider  atenolol (TENORMIN) 50 MG tablet TAKE 1 TABLET BY MOUTH EVERY DAY 02/06/23  Yes Nelwyn Salisbury, MD  cholecalciferol (VITAMIN D3) 25 MCG (1000 UNIT) tablet Take 1,000 Units by mouth every evening.   Yes [provider]  docusate sodium (COLACE) 100 MG capsule Take 2 capsules (200 mg total) by mouth 2 (two) times  daily. 12/23/22  Yes Leroy Sea, MD  Iron, Ferrous Sulfate, 325 (65 Fe) MG TABS Take 325 mg by mouth daily. 01/10/23  Yes Nelwyn Salisbury, MD  losartan (COZAAR) 25 MG tablet Take 1 tablet (25 mg total) by mouth every evening. 11/14/22  Yes Nelwyn Salisbury, MD  mirtazapine (REMERON) 15 MG tablet Take 1/2 (7.5 mg) tablet by mouth at  bedtime Patient taking differently: Take 7.5 mg by mouth every evening. 11/14/22  Yes Nelwyn Salisbury, MD  pantoprazole (PROTONIX) 40 MG tablet TAKE 1 TABLET BY MOUTH EVERY DAY 04/10/23  Yes Nelwyn Salisbury, MD  lactose free nutrition (BOOST) LIQD Take 237 mLs by mouth 3 (three) times daily between meals.    [provider]     Family History  Problem Relation Age of Onset   Heart attack Mother    Heart disease Father    Stroke Sister    Cancer Brother    Cancer Brother    Diabetes Brother    Breast cancer Other    Diabetes Other    Hypertension Other    Stroke Other    Heart disease Other    Colon cancer Neg Hx    Stomach cancer Neg Hx    Esophageal cancer Neg Hx     Social History   Socioeconomic History   Marital status: Widowed    Spouse name: Elmon Else. Baldonado   Number of children: 4   Years of education: HS   Highest education level: 12th grade  Occupational History   Occupation: Retired    Associate Professor: RETIRED   Occupation: retired  Tobacco Use   Smoking status: Never   Smokeless tobacco: Never  Vaping Use   Vaping status: Never Used  Substance and Sexual Activity   Alcohol use: No    Alcohol/week: 0.0 standard drinks of alcohol   Drug use: No   Sexual activity: Not on file  Other Topics Concern   Not on file  Social History Narrative   Patient lives at home with family.   Patient is right handed.   Patient has a high school education.   Caffeine Use: 1 cup daily   Social Drivers of Health   Financial Resource Strain: Low Risk  (06/27/2023)   Overall Financial Resource Strain (CARDIA)    Difficulty of Paying Living Expenses: Not hard at all  Food Insecurity: No Food Insecurity (07/23/2023)   Hunger Vital Sign    Worried About Running Out of Food in the Last Year: Never true    Ran Out of Food in the Last Year: Never true  Transportation Needs: No Transportation Needs (07/23/2023)   PRAPARE - Administrator, Civil Service (Medical): No     Lack of Transportation (Non-Medical): No  Physical Activity: Insufficiently Active (06/27/2023)   Exercise Vital Sign    Days of Exercise per Week: 7 days    Minutes of Exercise per Session: 20 min  Stress: No Stress Concern Present (06/27/2023)   [employer] of Occupational Health - Occupational Stress Questionnaire    Feeling of Stress : Not at all  Social Connections: Moderately Integrated (06/27/2023)   Social Connection and Isolation Panel [NHANES]    Frequency of Communication with Friends and Family: More than three times a week    Frequency of Social Gatherings with Friends and Family: More than three times a week    Attends Religious Services: More than 4 times per year    Active Member of  Clubs or Organizations: Yes    Attends Banker Meetings: More than 4 times per year    Marital Status: Widowed     Review of Systems: A 12 point ROS discussed and pertinent positives are indicated in the HPI above.  All other systems are negative.  Vital Signs: BP (!) 150/67 (BP Location: Left Arm)   Pulse 72   Temp 98.5 F (36.9 C) (Oral)   Resp 18   Ht 5\' 2"  (1.575 m)   Wt 128 lb 1.4 oz (58.1 kg)   SpO2 92%   BMI 23.43 kg/m    Physical Exam Vitals and nursing note reviewed.  Constitutional:      General: Patient is not in acute distress.    Appearance: Normal appearance. Patient is not ill-appearing.  HENT:     Head: Normocephalic and atraumatic.     Mouth/Throat:     Mouth: Mucous membranes are moist.     Pharynx: Oropharynx is clear.  Cardiovascular:     Rate and Rhythm: Normal rate and regular rhythm.     Pulses: Normal pulses.     Heart sounds: Normal heart sounds.  Pulmonary:     Effort: Pulmonary effort is normal.     Breath sounds: Normal breath sounds.  Abdominal:     General: Abdomen is flat. Bowel sounds are normal.     Palpations: Abdomen is soft.  Musculoskeletal:     Cervical back: Neck supple.  Skin:    General: Skin is warm  and dry.     Coloration: Skin is not jaundiced or pale.  Neurological:     Mental Status: Patient is alert and oriented to person, place, and time.  Psychiatric:        Mood and Affect: Mood normal.        Behavior: Behavior normal.        Judgment: Judgment normal.    MD Evaluation Airway: WNL Heart: WNL Abdomen: WNL Chest/ Lungs: WNL ASA  Classification: 3 Mallampati/Airway Score: Two  Imaging: US Abdomen Limited RUQ (LIVER/GB) Result Date: 07/23/2023 CLINICAL DATA:  Right upper quadrant pain EXAM: ULTRASOUND ABDOMEN LIMITED RIGHT UPPER QUADRANT COMPARISON:  Abdominal CT from earlier today FINDINGS: Gallbladder: There is pericholecystic fluid but no focal tenderness or detected calculi. Common bile duct: Diameter: 5 mm Liver: Solitary, known partially shadowing mass in the right lobe liver, present for multiple years by CT, favoring a benign process such as hyalinized hemangioma. Portal vein is patent on color Doppler imaging with normal direction of blood flow towards the liver. IMPRESSION: Pericholecystic fluid, presumably reactive as no detected calculi or sonographic Murphy sign. Electronically Signed   By: Tiburcio Pea M.D.   On: 07/23/2023 07:44   CT ABDOMEN PELVIS W CONTRAST Result Date: 07/23/2023 CLINICAL DATA:  Right-sided abdominal pain with nausea and vomiting that started tonight. EXAM: CT ABDOMEN AND PELVIS WITH CONTRAST TECHNIQUE: Multidetector CT imaging of the abdomen and pelvis was performed using the standard protocol following bolus administration of intravenous contrast. RADIATION DOSE REDUCTION: This exam was performed according to the departmental dose-optimization program which includes automated exposure control, adjustment of the mA and/or kV according to patient size and/or use of iterative reconstruction technique. CONTRAST:  60mL OMNIPAQUE IOHEXOL 350 MG/ML SOLN COMPARISON:  12/19/2022 FINDINGS: Lower chest: Borderline interlobular septal thickening at the  lung bases. Stable mild GE junction esophageal thickening favoring hiatal hernia rather than acute inflammation. Hepatobiliary: Dystrophic calcification within a 17 mm right liver mass, possibly hyalinized hemangioma given  multiyear stability, still stable.Indistinct gallbladder wall with possible faint calcification of internal calculi. No biliary dilatation or calcified choledocholithiasis. Pancreas: Unremarkable. Spleen: Lobulation of the capsule attributed to scarring. Adrenals/Urinary Tract: Negative adrenals. No hydronephrosis or stone. Left renal cortical thinning which is patchy in from scarring. Milder right renal cortical scarring. Unremarkable bladder. Stomach/Bowel: No obstruction. No visible bowel inflammation. Distal colonic diverticulosis and moderate stool. Vascular/Lymphatic: No acute vascular abnormality. Extensive atheromatous calcification of the aorta and iliacs. No mass or adenopathy. Reproductive:Hysterectomy. Other: No ascites or pneumoperitoneum. Musculoskeletal: Proximal right femur fracture repair without complicating features. L1-L3 remote compression fractures with advanced height loss at L1 where there has been interval healing of the fracture cleft containing gas. IMPRESSION: 1. Borderline thickening of the gallbladder which could contain calculi, correlate for cholecystitis symptoms. 2. Chronic findings are stable from prior and described above. Electronically Signed   By: Tiburcio Pea M.D.   On: 07/23/2023 06:44   XR FEMUR, MIN 2 VIEWS RIGHT Result Date: 07/05/2023 X-rays of the right femur taken 07/05/2023 were independently reviewed and interpreted, showing cephalomedullary rod in appropriate position.  Fracture is reduced.  Lag screws are well centered within the femoral head.  No lucency seen around the screws.  No new fracture noted.   Labs:  CBC: Recent Labs    04/16/23 1444 07/23/23 0402 07/24/23 0434 07/24/23 1138  WBC 6.3 11.7* 12.2* 12.2*  HGB 10.1* 12.2  10.5* 10.3*  HCT 31.5* 37.6 31.9* 30.4*  PLT 283.0 190 193 193    COAGS: Recent Labs    12/19/22 1922 03/15/23 2248 03/16/23 1424 03/17/23 0154 07/24/23 1138  INR 1.1 1.3*  --   --  1.2  APTT  --   --  32 106*  --     BMP: Recent Labs    03/19/23 0323 03/26/23 1311 07/23/23 0402 07/24/23 0434  NA 137 139 136 137  K 3.7 4.0 3.4* 4.1  CL 107 106 101 107  CO2 21* 24 23 21*  GLUCOSE 107* 114* 119* 111*  BUN 13 14 11 12   CALCIUM 8.5* 9.1 8.7* 8.1*  CREATININE 0.76 1.12* 1.06* 1.03*  GFRNONAA >60 44* 47* 49*    LIVER FUNCTION TESTS: Recent Labs    12/19/22 1922 03/26/23 1311 07/23/23 0402 07/24/23 0434  BILITOT 0.8 1.0 1.6* 4.0*  AST 19 21 265* 135*  ALT 15 16 107* 124*  ALKPHOS 95 150* 213* 160*  PROT 6.4* 6.5 6.3* 5.4*  ALBUMIN 3.3* 3.0* 2.9* 2.5*    TUMOR MARKERS: No results for input(s): "AFPTM", "CEA", "CA199", "CHROMGRNA" in the last 8760 hours.  Assessment and Plan: 89 y.o. female with possible acute cholecystitis who presents for perc chole placement.   NPO since MN VSS Hgb, plt, INR all w/n acceptable range for the procedure  Allergies reviewed On rocephin 2 g every day and Flagy 500 mg q12 hrs, given 1127 hrs/ 0850 hrs today  Risks and benefits discussed with the patient including, but not limited to bleeding, infection, gallbladder perforation, bile leak, sepsis or even death.  All of the patient's questions were answered, patient is agreeable to proceed. Consent signed and in chart.   Patient currently has DNR order in place. Discussion with the patient and family regarding wishes.  The original DNR order is maintained and prior treatment limitations are upheld.  Thank you for this interesting consult.  I greatly enjoyed meeting Diamon G Dowlen and look forward to participating in their care.  A copy of this report was  sent to the requesting provider on this date.  Electronically Signed: Willette Brace, PA-C 07/24/2023, 12:20 PM   I  spent a total of 40 Minutes    in face to face in clinical consultation, greater than 50% of which was counseling/coordinating care for perc chole placement.   This chart was dictated using voice recognition software.  Despite best efforts to proofread,  errors can occur which can change the documentation meaning.

## 2023-07-24 NOTE — Progress Notes (Signed)
This chaplain is responding to the RN's consult for creating/updating the Pt. Advance Directive. The chaplain reviewed the chart and received an update from PMT NP-Mary.  This chaplain understands the Pt. has ACP documents in Epic identifying a HCPOA as Suzan Garibaldi. This chaplain will coordinate with PMT for the next steps of ACP.  This chaplain is available for F/U spiritual care as needed.  Chaplain Stephanie Acre [phone]

## 2023-07-24 NOTE — Progress Notes (Signed)
[facility] PROGRESS NOTE   Christine Middleton NWG:[id number] DOB: 03/09/1923 DOA: 07/23/2023  PCP: Nelwyn Salisbury, MD  Brief History:  89 y.o. female with medical history significant of HTN, GERD, HLD, CVA, recent right hip fracture in August, paroxysmal A-fib, who presented to the ED for evaluation of right-sided abdominal pain with nausea and vomiting.  Concern was for cholecystitis.  She was hospitalized for further management.  Initially she was reluctant to undergo any kind of procedures.  She also had 1 episode of rectal bleeding.    Consultants: General surgery.  Palliative care  Procedures: None yet    Subjective/Interval History: Patient denies any abdominal pain.  She was initially a little confused about where she was but then she realized she was in the hospital.  Mentions that she gets rectal bleeding on and off.  She has known hemorrhoids.    Assessment/Plan:  Acute cholecystitis Presented with abdominal pain and was found to have abnormal LFTs.  Imaging studies raise concern for cholecystitis.  General surgery was consulted.  Patient was initially reluctant to undergo any kind of procedures.  This morning's he seems to be a little bit more amenable to having discussions about it. Noted to be on ceftriaxone and metronidazole. Not particularly tender in the right upper quadrant.  LFTs noted to be abnormal with elevated bilirubin level.  Alkaline phosphatase level is also elevated. CBD was noted to be normal size on ultrasound.  No alcohol bladder stones noted on ultrasound.  Hematochezia Had bright red blood per rectum overnight.  Possibly due to hemorrhoids seen on colonoscopy done in 2020.  Will use hydrocortisone suppositories.  Hemoglobin noted to be lower today.  Continue to monitor.  Normocytic anemia Drop in hemoglobin likely combination of dilution as well as hematochezia.  Continue to monitor.  History of paroxysmal atrial fibrillation She is not on  anticoagulation.  Initially noted to be in RVR which has improved with IV hydration.  Continue with beta-blocker.  Hypokalemia/hypomagnesemia Supplemented.  Magnesium level is 2.1.  Essential hypertension Continue home medications.  History of GERD Continue PPI  Insomnia and depression Continue mirtazapine.  Goals of care Palliative care to see.  DVT Prophylaxis: SCDs Code Status: DNR Family Communication: Discussed with patient.  No family at bedside Disposition Plan: To be determined  Status is: Inpatient Remains inpatient appropriate because: Cholecystitis, hematochezia      Medications: Scheduled:  atenolol  50 mg Oral Daily   cholecalciferol  1,000 Units Oral QPM   docusate sodium  200 mg Oral BID   influenza vaccine adjuvanted  0.5 mL Intramuscular Tomorrow-1000   lactose free nutrition  237 mL Oral TID BM   losartan  25 mg Oral QPM   mirtazapine  7.5 mg Oral QPM   pantoprazole  40 mg Oral Daily   Continuous:  cefTRIAXone (ROCEPHIN)  IV     metronidazole 500 mg (07/23/23 2016)   VHQ:[id number] **OR** acetaminophen, HYDROmorphone (DILAUDID) injection, ondansetron **OR** ondansetron (ZOFRAN) IV, senna-docusate  Antibiotics: Anti-infectives (From admission, onward)    Start     Dose/Rate Route Frequency Ordered Stop   07/24/23 1000  cefTRIAXone (ROCEPHIN) 2 g in sodium chloride 0.9 % 100 mL IVPB        2 g 200 mL/hr over 30 Minutes Intravenous Every 24 hours 07/23/23 1451     07/23/23 2000  metroNIDAZOLE (FLAGYL) IVPB 500 mg        500 mg 100 mL/hr over 60 Minutes Intravenous Every 12 hours 07/23/23  1451     07/23/23 0700  cefTRIAXone (ROCEPHIN) 1 g in sodium chloride 0.9 % 100 mL IVPB        1 g 200 mL/hr over 30 Minutes Intravenous  Once 07/23/23 0648 07/23/23 0741   07/23/23 0700  metroNIDAZOLE (FLAGYL) IVPB 500 mg        500 mg 100 mL/hr over 60 Minutes Intravenous  Once 07/23/23 0648 07/23/23 0741       Objective:  Vital Signs  Vitals:    07/24/23 0100 07/24/23 0200 07/24/23 0300 07/24/23 0400  BP:    (!) 143/71  Pulse: 76 78 79 86  Resp: (!) 26 (!) 22 20 (!) 22  Temp:    97.9 F (36.6 C)  TempSrc:    Oral  SpO2: (!) 86% (!) 86% 90% 92%  Weight:      Height:        Intake/Output Summary (Last 24 hours) at 07/24/2023 0835 Last data filed at 07/24/2023 0300 Gross per 24 hour  Intake 350 ml  Output --  Net 350 ml   Filed Weights   07/23/23 0353  Weight: 58.1 kg    General appearance: Awake alert.  In no distress.  Mildly distracted Resp: Clear to auscultation bilaterally.  Normal effort Cardio: S1-S2 is normal regular.  No S3-S4.  No rubs murmurs or bruit GI: Abdomen is soft.  Nontender nondistended.  Bowel sounds are present normal.  No masses organomegaly Extremities: No edema.  Full range of motion of lower extremities. Neurologic:   No focal neurological deficits.    Lab Results:  Data Reviewed: I have personally reviewed following labs and reports of the imaging studies  CBC: Recent Labs  Lab 07/23/23 0402 07/24/23 0434  WBC 11.7* 12.2*  NEUTROABS 10.9*  --   HGB 12.2 10.5*  HCT 37.6 31.9*  MCV 91.7 91.4  PLT 190 193    Basic Metabolic Panel: Recent Labs  Lab 07/23/23 0402 07/23/23 1123 07/24/23 0434  NA 136  --  137  K 3.4*  --  4.1  CL 101  --  107  CO2 23  --  21*  GLUCOSE 119*  --  111*  BUN 11  --  12  CREATININE 1.06*  --  1.03*  CALCIUM 8.7*  --  8.1*  MG  --  1.4* 2.1  PHOS  --  1.9* 4.0    GFR: Estimated Creatinine Clearance: 24.1 mL/min (A) (by C-G formula based on SCr of 1.03 mg/dL (H)).  Liver Function Tests: Recent Labs  Lab 07/23/23 0402 07/24/23 0434  AST 265* 135*  ALT 107* 124*  ALKPHOS 213* 160*  BILITOT 1.6* 4.0*  PROT 6.3* 5.4*  ALBUMIN 2.9* 2.5*    Recent Labs  Lab 07/23/23 0402  LIPASE 38     Thyroid Function Tests: Recent Labs    07/23/23 1123  TSH 0.887    Anemia Panel: Recent Labs    07/23/23 1123  VITAMINB12 1,251*      Radiology Studies: US Abdomen Limited RUQ (LIVER/GB) Result Date: 07/23/2023 CLINICAL DATA:  Right upper quadrant pain EXAM: ULTRASOUND ABDOMEN LIMITED RIGHT UPPER QUADRANT COMPARISON:  Abdominal CT from earlier today FINDINGS: Gallbladder: There is pericholecystic fluid but no focal tenderness or detected calculi. Common bile duct: Diameter: 5 mm Liver: Solitary, known partially shadowing mass in the right lobe liver, present for multiple years by CT, favoring a benign process such as hyalinized hemangioma. Portal vein is patent on color Doppler imaging with normal direction  of blood flow towards the liver. IMPRESSION: Pericholecystic fluid, presumably reactive as no detected calculi or sonographic Murphy sign. Electronically Signed   By: Tiburcio Pea M.D.   On: 07/23/2023 07:44   CT ABDOMEN PELVIS W CONTRAST Result Date: 07/23/2023 CLINICAL DATA:  Right-sided abdominal pain with nausea and vomiting that started tonight. EXAM: CT ABDOMEN AND PELVIS WITH CONTRAST TECHNIQUE: Multidetector CT imaging of the abdomen and pelvis was performed using the standard protocol following bolus administration of intravenous contrast. RADIATION DOSE REDUCTION: This exam was performed according to the departmental dose-optimization program which includes automated exposure control, adjustment of the mA and/or kV according to patient size and/or use of iterative reconstruction technique. CONTRAST:  60mL OMNIPAQUE IOHEXOL 350 MG/ML SOLN COMPARISON:  12/19/2022 FINDINGS: Lower chest: Borderline interlobular septal thickening at the lung bases. Stable mild GE junction esophageal thickening favoring hiatal hernia rather than acute inflammation. Hepatobiliary: Dystrophic calcification within a 17 mm right liver mass, possibly hyalinized hemangioma given multiyear stability, still stable.Indistinct gallbladder wall with possible faint calcification of internal calculi. No biliary dilatation or calcified  choledocholithiasis. Pancreas: Unremarkable. Spleen: Lobulation of the capsule attributed to scarring. Adrenals/Urinary Tract: Negative adrenals. No hydronephrosis or stone. Left renal cortical thinning which is patchy in from scarring. Milder right renal cortical scarring. Unremarkable bladder. Stomach/Bowel: No obstruction. No visible bowel inflammation. Distal colonic diverticulosis and moderate stool. Vascular/Lymphatic: No acute vascular abnormality. Extensive atheromatous calcification of the aorta and iliacs. No mass or adenopathy. Reproductive:Hysterectomy. Other: No ascites or pneumoperitoneum. Musculoskeletal: Proximal right femur fracture repair without complicating features. L1-L3 remote compression fractures with advanced height loss at L1 where there has been interval healing of the fracture cleft containing gas. IMPRESSION: 1. Borderline thickening of the gallbladder which could contain calculi, correlate for cholecystitis symptoms. 2. Chronic findings are stable from prior and described above. Electronically Signed   By: Tiburcio Pea M.D.   On: 07/23/2023 06:44       LOS: 1 day   Osvaldo Shipper  [facility] Pager on [email or web address]  07/24/2023, 8:35 AM

## 2023-07-24 NOTE — Progress Notes (Signed)
Patient had large bright red blood per rectum in the toilet bowl. I would estimate 50-138mls blood loss. No active bleeding per rectum noticed post void. Patient claims its not a new event for her as it comes and goes, she also noted she hasn't had it for some months now. On-call MD aware, Labs ordered. Vitals WDLs at this time.   07/24/23 0400  Vitals  Temp 97.9 F (36.6 C)  Temp Source Oral  BP (!) 143/71  MAP (mmHg) 91  BP Location Left Arm  BP Method Automatic  Patient Position (if appropriate) Lying  Pulse Rate 86  Pulse Rate Source Monitor  ECG Heart Rate 86  Resp (!) 22

## 2023-07-24 NOTE — Procedures (Signed)
Vascular and Interventional Radiology Procedure Note  Patient: Christine Middleton DOB: 1922-11-29 Medical Record Number: [id number] Note Date/Time: 07/24/23 2:22 PM   Performing Physician: Roanna Banning, MD Assistant(s): None  Diagnosis: Acute cholecystitis  Procedure:  CHOLECYSTOSTOMY TUBE PLACEMENT ANTEROGRADE CHOLANGIOGRAM  Anesthesia: Conscious Sedation Complications: None Estimated Blood Loss: Minimal Specimens:  None  Findings:  Successful placement of 36F cholecystostomy tube.  Plan: Flush tube w 5 mL sterile NS q8h and record drain output qShift. Follow up for routine tube evaluation in 8 week(s).   See detailed procedure note with images in PACS. The patient tolerated the procedure well without incident or complication and was returned to Recovery in stable condition.    Roanna Banning, MD [facility]   Pager. [phone] Clinic. [phone]

## 2023-07-24 NOTE — Significant Event (Addendum)
[facility] night coverage note:  Per RN 50-100 cc BRBPR, first during this admission.  Pt states this "comes and goes" though hasn't had in "a couple of months" now.  Draw AM labs (CBC) DCd DVT ppx lovenox Adding type and screen to AM lab draw.

## 2023-07-24 NOTE — Evaluation (Signed)
Occupational Therapy Evaluation Patient Details Name: Christine Middleton MRN: [id number] DOB: 02-Oct-1922 Today's Date: 07/24/2023   History of Present Illness The pt is a 89 yo female presenting to [facility] on 07/23/23 for evaluation of R sided abdominal pain with nausea and vomiting. Pt found to have Cholecystitis and is s/p CHOLECYSTOSTOMY TUBE PLACEMENT.  PMH includes: diverticulitis, GERD, HTN, HLD, CVA, R hip nailing (8/24) and afib on Eliquis.   Clinical Impression   Pt admitted for above, following her R flank drain placement she reports pain at the operative site. Op site guarding and pain causing her to need increased assist with LB ADLs, pt was able to ambulate to bathroom with CGA but needs Min A for transfer assist to low surfaces. She reports living with her son and has a good amount of DME and support from him if needed. OT to continue following pt acutely to address deficits and help transition to next level of care. Patient would benefit from post acute Home OT services to help maximize functional independence in natural environment       If plan is discharge home, recommend the following: A little help with bathing/dressing/bathroom;Assistance with cooking/housework    Functional Status Assessment  Patient has had a recent decline in their functional status and demonstrates the ability to make significant improvements in function in a reasonable and predictable amount of time.  Equipment Recommendations  None recommended by OT (pt has rec DME)    Recommendations for Other Services       Precautions / Restrictions Precautions Precautions: Fall Precaution Comments: Christine Middleton drain Restrictions Weight Bearing Restrictions Per Provider Order: No      Mobility Bed Mobility               General bed mobility comments: pt EOB on arrival    Transfers Overall transfer level: Needs assistance Equipment used: Rolling walker (2 wheels) Transfers: Sit to/from Stand Sit to Stand:  Contact guard assist                  Balance Overall balance assessment: Needs assistance Sitting-balance support: No upper extremity supported, Feet supported Sitting balance-Leahy Scale: Fair Sitting balance - Comments: sitting EOB   Standing balance support: During functional activity, Reliant on assistive device for balance, Bilateral upper extremity supported Standing balance-Leahy Scale: Poor Standing balance comment: RW                           ADL either performed or assessed with clinical judgement   ADL Overall ADL's : Needs assistance/impaired Eating/Feeding: Independent;Sitting   Grooming: Standing;Wash/dry hands;Contact guard assist   Upper Body Bathing: Sitting;Set up   Lower Body Bathing: Sitting/lateral leans;Moderate assistance Lower Body Bathing Details (indicate cue type and reason): limited by pain Upper Body Dressing : Sitting;Set up   Lower Body Dressing: Moderate assistance;Sitting/lateral leans Lower Body Dressing Details (indicate cue type and reason): limited by pain Toilet Transfer: Minimal assistance;Rolling walker (2 wheels);Ambulation Toilet Transfer Details (indicate cue type and reason): to low toilet in bathroom, assist to guide hips down Toileting- Clothing Manipulation and Hygiene: Sitting/lateral lean;Contact guard assist       Functional mobility during ADLs: Contact guard assist;Rolling walker (2 wheels)       Vision         Perception         Praxis         Pertinent Vitals/Pain Pain Assessment Pain Assessment: Faces Faces Pain Scale:  Hurts even more Pain Location: R abdomen, drain site Pain Descriptors / Indicators: Aching, Sore, Grimacing, Guarding Pain Intervention(s): Limited activity within patient's tolerance, Monitored during session, RN gave pain meds during session     Extremity/Trunk Assessment Upper Extremity Assessment Upper Extremity Assessment: Generalized weakness   Lower Extremity  Assessment Lower Extremity Assessment: Defer to PT evaluation       Communication Communication Communication: Hearing impairment (has hearing aides) Cueing Techniques: Verbal cues;Tactile cues   Cognition Arousal: Alert Behavior During Therapy: WFL for tasks assessed/performed Overall Cognitive Status: Within Functional Limits for tasks assessed                                 General Comments: Pt HOH but follows commands well once repeated     General Comments  VSS on 3L, Sp02 sitting at 93%    Exercises     Shoulder Instructions      Home Living Family/patient expects to be discharged to:: Private residence Living Arrangements: Children (son) Available Help at Discharge: Family;Available 24 hours/day Type of Home: House Home Access: Ramped entrance     Home Layout: Multi-level;Able to live on main level with bedroom/bathroom     Bathroom Shower/Tub: Tub/shower unit;Walk-in shower   Bathroom Toilet: Handicapped height     Home Equipment: Agricultural consultant (2 wheels);Rollator (4 wheels);BSC/3in1;Cane - single point;Tub bench;Wheelchair - manual   Additional Comments: pt lives with son who she reports is retired. Notes from 4 months ago state the pt's son was working part time.      Prior Functioning/Environment Prior Level of Function : Independent/Modified Independent             Mobility Comments: Ind with RW ADLs Comments: Ind per pt report        OT Problem List: Decreased strength;Impaired balance (sitting and/or standing);Pain      OT Treatment/Interventions: Self-care/ADL training;Therapeutic exercise;Therapeutic activities;DME and/or AE instruction;Balance training;Patient/family education    OT Goals(Current goals can be found in the care plan section) Acute Rehab OT Goals Patient Stated Goal: To get better; personal goal to see her 89th birthday OT Goal Formulation: With patient Time For Goal Achievement: 08/07/23 Potential to  Achieve Goals: Good ADL Goals Pt Will Perform Grooming: with supervision;standing Pt Will Perform Lower Body Bathing: with supervision;sitting/lateral leans Pt Will Perform Lower Body Dressing: with supervision;sit to/from stand Pt Will Transfer to Toilet: with supervision;ambulating  OT Frequency: Min 1X/week    Co-evaluation              AM-PAC OT "6 Clicks" Daily Activity     Outcome Measure Help from another person eating meals?: None Help from another person taking care of personal grooming?: A Little Help from another person toileting, which includes using toliet, bedpan, or urinal?: A Little Help from another person bathing (including washing, rinsing, drying)?: A Lot Help from another person to put on and taking off regular upper body clothing?: A Little Help from another person to put on and taking off regular lower body clothing?: A Lot 6 Click Score: 17   End of Session Equipment Utilized During Treatment: Gait belt;Rolling walker (2 wheels) Nurse Communication: Mobility status  Activity Tolerance: Patient tolerated treatment well Patient left: in bed;with call bell/phone within reach;with bed alarm set  OT Visit Diagnosis: Unsteadiness on feet (R26.81);Pain Pain - Right/Left:  (abdominal(R))  Time: 1610-9604 OT Time Calculation (min): 19 min Charges:  OT General Charges $OT Visit: 1 Visit OT Evaluation $OT Eval Low Complexity: 1 Low  07/24/2023  AB, OTR/L  Acute Rehabilitation Services  Office: [phone]   Tristan Schroeder 07/24/2023, 6:05 PM

## 2023-07-24 NOTE — Progress Notes (Signed)
Progress Note     Subjective: Pt reports she is overall feeling better than she was when she came in. Denies pain at rest. Nausea and vomiting improved but poor appetite. She asks about having a drain placed into the gallbladder and states she would be willing to consider that but is still against having surgery at this time. She understands that a cholecystostomy is not definitive management for gallbladder disease but is hopeful it may help her temporarily. She hopes to make it to her 89th birthday on [birth year]. No family at bedside this AM but reports that her son and daughter both help her at home. She had some BRBPR this AM but reports that happens at home intermittently as well. She assumes it is hemorrhoidal bleeding.   Objective: Vital signs in last 24 hours: Temp:  [97.9 F (36.6 C)-98.3 F (36.8 C)] 97.9 F (36.6 C) (12/24 0400) Pulse Rate:  [76-153] 86 (12/24 0400) Resp:  [16-28] 22 (12/24 0400) BP: (115-143)/(57-85) 143/71 (12/24 0400) SpO2:  [86 %-100 %] 92 % (12/24 0400) Last BM Date : 07/23/23  Intake/Output from previous day: 12/23 0701 - 12/24 0700 In: 350 [P.O.:250; IV Piggyback:100] Out: -  Intake/Output this shift: No intake/output data recorded.  PE: General: pleasant, WD, elderly female who is laying in bed in NAD HEENT: sclera anicteric Heart: regular, rate, and rhythm.   Lungs: Respiratory effort nonlabored Abd: soft, mild ttp of RUQ, ND, no masses, hernias, or organomegaly Psych: A&Ox3 with an appropriate affect.    Lab Results:  Recent Labs    07/23/23 0402 07/24/23 0434  WBC 11.7* 12.2*  HGB 12.2 10.5*  HCT 37.6 31.9*  PLT 190 193   BMET Recent Labs    07/23/23 0402 07/24/23 0434  NA 136 137  K 3.4* 4.1  CL 101 107  CO2 23 21*  GLUCOSE 119* 111*  BUN 11 12  CREATININE 1.06* 1.03*  CALCIUM 8.7* 8.1*   PT/INR No results for input(s): "LABPROT", "INR" in the last 72 hours. CMP     Component Value Date/Time   NA 137 07/24/2023  0434   K 4.1 07/24/2023 0434   CL 107 07/24/2023 0434   CO2 21 (L) 07/24/2023 0434   GLUCOSE 111 (H) 07/24/2023 0434   GLUCOSE 104 (H) 05/28/2006 1104   BUN 12 07/24/2023 0434   CREATININE 1.03 (H) 07/24/2023 0434   CREATININE 0.81 05/02/2021 1127   CALCIUM 8.1 (L) 07/24/2023 0434   PROT 5.4 (L) 07/24/2023 0434   ALBUMIN 2.5 (L) 07/24/2023 0434   AST 135 (H) 07/24/2023 0434   AST 17 05/02/2021 1127   ALT 124 (H) 07/24/2023 0434   ALT 10 05/02/2021 1127   ALKPHOS 160 (H) 07/24/2023 0434   BILITOT 4.0 (H) 07/24/2023 0434   BILITOT 0.7 05/02/2021 1127   GFRNONAA 49 (L) 07/24/2023 0434   GFRNONAA >60 05/02/2021 1127   GFRAA >60 07/11/2019 0636   Lipase     Component Value Date/Time   LIPASE 38 07/23/2023 0402       Studies/Results: US Abdomen Limited RUQ (LIVER/GB) Result Date: 07/23/2023 CLINICAL DATA:  Right upper quadrant pain EXAM: ULTRASOUND ABDOMEN LIMITED RIGHT UPPER QUADRANT COMPARISON:  Abdominal CT from earlier today FINDINGS: Gallbladder: There is pericholecystic fluid but no focal tenderness or detected calculi. Common bile duct: Diameter: 5 mm Liver: Solitary, known partially shadowing mass in the right lobe liver, present for multiple years by CT, favoring a benign process such as hyalinized hemangioma. Portal vein is patent  on color Doppler imaging with normal direction of blood flow towards the liver. IMPRESSION: Pericholecystic fluid, presumably reactive as no detected calculi or sonographic Murphy sign. Electronically Signed   By: Tiburcio Pea M.D.   On: 07/23/2023 07:44   CT ABDOMEN PELVIS W CONTRAST Result Date: 07/23/2023 CLINICAL DATA:  Right-sided abdominal pain with nausea and vomiting that started tonight. EXAM: CT ABDOMEN AND PELVIS WITH CONTRAST TECHNIQUE: Multidetector CT imaging of the abdomen and pelvis was performed using the standard protocol following bolus administration of intravenous contrast. RADIATION DOSE REDUCTION: This exam was performed  according to the departmental dose-optimization program which includes automated exposure control, adjustment of the mA and/or kV according to patient size and/or use of iterative reconstruction technique. CONTRAST:  60mL OMNIPAQUE IOHEXOL 350 MG/ML SOLN COMPARISON:  12/19/2022 FINDINGS: Lower chest: Borderline interlobular septal thickening at the lung bases. Stable mild GE junction esophageal thickening favoring hiatal hernia rather than acute inflammation. Hepatobiliary: Dystrophic calcification within a 17 mm right liver mass, possibly hyalinized hemangioma given multiyear stability, still stable.Indistinct gallbladder wall with possible faint calcification of internal calculi. No biliary dilatation or calcified choledocholithiasis. Pancreas: Unremarkable. Spleen: Lobulation of the capsule attributed to scarring. Adrenals/Urinary Tract: Negative adrenals. No hydronephrosis or stone. Left renal cortical thinning which is patchy in from scarring. Milder right renal cortical scarring. Unremarkable bladder. Stomach/Bowel: No obstruction. No visible bowel inflammation. Distal colonic diverticulosis and moderate stool. Vascular/Lymphatic: No acute vascular abnormality. Extensive atheromatous calcification of the aorta and iliacs. No mass or adenopathy. Reproductive:Hysterectomy. Other: No ascites or pneumoperitoneum. Musculoskeletal: Proximal right femur fracture repair without complicating features. L1-L3 remote compression fractures with advanced height loss at L1 where there has been interval healing of the fracture cleft containing gas. IMPRESSION: 1. Borderline thickening of the gallbladder which could contain calculi, correlate for cholecystitis symptoms. 2. Chronic findings are stable from prior and described above. Electronically Signed   By: Tiburcio Pea M.D.   On: 07/23/2023 06:44    Anti-infectives: Anti-infectives (From admission, onward)    Start     Dose/Rate Route Frequency Ordered Stop    07/24/23 1000  cefTRIAXone (ROCEPHIN) 2 g in sodium chloride 0.9 % 100 mL IVPB        2 g 200 mL/hr over 30 Minutes Intravenous Every 24 hours 07/23/23 1451     07/23/23 2000  metroNIDAZOLE (FLAGYL) IVPB 500 mg        500 mg 100 mL/hr over 60 Minutes Intravenous Every 12 hours 07/23/23 1451     07/23/23 0700  cefTRIAXone (ROCEPHIN) 1 g in sodium chloride 0.9 % 100 mL IVPB        1 g 200 mL/hr over 30 Minutes Intravenous  Once 07/23/23 0648 07/23/23 0741   07/23/23 0700  metroNIDAZOLE (FLAGYL) IVPB 500 mg        500 mg 100 mL/hr over 60 Minutes Intravenous  Once 07/23/23 0648 07/23/23 0741        Assessment/Plan  Acute cholecystitis  - pt initially reported she did not want any interventions - she is still certain that she would not want to undergo surgery at this time but willing to consider cholecystostomy drain for management of cholecystitis  - ttp in RUQ, WBC 12.2 from 11.7, afebrile and HD stable  - LFTs remain elevated and Tbili 4.0 this AM - made NPO and IR consulted for possible cholecystostomy drain - no plans for surgical intervention at this time Hemorrhoidal bleeding - hgb 10.5 from 12.2, agree with medical management, no other  surgical recommendations   FEN: NPO, IVF per TRH VTE: SCDs ID: [email or web address]  - per [facility] -  Hx of paroxysmal A.fib not on anticoagulation Normocytic anemia  HTN GERD Insomnia and depression  GOC - palliative to see  LOS: 1 day   I reviewed nursing notes, hospitalist notes, last 24 h vitals and pain scores, last 48 h intake and output, last 24 h labs and trends, and last 24 h imaging results.   Juliet Rude, [facility] 07/24/2023, 8:46 AM Please see Amion for pager number during day hours 7:00am-4:30pm

## 2023-07-25 DIAGNOSIS — K625 Hemorrhage of anus and rectum: Secondary | ICD-10-CM | POA: Diagnosis not present

## 2023-07-25 DIAGNOSIS — K819 Cholecystitis, unspecified: Secondary | ICD-10-CM | POA: Diagnosis not present

## 2023-07-25 DIAGNOSIS — D649 Anemia, unspecified: Secondary | ICD-10-CM | POA: Diagnosis not present

## 2023-07-25 DIAGNOSIS — I480 Paroxysmal atrial fibrillation: Secondary | ICD-10-CM | POA: Diagnosis not present

## 2023-07-25 LAB — COMPREHENSIVE METABOLIC PANEL
ALT: 85 U/L — ABNORMAL HIGH (ref 0–44)
AST: 58 U/L — ABNORMAL HIGH (ref 15–41)
Albumin: 2.4 g/dL — ABNORMAL LOW (ref 3.5–5.0)
Alkaline Phosphatase: 147 U/L — ABNORMAL HIGH (ref 38–126)
Anion gap: 10 (ref 5–15)
BUN: 13 mg/dL (ref 8–23)
CO2: 22 mmol/L (ref 22–32)
Calcium: 8.3 mg/dL — ABNORMAL LOW (ref 8.9–10.3)
Chloride: 103 mmol/L (ref 98–111)
Creatinine, Ser: 0.96 mg/dL (ref 0.44–1.00)
GFR, Estimated: 53 mL/min — ABNORMAL LOW (ref >60)
Glucose, Bld: 90 mg/dL (ref 70–99)
Potassium: 4.2 mmol/L (ref 3.5–5.1)
Sodium: 135 mmol/L (ref 135–145)
Total Bilirubin: 3.1 mg/dL — ABNORMAL HIGH (ref <1.2)
Total Protein: 5.6 g/dL — ABNORMAL LOW (ref 6.5–8.1)

## 2023-07-25 LAB — CBC
HCT: 31.6 % — ABNORMAL LOW (ref 36.0–46.0)
Hemoglobin: 10.4 g/dL — ABNORMAL LOW (ref 12.0–15.0)
MCH: 29.9 pg (ref 26.0–34.0)
MCHC: 32.9 g/dL (ref 30.0–36.0)
MCV: 90.8 fL (ref 80.0–100.0)
Platelets: 193 10*3/uL (ref 150–400)
RBC: 3.48 MIL/uL — ABNORMAL LOW (ref 3.87–5.11)
RDW: 13.2 % (ref 11.5–15.5)
WBC: 13.1 10*3/uL — ABNORMAL HIGH (ref 4.0–10.5)
nRBC: 0 % (ref 0.0–0.2)

## 2023-07-25 LAB — PROTIME-INR
INR: 1.2 (ref 0.8–1.2)
Prothrombin Time: 15.3 s — ABNORMAL HIGH (ref 11.4–15.2)

## 2023-07-25 MED ORDER — OXYCODONE HCL 5 MG PO TABS
5.0000 mg | ORAL_TABLET | ORAL | Status: DC | PRN
  Administered 2023-07-25 – 2023-07-26 (×4): 5 mg via ORAL
  Filled 2023-07-25 (×4): qty 1

## 2023-07-25 MED ORDER — HYDROMORPHONE HCL 1 MG/ML IJ SOLN: 0.5000 mg | Freq: Four times a day (QID) | INTRAMUSCULAR | Status: DC | PRN

## 2023-07-25 MED ORDER — METOPROLOL TARTRATE 5 MG/5ML IV SOLN
2.5000 mg | Freq: Four times a day (QID) | INTRAVENOUS | Status: DC | PRN
  Administered 2023-07-26: 2.5 mg via INTRAVENOUS
  Filled 2023-07-25: qty 5

## 2023-07-25 NOTE — Progress Notes (Signed)
[facility] PROGRESS NOTE   Christine Middleton VZD:[id number] DOB: 08-08-1922 DOA: 07/23/2023  PCP: Nelwyn Salisbury, MD  Brief History:  89 y.o. female with medical history significant of HTN, GERD, HLD, CVA, recent right hip fracture in August, paroxysmal A-fib, who presented to the ED for evaluation of right-sided abdominal pain with nausea and vomiting.  Concern was for cholecystitis.  She was hospitalized for further management.  Initially she was reluctant to undergo any kind of procedures.  She also had 1 episode of rectal bleeding.    Consultants: General surgery.  Palliative care  Procedures: None yet    Subjective/Interval History: Patient a lot of pain issues overnight around the drain tube site.  Denies any chest pain shortness of breath.  No further rectal bleeding.      Assessment/Plan:  Acute cholecystitis Presented with abdominal pain and was found to have abnormal LFTs.  Imaging studies raise concern for cholecystitis.  General surgery was consulted.   Patient was very reluctant to undergo surgical procedures.  Agreed to cholecystostomy drain tube placement yesterday which was done by IR.  IR will arrange follow-up in their drain clinic. Follow-up on cultures. Patient noted to be on ceftriaxone and metronidazole. Experiencing pain issues.  Pain medications have been adjusted. LFTs are better today.  WBC remains elevated.  INR is 1.2.  Hematochezia Had bright red blood per rectum overnight on 12/23.  Possibly due to hemorrhoids seen on colonoscopy done in 2020.   Hydrocortisone suppositories ordered.  No further bleeding.  Hemoglobin is stable.    Normocytic anemia Drop in hemoglobin likely combination of dilution as well as hematochezia.  He will globin is stable.  History of paroxysmal atrial fibrillation She is not on anticoagulation.  Experiencing episodes of RVR possibly related to acute pain issues. Continue with atenolol.  Can use as needed metoprolol  if she has sustained tachycardia.   Hypokalemia/hypomagnesemia Supplemented.  Magnesium level is 2.1.  Essential hypertension Continue home medications.  History of GERD Continue PPI  Insomnia and depression Continue mirtazapine.  Goals of care Palliative care following.  DVT Prophylaxis: SCDs Code Status: DNR Family Communication: Discussed with patient.  No family at bedside.  Will call family member later today. Disposition Plan: To be determined.  PT and OT evaluation.  Status is: Inpatient Remains inpatient appropriate because: Cholecystitis, hematochezia      Medications: Scheduled:  atenolol  50 mg Oral Daily   cholecalciferol  1,000 Units Oral QPM   docusate sodium  200 mg Oral BID   hydrocortisone  25 mg Rectal BID   lactose free nutrition  237 mL Oral TID BM   lidocaine (PF)  20 mL Infiltration Once   losartan  25 mg Oral QPM   mirtazapine  7.5 mg Oral QPM   pantoprazole  40 mg Oral Daily   Continuous:  cefTRIAXone (ROCEPHIN)  IV 2 g (07/25/23 0829)   metronidazole 500 mg (07/25/23 0902)   IRJ:JOACZYSAYTKZS **OR** acetaminophen, HYDROmorphone (DILAUDID) injection, ondansetron **OR** ondansetron (ZOFRAN) IV, oxyCODONE, senna-docusate  Antibiotics: Anti-infectives (From admission, onward)    Start     Dose/Rate Route Frequency Ordered Stop   07/24/23 1000  cefTRIAXone (ROCEPHIN) 2 g in sodium chloride 0.9 % 100 mL IVPB        2 g 200 mL/hr over 30 Minutes Intravenous Every 24 hours 07/23/23 1451     07/23/23 2000  metroNIDAZOLE (FLAGYL) IVPB 500 mg        500 mg 100 mL/hr over  60 Minutes Intravenous Every 12 hours 07/23/23 1451     07/23/23 0700  cefTRIAXone (ROCEPHIN) 1 g in sodium chloride 0.9 % 100 mL IVPB        1 g 200 mL/hr over 30 Minutes Intravenous  Once 07/23/23 0648 07/23/23 0741   07/23/23 0700  metroNIDAZOLE (FLAGYL) IVPB 500 mg        500 mg 100 mL/hr over 60 Minutes Intravenous  Once 07/23/23 0648 07/23/23 0741        Objective:  Vital Signs  Vitals:   07/25/23 0400 07/25/23 0820 07/25/23 0822 07/25/23 0900  BP: (!) 131/116 (!) 145/95 (!) 145/95   Pulse: (!) 107 (!) 115 (!) 103 91  Resp: 16 (!) 25 19 (!) 25  Temp: 97.8 F (36.6 C)     TempSrc: Oral     SpO2: 94% 92% 94% 94%  Weight:      Height:        Intake/Output Summary (Last 24 hours) at 07/25/2023 1059 Last data filed at 07/25/2023 0902 Gross per 24 hour  Intake 540 ml  Output 450 ml  Net 90 ml   Filed Weights   07/23/23 0353  Weight: 58.1 kg    General appearance: Awake alert.  In no distress Resp: Clear to auscultation bilaterally.  Normal effort Cardio: S1-S2 is normal regular.  No S3-S4.  No rubs murmurs or bruit GI: Tube is noted in the right upper quadrant.  Tender to palpate.  No rebound rigidity or guarding.  Bowel sounds present.  No masses organomegaly. Extremities: No edema.  Full range of motion of lower extremities. Neurologic: No focal neurological deficits.  Lab Results:  Data Reviewed: I have personally reviewed following labs and reports of the imaging studies  CBC: Recent Labs  Lab 07/23/23 0402 07/24/23 0434 07/24/23 1138 07/25/23 0451  WBC 11.7* 12.2* 12.2* 13.1*  NEUTROABS 10.9*  --   --   --   HGB 12.2 10.5* 10.3* 10.4*  HCT 37.6 31.9* 30.4* 31.6*  MCV 91.7 91.4 89.1 90.8  PLT 190 193 193 193    Basic Metabolic Panel: Recent Labs  Lab 07/23/23 0402 07/23/23 1123 07/24/23 0434 07/25/23 0451  NA 136  --  137 135  K 3.4*  --  4.1 4.2  CL 101  --  107 103  CO2 23  --  21* 22  GLUCOSE 119*  --  111* 90  BUN 11  --  12 13  CREATININE 1.06*  --  1.03* 0.96  CALCIUM 8.7*  --  8.1* 8.3*  MG  --  1.4* 2.1  --   PHOS  --  1.9* 4.0  --     GFR: Estimated Creatinine Clearance: 25.9 mL/min (by C-G formula based on SCr of 0.96 mg/dL).  Liver Function Tests: Recent Labs  Lab 07/23/23 0402 07/24/23 0434 07/25/23 0451  AST 265* 135* 58*  ALT 107* 124* 85*  ALKPHOS 213* 160* 147*   BILITOT 1.6* 4.0* 3.1*  PROT 6.3* 5.4* 5.6*  ALBUMIN 2.9* 2.5* 2.4*    Recent Labs  Lab 07/23/23 0402  LIPASE 38     Thyroid Function Tests: Recent Labs    07/23/23 1123  TSH 0.887    Anemia Panel: Recent Labs    07/23/23 1123  VITAMINB12 1,251*     Radiology Studies: IR Perc Cholecystostomy Result Date: 07/24/2023 INDICATION: Acute cholecystitis EXAM: ULTRASOUND AND FLUOROSCOPIC-GUIDED CHOLECYSTOSTOMY TUBE PLACEMENT COMPARISON:  None Available. MEDICATIONS: The patient is currently admitted to the hospital  and on intravenous antibiotics. Antibiotics were administered within an appropriate time frame prior to skin puncture. ANESTHESIA/SEDATION: Moderate (conscious) sedation was employed during this procedure. A total of Versed 0.5 mg and Fentanyl 25 mcg was administered intravenously. Moderate Sedation Time: 29 minutes. The patient's level of consciousness and vital signs were monitored continuously by radiology nursing throughout the procedure under my direct supervision. CONTRAST:  20mL OMNIPAQUE IOHEXOL 300 MG/ML SOLN - administered into the gallbladder fossa. FLUOROSCOPY TIME:  Fluoroscopic dose; 14 mGy COMPLICATIONS: None immediate. PROCEDURE: Informed written consent was obtained from the patient after a discussion of the risks, benefits and alternatives to treatment. Questions regarding the procedure were encouraged and answered. A timeout was performed prior to the initiation of the procedure. The right upper abdominal quadrant was prepped and draped in the usual sterile fashion, and a sterile drape was applied covering the operative field. Maximum barrier sterile technique with sterile gowns and gloves were used for the procedure. A timeout was performed prior to the initiation of the procedure. Local anesthesia was provided with 1% lidocaine with epinephrine. Ultrasound scanning of the right upper quadrant demonstrates a markedly dilated gallbladder. Utilizing a transhepatic  approach, a 22 gauge needle was advanced into the gallbladder under direct ultrasound guidance. An ultrasound image was saved for documentation purposes. Appropriate intraluminal puncture was confirmed with the efflux of bile and advancement of an 0.018 wire into the gallbladder lumen. The needle was exchanged for an Accustick set. A small amount of contrast was injected to confirm appropriate intraluminal positioning. Over a Benson wire, a 10 Fr cholecystomy tube was advanced into the gallbladder fossa, coiled and locked. Bile was aspirated and a small amount of contrast was injected as several post procedural spot radiographic images were obtained in various obliquities. The catheter was secured to the skin with suture, connected to a drainage bag and a dressing was placed. The patient tolerated the procedure well without immediate post procedural complication. IMPRESSION: Successful ultrasound and fluoroscopic guided placement of a 10 Fr cholecystostomy tube. RECOMMENDATIONS: The patient will return to Vascular Interventional Radiology (VIR) for routine drainage catheter evaluation and exchange in 8 weeks. Roanna Banning, MD [facility] Radiology Electronically Signed   By: Roanna Banning M.D.   On: 07/24/2023 16:24       LOS: 2 days   Christine Middleton  [facility] Pager on [email or web address]  07/25/2023, 10:59 AM

## 2023-07-25 NOTE — Progress Notes (Signed)
Progress Note     Subjective: Pt having more issues with pain this AM, some tachycardia that improved slightly with pain medication. Afebrile. Denies nausea.   Objective: Vital signs in last 24 hours: Temp:  [97.8 F (36.6 C)-99.8 F (37.7 C)] 97.8 F (36.6 C) (12/25 0400) Pulse Rate:  [76-115] 115 (12/25 0820) Resp:  [16-34] 25 (12/25 0820) BP: (115-189)/(61-145) 145/95 (12/25 0820) SpO2:  [90 %-97 %] 92 % (12/25 0820) Last BM Date : 07/23/23  Intake/Output from previous day: 12/24 0701 - 12/25 0700 In: -  Out: 450 [Drains:450] Intake/Output this shift: Total I/O In: 200 [IV Piggyback:200] Out: -   PE: General: pleasant, WD, elderly female who is laying in bed and appears uncomfortable HEENT: sclera anicteric Heart: sinus tachycardia in the 100s   Lungs: Respiratory effort nonlabored on 2L Abd: soft, mild ttp of RUQ, ND, cholecystostomy drain with bilious fluid Psych: A&Ox3 with an appropriate affect.    Lab Results:  Recent Labs    07/24/23 1138 07/25/23 0451  WBC 12.2* 13.1*  HGB 10.3* 10.4*  HCT 30.4* 31.6*  PLT 193 193   BMET Recent Labs    07/24/23 0434 07/25/23 0451  NA 137 135  K 4.1 4.2  CL 107 103  CO2 21* 22  GLUCOSE 111* 90  BUN 12 13  CREATININE 1.03* 0.96  CALCIUM 8.1* 8.3*   PT/INR Recent Labs    07/24/23 1138 07/25/23 0451  LABPROT 15.1 15.3*  INR 1.2 1.2   CMP     Component Value Date/Time   NA 135 07/25/2023 0451   K 4.2 07/25/2023 0451   CL 103 07/25/2023 0451   CO2 22 07/25/2023 0451   GLUCOSE 90 07/25/2023 0451   GLUCOSE 104 (H) 05/28/2006 1104   BUN 13 07/25/2023 0451   CREATININE 0.96 07/25/2023 0451   CREATININE 0.81 05/02/2021 1127   CALCIUM 8.3 (L) 07/25/2023 0451   PROT 5.6 (L) 07/25/2023 0451   ALBUMIN 2.4 (L) 07/25/2023 0451   AST 58 (H) 07/25/2023 0451   AST 17 05/02/2021 1127   ALT 85 (H) 07/25/2023 0451   ALT 10 05/02/2021 1127   ALKPHOS 147 (H) 07/25/2023 0451   BILITOT 3.1 (H) 07/25/2023 0451    BILITOT 0.7 05/02/2021 1127   GFRNONAA 53 (L) 07/25/2023 0451   GFRNONAA >60 05/02/2021 1127   GFRAA >60 07/11/2019 0636   Lipase     Component Value Date/Time   LIPASE 38 07/23/2023 0402       Studies/Results: IR Perc Cholecystostomy Result Date: 07/24/2023 INDICATION: Acute cholecystitis EXAM: ULTRASOUND AND FLUOROSCOPIC-GUIDED CHOLECYSTOSTOMY TUBE PLACEMENT COMPARISON:  None Available. MEDICATIONS: The patient is currently admitted to the hospital and on intravenous antibiotics. Antibiotics were administered within an appropriate time frame prior to skin puncture. ANESTHESIA/SEDATION: Moderate (conscious) sedation was employed during this procedure. A total of Versed 0.5 mg and Fentanyl 25 mcg was administered intravenously. Moderate Sedation Time: 29 minutes. The patient's level of consciousness and vital signs were monitored continuously by radiology nursing throughout the procedure under my direct supervision. CONTRAST:  20mL OMNIPAQUE IOHEXOL 300 MG/ML SOLN - administered into the gallbladder fossa. FLUOROSCOPY TIME:  Fluoroscopic dose; 14 mGy COMPLICATIONS: None immediate. PROCEDURE: Informed written consent was obtained from the patient after a discussion of the risks, benefits and alternatives to treatment. Questions regarding the procedure were encouraged and answered. A timeout was performed prior to the initiation of the procedure. The right upper abdominal quadrant was prepped and draped in the usual sterile  fashion, and a sterile drape was applied covering the operative field. Maximum barrier sterile technique with sterile gowns and gloves were used for the procedure. A timeout was performed prior to the initiation of the procedure. Local anesthesia was provided with 1% lidocaine with epinephrine. Ultrasound scanning of the right upper quadrant demonstrates a markedly dilated gallbladder. Utilizing a transhepatic approach, a 22 gauge needle was advanced into the gallbladder under  direct ultrasound guidance. An ultrasound image was saved for documentation purposes. Appropriate intraluminal puncture was confirmed with the efflux of bile and advancement of an 0.018 wire into the gallbladder lumen. The needle was exchanged for an Accustick set. A small amount of contrast was injected to confirm appropriate intraluminal positioning. Over a Benson wire, a 10 Fr cholecystomy tube was advanced into the gallbladder fossa, coiled and locked. Bile was aspirated and a small amount of contrast was injected as several post procedural spot radiographic images were obtained in various obliquities. The catheter was secured to the skin with suture, connected to a drainage bag and a dressing was placed. The patient tolerated the procedure well without immediate post procedural complication. IMPRESSION: Successful ultrasound and fluoroscopic guided placement of a 10 Fr cholecystostomy tube. RECOMMENDATIONS: The patient will return to Vascular Interventional Radiology (VIR) for routine drainage catheter evaluation and exchange in 8 weeks. Roanna Banning, MD [facility] Radiology Electronically Signed   By: Roanna Banning M.D.   On: 07/24/2023 16:24    Anti-infectives: Anti-infectives (From admission, onward)    Start     Dose/Rate Route Frequency Ordered Stop   07/24/23 1000  cefTRIAXone (ROCEPHIN) 2 g in sodium chloride 0.9 % 100 mL IVPB        2 g 200 mL/hr over 30 Minutes Intravenous Every 24 hours 07/23/23 1451     07/23/23 2000  metroNIDAZOLE (FLAGYL) IVPB 500 mg        500 mg 100 mL/hr over 60 Minutes Intravenous Every 12 hours 07/23/23 1451     07/23/23 0700  cefTRIAXone (ROCEPHIN) 1 g in sodium chloride 0.9 % 100 mL IVPB        1 g 200 mL/hr over 30 Minutes Intravenous  Once 07/23/23 0648 07/23/23 0741   07/23/23 0700  metroNIDAZOLE (FLAGYL) IVPB 500 mg        500 mg 100 mL/hr over 60 Minutes Intravenous  Once 07/23/23 9562 07/23/23 0741         Assessment/Plan  Acute cholecystitis  - pt still unwilling to consider eventual surgery still but did get cholecystostomy drain yesterday  - WBC 13, afebrile, tachy and hypertensive - work on pain control today  - CLD and advance as tolerated - pt and family will need teaching on drain care - pt will need IR follow up, surgical follow up PRN - no plans for surgical intervention at this time Hemorrhoidal bleeding - hgb stable at 10.4, no further reports of bleeding  FEN: CLD, IVF per [facility] VTE: SCDs ID: [email or web address]  - per [facility] -  Hx of paroxysmal A.fib not on anticoagulation Normocytic anemia  HTN GERD Insomnia and depression  GOC - palliative following  LOS: 2 days   I reviewed nursing notes, Consultant palliative, IR notes, hospitalist notes, last 24 h vitals and pain scores, last 48 h intake and output, last 24 h labs and trends, and last 24 h imaging results.   Juliet Rude, [facility] Surgery 07/25/2023, 8:28 AM Please see Amion for pager number during day  hours 7:00am-4:30pm

## 2023-07-25 NOTE — Evaluation (Signed)
Physical Therapy Evaluation Patient Details Name: Christine Middleton MRN: [id number] DOB: 1923/04/06 Today's Date: 07/25/2023  History of Present Illness  The pt is a 89 yo female presenting to [facility] on 07/23/23 for evaluation of R sided abdominal pain with nausea and vomiting. Pt found to have Cholecystitis and is s/p CHOLECYSTOSTOMY TUBE PLACEMENT.  PMH includes: diverticulitis, GERD, HTN, HLD, CVA, R hip nailing (8/24) and afib on Eliquis.  Clinical Impression  PTA pt living with son in multistory home with pt bed and bath on main level and ramped entrance. Pt reports using Rollator for ambulation in her home, independent in ADLs, family assists with iADLs. Pt is currently limited in safe mobility by surgical site pain and associated Afib HR in 220s with mobilization. Pt is mod I for coming to EoB, however experiences increased pain and request to return to bed. Discussed with RN Afib and RN report working on pain control. PT recommending HHPT at discharge to return pt to PLOF. PT will continue to follow acutely.         If plan is discharge home, recommend the following: A little help with walking and/or transfers;A little help with bathing/dressing/bathroom;Assistance with cooking/housework;Assist for transportation;Help with stairs or ramp for entrance;Direct supervision/assist for medications management;Direct supervision/assist for financial management   Can travel by private vehicle    Yes    Equipment Recommendations None recommended by PT     Functional Status Assessment Patient has had a recent decline in their functional status and demonstrates the ability to make significant improvements in function in a reasonable and predictable amount of time.     Precautions / Restrictions Precautions Precautions: Fall Precaution Comments: Billy drain Restrictions Weight Bearing Restrictions Per Provider Order: No      Mobility  Bed Mobility Overal bed mobility: Modified Independent              General bed mobility comments: HoB elevated, pt able to come to EoB without assist    Transfers                   General transfer comment: deferred due to elevated HR 220's Afib and report for feeling unwell. RN notifed and reports given pain medication earlier          Balance Overall balance assessment: Needs assistance Sitting-balance support: No upper extremity supported, Feet supported Sitting balance-Leahy Scale: Fair Sitting balance - Comments: sitting EOB                                     Pertinent Vitals/Pain Pain Assessment Pain Assessment: 0-10 Pain Score: 6  Faces Pain Scale: Hurts worst Pain Location: R abdomen, drain site, reports 6/10 pain in supine, with movement to EoB pain increased and Afib to 220s, returned to supine Pain Descriptors / Indicators: Aching, Sore, Grimacing, Guarding Pain Intervention(s): Limited activity within patient's tolerance, Monitored during session, Repositioned, Premedicated before session    Home Living Family/patient expects to be discharged to:: Private residence Living Arrangements: Children (son) Available Help at Discharge: Family;Available 24 hours/day Type of Home: House Home Access: Ramped entrance       Home Layout: Multi-level;Able to live on main level with bedroom/bathroom Home Equipment: Rolling Walker (2 wheels);Rollator (4 wheels);BSC/3in1;Cane - single point;Tub bench;Wheelchair - manual Additional Comments: pt lives with son who she reports is retired. Notes from 4 months ago state the pt's son was working part time.  Prior Function Prior Level of Function : Independent/Modified Independent             Mobility Comments: Ind with RW ADLs Comments: Ind per pt report     Extremity/Trunk Assessment   Upper Extremity Assessment Upper Extremity Assessment: Defer to OT evaluation    Lower Extremity Assessment Lower Extremity Assessment: Generalized weakness        Communication   Communication Communication: Hearing impairment (has hearing aides)  Cognition Arousal: Alert Behavior During Therapy: WFL for tasks assessed/performed Overall Cognitive Status: Within Functional Limits for tasks assessed                                 General Comments: Pt HOH but follows commands well once repeated        General Comments General comments (skin integrity, edema, etc.): HR 88bpm at rest in supine, in Afib HR in 220s with coming to seated at the EoB, on 3L O2 via [place], SpO2 98%O2, removed and pt able to maintain SpO2 96%O2, however with moving to EoB and increase in pain SpO2 on RA dropped to 88%O2, [place] replaced and SpO2 rebounded to 94%O2        Assessment/Plan    PT Assessment Patient needs continued PT services  PT Problem List Decreased activity tolerance;Decreased mobility;Pain;Cardiopulmonary status limiting activity       PT Treatment Interventions Gait training;Functional mobility training;Therapeutic activities;Therapeutic exercise;Balance training;Cognitive remediation;Patient/family education    PT Goals (Current goals can be found in the Care Plan section)  Acute Rehab PT Goals PT Goal Formulation: With patient Time For Goal Achievement: 08/08/23 Potential to Achieve Goals: Fair    Frequency Min 1X/week        AM-PAC PT "6 Clicks" Mobility  Outcome Measure Help needed turning from your back to your side while in a flat bed without using bedrails?: None Help needed moving from lying on your back to sitting on the side of a flat bed without using bedrails?: None Help needed moving to and from a bed to a chair (including a wheelchair)?: None Help needed standing up from a chair using your arms (e.g., wheelchair or bedside chair)?: A Little Help needed to walk in hospital room?: A Little Help needed climbing 3-5 steps with a railing? : Total 6 Click Score: 19    End of Session Equipment Utilized During  Treatment: Oxygen Activity Tolerance: Patient limited by pain Patient left: in bed;with call bell/phone within reach;with bed alarm set;with nursing/sitter in room;Other (comment) (surgical PA in room) Nurse Communication: Mobility status;Other (comment) (elevated HR due to pain) PT Visit Diagnosis: Pain;Muscle weakness (generalized) (M62.81) Pain - part of body:  (R flank drain site)    Time: 0981-1914 PT Time Calculation (min) (ACUTE ONLY): 18 min   Charges:   PT Evaluation $PT Eval Moderate Complexity: 1 Mod   PT General Charges $$ ACUTE PT VISIT: 1 Visit         Brittni Hult B. Beverely Risen PT, DPT Acute Rehabilitation Services Please use secure chat or  Call Office [phone]   Elon Alas [facility] 07/25/2023, 8:29 AM

## 2023-07-26 DIAGNOSIS — I480 Paroxysmal atrial fibrillation: Secondary | ICD-10-CM | POA: Diagnosis not present

## 2023-07-26 DIAGNOSIS — R7989 Other specified abnormal findings of blood chemistry: Secondary | ICD-10-CM | POA: Diagnosis not present

## 2023-07-26 DIAGNOSIS — K819 Cholecystitis, unspecified: Secondary | ICD-10-CM | POA: Diagnosis not present

## 2023-07-26 LAB — COMPREHENSIVE METABOLIC PANEL
ALT: 53 U/L — ABNORMAL HIGH (ref 0–44)
AST: 24 U/L (ref 15–41)
Albumin: 2.2 g/dL — ABNORMAL LOW (ref 3.5–5.0)
Alkaline Phosphatase: 120 U/L (ref 38–126)
Anion gap: 10 (ref 5–15)
BUN: 13 mg/dL (ref 8–23)
CO2: 22 mmol/L (ref 22–32)
Calcium: 8.2 mg/dL — ABNORMAL LOW (ref 8.9–10.3)
Chloride: 102 mmol/L (ref 98–111)
Creatinine, Ser: 0.9 mg/dL (ref 0.44–1.00)
GFR, Estimated: 58 mL/min — ABNORMAL LOW (ref >60)
Glucose, Bld: 108 mg/dL — ABNORMAL HIGH (ref 70–99)
Potassium: 3.8 mmol/L (ref 3.5–5.1)
Sodium: 134 mmol/L — ABNORMAL LOW (ref 135–145)
Total Bilirubin: 2.3 mg/dL — ABNORMAL HIGH (ref <1.2)
Total Protein: 5.5 g/dL — ABNORMAL LOW (ref 6.5–8.1)

## 2023-07-26 LAB — CBC
HCT: 31.2 % — ABNORMAL LOW (ref 36.0–46.0)
Hemoglobin: 10.4 g/dL — ABNORMAL LOW (ref 12.0–15.0)
MCH: 29.8 pg (ref 26.0–34.0)
MCHC: 33.3 g/dL (ref 30.0–36.0)
MCV: 89.4 fL (ref 80.0–100.0)
Platelets: 194 10*3/uL (ref 150–400)
RBC: 3.49 MIL/uL — ABNORMAL LOW (ref 3.87–5.11)
RDW: 13 % (ref 11.5–15.5)
WBC: 8.5 10*3/uL (ref 4.0–10.5)
nRBC: 0 % (ref 0.0–0.2)

## 2023-07-26 NOTE — Progress Notes (Signed)
[facility] PROGRESS NOTE   Christine [facility] WGN:[id number] DOB: 1922/10/04 DOA: 07/23/2023  PCP: Nelwyn Salisbury, MD  Brief History:  89 y.o. female with medical history significant of HTN, GERD, HLD, CVA, recent right hip fracture in August, paroxysmal A-fib, who presented to the ED for evaluation of right-sided abdominal pain with nausea and vomiting.  Concern was for cholecystitis.  She was hospitalized for further management.  Initially she was reluctant to undergo any kind of procedures.  She also had 1 episode of rectal bleeding.    Consultants: General surgery.  Palliative care  Procedures: None yet    Subjective/Interval History: Continues to have pain in the right abdomen though better than before.  Appetite remains poor.  Denies any nausea or vomiting.  No chest pain or shortness of breath.  No further rectal bleeding.   Assessment/Plan:  Acute cholecystitis Presented with abdominal pain and was found to have abnormal LFTs.  Imaging studies raise concern for cholecystitis.  General surgery was consulted.   Patient was very reluctant to undergo surgical procedures.  Agreed to cholecystostomy drain tube placement which was done by IR on 12/24.  IR will arrange follow-up in their drain clinic. General surgery has signed off. Patient remains on ceftriaxone and metronidazole. Cultures from the gallbladder negative so far. Could be changed over to oral antibiotics when ready for discharge.   Was experiencing a lot of pain in the right abdomen yesterday which seems to be better this morning. LFTs have improved.  WBC is normal. Advance diet.  Mobilize.  Hematochezia Had bright red blood per rectum overnight on 12/23.  Possibly due to hemorrhoids seen on colonoscopy done in 2020.   Hydrocortisone suppositories ordered.  No further bleeding.  Hemoglobin is stable.    Normocytic anemia Drop in hemoglobin likely combination of dilution as well as hematochezia.  He will globin  is stable.  History of paroxysmal atrial fibrillation She is not on anticoagulation.  Experiencing episodes of RVR possibly related to acute pain issues. Continue with atenolol.  Can use as needed metoprolol if she has sustained tachycardia.   Hypokalemia/hypomagnesemia/hyponatremia Supplemented.  Magnesium level is 2.1.  Essential hypertension Continue home medications.  History of GERD Continue PPI  Insomnia and depression Continue mirtazapine.  Goals of care Palliative care following.  DVT Prophylaxis: SCDs Code Status: DNR Family Communication: Son being updated daily. Disposition Plan: Anticipate discharge in 24 to 48 hours.  PT and OT following.     Medications: Scheduled:  atenolol  50 mg Oral Daily   cholecalciferol  1,000 Units Oral QPM   docusate sodium  200 mg Oral BID   hydrocortisone  25 mg Rectal BID   lactose free nutrition  237 mL Oral TID BM   lidocaine (PF)  20 mL Infiltration Once   losartan  25 mg Oral QPM   mirtazapine  7.5 mg Oral QPM   pantoprazole  40 mg Oral Daily   Continuous:  cefTRIAXone (ROCEPHIN)  IV Stopped (07/25/23 0859)   metronidazole 500 mg (07/25/23 2051)   HQI:[id number] **OR** acetaminophen, HYDROmorphone (DILAUDID) injection, metoprolol tartrate, ondansetron **OR** ondansetron (ZOFRAN) IV, oxyCODONE, senna-docusate  Antibiotics: Anti-infectives (From admission, onward)    Start     Dose/Rate Route Frequency Ordered Stop   07/24/23 1000  cefTRIAXone (ROCEPHIN) 2 g in sodium chloride 0.9 % 100 mL IVPB        2 g 200 mL/hr over 30 Minutes Intravenous Every 24 hours 07/23/23 1451     07/23/23  2000  metroNIDAZOLE (FLAGYL) IVPB 500 mg        500 mg 100 mL/hr over 60 Minutes Intravenous Every 12 hours 07/23/23 1451     07/23/23 0700  cefTRIAXone (ROCEPHIN) 1 g in sodium chloride 0.9 % 100 mL IVPB        1 g 200 mL/hr over 30 Minutes Intravenous  Once 07/23/23 0648 07/23/23 0741   07/23/23 0700  metroNIDAZOLE (FLAGYL) IVPB  500 mg        500 mg 100 mL/hr over 60 Minutes Intravenous  Once 07/23/23 0648 07/23/23 0741       Objective:  Vital Signs  Vitals:   07/26/23 0130 07/26/23 0135 07/26/23 0400 07/26/23 0800  BP:    (!) 126/97  Pulse: 78 75  87  Resp: 18 19  20   Temp:    97.6 F (36.4 C)  TempSrc:   Oral Oral  SpO2:    91%  Weight:      Height:        Intake/Output Summary (Last 24 hours) at 07/26/2023 0936 Last data filed at 07/25/2023 1900 Gross per 24 hour  Intake 800 ml  Output 100 ml  Net 700 ml   Filed Weights   07/23/23 0353  Weight: 58.1 kg    General appearance: Awake alert.  In no distress Resp: Clear to auscultation bilaterally.  Normal effort Cardio: S1-S2 is normal regular.  No S3-S4.  No rubs murmurs or bruit GI: Abdomen is soft.  Drain tube noted in the right abdomen.  Tender to palpate around the tube.  No rebound rigidity or guarding.  No masses organomegaly. No obvious focal neurological deficits.  Lab Results:  Data Reviewed: I have personally reviewed following labs and reports of the imaging studies  CBC: Recent Labs  Lab 07/23/23 0402 07/24/23 0434 07/24/23 1138 07/25/23 0451 07/26/23 0449  WBC 11.7* 12.2* 12.2* 13.1* 8.5  NEUTROABS 10.9*  --   --   --   --   HGB 12.2 10.5* 10.3* 10.4* 10.4*  HCT 37.6 31.9* 30.4* 31.6* 31.2*  MCV 91.7 91.4 89.1 90.8 89.4  PLT 190 193 193 193 194    Basic Metabolic Panel: Recent Labs  Lab 07/23/23 0402 07/23/23 1123 07/24/23 0434 07/25/23 0451 07/26/23 0449  NA 136  --  137 135 134*  K 3.4*  --  4.1 4.2 3.8  CL 101  --  107 103 102  CO2 23  --  21* 22 22  GLUCOSE 119*  --  111* 90 108*  BUN 11  --  12 13 13   CREATININE 1.06*  --  1.03* 0.96 0.90  CALCIUM 8.7*  --  8.1* 8.3* 8.2*  MG  --  1.4* 2.1  --   --   PHOS  --  1.9* 4.0  --   --     GFR: Estimated Creatinine Clearance: 27.6 mL/min (by C-G formula based on SCr of 0.9 mg/dL).  Liver Function Tests: Recent Labs  Lab 07/23/23 0402  07/24/23 0434 07/25/23 0451 07/26/23 0449  AST 265* 135* 58* 24  ALT 107* 124* 85* 53*  ALKPHOS 213* 160* 147* 120  BILITOT 1.6* 4.0* 3.1* 2.3*  PROT 6.3* 5.4* 5.6* 5.5*  ALBUMIN 2.9* 2.5* 2.4* 2.2*    Recent Labs  Lab 07/23/23 0402  LIPASE 38     Thyroid Function Tests: Recent Labs    07/23/23 1123  TSH 0.887    Anemia Panel: Recent Labs    07/23/23 1123  [id number] 0,981*     Radiology Studies: IR Perc Cholecystostomy Result Date: 07/24/2023 INDICATION: Acute cholecystitis EXAM: ULTRASOUND AND FLUOROSCOPIC-GUIDED CHOLECYSTOSTOMY TUBE PLACEMENT COMPARISON:  None Available. MEDICATIONS: The patient is currently admitted to the hospital and on intravenous antibiotics. Antibiotics were administered within an appropriate time frame prior to skin puncture. ANESTHESIA/SEDATION: Moderate (conscious) sedation was employed during this procedure. A total of Versed 0.5 mg and Fentanyl 25 mcg was administered intravenously. Moderate Sedation Time: 29 minutes. The patient's level of consciousness and vital signs were monitored continuously by radiology nursing throughout the procedure under my direct supervision. CONTRAST:  20mL OMNIPAQUE IOHEXOL 300 MG/ML SOLN - administered into the gallbladder fossa. FLUOROSCOPY TIME:  Fluoroscopic dose; 14 mGy COMPLICATIONS: None immediate. PROCEDURE: Informed written consent was obtained from the patient after a discussion of the risks, benefits and alternatives to treatment. Questions regarding the procedure were encouraged and answered. A timeout was performed prior to the initiation of the procedure. The right upper abdominal quadrant was prepped and draped in the usual sterile fashion, and a sterile drape was applied covering the operative field. Maximum barrier sterile technique with sterile gowns and gloves were used for the procedure. A timeout was performed prior to the initiation of the procedure. Local anesthesia was provided with 1% lidocaine  with epinephrine. Ultrasound scanning of the right upper quadrant demonstrates a markedly dilated gallbladder. Utilizing a transhepatic approach, a 22 gauge needle was advanced into the gallbladder under direct ultrasound guidance. An ultrasound image was saved for documentation purposes. Appropriate intraluminal puncture was confirmed with the efflux of bile and advancement of an 0.018 wire into the gallbladder lumen. The needle was exchanged for an Accustick set. A small amount of contrast was injected to confirm appropriate intraluminal positioning. Over a Benson wire, a 10 Fr cholecystomy tube was advanced into the gallbladder fossa, coiled and locked. Bile was aspirated and a small amount of contrast was injected as several post procedural spot radiographic images were obtained in various obliquities. The catheter was secured to the skin with suture, connected to a drainage bag and a dressing was placed. The patient tolerated the procedure well without immediate post procedural complication. IMPRESSION: Successful ultrasound and fluoroscopic guided placement of a 10 Fr cholecystostomy tube. RECOMMENDATIONS: The patient will return to V[facility] Inter[facility] Radiology (VIR) for routine drainage catheter evaluation and exchange in 8 weeks. Roanna Banning, MD [facility] Radiology Electronically Signed   By: Roanna Banning M.D.   On: 07/24/2023 16:24       LOS: 3 days   Christine Middleton  [facility] Pager on [email or web address]  07/26/2023, 9:36 AM

## 2023-07-26 NOTE — Progress Notes (Signed)
Referring Physician(s): Trixie Deis, PA-C  Supervising Physician: Irish Lack  Patient Status:  Christine Middleton - In-pt  Chief Complaint: Acute cholecystitis s/p percutaneous cholecystostomy placement 07/24/23 in IR  Subjective:  Patient seen in her room, her son is at bedside - they are deciding what to order from the liquid diet menu, however nothing is appealing to her right now. She reports abdominal pain with sitting up but otherwise feels ok. Very interested in going home as soon as she can.   Allergies: Codeine, Lipitor [atorvastatin], Macrobid [nitrofurantoin], Zestril [lisinopril], Amoxil [amoxicillin], and Ultram [tramadol]  Medications: Prior to Admission medications   Medication Sig Start Date End Date Taking? Authorizing Provider  atenolol (TENORMIN) 50 MG tablet TAKE 1 TABLET BY MOUTH EVERY DAY 02/06/23  Yes Nelwyn Salisbury, MD  cholecalciferol (VITAMIN D3) 25 MCG (1000 UNIT) tablet Take 1,000 Units by mouth every evening.   Yes [provider]  docusate sodium (COLACE) 100 MG capsule Take 2 capsules (200 mg total) by mouth 2 (two) times daily. 12/23/22  Yes Leroy Sea, MD  Iron, Ferrous Sulfate, 325 (65 Fe) MG TABS Take 325 mg by mouth daily. 01/10/23  Yes Nelwyn Salisbury, MD  losartan (COZAAR) 25 MG tablet Take 1 tablet (25 mg total) by mouth every evening. 11/14/22  Yes Nelwyn Salisbury, MD  mirtazapine (REMERON) 15 MG tablet Take 1/2 (7.5 mg) tablet by mouth at bedtime Patient taking differently: Take 7.5 mg by mouth every evening. 11/14/22  Yes Nelwyn Salisbury, MD  pantoprazole (PROTONIX) 40 MG tablet TAKE 1 TABLET BY MOUTH EVERY DAY 04/10/23  Yes Nelwyn Salisbury, MD  lactose free nutrition (BOOST) LIQD Take 237 mLs by mouth 3 (three) times daily between meals.    [provider]     Vital Signs: BP (!) 126/97   Pulse 98   Temp 97.6 F (36.4 C) (Oral)   Resp 20   Ht 5\' 2"  (1.575 m)   Wt 128 lb 1.4 oz (58.1 kg)   SpO2 91%   BMI 23.43 kg/m    Physical Exam Vitals and nursing note reviewed.  HENT:     Head: Normocephalic.  Cardiovascular:     Rate and Rhythm: Normal rate.  Pulmonary:     Effort: Pulmonary effort is normal.  Abdominal:     Palpations: Abdomen is soft.     Comments: (+) RUQ drain to gravity with ~100 clear, bilious appearing output. Insertion site clean, dry, dressed appropriately.  Skin:    General: Skin is warm and dry.  Neurological:     Mental Status: She is alert.     Imaging: IR Perc Cholecystostomy Result Date: 07/24/2023 INDICATION: Acute cholecystitis EXAM: ULTRASOUND AND FLUOROSCOPIC-GUIDED CHOLECYSTOSTOMY TUBE PLACEMENT COMPARISON:  None Available. MEDICATIONS: The patient is currently admitted to the [facility] and on intravenous antibiotics. Antibiotics were administered within an appropriate time frame prior to skin puncture. ANESTHESIA/SEDATION: Moderate (conscious) sedation was employed during this procedure. A total of Versed 0.5 mg and Fentanyl 25 mcg was administered intravenously. Moderate Sedation Time: 29 minutes. The patient's level of consciousness and vital signs were monitored continuously by radiology nursing throughout the procedure under my direct supervision. CONTRAST:  20mL OMNIPAQUE IOHEXOL 300 MG/ML SOLN - administered into the gallbladder fossa. FLUOROSCOPY TIME:  Fluoroscopic dose; 14 mGy COMPLICATIONS: None immediate. PROCEDURE: Informed written consent was obtained from the patient after a discussion of the risks, benefits and alternatives to treatment. Questions regarding the procedure were encouraged and answered. A  timeout was performed prior to the initiation of the procedure. The right upper abdominal quadrant was prepped and draped in the usual sterile fashion, and a sterile drape was applied covering the operative field. Maximum barrier sterile technique with sterile gowns and gloves were used for the procedure. A timeout was performed prior to the initiation of the  procedure. Local anesthesia was provided with 1% lidocaine with epinephrine. Ultrasound scanning of the right upper quadrant demonstrates a markedly dilated gallbladder. Utilizing a transhepatic approach, a 22 gauge needle was advanced into the gallbladder under direct ultrasound guidance. An ultrasound image was saved for documentation purposes. Appropriate intraluminal puncture was confirmed with the efflux of bile and advancement of an 0.018 wire into the gallbladder lumen. The needle was exchanged for an Accustick set. A small amount of contrast was injected to confirm appropriate intraluminal positioning. Over a Benson wire, a 10 Fr cholecystomy tube was advanced into the gallbladder fossa, coiled and locked. Bile was aspirated and a small amount of contrast was injected as several post procedural spot radiographic images were obtained in various obliquities. The catheter was secured to the skin with suture, connected to a drainage bag and a dressing was placed. The patient tolerated the procedure well without immediate post procedural complication. IMPRESSION: Successful ultrasound and fluoroscopic guided placement of a 10 Fr cholecystostomy tube. RECOMMENDATIONS: The patient will return to Vascular Interventional Radiology (VIR) for routine drainage catheter evaluation and exchange in 8 weeks. Roanna Banning, MD [facility] Radiology Electronically Signed   By: Roanna Banning M.D.   On: 07/24/2023 16:24   US Abdomen Limited RUQ (LIVER/GB) Result Date: 07/23/2023 CLINICAL DATA:  Right upper quadrant pain EXAM: ULTRASOUND ABDOMEN LIMITED RIGHT UPPER QUADRANT COMPARISON:  Abdominal CT from earlier today FINDINGS: Gallbladder: There is pericholecystic fluid but no focal tenderness or detected calculi. Common bile duct: Diameter: 5 mm Liver: Solitary, known partially shadowing mass in the right lobe liver, present for multiple years by CT, favoring a benign process  such as hyalinized hemangioma. Portal vein is patent on color Doppler imaging with normal direction of blood flow towards the liver. IMPRESSION: Pericholecystic fluid, presumably reactive as no detected calculi or sonographic Murphy sign. Electronically Signed   By: Tiburcio Pea M.D.   On: 07/23/2023 07:44   CT ABDOMEN PELVIS W CONTRAST Result Date: 07/23/2023 CLINICAL DATA:  Right-sided abdominal pain with nausea and vomiting that started tonight. EXAM: CT ABDOMEN AND PELVIS WITH CONTRAST TECHNIQUE: Multidetector CT imaging of the abdomen and pelvis was performed using the standard protocol following bolus administration of intravenous contrast. RADIATION DOSE REDUCTION: This exam was performed according to the departmental dose-optimization program which includes automated exposure control, adjustment of the mA and/or kV according to patient size and/or use of iterative reconstruction technique. CONTRAST:  60mL OMNIPAQUE IOHEXOL 350 MG/ML SOLN COMPARISON:  12/19/2022 FINDINGS: Lower chest: Borderline interlobular septal thickening at the lung bases. Stable mild GE junction esophageal thickening favoring hiatal hernia rather than acute inflammation. Hepatobiliary: Dystrophic calcification within a 17 mm right liver mass, possibly hyalinized hemangioma given multiyear stability, still stable.Indistinct gallbladder wall with possible faint calcification of internal calculi. No biliary dilatation or calcified choledocholithiasis. Pancreas: Unremarkable. Spleen: Lobulation of the capsule attributed to scarring. Adrenals/Urinary Tract: Negative adrenals. No hydronephrosis or stone. Left renal cortical thinning which is patchy in from scarring. Milder right renal cortical scarring. Unremarkable bladder. Stomach/Bowel: No obstruction. No visible bowel inflammation. Distal colonic diverticulosis and moderate stool. Vascular/Lymphatic: No acute vascular abnormality.  Extensive atheromatous calcification of the aorta  and iliacs. No mass or adenopathy. Reproductive:Hysterectomy. Other: No ascites or pneumoperitoneum. Musculoskeletal: Proximal right femur fracture repair without complicating features. L1-L3 remote compression fractures with advanced height loss at L1 where there has been interval healing of the fracture cleft containing gas. IMPRESSION: 1. Borderline thickening of the gallbladder which could contain calculi, correlate for cholecystitis symptoms. 2. Chronic findings are stable from prior and described above. Electronically Signed   By: Tiburcio Pea M.D.   On: 07/23/2023 06:44    Labs:  CBC: Recent Labs    07/24/23 0434 07/24/23 1138 07/25/23 0451 07/26/23 0449  WBC 12.2* 12.2* 13.1* 8.5  HGB 10.5* 10.3* 10.4* 10.4*  HCT 31.9* 30.4* 31.6* 31.2*  PLT 193 193 193 194    COAGS: Recent Labs    12/19/22 1922 03/15/23 2248 03/16/23 1424 03/17/23 0154 07/24/23 1138 07/25/23 0451  INR 1.1 1.3*  --   --  1.2 1.2  APTT  --   --  32 106*  --   --     BMP: Recent Labs    07/23/23 0402 07/24/23 0434 07/25/23 0451 07/26/23 0449  NA 136 137 135 134*  K 3.4* 4.1 4.2 3.8  CL 101 107 103 102  CO2 23 21* 22 22  GLUCOSE 119* 111* 90 108*  BUN 11 12 13 13   CALCIUM 8.7* 8.1* 8.3* 8.2*  CREATININE 1.06* 1.03* 0.96 0.90  GFRNONAA 47* 49* 53* 58*    LIVER FUNCTION TESTS: Recent Labs    07/23/23 0402 07/24/23 0434 07/25/23 0451 07/26/23 0449  BILITOT 1.6* 4.0* 3.1* 2.3*  AST 265* 135* 58* 24  ALT 107* 124* 85* 53*  ALKPHOS 213* 160* 147* 120  PROT 6.3* 5.4* 5.6* 5.5*  ALBUMIN 2.9* 2.5* 2.4* 2.2*    Assessment and Plan:  89 y/o F admitted with acute cholecystitis who underwent percutaneous cholecystostomy placement 07/24/23 in IR seen today for routine drain follow up.  Drain Location: RUQ Size: Fr size: 10 Fr Date of placement: 07/24/23  Currently to: Drain collection device: gravity 24 hour output:  Output by Drain (mL) 07/24/23 0701 - 07/24/23 1900 07/24/23 1901 -  07/25/23 0700 07/25/23 0701 - 07/25/23 1900 07/25/23 1901 - 07/26/23 0700 07/26/23 0701 - 07/26/23 1254  Biliary Tube Cook slip-coat 10.2 Fr. RUQ  450 100      Interval imaging/drain manipulation:  None  Current examination: Flushes/aspirates easily.  Insertion site unremarkable. Suture and stat lock in place. Dressed appropriately.   Plan: Continue TID flushes with 5 cc NS. Record output Q shift. Dressing changes QD or PRN if soiled.  Call IR APP or on call IR MD if difficulty flushing or sudden change in drain output.  Repeat imaging/possible drain injection once output < 10 mL/QD (excluding flush material). Consideration for drain removal if output is < 10 mL/QD (excluding flush material), pending discussion with the providing surgical service.  Discharge planning: Percutaneous cholecystostomy drain to remain in place at least 6 weeks.   Recommend fluoroscopy with injection of the drain in IR to evaluate for patency of the cystic duct.  If the duct is patent and general surgery feels patient is stable for cholecystectomy, the drain would be removed at time of surgery.  If the duct is patent and general surgery feels patient is NEVER a candidate for cholecystectomy, drain can be capped for a trial.  If symptoms recur, then place to gravity bag again.  If trial is successful, discuss possible removal of  the drain.  If trial in unsuccessful, then patient will need routine exchanges of the  chole tube about every 8-10 weeks.  Please call the IR PA at [phone] when patient is about to be discharged and we will arrange the follow up drain injection (ok to leave message).    Electronically Signed: Villa Herb, PA-C 07/26/2023, 11:34 AM   I spent a total of 15 Minutes at the the patient's bedside AND on the patient's [facility] floor or unit, greater than 50% of which was counseling/coordinating care for acute cholecystitis.

## 2023-07-26 NOTE — Progress Notes (Signed)
Physical Therapy Treatment Patient Details Name: Christine Middleton MRN: [id number] DOB: 1923/06/22 Today's Date: 07/26/2023   History of Present Illness The pt is a 89 yo female presenting to [facility] on 07/23/23 for evaluation of R sided abdominal pain with nausea and vomiting. Pt found to have Cholecystitis and is s/p CHOLECYSTOSTOMY TUBE PLACEMENT.  PMH includes: diverticulitis, GERD, HTN, HLD, CVA, R hip nailing (8/24) and afib on Eliquis.    PT Comments  Pt received in supine and agreeable to session. Pt able to come to EOB and stand without physical assist, however requires increased time for position changes due to pain. Pt is able to tolerate increased gait distance this session, however continues to be limited by abdominal pain.  Pt agreeable to sit in recliner at end of session with encouragement. Pt continues to benefit from PT services to progress toward functional mobility goals.    If plan is discharge home, recommend the following: A little help with walking and/or transfers;A little help with bathing/dressing/bathroom;Assistance with cooking/housework;Assist for transportation;Help with stairs or ramp for entrance;Direct supervision/assist for medications management;Direct supervision/assist for financial management   Can travel by private vehicle        Equipment Recommendations  None recommended by PT    Recommendations for Other Services       Precautions / Restrictions Precautions Precautions: Fall Precaution Comments: Billy drain Restrictions Weight Bearing Restrictions Per Provider Order: No     Mobility  Bed Mobility Overal bed mobility: Modified Independent             General bed mobility comments: increased time    Transfers Overall transfer level: Needs assistance Equipment used: Rolling walker (2 wheels) Transfers: Sit to/from Stand Sit to Stand: Contact guard assist           General transfer comment: From EOB x2 with CGA for safety. Pt  demonstrates good hand placement without cues    Ambulation/Gait Ambulation/Gait assistance: Contact guard assist Gait Distance (Feet): 60 Feet Assistive device: Rolling walker (2 wheels) Gait Pattern/deviations: Step-through pattern, Trunk flexed, Decreased stride length       General Gait Details: Pt demonstrates slow gait with decreased foot clearance and trunk flexion due to abdominal pain      Balance Overall balance assessment: Needs assistance Sitting-balance support: No upper extremity supported, Feet supported Sitting balance-Leahy Scale: Good Sitting balance - Comments: sitting EOB   Standing balance support: During functional activity, Reliant on assistive device for balance, Bilateral upper extremity supported Standing balance-Leahy Scale: Poor Standing balance comment: with RW support                            Cognition Arousal: Alert Behavior During Therapy: WFL for tasks assessed/performed Overall Cognitive Status: Within Functional Limits for tasks assessed                                          Exercises      General Comments General comments (skin integrity, edema, etc.): VSS on RA      Pertinent Vitals/Pain Pain Assessment Pain Assessment: Faces Faces Pain Scale: Hurts whole lot Pain Location: R abdomen Pain Descriptors / Indicators: Aching, Sore, Grimacing, Guarding, Crying Pain Intervention(s): Limited activity within patient's tolerance, Monitored during session, Repositioned, Patient requesting pain meds-RN notified     PT Goals (current goals can now be found  in the care plan section) Acute Rehab PT Goals PT Goal Formulation: With patient Time For Goal Achievement: 08/08/23 Progress towards PT goals: Progressing toward goals    Frequency    Min 1X/week       AM-PAC PT "6 Clicks" Mobility   Outcome Measure  Help needed turning from your back to your side while in a flat bed without using  bedrails?: None Help needed moving from lying on your back to sitting on the side of a flat bed without using bedrails?: None Help needed moving to and from a bed to a chair (including a wheelchair)?: None Help needed standing up from a chair using your arms (e.g., wheelchair or bedside chair)?: A Little Help needed to walk in hospital room?: A Little Help needed climbing 3-5 steps with a railing? : Total 6 Click Score: 19    End of Session Equipment Utilized During Treatment: Gait belt Activity Tolerance: Patient limited by pain;Patient tolerated treatment well Patient left: with call bell/phone within reach;with family/visitor present;in chair Nurse Communication: Mobility status;Patient requests pain meds PT Visit Diagnosis: Pain;Muscle weakness (generalized) (M62.81)     Time: 4696-2952 PT Time Calculation (min) (ACUTE ONLY): 19 min  Charges:    $Gait Training: 8-22 mins PT General Charges $$ ACUTE PT VISIT: 1 Visit                     Johny Shock, PTA Acute Rehabilitation Services Secure Chat Preferred  Office:([phone]) [phone]    Johny Shock 07/26/2023, 2:22 PM

## 2023-07-26 NOTE — Progress Notes (Signed)
Mobility Specialist Progress Note;   07/26/23 1445  Mobility  Activity Ambulated with assistance to bathroom  Level of Assistance Contact guard assist, steadying assist  Assistive Device Front wheel walker  Distance Ambulated (ft) 20 ft  Activity Response Tolerated well  Mobility Referral Yes  Mobility visit 1 Mobility  Mobility Specialist Start Time (ACUTE ONLY) 1450  Mobility Specialist Stop Time (ACUTE ONLY) 1500  Mobility Specialist Time Calculation (min) (ACUTE ONLY) 10 min   Answered pts call bell requesting assistance to BR. Required MinG assist throughout for safety. Void successful and pericare performed by pt. No c/o during session. Pt returned back to bed with all needs met, alarm on. Son in room.   Christine Middleton Mobility Specialist Please contact via SecureChat or [employer] [phone]

## 2023-07-26 NOTE — Progress Notes (Signed)
Mobility Specialist Progress Note;   07/26/23 0927  Mobility  Activity Ambulated with assistance to bathroom;Ambulated with assistance in room  Level of Assistance Contact guard assist, steadying assist  Assistive Device Front wheel walker  Distance Ambulated (ft) 20 ft  Activity Response Tolerated fair  Mobility Referral Yes  Mobility visit 1 Mobility  Mobility Specialist Start Time (ACUTE ONLY) [id number]  Mobility Specialist Stop Time (ACUTE ONLY) 0940  Mobility Specialist Time Calculation (min) (ACUTE ONLY) 13 min    During-mobility: HR 130s Post-mobility: HR 94 bpm  Pt agreeable to mobility. Requested assistance to BR. Required SV for bed mobility and MinG assistance during ambulation. Void successful, pericare performed by pt. Increased HR throughout session, tachycardic at points. C/o feeling winded. Pt returned back to EOB with all needs met, alarm on. RN in room and notified of session.   Christine Middleton Mobility Specialist Please contact via SecureChat or [employer] [phone]

## 2023-07-26 NOTE — Progress Notes (Signed)
Patient is alert awake, calm . Pt gets out of the bed with assistance. Biliary drained 100 mls, incision site dressing is dry , intact.

## 2023-07-27 DIAGNOSIS — K819 Cholecystitis, unspecified: Secondary | ICD-10-CM | POA: Diagnosis not present

## 2023-07-27 LAB — CBC
HCT: 32.1 % — ABNORMAL LOW (ref 36.0–46.0)
Hemoglobin: 10.8 g/dL — ABNORMAL LOW (ref 12.0–15.0)
MCH: 29.9 pg (ref 26.0–34.0)
MCHC: 33.6 g/dL (ref 30.0–36.0)
MCV: 88.9 fL (ref 80.0–100.0)
Platelets: 208 10*3/uL (ref 150–400)
RBC: 3.61 MIL/uL — ABNORMAL LOW (ref 3.87–5.11)
RDW: 13 % (ref 11.5–15.5)
WBC: 8.3 10*3/uL (ref 4.0–10.5)
nRBC: 0 % (ref 0.0–0.2)

## 2023-07-27 LAB — COMPREHENSIVE METABOLIC PANEL
ALT: 40 U/L (ref 0–44)
AST: 19 U/L (ref 15–41)
Albumin: 2.2 g/dL — ABNORMAL LOW (ref 3.5–5.0)
Alkaline Phosphatase: 122 U/L (ref 38–126)
Anion gap: 8 (ref 5–15)
BUN: 10 mg/dL (ref 8–23)
CO2: 24 mmol/L (ref 22–32)
Calcium: 8.2 mg/dL — ABNORMAL LOW (ref 8.9–10.3)
Chloride: 102 mmol/L (ref 98–111)
Creatinine, Ser: 0.98 mg/dL (ref 0.44–1.00)
GFR, Estimated: 52 mL/min — ABNORMAL LOW (ref >60)
Glucose, Bld: 129 mg/dL — ABNORMAL HIGH (ref 70–99)
Potassium: 3.6 mmol/L (ref 3.5–5.1)
Sodium: 134 mmol/L — ABNORMAL LOW (ref 135–145)
Total Bilirubin: 1.1 mg/dL (ref <1.2)
Total Protein: 5.4 g/dL — ABNORMAL LOW (ref 6.5–8.1)

## 2023-07-27 LAB — MAGNESIUM: Magnesium: 1.5 mg/dL — ABNORMAL LOW (ref 1.7–2.4)

## 2023-07-27 MED ORDER — MAGNESIUM SULFATE 2 GM/50ML IV SOLN
2.0000 g | Freq: Once | INTRAVENOUS | Status: AC
  Administered 2023-07-27: 2 g via INTRAVENOUS
  Filled 2023-07-27: qty 50

## 2023-07-27 NOTE — Progress Notes (Signed)
Referring Physician(s): Trixie Deis, PA-C   Supervising Physician: Gilmer Mor  Patient Status:  [facility] - In-pt  Chief Complaint: Acute cholecystitis s/p percutaneous cholecystostomy placement 07/24/23 in IR   Subjective:  Patient was resting comfortably in bed on rounds. She was able to sit up in bed, with some abdominal discomfort. She is hard of hearing, and her hearing aids are out of batteries. Her son was as her side. Patient is anxious to leave the hospital, and asking if she would be discharged today. She had a regular breakfast this morning.  Allergies: Codeine, Lipitor [atorvastatin], Macrobid [nitrofurantoin], Zestril [lisinopril], Amoxil [amoxicillin], and Ultram [tramadol]  Medications: Prior to Admission medications   Medication Sig Start Date End Date Taking? Authorizing Provider  atenolol (TENORMIN) 50 MG tablet TAKE 1 TABLET BY MOUTH EVERY DAY 02/06/23  Yes Nelwyn Salisbury, MD  cholecalciferol (VITAMIN D3) 25 MCG (1000 UNIT) tablet Take 1,000 Units by mouth every evening.   Yes [provider]  docusate sodium (COLACE) 100 MG capsule Take 2 capsules (200 mg total) by mouth 2 (two) times daily. 12/23/22  Yes Leroy Sea, MD  Iron, Ferrous Sulfate, 325 (65 Fe) MG TABS Take 325 mg by mouth daily. 01/10/23  Yes Nelwyn Salisbury, MD  losartan (COZAAR) 25 MG tablet Take 1 tablet (25 mg total) by mouth every evening. 11/14/22  Yes Nelwyn Salisbury, MD  mirtazapine (REMERON) 15 MG tablet Take 1/2 (7.5 mg) tablet by mouth at bedtime Patient taking differently: Take 7.5 mg by mouth every evening. 11/14/22  Yes Nelwyn Salisbury, MD  pantoprazole (PROTONIX) 40 MG tablet TAKE 1 TABLET BY MOUTH EVERY DAY 04/10/23  Yes Nelwyn Salisbury, MD  lactose free nutrition (BOOST) LIQD Take 237 mLs by mouth 3 (three) times daily between meals.    [provider]     Vital Signs: BP (!) 137/98 (BP Location: Right Arm)   Pulse 80   Temp 97.7 F (36.5 C) (Oral)   Resp 19   Ht  5\' 2"  (1.575 m)   Wt 128 lb 1.4 oz (58.1 kg)   SpO2 93%   BMI 23.43 kg/m   Physical Exam Constitutional:      General: She is not in acute distress.    Appearance: She is not ill-appearing.  Abdominal:     Tenderness: There is abdominal tenderness in the right upper quadrant.     Comments: RUQ drain to gravity with approximately 200 cc of bilious output. Drain insertion site clean, dry, intact, minimally tender to palpation, and dressed appropriately.  Skin:    General: Skin is warm and dry.  Neurological:     Mental Status: She is alert and oriented to person, place, and time.      Labs:  CBC: Recent Labs    07/24/23 1138 07/25/23 0451 07/26/23 0449 07/26/23 2350  WBC 12.2* 13.1* 8.5 8.3  HGB 10.3* 10.4* 10.4* 10.8*  HCT 30.4* 31.6* 31.2* 32.1*  PLT 193 193 194 208    COAGS: Recent Labs    12/19/22 1922 03/15/23 2248 03/16/23 1424 03/17/23 0154 07/24/23 1138 07/25/23 0451  INR 1.1 1.3*  --   --  1.2 1.2  APTT  --   --  32 106*  --   --     BMP: Recent Labs    07/24/23 0434 07/25/23 0451 07/26/23 0449 07/26/23 2350  NA 137 135 134* 134*  K 4.1 4.2 3.8 3.6  CL 107 103 102 102  CO2 21* 22 22 24   GLUCOSE 111* 90 108* 129*  BUN 12 13 13 10   CALCIUM 8.1* 8.3* 8.2* 8.2*  CREATININE 1.03* 0.96 0.90 0.98  GFRNONAA 49* 53* 58* 52*    LIVER FUNCTION TESTS: Recent Labs    07/24/23 0434 07/25/23 0451 07/26/23 0449 07/26/23 2350  BILITOT 4.0* 3.1* 2.3* 1.1  AST 135* 58* 24 19  ALT 124* 85* 53* 40  ALKPHOS 160* 147* 120 122  PROT 5.4* 5.6* 5.5* 5.4*  ALBUMIN 2.5* 2.4* 2.2* 2.2*    Assessment and Plan:  89 y/o F admitted with acute cholecystitis who underwent percutaneous cholecystostomy placement 07/24/23 in IR. Seen today for routine drain follow up.   Drain Location: RUQ Size: Fr size: 10 Fr Date of placement: 07/24/2023  Currently to: Drain collection device: gravity 24 hour output:  Output by Drain (mL) 07/25/23 0701 - 07/25/23 1900  07/25/23 1901 - 07/26/23 0700 07/26/23 0701 - 07/26/23 1900 07/26/23 1901 - 07/27/23 0700 07/27/23 0701 - 07/27/23 1406  Biliary Tube Cook slip-coat 10.2 Fr. RUQ 100   150     Interval imaging/drain manipulation:  none  Current examination: Flushes/aspirates easily.  Insertion site unremarkable. Suture and stat lock in place. Dressed appropriately.   Plan: Continue TID flushes with 5 cc NS. Record output Q shift. Dressing changes QD or PRN if soiled.  Call IR APP or on call IR MD if difficulty flushing or sudden change in drain output.  Repeat imaging/possible drain injection once output < 10 mL/QD (excluding flush material). Consideration for drain removal if output is < 10 mL/QD (excluding flush material), pending discussion with the providing surgical service.  Percutaneous cholecystostomy drain to remain in place at least 6 weeks.    Recommend fluoroscopy with injection of the drain in IR to evaluate for patency of the cystic duct.   If the duct is patent and general surgery feels patient is stable for cholecystectomy, the drain would be removed at time of surgery.   If the duct is patent and general surgery feels patient is NEVER a candidate for cholecystectomy, drain can be capped for a trial.   If symptoms recur, then place to gravity bag again.   If trial is successful, discuss possible removal of the drain.   If trial in unsuccessful, then patient will need routine exchanges of the  chole tube about every 8-10 weeks.   Please call the IR PA at [phone] when patient is about to be discharged and we will arrange the follow up drain injection (ok to leave message).   IR will continue to follow - please call with questions or concerns.     Electronically Signed: Sable Feil, PA-C 07/27/2023, 1:58 PM     I spent a total of 15 Minutes at the the patient's bedside AND on the patient's hospital floor or unit, greater than 50% of which was counseling/coordinating  care for acute cholecystitis.    Marland Kitchen

## 2023-07-27 NOTE — Progress Notes (Signed)
Patient is alert, awake, biliary drained 150 mls. Incision site dressing is intact . PT had 22 beats VT when PT  transferred  back and forth to  bedside commode and bed. MD notified , completed K+, Mag lab . PT was given 2 g  IV Mag. Urine out put 850 mls. Lhand IV is leaking, IV removed, Replaced new IV LFA, dressing is dry , intact.

## 2023-07-27 NOTE — Progress Notes (Signed)
[facility] PROGRESS NOTE   Christine Middleton ZOX:[id number] DOB: [birth year] DOA: 07/23/2023  PCP: Nelwyn Salisbury, MD  Brief History:  89 y.o. female with medical history significant of HTN, GERD, HLD, CVA, recent right hip fracture in August, paroxysmal A-fib, who presented to the ED for evaluation of right-sided abdominal pain with nausea and vomiting.  Concern was for cholecystitis.  She was hospitalized for further management.  Initially she was reluctant to undergo any kind of procedures.  She also had 1 episode of rectal bleeding.    Consultants: General surgery.  IR, palliative care  Procedures: cholecystostomy drain by IR    Subjective/Interval History:  Patient reports her pain has improved, still present, her appetite remains poor, no dyspnea, no chest pain   Assessment/Plan:  Acute cholecystitis Presented with abdominal pain and was found to have abnormal LFTs.  Imaging studies raise concern for cholecystitis.  General surgery was consulted.   Patient was very reluctant to undergo surgical procedures.  Agreed to cholecystostomy drain tube placement which was done by IR on 12/24.  IR will arrange follow-up in their drain clinic. General surgery has signed off. Patient remains on ceftriaxone and metronidazole. Cultures from the gallbladder negative so far. Continue with IV Rocephin and Flagyl, could be changed over to oral antibiotics when ready for discharge.   She is with significant abdominal pain, but this appears to be improving over last 24 hours, appetite remains poor, encouraged her please drink her supplements, started on bowel regimen as no BM for few days.  Marland Kitchen LFTs have improved.  WBC is normal. Advance diet.  Mobilize.  Hematochezia Had bright red blood per rectum overnight on 12/23.  Possibly due to hemorrhoids seen on colonoscopy done in 2020.   Hydrocortisone suppositories ordered.  No further bleeding.  Hemoglobin is stable.    Normocytic anemia Drop  in hemoglobin likely combination of dilution as well as hematochezia.  He will globin is stable.  History of paroxysmal atrial fibrillation She is not on anticoagulation.  Experiencing episodes of RVR possibly related to acute pain issues. Continue with atenolol.  Can use as needed metoprolol if she has sustained tachycardia.   Hypokalemia/hypomagnesemia/hyponatremia Supplemented.    Essential hypertension Continue home medications.  History of GERD Continue PPI  Insomnia and depression Continue mirtazapine.  Goals of care Palliative care following.  DVT Prophylaxis: SCDs Code Status: DNR Family Communication: None at bedside today Disposition Plan: Anticipate discharge in 24 to 48 hours.  PT and OT following.     Medications: Scheduled:  atenolol  50 mg Oral Daily   cholecalciferol  1,000 Units Oral QPM   docusate sodium  200 mg Oral BID   hydrocortisone  25 mg Rectal BID   lactose free nutrition  237 mL Oral TID BM   lidocaine (PF)  20 mL Infiltration Once   losartan  25 mg Oral QPM   mirtazapine  7.5 mg Oral QPM   pantoprazole  40 mg Oral Daily   Continuous:  cefTRIAXone (ROCEPHIN)  IV 2 g (07/27/23 1149)   metronidazole 500 mg (07/27/23 1026)   WJX:BJYNWGNFAOZHY **OR** acetaminophen, HYDROmorphone (DILAUDID) injection, metoprolol tartrate, ondansetron **OR** ondansetron (ZOFRAN) IV, oxyCODONE, senna-docusate  Antibiotics: Anti-infectives (From admission, onward)    Start     Dose/Rate Route Frequency Ordered Stop   07/24/23 1000  cefTRIAXone (ROCEPHIN) 2 g in sodium chloride 0.9 % 100 mL IVPB        2 g 200 mL/hr over 30 Minutes Intravenous Every  24 hours 07/23/23 1451     07/23/23 2000  metroNIDAZOLE (FLAGYL) IVPB 500 mg        500 mg 100 mL/hr over 60 Minutes Intravenous Every 12 hours 07/23/23 1451     07/23/23 0700  cefTRIAXone (ROCEPHIN) 1 g in sodium chloride 0.9 % 100 mL IVPB        1 g 200 mL/hr over 30 Minutes Intravenous  Once 07/23/23 0648  07/23/23 0741   07/23/23 0700  metroNIDAZOLE (FLAGYL) IVPB 500 mg        500 mg 100 mL/hr over 60 Minutes Intravenous  Once 07/23/23 0648 07/23/23 0741       Objective:  Vital Signs  Vitals:   07/27/23 0430 07/27/23 0500 07/27/23 0822 07/27/23 1301  BP:   (!) 154/79 (!) 137/98  Pulse: 81  100 80  Resp: (!) 26  20 19   Temp:  98.3 F (36.8 C) 97.6 F (36.4 C) 97.7 F (36.5 C)  TempSrc:  Oral Oral Oral  SpO2: 93%  93% 93%  Weight:      Height:        Intake/Output Summary (Last 24 hours) at 07/27/2023 1401 Last data filed at 07/27/2023 0205 Gross per 24 hour  Intake 400 ml  Output 1000 ml  Net -600 ml   Filed Weights   07/23/23 0353  Weight: 58.1 kg    Awake Alert, Oriented X 3, frail Symmetrical Chest wall movement, Good air movement bilaterally, CTAB RRR,No Gallops,Rubs or new Murmurs, No Parasternal Heave +ve B.Sounds, Abd Soft, right upper quadrant drain, some tenderness in epigastric and right upper quadrant area No Cyanosis, Clubbing or edema, No new Rash or bruise     Lab Results:  Data Reviewed: I have personally reviewed following labs and reports of the imaging studies  CBC: Recent Labs  Lab 07/23/23 0402 07/24/23 0434 07/24/23 1138 07/25/23 0451 07/26/23 0449 07/26/23 2350  WBC 11.7* 12.2* 12.2* 13.1* 8.5 8.3  NEUTROABS 10.9*  --   --   --   --   --   HGB 12.2 10.5* 10.3* 10.4* 10.4* 10.8*  HCT 37.6 31.9* 30.4* 31.6* 31.2* 32.1*  MCV 91.7 91.4 89.1 90.8 89.4 88.9  PLT 190 193 193 193 194 208    Basic Metabolic Panel: Recent Labs  Lab 07/23/23 0402 07/23/23 1123 07/24/23 0434 07/25/23 0451 07/26/23 0449 07/26/23 2350  NA 136  --  137 135 134* 134*  K 3.4*  --  4.1 4.2 3.8 3.6  CL 101  --  107 103 102 102  CO2 23  --  21* 22 22 24   GLUCOSE 119*  --  111* 90 108* 129*  BUN 11  --  12 13 13 10   CREATININE 1.06*  --  1.03* 0.96 0.90 0.98  CALCIUM 8.7*  --  8.1* 8.3* 8.2* 8.2*  MG  --  1.4* 2.1  --   --  1.5*  PHOS  --  1.9* 4.0   --   --   --     GFR: Estimated Creatinine Clearance: 25.3 mL/min (by C-G formula based on SCr of 0.98 mg/dL).  Liver Function Tests: Recent Labs  Lab 07/23/23 0402 07/24/23 0434 07/25/23 0451 07/26/23 0449 07/26/23 2350  AST 265* 135* 58* 24 19  ALT 107* 124* 85* 53* 40  ALKPHOS 213* 160* 147* 120 122  BILITOT 1.6* 4.0* 3.1* 2.3* 1.1  PROT 6.3* 5.4* 5.6* 5.5* 5.4*  ALBUMIN 2.9* 2.5* 2.4* 2.2* 2.2*    Recent  Labs  Lab 07/23/23 0402  LIPASE 38     Thyroid Function Tests: No results for input(s): "TSH", "T4TOTAL", "FREET4", "T3FREE", "THYROIDAB" in the last 72 hours.   Anemia Panel: No results for input(s): "VITAMINB12", "FOLATE", "FERRITIN", "TIBC", "IRON", "RETICCTPCT" in the last 72 hours.    Radiology Studies: No results found.      LOS: 4 days   [facility]  [facility] Pager on [email or web address]  07/27/2023, 2:01 PM

## 2023-07-27 NOTE — Progress Notes (Signed)
Mobility Specialist Progress Note;   07/27/23 1118  Mobility  Activity Ambulated with assistance to bathroom;Ambulated with assistance in hallway  Level of Assistance Contact guard assist, steadying assist  Assistive Device Front wheel walker  Distance Ambulated (ft) 75 ft  Activity Response Tolerated well  Mobility Referral Yes  Mobility visit 1 Mobility  Mobility Specialist Start Time (ACUTE ONLY) 1118  Mobility Specialist Stop Time (ACUTE ONLY) 1139  Mobility Specialist Time Calculation (min) (ACUTE ONLY) 21 min   Pt agreeable to mobility. Required MinG assistance during ambulation for safety. C/o abd pain towards EOS. Requested to use BR once returned to room, void successful. VSS throughout. Pt returned back to bed with all needs met. Son in room.   Caesar Bookman Mobility Specialist Please contact via SecureChat or [employer] [phone]

## 2023-07-27 NOTE — Plan of Care (Signed)

## 2023-07-27 NOTE — TOC CM/SW Note (Signed)
Transition of Care [facility]) - Inpatient Brief Assessment   Patient Details  Name: Christine Middleton MRN: [id number] Date of Birth: 01-Nov-1922  Transition of Care [facility]) CM/SW Contact:    Mearl Latin, LCSW Phone Number: 07/27/2023, 3:31 PM   Clinical Narrative: Patient admitted from home with son. TOC following for home health set up at discharge.    Transition of Care Asessment: Insurance and Status: Insurance coverage has been reviewed Patient has primary care physician: Yes Home environment has been reviewed: From home Prior level of function:: Mod Indep Prior/Current Home Services: No current home services Social Drivers of Health Review: SDOH reviewed no interventions necessary Readmission risk has been reviewed: Yes Transition of care needs: transition of care needs identified, TOC will continue to follow

## 2023-07-28 ENCOUNTER — Other Ambulatory Visit (HOSPITAL_COMMUNITY): Payer: Self-pay

## 2023-07-28 ENCOUNTER — Encounter: Payer: Self-pay | Admitting: Nurse Practitioner

## 2023-07-28 DIAGNOSIS — K819 Cholecystitis, unspecified: Secondary | ICD-10-CM | POA: Diagnosis not present

## 2023-07-28 DIAGNOSIS — R7989 Other specified abnormal findings of blood chemistry: Secondary | ICD-10-CM | POA: Diagnosis not present

## 2023-07-28 LAB — CBC
HCT: 32 % — ABNORMAL LOW (ref 36.0–46.0)
Hemoglobin: 10.7 g/dL — ABNORMAL LOW (ref 12.0–15.0)
MCH: 30.1 pg (ref 26.0–34.0)
MCHC: 33.4 g/dL (ref 30.0–36.0)
MCV: 89.9 fL (ref 80.0–100.0)
Platelets: 229 10*3/uL (ref 150–400)
RBC: 3.56 MIL/uL — ABNORMAL LOW (ref 3.87–5.11)
RDW: 13 % (ref 11.5–15.5)
WBC: 8.4 10*3/uL (ref 4.0–10.5)
nRBC: 0 % (ref 0.0–0.2)

## 2023-07-28 LAB — COMPREHENSIVE METABOLIC PANEL
ALT: 31 U/L (ref 0–44)
AST: 27 U/L (ref 15–41)
Albumin: 2.2 g/dL — ABNORMAL LOW (ref 3.5–5.0)
Alkaline Phosphatase: 115 U/L (ref 38–126)
Anion gap: 11 (ref 5–15)
BUN: 12 mg/dL (ref 8–23)
CO2: 22 mmol/L (ref 22–32)
Calcium: 8.2 mg/dL — ABNORMAL LOW (ref 8.9–10.3)
Chloride: 104 mmol/L (ref 98–111)
Creatinine, Ser: 0.9 mg/dL (ref 0.44–1.00)
GFR, Estimated: 58 mL/min — ABNORMAL LOW (ref >60)
Glucose, Bld: 136 mg/dL — ABNORMAL HIGH (ref 70–99)
Potassium: 3.7 mmol/L (ref 3.5–5.1)
Sodium: 137 mmol/L (ref 135–145)
Total Bilirubin: 0.9 mg/dL (ref <1.2)
Total Protein: 5.3 g/dL — ABNORMAL LOW (ref 6.5–8.1)

## 2023-07-28 MED ORDER — METRONIDAZOLE 500 MG PO TABS
500.0000 mg | ORAL_TABLET | Freq: Two times a day (BID) | ORAL | 0 refills | Status: AC
  Filled 2023-07-28: qty 6, 3d supply, fill #0

## 2023-07-28 MED ORDER — POLYETHYLENE GLYCOL 3350 17 GM/SCOOP PO POWD
17.0000 g | Freq: Every day | ORAL | 0 refills | Status: DC | PRN
  Filled 2023-07-28: qty 238, 14d supply, fill #0

## 2023-07-28 MED ORDER — MELATONIN 5 MG PO TABS
5.0000 mg | ORAL_TABLET | Freq: Every evening | ORAL | Status: DC | PRN
  Administered 2023-07-28: 5 mg via ORAL
  Filled 2023-07-28: qty 1

## 2023-07-28 MED ORDER — METRONIDAZOLE 500 MG PO TABS
500.0000 mg | ORAL_TABLET | Freq: Two times a day (BID) | ORAL | Status: DC
Start: 2023-07-28 — End: 2023-07-28
  Administered 2023-07-28: 500 mg via ORAL
  Filled 2023-07-28: qty 1

## 2023-07-28 MED ORDER — HYDROCODONE-ACETAMINOPHEN 5-325 MG PO TABS
1.0000 | ORAL_TABLET | Freq: Four times a day (QID) | ORAL | 0 refills | Status: DC | PRN
  Filled 2023-07-28: qty 12, 3d supply, fill #0

## 2023-07-28 MED ORDER — CEFADROXIL 500 MG PO CAPS
500.0000 mg | ORAL_CAPSULE | Freq: Two times a day (BID) | ORAL | 0 refills | Status: AC
  Filled 2023-07-28: qty 6, 3d supply, fill #0

## 2023-07-28 MED ORDER — SODIUM CHLORIDE FLUSH 0.9 % IV SOLN
10.0000 mL | Freq: Every day | INTRAVENOUS | 3 refills | Status: DC
  Filled 2023-07-28: qty 300, 30d supply, fill #0

## 2023-07-28 MED ORDER — CEPHALEXIN 500 MG PO CAPS: 500.0000 mg | ORAL_CAPSULE | Freq: Two times a day (BID) | ORAL | Status: DC

## 2023-07-28 MED ORDER — MIRTAZAPINE 15 MG PO TABS
15.0000 mg | ORAL_TABLET | Freq: Every day | ORAL | 0 refills | Status: DC
  Filled 2023-07-28: qty 30, 30d supply, fill #0

## 2023-07-28 MED ORDER — CEFADROXIL 500 MG PO CAPS
500.0000 mg | ORAL_CAPSULE | Freq: Two times a day (BID) | ORAL | Status: DC
  Administered 2023-07-28: 500 mg via ORAL
  Filled 2023-07-28 (×2): qty 1

## 2023-07-28 NOTE — TOC Transition Note (Signed)
Transition of Care [facility]) - Discharge Note   Patient Details  Name: Christine Middleton MRN: [id number] Date of Birth: August 14, 1922  Transition of Care [facility]) CM/SW Contact:  Lawerance Sabal, RN Phone Number: 07/28/2023, 10:57 AM   Clinical Narrative:     Sherron Monday w patient's son Gerlene Burdock who was at bedside.  Agreeable to [facility] services, recently had [facility] in October and he would like to have them again. [facility] accepted  No DME needs  PCP and address verified.  Family to transport home.    Final next level of care: Home w Home Health Services Barriers to Discharge: No Barriers Identified   Patient Goals and CMS Choice Patient states their goals for this hospitalization and ongoing recovery are:: to go home CMS Medicare.gov Compare Post Acute Care list provided to:: Other (Comment Required) Choice offered to / list presented to : Adult Children      Discharge Placement                       Discharge Plan and Services Additional resources added to the After Visit Summary for                  DME Arranged: N/A         HH Arranged: PT, RN HH Agency: [facility] Date [facility] Agency Contacted: 07/28/23 Time HH Agency Contacted: 1056 Representative spoke with at [facility] Agency: Arline Asp  Social Drivers of Health (SDOH) Interventions SDOH Screenings   Food Insecurity: No Food Insecurity (07/23/2023)  Housing: Low Risk  (07/23/2023)  Transportation Needs: No Transportation Needs (07/23/2023)  Utilities: Not At Risk (07/23/2023)  Alcohol Screen: Low Risk  (06/27/2023)  Depression (PHQ2-9): Low Risk  (06/27/2023)  Financial Resource Strain: Low Risk  (06/27/2023)  Physical Activity: Insufficiently Active (06/27/2023)  Social Connections: Moderately Integrated (06/27/2023)  Stress: No Stress Concern Present (06/27/2023)  Tobacco Use: Low Risk  (07/24/2023)  Health Literacy: Adequate Health Literacy (06/27/2023)     Readmission Risk Interventions     No data to display

## 2023-07-28 NOTE — Discharge Summary (Signed)
Physician Discharge Summary  Christine Middleton ZOX:[id number] DOB: [birth year] DOA: 07/23/2023  PCP: Nelwyn Salisbury, MD  Admit date: 07/23/2023 Discharge date: 07/28/2023  Admitted From: (Home) Disposition:  (Home)  Recommendations for Outpatient Follow-up:  Follow up with PCP in 1-2 weeks Please obtain BMP/CBC in one week  Home Health: (YES) PT & RN  Discharge Condition: (Stable) Diet recommendation: Heart Healthy    Brief/Interim Summary:  Brief History:   89 y.o. female with medical history significant of HTN, GERD, HLD, CVA, recent right hip fracture in August, paroxysmal A-fib, who presented to the ED for evaluation of right-sided abdominal pain with nausea and vomiting.  Concern was for cholecystitis.  She was hospitalized for further management.  Initially she was reluctant to undergo any kind of procedures.  She also had 1 episode of rectal bleeding.  She had cholecystostomy drain placed by IR, and much improved on IV antibiotics, please see discussion below.   Consultants: General surgery.  IR, palliative care   Procedures: cholecystostomy drain by IR    Acute cholecystitis -Presented with abdominal pain and was found to have abnormal LFTs.  Imaging studies raise concern for cholecystitis.  General surgery was consulted.   -Patient was very reluctant to undergo surgical procedures.  Agreed to cholecystostomy drain tube placement which was done by IR on 12/24.  IR will arrange follow-up in their drain clinic. -General surgery has signed off. -Was treated with IV Rocephin and metronidazole during hospital stay for total of 6 days, she is to finish another 3 days of oral Keflex and Flagyl as an outpatient (has penicillin allergy) -Cultures from the gallbladder negative so far. -Abdominal pain much improved, did not require any pain meds over last 48 hours, but appetite remains poor, son will try to encourage oral intake, fluid intake and supplements intake at home -LFTs have  improved.  WBC is normal.   Hematochezia Had bright red blood per rectum overnight on 12/23.  Possibly due to hemorrhoids seen on colonoscopy done in 2020.   Hydrocortisone suppositories ordered.  No further bleeding.  Hemoglobin is stable.     Normocytic anemia Drop in hemoglobin likely combination of dilution as well as hematochezia.  Hemoglobin is stable.   History of paroxysmal atrial fibrillation She is not on anticoagulation.  Experiencing episodes of RVR possibly related to acute pain issues. Continue with atenolol.    Hypokalemia/hypomagnesemia/hyponatremia Supplemented.     Essential hypertension Continue home medications.   History of GERD Continue PPI   Insomnia and depression Continue mirtazapine.   Goals of care Palliative care following.  He is DNR  Discharge Diagnoses:  Principal Problem:   Cholecystitis Active Problems:   RUQ pain   Elevated LFTs    Discharge Instructions  Discharge Instructions     Diet - low sodium heart healthy   Complete by: As directed    Discharge instructions   Complete by: As directed    Follow with Primary MD Nelwyn Salisbury, MD in 7 days   Get CBC, CMP,checked  by Primary MD next visit.    Activity: As tolerated with Full fall precautions use walker/cane & assistance as needed   Disposition Home    Diet: Low fat diet   On your next visit with your primary care physician please Get Medicines reviewed and adjusted.   Please request your Prim.MD to go over all Hospital Tests and Procedure/Radiological results at the follow up, please get all Hospital records sent to your Prim MD by signing  hospital release before you go home.   If you experience worsening of your admission symptoms, develop shortness of breath, life threatening emergency, suicidal or homicidal thoughts you must seek medical attention immediately by calling 911 or calling your MD immediately  if symptoms less severe.  You Must read complete  instructions/literature along with all the possible adverse reactions/side effects for all the Medicines you take and that have been prescribed to you. Take any new Medicines after you have completely understood and accpet all the possible adverse reactions/side effects.   Do not drive, operating heavy machinery, perform activities at heights, swimming or participation in water activities or provide baby sitting services if your were admitted for syncope or siezures until you have seen by Primary MD or a Neurologist and advised to do so again.  Do not drive when taking Pain medications.    Do not take more than prescribed Pain, Sleep and Anxiety Medications  Special Instructions: If you have smoked or chewed Tobacco  in the last 2 yrs please stop smoking, stop any regular Alcohol  and or any Recreational drug use.  Wear Seat belts while driving.   Please note  You were cared for by a hospitalist during your hospital stay. If you have any questions about your discharge medications or the care you received while you were in the hospital after you are discharged, you can call the unit and asked to speak with the hospitalist on call if the hospitalist that took care of you is not available. Once you are discharged, your primary care physician will handle any further medical issues. Please note that NO REFILLS for any discharge medications will be authorized once you are discharged, as it is imperative that you return to your primary care physician (or establish a relationship with a primary care physician if you do not have one) for your aftercare needs so that they can reassess your need for medications and monitor your lab values.   Increase activity slowly   Complete by: As directed       Allergies as of 07/28/2023       Reactions   Codeine Other (See Comments)   Unknown reaction   Lipitor [atorvastatin] Other (See Comments)   Myalgias  Joint stiffness   Macrobid [nitrofurantoin] Other  (See Comments)   Unknown reaction   Zestril [lisinopril] Other (See Comments)   Unknown reaction   Amoxil [amoxicillin] Rash   Ultram [tramadol] Anxiety, Other (See Comments)   Sedation Worsening depression        Medication List     TAKE these medications    atenolol 50 MG tablet Commonly known as: TENORMIN TAKE 1 TABLET BY MOUTH EVERY DAY   cholecalciferol 25 MCG (1000 UNIT) tablet Commonly known as: VITAMIN D3 Take 1,000 Units by mouth every evening.   docusate sodium 100 MG capsule Commonly known as: COLACE Take 2 capsules (200 mg total) by mouth 2 (two) times daily.   HYDROcodone-acetaminophen 5-325 MG tablet Commonly known as: NORCO/VICODIN Take 1 tablet by mouth every 6 (six) hours as needed for severe pain (pain score 7-10).   Iron (Ferrous Sulfate) 325 (65 Fe) MG Tabs Take 325 mg by mouth daily.   lactose free nutrition Liqd Take 237 mLs by mouth 3 (three) times daily between meals.   losartan 25 MG tablet Commonly known as: COZAAR Take 1 tablet (25 mg total) by mouth every evening.   mirtazapine 15 MG tablet Commonly known as: REMERON Take 1 tablet (15 mg  total) by mouth at bedtime. Take 1/2 (7.5 mg) tablet by mouth at bedtime What changed:  how much to take how to take this when to take this   pantoprazole 40 MG tablet Commonly known as: PROTONIX TAKE 1 TABLET BY MOUTH EVERY DAY   polyethylene glycol 17 g packet Commonly known as: MiraLax Take 17 g by mouth daily as needed.   sodium chloride flush 0.9 % Soln injection 10 mLs by Intracatheter route daily. Flush drain once daily with 10 mL normal saline        Follow-up Information     Moise Boring, MD. Call.   Specialty: General Surgery Why: As needed to discuss gallbladder removal if desired Contact information: [address] [place] [address] [phone]         [facility] Follow up.   Why: Please follow up with  Interventional Radiology. A scheduler will call you to arrange a date and time. Please call our clinic with questions or concerns prior to your visit. Contact information: [address] [place] [address] [phone]                Allergies  Allergen Reactions   Codeine Other (See Comments)    Unknown reaction   Lipitor [Atorvastatin] Other (See Comments)    Myalgias  Joint stiffness   Macrobid [Nitrofurantoin] Other (See Comments)    Unknown reaction   Zestril [Lisinopril] Other (See Comments)    Unknown reaction   Amoxil [Amoxicillin] Rash   Ultram [Tramadol] Anxiety and Other (See Comments)    Sedation Worsening depression      Procedures/Studies: IR Perc Cholecystostomy Result Date: 07/24/2023 INDICATION: Acute cholecystitis EXAM: ULTRASOUND AND FLUOROSCOPIC-GUIDED CHOLECYSTOSTOMY TUBE PLACEMENT COMPARISON:  None Available. MEDICATIONS: The patient is currently admitted to the hospital and on intravenous antibiotics. Antibiotics were administered within an appropriate time frame prior to skin puncture. ANESTHESIA/SEDATION: Moderate (conscious) sedation was employed during this procedure. A total of Versed 0.5 mg and Fentanyl 25 mcg was administered intravenously. Moderate Sedation Time: 29 minutes. The patient's level of consciousness and vital signs were monitored continuously by radiology nursing throughout the procedure under my direct supervision. CONTRAST:  20mL OMNIPAQUE IOHEXOL 300 MG/ML SOLN - administered into the gallbladder fossa. FLUOROSCOPY TIME:  Fluoroscopic dose; 14 mGy COMPLICATIONS: None immediate. PROCEDURE: Informed written consent was obtained from the patient after a discussion of the risks, benefits and alternatives to treatment. Questions regarding the procedure were encouraged and answered. A timeout was performed prior to the initiation of the procedure. The right upper abdominal quadrant was prepped and draped in the usual sterile fashion,  and a sterile drape was applied covering the operative field. Maximum barrier sterile technique with sterile gowns and gloves were used for the procedure. A timeout was performed prior to the initiation of the procedure. Local anesthesia was provided with 1% lidocaine with epinephrine. Ultrasound scanning of the right upper quadrant demonstrates a markedly dilated gallbladder. Utilizing a transhepatic approach, a 22 gauge needle was advanced into the gallbladder under direct ultrasound guidance. An ultrasound image was saved for documentation purposes. Appropriate intraluminal puncture was confirmed with the efflux of bile and advancement of an 0.018 wire into the gallbladder lumen. The needle was exchanged for an Accustick set. A small amount of contrast was injected to confirm appropriate intraluminal positioning. Over a Benson wire, a 10 Fr cholecystomy tube was advanced into the gallbladder fossa, coiled and locked. Bile was aspirated and a  small amount of contrast was injected as several post procedural spot radiographic images were obtained in various obliquities. The catheter was secured to the skin with suture, connected to a drainage bag and a dressing was placed. The patient tolerated the procedure well without immediate post procedural complication. IMPRESSION: Successful ultrasound and fluoroscopic guided placement of a 10 Fr cholecystostomy tube. RECOMMENDATIONS: The patient will return to [facility]iology ([facility]) for routine drainage catheter evaluation and exchange in 8 weeks. Roanna Banning, MD [facility] Electronically Signed   By: Roanna Banning M.D.   On: 07/24/2023 16:24   US Abdomen Limited RUQ (LIVER/GB) Result Date: 07/23/2023 CLINICAL DATA:  Right upper quadrant pain EXAM: ULTRASOUND ABDOMEN LIMITED RIGHT UPPER QUADRANT COMPARISON:  Abdominal CT from earlier today FINDINGS: Gallbladder: There is pericholecystic fluid  but no focal tenderness or detected calculi. Common bile duct: Diameter: 5 mm Liver: Solitary, known partially shadowing mass in the right lobe liver, present for multiple years by CT, favoring a benign process such as hyalinized hemangioma. Portal vein is patent on color Doppler imaging with normal direction of blood flow towards the liver. IMPRESSION: Pericholecystic fluid, presumably reactive as no detected calculi or sonographic Murphy sign. Electronically Signed   By: Tiburcio Pea M.D.   On: 07/23/2023 07:44   CT ABDOMEN PELVIS W CONTRAST Result Date: 07/23/2023 CLINICAL DATA:  Right-sided abdominal pain with nausea and vomiting that started tonight. EXAM: CT ABDOMEN AND PELVIS WITH CONTRAST TECHNIQUE: Multidetector CT imaging of the abdomen and pelvis was performed using the standard protocol following bolus administration of intravenous contrast. RADIATION DOSE REDUCTION: This exam was performed according to the departmental dose-optimization program which includes automated exposure control, adjustment of the mA and/or kV according to patient size and/or use of iterative reconstruction technique. CONTRAST:  60mL OMNIPAQUE IOHEXOL 350 MG/ML SOLN COMPARISON:  12/19/2022 FINDINGS: Lower chest: Borderline interlobular septal thickening at the lung bases. Stable mild GE junction esophageal thickening favoring hiatal hernia rather than acute inflammation. Hepatobiliary: Dystrophic calcification within a 17 mm right liver mass, possibly hyalinized hemangioma given multiyear stability, still stable.Indistinct gallbladder wall with possible faint calcification of internal calculi. No biliary dilatation or calcified choledocholithiasis. Pancreas: Unremarkable. Spleen: Lobulation of the capsule attributed to scarring. Adrenals/Urinary Tract: Negative adrenals. No hydronephrosis or stone. Left renal cortical thinning which is patchy in from scarring. Milder right renal cortical scarring. Unremarkable bladder.  Stomach/Bowel: No obstruction. No visible bowel inflammation. Distal colonic diverticulosis and moderate stool. Vascular/Lymphatic: No acute vascular abnormality. Extensive atheromatous calcification of the aorta and iliacs. No mass or adenopathy. Reproductive:Hysterectomy. Other: No ascites or pneumoperitoneum. Musculoskeletal: Proximal right femur fracture repair without complicating features. L1-L3 remote compression fractures with advanced height loss at L1 where there has been interval healing of the fracture cleft containing gas. IMPRESSION: 1. Borderline thickening of the gallbladder which could contain calculi, correlate for cholecystitis symptoms. 2. Chronic findings are stable from prior and described above. Electronically Signed   By: Tiburcio Pea M.D.   On: 07/23/2023 06:44   XR FEMUR, MIN 2 VIEWS RIGHT Result Date: 07/05/2023 X-rays of the right femur taken 07/05/2023 were independently reviewed and interpreted, showing cephalomedullary rod in appropriate position.  Fracture is reduced.  Lag screws are well centered within the femoral head.  No lucency seen around the screws.  No new fracture noted.     Subjective: No significant events overnight as discussed with staff, overall her appetite remains poor, abdominal pain much improved did not require  any pain meds  Discharge Exam: Vitals:   07/28/23 0733 07/28/23 0800  BP: (!) 142/90 (!) 146/85  Pulse: 93 88  Resp: 20 20  Temp: 97.6 F (36.4 C) 97.6 F (36.4 C)  SpO2: 91%    Vitals:   07/28/23 0018 07/28/23 0441 07/28/23 0733 07/28/23 0800  BP: (!) 149/77 (!) 158/77 (!) 142/90 (!) 146/85  Pulse:  80 93 88  Resp: 18 16 20 20   Temp: 97.7 F (36.5 C) (!) 97.2 F (36.2 C) 97.6 F (36.4 C) 97.6 F (36.4 C)  TempSrc: Oral Axillary Axillary Axillary  SpO2: 93% 90% 91%   Weight:      Height:        General: Pt is alert, awake, not in acute distress, frail Cardiovascular: RRR, S1/S2 +, no rubs, no gallops Respiratory:  CTA bilaterally, no wheezing, no rhonchi Abdominal: Soft, NT, ND, bowel sounds +, right upper quadrant drain present Extremities: no edema, no cyanosis    The results of significant diagnostics from this hospitalization (including imaging, microbiology, ancillary and laboratory) are listed below for reference.     Microbiology: Recent Results (from the past 240 hours)  Aerobic/Anaerobic Culture w Gram Stain (surgical/deep wound)     Status: None (Preliminary result)   Collection Time: 07/24/23  2:23 PM   Specimen: BILE  Result Value Ref Range Status   Specimen Description BILE  Final   Special Requests NONE  Final   Gram Stain   Final    NO WBC SEEN RARE GRAM NEGATIVE RODS RARE GRAM POSITIVE COCCI RARE GRAM POSITIVE RODS Performed at [facility], [address], [place], [place] [address]    Culture   Final    MODERATE MULTIPLE ORGANISMS PRESENT, NONE PREDOMINANT NO ANAEROBES ISOLATED; CULTURE IN PROGRESS FOR 5 DAYS    Report Status PENDING  Incomplete     Labs: BNP (last 3 results) Recent Labs    12/21/22 0211 12/22/22 0410  BNP 295.1* 276.3*   Basic Metabolic Panel: Recent Labs  Lab 07/23/23 1123 07/24/23 0434 07/25/23 0451 07/26/23 0449 07/26/23 2350 07/28/23 0346  NA  --  137 135 134* 134* 137  K  --  4.1 4.2 3.8 3.6 3.7  CL  --  107 103 102 102 104  CO2  --  21* 22 22 24 22   GLUCOSE  --  111* 90 108* 129* 136*  BUN  --  12 13 13 10 12   CREATININE  --  1.03* 0.96 0.90 0.98 0.90  CALCIUM  --  8.1* 8.3* 8.2* 8.2* 8.2*  MG 1.4* 2.1  --   --  1.5*  --   PHOS 1.9* 4.0  --   --   --   --    Liver Function Tests: Recent Labs  Lab 07/24/23 0434 07/25/23 0451 07/26/23 0449 07/26/23 2350 07/28/23 0346  AST 135* 58* 24 19 27   ALT 124* 85* 53* 40 31  ALKPHOS 160* 147* 120 122 115  BILITOT 4.0* 3.1* 2.3* 1.1 0.9  PROT 5.4* 5.6* 5.5* 5.4* 5.3*  ALBUMIN 2.5* 2.4* 2.2* 2.2* 2.2*   Recent Labs  Lab 07/23/23 0402  LIPASE 38   No results for  input(s): "AMMONIA" in the last 168 hours. CBC: Recent Labs  Lab 07/23/23 0402 07/24/23 0434 07/24/23 1138 07/25/23 0451 07/26/23 0449 07/26/23 2350 07/28/23 0346  WBC 11.7*   < > 12.2* 13.1* 8.5 8.3 8.4  NEUTROABS 10.9*  --   --   --   --   --   --  HGB 12.2   < > 10.3* 10.4* 10.4* 10.8* 10.7*  HCT 37.6   < > 30.4* 31.6* 31.2* 32.1* 32.0*  MCV 91.7   < > 89.1 90.8 89.4 88.9 89.9  PLT 190   < > 193 193 194 208 229   < > = values in this interval not displayed.   Cardiac Enzymes: No results for input(s): "CKTOTAL", "CKMB", "CKMBINDEX", "TROPONINI" in the last 168 hours. BNP: Invalid input(s): "POCBNP" CBG: No results for input(s): "GLUCAP" in the last 168 hours. D-Dimer No results for input(s): "DDIMER" in the last 72 hours. Hgb A1c No results for input(s): "HGBA1C" in the last 72 hours. Lipid Profile No results for input(s): "CHOL", "HDL", "LDLCALC", "TRIG", "CHOLHDL", "LDLDIRECT" in the last 72 hours. Thyroid function studies No results for input(s): "TSH", "T4TOTAL", "T3FREE", "THYROIDAB" in the last 72 hours.  Invalid input(s): "FREET3" Anemia work up No results for input(s): "VITAMINB12", "FOLATE", "FERRITIN", "TIBC", "IRON", "RETICCTPCT" in the last 72 hours. Urinalysis    Component Value Date/Time   COLORURINE YELLOW 01/17/2021 1937   APPEARANCEUR CLEAR 01/17/2021 1937   LABSPEC 1.006 01/17/2021 1937   PHURINE 7.0 01/17/2021 1937   GLUCOSEU NEGATIVE 01/17/2021 1937   HGBUR NEGATIVE 01/17/2021 1937   HGBUR negative 08/31/2009 0927   BILIRUBINUR NEGATIVE 01/17/2021 1937   BILIRUBINUR neg 10/02/2018 1058   KETONESUR NEGATIVE 01/17/2021 1937   PROTEINUR NEGATIVE 01/17/2021 1937   UROBILINOGEN 0.2 10/02/2018 1058   UROBILINOGEN 0.2 10/04/2013 2030   NITRITE NEGATIVE 01/17/2021 1937   LEUKOCYTESUR TRACE (A) 01/17/2021 1937   Sepsis Labs Recent Labs  Lab 07/25/23 0451 07/26/23 0449 07/26/23 2350 07/28/23 0346  WBC 13.1* 8.5 8.3 8.4    Microbiology Recent Results (from the past 240 hours)  Aerobic/Anaerobic Culture w Gram Stain (surgical/deep wound)     Status: None (Preliminary result)   Collection Time: 07/24/23  2:23 PM   Specimen: BILE  Result Value Ref Range Status   Specimen Description BILE  Final   Special Requests NONE  Final   Gram Stain   Final    NO WBC SEEN RARE GRAM NEGATIVE RODS RARE GRAM POSITIVE COCCI RARE GRAM POSITIVE RODS Performed at [facility], [address], [place], [place] [address]    Culture   Final    MODERATE MULTIPLE ORGANISMS PRESENT, NONE PREDOMINANT NO ANAEROBES ISOLATED; CULTURE IN PROGRESS FOR 5 DAYS    Report Status PENDING  Incomplete     Time coordinating discharge: Over 30 minutes  SIGNED:   Huey Bienenstock, MD  [facility] 07/28/2023, 10:34 AM Pager   If 7PM-7AM, please contact night-coverage [email or web address] Password [email or web address]

## 2023-07-28 NOTE — Progress Notes (Addendum)
Referring Physician(s): Trixie Deis, PA-C    Supervising Physician: Gilmer Mor   Patient Status:  [facility] - In-pt   Chief Complaint: Acute cholecystitis s/p percutaneous cholecystostomy placement 07/24/23 in IR    Subjective:   Patient was resting comfortably in reclining chair on rounds. Her son remains at her side. She had a small portion of her breakfast this morning. She is excited to be discharged. Patient is being set up with home health care.  Allergies: Codeine, Lipitor [atorvastatin], Macrobid [nitrofurantoin], Zestril [lisinopril], Amoxil [amoxicillin], and Ultram [tramadol]  Medications: Prior to Admission medications   Medication Sig Start Date End Date Taking? Authorizing Provider  atenolol (TENORMIN) 50 MG tablet TAKE 1 TABLET BY MOUTH EVERY DAY 02/06/23  Yes Nelwyn Salisbury, MD  cholecalciferol (VITAMIN D3) 25 MCG (1000 UNIT) tablet Take 1,000 Units by mouth every evening.   Yes [provider]  docusate sodium (COLACE) 100 MG capsule Take 2 capsules (200 mg total) by mouth 2 (two) times daily. 12/23/22  Yes Leroy Sea, MD  HYDROcodone-acetaminophen (NORCO/VICODIN) 5-325 MG tablet Take 1 tablet by mouth every 6 (six) hours as needed for severe pain (pain score 7-10). 07/28/23  Yes Elgergawy, Leana Roe, MD  Iron, Ferrous Sulfate, 325 (65 Fe) MG TABS Take 325 mg by mouth daily. 01/10/23  Yes Nelwyn Salisbury, MD  losartan (COZAAR) 25 MG tablet Take 1 tablet (25 mg total) by mouth every evening. 11/14/22  Yes Nelwyn Salisbury, MD  pantoprazole (PROTONIX) 40 MG tablet TAKE 1 TABLET BY MOUTH EVERY DAY 04/10/23  Yes Nelwyn Salisbury, MD  polyethylene glycol powder (GAVILAX) 17 GM/SCOOP powder Take 17 g by mouth daily as needed. 07/28/23  Yes Elgergawy, Leana Roe, MD  sodium chloride flush 0.9 % SOLN injection Use 10 mLs by Intracatheter route daily. Flush drain once daily with 10 mL normal saline 07/28/23  Yes Pasqual Farias A, PA-C  lactose free nutrition (BOOST) LIQD  Take 237 mLs by mouth 3 (three) times daily between meals.    [provider]  mirtazapine (REMERON) 15 MG tablet Take 1 tablet (15 mg total) by mouth at bedtime. 07/28/23   Elgergawy, Leana Roe, MD     Vital Signs: BP (!) 146/85 (BP Location: Right Arm)   Pulse 88   Temp 97.6 F (36.4 C) (Axillary)   Resp 20   Ht 5\' 2"  (1.575 m)   Wt 128 lb 1.4 oz (58.1 kg)   SpO2 91%   BMI 23.43 kg/m   Physical Exam Pulmonary:     Effort: Pulmonary effort is normal.  Abdominal:     Tenderness: There is abdominal tenderness in the right upper quadrant.     Comments: RUQ drain to gravity with approximately 100 cc of bilious output in bag. Drain insertion site clean, dry, intact, minimally tender to palpation, and dressed appropriately.   Skin:    General: Skin is warm and dry.  Neurological:     Mental Status: She is alert and oriented to person, place, and time.      Labs:  CBC: Recent Labs    07/25/23 0451 07/26/23 0449 07/26/23 2350 07/28/23 0346  WBC 13.1* 8.5 8.3 8.4  HGB 10.4* 10.4* 10.8* 10.7*  HCT 31.6* 31.2* 32.1* 32.0*  PLT 193 194 208 229    COAGS: Recent Labs    12/19/22 1922 03/15/23 2248 03/16/23 1424 03/17/23 0154 07/24/23 1138 07/25/23 0451  INR 1.1 1.3*  --   --  1.2 1.2  APTT  --   --  32 106*  --   --     BMP: Recent Labs    07/25/23 0451 07/26/23 0449 07/26/23 2350 07/28/23 0346  NA 135 134* 134* 137  K 4.2 3.8 3.6 3.7  CL 103 102 102 104  CO2 22 22 24 22   GLUCOSE 90 108* 129* 136*  BUN 13 13 10 12   CALCIUM 8.3* 8.2* 8.2* 8.2*  CREATININE 0.96 0.90 0.98 0.90  GFRNONAA 53* 58* 52* 58*    LIVER FUNCTION TESTS: Recent Labs    07/25/23 0451 07/26/23 0449 07/26/23 2350 07/28/23 0346  BILITOT 3.1* 2.3* 1.1 0.9  AST 58* 24 19 27   ALT 85* 53* 40 31  ALKPHOS 147* 120 122 115  PROT 5.6* 5.5* 5.4* 5.3*  ALBUMIN 2.4* 2.2* 2.2* 2.2*    Assessment and Plan:   89 y/o F admitted with acute cholecystitis who underwent  percutaneous cholecystostomy placement 07/24/23 in IR. Seen today for routine drain follow up, as well as teaching for outpatient/home drain care and management. Patient expected for discharge today.   Drain Location: RUQ Size: Fr size: 10 Fr Date of placement: 07/24/2023  Currently to: Drain collection device: gravity  24 hour output:  Output by Drain (mL) 07/26/23 0701 - 07/26/23 1900 07/26/23 1901 - 07/27/23 0700 07/27/23 0701 - 07/27/23 1900 07/27/23 1901 - 07/28/23 0700 07/28/23 0701 - 07/28/23 1111  Biliary Tube Cook slip-coat 10.2 Fr. RUQ  150 200 350     Interval imaging/drain manipulation: None  Current examination: Flushes/aspirates easily.  Insertion site unremarkable. Suture and stat lock in place. Dressed appropriately.   Plan:  Continue TID flushes with 5 cc NS. Record output Q shift. Dressing changes QD or PRN if soiled.  Call IR APP or on call IR MD if difficulty flushing or sudden change in drain output.  Repeat imaging/possible drain injection once output < 10 mL/QD (excluding flush material). Consideration for drain removal if output is < 10 mL/QD (excluding flush material), pending discussion with the providing surgical service.  Discharge planning: Percutaneous cholecystostomy drain to remain in place at least 6 weeks.    Recommend fluoroscopy with injection of the drain in IR to evaluate for patency of the cystic duct.   If the duct is patent and general surgery feels patient is stable for cholecystectomy, the drain would be removed at time of surgery.   If the duct is patent and general surgery feels patient is NEVER a candidate for cholecystectomy, drain can be capped for a trial.   If symptoms recur, then place to gravity bag again.   If trial is successful, discuss possible removal of the drain.   If trial is unsuccessful, then patient will need routine exchanges of the chole tube about every 8-10 weeks.   Discharge instructions placed, and teaching  performed at bedside with patient and her son. All questions answered. Patient knows to expect call from schedulers in 6-8 weeks for follow up, or to call sooner with questions or concerns.   IR will continue to follow - please call with questions or concerns.    Electronically Signed: Sable Feil, PA-C 07/28/2023, 11:08 AM     I spent a total of 25 Minutes at the the patient's bedside AND on the patient's hospital floor or unit, greater than 50% of which was counseling/coordinating care for acute cholecystitis.

## 2023-07-28 NOTE — Plan of Care (Signed)

## 2023-07-28 NOTE — Progress Notes (Signed)
AVS reviewed with pt's son Gerlene Burdock, by this Clinical research associate, primary [employer]. Prescriptions received by pt's son at bedside. All questions answered by son, and son verbalized understanding of discharge instructions. PIV removed and documented. Pt left unit in NAD, with her son taking her home in private vehicle.

## 2023-07-28 NOTE — Discharge Instructions (Signed)
Follow with Primary MD Nelwyn Salisbury, MD in 7 days   Get CBC, CMP,checked  by Primary MD next visit.    Activity: As tolerated with Full fall precautions use walker/cane & assistance as needed   Disposition Home    Diet: Low fat diet   On your next visit with your primary care physician please Get Medicines reviewed and adjusted.   Please request your Prim.MD to go over all Hospital Tests and Procedure/Radiological results at the follow up, please get all Hospital records sent to your Prim MD by signing hospital release before you go home.   If you experience worsening of your admission symptoms, develop shortness of breath, life threatening emergency, suicidal or homicidal thoughts you must seek medical attention immediately by calling 911 or calling your MD immediately  if symptoms less severe.  You Must read complete instructions/literature along with all the possible adverse reactions/side effects for all the Medicines you take and that have been prescribed to you. Take any new Medicines after you have completely understood and accpet all the possible adverse reactions/side effects.   Do not drive, operating heavy machinery, perform activities at heights, swimming or participation in water activities or provide baby sitting services if your were admitted for syncope or siezures until you have seen by Primary MD or a Neurologist and advised to do so again.  Do not drive when taking Pain medications.    Do not take more than prescribed Pain, Sleep and Anxiety Medications  Special Instructions: If you have smoked or chewed Tobacco  in the last 2 yrs please stop smoking, stop any regular Alcohol  and or any Recreational drug use.  Wear Seat belts while driving.   Please note  You were cared for by a hospitalist during your hospital stay. If you have any questions about your discharge medications or the care you received while you were in the hospital after you are discharged,  you can call the unit and asked to speak with the hospitalist on call if the hospitalist that took care of you is not available. Once you are discharged, your primary care physician will handle any further medical issues. Please note that NO REFILLS for any discharge medications will be authorized once you are discharged, as it is imperative that you return to your primary care physician (or establish a relationship with a primary care physician if you do not have one) for your aftercare needs so that they can reassess your need for medications and monitor your lab values.

## 2023-07-29 LAB — AEROBIC/ANAEROBIC CULTURE W GRAM STAIN (SURGICAL/DEEP WOUND): Gram Stain: NONE SEEN

## 2023-07-30 ENCOUNTER — Telehealth: Payer: Self-pay

## 2023-07-30 NOTE — Transitions of Care (Post Inpatient/ED Visit) (Signed)
07/30/2023  Name: Christine Middleton MRN: [id number] DOB: [birth year]  Today's TOC FU Call Status: Today's TOC FU Call Status:: Successful TOC FU Call Completed TOC FU Call Complete Date: 07/30/23 (call completed with son-Edward) Patient's Name and Date of Birth confirmed.  Transition Care Management Follow-up Telephone Call Date of Discharge: 07/28/23 Discharge Facility: [facility]) Type of Discharge: Inpatient Admission Primary Inpatient Discharge Diagnosis:: "RUQ pain" How have you been since you were released from the hospital?: Better (Son states pt doing ok-denies pt having any pain, N&V or GI sxs, appetite remains decreased-eating plentyof fruit & fluids-drinking one shake per day-will try increasing shake-unsure about BM, drain intake-has been flushed-no issue) Any questions or concerns?: No  Items Reviewed: Did you receive and understand the discharge instructions provided?: Yes Medications obtained,verified, and reconciled?: Partial Review Completed Reason for Partial Mediation Review: son voices that meds are in med planner does not have list nearby-reviewed med changes per dc summary-he is not with pt all the time as other siblings assist with her care-unsure if pt has taken any pain med or bowel regimen meds Any new allergies since your discharge?: No Dietary orders reviewed?: Yes Type of Diet Ordered:: low salt/heart healthy/low fat Do you have support at home?: Yes People in Home: child(ren), adult Name of Support/Comfort Primary Source: adult children assist with care  Medications Reviewed Today: Medications Reviewed Today     Reviewed by Charlyn Minerva, RN (Registered Nurse) on 07/30/23 at [phone]  Med List Status: <None>   Medication Order Taking? Sig Documenting Provider Last Dose Status Informant  atenolol (TENORMIN) 50 MG tablet [id number] Yes TAKE 1 TABLET BY MOUTH EVERY DAY Nelwyn Salisbury, MD 07/30/2023 Morning Active Family Member, Pharmacy Records   cefadroxil (DURICEF) 500 MG capsule [id number] Yes Take 1 capsule (500 mg total) by mouth 2 (two) times daily for 3 days. Elgergawy, Leana Roe, MD 07/30/2023 Morning Active   cholecalciferol (VITAMIN D3) 25 MCG (1000 UNIT) tablet [id number] Yes Take 1,000 Units by mouth every evening. [provider] 07/29/2023 Active Family Member, Pharmacy Records  docusate sodium (COLACE) 100 MG capsule [id number] Yes Take 2 capsules (200 mg total) by mouth 2 (two) times daily. Leroy Sea, MD 07/29/2023 Active Family Member, Pharmacy Records  HYDROcodone-acetaminophen (NORCO/VICODIN) 5-325 MG tablet [id number]  Take 1 tablet by mouth every 6 (six) hours as needed for severe pain (pain score 7-10). Elgergawy, Leana Roe, MD  Active   Iron, Ferrous Sulfate, 325 (65 Fe) MG TABS [id number]  Take 325 mg by mouth daily. Nelwyn Salisbury, MD  Active Family Member, Pharmacy Records  lactose free nutrition [place]) [place] [id number] Yes Take 237 mLs by mouth 3 (three) times daily between meals. [provider] 07/30/2023 Morning Active Family Member, Pharmacy Records  losartan (COZAAR) 25 MG tablet [id number] Yes Take 1 tablet (25 mg total) by mouth every evening. Nelwyn Salisbury, MD 07/29/2023 Active Family Member, Pharmacy Records  metroNIDAZOLE (FLAGYL) 500 MG tablet [id number] Yes Take 1 tablet (500 mg total) by mouth every 12 (twelve) hours for 3 days. Elgergawy, Leana Roe, MD 07/30/2023 Morning Active   mirtazapine (REMERON) 15 MG tablet [id number]  Take 1 tablet (15 mg total) by mouth at bedtime. Elgergawy, Leana Roe, MD  Active   pantoprazole (PROTONIX) 40 MG tablet [id number] Yes TAKE 1 TABLET BY MOUTH EVERY DAY Nelwyn Salisbury, MD 07/30/2023 Morning Active Family Member, Pharmacy Records  polyethylene glycol powder (GAVILAX) 17 GM/SCOOP powder [id number]  Dissolve 1 capful (17 g)  in water and take by mouth daily as needed. Elgergawy, Leana Roe, MD  Active   sodium chloride flush 0.9 % SOLN injection [id number] Yes  Use 10 mLs by Intracatheter route daily. Flush drain once daily with 10 mL normal saline Sable Feil, PA-C 07/30/2023 Morning Active             Home Care and Equipment/Supplies: Were Home Health Services Ordered?: Yes Name of Home Health Agency:: [facility] Has Agency set up a time to come to your home?: No (son unsuer if agency has tried to contact his brother or not-will discuss with him- if not they will call agency today to arrange visit) Any new equipment or medical supplies ordered?: No  Functional Questionnaire: Do you need assistance with bathing/showering or dressing?: Yes (family assists with all ADLs/IADLs) Do you need assistance with meal preparation?: Yes Do you need assistance with eating?: No Do you have difficulty maintaining continence: Yes Do you need assistance with getting out of bed/getting out of a chair/moving?: Yes Do you have difficulty managing or taking your medications?: Yes  Follow up appointments reviewed: PCP Follow-up appointment confirmed?: No (son prefers to confer with his siblings regarding arranging appt and then contact provider to arrange) MD Provider Line [phone] Given: No Specialist Hospital Follow-up appointment confirmed?: No Reason Specialist Follow-Up Not Confirmed: Patient has Specialist Provider Number and will Call for Appointment (son is aware to f/u with surgeon if they wish to pursue gallbladder removal surgery-states theyare discussing as a family and will f/u with surgeon, aware to expect call from IR scheduler) Do you need transportation to your follow-up appointment?: No Do you understand care options if your condition(s) worsen?: Yes-patient verbalized understanding  SDOH Interventions Today    Flowsheet Row Most Recent Value  SDOH Interventions   Food Insecurity Interventions Intervention Not Indicated  Housing Interventions Intervention Not Indicated  Transportation Interventions Intervention Not Indicated   Utilities Interventions Intervention Not Indicated      Interventions Today    Flowsheet Row Most Recent Value  Chronic Disease   Chronic disease during today's visit Hypertension (HTN), Atrial Fibrillation (AFib)  General Interventions   General Interventions Discussed/Reviewed General Interventions Discussed, Doctor Visits  Doctor Visits Discussed/Reviewed Doctor Visits Discussed, Specialist, PCP  PCP/Specialist Visits Compliance with follow-up visit  Education Interventions   Education Provided Provided Education  Provided Verbal Education On Nutrition, Medication, When to see the doctor  Nutrition Interventions   Nutrition Discussed/Reviewed Nutrition Discussed, Supplemental nutrition, Fluid intake, Increasing proteins, Decreasing fats, Adding fruits and vegetables  Pharmacy Interventions   Pharmacy Dicussed/Reviewed Pharmacy Topics Discussed, Medications and their functions  Safety Interventions   Safety Discussed/Reviewed Safety Discussed, Home Safety       TOC Interventions Today    Flowsheet Row Most Recent Value  TOC Interventions   TOC Interventions Discussed/Reviewed TOC Interventions Discussed       Antionette Fairy, RN,BSN,CCM RN Care Manager Transitions of Care  [place]  Direct Phone: [phone] Toll Free: [phone] Fax: [phone]

## 2023-08-03 ENCOUNTER — Telehealth: Payer: Self-pay | Admitting: *Deleted

## 2023-08-03 NOTE — Telephone Encounter (Signed)
 Please okay these orders  ?

## 2023-08-03 NOTE — Telephone Encounter (Signed)
 Copied from CRM [id number]. Topic: Clinical - Home Health Verbal Orders >> Aug 02, 2023  3:23 PM Evie NOVAK wrote: Caller/Agency: [facility], [facility]  Callback Number: [phone] Service Requested: Physical Therapy, Skilled Nursing, and home health  Frequency:  called to advise they will not being seeing pt until 08/05/23 per pt's daughter request. There  is a delay in start of care.  Any new concerns about the patient? No

## 2023-08-05 DIAGNOSIS — D509 Iron deficiency anemia, unspecified: Secondary | ICD-10-CM | POA: Diagnosis not present

## 2023-08-05 DIAGNOSIS — I10 Essential (primary) hypertension: Secondary | ICD-10-CM | POA: Diagnosis not present

## 2023-08-05 DIAGNOSIS — E785 Hyperlipidemia, unspecified: Secondary | ICD-10-CM | POA: Diagnosis not present

## 2023-08-05 DIAGNOSIS — K219 Gastro-esophageal reflux disease without esophagitis: Secondary | ICD-10-CM | POA: Diagnosis not present

## 2023-08-05 DIAGNOSIS — Z4803 Encounter for change or removal of drains: Secondary | ICD-10-CM | POA: Diagnosis not present

## 2023-08-05 DIAGNOSIS — F32A Depression, unspecified: Secondary | ICD-10-CM | POA: Diagnosis not present

## 2023-08-05 DIAGNOSIS — I480 Paroxysmal atrial fibrillation: Secondary | ICD-10-CM | POA: Diagnosis not present

## 2023-08-05 DIAGNOSIS — G4700 Insomnia, unspecified: Secondary | ICD-10-CM | POA: Diagnosis not present

## 2023-08-05 DIAGNOSIS — E538 Deficiency of other specified B group vitamins: Secondary | ICD-10-CM | POA: Diagnosis not present

## 2023-08-05 DIAGNOSIS — Z9181 History of falling: Secondary | ICD-10-CM | POA: Diagnosis not present

## 2023-08-05 DIAGNOSIS — K810 Acute cholecystitis: Secondary | ICD-10-CM | POA: Diagnosis not present

## 2023-08-05 DIAGNOSIS — Z8673 Personal history of transient ischemic attack (TIA), and cerebral infarction without residual deficits: Secondary | ICD-10-CM | POA: Diagnosis not present

## 2023-08-06 ENCOUNTER — Telehealth: Payer: Self-pay

## 2023-08-06 ENCOUNTER — Ambulatory Visit: Payer: Self-pay | Admitting: Family Medicine

## 2023-08-06 NOTE — Telephone Encounter (Signed)
 Information collected from son.  Chief Complaint: not eating Symptoms: per son she seems depressed, states she is ready to go Frequency: 12/23 Pertinent Negatives: Patient denies fever, denies SOB.   Disposition: [] ED /[] Urgent Care (no appt availability in office) / [] Appointment(In office/virtual)/ []  [place] [facility]/ [] Home Care/ [] Refused Recommended Disposition /[]  Mobile Bus/ [x]  Follow-up with PCP  Additional Notes: Pt had biliary drain placed. Still having output, advised son to call surgeon and explain current state of drain, increasing drainage. Pt struggling to have a BM, has had small amounts of stool.  Pt is taking stool softener and laxatives. Pt is not eating normal, has expressed to her son that she is ready to go.  Per son he would like palliative or home health help at the mothers house. Advised son that pt should be evaluated in the ED d/t Bps in the high 90s, loss of appetite, and pt being tired. Son states he will attmpet to get pt to go to the ED but is not sure he will be able to get the pt to agree to going to the ED. Pt is already sched for f/u on wed.   Copied from CRM [id number]. Topic: Clinical - Red Word Triage >> Aug 06, 2023  2:54 PM Deaijah H wrote: Red Word that prompted transfer to Nurse Triage: Patient son called in stating patient is depressed and ready to give up, not eating Reason for Disposition  [1] Depression AND [2] worsening (e.g., sleeping poorly, less able to do activities of daily living)  Answer Assessment - Initial Assessment Questions 1. CONCERN: What happened that made you call today?     Son is calling d/t her feeling depressed.  2. DEPRESSION SYMPTOM SCREENING: How are you feeling overall? (e.g., decreased energy, increased sleeping or difficulty sleeping, difficulty concentrating, feelings of sadness, guilt, hopelessness, or worthlessness)     She has been feeling down, and having difficulty sleeping 3. RISK OF HARM -  SUICIDAL IDEATION:  Do you ever have thoughts of hurting or killing yourself?  (e.g., yes, no, no but preoccupation with thoughts about death)   - INTENT:  Do you have thoughts of hurting or killing yourself right NOW? (e.g., yes, no, N/A)   - PLAN: Do you have a specific plan for how you would do this? (e.g., gun, knife, overdose, no plan, N/A)     Per son she has not expressed SI/HI 4. RISK OF HARM - HOMICIDAL IDEATION:  Do you ever have thoughts of hurting or killing someone else?  (e.g., yes, no, no but preoccupation with thoughts about death)   - INTENT:  Do you have thoughts of hurting or killing someone right NOW? (e.g., yes, no, N/A)   - PLAN: Do you have a specific plan for how you would do this? (e.g., gun, knife, no plan, N/A)      Per son she has not expressed SI/HI 5. FUNCTIONAL IMPAIRMENT: How have things been going for you overall? Have you had more difficulty than usual doing your normal daily activities?  (e.g., better, same, worse; self-care, school, work, interactions)     Still doing ADLs, PT is suppose to be coming but hasn't been 6. SUPPORT: Who is with you now? Who do you live with? Do you have family or friends who you can talk to?      Supportive family 8. STRESSORS: Has there been any new stress or recent changes in your life?     Recent surgical procedure 9.  ALCOHOL  USE OR SUBSTANCE USE (DRUG USE): Do you drink alcohol  or use any illegal drugs?     Son states using hydrocodone  to relax and sleep  Protocols used: Depression-A-AH

## 2023-08-06 NOTE — Telephone Encounter (Signed)
 Copied from CRM [id number]. Topic: Clinical - Home Health Verbal Orders >> Aug 06, 2023  8:34 AM Carmell SAUNDERS wrote: Caller/Agency: [facility] Callback Number: [phone] Service Requested: Skilled Nursing Frequency: 1W5 Any new concerns about the patient? No

## 2023-08-07 NOTE — Telephone Encounter (Signed)
Okay for verbal orders? Please advise 

## 2023-08-07 NOTE — Telephone Encounter (Signed)
 No v.o needed Christine Middleton was advising of delay in care.

## 2023-08-07 NOTE — Telephone Encounter (Signed)
 Left a message for Tanya with approval for the VO requested

## 2023-08-07 NOTE — Telephone Encounter (Signed)
 Please okay these orders  ?

## 2023-08-08 ENCOUNTER — Encounter: Payer: Self-pay | Admitting: Family Medicine

## 2023-08-08 ENCOUNTER — Ambulatory Visit (INDEPENDENT_AMBULATORY_CARE_PROVIDER_SITE_OTHER): Payer: Medicare Other | Admitting: Family Medicine

## 2023-08-08 VITALS — BP 102/60 | HR 75 | Temp 98.1°F | Wt 116.2 lb

## 2023-08-08 DIAGNOSIS — K819 Cholecystitis, unspecified: Secondary | ICD-10-CM | POA: Diagnosis not present

## 2023-08-08 LAB — CBC WITH DIFFERENTIAL/PLATELET
Basophils Absolute: 0.1 10*3/uL (ref 0.0–0.1)
Basophils Relative: 0.8 % (ref 0.0–3.0)
Eosinophils Absolute: 0.2 10*3/uL (ref 0.0–0.7)
Eosinophils Relative: 2.3 % (ref 0.0–5.0)
HCT: 33.8 % — ABNORMAL LOW (ref 36.0–46.0)
Hemoglobin: 11.2 g/dL — ABNORMAL LOW (ref 12.0–15.0)
Lymphocytes Relative: 6.6 % — ABNORMAL LOW (ref 12.0–46.0)
Lymphs Abs: 0.7 10*3/uL (ref 0.7–4.0)
MCHC: 33 g/dL (ref 30.0–36.0)
MCV: 91.9 fL (ref 78.0–100.0)
Monocytes Absolute: 1 10*3/uL (ref 0.1–1.0)
Monocytes Relative: 9.6 % (ref 3.0–12.0)
Neutro Abs: 8.7 10*3/uL — ABNORMAL HIGH (ref 1.4–7.7)
Neutrophils Relative %: 80.7 % — ABNORMAL HIGH (ref 43.0–77.0)
Platelets: 363 10*3/uL (ref 150.0–400.0)
RBC: 3.68 Mil/uL — ABNORMAL LOW (ref 3.87–5.11)
RDW: 14 % (ref 11.5–15.5)
WBC: 10.7 10*3/uL — ABNORMAL HIGH (ref 4.0–10.5)

## 2023-08-08 LAB — HEPATIC FUNCTION PANEL
ALT: 18 U/L (ref 0–35)
AST: 19 U/L (ref 0–37)
Albumin: 3.7 g/dL (ref 3.5–5.2)
Alkaline Phosphatase: 165 U/L — ABNORMAL HIGH (ref 39–117)
Bilirubin, Direct: 0.5 mg/dL — ABNORMAL HIGH (ref 0.0–0.3)
Total Bilirubin: 1.2 mg/dL (ref 0.2–1.2)
Total Protein: 7.2 g/dL (ref 6.0–8.3)

## 2023-08-08 LAB — BASIC METABOLIC PANEL
BUN: 34 mg/dL — ABNORMAL HIGH (ref 6–23)
CO2: 22 meq/L (ref 19–32)
Calcium: 9.4 mg/dL (ref 8.4–10.5)
Chloride: 100 meq/L (ref 96–112)
Creatinine, Ser: 1.32 mg/dL — ABNORMAL HIGH (ref 0.40–1.20)
GFR: 33.48 mL/min — ABNORMAL LOW (ref >60.00)
Glucose, Bld: 120 mg/dL — ABNORMAL HIGH (ref 70–99)
Potassium: 4.2 meq/L (ref 3.5–5.1)
Sodium: 131 meq/L — ABNORMAL LOW (ref 135–145)

## 2023-08-08 NOTE — Progress Notes (Signed)
   Subjective:    Patient ID: Christine Middleton, female    DOB: [birth year], 89 y.o.   MRN: [id number]  HPI Here with her son for a transitional care follow up to a hospital stay from 07-23-23 to 07-28-23 for acute cholecystitis. She presented with several days of right sided abdominal pain and nausea with vomiting. No fever. An abdominal CT showed borderline thickening of the gall bladder walls with probable stones inside. Her WBC got as high as 13.1, Hgb was 10.7, creatinine was 0.90, and her transaminases were elevated. The AST got as high as 265, and the ALT got as high as 124. Surgery was consulted and they decided that a surgical procedure would be too risky for her. Instead a cholecystotomy drain was placed by Interventional Radiology, and she was treated with antibiotics. She received IV Rocephin  and Metronidazole  for a few days, and this was followed by oral Keflex  and Metronidazole  for a few days. She never had a fever. Her fluid cultures remained negative. By the time of DC her WBC count was down to 8.4, the AST was down to 27, and the ALT was down to 31. Since going home she has been drinking fluids but her appetite is quite poor. Her son is trying to get as much protein as he can in her, using protein powders in her drinks. The drain was draining 400 cc of fluid a day at first, and this is down to 200 cc a day now. She denies any abdominal pain or nausea. Her Bms are small but regular.    Review of Systems  Constitutional:  Positive for fatigue. Negative for fever.  Respiratory: Negative.    Cardiovascular: Negative.   Gastrointestinal: Negative.   Genitourinary: Negative.   Neurological: Negative.        Objective:   Physical Exam Constitutional:      Comments: In a wheelchair   Cardiovascular:     Rate and Rhythm: Normal rate and regular rhythm.     Pulses: Normal pulses.     Heart sounds: Normal heart sounds.  Pulmonary:     Effort: Pulmonary effort is normal.     Breath sounds:  Normal breath sounds.  Abdominal:     General: Abdomen is flat. Bowel sounds are normal. There is no distension.     Palpations: Abdomen is soft. There is no mass.     Tenderness: There is no abdominal tenderness. There is no right CVA tenderness, left CVA tenderness, guarding or rebound.     Hernia: No hernia is present.     Comments: The drain is in the proper location and draining effectively   Neurological:     Mental Status: She is alert.           Assessment & Plan:  She is recovering from a bout of cholecystitis that has been treated non-surgically with a drain and with antibiotics. The plan is to remove the drain 6-8 weeks after placement, which would be 4-6 weeks from now. We will check a CBC, BMET, and liver panel today. We spent a total of ( 34  ) minutes reviewing records and discussing these issues.  Garnette Olmsted, MD

## 2023-08-09 ENCOUNTER — Telehealth: Payer: Self-pay | Admitting: Family Medicine

## 2023-08-09 ENCOUNTER — Telehealth: Payer: Self-pay | Admitting: *Deleted

## 2023-08-09 NOTE — Telephone Encounter (Signed)
 Left another message for Archie Patten advised the VO have been approved

## 2023-08-09 NOTE — Telephone Encounter (Signed)
 [facility] is calling and pt daughter is calling back and would like blood work result

## 2023-08-09 NOTE — Telephone Encounter (Signed)
 Copied from CRM [id number]. Topic: General - Call Back - No Documentation >> Aug 09, 2023 12:26 PM Truddie Crumble wrote: Reason for CRM: Pt daughter called back stating someone called the pt and she was returning the call to the office

## 2023-08-10 ENCOUNTER — Telehealth: Payer: Self-pay

## 2023-08-10 NOTE — Telephone Encounter (Signed)
 Please okay these orders  ?

## 2023-08-10 NOTE — Telephone Encounter (Signed)
 Copied from CRM [id number]. Topic: Clinical - Home Health Verbal Orders >> Aug 10, 2023 11:53 AM Rosina BIRCH wrote: Caller/Agency: craig from [facility] Callback Number: [phone] Service Requested: Physical Therapy Frequency: one week one for evaluation Any new concerns about the patient? Yes- the daughter stated the pt was depressed over the last couple of weeks that is why the evaluation was delayed

## 2023-08-10 NOTE — Telephone Encounter (Signed)
 Duplicate message.

## 2023-08-10 NOTE — Telephone Encounter (Signed)
 Left a message for pt daughter, advised to contact the office to review pt lab results

## 2023-08-10 NOTE — Telephone Encounter (Signed)
 Copied from CRM [id number]. Topic: Clinical - Lab/Test Results >> Aug 10, 2023 12:00 PM Montie POUR wrote: Reason for CRM: Elveria - daughter - [phone] - She missed a call about Sham's lab results. Elveria is requesting to send the results through MyChart. You can call if needed.

## 2023-08-11 DIAGNOSIS — S72001D Fracture of unspecified part of neck of right femur, subsequent encounter for closed fracture with routine healing: Secondary | ICD-10-CM | POA: Diagnosis not present

## 2023-08-11 DIAGNOSIS — R278 Other lack of coordination: Secondary | ICD-10-CM | POA: Diagnosis not present

## 2023-08-11 DIAGNOSIS — R262 Difficulty in walking, not elsewhere classified: Secondary | ICD-10-CM | POA: Diagnosis not present

## 2023-08-11 DIAGNOSIS — M6281 Muscle weakness (generalized): Secondary | ICD-10-CM | POA: Diagnosis not present

## 2023-08-12 ENCOUNTER — Other Ambulatory Visit: Payer: Self-pay | Admitting: Family Medicine

## 2023-08-14 ENCOUNTER — Telehealth: Payer: Self-pay

## 2023-08-14 DIAGNOSIS — K219 Gastro-esophageal reflux disease without esophagitis: Secondary | ICD-10-CM | POA: Diagnosis not present

## 2023-08-14 DIAGNOSIS — K810 Acute cholecystitis: Secondary | ICD-10-CM | POA: Diagnosis not present

## 2023-08-14 DIAGNOSIS — E538 Deficiency of other specified B group vitamins: Secondary | ICD-10-CM | POA: Diagnosis not present

## 2023-08-14 DIAGNOSIS — Z9181 History of falling: Secondary | ICD-10-CM | POA: Diagnosis not present

## 2023-08-14 DIAGNOSIS — F32A Depression, unspecified: Secondary | ICD-10-CM | POA: Diagnosis not present

## 2023-08-14 DIAGNOSIS — Z4803 Encounter for change or removal of drains: Secondary | ICD-10-CM | POA: Diagnosis not present

## 2023-08-14 DIAGNOSIS — E785 Hyperlipidemia, unspecified: Secondary | ICD-10-CM | POA: Diagnosis not present

## 2023-08-14 DIAGNOSIS — I480 Paroxysmal atrial fibrillation: Secondary | ICD-10-CM | POA: Diagnosis not present

## 2023-08-14 DIAGNOSIS — Z8673 Personal history of transient ischemic attack (TIA), and cerebral infarction without residual deficits: Secondary | ICD-10-CM | POA: Diagnosis not present

## 2023-08-14 DIAGNOSIS — I10 Essential (primary) hypertension: Secondary | ICD-10-CM | POA: Diagnosis not present

## 2023-08-14 DIAGNOSIS — G4700 Insomnia, unspecified: Secondary | ICD-10-CM | POA: Diagnosis not present

## 2023-08-14 DIAGNOSIS — D509 Iron deficiency anemia, unspecified: Secondary | ICD-10-CM | POA: Diagnosis not present

## 2023-08-14 NOTE — Telephone Encounter (Signed)
 Copied from CRM [id number]. Topic: Clinical - Home Health Verbal Orders >> Aug 14, 2023  3:31 PM Graeme ORN wrote: Caller/Agency: Lele with [facility]  Callback Number: [phone] Service Requested: Caller had visit with patient and is requesting provider prescribe patient something for pain and nausea.  Frequency: N/A Any new concerns about the patient? Increased pain and son would like to discuss palliative or hospice care and caller wanted to know if they could be referred for an assessment.

## 2023-08-14 NOTE — Telephone Encounter (Signed)
 Sent pt results via MyChart per pt daughter request

## 2023-08-15 DIAGNOSIS — Z4803 Encounter for change or removal of drains: Secondary | ICD-10-CM | POA: Diagnosis not present

## 2023-08-15 DIAGNOSIS — K219 Gastro-esophageal reflux disease without esophagitis: Secondary | ICD-10-CM

## 2023-08-15 DIAGNOSIS — Z9181 History of falling: Secondary | ICD-10-CM

## 2023-08-15 DIAGNOSIS — F32A Depression, unspecified: Secondary | ICD-10-CM

## 2023-08-15 DIAGNOSIS — G4700 Insomnia, unspecified: Secondary | ICD-10-CM

## 2023-08-15 DIAGNOSIS — Z8673 Personal history of transient ischemic attack (TIA), and cerebral infarction without residual deficits: Secondary | ICD-10-CM

## 2023-08-15 DIAGNOSIS — E538 Deficiency of other specified B group vitamins: Secondary | ICD-10-CM

## 2023-08-15 DIAGNOSIS — I480 Paroxysmal atrial fibrillation: Secondary | ICD-10-CM | POA: Diagnosis not present

## 2023-08-15 DIAGNOSIS — E785 Hyperlipidemia, unspecified: Secondary | ICD-10-CM

## 2023-08-15 DIAGNOSIS — I10 Essential (primary) hypertension: Secondary | ICD-10-CM | POA: Diagnosis not present

## 2023-08-15 DIAGNOSIS — K810 Acute cholecystitis: Secondary | ICD-10-CM | POA: Diagnosis not present

## 2023-08-15 DIAGNOSIS — D509 Iron deficiency anemia, unspecified: Secondary | ICD-10-CM

## 2023-08-17 ENCOUNTER — Other Ambulatory Visit (HOSPITAL_COMMUNITY): Payer: Self-pay

## 2023-08-17 MED ORDER — HYDROCODONE-ACETAMINOPHEN 5-325 MG PO TABS: 1.0000 | ORAL_TABLET | Freq: Four times a day (QID) | ORAL | 0 refills | Status: DC | PRN

## 2023-08-17 MED ORDER — ONDANSETRON HCL 8 MG PO TABS: 8.0000 mg | ORAL_TABLET | Freq: Three times a day (TID) | ORAL | 0 refills | Status: DC | PRN

## 2023-08-17 NOTE — Telephone Encounter (Signed)
I sent in more meds for pain and nausea. We will refer her to Hospice, I think that is a good idea

## 2023-08-17 NOTE — Telephone Encounter (Signed)
Spoke with pt daughter, advised that Rx for nausea and that Dr Clent Ridges agrees for pt to get Palliative care. Also I advised pt daughter to have her brother to come by the office and sign a DPR for release of information

## 2023-08-17 NOTE — Addendum Note (Signed)
Addended by: Gershon Crane A on: 08/17/2023 05:35 PM   Modules accepted: Orders

## 2023-08-17 NOTE — Telephone Encounter (Signed)
Left detailed message for Tasia Catchings with [facility] advised of approval for VO requsted

## 2023-08-21 ENCOUNTER — Telehealth: Payer: Self-pay

## 2023-08-21 DIAGNOSIS — E538 Deficiency of other specified B group vitamins: Secondary | ICD-10-CM | POA: Diagnosis not present

## 2023-08-21 DIAGNOSIS — G4700 Insomnia, unspecified: Secondary | ICD-10-CM | POA: Diagnosis not present

## 2023-08-21 DIAGNOSIS — E785 Hyperlipidemia, unspecified: Secondary | ICD-10-CM | POA: Diagnosis not present

## 2023-08-21 DIAGNOSIS — K810 Acute cholecystitis: Secondary | ICD-10-CM | POA: Diagnosis not present

## 2023-08-21 DIAGNOSIS — F32A Depression, unspecified: Secondary | ICD-10-CM | POA: Diagnosis not present

## 2023-08-21 DIAGNOSIS — Z4803 Encounter for change or removal of drains: Secondary | ICD-10-CM | POA: Diagnosis not present

## 2023-08-21 DIAGNOSIS — K219 Gastro-esophageal reflux disease without esophagitis: Secondary | ICD-10-CM | POA: Diagnosis not present

## 2023-08-21 DIAGNOSIS — D509 Iron deficiency anemia, unspecified: Secondary | ICD-10-CM | POA: Diagnosis not present

## 2023-08-21 DIAGNOSIS — I10 Essential (primary) hypertension: Secondary | ICD-10-CM | POA: Diagnosis not present

## 2023-08-21 DIAGNOSIS — Z8673 Personal history of transient ischemic attack (TIA), and cerebral infarction without residual deficits: Secondary | ICD-10-CM | POA: Diagnosis not present

## 2023-08-21 DIAGNOSIS — I480 Paroxysmal atrial fibrillation: Secondary | ICD-10-CM | POA: Diagnosis not present

## 2023-08-21 DIAGNOSIS — Z9181 History of falling: Secondary | ICD-10-CM | POA: Diagnosis not present

## 2023-08-21 NOTE — Telephone Encounter (Signed)
Referral to Hospice palliative placed

## 2023-08-21 NOTE — Progress Notes (Unsigned)
Referral to Hospice Palliative placed with [facility]

## 2023-08-21 NOTE — Telephone Encounter (Signed)
Copied from CRM [id number]. Topic: Clinical - Home Health Verbal Orders >> Aug 21, 2023  2:19 PM Fredrich Romans wrote: Caller/Agency[facility]rie Jeans [facility] Callback Number: [phone]  Any new concerns about the patient? Yes They did not pick her up for Nursing due to family looking in to hospice for her

## 2023-08-22 NOTE — Telephone Encounter (Signed)
FYI

## 2023-08-27 NOTE — Telephone Encounter (Signed)
Spoke with Christine Middleton stated that he received the VO approval

## 2023-09-11 DIAGNOSIS — S72001D Fracture of unspecified part of neck of right femur, subsequent encounter for closed fracture with routine healing: Secondary | ICD-10-CM | POA: Diagnosis not present

## 2023-09-11 DIAGNOSIS — M6281 Muscle weakness (generalized): Secondary | ICD-10-CM | POA: Diagnosis not present

## 2023-09-11 DIAGNOSIS — R278 Other lack of coordination: Secondary | ICD-10-CM | POA: Diagnosis not present

## 2023-09-11 DIAGNOSIS — R262 Difficulty in walking, not elsewhere classified: Secondary | ICD-10-CM | POA: Diagnosis not present

## 2023-09-26 ENCOUNTER — Telehealth: Payer: Self-pay | Admitting: Family Medicine

## 2023-09-26 NOTE — Telephone Encounter (Signed)
 I am sorry to hear that. Please get more information about how she passed as well as the date and time she was pronounced dead

## 2023-09-26 NOTE — Telephone Encounter (Signed)
 Copied from CRM [id number]. Topic: General - Other >> Sep 26, 2023 11:19 AM Ernst Spell wrote: Reason for CRM: pt's daughter Suzan Garibaldi called to inform Dr. Clent Ridges that the patient has sadly passed away. She is unsure of the next steps regarding her mother's health information and is seeking guidance.   Additionally, she wanted to let Dr. Clent Ridges know that her mother truly admired him and appreciated his work as her primary doctor.   Please advise.

## 2023-09-27 NOTE — Telephone Encounter (Signed)
 Spoke with pt son who state that pt passed away on September 23, 2023 at home. States that pt daughter will call the office back with time of death.

## 2023-09-29 DEATH — deceased

## 2023-10-03 ENCOUNTER — Encounter: Payer: Self-pay | Admitting: Family Medicine

## 2023-10-03 NOTE — Telephone Encounter (Signed)
 I have already changed the death certificate to say August 13, 2023 at 12:30 am (as was requested). I cannot change it any more

## 2023-10-04 NOTE — Telephone Encounter (Signed)
 Noted.

## 2023-10-04 NOTE — Telephone Encounter (Signed)
 Please see my answer to her other message this morning.

## 2023-10-04 NOTE — Telephone Encounter (Signed)
 I spoke to her daughter, Christine Middleton, and we resolved this mix up. When I was asked about possibly changing a death certificate, I misunderstood. I thought the message was referring to a different patient of mine where I did in fact recently change the date and time of her death on her death certificate. My response was this could not be changed again, so this was relayed to [place]. Of course she thought the message was about her mother. I explained the mix up, and reassured her that the original death certificate that was fill out by the Hospice provider about her mother is still intact and will not be changed. She said she understands, and she feels much better.

## 2023-10-04 NOTE — Telephone Encounter (Signed)
 Pt daughter Suzan Garibaldi called back and stated that there was a mix up on what the previous agent provided concerning her mother's death and a issue concerning her death certificate.   Pt would like Harriett Sine to give her a call back hopefully sometime today.

## 2023-10-11 ENCOUNTER — Other Ambulatory Visit: Payer: Self-pay | Admitting: Family Medicine

## 2024-06-02 ENCOUNTER — Encounter: Payer: Self-pay | Admitting: Radiology

## 2024-07-22 ENCOUNTER — Encounter: Payer: Self-pay | Admitting: Urology
